# Patient Record
Sex: Female | Born: 1935 | Race: White | Hispanic: No | Marital: Married | Smoking: Never smoker
Health system: Southern US, Community
[De-identification: ages and names within clinical notes are randomized; demographics above are authoritative.]

## PROBLEM LIST (undated history)

## (undated) DIAGNOSIS — E538 Deficiency of other specified B group vitamins: Secondary | ICD-10-CM

## (undated) DIAGNOSIS — Z923 Personal history of irradiation: Secondary | ICD-10-CM

## (undated) DIAGNOSIS — I10 Essential (primary) hypertension: Secondary | ICD-10-CM

## (undated) DIAGNOSIS — R011 Cardiac murmur, unspecified: Secondary | ICD-10-CM

## (undated) DIAGNOSIS — K219 Gastro-esophageal reflux disease without esophagitis: Secondary | ICD-10-CM

## (undated) DIAGNOSIS — K635 Polyp of colon: Secondary | ICD-10-CM

## (undated) DIAGNOSIS — D649 Anemia, unspecified: Secondary | ICD-10-CM

## (undated) DIAGNOSIS — E042 Nontoxic multinodular goiter: Secondary | ICD-10-CM

## (undated) DIAGNOSIS — Z950 Presence of cardiac pacemaker: Secondary | ICD-10-CM

## (undated) DIAGNOSIS — M810 Age-related osteoporosis without current pathological fracture: Secondary | ICD-10-CM

## (undated) DIAGNOSIS — E785 Hyperlipidemia, unspecified: Secondary | ICD-10-CM

## (undated) DIAGNOSIS — T7840XA Allergy, unspecified, initial encounter: Secondary | ICD-10-CM

## (undated) DIAGNOSIS — K589 Irritable bowel syndrome without diarrhea: Secondary | ICD-10-CM

## (undated) DIAGNOSIS — M546 Pain in thoracic spine: Secondary | ICD-10-CM

## (undated) DIAGNOSIS — C50912 Malignant neoplasm of unspecified site of left female breast: Secondary | ICD-10-CM

## (undated) DIAGNOSIS — Z8719 Personal history of other diseases of the digestive system: Secondary | ICD-10-CM

## (undated) DIAGNOSIS — G8929 Other chronic pain: Secondary | ICD-10-CM

## (undated) DIAGNOSIS — I509 Heart failure, unspecified: Secondary | ICD-10-CM

## (undated) HISTORY — DX: Hyperlipidemia, unspecified: E78.5

## (undated) HISTORY — PX: BREAST SURGERY: SHX581

## (undated) HISTORY — DX: Allergy, unspecified, initial encounter: T78.40XA

## (undated) HISTORY — PX: AUGMENTATION MAMMAPLASTY: SUR837

## (undated) HISTORY — DX: Irritable bowel syndrome, unspecified: K58.9

## (undated) HISTORY — DX: Deficiency of other specified B group vitamins: E53.8

## (undated) HISTORY — DX: Gastro-esophageal reflux disease without esophagitis: K21.9

## (undated) HISTORY — DX: Anemia, unspecified: D64.9

## (undated) HISTORY — PX: BACK SURGERY: SHX140

## (undated) HISTORY — DX: Polyp of colon: K63.5

## (undated) HISTORY — PX: INSERT / REPLACE / REMOVE PACEMAKER: SUR710

## (undated) HISTORY — PX: TUBAL LIGATION: SHX77

## (undated) HISTORY — PX: REDUCTION MAMMAPLASTY: SUR839

## (undated) HISTORY — PX: KNEE ARTHROSCOPY: SHX127

## (undated) HISTORY — DX: Essential (primary) hypertension: I10

## (undated) HISTORY — DX: Heart failure, unspecified: I50.9

---

## 1976-07-28 DIAGNOSIS — C50912 Malignant neoplasm of unspecified site of left female breast: Secondary | ICD-10-CM

## 1976-07-28 HISTORY — PX: MASTECTOMY: SHX3

## 1976-07-28 HISTORY — DX: Malignant neoplasm of unspecified site of left female breast: C50.912

## 1979-07-29 HISTORY — PX: PLACEMENT OF BREAST IMPLANTS: SHX6334

## 1982-07-28 HISTORY — PX: EXCISIONAL HEMORRHOIDECTOMY: SHX1541

## 1998-04-25 ENCOUNTER — Other Ambulatory Visit: Admission: RE | Admit: 1998-04-25 | Discharge: 1998-04-25 | Payer: Self-pay | Admitting: *Deleted

## 1998-06-27 ENCOUNTER — Other Ambulatory Visit: Admission: RE | Admit: 1998-06-27 | Discharge: 1998-06-27 | Payer: Self-pay | Admitting: Gastroenterology

## 1998-06-27 ENCOUNTER — Encounter: Payer: Self-pay | Admitting: Gastroenterology

## 1998-06-29 ENCOUNTER — Encounter: Payer: Self-pay | Admitting: Gastroenterology

## 1998-06-29 ENCOUNTER — Ambulatory Visit (HOSPITAL_COMMUNITY): Admission: RE | Admit: 1998-06-29 | Discharge: 1998-06-29 | Payer: Self-pay | Admitting: Gastroenterology

## 1998-12-25 ENCOUNTER — Ambulatory Visit (HOSPITAL_COMMUNITY): Admission: RE | Admit: 1998-12-25 | Discharge: 1998-12-25 | Payer: Self-pay | Admitting: Obstetrics & Gynecology

## 1999-04-30 ENCOUNTER — Other Ambulatory Visit: Admission: RE | Admit: 1999-04-30 | Discharge: 1999-04-30 | Payer: Self-pay | Admitting: *Deleted

## 1999-07-25 ENCOUNTER — Encounter: Payer: Self-pay | Admitting: *Deleted

## 1999-07-25 ENCOUNTER — Encounter: Admission: RE | Admit: 1999-07-25 | Discharge: 1999-07-25 | Payer: Self-pay | Admitting: *Deleted

## 1999-07-29 HISTORY — PX: CARDIAC CATHETERIZATION: SHX172

## 2000-04-27 ENCOUNTER — Encounter: Admission: RE | Admit: 2000-04-27 | Discharge: 2000-04-27 | Payer: Self-pay | Admitting: *Deleted

## 2000-04-27 ENCOUNTER — Encounter: Payer: Self-pay | Admitting: *Deleted

## 2000-05-11 ENCOUNTER — Encounter: Admission: RE | Admit: 2000-05-11 | Discharge: 2000-05-11 | Payer: Self-pay | Admitting: *Deleted

## 2000-05-11 ENCOUNTER — Encounter: Payer: Self-pay | Admitting: *Deleted

## 2000-05-31 ENCOUNTER — Encounter: Payer: Self-pay | Admitting: *Deleted

## 2000-05-31 ENCOUNTER — Observation Stay (HOSPITAL_COMMUNITY): Admission: EM | Admit: 2000-05-31 | Discharge: 2000-06-01 | Payer: Self-pay | Admitting: *Deleted

## 2000-06-08 ENCOUNTER — Other Ambulatory Visit: Admission: RE | Admit: 2000-06-08 | Discharge: 2000-06-08 | Payer: Self-pay | Admitting: Obstetrics and Gynecology

## 2000-07-27 ENCOUNTER — Encounter: Payer: Self-pay | Admitting: Internal Medicine

## 2000-07-27 ENCOUNTER — Encounter: Admission: RE | Admit: 2000-07-27 | Discharge: 2000-07-27 | Payer: Self-pay | Admitting: Internal Medicine

## 2001-05-26 ENCOUNTER — Encounter: Payer: Self-pay | Admitting: Gastroenterology

## 2001-07-29 ENCOUNTER — Encounter: Admission: RE | Admit: 2001-07-29 | Discharge: 2001-07-29 | Payer: Self-pay | Admitting: Internal Medicine

## 2001-07-29 ENCOUNTER — Encounter: Payer: Self-pay | Admitting: Internal Medicine

## 2001-08-02 ENCOUNTER — Other Ambulatory Visit: Admission: RE | Admit: 2001-08-02 | Discharge: 2001-08-02 | Payer: Self-pay | Admitting: Obstetrics and Gynecology

## 2001-11-29 ENCOUNTER — Encounter: Payer: Self-pay | Admitting: Internal Medicine

## 2001-11-29 ENCOUNTER — Inpatient Hospital Stay (HOSPITAL_COMMUNITY): Admission: AD | Admit: 2001-11-29 | Discharge: 2001-12-01 | Payer: Self-pay | Admitting: Internal Medicine

## 2001-12-21 ENCOUNTER — Encounter: Payer: Self-pay | Admitting: Internal Medicine

## 2001-12-21 ENCOUNTER — Ambulatory Visit (HOSPITAL_COMMUNITY): Admission: RE | Admit: 2001-12-21 | Discharge: 2001-12-21 | Payer: Self-pay | Admitting: Internal Medicine

## 2002-03-30 ENCOUNTER — Encounter: Payer: Self-pay | Admitting: Internal Medicine

## 2002-03-30 ENCOUNTER — Encounter: Admission: RE | Admit: 2002-03-30 | Discharge: 2002-03-30 | Payer: Self-pay | Admitting: Internal Medicine

## 2002-08-05 ENCOUNTER — Encounter: Admission: RE | Admit: 2002-08-05 | Discharge: 2002-08-05 | Payer: Self-pay | Admitting: Internal Medicine

## 2002-08-05 ENCOUNTER — Encounter: Payer: Self-pay | Admitting: Internal Medicine

## 2002-09-28 ENCOUNTER — Other Ambulatory Visit: Admission: RE | Admit: 2002-09-28 | Discharge: 2002-09-28 | Payer: Self-pay | Admitting: Obstetrics and Gynecology

## 2003-09-11 ENCOUNTER — Encounter: Admission: RE | Admit: 2003-09-11 | Discharge: 2003-09-11 | Payer: Self-pay | Admitting: Internal Medicine

## 2004-08-04 ENCOUNTER — Encounter: Admission: RE | Admit: 2004-08-04 | Discharge: 2004-08-04 | Payer: Self-pay | Admitting: Orthopedic Surgery

## 2004-10-01 ENCOUNTER — Encounter: Admission: RE | Admit: 2004-10-01 | Discharge: 2004-10-01 | Payer: Self-pay | Admitting: Internal Medicine

## 2004-10-24 ENCOUNTER — Encounter: Admission: RE | Admit: 2004-10-24 | Discharge: 2004-10-24 | Payer: Self-pay | Admitting: Orthopedic Surgery

## 2004-12-11 ENCOUNTER — Ambulatory Visit: Payer: Self-pay | Admitting: Gastroenterology

## 2004-12-13 ENCOUNTER — Encounter (INDEPENDENT_AMBULATORY_CARE_PROVIDER_SITE_OTHER): Payer: Self-pay | Admitting: *Deleted

## 2004-12-13 ENCOUNTER — Ambulatory Visit: Payer: Self-pay | Admitting: Gastroenterology

## 2005-04-16 ENCOUNTER — Ambulatory Visit: Payer: Self-pay | Admitting: Gastroenterology

## 2005-09-02 ENCOUNTER — Encounter: Admission: RE | Admit: 2005-09-02 | Discharge: 2005-09-02 | Payer: Self-pay | Admitting: Neurology

## 2005-09-15 ENCOUNTER — Encounter: Admission: RE | Admit: 2005-09-15 | Discharge: 2005-10-06 | Payer: Self-pay | Admitting: Neurology

## 2005-10-15 ENCOUNTER — Encounter: Admission: RE | Admit: 2005-10-15 | Discharge: 2005-10-15 | Payer: Self-pay | Admitting: *Deleted

## 2005-10-29 ENCOUNTER — Encounter: Admission: RE | Admit: 2005-10-29 | Discharge: 2005-10-29 | Payer: Self-pay | Admitting: *Deleted

## 2005-11-20 ENCOUNTER — Encounter: Admission: RE | Admit: 2005-11-20 | Discharge: 2005-11-20 | Payer: Self-pay | Admitting: Orthopedic Surgery

## 2006-02-24 ENCOUNTER — Encounter: Admission: RE | Admit: 2006-02-24 | Discharge: 2006-02-24 | Payer: Self-pay | Admitting: Orthopedic Surgery

## 2006-02-25 HISTORY — PX: SHOULDER ARTHROSCOPY W/ ROTATOR CUFF REPAIR: SHX2400

## 2006-03-26 ENCOUNTER — Ambulatory Visit (HOSPITAL_COMMUNITY): Admission: RE | Admit: 2006-03-26 | Discharge: 2006-03-27 | Payer: Self-pay | Admitting: Orthopedic Surgery

## 2006-05-08 ENCOUNTER — Encounter: Admission: RE | Admit: 2006-05-08 | Discharge: 2006-05-08 | Payer: Self-pay | Admitting: Orthopedic Surgery

## 2006-06-17 ENCOUNTER — Encounter
Admission: RE | Admit: 2006-06-17 | Discharge: 2006-06-17 | Payer: Self-pay | Admitting: Physical Medicine and Rehabilitation

## 2006-07-16 ENCOUNTER — Ambulatory Visit: Payer: Self-pay | Admitting: Gastroenterology

## 2006-07-28 HISTORY — PX: ANTERIOR CERVICAL DECOMP/DISCECTOMY FUSION: SHX1161

## 2006-08-10 ENCOUNTER — Ambulatory Visit: Payer: Self-pay | Admitting: Gastroenterology

## 2006-08-18 ENCOUNTER — Ambulatory Visit: Payer: Self-pay | Admitting: Gastroenterology

## 2006-08-18 ENCOUNTER — Encounter (INDEPENDENT_AMBULATORY_CARE_PROVIDER_SITE_OTHER): Payer: Self-pay | Admitting: *Deleted

## 2006-08-18 LAB — HM COLONOSCOPY

## 2006-10-23 ENCOUNTER — Encounter: Admission: RE | Admit: 2006-10-23 | Discharge: 2006-10-23 | Payer: Self-pay | Admitting: *Deleted

## 2006-11-10 ENCOUNTER — Inpatient Hospital Stay (HOSPITAL_COMMUNITY): Admission: RE | Admit: 2006-11-10 | Discharge: 2006-11-11 | Payer: Self-pay | Admitting: Neurosurgery

## 2007-08-31 ENCOUNTER — Encounter: Admission: RE | Admit: 2007-08-31 | Discharge: 2007-08-31 | Payer: Self-pay | Admitting: *Deleted

## 2007-09-13 ENCOUNTER — Ambulatory Visit: Payer: Self-pay | Admitting: Vascular Surgery

## 2007-09-23 ENCOUNTER — Ambulatory Visit: Payer: Self-pay | Admitting: Gastroenterology

## 2007-11-09 ENCOUNTER — Encounter: Admission: RE | Admit: 2007-11-09 | Discharge: 2007-11-09 | Payer: Self-pay | Admitting: *Deleted

## 2007-11-19 ENCOUNTER — Encounter: Admission: RE | Admit: 2007-11-19 | Discharge: 2007-11-19 | Payer: Self-pay | Admitting: *Deleted

## 2007-12-08 ENCOUNTER — Encounter: Admission: RE | Admit: 2007-12-08 | Discharge: 2007-12-08 | Payer: Self-pay | Admitting: *Deleted

## 2007-12-08 ENCOUNTER — Encounter: Payer: Self-pay | Admitting: Internal Medicine

## 2008-02-07 ENCOUNTER — Telehealth: Payer: Self-pay | Admitting: Gastroenterology

## 2008-05-08 ENCOUNTER — Encounter: Admission: RE | Admit: 2008-05-08 | Discharge: 2008-05-08 | Payer: Self-pay | Admitting: *Deleted

## 2008-11-20 ENCOUNTER — Encounter: Admission: RE | Admit: 2008-11-20 | Discharge: 2008-11-20 | Payer: Self-pay | Admitting: *Deleted

## 2009-02-08 ENCOUNTER — Encounter: Payer: Self-pay | Admitting: Internal Medicine

## 2009-02-19 ENCOUNTER — Telehealth: Payer: Self-pay | Admitting: Gastroenterology

## 2009-02-28 ENCOUNTER — Encounter: Admission: RE | Admit: 2009-02-28 | Discharge: 2009-02-28 | Payer: Self-pay | Admitting: Neurosurgery

## 2009-03-06 DIAGNOSIS — Z860100 Personal history of colon polyps, unspecified: Secondary | ICD-10-CM | POA: Insufficient documentation

## 2009-03-08 ENCOUNTER — Ambulatory Visit: Payer: Self-pay | Admitting: Gastroenterology

## 2009-03-08 DIAGNOSIS — R141 Gas pain: Secondary | ICD-10-CM | POA: Insufficient documentation

## 2009-03-08 DIAGNOSIS — K5900 Constipation, unspecified: Secondary | ICD-10-CM | POA: Insufficient documentation

## 2009-03-08 DIAGNOSIS — R142 Eructation: Secondary | ICD-10-CM

## 2009-03-08 DIAGNOSIS — R110 Nausea: Secondary | ICD-10-CM | POA: Insufficient documentation

## 2009-03-08 DIAGNOSIS — R143 Flatulence: Secondary | ICD-10-CM

## 2009-03-09 ENCOUNTER — Ambulatory Visit (HOSPITAL_COMMUNITY): Admission: RE | Admit: 2009-03-09 | Discharge: 2009-03-09 | Payer: Self-pay | Admitting: Gastroenterology

## 2009-03-13 ENCOUNTER — Encounter: Payer: Self-pay | Admitting: Internal Medicine

## 2009-03-27 ENCOUNTER — Telehealth: Payer: Self-pay | Admitting: Internal Medicine

## 2009-04-17 ENCOUNTER — Ambulatory Visit: Payer: Self-pay | Admitting: Internal Medicine

## 2009-04-17 DIAGNOSIS — E785 Hyperlipidemia, unspecified: Secondary | ICD-10-CM | POA: Insufficient documentation

## 2009-04-17 DIAGNOSIS — M810 Age-related osteoporosis without current pathological fracture: Secondary | ICD-10-CM | POA: Insufficient documentation

## 2009-04-17 DIAGNOSIS — Z853 Personal history of malignant neoplasm of breast: Secondary | ICD-10-CM | POA: Insufficient documentation

## 2009-04-17 DIAGNOSIS — E538 Deficiency of other specified B group vitamins: Secondary | ICD-10-CM | POA: Insufficient documentation

## 2009-04-18 ENCOUNTER — Telehealth: Payer: Self-pay | Admitting: Internal Medicine

## 2009-04-18 LAB — CONVERTED CEMR LAB
Cholesterol: 251 mg/dL — ABNORMAL HIGH (ref 0–200)
Folate: 18.4 ng/mL
HDL: 57.5 mg/dL (ref >39.00)
Triglycerides: 136 mg/dL (ref 0.0–149.0)
VLDL: 27.2 mg/dL (ref 0.0–40.0)

## 2009-07-09 ENCOUNTER — Ambulatory Visit: Payer: Self-pay | Admitting: Internal Medicine

## 2009-08-28 ENCOUNTER — Ambulatory Visit: Payer: Self-pay | Admitting: Internal Medicine

## 2009-08-28 LAB — CONVERTED CEMR LAB: Vitamin B-12: >1500 pg/mL — ABNORMAL HIGH (ref 211–911)

## 2009-09-03 ENCOUNTER — Telehealth: Payer: Self-pay | Admitting: Internal Medicine

## 2009-09-28 ENCOUNTER — Ambulatory Visit: Payer: Self-pay | Admitting: Internal Medicine

## 2009-09-28 DIAGNOSIS — R5383 Other fatigue: Secondary | ICD-10-CM | POA: Insufficient documentation

## 2009-09-28 DIAGNOSIS — R5381 Other malaise: Secondary | ICD-10-CM | POA: Insufficient documentation

## 2009-09-28 LAB — CONVERTED CEMR LAB
ALT: 14 units/L (ref 0–35)
BUN: 14 mg/dL (ref 6–23)
Basophils Absolute: 0.1 10*3/uL (ref 0.0–0.1)
Basophils Relative: 0.9 % (ref 0.0–3.0)
Bilirubin, Direct: 0.1 mg/dL (ref 0.0–0.3)
CO2: 29 meq/L (ref 19–32)
Chloride: 104 meq/L (ref 96–112)
Creatinine, Ser: 0.8 mg/dL (ref 0.4–1.2)
Eosinophils Absolute: 0.1 10*3/uL (ref 0.0–0.7)
Glucose, Bld: 101 mg/dL — ABNORMAL HIGH (ref 70–99)
Lymphocytes Relative: 26.8 % (ref 12.0–46.0)
MCHC: 33.6 g/dL (ref 30.0–36.0)
Neutrophils Relative %: 59.5 % (ref 43.0–77.0)
RBC: 3.88 M/uL (ref 3.87–5.11)
Total Protein: 7.1 g/dL (ref 6.0–8.3)

## 2009-11-22 ENCOUNTER — Encounter: Admission: RE | Admit: 2009-11-22 | Discharge: 2009-11-22 | Payer: Self-pay | Admitting: Internal Medicine

## 2009-12-10 ENCOUNTER — Encounter: Payer: Self-pay | Admitting: Internal Medicine

## 2009-12-10 ENCOUNTER — Ambulatory Visit: Payer: Self-pay | Admitting: Internal Medicine

## 2010-01-08 ENCOUNTER — Ambulatory Visit: Payer: Self-pay | Admitting: Internal Medicine

## 2010-01-08 LAB — CONVERTED CEMR LAB
Cholesterol: 263 mg/dL — ABNORMAL HIGH (ref 0–200)
Total CHOL/HDL Ratio: 4
Triglycerides: 92 mg/dL (ref 0.0–149.0)

## 2010-03-04 ENCOUNTER — Ambulatory Visit: Payer: Self-pay | Admitting: Internal Medicine

## 2010-03-04 DIAGNOSIS — R079 Chest pain, unspecified: Secondary | ICD-10-CM | POA: Insufficient documentation

## 2010-07-02 ENCOUNTER — Ambulatory Visit: Payer: Self-pay | Admitting: Internal Medicine

## 2010-07-09 ENCOUNTER — Ambulatory Visit: Payer: Self-pay | Admitting: Internal Medicine

## 2010-07-23 ENCOUNTER — Ambulatory Visit: Payer: Self-pay | Admitting: Internal Medicine

## 2010-07-23 DIAGNOSIS — R040 Epistaxis: Secondary | ICD-10-CM | POA: Insufficient documentation

## 2010-08-18 ENCOUNTER — Encounter: Payer: Self-pay | Admitting: Physical Medicine and Rehabilitation

## 2010-08-18 ENCOUNTER — Encounter: Payer: Self-pay | Admitting: Unknown Physician Specialty

## 2010-08-19 ENCOUNTER — Encounter: Payer: Self-pay | Admitting: Internal Medicine

## 2010-08-28 ENCOUNTER — Encounter: Payer: Self-pay | Admitting: Internal Medicine

## 2010-08-28 ENCOUNTER — Ambulatory Visit (INDEPENDENT_AMBULATORY_CARE_PROVIDER_SITE_OTHER): Payer: Medicare Other | Admitting: Internal Medicine

## 2010-08-28 DIAGNOSIS — J209 Acute bronchitis, unspecified: Secondary | ICD-10-CM

## 2010-08-29 NOTE — Assessment & Plan Note (Signed)
Summary: fell and hit ribs-lb   Vital Signs:  Patient profile:   76 year old female Height:      62.5 inches (158.75 cm) Weight:      160 pounds (72.73 kg) O2 Sat:      97 % on Room air Temp:     97.6 degrees F (36.44 degrees C) oral Pulse rate:   67 / minute BP sitting:   142 / 72  (right arm) Cuff size:   regular  Vitals Entered By: Orlan Leavens RMA (March 04, 2010 11:28 AM)  O2 Flow:  Room air CC: (R) side & ribs hurt Is Patient Diabetic? No Pain Assessment Patient in pain? yes     Location: (R) side & ribs Type: sore/aching Comments Pt states she fell on  Thursday. went to sit on corner of bed and fell on her (R) side. since then ribs has been hurting   Primary Care Provider:  Newt Lukes MD  CC:  (R) side & ribs hurt.  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 Russella Dar for same better when taking nexium -  but trying not to take nexium b/c concerned this pill blocking b12 absorbtion or causing bone loss- occ use of "lower dose prilosec 20mg " if needed and nexium only if "real bad" using Tums too and ?if this  blocks absorbtion   b12 defic - dx 02/08/09 by lab = 178 at [facility] (labs reviewed) has not had shot - no pernicious anemia per GI (per pt)  hx dyslipidemia- adv reaction to lipitor in past requiring hospitalization (?rhabdo) prev taking fish oil and red Torsha Lemus yeast but has stopped all meds few months ago to try to improve b12 absorbtion - now takes intermittently  osteoporosis hx- no personal fx hx no bone or back or joint pain at this time bone scan reviewed from 2009 and 12/21/09 - rec prolia q 6 mo - pt declines due to risk  Current Medications (verified): 1)  Nexium 40 Mg  Cpdr (Esomeprazole Magnesium) .... One Tablet By Mouth Once Daily 2)  Aspir-Low 81 Mg Tbec (Aspirin) .... Take 1 Tablet By Mouth Once A Day 3)  Vitamin B-12 1000 Mcg Tabs (Cyanocobalamin) .... Take 2 Tablet By Mouth Once A Day 4)  Miralax  Powd (Polyethylene Glycol 3350) .... Take 17 Grams Daily in 8 Oz Water 5)  Calcium 500 Mg Tabs (Calcium Carbonate) .... Take 1 Three Times A Day 6)  Vitamin D 1000 Unit Caps (Cholecalciferol) .[name].. 1 By Mouth Once Daily 7)  Prolia 60 Mg/ml Soln (Denosumab) .... Inject Every 6 Months Hold 8)  Tums E-X 750 750 Mg Chew (Calcium Carbonate Antacid) .... 2 Once Daily 9)  Super Probiotic  Caps (Probiotic Product) .... Take 1 By Mouth Once Daily  Allergies (verified): 1)  ! * Flu Vaccination  Past History:  Past Medical History: GERD Hx of Breast Cancer - 1978 Hyperplastic polyps 06/1998 Internal Hemorrhoids Osteoporosis Hyperlipidemia B12 deficinency  MD roster: gyn-Cousins  GI-Stark neuro - [facility] neurosurg - [facility]sch  Review of Systems  The patient denies fever, syncope, peripheral edema, headaches, hemoptysis, and abdominal pain.    Physical Exam  General:  alert, well-developed, well-nourished, and cooperative to examination.    Chest Wall:  mild brusing linear pattern posterior right chest wall, tender to deep palp Lungs:  normal respiratory effort, no  intercostal retractions or use of accessory muscles; normal breath sounds bilaterally - no crackles and no wheezes.    Heart:  normal rate, regular rhythm, 2/6 murmur, and no rub. BLE without edema   Impression & Recommendations:  Problem # 1:  RIB PAIN, RIGHT SIDED (ICD-786.50) s/p accidental trauma 4 days ago -  given hx osteoporosis, r/o fx - xray now reassurance provided - cont NSAIDs and heat pad as needed  Orders: T-Ribs Unilateral 2 Views (71100TC)  Complete Medication List: 1)  Nexium 40 Mg Cpdr (Esomeprazole magnesium) .... One tablet by mouth once daily 2)  Aspir-low 81 Mg Tbec (Aspirin) .... Take 1 tablet by mouth once a day 3)  Vitamin B-12 1000 Mcg Tabs (Cyanocobalamin) .... Take 2 tablet by mouth once a day 4)  Miralax Powd (Polyethylene glycol 3350) .... Take 17 grams daily in 8 oz water 5)  Calcium 500 Mg Tabs (Calcium carbonate) .... Take 1 three times a day 6)  Vitamin D 1000 Unit Caps (Cholecalciferol) .[name].. 1 by mouth once daily 7)  Prolia 60 Mg/ml Soln (Denosumab) .... Inject every 6 months hold 8)  Tums E-x 750 750 Mg Chew (Calcium carbonate antacid) .... 2 once daily 9)  Super Probiotic Caps (Probiotic product) .... Take 1 by mouth once daily  Patient Instructions: 1)  it was good to see you today. 2)  xray ordered today - your results will be called to you after review 3)  Take NSAIDs like Aleve, ibuprofen, Advil, or Motrin with food as needed for relief of pain or comfort of fever. use warm heating pad three times a day as needed for relief of tightness and soreness

## 2010-08-29 NOTE — Assessment & Plan Note (Signed)
Summary: POST ER/ NOSE BLEEDS/NWS  #   Vital Signs:  Patient profile:   76 year old female Height:      62.5 inches (158.75 cm) Weight:      160 pounds (72.73 kg) O2 Sat:      97 % on Room air Temp:     98.6 degrees F (37.00 degrees C) oral Pulse rate:   72 / minute BP sitting:   152 / 72  (left arm) Cuff size:   regular  Vitals Entered By: Orlan Leavens RMA (July 23, 2010 11:03 AM)  O2 Flow:  Room air CC: ER Follow-up (Nose bleeds) Is Patient Diabetic? No Pain Assessment Patient in pain? no        Primary Care Provider:  Newt Lukes MD  CC:  ER Follow-up (Nose bleeds).  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 Russella Dar for same better when taking nexium -  but trying not to take nexium b/c concerned this pill blocking b12 absorbtion or causing bone loss- occ use of "lower dose prilosec 20mg " if needed and nexium only if "real bad" using Tums too and ?if this blocks absorbtion   b12 defic - dx 02/08/09 by lab = 178 at [facility] (labs reviewed) has not had shot - no pernicious anemia per GI (per pt)  hx dyslipidemia- adv reaction to lipitor in past requiring hospitalization (?rhabdo) prev taking fish oil and red rice  yeast but has stopped all meds few months ago to try to improve b12 absorbtion - now takes intermittently  osteoporosis hx- no personal fx hx no bone or back or joint pain at this time bone scan reviewed from 2009 and 12/21/09 - rec prolia q 6 mo - pt declines due to risk  Current Medications (verified): 1)  Nexium 40 Mg  Cpdr (Esomeprazole Magnesium) .... One Tablet By Mouth Once Daily As Needed 2)  Aspir-Low 81 Mg Tbec (Aspirin) .... Take 1 Tablet By Mouth Once A Day 3)  Vitamin B-12 1000 Mcg Tabs (Cyanocobalamin) .... Take 2 Tablet By Mouth Once A Day 4)  Miralax  Powd (Polyethylene Glycol 3350) .... Take 17 Grams Daily in 8 Oz Water 5)  Vitamin D 1000 Unit Caps (Cholecalciferol) .[name].. 1 By Mouth Once Daily 6)  Tums E-X 750 750 Mg Chew (Calcium Carbonate Antacid) .... 2 Once Daily 7)  Super Probiotic  Caps (Probiotic Product) .... Take 1 By Mouth Once Daily  Allergies (verified): 1)  ! * Flu Vaccination  Past History:  Past Medical History: GERD Hx of Breast Cancer - 1978  Hyperplastic polyps 06/1998 Internal Hemorrhoids Osteoporosis Hyperlipidemia B12 deficinency  MD roster: gyn-Cousins  GI-Stark neuro -  [facility] neurosurg - Phoebe Perch  Review of Systems  The patient denies hoarseness, chest pain, headaches, hemoptysis, and abdominal pain.    Physical Exam  General:  alert, well-developed, well-nourished, and cooperative to examination.   nontoxic Nose:  small ulceration, nonbleeding on septum of  left nostril - otherwise normal L and right side Mouth:  teeth and gums in good repair; mucous membranes moist, without lesions or ulcers. oropharynx clear without exudate, no erythema.  Lungs:  normal respiratory effort, no intercostal retractions or use of accessory muscles; normal breath sounds bilaterally - no crackles and no wheezes.    Heart:  normal rate, regular rhythm, no murmur, and no rub. BLE without edema.    Impression & Recommendations:  Problem # 1:  EPISTAXIS  (ICD-784.7) ER visit reviewed (in [place], [place]) no recurrence - educated to keep mosturized and ok to resume asa 81mg  - to consider ENT if recurrent symptoms for cauterization  Complete Medication List: 1)  Nexium 40 Mg Cpdr (Esomeprazole magnesium) .... One tablet by mouth once daily as needed 2)  Aspir-low 81 Mg Tbec (Aspirin) .... Take 1 tablet by mouth once a day 3)  Vitamin B-12 1000 Mcg Tabs (Cyanocobalamin) .... Take 2 tablet by mouth once a day 4)  Miralax Powd (Polyethylene glycol 3350) .... Take 17 grams daily in 8 oz water 5)  Vitamin D 1000 Unit Caps (Cholecalciferol) .[name].. 1 by mouth once daily 6)  Tums E-x 750 750 Mg Chew (Calcium carbonate antacid) .... 2 once daily 7)  Super Probiotic Caps (Probiotic product) .... Take 1 by mouth once daily  Patient Instructions: 1)  it was good to see you today. 2)  nose looks ok today - keep mosturized with vasoline and hydrate with nasal saline spray every day 3)  ok to resume low dose aspirin now 4)  Please schedule a follow-up appointment in March 2012 for physical and labs, call sooner if problems.    Orders Added: 1)  Est. Patient Level III [81191]

## 2010-08-29 NOTE — Assessment & Plan Note (Signed)
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 also pt concern about ? rash/spots  on stomach Is Patient Diabetic? No Pain Assessment Patient in pain? no        Primary Care Provider:  Newt Lukes MD  CC:  weak/ nasuated. also pt concern about ? rash/spots  on stomach.  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 Russella Dar for same better when taking nexium -  but trying not to take nexium b/c concerned this pill blocking b12 absorbtion or causing bone loss- occ use of "lower dose prilosec 20mg " if needed and nexium only if "real  bad" using Tums too and ?if this blocks absorbtion   b12 defic - dx 02/08/09 by lab = 178 at [facility] (labs reviewed) has not had shot - no pernicious anemia per GI (per pt)  hx dyslipidemia- adv reaction to lipitor in past requiring hospitalization (?rhabdo) prev taking fish oil and red rice yeast but has stopped all meds few months ago to try to improve b12 absorbtion - now takes intermittently  osteoporosis hx- no personal fx hx no bone or back or joint pain at this time bone scan reviewed from 2009  Current Medications (verified): 1)  Nexium 40 Mg  Cpdr (Esomeprazole Magnesium) .... One Tablet By Mouth Once Daily 2)  Aspir-Low 81 Mg Tbec (Aspirin) .... Take 1 Tablet By Mouth Once A Day 3)  Vitamin B-12 1000 Mcg Tabs (Cyanocobalamin) .... Take 2 Tablet By Mouth Once A Day 4)  Miralax  Powd (Polyethylene Glycol 3350) .... Take 17 Grams Daily in 8 Oz Water 5)  Calcium 500 Mg Tabs (Calcium Carbonate) .... Take 1 Three Times A Day 6)  One-A-Day Womens Formula  Tabs (Multiple Vitamins-Calcium) .... Take 1 By Mouth Qd 7)  Red Yeast Rice 600 Mg Caps (Red Yeast Rice Extract) .... Take 2 By Mouth Qd 8)  Fish Oil 1200 Mg Caps (Omega-3 Fatty Acids) .... Take 2 By Mouth Qd 9)  Vitamin D 1000 Unit Caps (Cholecalciferol) .[name].. 1 By Mouth Once Daily  Allergies (verified): 1)  ! * Flu Vaccination  Past History:  Past Medical History: Reviewed history from 07/09/2009 and no changes required. GERD Hx of Breast Cancer - 1978 Hyperplastic polyps 06/1998 Internal Hemorrhoids Osteoporosis Hyperlipidemia B12 deficinency  MD rooster - gyn-Cousins  GI-Stark neuro - [facility] neurosurg - [facility]sch  Review of Systems       The patient complains of anorexia.  The patient denies fever, weight loss, chest pain, dyspnea on exertion, and headaches.    Physical Exam  General:  alert, well-developed, well-nourished, and cooperative to examination.    Lungs:  normal respiratory effort, no intercostal  retractions or use of accessory muscles; normal breath sounds bilaterally - no crackles and no wheezes.    Heart:  normal rate, regular rhythm, no murmur, and no rub. BLE without edema Abdomen:  soft, non-tender, normal bowel sounds, no distention; no masses and no appreciable hepatomegaly or splenomegaly.   Skin:  numerous cherry hemangiomas (1-2 mm round each) along lower abd bilaterally -    Impression & Recommendations:  Problem # 1:  NAUSEA (ICD-787.02) exam benign -  check labs - as needed phenergan and add probiotic - f/u GI as needed  Orders: TLB-BMP (Basic Metabolic Panel-BMET) (80048-METABOL) TLB-Hepatic/Liver Function Pnl (80076-HEPATIC) TLB-Cortisol (82533-CORT) Prescription Created Electronically [phone])  Discussed symptom control.   Problem # 2:  FATIGUE (ICD-780.79) see above - Orders: TLB-CBC Platelet - w/Differential (85025-CBCD) TLB-Cortisol (82533-CORT)  Complete Medication List: 1)  Nexium 40 Mg Cpdr (Esomeprazole magnesium) .... One tablet by mouth once daily 2)  Aspir-low 81 Mg Tbec (Aspirin) .... Take 1 tablet by mouth once a day 3)  Vitamin B-12 1000 Mcg Tabs (Cyanocobalamin) .... Take 2 tablet by mouth once a day 4)  Miralax Powd (Polyethylene glycol 3350) .... Take 17 grams daily in 8 oz water 5)  Calcium 500 Mg Tabs (Calcium carbonate) .... Take 1 three times a day 6)  One-a-day Womens Formula Tabs (Multiple vitamins-calcium) .... Take 1 by mouth qd 7)  Red Yeast Rice 600 Mg Caps (Red yeast rice extract) .... Take 2 by mouth qd 8)  Fish Oil 1200 Mg Caps (Omega-3 fatty acids) .... Take 2 by mouth qd 9)  Vitamin D 1000 Unit Caps (Cholecalciferol) .[name].. 1 by mouth once daily 10)  Promethazine Hcl 12.5 Mg Tabs (Promethazine hcl) .... 1/2-1 tab by mouth every 4 hours as needed for nausea  Patient Instructions: 1)  it was good to see you today. 2)  test(s) ordered today - your results will be posted on the phone tree for review in 48-72 hours from the time  of test completion; call [phone] and enter your 9 digit MRN (listed above on this page, just below your name); if any changes need to be made or there are abnormal results, you will be contacted directly.  3)  may try low dose phenergan as needed for nuasea symptoms  -your prescription has been electronically submitted to your pharmacy. Please take as directed. Contact our office if you believe you're having problems with the medication(s).  4)  continue taking Activia - or may try other probiotic supplements like Align or Florastor for your stomach (available at the pharmacy without prescription) 5)  keep hydrated - drink 3- 4 glasses of water (8oz/glass) each day 6)  there is nothing bad about your stomach  spots - 7)  Please keep follow-up appointment as previously scheduled, sooner if problems.  Prescriptions: PROMETHAZINE HCL 12.5 MG TABS (PROMETHAZINE HCL) 1/2-1 tab by mouth every 4 hours as needed for nausea  #30 x 0   Entered and Authorized by:   Newt Lukes MD   Signed by:   Newt Lukes MD on 09/28/2009   Method used:   Electronically to        [facility]* (retail)       [address]       [place], [place]  [id number]       Ph: [phone]       Fax: [phone]   RxID:   [id number]

## 2010-08-29 NOTE — Letter (Signed)
Summary: Primary Care Appointment Letter  [facility]  [address] [place], [place] [address]   Phone: [phone]  Fax: [phone]    08/19/2010 MRN: [id number]  [name] [address] [place], [place]  [address]  Dear [name],   Your Primary Care Physician Newt Lukes MD has indicated that:    _______it is time to schedule an appointment.    _______you missed your appointment on______ and need to call and          reschedule.    _______you need to have lab work done.    _______you need to schedule an appointment discuss lab or test results.    ___X____you need to call to reschedule your appointment that is                       scheduled on October 08, 2010 with Dr. Felicity Coyer for a physical. Please call the office.    Please call our office as soon as possible. Our phone number is [phone]. Please press option 1. Our office is open 8a-12noon and 1p-5p, Monday through Friday.     Thank you,    [facility]

## 2010-08-29 NOTE — Assessment & Plan Note (Signed)
Summary: FU / NWS #   Vital Signs:  Patient profile:   76 year old female Height:      62.5 inches (158.75 cm) Weight:      160.4 pounds (72.91 kg) BMI:     28.97 O2 Sat:      98 % on Room air Temp:     97.0 degrees F (36.11 degrees C) oral Pulse rate:   70 / minute BP sitting:   148 / 62  (right arm) Cuff size:   regular  Vitals Entered By: Orlan Leavens (January 08, 2010 8:26 AM)  O2 Flow:  Room air CC: follow-up visit Is Patient Diabetic? No Pain Assessment Patient in pain? no        Primary Care Provider:  Newt Lukes MD  CC:  follow-up visit.  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 Russella Dar for same better when taking nexium -  but trying not to take nexium b/c concerned this pill blocking b12 absorbtion or causing bone loss- occ use of "lower dose prilosec 20mg " if needed and nexium only if "real bad" using Tums too and ?if this blocks absorbtion   b12 defic - dx 02/08/09 by lab = 178 at [facility] (labs  reviewed) has not had shot - no pernicious anemia per GI (per pt)  hx dyslipidemia- adv reaction to lipitor in past requiring hospitalization (?rhabdo) prev taking fish oil and red rice yeast but has stopped all meds few months ago to try to improve b12 absorbtion - now takes intermittently  osteoporosis hx- no personal fx hx no bone or back or joint pain at this time bone scan reviewed from 2009 and 12/21/09 - rec prolia q 6 mo - pt declines due to risk  Clinical Review Panels:  Prevention   Last Mammogram:  BI-RADS CATEGORY 2:  Benign finding(s).^MM DIGITAL DIAGNOSTIC UNILAT R (11/22/2009)   Last Pap Smear:   Normal (04/17/2009)   Last Colonoscopy:  Location:  [facility].  Results: Hemorrhoids.     Pathology:  Hyperplastic polyp.      (08/18/2006)  Immunizations   Last Tetanus Booster:  Historical (07/28/2000)   Last Flu Vaccine:  Fluvax 3+ (07/09/2009)   Last Pneumovax:  Historical (07/28/2000)  Lipid Management   Cholesterol:  251 (04/17/2009)   HDL (good cholesterol):  57.50 (04/17/2009)  CBC   WBC:  6.1 (09/28/2009)   RBC:  3.88 (09/28/2009)   Hgb:  12.9 (09/28/2009)  Hct:  38.5 (09/28/2009)   Platelets:  314.0 (09/28/2009)   MCV  99.2 (09/28/2009)   MCHC  33.6 (09/28/2009)   RDW  12.7 (09/28/2009)   PMN:  59.5 (09/28/2009)   Lymphs:  26.8 (09/28/2009)   Monos:  10.8 (09/28/2009)   Eosinophils:  2.0 (09/28/2009)   Basophil:  0.9 (09/28/2009)  Complete Metabolic Panel   Glucose:  101 (09/28/2009)   Sodium:  139 (09/28/2009)   Potassium:  4.7 (09/28/2009)   Chloride:  104 (09/28/2009)   CO2:  29 (09/28/2009)   BUN:  14 (09/28/2009)   Creatinine:  0.8 (09/28/2009)   Albumin:  4.2 (09/28/2009)   Total Protein:  7.1 (09/28/2009)   Calcium:  9.6 (09/28/2009)   Total Bili:  0.5 (09/28/2009)   Alk Phos:  51 (09/28/2009)   SGPT (ALT):  14 (09/28/2009)   SGOT (AST):  17 (09/28/2009)   Current Medications (verified): 1)  Nexium 40 Mg  Cpdr  (Esomeprazole Magnesium) .... One Tablet By Mouth Once Daily 2)  Aspir-Low 81 Mg Tbec (Aspirin) .... Take 1 Tablet By Mouth Once A Day 3)  Vitamin B-12 1000 Mcg Tabs (Cyanocobalamin) .... Take 2 Tablet By Mouth Once A Day 4)  Miralax  Powd (Polyethylene Glycol 3350) .... Take 17 Grams Daily in 8 Oz Water 5)  Calcium 500 Mg Tabs (Calcium Carbonate) .... Take 1 Three Times A Day 6)  Vitamin D 1000 Unit Caps (Cholecalciferol) .[name].. 1 By Mouth Once Daily 7)  Prolia 60 Mg/ml Soln (Denosumab) .... Inject Every 6 Months Hold 8)  Tums E-X 750 750 Mg Chew (Calcium Carbonate Antacid) .... 2 Once Daily 9)  Super Probiotic  Caps (Probiotic Product) .... Take 1 By Mouth Once Daily  Allergies (verified): 1)  ! * Flu Vaccination  Past History:  Past Medical History: GERD Hx of Breast Cancer - 1978 Hyperplastic polyps 06/1998 Internal Hemorrhoids Osteoporosis Hyperlipidemia B12 deficinency  MD roster - gyn-Cousins  GI-[facility]ark neuro - [facility] neurosurg - Hirsch  Review of Systems  The patient denies anorexia, weight loss, chest pain, syncope, and headaches.    Physical Exam  General:  alert, well-developed, well-nourished, and cooperative to examination.    Lungs:  normal respiratory effort, no intercostal retractions or use of accessory muscles; normal breath sounds bilaterally - no crackles and no wheezes.    Heart:  normal rate, regular rhythm, 2/6 murmur, and no rub. BLE without edema Psych:  Oriented X3 but pressured speech, memory intact for recent and remote, normally and pleasant interactive, good eye contact, mildly anxious appearing, not depressed appearing, and not agitated.      Impression & Recommendations:  Problem # 1:  OSTEOPOROSIS (ICD-733.00) 11/2007 and 11/2009 reveiwed - sig spine osteoporsis but not sig decline in 2 yrs - pt declines prolia at this time - cont vit d + ca Her updated medication list for this problem includes:    Prolia 60 Mg/ml Soln (Denosumab) ..... Inject  every 6 months hold  Problem # 2:  DYSLIPIDEMIA (ICD-272.4)  intol of statins - cont fish oil + red rice yeast  Labs Reviewed: SGOT: 17 (09/28/2009)   SGPT: 14 (09/28/2009)   HDL:57.50 (04/17/2009)  Chol:251 (04/17/2009)  Trig:136.0 (04/17/2009)  Orders: TLB-Lipid Panel (80061-LIPID)  Problem # 3:  GERD (ICD-530.81)  Her updated medication list for this problem includes:    Nexium 40 Mg Cpdr (Esomeprazole magnesium) ..... One tablet by mouth once daily    Tums E-x 750 750 Mg Chew (Calcium carbonate antacid) .Marland KitchenMarland KitchenMarland KitchenMarland Kitchen 2  once daily  EGD: Location: [facility]   (12/13/2004)  Labs Reviewed: Hgb: 12.9 (09/28/2009)   Hct: 38.5 (09/28/2009)  Problem # 4:  VITAMIN B12 DEFICIENCY (ICD-266.2)  pt concerned she's not absorbing the med -  levels reviewed from last labs  advised to change positions and avid long trips/sitting that could aggrevate compressive neuropathy symptoms  declines med tx such as neurontin at this time  Complete Medication List: 1)  Nexium 40 Mg Cpdr (Esomeprazole magnesium) .... One tablet by mouth once daily 2)  Aspir-low 81 Mg Tbec (Aspirin) .... Take 1 tablet by mouth once a day 3)  Vitamin B-12 1000 Mcg Tabs (Cyanocobalamin) .... Take 2 tablet by mouth once a day 4)  Miralax Powd (Polyethylene glycol 3350) .... Take 17 grams daily in 8 oz water 5)  Calcium 500 Mg Tabs (Calcium carbonate) .... Take 1 three times a day 6)  Vitamin D 1000 Unit Caps (Cholecalciferol) .[name].. 1 by mouth once daily 7)  Prolia 60 Mg/ml Soln (Denosumab) .... Inject every 6 months hold 8)  Tums E-x 750 750 Mg Chew (Calcium carbonate antacid) .... 2 once daily 9)  Super Probiotic Caps (Probiotic product) .... Take 1 by mouth once daily  Patient Instructions: 1)  it was good to see you today. 2)  test(s) ordered today - your results will be posted on the phone tree for review in 48-72 hours from the time of test completion; call [phone] and enter your 9 digit MRN (listed  above on this page, just below your name); if any changes need to be made or there are abnormal results, you will be contacted directly.  3)  continue taking Activia and other probiotic supplements like Comptroller for your stomach (available at the pharmacy without prescription) 4)  keep hydrated - drink 3- 4 glasses of water (8oz/glass) each day 5)  continue calcium + vit D as discussed and think about possible prolia injections for your bones 6)  Please keep follow-up appointment 6 months, sooner if problems.

## 2010-08-29 NOTE — Assessment & Plan Note (Signed)
Summary: FLU VAC--VL---STC   Nurse Visit   Allergies: 1)  ! * Flu Vaccination  Orders Added: 1)  Flu Vaccine 63yrs + MEDICARE PATIENTS [Q2039] 2)  Administration Flu vaccine - MCR [G0008] Flu Vaccine Consent Questions     Do you have a history of severe allergic reactions to this vaccine? no    Any prior history of allergic reactions to egg and/or gelatin? no    Do you have a sensitivity to the preservative Thimersol? no    Do you have a past history of Guillan-Barre Syndrome? no    Do you currently have an acute febrile illness? no    Have you ever had a severe reaction to latex? no    Vaccine information given and explained to patient? yes    Are you currently pregnant? no    Lot Number:[id number]   Exp Date:01/25/2011   Site Given  Right Deltoid IM

## 2010-08-29 NOTE — Miscellaneous (Signed)
Summary: BONE DENSITY  Clinical Lists Changes  Orders: Added new Test order of T-Lumbar Vertebral Assessment (77082) - Signed 

## 2010-08-29 NOTE — Assessment & Plan Note (Signed)
Summary: LEG PAIN-LB   Vital Signs:  Patient profile:   76 year old female Height:      62.5 inches (158.75 cm) Weight:      160 pounds (72.73 kg) O2 Sat:      98 % on Room air Temp:     98.1 degrees F (36.72 degrees C) oral Pulse rate:   75 / minute BP sitting:   148 / 62  (right arm) Cuff size:   regular  Vitals Entered By: Orlan Leavens (August 28, 2009 3:35 PM)  O2 Flow:  Room air CC: Pain in both legs Is Patient Diabetic? No Pain Assessment Patient in pain? yes     Location: both legs Type: aching/throbbing   Primary Care Provider:  Newt Lukes MD  CC:  Pain in both legs.  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 Russella Dar for same better when taking nexium -  but trying not to take nexium b/c concerned this pill blocking b12 absorbtion occ use of "lower does prilosec 20mg " if needed and nexium only if "real bad" using Tums too and ?if this blocks absorbtion  also ?s if needs addl vit D since not taking Ca pills when on tums  b12 defic - dx 02/08/09 by lab = 178 at [facility] (labs reviewed) has not had shot - no pernicious anemia per GI (per pt)  hx dyslipidemia- adv reaction to lipitor in past requiring hospitalization (?rhabdo) prev taking fish oil and red rice yeast but has stopped  all meds few months ago to try to improve b12 absorbtion - now takes intermittently  osteoporosis hx due for bone density screening due 2011 no personal fx hx no bone or back or joint pain at this time bone scan reviewed from 2009  Current Medications (verified): 1)  Nexium 40 Mg  Cpdr (Esomeprazole Magnesium) .... One Tablet By Mouth Once Daily 2)  Aspir-Low 81 Mg Tbec (Aspirin) .... Take 1 Tablet By Mouth Once A Day 3)  Vitamin B-12 1000 Mcg Tabs (Cyanocobalamin) .... Take 2 Tablet By Mouth Once A Day 4)  Miralax  Powd (Polyethylene Glycol 3350) .... Take 17 Grams Daily in 8 Oz Water 5)  Calcium 500 Mg Tabs (Calcium Carbonate) .... Take 1 Three Times A Day 6)  One-A-Day Womens Formula  Tabs (Multiple Vitamins-Calcium) .... Take 1 By Mouth Qd 7)  Red Yeast Rice 600 Mg Caps (Red Yeast Rice Extract) .... Take 2 By Mouth Qd 8)  Fish Oil 1200 Mg Caps (Omega-3 Fatty Acids) .... Take 2 By Mouth Qd  Allergies (verified): 1)  ! * Flu Vaccination  Past History:  Past Medical History: Last updated: 07/09/2009 GERD Hx of Breast Cancer - 1978 Hyperplastic polyps 06/1998  Internal Hemorrhoids Osteoporosis Hyperlipidemia B12 deficinency  MD rooster - gyn-Cousins  GI-[facility] neuro - [facility] neurosurg - [facility]sch  Review of Systems  The patient denies fever, chest pain, syncope, dyspnea on exertion, peripheral edema, headaches, and difficulty walking.    Physical Exam  General:  alert, well-developed, well-nourished, and cooperative to examination.    Lungs:  normal respiratory effort, no intercostal retractions or use of accessory muscles; normal breath sounds bilaterally - no crackles and no wheezes.    Heart:  normal rate, regular rhythm, no murmur, and no rub. BLE without edema. normal DP pulses and normal cap refill in all 4 extremities    Neurologic:  alert & oriented X3 and cranial nerves II-XII symetrically intact.  strength normal in all extremities, sensation intact to light touch,  and gait normal. speech fluent but pressured without dysarthria or aphasia; follows commands with good comprehension.  Psych:  Oriented X3 but pressured speech, memory intact for recent and remote, normally and pleasant interactive, good eye contact, mildly anxious appearing, not depressed appearing, and not agitated.      Impression & Recommendations:  Problem # 1:  VITAMIN B12 DEFICIENCY (ICD-266.2)  pt concerned she's not absorbing the med -  recheck level now -  advised to change positions and avid long trips/sitting that could aggrevate compressive neuropathy symptoms  declines med tx such as neurontin at this time  Orders: TLB-B12, Serum-Total ONLY (04540-J81)  Problem # 2:  DYSLIPIDEMIA (ICD-272.4)  intol of statins - cont fish oil + red rice yeast   HDL:57.50 (04/17/2009)  Chol:251 (04/17/2009)  Trig:136.0 (04/17/2009)  Problem # 3:  OSTEOPOROSIS (ICD-733.00) add vit d and advised to schedule bone density when time is due later this year Discussed medication use, applications of heat or ice, and exercises.   Time spent with patient 25 minutes, more than 50% of this time was spent counseling patient on B12 deficiency, reviewing her medication intolerances, and developing plans to avoid aggrevating positions for her neuropathy symptoms   Complete Medication List: 1)  Nexium 40 Mg Cpdr (Esomeprazole magnesium) .... One tablet by mouth once daily 2)  Aspir-low 81 Mg Tbec (Aspirin) .... Take 1 tablet by mouth once a day 3)  Vitamin B-12 1000 Mcg Tabs (Cyanocobalamin) .... Take 2 tablet by mouth once a day 4)  Miralax Powd (Polyethylene glycol 3350) .... Take 17 grams daily in 8 oz water 5)  Calcium 500 Mg Tabs (Calcium carbonate) .... Take 1 three times a day 6)  One-a-day Womens Formula Tabs (Multiple vitamins-calcium) .... Take 1 by mouth qd 7)  Red Yeast Rice 600 Mg Caps (Red yeast rice extract) .... Take 2 by mouth qd 8)  Fish Oil 1200 Mg Caps (Omega-3 fatty acids) ....  Take 2 by mouth qd 9)  Vitamin D 1000 Unit Caps (Cholecalciferol) .[name].. 1 by mouth once daily  Patient Instructions: 1)  it was good to see you today.  2)  will recheck your B12 level today -  3)  continue with your bone density scheduling as discussed prior to your followup appointment here 4)  take Vit D 1000 units once daily when you are taking Tums

## 2010-08-29 NOTE — Progress Notes (Signed)
Summary: lab results  Phone Note Call from Patient Call back at Home Phone [phone] Call back at or cell [phone]   Caller: Patient Call For: Newt Lukes MD Summary of Call: Pt requesting results of b12 labs. Initial call taken by: Verdell Face,  September 03, 2009 9:53 AM  Follow-up for Phone Call        please let pt know her B12 level was very high - this is likely a reflection of recent dosing  - i do not recommend any change in her dosing of the medication - continue as she is doing  - thanks Follow-up by: Newt Lukes MD,  September 03, 2009 2:27 PM  Additional Follow-up for Phone Call Additional follow up Details #1::        Notified pt with results and md recommendations. Additional Follow-up by: Orlan Leavens,  September 03, 2009 4:11 PM

## 2010-09-04 NOTE — Assessment & Plan Note (Signed)
Summary: cough,chest congestion/cd   Vital Signs:  Patient profile:   76 year old female Height:      62.5 inches (158.75 cm) O2 Sat:      97 % on Room air Temp:     99.0 degrees F (37.22 degrees C) oral Pulse rate:   86 / minute BP sitting:   144 / 70  (left arm) Cuff size:   large  Vitals Entered By: Orlan Leavens RMA (August 28, 2010 4:21 PM)  O2 Flow:  Room air CC: Chest congestion Is Patient Diabetic? No Pain Assessment Patient in pain? no      Comments Pt states not able to cough up any phlegm, been taking mucinex dm and using nasal spray   Primary Care Provider:  Newt Lukes MD  CC:  Chest congestion.  History of Present Illness: c/o chest congestion onset 1 week ago , progresive symptoms  assoc with LGF, ear fullness and ST  yellow thin sputum and nasal drainage not improved with otc meds -   also reviewed chronic med issues: reports improvement in leg pain with PT pain located in back of thighs bilaterally - pain worse after prolonged sitting onset May 2010 eval by neurosurg (hirsch) and neuro (love) for same-  MRI l spine 02/28/2009 - mild DDD l4-5, no spinal stenosis tried mobic - which helped but upset stomach (vomitting) then lyrica - didn't help at all then found low b12 level - now taking pill but would like to not swallow pills leg symptoms felt to be related to low b12 per neuro -  missed a week of her B12 pills and concerned low level may be contrib to flare of pain  GERD - follows with dr. Russella Dar for same better when taking nexium -  but trying not to take nexium b/c concerned this pill blocking b12 absorbtion or causing bone loss- occ use of "lower dose prilosec 20mg " if needed and nexium only if "real bad" using Tums too and ?if this blocks absorbtion   b12 defic - dx 02/08/09 by lab = 178 at [facility] (labs reviewed) has not had shot - no pernicious anemia per GI (per pt)  hx dyslipidemia- adv reaction to lipitor in past requiring  hospitalization (?rhabdo) prev taking fish oil and red rice yeast but has stopped all meds few months ago to try to improve b12 absorbtion - now takes intermittently  osteoporosis hx- no personal fx hx no bone or back or joint pain at this time bone scan reviewed from 2009 and 12/21/09 - rec prolia q 6 mo - pt declines due to risk  Clinical Review Panels:  Prevention   Last Mammogram:  BI-RADS CATEGORY 2:  Benign finding(s).^MM DIGITAL DIAGNOSTIC UNILAT R (11/22/2009)   Last Pap Smear:   Normal (04/17/2009)   Last Colonoscopy:  Location:  [facility].  Results: Hemorrhoids.     Pathology:  Hyperplastic polyp.      (08/18/2006)  Immunizations   Last Tetanus Booster:  Historical (07/28/2000)   Last Flu Vaccine:  Fluvax 3+ (07/02/2010)   Last Pneumovax:  Historical (07/28/2000)  CBC   WBC:  6.1 (09/28/2009)   RBC:  3.88 (09/28/2009)   Hgb:  12.9 (09/28/2009)   Hct:  38.5 (09/28/2009)   Platelets:  314.0 (09/28/2009)   MCV  99.2 (09/28/2009)   MCHC  33.6 (09/28/2009)   RDW  12.7 (09/28/2009)   PMN:  59.5 (09/28/2009)   Lymphs:  26.8 (09/28/2009)   Monos:  10.8 (  09/28/2009)   Eosinophils:  2.0 (09/28/2009)   Basophil:  0.9 (09/28/2009)  Complete Metabolic Panel   Glucose:  101 (09/28/2009)   Sodium:  139 (09/28/2009)   Potassium:  4.7 (09/28/2009)   Chloride:  104 (09/28/2009)   CO2:  29 (09/28/2009)   BUN:  14 (09/28/2009)   Creatinine:  0.8 (09/28/2009)   Albumin:  4.2 (09/28/2009)   Total Protein:  7.1 (09/28/2009)   Calcium:  9.6 (09/28/2009)   Total Bili:  0.5 (09/28/2009)   Alk Phos:  51 (09/28/2009)   SGPT (ALT):  14 (09/28/2009)   SGOT (AST):  17 (09/28/2009)   Current Medications (verified): 1)  Nexium 40 Mg  Cpdr (Esomeprazole Magnesium) .... One Tablet By Mouth Once Daily As Needed 2)  Aspir-Low 81 Mg Tbec (Aspirin) .... Take 1 Tablet By Mouth Once A Day 3)  Vitamin B-12 1000 Mcg Tabs (Cyanocobalamin) .... Take 2 Tablet By Mouth Once A  Day 4)  Miralax  Powd (Polyethylene Glycol 3350) .... Take 17 Grams Daily in 8 Oz Water 5)  Vitamin D 1000 Unit Caps (Cholecalciferol) .[name].. 1 By Mouth Once Daily 6)  Tums E-X 750 750 Mg Chew (Calcium Carbonate Antacid) .... 2 Once Daily 7)  Super Probiotic  Caps (Probiotic Product) .... Take 1 By Mouth Once Daily  Allergies (verified): 1)  ! * Flu Vaccination  Past History:  Past Medical History: GERD Hx of Breast Cancer - 1978  Hyperplastic polyps 06/1998 Internal Hemorrhoids Osteoporosis Hyperlipidemia B12 deficency  MD roster: gyn-Cousins  GI-[facility]ark neuro - [facility] neurosurg - Hirsch  Review of Systems  The patient denies syncope, dyspnea on exertion, headaches, and hemoptysis.    Physical Exam  General:  alert, well-developed, well-nourished, and cooperative to examination.   nontoxic but coughing Ears:  R ear normal and L ear normal.   Nose:  nasal discharge, mucosal pallor.   Mouth:  teeth and gums in good repair; mucous membranes moist, without lesions or ulcers. oropharynx clear without exudate, mod erythema. yellow pnd Lungs:  normal respiratory effort, no intercostal retractions or use of accessory muscles; few rhonchi breath sounds bilaterally - no crackles and no wheezes.    Heart:  normal rate, regular rhythm, no murmur, and no rub. BLE without edema.    Impression & Recommendations:  Problem # 1:  ACUTE BRONCHITIS (ICD-466.0)  Her updated medication list for this problem includes:    Azithromycin 250 Mg Tabs (Azithromycin) .[name]... 2 tabs by mouth today, then 1 by mouth daily starting tomorrow    Promethazine-codeine 6.25-10 Mg/18ml Syrp (Promethazine-codeine) .[name]... 5cc by mouth every 4 hours as needed for cough symptoms  Take antibiotics and other medications as directed. Encouraged to push clear liquids, get enough rest, and take acetaminophen as needed. To be seen in 5-7 days if no improvement, sooner if worse.  Orders: Prescription Created Electronically  [id number])  Complete Medication List: 1)  Nexium 40 Mg Cpdr (Esomeprazole magnesium) .... One tablet by mouth once daily as needed 2)  Aspir-low 81 Mg Tbec (Aspirin) .... Take 1 tablet by mouth once a day 3)  Vitamin B-12 1000 Mcg Tabs (Cyanocobalamin) .... Take 2 tablet by mouth once a day 4)  Miralax Powd (Polyethylene glycol 3350) .... Take 17 grams daily in 8 oz water 5)  Vitamin D 1000 Unit Caps (Cholecalciferol) .[name].. 1 by mouth once daily 6)  Tums E-x 750 750 Mg Chew (Calcium carbonate antacid) .... 2 once daily 7)  Super Probiotic Caps (Probiotic product) .... Take 1  by mouth once daily 8)  Azithromycin 250 Mg Tabs (Azithromycin) .... 2 tabs by mouth today, then 1 by mouth daily starting tomorrow 9)  Promethazine-codeine 6.25-10 Mg/31ml Syrp (Promethazine-codeine) .... 5cc by mouth every 4 hours as needed for cough symptoms  Patient Instructions: 1)  it was good to see you today. 2)  Zpack and codiene cough syrup - your prescriptions have been submitted to your pharmacy. Please take as directed. Contact our office if you believe you're having problems with the medication(s).  3)  Get plenty of rest, drink lots of clear liquids, and use Tylenol or Aleve for fever and comfort. Return in 7-10 days if you're not better:sooner if you're feeling worse. 4)  Please keep scheduled follow-up appointment in March 2012 for physical and labs, call sooner if problems.  Prescriptions: PROMETHAZINE-CODEINE 6.25-10 MG/5ML SYRP (PROMETHAZINE-CODEINE) 5cc by mouth every 4 hours as needed for cough symptoms  #100 x 0   Entered and Authorized by:   Newt Lukes MD   Signed by:   Newt Lukes MD on 08/28/2010   Method used:   Printed then faxed to ...       [facility]* (retail)       [address]       [place], [place]  [address]       Ph: [phone]       Fax: [phone]   RxID:   [id number] AZITHROMYCIN 250 MG TABS (AZITHROMYCIN) 2 tabs by mouth today, then 1 by  mouth daily starting tomorrow  #6 x 0   Entered and Authorized by:   Newt Lukes MD   Signed by:   Newt Lukes MD on 08/28/2010   Method used:   Electronically to        [facility]* (retail)       [address]       [place], [place]  [id number]       Ph: [phone]       Fax: [phone]   RxID:   [id number]    Orders Added: 1)  Est. Patient Level IV [74944] 2)  Prescription Created Electronically [phone]

## 2010-09-10 ENCOUNTER — Telehealth: Payer: Self-pay | Admitting: Internal Medicine

## 2010-09-13 ENCOUNTER — Encounter: Payer: Self-pay | Admitting: Internal Medicine

## 2010-09-18 NOTE — Progress Notes (Signed)
Summary: ENT?   Phone Note Call from Patient Call back at [facility] Phone [phone] Call back at [phone]   Summary of Call: Pt contines to have have problems with her ears. Cough is better. She has seen someone at the ear center in the past. Should she see ENT if ear problems persist? or reeval here?  Initial call taken by: Lamar Sprinkles, CMA,  September 10, 2010 1:30 PM  Follow-up for Phone Call        refer to ENT done - thanks Follow-up by: Newt Lukes MD,  September 10, 2010 1:53 PM  Additional Follow-up for Phone Call Additional follow up Details #1::        pt informed Additional Follow-up by: Brenton Grills CMA Duncan Dull),  September 10, 2010 2:17 PM

## 2010-09-24 NOTE — Consult Note (Signed)
Summary: [facility]  [facility]   Imported By: Lennie Odor 09/17/2010 11:16:35  _____________________________________________________________________  External Attachment:    Type:   Image     Comment:   External Document

## 2010-10-14 ENCOUNTER — Encounter: Payer: Self-pay | Admitting: Internal Medicine

## 2010-10-15 ENCOUNTER — Ambulatory Visit (INDEPENDENT_AMBULATORY_CARE_PROVIDER_SITE_OTHER): Payer: Medicare Other | Admitting: Internal Medicine

## 2010-10-15 ENCOUNTER — Encounter: Payer: Self-pay | Admitting: Internal Medicine

## 2010-10-15 VITALS — BP 142/60 | HR 68 | Temp 98.4°F | Wt 159.1 lb

## 2010-10-15 DIAGNOSIS — Z23 Encounter for immunization: Secondary | ICD-10-CM

## 2010-10-15 DIAGNOSIS — M542 Cervicalgia: Secondary | ICD-10-CM | POA: Insufficient documentation

## 2010-10-15 DIAGNOSIS — Z136 Encounter for screening for cardiovascular disorders: Secondary | ICD-10-CM

## 2010-10-15 DIAGNOSIS — Z0000 Encounter for general adult medical examination without abnormal findings: Secondary | ICD-10-CM

## 2010-10-15 MED ORDER — METHOCARBAMOL 500 MG PO TABS: 500.0000 mg | ORAL_TABLET | Freq: Three times a day (TID) | ORAL | Status: DC | PRN

## 2010-10-15 NOTE — Progress Notes (Signed)
Subjective:    Patient ID: [name], female    DOB: 1935-01-19, 76 y.o.   MRN: [id number]  HPI  Here for medicare wellness  Diet: heart healthy or DM if diabetic Physical activity: sedentary Depression/mood screen: negative Hearing: intact to whispered voice Visual acuity: grossly normal, follows for annual exam with optho ADLs: capable Fall risk: none Home safety: good Cognitive evaluation: intact to orientation, naming, recall and repetition EOL planning: adv directives, full code/ I agree    Also complains of neck pain and tightness - FROM but limited rotation due to pain - denies truama, hx prior neck surg. No numbness or weakness in BUE, no balance problems or falls. No fever   Past Medical History  Diagnosis Date  . Osteoporosis   . Hyperlipidemia   . breast ca 1978  . Vitamin B12 deficiency    Past Surgical History  Procedure Date  . Hemorrhoid surgery 1984    With sunsequent correction of surgery  . Tubal ligation   . Left mastectomy 1978  . Left breast implant 1981    reports that she has never smoked. She does not have any smokeless tobacco history on file. She reports that she does not drink alcohol or use illicit drugs. family history includes Cancer in her maternal aunt and Diabetes in her brother and sister. Not on File  Review of Systems   Patient denies chest pain, edema or dyspnea on exertion. No headache, rash or abdominal pain. No specific complaints in a complete review of systems (except as listed above). Objective:   Physical Exam  Nursing note and vitals reviewed. Constitutional: She is oriented to person, place, and time. She appears well-developed and well-nourished. No distress.  HENT:  Head: Normocephalic and atraumatic.  Right Ear: External ear normal.  Left Ear: External ear normal.  Nose: Nose normal.  Mouth/Throat: Oropharynx is clear and moist. No oropharyngeal exudate.  Eyes: EOM are normal. Pupils are equal, round, and  reactive to light. Right eye exhibits no discharge. Left eye exhibits no discharge. No scleral icterus.  Neck: Normal range of motion. Neck supple. No JVD present. No tracheal deviation present. No thyromegaly present.  Cardiovascular: Normal rate, regular rhythm, normal heart sounds and intact distal pulses.  Exam reveals no friction rub.   No murmur heard. Pulmonary/Chest: Effort normal and breath sounds normal. No respiratory distress. She has no wheezes. She has no rales. She exhibits no tenderness.  Abdominal: Soft. Bowel sounds are normal. She exhibits no distension and no mass. There is no tenderness. There is no rebound and no guarding.  Genitourinary:       Defer to gyn  Musculoskeletal: Normal range of motion.       No gross deformities  Lymphadenopathy:    She has no cervical adenopathy.  Neurological: She is alert and oriented to person, place, and time. She has normal reflexes. No cranial nerve deficit.  Skin: Skin is warm and dry. No rash noted. She is not diaphoretic. No erythema.  Psychiatric: She has a normal mood and affect. Her behavior is normal. Judgment and thought content normal.  Neck: full range of motion but tightness of paraspinal muscles appreciated on palp L>R. Good and equal hand grip. Strength grossly intact of BUE. Back: full range of motion of thoracic and lumbar spine. Non tender to palpation. Negative straight leg raise. DTR's are symmetrically intact. Sensation intact in all dermatomes of the lower extremities. Full strength to manual muscle testing including the EHL, anterior tibialis,  gastrocnemius, quadricepts, and iliopsoas. the patient is able to heel toe walk without difficulty and ambulates with a normal gait.       Assessment & Plan:  Annual medicare wellness -  Today patient counseled on age appropriate routine health concerns for screening and prevention, each reviewed and up to date or declined. Immunizations reviewed and up to date or declined.  Labs/ECG reviewed. Risk factors for depression reviewed and negative. Hearing function and visual acuity are intact. ADLs screened and addressed as needed. Functional ability and level of safety reviewed and appropriate. Education, counseling and referrals performed based on assessed risks today. Patient provided with a copy of personalized plan for preventive services. Tdap provided  Also: 1. Neck pain, musculoskeletal  methocarbamol (ROBAXIN) 500 MG tablet  2. Need for prophylactic vaccination with combined diphtheria-tetanus-pertussis (DTP) vaccine  Tdap vaccine greater than or equal to 7yo IM  3. Routine general medical examination at a health care facility  EKG 12-Lead

## 2010-10-15 NOTE — Patient Instructions (Signed)
Good to see you today. Preventive services reviewed and up to date - Tdap done today. Use robaxin for  Neck pain as muscle relaxant - your prescription has been faxed to [facility] - use as directed and call if you believe you are having problems with the medication(s). followup 6 months for review, call sooner if probelms

## 2010-10-16 ENCOUNTER — Encounter: Payer: Self-pay | Admitting: Internal Medicine

## 2010-10-17 ENCOUNTER — Other Ambulatory Visit: Payer: Self-pay | Admitting: Internal Medicine

## 2010-10-17 DIAGNOSIS — Z9012 Acquired absence of left breast and nipple: Secondary | ICD-10-CM

## 2010-10-17 DIAGNOSIS — Z853 Personal history of malignant neoplasm of breast: Secondary | ICD-10-CM

## 2010-10-25 ENCOUNTER — Other Ambulatory Visit: Payer: Self-pay | Admitting: *Deleted

## 2010-10-25 DIAGNOSIS — M542 Cervicalgia: Secondary | ICD-10-CM

## 2010-10-25 MED ORDER — METHOCARBAMOL 500 MG PO TABS: 500.0000 mg | ORAL_TABLET | Freq: Three times a day (TID) | ORAL | Status: DC | PRN

## 2010-10-25 NOTE — Telephone Encounter (Signed)
Sent renewal to [facility] on Methocarbamol.Marland KitchenMarland Kitchen3/30/12@9 ;12am/LMB

## 2010-10-31 ENCOUNTER — Other Ambulatory Visit: Payer: Self-pay | Admitting: *Deleted

## 2010-10-31 DIAGNOSIS — M542 Cervicalgia: Secondary | ICD-10-CM

## 2010-10-31 MED ORDER — METHOCARBAMOL 500 MG PO TABS: 500.0000 mg | ORAL_TABLET | Freq: Three times a day (TID) | ORAL | Status: DC | PRN

## 2010-10-31 NOTE — Telephone Encounter (Signed)
Med is on list (robaxin) - yes ok to fill 90d supply as requested

## 2010-11-26 ENCOUNTER — Ambulatory Visit
Admission: RE | Admit: 2010-11-26 | Discharge: 2010-11-26 | Disposition: A | Discharge status: Home / Self Care | Payer: Medicare Other | Source: Ambulatory Visit | Attending: Internal Medicine | Admitting: Internal Medicine

## 2010-11-26 DIAGNOSIS — Z9012 Acquired absence of left breast and nipple: Secondary | ICD-10-CM

## 2010-11-26 DIAGNOSIS — Z853 Personal history of malignant neoplasm of breast: Secondary | ICD-10-CM

## 2010-12-10 NOTE — Procedures (Signed)
CAROTID DUPLEX EXAM   INDICATION:  Followup of carotid artery disease per [employer] screening.  The patient is asymptomatic.   HISTORY:  Diabetes:  No  Cardiac:  No  Hypertension:  No  Smoking:  No  Previous Surgery:  Left mastectomy in 1978  CV History:  Amaurosis Fugax No, Paresthesias No, Hemiparesis No                                        RIGHT             LEFT  Brachial systolic pressure:         140               Not taken  Brachial Doppler waveforms:         WNL  Vertebral direction of flow:        Antegrade         Antegrade  DUPLEX VELOCITIES (cm/sec)  CCA peak systolic                   88                121  ECA peak systolic                   88                77  ICA peak systolic                   60                76  ICA end diastolic                   16                19  PLAQUE MORPHOLOGY:                  Calcific          Calcific  PLAQUE AMOUNT:                      Minimal           Mild  PLAQUE LOCATION:                    Bifurcation, PICA PICA   IMPRESSION:  Bilateral 1-39% ICA stenosis.  Patent ECAS.  Bilateral antegrade flow in vertebral arteries  Preliminary report faxed to Dr. Elvis Coil office on 09/13/2007 at 3 p.m.   ___________________________________________  Janetta Hora. Fields, MD   PB/MEDQ  D:  09/13/2007  T:  09/14/2007  Job:  [id number]

## 2010-12-10 NOTE — Assessment & Plan Note (Signed)
[facility]                         GASTROENTEROLOGY OFFICE NOTE   [name], [name]                    MRN:          [id number]  DATE:09/23/2007                            DOB:          [birth year]    [name] reports very good control of her reflux symptoms on Nexium.  She tried Prilosec 20 mg on a daily basis for a few weeks, and it was  not as effective.  She had frequent breakthrough symptoms.  She has gas,  belching, and varying constipation.  She has tried high fiber diet,  which worsens her reflux.  She has tried stool softeners and milk of  magnesia, which tend to lead to abdominal cramping.  She notes no  melena, hematochezia, change in stool caliber, or weight loss.   MEDICATIONS  listed on the chart, updated, and reviewed.   ALLERGIES  none known   EXAM:  In no acute distress.  Weight 167.6, blood pressure 122/72, pulse 72 and regular.  CHEST:  Clear to auscultation bilaterally.  CARDIAC:  Regular rate and rhythm without murmurs.  ABDOMEN:  Soft and nontender.  Normoactive bowel sounds.  No palpable  organomegaly, masses, or hernia.   ASSESSMENT AND PLAN:  1. Gastroesophageal reflux disease.  Continue Nexium 40 mg p.o. q.      a.m. along with all standard antireflux measures.  Return office      visit in 1 year.  2. Constipation and intestinal gas.  She was given information on a      low-gas diet, advised to use Gas-X q.i.d. p.r.n.  She will try to      increase various fiber foods in her diet, including prunes and      prune juice.  May use MiraLax every other day and up to t.i.d. as      necessary.     Venita Lick. Russella Dar, MD, [facility]  Electronically Signed    MTS/MedQ  DD: 09/23/2007  DT: 09/23/2007  Job #: [id number]

## 2010-12-13 NOTE — Op Note (Signed)
NAME:  [name], [name] NO.:  [id number]   MEDICAL RECORD NO.:  [id number]          PATIENT TYPE:  OIB   LOCATION:  1508                         FACILITY:  [facility]   PHYSICIAN:  Madlyn Frankel. Charlann Boxer, M.D.  DATE OF BIRTH:  [name] 28, 1935   DATE OF PROCEDURE:  03/26/2006  DATE OF DISCHARGE:  03/27/2006                                 OPERATIVE REPORT   PREOPERATIVE DIAGNOSES:  1. Left shoulder subacromial impingement with associated bursitis.  2. Acromioclavicular degenerative joint disease.   POSTOPERATIVE DIAGNOSES FINDINGS:  1. Degenerative tearing to the anterior, superior, posterior labrum.  2. Degenerative fraying to the undersurface of the rotator cuff with an      intact rotator cuff attachment on the humeral head.  3. Subacromial impingement with a hooked acromion and bursitis.  4. Acromioclavicular degenerative joint disease.   PROCEDURES:  1. Left shoulder diagnostic and operative arthroscopy with labral      debridement, arterial surface debridement of the rotator cuff.  2. Arthroscopic subacromial decompression.  3. Open distal clavicle excision.   SURGEON:  Madlyn Frankel. Charlann Boxer, M.D.   ASSISTANT:  Cherly Beach.   ANESTHESIA:  Regional plus general.   BLOOD LOSS:  Minimal.   COMPLICATIONS:  None.   INDICATIONS FOR PROCEDURE:  [name] is a 76 year old right hand dominant  female who I have been following for some time with left shoulder problems.  She has failed multiple conservative attempts in management with anti-  inflammatories, therapy, injections.  Given the multiple failures an MRI was  ordered which revealed and confirmed degenerative changes noted, but  confirmed that there was no rotator cuff tear.  Given her persistent  symptoms despite conservative measures, she wished to proceed surgical  intervention.  I discussed with her the treatment options available and  risks and benefits were discussed, consent obtained.   PROCEDURE IN DETAIL:   The patient was brought to the operative theater.  Once adequate anesthesia and prep of antibiotics were administered, 1 g of  Ancef, the patient was positioned in the beach chair position.  Care was  taken to make sure bony prominences were padded and safely secured.   Attention was first then directed to the shoulder arthroscopy.  Standard  posterior and anterior portals were utilized.  Diagnostic evaluation of the  shoulder revealed the above-noted findings, particularly with a labral tear  into the anterior, superior and posterior aspect of the labrum.  Through the  anterior portal, a 42 shaver was introduced and debrided this labrum back to  stable boundaries.   Following this, examination of the rotator cuff was carried out, noted some  undersurface articular tearing and this was shaved down as well.   I also noted at the time the rotator cuff was otherwise intact, no evidence  of detachment.   Following this debridement, I did an examination of the biceps tendon which  was noted to be completely intact without degenerative signal.  Attention  was directed to the subacromial space.  In the lateral portal over the  anterior third of the acromion, the subacromial space was identified and  examined arthroscopically.  Using an ArthroCare wand and shaver, I was able  to perform bursectomy.  This was felt to define the curve to the acromion.  Using a high speed bur I was able to perform a subacromial decompression,  carrying it back to an open level, successfully decompressing the  subacromial space.  The ArthroCare wand was used to carry out the remaining  bursectomy.   Following this and irrigation of this area, the instrumentation was removed  from this area and attention was carried out to the open distal clavicle.   An incision was made over the distal clavicle joint.  Sharp dissection was  carried down to the capsule, perpendicular joint in the capsule was opened.   A distal  clavicle excision was then carried out using an oscillating saw  approximately 8-10 mm over medial to the joint.  I used a small rasp in  order to smooth out and contour the cut edges.  I also used the rasp in the  subacromial space to further contour this area.  Hemostasis was carried out  throughout the time, none was significantly needed.   At this point, I then irrigated this anterior wound.  I then reapproximated  the periosteum and capsular tissue over itself with a #1 Vicryl.  The  remaining wound was closed with a #2-0 Vicryl and Steri-Strips on the skin,  and the portal sites were reapproximated using #4-0 nylon.  Following this,  the wounds were all dressed sterilely.  The patient was brought to the  recovery room in a stable condition.   The patient otherwise tolerated the procedure without complication.      Madlyn Frankel Charlann Boxer, M.D.  Electronically Signed     MDO/MEDQ  D:  03/27/2006  T:  03/29/2006  Job:  [id number]

## 2010-12-13 NOTE — Assessment & Plan Note (Signed)
[name]                         GASTROENTEROLOGY OFFICE NOTE   [name], [name]                    MRN:          [id number]  DATE:08/10/2006                            DOB:          08/04/1934    This is a return office visit for GERD.  She states her reflux symptoms  have generally been well controlled on Nexium q.a.m. until the past 2-3  months when she has noted post prandial substernal fullness and  occasional mild epigastric pain. She denies dysphagia, odynophagia,  weight loss, change in bowel habits, change in stool caliber, melena, or  hematochezia.  She has a colonoscopy scheduled for later this month due  to a personal history of breast cancer.  Her last colonoscopy was  complete to the ascending colon in October, 2002.  Her prior colonoscopy  in December, 1999 was complete to the cecum.  Hyperplastic polyps were  noted at her first colonoscopy.   Current medications listed on the chart, updated, and reviewed.   MEDICATION ALLERGIES:  None known.   PHYSICAL EXAMINATION:  GENERAL:  No acute distress.  VITAL SIGNS:  Weight 166 pounds, up 12 pounds since September, 2006.  Blood pressure 124/72, pulse 54 and regular.  CHEST:  Clear to auscultation bilaterally.  CARDIAC:  Regular rate and rhythm without murmurs.  ABDOMEN:  Soft, nontender.  Normoactive bowel sounds.  No palpable  organomegaly, masses, or hernias.   ASSESSMENT/PLAN:  1. Flare of GERD.  She is to reintensify all antireflux measures.  A      trial of Protonix 40 mg p.o. q.a.m. for 10 days and then a trial of      AcipHex 20 mg p.o. q.a.m. for 10 days.  If these are not adequate      in helping her obtain better symptomatic control, we can try      Prevacid and Zegerid.  She may need a proton pump inhibitor on a      b.i.d. basis for a short while.  2. Personal history of breast cancer - recall colonoscopy later this      month as scheduled     Malcolm T. Russella Dar,  MD, [facility]  Electronically Signed    MTS/MedQ  DD: 08/10/2006  DT: 08/10/2006  Job #: [id number]

## 2010-12-13 NOTE — H&P (Signed)
[place]  Patient:    [name] Visit Number: [id number] MRN: [id number]          Service Type: MED Location: [phone] Attending Physician:  Wilson Singer Dictated by:   Lilly Cove, M.D. Admit Date:  11/29/2001                           History and Physical  HISTORY:  This is a 76 year old lady who has now got a weeks history of documented fever above 101 degrees Fahrenheit.  She feels she has chills with this fever.  There is no other source or site of infection except that she has a dry cough.  She also feels somewhat dyspneic at rest.  There are no particular urinary symptoms, sore throat or ear problems.  She also has been nauseated and has not been having a good appetite and she is anorexic.  PAST MEDICAL HISTORY:  Significant for left mastectomy in 1978 for breast cancer; she has not had any apparent recurrence since then.  Hemorrhoidectomy.  Bilateral tubal ligation.  Hypercholesterolemia and osteoporosis.  MEDICATIONS: 1. Evista 60 mg q.d. 2. Paxil 20 mg q.d. for panic attacks.  ALLERGIES:  None.  HEALTH MAINTENANCE:  Normal Pap smear and mammogram, 2001.  SOCIAL HISTORY:  She has been married for 47 years.  She does not smoke and does not drink alcohol.  She has been a homemaker.  FAMILY HISTORY:  Father died at 67 with congestive heart failure; he also had rheumatoid arthritis and throat cancer.  Mother died at 25 with a stroke.  She has a paraplegic brother and also another brother in good health.  She has two sisters in good health.  She has one son and one daughter who are in good health.  REVIEW OF SYSTEMS:  Apart from the symptoms mentioned above, there are no other symptoms referable to the constitutional, HEENT, cardiovascular, respiratory, gastrointestinal, genitourinary, musculoskeletal, neurological, dermatological, endocrine, or psychiatric systems.  PHYSICAL EXAMINATION:  GENERAL:  She is  hemodynamically stable but is clearly febrile with a temperature of 101 in the office and 102 in the hospital.  HEENT:  Her throat is mildly inflamed.  There are no exudates.  NECK:  There is no neck lymphadenopathy.  HEART:  Heart sounds are present with no murmurs.  LUNGS:  Lung fields are clear.  ABDOMEN:  Soft and possibly tender in the lower right quadrant but not significantly so.  NEUROLOGICAL:  She is alert and oriented with no focal neurological signs.  SKIN:  There are no obvious skin lesions or sites of infection.  MUSCULOSKELETAL:  There are no inflamed joints.  IMPRESSION: 1. Fever. 2. Nausea with possible dehydration.  PLAN:  We will do a CBC, sedimentation rate, complete metabolic panel, chest x-ray, rapid strep test and urine for urinalysis and culture.  We will put her on intravenous fluids and further recommendations will depend on the patients progress. Dictated by:   Lilly Cove, M.D. Attending Physician:  Wilson Singer DD:  11/29/01 TD:  11/29/01 Job: [id number]

## 2010-12-13 NOTE — Op Note (Signed)
NAME:  [name], [name] NO.:  [id number]   MEDICAL RECORD NO.:  [id number]          PATIENT TYPE:  INP   LOCATION:  2899                         FACILITY:  [facility]   PHYSICIAN:  Clydene Fake, M.D.  DATE OF BIRTH:  30-Jul-1934   DATE OF PROCEDURE:  11/10/2006  DATE OF DISCHARGE:                               OPERATIVE REPORT   DIAGNOSES:  Herniated nucleus pulposus, spondylosis, C5-6, with left-  sided radiculopathy.   POSTOPERATIVE DIAGNOSES:  Herniated nucleus pulposus, spondylosis, C5-6,  with left-sided radiculopathy.   PROCEDURE:  Anterior cervical decompression and diskectomy and fusion at  C5-6 with LifeMed allograft bone, and Trestle anterior cervical plate.   SURGEON:  Clydene Fake, M.D.   ASSISTANT:  Hewitt Shorts, M.D.   ANESTHESIA:  General endotracheal tube anesthesia.   ESTIMATED BLOOD LOSS:  Minimal.   BLOOD GIVEN:  None.   DRAINS:  None.   COMPLICATIONS:  None.   REASON FOR PROCEDURE:  The patient is a 76 year old woman, who has had  neck, left arm pain and numbness, found to have slight weakness of the  left biceps 5-/5 in strength, decreased sensation in the left C6  distribution.  MRI showed spondylytic changes at 5-6 with disk  protrusion to the left causing C6 compression.  The patient was brought  in for a decompression and fusion.   PROCEDURE IN DETAIL:  The patient was brought in the operating room.  General anesthesia was induced.  The patient was placed in 5 pounds of  halter traction and prepped and draped in a sterile fashion.  The site  of the incision was injected with 10 mL of 1% lidocaine with  epinephrine.  An incision was then made from the midline to the anterior  border of the sternocleidomastoid on the left side of the neck.  The  incision was taken down to the platysma, and hemostasis obtained with  Bovie cauterization.  The platysma was incised with the Bovie and blunt  dissection taken through to the  anterior cervical fascia of the anterior  cervical spine.  A needle was placed in the interspace and x-ray was  obtained, showing this was the 5-6 interspace.  The disk space was  incised with a 15 blade.  As the needle was removed, partial diskectomy  was performed with pituitary rongeurs.  The longus colli muscles were  reflected laterally using the Bovie, and self-retaining retractors were  placed.  Anterior osteophytes were removed with Kerrison punches, and  the diskectomy continued with curets, pituitary rongeurs and Kerrison  punches.  The microscope was brought in for microdissection, and 1 and 2-  mm Kerrison punches were used to remove the posterior disk, the  posterior osteophytes and the posterior ligament with decompression of  the central canal, and performing bilateral foraminotomies.  There was  compression more to the left, and this was decompressed.  When we were  finished, we had good decompression of the central canal and bilateral  C6 nerve roots.  We irrigated with antibiotic solution, had good  hemostasis, and measured the height of the disk space to  be 5 mm.  With  a high-speed drill, we removed some further anterior osteophytes.  We  roughened up the endplates with curets, and then we tapped the 5-mm  LifeMed allograft bone into place, countersinking it a millimeter or so.  We checked posterior to the graft and there was plenty of room between  the posterior aspect of the bone graft and the dura.  The weight was  removed from the traction.  The bone was firmly in place, and a Trestle  anterior cervical plate was placed over the anterior cervical spine with  2 screws placed into C5 and 2 into C6.  These were tightened down.  Lateral x-ray was obtained and showed good position of the plate, screws  and bone plug and good alignment of the spine.  The retractors were  removed.  Hemostasis was obtained with bipolar cauterization and  thrombin and Gelfoam.  The Gelfoam  was irrigated out when we irrigated  with antibiotic solution.  We had good hemostasis, and the platysma was  closed with 3-0 Vicryl interrupted.  The subcutaneous tissue was closed  with the same.  The skin was closed with benzoin and Steri-Strips.  A  dressing was placed.  The patient was placed in a soft cervical collar,  awakened from anesthesia and transferred to the recovery room in stable  condition.           ______________________________  Clydene Fake, M.D.     JRH/MEDQ  D:  11/10/2006  T:  11/11/2006  Job:  [id number]

## 2011-02-05 ENCOUNTER — Encounter: Payer: Self-pay | Admitting: Internal Medicine

## 2011-02-05 ENCOUNTER — Ambulatory Visit (INDEPENDENT_AMBULATORY_CARE_PROVIDER_SITE_OTHER): Payer: Medicare Other | Admitting: Internal Medicine

## 2011-02-05 VITALS — BP 180/82 | HR 88 | Temp 98.5°F | Resp 16 | Wt 162.0 lb

## 2011-02-05 DIAGNOSIS — R142 Eructation: Secondary | ICD-10-CM

## 2011-02-05 DIAGNOSIS — M810 Age-related osteoporosis without current pathological fracture: Secondary | ICD-10-CM

## 2011-02-05 DIAGNOSIS — IMO0001 Reserved for inherently not codable concepts without codable children: Secondary | ICD-10-CM

## 2011-02-05 DIAGNOSIS — R141 Gas pain: Secondary | ICD-10-CM

## 2011-02-05 DIAGNOSIS — R030 Elevated blood-pressure reading, without diagnosis of hypertension: Secondary | ICD-10-CM

## 2011-02-05 DIAGNOSIS — K219 Gastro-esophageal reflux disease without esophagitis: Secondary | ICD-10-CM

## 2011-02-05 MED ORDER — RANITIDINE HCL 300 MG PO TABS: 300.0000 mg | ORAL_TABLET | Freq: Every day | ORAL | Status: DC

## 2011-02-05 MED ORDER — METRONIDAZOLE 500 MG PO TABS: 500.0000 mg | ORAL_TABLET | Freq: Three times a day (TID) | ORAL | Status: DC

## 2011-02-05 MED ORDER — ALIGN 4 MG PO CAPS: 1.0000 | ORAL_CAPSULE | Freq: Every day | ORAL | Status: DC

## 2011-02-05 NOTE — Assessment & Plan Note (Signed)
OK to hold Ca - side effects

## 2011-02-05 NOTE — Assessment & Plan Note (Signed)
Start Ranitidine

## 2011-02-05 NOTE — Assessment & Plan Note (Signed)
Empiric Flagyl followed by Karn Pickler for gas She declined stool test/labs/ x rays F/u w/Dr Trish Mage in 3 wks - tests if needed then

## 2011-02-05 NOTE — Progress Notes (Signed)
  Subjective:    Patient ID: [name], female    DOB: [birth year], 76 y.o.   MRN: [id number]  HPI  C/o abd bloating and indigestion, bloating, passing gas C/o GERD. No CP Poss exposure to C. diff  Review of Systems  Constitutional: Negative.  Negative for fever, chills, diaphoresis, activity change, appetite change, fatigue and unexpected weight change.  HENT: Negative for hearing loss, ear pain, nosebleeds, congestion, sore throat, facial swelling, rhinorrhea, sneezing, mouth sores, trouble swallowing, neck pain, neck stiffness, postnasal drip, sinus pressure and tinnitus.   Eyes: Negative for pain, discharge, redness, itching and visual disturbance.  Respiratory: Negative for cough, chest tightness, shortness of breath, wheezing and stridor.   Cardiovascular: Negative for chest pain, palpitations and leg swelling.  Gastrointestinal: Positive for abdominal distention (gas). Negative for nausea, abdominal pain, diarrhea, constipation, blood in stool, anal bleeding and rectal pain.  Genitourinary: Negative for dysuria, urgency, frequency, hematuria, flank pain, vaginal bleeding, vaginal discharge, difficulty urinating, genital sores, vaginal pain and pelvic pain.  Musculoskeletal: Negative for back pain, joint swelling, arthralgias and gait problem.  Skin: Negative.  Negative for pallor and rash.  Neurological: Negative for dizziness, tremors, seizures, syncope, speech difficulty, weakness, numbness and headaches.  Hematological: Negative for adenopathy. Does not bruise/bleed easily.  Psychiatric/Behavioral: Negative for suicidal ideas, behavioral problems, confusion, sleep disturbance, dysphoric mood and decreased concentration. The patient is not nervous/anxious.        Objective:   Physical Exam  Constitutional: She appears well-developed and well-nourished. No distress.       obese  HENT:  Head: Normocephalic.  Right Ear: External ear normal.  Left Ear: External ear normal.    Nose: Nose normal.  Mouth/Throat: Oropharynx is clear and moist.  Eyes: Conjunctivae are normal. Pupils are equal, round, and reactive to light. Right eye exhibits no discharge. Left eye exhibits no discharge.  Neck: Normal range of motion. Neck supple. No JVD present. No tracheal deviation present. No thyromegaly present.  Cardiovascular: Normal rate, regular rhythm and normal heart sounds.   Pulmonary/Chest: No stridor. No respiratory distress. She has no wheezes.  Abdominal: Soft. Bowel sounds are normal. She exhibits no distension and no mass. There is no tenderness. There is no rebound and no guarding.  Musculoskeletal: She exhibits no edema and no tenderness.  Lymphadenopathy:    She has no cervical adenopathy.  Neurological: She displays normal reflexes. No cranial nerve deficit. She exhibits normal muscle tone. Coordination normal.  Skin: No rash noted. No erythema.  Psychiatric: Her behavior is normal. Judgment and thought content normal.       anxious          Assessment & Plan:

## 2011-02-26 ENCOUNTER — Encounter: Payer: Self-pay | Admitting: Internal Medicine

## 2011-02-26 ENCOUNTER — Other Ambulatory Visit (INDEPENDENT_AMBULATORY_CARE_PROVIDER_SITE_OTHER): Payer: Medicare Other

## 2011-02-26 ENCOUNTER — Ambulatory Visit (INDEPENDENT_AMBULATORY_CARE_PROVIDER_SITE_OTHER): Payer: Medicare Other | Admitting: Internal Medicine

## 2011-02-26 DIAGNOSIS — R5381 Other malaise: Secondary | ICD-10-CM

## 2011-02-26 DIAGNOSIS — F419 Anxiety disorder, unspecified: Secondary | ICD-10-CM

## 2011-02-26 DIAGNOSIS — Z79899 Other long term (current) drug therapy: Secondary | ICD-10-CM

## 2011-02-26 DIAGNOSIS — K589 Irritable bowel syndrome without diarrhea: Secondary | ICD-10-CM

## 2011-02-26 DIAGNOSIS — F411 Generalized anxiety disorder: Secondary | ICD-10-CM

## 2011-02-26 DIAGNOSIS — R5383 Other fatigue: Secondary | ICD-10-CM

## 2011-02-26 DIAGNOSIS — M62838 Other muscle spasm: Secondary | ICD-10-CM

## 2011-02-26 LAB — CBC WITH DIFFERENTIAL/PLATELET
Basophils Absolute: 0 10*3/uL (ref 0.0–0.1)
Eosinophils Absolute: 0.1 10*3/uL (ref 0.0–0.7)
Lymphocytes Relative: 30.7 % (ref 12.0–46.0)
MCHC: 33.6 g/dL (ref 30.0–36.0)
Neutrophils Relative %: 56.5 % (ref 43.0–77.0)
RBC: 3.89 Mil/uL (ref 3.87–5.11)
RDW: 13.5 % (ref 11.5–14.6)

## 2011-02-26 LAB — BASIC METABOLIC PANEL
CO2: 26 mEq/L (ref 19–32)
Calcium: 8.8 mg/dL (ref 8.4–10.5)
Creatinine, Ser: 0.8 mg/dL (ref 0.4–1.2)
Glucose, Bld: 99 mg/dL (ref 70–99)

## 2011-02-26 LAB — TSH: TSH: 1.24 u[IU]/mL (ref 0.35–5.50)

## 2011-02-26 MED ORDER — TIZANIDINE HCL 4 MG PO TABS: 4.0000 mg | ORAL_TABLET | Freq: Three times a day (TID) | ORAL | Status: DC | PRN

## 2011-02-26 NOTE — Patient Instructions (Signed)
It was good to see you today. Test(s) ordered today. Your results will be called to you after review (48-72hours after test completion). If any changes need to be made, you will be notified at that time. Use zanaflex (generic) for humana approved muscle relaxer in place of robaxin - Your prescription(s) have been submitted to your pharmacy. Please take as directed and contact our office if you believe you are having problem(s) with the medication(s). Please schedule followup in 6 months, call sooner if problems. (ok to cancel appointment next month)

## 2011-02-26 NOTE — Progress Notes (Signed)
  Subjective:    Patient ID: [name], female    DOB: 07-06-1935, 76 y.o.   MRN: [id number]  HPI  Here for follow up - GI issues - abd bloat and gas Seen by my partner AVP 3 weeks ago for same - >rx flagyl but did not take Symptoms worse with stress (sick friend at [facility] with c diff) No nausea and vomiting or diarrhea - no bowel  Changes  Past Medical History  Diagnosis Date  . Osteoporosis   . Hyperlipidemia   . Vitamin B12 deficiency   . breast ca 1978  . GERD (gastroesophageal reflux disease)     Review of Systems  Constitutional: Positive for fatigue.  HENT: Negative for ear pain.   Respiratory: Negative for cough and shortness of breath.        Objective:   Physical Exam BP 118/60  Pulse 69  Temp(Src) 98.3 F (36.8 C) (Oral)  Ht 5' 2.5" (1.588 m)  Wt 159 lb 6.4 oz (72.303 kg)  BMI 28.69 kg/m2  SpO2 98%  Constitutional: She is oriented to person, place, and time. She appears well-developed and well-nourished. No distress.  Neck: Normal range of motion. Neck supple. No JVD present. No thyromegaly present.  Cardiovascular: Normal rate, regular rhythm and normal heart sounds.  No murmur heard. No BLE edema. Pulmonary/Chest: Effort normal and breath sounds normal. No respiratory distress. She has no wheezes.  Abdominal: Soft. Bowel sounds are normal. She exhibits no distension. There is no tenderness.  Psychiatric: She has a anxiety mood and affect. Her behavior is normal. Judgment and thought content normal.   Lab Results  Component Value Date   WBC 6.1 09/28/2009   HGB 12.9 09/28/2009   HCT 38.5 09/28/2009   PLT 314.0 09/28/2009   CHOL 263* 01/08/2010   TRIG 92.0 01/08/2010   HDL 66.80 01/08/2010   LDLDIRECT 182.3 01/08/2010   ALT 14 09/28/2009   AST 17 09/28/2009   NA 139 09/28/2009   K 4.7 09/28/2009   CL 104 09/28/2009   CREATININE 0.8 09/28/2009   BUN 14 09/28/2009   CO2 29 09/28/2009   TSH 1.22 04/17/2009      Assessment & Plan:  IBS symptoms, exac by stress  - GI symptoms improved by still considerable anxiety  Neck spasm - working with [employer] and PT for same - insurance not covering robaxin, change to zanaflex per [place] formulary - erx done  Anxiety - exac by terminal decline of close friend - reassurance provided, declines BZ at this time due to desire to "keep a clear mind" - support poffered - pt will call if worse  Fatigue - check screening labs - may be related to stress and anxiety

## 2011-02-27 LAB — HEPATIC FUNCTION PANEL
Alkaline Phosphatase: 48 U/L (ref 39–117)
Bilirubin, Direct: 0.1 mg/dL (ref 0.0–0.3)

## 2011-03-07 ENCOUNTER — Telehealth: Payer: Self-pay

## 2011-03-07 MED ORDER — ZOSTER VACCINE LIVE 19400 UNT/0.65ML ~~LOC~~ SOLR: 0.6500 mL | Freq: Once | SUBCUTANEOUS | Status: DC

## 2011-03-07 NOTE — Telephone Encounter (Signed)
Pt called requesting Rx for Zostavax to her local pharmacy, okay to sent Rx?

## 2011-03-07 NOTE — Telephone Encounter (Signed)
yes

## 2011-03-07 NOTE — Telephone Encounter (Signed)
Called pt no answer LMOM md approve zostavax sent rx to [place].Marland KitchenMarland Kitchen8/10/12@ 2:53pm/LMB

## 2011-03-14 ENCOUNTER — Encounter: Payer: Self-pay | Admitting: Internal Medicine

## 2011-04-08 ENCOUNTER — Other Ambulatory Visit: Payer: Self-pay | Admitting: *Deleted

## 2011-04-08 MED ORDER — RANITIDINE HCL 300 MG PO TABS: 300.0000 mg | ORAL_TABLET | Freq: Every day | ORAL | Status: DC

## 2011-04-08 MED ORDER — METHOCARBAMOL 500 MG PO TABS: 500.0000 mg | ORAL_TABLET | Freq: Three times a day (TID) | ORAL | Status: DC | PRN

## 2011-04-22 ENCOUNTER — Ambulatory Visit: Payer: Medicare Other | Admitting: Internal Medicine

## 2011-06-27 ENCOUNTER — Other Ambulatory Visit: Payer: Medicare Other

## 2011-06-27 ENCOUNTER — Other Ambulatory Visit (INDEPENDENT_AMBULATORY_CARE_PROVIDER_SITE_OTHER): Payer: Medicare Other

## 2011-06-27 ENCOUNTER — Ambulatory Visit (INDEPENDENT_AMBULATORY_CARE_PROVIDER_SITE_OTHER): Payer: Medicare Other | Admitting: Internal Medicine

## 2011-06-27 ENCOUNTER — Encounter: Payer: Self-pay | Admitting: Internal Medicine

## 2011-06-27 ENCOUNTER — Other Ambulatory Visit: Payer: Self-pay | Admitting: Internal Medicine

## 2011-06-27 VITALS — BP 136/54 | HR 73 | Temp 97.9°F | Resp 14 | Ht 63.0 in | Wt 163.5 lb

## 2011-06-27 DIAGNOSIS — Z23 Encounter for immunization: Secondary | ICD-10-CM

## 2011-06-27 DIAGNOSIS — R110 Nausea: Secondary | ICD-10-CM

## 2011-06-27 DIAGNOSIS — K219 Gastro-esophageal reflux disease without esophagitis: Secondary | ICD-10-CM

## 2011-06-27 LAB — CBC WITH DIFFERENTIAL/PLATELET
Basophils Absolute: 0 10*3/uL (ref 0.0–0.1)
Basophils Relative: 0.9 % (ref 0.0–3.0)
Eosinophils Relative: 1.4 % (ref 0.0–5.0)
HCT: 36 % (ref 36.0–46.0)
Hemoglobin: 12.4 g/dL (ref 12.0–15.0)
Lymphs Abs: 1.7 10*3/uL (ref 0.7–4.0)
MCHC: 34.4 g/dL (ref 30.0–36.0)
MCV: 98 fl (ref 78.0–100.0)
Neutro Abs: 2.5 10*3/uL (ref 1.4–7.7)
RBC: 3.68 Mil/uL — ABNORMAL LOW (ref 3.87–5.11)
WBC: 4.9 10*3/uL (ref 4.5–10.5)

## 2011-06-27 LAB — HEPATIC FUNCTION PANEL
ALT: 13 U/L (ref 0–35)
Bilirubin, Direct: 0 mg/dL (ref 0.0–0.3)
Total Bilirubin: 0.4 mg/dL (ref 0.3–1.2)

## 2011-06-27 MED ORDER — ESOMEPRAZOLE MAGNESIUM 40 MG PO CPDR: 40.0000 mg | DELAYED_RELEASE_CAPSULE | Freq: Every day | ORAL | Status: DC

## 2011-06-27 NOTE — Patient Instructions (Addendum)
It was good to see you today. Change Zantac to Nexium once daily for 30 days to help with reflux symptoms - Your prescription(s) have been submitted to your pharmacy. Please take as directed and contact our office if you believe you are having problem(s) with the medication(s). Test(s) ordered today. Your results will be called to you after review (48-72hours after test completion). If any changes need to be made, you will be notified at that time. Depending on your symptoms and the results of these tests, we'll consider referral to Dr. Russella Dar as needed Flu shot done today

## 2011-06-27 NOTE — Assessment & Plan Note (Addendum)
Change Ranitidine to rx PPI qd x 30d Previously on Nexium with good relief> stopped due to concerns of B12 absorption Check labs as discussed above - If symptoms still unimporved, consider repeat GI eval as needed EGD 11/2004 reviewed>>candida, no stricture

## 2011-06-27 NOTE — Progress Notes (Signed)
  Subjective:    Patient ID: [name], female    DOB: 1935-06-30, 76 y.o.   MRN: [id number]  Gastrophageal Reflux She complains of early satiety, heartburn, nausea and water brash. She reports no globus sensation or no hoarse voice. This is a recurrent problem. The current episode started 1 to 4 weeks ago. The problem occurs frequently. The problem has been waxing and waning. The heartburn does not wake her from sleep. The heartburn does not limit her activity. The heartburn changes with position. The symptoms are aggravated by stress. Pertinent negatives include no fatigue or weight loss. Risk factors include NSAIDs. She has tried a histamine-2 antagonist, a diet change, a PPI, head elevation and medication cessation for the symptoms. The treatment provided mild relief.    Past Medical History  Diagnosis Date  . Osteoporosis   . Hyperlipidemia   . Vitamin B12 deficiency   . breast ca 1978  . GERD (gastroesophageal reflux disease)      Review of Systems  Constitutional: Negative for weight loss and fatigue.  HENT: Negative for hoarse voice.   Gastrointestinal: Positive for heartburn and nausea.       Objective:   Physical Exam BP 136/54  Pulse 73  Temp(Src) 97.9 F (36.6 C) (Oral)  Resp 14  Ht 5\' 3"  (1.6 m)  Wt 163 lb 8 oz (74.163 kg)  BMI 28.96 kg/m2  SpO2 99% Wt Readings from Last 3 Encounters:  06/27/11 163 lb 8 oz (74.163 kg)  02/26/11 159 lb 6.4 oz (72.303 kg)  02/05/11 162 lb (73.483 kg)   Constitutional: She appears well-developed and well-nourished. No distress.  Cardiovascular: Normal rate, regular rhythm and normal heart sounds.  No murmur heard. No BLE edema. Pulmonary/Chest: Effort normal and breath sounds normal. No respiratory distress. She has no wheezes.  Abdominal: Soft. Bowel sounds are normal. She exhibits no distension. There is no tenderness. no masses Psychiatric: She has a normal mood and affect. Her behavior is normal. Judgment and thought  content normal.   Lab Results  Component Value Date   WBC 5.3 02/26/2011   HGB 12.7 02/26/2011   HCT 37.8 02/26/2011   PLT 304.0 02/26/2011   GLUCOSE 99 02/26/2011   CHOL 263* 01/08/2010   TRIG 92.0 01/08/2010   HDL 66.80 01/08/2010   LDLDIRECT 182.3 01/08/2010   ALT 12 02/26/2011   AST 14 02/26/2011   NA 139 02/26/2011   K 4.3 02/26/2011   CL 104 02/26/2011   CREATININE 0.8 02/26/2011   BUN 14 02/26/2011   CO2 26 02/26/2011   TSH 1.24 02/26/2011        Assessment & Plan:  GERD symptoms - intermittent H2B and OTC PPI use with varibale symptoms relief - check labs including H pylori Ag, CBC and LFTs

## 2011-06-28 LAB — HELICOBACTER PYLORI  SPECIAL ANTIGEN: H. PYLORI Antigen: NEGATIVE

## 2011-07-21 ENCOUNTER — Other Ambulatory Visit: Payer: Self-pay

## 2011-07-21 DIAGNOSIS — K219 Gastro-esophageal reflux disease without esophagitis: Secondary | ICD-10-CM

## 2011-07-21 MED ORDER — ESOMEPRAZOLE MAGNESIUM 40 MG PO CPDR: 40.0000 mg | DELAYED_RELEASE_CAPSULE | Freq: Every day | ORAL | Status: DC

## 2011-08-27 ENCOUNTER — Ambulatory Visit: Payer: Medicare Other | Admitting: Internal Medicine

## 2011-08-29 DIAGNOSIS — H0019 Chalazion unspecified eye, unspecified eyelid: Secondary | ICD-10-CM | POA: Diagnosis not present

## 2011-09-03 ENCOUNTER — Encounter: Payer: Self-pay | Admitting: Gastroenterology

## 2011-09-12 DIAGNOSIS — B009 Herpesviral infection, unspecified: Secondary | ICD-10-CM | POA: Diagnosis not present

## 2011-09-12 DIAGNOSIS — D235 Other benign neoplasm of skin of trunk: Secondary | ICD-10-CM | POA: Diagnosis not present

## 2011-10-09 ENCOUNTER — Other Ambulatory Visit (INDEPENDENT_AMBULATORY_CARE_PROVIDER_SITE_OTHER): Payer: Medicare Other

## 2011-10-09 ENCOUNTER — Ambulatory Visit (INDEPENDENT_AMBULATORY_CARE_PROVIDER_SITE_OTHER): Payer: Medicare Other | Admitting: Internal Medicine

## 2011-10-09 ENCOUNTER — Encounter: Payer: Self-pay | Admitting: Internal Medicine

## 2011-10-09 VITALS — BP 122/62 | HR 69 | Temp 98.3°F | Ht 62.0 in | Wt 162.0 lb

## 2011-10-09 DIAGNOSIS — E663 Overweight: Secondary | ICD-10-CM

## 2011-10-09 DIAGNOSIS — E785 Hyperlipidemia, unspecified: Secondary | ICD-10-CM | POA: Diagnosis not present

## 2011-10-09 DIAGNOSIS — M810 Age-related osteoporosis without current pathological fracture: Secondary | ICD-10-CM | POA: Diagnosis not present

## 2011-10-09 DIAGNOSIS — K219 Gastro-esophageal reflux disease without esophagitis: Secondary | ICD-10-CM

## 2011-10-09 DIAGNOSIS — J309 Allergic rhinitis, unspecified: Secondary | ICD-10-CM | POA: Diagnosis not present

## 2011-10-09 DIAGNOSIS — Z23 Encounter for immunization: Secondary | ICD-10-CM

## 2011-10-09 LAB — LDL CHOLESTEROL, DIRECT: Direct LDL: 183.1 mg/dL

## 2011-10-09 LAB — LIPID PANEL
HDL: 60.6 mg/dL (ref >39.00)
Total CHOL/HDL Ratio: 4
Triglycerides: 89 mg/dL (ref 0.0–149.0)

## 2011-10-09 NOTE — Assessment & Plan Note (Signed)
Intol of statin - declined tx for same in past  Working on diet/exercise and weight control - recheck now

## 2011-10-09 NOTE — Assessment & Plan Note (Signed)
Hx reviewed - nonspecific treatment due to contraindication or side effect Continued vitamin D with calcium as tolerated and weightbearing exercise Last DEXA May 2011 reviewed, no new bone symptoms/back pain

## 2011-10-09 NOTE — Progress Notes (Signed)
  Subjective:    Patient ID: [name], female    DOB: 1935/05/07, 77 y.o.   MRN: [id number]  HPI   Here for follow up -   Chronic GI issues - abd bloat and gas consistent with IBS Also GERD - on PPI intermittently for same No nausea and vomiting or diarrhea - no bowel changes or weight changes  complains of allergic rhinitis symptoms - imporved with OTC meds, no fever  Past Medical History  Diagnosis Date  . Osteoporosis     dexa 11/2009: -3.5 spine, -1.8 L fem, -1.9 R fem  . Hyperlipidemia   . Vitamin B12 deficiency   . breast ca 1978  . GERD (gastroesophageal reflux disease)   . IBS (irritable bowel syndrome)     Review of Systems  Constitutional: Positive for fatigue. Negative for fever and unexpected weight change.  HENT: Positive for congestion, sneezing and postnasal drip. Negative for ear pain, sinus pressure and ear discharge.   Respiratory: Negative for cough and shortness of breath.        Objective:   Physical Exam  BP 122/62  Pulse 69  Temp(Src) 98.3 F (36.8 C) (Oral)  Ht 5\' 2"  (1.575 m)  Wt 162 lb (73.483 kg)  BMI 29.63 kg/m2  SpO2 97% Wt Readings from Last 3 Encounters:  10/09/11 162 lb (73.483 kg)  06/27/11 163 lb 8 oz (74.163 kg)  02/26/11 159 lb 6.4 oz (72.303 kg)   Constitutional: She appears well-developed and well-nourished. No distress.  HENT: NCAT, sinus non tender - nares boggy/pale with clear thin discharge - TMs clear and OP clear Neck: Normal range of motion. Neck supple. No JVD present. No thyromegaly present.  Cardiovascular: Normal rate, regular rhythm and normal heart sounds.  No murmur heard. No BLE edema. Pulmonary/Chest: Effort normal and breath sounds normal. No respiratory distress. She has no wheezes.  Abdominal: Soft. Bowel sounds are normal. She exhibits no distension. There is no tenderness.  Psychiatric: She has mild anxiety - normal affect. Her behavior is normal. Judgment and thought content normal.   Lab  Results  Component Value Date   WBC 4.9 06/27/2011   HGB 12.4 06/27/2011   HCT 36.0 06/27/2011   PLT 298.0 06/27/2011   CHOL 263* 01/08/2010   TRIG 92.0 01/08/2010   HDL 66.80 01/08/2010   LDLDIRECT 182.3 01/08/2010   ALT 13 06/27/2011   AST 16 06/27/2011   NA 139 02/26/2011   K 4.3 02/26/2011   CL 104 02/26/2011   CREATININE 0.8 02/26/2011   BUN 14 02/26/2011   CO2 26 02/26/2011   TSH 1.24 02/26/2011      Assessment & Plan:  See problem list. Medications and labs reviewed today.

## 2011-10-09 NOTE — Assessment & Plan Note (Signed)
Wt Readings from Last 3 Encounters:  10/09/11 162 lb (73.483 kg)  06/27/11 163 lb 8 oz (74.163 kg)  02/26/11 159 lb 6.4 oz (72.303 kg)   Discussed diet/exercise and healthy life style habits Will refer to nutrition at pt request for same

## 2011-10-09 NOTE — Assessment & Plan Note (Signed)
Changed Ranitidine to rx PPI 05/2011 - uses prn now Previously on Nexium with good relief> prev stopped due to concerns of B12 absorption EGD 11/2004 reviewed>>candida, no stricture The current medical regimen is effective;  continue present plan and medications.

## 2011-10-09 NOTE — Patient Instructions (Signed)
It was good to see you today. Test(s) ordered today. Your results will be called to you after review (48-72hours after test completion). If any changes need to be made, you will be notified at that time. Health Maintenance reviewed - Pneumovax given.  Medications reviewed, no changes at this time. Please schedule followup in 6-12 months, call sooner if problems.

## 2011-10-10 ENCOUNTER — Telehealth: Payer: Self-pay | Admitting: *Deleted

## 2011-10-10 NOTE — Telephone Encounter (Signed)
Pt was notified.. 10/10/11@4 :50pm/LMB

## 2011-10-10 NOTE — Telephone Encounter (Signed)
Refer done 3/14 at [facility] will call

## 2011-10-10 NOTE — Telephone Encounter (Signed)
Called pt concerning labs results. Pt states md was going to refer her to dietian. Requesting referral... 10/10/11@3 :55pm/LMB

## 2011-10-24 ENCOUNTER — Other Ambulatory Visit: Payer: Self-pay | Admitting: Internal Medicine

## 2011-10-24 DIAGNOSIS — Z1231 Encounter for screening mammogram for malignant neoplasm of breast: Secondary | ICD-10-CM

## 2011-10-24 DIAGNOSIS — Z9012 Acquired absence of left breast and nipple: Secondary | ICD-10-CM

## 2011-11-25 ENCOUNTER — Ambulatory Visit: Payer: Medicare Other | Admitting: Dietician

## 2011-11-28 ENCOUNTER — Ambulatory Visit
Admission: RE | Admit: 2011-11-28 | Discharge: 2011-11-28 | Disposition: A | Discharge status: Home / Self Care | Payer: Medicare Other | Source: Ambulatory Visit | Attending: Internal Medicine | Admitting: Internal Medicine

## 2011-11-28 DIAGNOSIS — Z1231 Encounter for screening mammogram for malignant neoplasm of breast: Secondary | ICD-10-CM | POA: Diagnosis not present

## 2011-11-28 DIAGNOSIS — Z9012 Acquired absence of left breast and nipple: Secondary | ICD-10-CM

## 2012-01-23 ENCOUNTER — Ambulatory Visit (INDEPENDENT_AMBULATORY_CARE_PROVIDER_SITE_OTHER)
Admission: RE | Admit: 2012-01-23 | Discharge: 2012-01-23 | Disposition: A | Discharge status: Home / Self Care | Payer: Medicare Other | Source: Ambulatory Visit

## 2012-01-23 ENCOUNTER — Ambulatory Visit (INDEPENDENT_AMBULATORY_CARE_PROVIDER_SITE_OTHER): Payer: Medicare Other | Admitting: Internal Medicine

## 2012-01-23 ENCOUNTER — Encounter: Payer: Self-pay | Admitting: Internal Medicine

## 2012-01-23 VITALS — BP 132/68 | HR 80 | Temp 98.4°F | Resp 16 | Ht 62.0 in | Wt 166.0 lb

## 2012-01-23 DIAGNOSIS — K219 Gastro-esophageal reflux disease without esophagitis: Secondary | ICD-10-CM

## 2012-01-23 DIAGNOSIS — R490 Dysphonia: Secondary | ICD-10-CM | POA: Diagnosis not present

## 2012-01-23 DIAGNOSIS — M810 Age-related osteoporosis without current pathological fracture: Secondary | ICD-10-CM | POA: Diagnosis not present

## 2012-01-23 DIAGNOSIS — E785 Hyperlipidemia, unspecified: Secondary | ICD-10-CM | POA: Diagnosis not present

## 2012-01-23 NOTE — Assessment & Plan Note (Signed)
Changed Ranitidine to rx PPI 05/2011 - uses only prn Previously on Nexium with good relief> prev stopped due to concerns of B12 absorption EGD 11/2004 reviewed>>candida, no stricture The current medical regimen is effective;  continue present plan and medications.

## 2012-01-23 NOTE — Assessment & Plan Note (Signed)
Hx reviewed - no specific treatment due to contraindication or side effect Continued vitamin D with calcium as tolerated and weightbearing exercise Last DEXA May 2011 reviewed - recheck now

## 2012-01-23 NOTE — Progress Notes (Signed)
  Subjective:    Patient ID: [name], female    DOB: 12/16/1934, 77 y.o.   MRN: [id number]  HPI  Here for follow up -  See CC concerns  Chronic GI issues - abdominal bloat and gas consistent with IBS Also GERD - on PPI intermittently for same, ?resume H2B No nausea and vomiting or diarrhea - no bowel changes or weight changes  complains of allergic rhinitis symptoms - improved with OTC meds but only takes prn  Past Medical History  Diagnosis Date  . Osteoporosis     dexa 11/2009: -3.5 spine, -1.8 L fem, -1.9 R fem  . Hyperlipidemia   . Vitamin B12 deficiency   . breast ca 1978  . GERD (gastroesophageal reflux disease)   . IBS (irritable bowel syndrome)     Review of Systems  Constitutional: Positive for fatigue. Negative for fever and unexpected weight change.  HENT: Positive for congestion, sneezing and postnasal drip. Negative for ear pain, sinus pressure and ear discharge.        Hoarse voice  Respiratory: Negative for cough and shortness of breath.        Objective:   Physical Exam  BP 132/68  Pulse 80  Temp 98.4 F (36.9 C) (Oral)  Resp 16  Ht 5\' 2"  (1.575 m)  Wt 166 lb (75.297 kg)  BMI 30.36 kg/m2 Wt Readings from Last 3 Encounters:  01/23/12 166 lb (75.297 kg)  10/09/11 162 lb (73.483 kg)  06/27/11 163 lb 8 oz (74.163 kg)   Constitutional: She is overweight, but appears well-developed and well-nourished. No distress.  HENT: NCAT, sinus non tender - nares clear - TMs clear, OP with trace clear PND Neck: Normal range of motion. Neck supple. No JVD or LAD present. No thyromegaly present. no carotid bruits Cardiovascular: Normal rate, regular rhythm and normal heart sounds.  No murmur heard. No BLE edema. Pulmonary/Chest: Effort normal and breath sounds normal. No respiratory distress. She has no wheezes.  Psychiatric: She has moderate anxiety - normal affect. Her behavior is normal. Judgment and thought content normal.   Lab Results  Component  Value Date   WBC 4.9 06/27/2011   HGB 12.4 06/27/2011   HCT 36.0 06/27/2011   PLT 298.0 06/27/2011   CHOL 272* 10/09/2011   TRIG 89.0 10/09/2011   HDL 60.60 10/09/2011   LDLDIRECT 183.1 10/09/2011   ALT 13 06/27/2011   AST 16 06/27/2011   NA 139 02/26/2011   K 4.3 02/26/2011   CL 104 02/26/2011   CREATININE 0.8 02/26/2011   BUN 14 02/26/2011   CO2 26 02/26/2011   TSH 1.24 02/26/2011      Assessment & Plan:  See problem list. Medications and labs reviewed today.  Hoarse - chronic intermittent symptoms - prior ENT eval for same - suspect related to allergic rhinitis and GERD - advised on tx and reassurance provided

## 2012-01-23 NOTE — Patient Instructions (Signed)
It was good to see you today. we'll schedule you for DEXA (bone density). Our office will contact you regarding appointment(s) once made. Your results will be called to you after review of results. If any changes need to be made, you will be notified at that time. Medications reviewed and updated - ok to use Zantac every night in place of Nexium, no other changes at this time. Please schedule followup in 6-12 months, call sooner if problems.

## 2012-01-23 NOTE — Assessment & Plan Note (Signed)
Intol of statin - has repeatedly declined tx for same Advised to continue working on diet/exercise and weight control Last lipids reviewed

## 2012-01-26 DIAGNOSIS — H2510 Age-related nuclear cataract, unspecified eye: Secondary | ICD-10-CM | POA: Diagnosis not present

## 2012-02-24 DIAGNOSIS — Z01419 Encounter for gynecological examination (general) (routine) without abnormal findings: Secondary | ICD-10-CM | POA: Diagnosis not present

## 2012-02-24 DIAGNOSIS — Z124 Encounter for screening for malignant neoplasm of cervix: Secondary | ICD-10-CM | POA: Diagnosis not present

## 2012-06-09 ENCOUNTER — Encounter: Payer: Self-pay | Admitting: Internal Medicine

## 2012-06-09 ENCOUNTER — Ambulatory Visit (INDEPENDENT_AMBULATORY_CARE_PROVIDER_SITE_OTHER): Payer: Medicare Other | Admitting: Internal Medicine

## 2012-06-09 VITALS — BP 134/70 | HR 76 | Temp 97.9°F | Ht 62.0 in | Wt 164.0 lb

## 2012-06-09 DIAGNOSIS — K219 Gastro-esophageal reflux disease without esophagitis: Secondary | ICD-10-CM

## 2012-06-09 DIAGNOSIS — Z23 Encounter for immunization: Secondary | ICD-10-CM

## 2012-06-09 DIAGNOSIS — M810 Age-related osteoporosis without current pathological fracture: Secondary | ICD-10-CM | POA: Diagnosis not present

## 2012-06-09 DIAGNOSIS — R1310 Dysphagia, unspecified: Secondary | ICD-10-CM

## 2012-06-09 MED ORDER — PANTOPRAZOLE SODIUM 40 MG PO TBEC: 40.0000 mg | DELAYED_RELEASE_TABLET | Freq: Every day | ORAL | Status: DC

## 2012-06-09 NOTE — Progress Notes (Signed)
  Subjective:    Patient ID: [name], female    DOB: 10/29/1934, 77 y.o.   MRN: [id number]  HPI  Here for follow up -  See CC concerns  Chronic GI issues - abdominal bloat and gas consistent with IBS Also GERD - on PPI or H2B intermittently for same No nausea and vomiting or diarrhea - no bowel changes or weight changes  allergic rhinitis, seasonal symptoms - improved with OTC meds but only takes prn  Past Medical History  Diagnosis Date  . Osteoporosis     dexa 11/2009: -3.5; 01/2012: -3.5 (declines therapy)  . Hyperlipidemia   . Vitamin B12 deficiency   . breast ca 1978  . GERD (gastroesophageal reflux disease)   . IBS (irritable bowel syndrome)     Review of Systems  Constitutional: Positive for fatigue. Negative for fever and unexpected weight change.  HENT: Positive for postnasal drip. Negative for ear pain, sinus pressure and ear discharge.   Respiratory: Negative for cough and shortness of breath.        Objective:   Physical Exam  BP 134/70  Pulse 76  Temp 97.9 F (36.6 C) (Oral)  Ht 5\' 2"  (1.575 m)  Wt 164 lb (74.39 kg)  BMI 30.00 kg/m2  SpO2 94% Wt Readings from Last 3 Encounters:  06/09/12 164 lb (74.39 kg)  01/23/12 166 lb (75.297 kg)  10/09/11 162 lb (73.483 kg)   Constitutional: She is overweight, but appears well-developed and well-nourished. No distress.  Neck: Normal range of motion. Neck supple. No JVD or LAD present. No thyromegaly present. no carotid bruits Cardiovascular: Normal rate, regular rhythm and normal heart sounds.  No murmur heard. No BLE edema. Pulmonary/Chest: Effort normal and breath sounds normal. No respiratory distress. She has no wheezes.  Abdomen: protuberant, SNTND, BS present - no mass Psychiatric: She has moderate anxiety - normal affect. Her behavior is normal. Judgment and thought content normal.   Lab Results  Component Value Date   WBC 4.9 06/27/2011   HGB 12.4 06/27/2011   HCT 36.0 06/27/2011   PLT 298.0  06/27/2011   CHOL 272* 10/09/2011   TRIG 89.0 10/09/2011   HDL 60.60 10/09/2011   LDLDIRECT 183.1 10/09/2011   ALT 13 06/27/2011   AST 16 06/27/2011   NA 139 02/26/2011   K 4.3 02/26/2011   CL 104 02/26/2011   CREATININE 0.8 02/26/2011   BUN 14 02/26/2011   CO2 26 02/26/2011   TSH 1.24 02/26/2011      Assessment & Plan:  See problem list. Medications and labs reviewed today.

## 2012-06-09 NOTE — Assessment & Plan Note (Signed)
Chronic symptoms, complicated by IBS Changed Ranitidine to rx PPI 05/2011 - uses only prn Previously on Nexium with good relief> independently stopped due to concerns of B12 absorption Wishes to try alternate PPI - sister takes Protonix with good relief - will rx same EGD 11/2004 reviewed>>candida, no stricture but dilation performed due to symptomatic dysphagia Refer again now for ?repeat EGD if symptoms not improved with alternate PPI

## 2012-06-09 NOTE — Patient Instructions (Addendum)
It was good to see you today. We have reviewed your prior records including labs and tests today Stop Nexium and start Protonix - Your prescription(s) have been submitted to your pharmacy. Please take as directed and contact our office if you believe you are having problem(s) with the medication(s). Other Medications reviewed, no additional changes at this time. we'll make referral to GI Dr Russella Dar. Our office will contact you regarding appointment(s) once made. Please schedule followup in 6 months, call sooner if problems.

## 2012-07-06 ENCOUNTER — Ambulatory Visit: Payer: Medicare Other | Admitting: Gastroenterology

## 2012-07-08 ENCOUNTER — Encounter: Payer: Self-pay | Admitting: Gastroenterology

## 2012-07-08 ENCOUNTER — Ambulatory Visit (INDEPENDENT_AMBULATORY_CARE_PROVIDER_SITE_OTHER): Payer: Medicare Other | Admitting: Gastroenterology

## 2012-07-08 VITALS — BP 140/64 | HR 72 | Ht 62.5 in | Wt 162.4 lb

## 2012-07-08 DIAGNOSIS — K219 Gastro-esophageal reflux disease without esophagitis: Secondary | ICD-10-CM

## 2012-07-08 MED ORDER — ESOMEPRAZOLE MAGNESIUM 40 MG PO CPDR: 40.0000 mg | DELAYED_RELEASE_CAPSULE | Freq: Two times a day (BID) | ORAL | Status: DC

## 2012-07-08 NOTE — Patient Instructions (Addendum)
Nexium has been increased to twice daily. Use Maalox as needed for any break through symptoms. Follow up as needed with Dr Russella Dar. CC:  Rene Paci MD

## 2012-07-08 NOTE — Progress Notes (Signed)
07/08/2012 [name] [id number] [birth year]   HISTORY OF PRESENT ILLNESS:  Patient is here today complaining of a flare of her reflux.  She says that her symptoms come and go but then seem to improve with a change in her acid reflux therapy.  She was tried on protonix in November, but discontinued that after a few weeks because it caused diarrhea.  Now she is back on Nexium for the last week or so and her symptoms have resolved.  When she does have symptoms she describes feeling like there is something stuck in her esophagus and sometimes foods feel like they are slow to go down.  Also feels like she is very full even after eating small amounts.  Once again, these symptoms are not present currently when her reflux is controlled.  Denies dysphagia to liquids, weight loss, and vomiting.  She had an EGD in 2006, which was relatively unremarkable.  Her esophagus was empirically dilated.  Distal esophageal biopsies showed marked chronic inflammation  Past Medical History  Diagnosis Date  . Osteoporosis     dexa 11/2009: -3.5; 01/2012: -3.5 (declines therapy)  . Hyperlipidemia   . Vitamin B12 deficiency   . breast ca 1978  . GERD (gastroesophageal reflux disease)   . IBS (irritable bowel syndrome)    Past Surgical History  Procedure Date  . Hemorrhoid surgery 1984    With subsequent correction of surgery  . Tubal ligation   . Left mastectomy 1978  . Left breast implant 1981  . Arm surgery     left  . Knee surgery     left  . Neck surgery     c5-6    reports that she has never smoked. She does not have any smokeless tobacco history on file. She reports that she does not drink alcohol or use illicit drugs. family history includes Breast cancer in her maternal aunt and Diabetes in her brother and sister.  There is no history of Colon cancer. No Known Allergies    Outpatient Encounter Prescriptions as of 07/08/2012  Medication Sig Dispense Refill  . aspirin 81 MG tablet Take 81 mg  by mouth daily.        . cholecalciferol (VITAMIN D) 1000 UNITS tablet Take 1,000 Units by mouth daily.        [name] esomeprazole (NEXIUM) 40 MG capsule Take 40 mg by mouth daily.      . polyethylene glycol (MIRALAX / GLYCOLAX) packet Take 17 g by mouth daily as needed.       . Probiotic Product (ADVANCED PROBIOTIC 10 PO) Take 1 capsule by mouth daily.      . vitamin B-12 (CYANOCOBALAMIN) 1000 MCG tablet Take 1,000 mcg by mouth daily.        . calcium carbonate (TUMS EX) 750 MG chewable tablet Chew 2 tablets by mouth as needed.       . Multiple Vitamin (MULTIVITAMIN WITH MINERALS) TABS Take 1 tablet by mouth daily.      . Omega-3 Fatty Acids (FISH OIL) 1200 MG CAPS Take 1 capsule by mouth daily.      . Red Yeast Rice 600 MG TABS Take 2 tablets by mouth daily.      . [DISCONTINUED] Lactobacillus ([employer] PROBIOTIC ACIDOPHILUS) 10 MG CAPS Take 1 capsule by mouth daily.      . [DISCONTINUED] pantoprazole (PROTONIX) 40 MG tablet Take 1 tablet (40 mg total) by mouth daily.  30 tablet  3     REVIEW OF SYSTEMS  :  All other systems reviewed and negative except where noted in the History of Present Illness.   PHYSICAL EXAM: BP 140/64  Pulse 72  Ht 5' 2.5" (1.588 m)  Wt 162 lb 6.4 oz (73.664 kg)  BMI 29.23 kg/m2 General: Well developed white female in no acute distress Head: Normocephalic and atraumatic Eyes:  sclerae anicteric,conjunctive pink. Ears: Normal auditory acuity. Lungs: Clear throughout to auscultation Heart: Regular rate and rhythm Abdomen: Soft, nontender, non distended. No masses or hepatomegaly noted. Normal bowel sounds Musculoskeletal: Symmetrical with no gross deformities  Skin: No lesions on visible extremities Extremities: No edema  Neurological: Alert oriented x 4, grossly nonfocal Psychological:  Alert and cooperative. Normal mood and affect.  Seems very anxious.  ASSESSMENT AND PLAN: -GERD with intermittent symptoms, mostly relieved by PPI therapy.  No alarm symptoms.   Will continue the Nexium and increase it to BID.  Can take maalox as needed.  Follow up as needed.

## 2012-07-15 ENCOUNTER — Ambulatory Visit: Payer: Medicare Other | Admitting: Internal Medicine

## 2012-10-12 DIAGNOSIS — L570 Actinic keratosis: Secondary | ICD-10-CM | POA: Diagnosis not present

## 2012-10-12 DIAGNOSIS — L820 Inflamed seborrheic keratosis: Secondary | ICD-10-CM | POA: Diagnosis not present

## 2012-10-12 DIAGNOSIS — D235 Other benign neoplasm of skin of trunk: Secondary | ICD-10-CM | POA: Diagnosis not present

## 2012-10-20 ENCOUNTER — Other Ambulatory Visit: Payer: Self-pay

## 2012-10-20 DIAGNOSIS — Z1231 Encounter for screening mammogram for malignant neoplasm of breast: Secondary | ICD-10-CM

## 2012-10-20 DIAGNOSIS — Z9012 Acquired absence of left breast and nipple: Secondary | ICD-10-CM

## 2012-11-29 ENCOUNTER — Ambulatory Visit
Admission: RE | Admit: 2012-11-29 | Discharge: 2012-11-29 | Disposition: A | Discharge status: Home / Self Care | Payer: Medicare Other | Source: Ambulatory Visit

## 2012-11-29 DIAGNOSIS — Z1231 Encounter for screening mammogram for malignant neoplasm of breast: Secondary | ICD-10-CM

## 2012-11-29 DIAGNOSIS — Z9012 Acquired absence of left breast and nipple: Secondary | ICD-10-CM

## 2012-12-08 ENCOUNTER — Ambulatory Visit (INDEPENDENT_AMBULATORY_CARE_PROVIDER_SITE_OTHER): Payer: Medicare Other | Admitting: Internal Medicine

## 2012-12-08 ENCOUNTER — Encounter: Payer: Self-pay | Admitting: Internal Medicine

## 2012-12-08 VITALS — BP 132/70 | HR 72 | Temp 98.1°F | Wt 165.8 lb

## 2012-12-08 DIAGNOSIS — E785 Hyperlipidemia, unspecified: Secondary | ICD-10-CM

## 2012-12-08 DIAGNOSIS — Z0000 Encounter for general adult medical examination without abnormal findings: Secondary | ICD-10-CM | POA: Diagnosis not present

## 2012-12-08 DIAGNOSIS — R5381 Other malaise: Secondary | ICD-10-CM

## 2012-12-08 DIAGNOSIS — J309 Allergic rhinitis, unspecified: Secondary | ICD-10-CM

## 2012-12-08 DIAGNOSIS — R5383 Other fatigue: Secondary | ICD-10-CM

## 2012-12-08 MED ORDER — LORATADINE 10 MG PO TABS: 10.0000 mg | ORAL_TABLET | Freq: Every day | ORAL | Status: DC

## 2012-12-08 NOTE — Progress Notes (Signed)
Subjective:    Patient ID: [name], female    DOB: [birth year], 78 y.o.   MRN: [id number]  HPI   Here for medicare wellness  Diet: heart healthy  Physical activity: sedentary Depression/mood screen: negative Hearing: intact to whispered voice Visual acuity: grossly normal, performs annual eye exam  ADLs: capable Fall risk: none Home safety: good Cognitive evaluation: intact to orientation, naming, recall and repetition EOL planning: adv directives, full code/ I agree  I have personally reviewed and have noted 1. The patient's medical and social history 2. Their use of alcohol, tobacco or illicit drugs 3. Their current medications and supplements 4. The patient's functional ability including ADL's, fall risks, home safety risks and hearing or visual impairment. 5. Diet and physical activities 6. Evidence for depression or mood disorders  Also see PA student note  Past Medical History  Diagnosis Date  . Osteoporosis     dexa 11/2009: -3.5; 01/2012: -3.5 (declines therapy)  . Hyperlipidemia   . Vitamin B12 deficiency   . breast ca 1978  . GERD (gastroesophageal reflux disease)   . IBS (irritable bowel syndrome)    Family History  Problem Relation Age of Onset  . Diabetes Sister   . Diabetes Brother   . Breast cancer Maternal Aunt   . Colon cancer Neg Hx    History  Substance Use Topics  . Smoking status: Never Smoker   . Smokeless tobacco: Not on file     Comment: Housewife, Lives with spouse, 2 children  . Alcohol Use: No    Review of Systems Constitutional: Negative for fever or weight change. complains of fatigue Respiratory: Negative for cough and shortness of breath.   Cardiovascular: Negative for chest pain or palpitations.  Gastrointestinal: Negative for abdominal pain, no bowel changes.  Musculoskeletal: Negative for gait problem or joint swelling.  Skin: Negative for rash.  Neurological: Negative for dizziness or headache.  No other specific  complaints in a complete review of systems (except as listed in HPI above).     Objective:   Physical Exam BP 132/70  Pulse 72  Temp(Src) 98.1 F (36.7 C) (Oral)  Wt 165 lb 12.8 oz (75.206 kg)  BMI 29.82 kg/m2  SpO2 98% Wt Readings from Last 3 Encounters:  12/08/12 165 lb 12.8 oz (75.206 kg)  07/08/12 162 lb 6.4 oz (73.664 kg)  06/09/12 164 lb (74.39 kg)   Constitutional: She appears well-developed and well-nourished. No distress.  HENT: Head: Normocephalic and atraumatic. Ears: B TMs ok, no erythema or effusion; Nose: Nose normal. Mouth/Throat: Oropharynx is clear and moist. No oropharyngeal exudate.  Eyes: Conjunctivae and EOM are normal. Pupils are equal, round, and reactive to light. No scleral icterus.  Neck: Normal range of motion. Neck supple. No JVD present. No thyromegaly present.  Cardiovascular: Normal rate, regular rhythm and normal heart sounds.  No murmur heard. No BLE edema. Pulmonary/Chest: Effort normal and breath sounds normal. No respiratory distress. She has no wheezes.  Abdominal: Soft. Bowel sounds are normal. She exhibits no distension. There is no tenderness. no masses Musculoskeletal: Normal range of motion, no joint effusions. No gross deformities Neurological: She is alert and oriented to person, place, and time. No cranial nerve deficit. Coordination, balance, strength, speech and gait are normal.  Skin: Skin is warm and dry. No rash noted. No erythema.  Psychiatric: She has a normal mood and affect. Her behavior is normal. Judgment and thought content normal.   Lab Results  Component Value Date  WBC 4.9 06/27/2011   HGB 12.4 06/27/2011   HCT 36.0 06/27/2011   PLT 298.0 06/27/2011   GLUCOSE 99 02/26/2011   CHOL 272* 10/09/2011   TRIG 89.0 10/09/2011   HDL 60.60 10/09/2011   LDLDIRECT 183.1 10/09/2011   ALT 13 06/27/2011   AST 16 06/27/2011   NA 139 02/26/2011   K 4.3 02/26/2011   CL 104 02/26/2011   CREATININE 0.8 02/26/2011   BUN 14 02/26/2011   CO2 26  02/26/2011   TSH 1.24 02/26/2011       Assessment & Plan:   AWV/v70.0 - Today patient counseled on age appropriate routine health concerns for screening and prevention, each reviewed and up to date or declined. Immunizations reviewed and up to date or declined. Labs reviewed. Risk factors for depression reviewed and negative. Hearing function and visual acuity are intact. ADLs screened and addressed as needed. Functional ability and level of safety reviewed and appropriate. Education, counseling and referrals performed based on assessed risks today. Patient provided with a copy of personalized plan for preventive services.  Fatigue - nonspecific symptoms/exam - check screening labs  Also See problem list. Medications and labs reviewed today.

## 2012-12-08 NOTE — Assessment & Plan Note (Signed)
Intolerant of nasal steroids due to nose bleed Change to alternate antihistamine supportive and symptomatic care

## 2012-12-08 NOTE — Patient Instructions (Addendum)
It was good to see you today. Health Maintenance reviewed - all recommended immunizations and age-appropriate screenings are up-to-date. We have reviewed your prior records including labs and tests today Medications reviewed and updated, try Claritin in place of Zyrtec for allergy symptoms -no other changes recommended at this time. Test(s) ordered today. Return when you are fasting for 8-12h before labs. Your results will be released to MyChart (or called to you) after review, usually within 72hours after test completion. If any changes need to be made, you will be notified at that same time. Please schedule followup in 12 months for annual wellness visit and review, call sooner if problems.   Health Maintenance, Females A healthy lifestyle and preventative care can promote health and wellness.  Maintain regular health, dental, and eye exams.  Eat a healthy diet. Foods like vegetables, fruits, whole grains, low-fat dairy products, and lean protein foods contain the nutrients you need without too many calories. Decrease your intake of foods high in solid fats, added sugars, and salt. Get information about a proper diet from your caregiver, if necessary.  Regular physical exercise is one of the most important things you can do for your health. Most adults should get at least 150 minutes of moderate-intensity exercise (any activity that increases your heart rate and causes you to sweat) each week. In addition, most adults need muscle-strengthening exercises on 2 or more days a week.   Maintain a healthy weight. The body mass index (BMI) is a screening tool to identify possible weight problems. It provides an estimate of body fat based on height and weight. Your caregiver can help determine your BMI, and can help you achieve or maintain a healthy weight. For adults 20 years and older:  A BMI below 18.5 is considered underweight.  A BMI of 18.5 to 24.9 is normal.  A BMI of 25 to 29.9 is  considered overweight.  A BMI of 30 and above is considered obese.  Maintain normal blood lipids and cholesterol by exercising and minimizing your intake of saturated fat. Eat a balanced diet with plenty of fruits and vegetables. Blood tests for lipids and cholesterol should begin at age 90 and be repeated every 5 years. If your lipid or cholesterol levels are high, you are over 50, or you are a high risk for heart disease, you may need your cholesterol levels checked more frequently.Ongoing high lipid and cholesterol levels should be treated with medicines if diet and exercise are not effective.  If you smoke, find out from your caregiver how to quit. If you do not use tobacco, do not start.  If you are pregnant, do not drink alcohol. If you are breastfeeding, be very cautious about drinking alcohol. If you are not pregnant and choose to drink alcohol, do not exceed 1 drink per day. One drink is considered to be 12 ounces (355 mL) of beer, 5 ounces (148 mL) of wine, or 1.5 ounces (44 mL) of liquor.  Avoid use of street drugs. Do not share needles with anyone. Ask for help if you need support or instructions about stopping the use of drugs.  High blood pressure causes heart disease and increases the risk of stroke. Blood pressure should be checked at least every 1 to 2 years. Ongoing high blood pressure should be treated with medicines, if weight loss and exercise are not effective.  If you are 31 to 76 years old, ask your caregiver if you should take aspirin to prevent strokes.  Diabetes screening  involves taking a blood sample to check your fasting blood sugar level. This should be done once every 3 years, after age 34, if you are within normal weight and without risk factors for diabetes. Testing should be considered at a younger age or be carried out more frequently if you are overweight and have at least 1 risk factor for diabetes.  Breast cancer screening is essential preventative care for  women. You should practice "breast self-awareness." This means understanding the normal appearance and feel of your breasts and may include breast self-examination. Any changes detected, no matter how small, should be reported to a caregiver. Women in their 21s and 30s should have a clinical breast exam (CBE) by a caregiver as part of a regular health exam every 1 to 3 years. After age 20, women should have a CBE every year. Starting at age 71, women should consider having a mammogram (breast X-ray) every year. Women who have a family history of breast cancer should talk to their caregiver about genetic screening. Women at a high risk of breast cancer should talk to their caregiver about having an MRI and a mammogram every year.  The Pap test is a screening test for cervical cancer. Women should have a Pap test starting at age 37. Between ages 7 and 27, Pap tests should be repeated every 2 years. Beginning at age 74, you should have a Pap test every 3 years as long as the past 3 Pap tests have been normal. If you had a hysterectomy for a problem that was not cancer or a condition that could lead to cancer, then you no longer need Pap tests. If you are between ages 72 and 46, and you have had normal Pap tests going back 10 years, you no longer need Pap tests. If you have had past treatment for cervical cancer or a condition that could lead to cancer, you need Pap tests and screening for cancer for at least 20 years after your treatment. If Pap tests have been discontinued, risk factors (such as a new sexual partner) need to be reassessed to determine if screening should be resumed. Some women have medical problems that increase the chance of getting cervical cancer. In these cases, your caregiver may recommend more frequent screening and Pap tests.  The human papillomavirus (HPV) test is an additional test that may be used for cervical cancer screening. The HPV test looks for the virus that can cause the cell  changes on the cervix. The cells collected during the Pap test can be tested for HPV. The HPV test could be used to screen women aged 33 years and older, and should be used in women of any age who have unclear Pap test results. After the age of 38, women should have HPV testing at the same frequency as a Pap test.  Colorectal cancer can be detected and often prevented. Most routine colorectal cancer screening begins at the age of 51 and continues through age 18. However, your caregiver may recommend screening at an earlier age if you have risk factors for colon cancer. On a yearly basis, your caregiver may provide home test kits to check for hidden blood in the stool. Use of a small camera at the end of a tube, to directly examine the colon (sigmoidoscopy or colonoscopy), can detect the earliest forms of colorectal cancer. Talk to your caregiver about this at age 1, when routine screening begins. Direct examination of the colon should be repeated every 5 to 10  years through age 13, unless early forms of pre-cancerous polyps or small growths are found.  Hepatitis C blood testing is recommended for all people born from [birth year] through 1963 and any individual with known risks for hepatitis C.  Practice safe sex. Use condoms and avoid high-risk sexual practices to reduce the spread of sexually transmitted infections (STIs). Sexually active women aged 36 and younger should be checked for Chlamydia, which is a common sexually transmitted infection. Older women with new or multiple partners should also be tested for Chlamydia. Testing for other STIs is recommended if you are sexually active and at increased risk.  Osteoporosis is a disease in which the bones lose minerals and strength with aging. This can result in serious bone fractures. The risk of osteoporosis can be identified using a bone density scan. Women ages 64 and over and women at risk for fractures or osteoporosis should discuss screening with their  caregivers. Ask your caregiver whether you should be taking a calcium supplement or vitamin D to reduce the rate of osteoporosis.  Menopause can be associated with physical symptoms and risks. Hormone replacement therapy is available to decrease symptoms and risks. You should talk to your caregiver about whether hormone replacement therapy is right for you.  Use sunscreen with a sun protection factor (SPF) of 30 or greater. Apply sunscreen liberally and repeatedly throughout the day. You should seek shade when your shadow is shorter than you. Protect yourself by wearing long sleeves, pants, a wide-brimmed hat, and sunglasses year round, whenever you are outdoors.  Notify your caregiver of new moles or changes in moles, especially if there is a change in shape or color. Also notify your caregiver if a mole is larger than the size of a pencil eraser.  Stay current with your immunizations. Document Released: 01/27/2011 Document Revised: 10/06/2011 Document Reviewed: 01/27/2011 [facility] Patient Information 2013 [place], [place]. Hay Fever  Hay fever is a type of allergy that people have to things like grass, animals, or pollen from plants and flowers. It cannot be passed from one person to another. You cannot cure hay fever, but there are things that may help relieve your problems (symptoms). HOME CARE  Avoid the things that may be causing your problems.  Take all medicine as told by your doctor. GET HELP RIGHT AWAY IF:  You have asthma, a cough, and you start making whistling sounds when breathing (wheezing).  Your tongue or lips are puffy (swollen).  You have trouble breathing.  You feel lightheaded or like you will pass out (faint).  You have a fever.  Your problems are getting worse and your medicine is not helping.  Your treatment was working, but your problems have come back.  You are stuffed up (congested) and have pressure in your face.  You have a headache.  You have cold  sweats. MAKE SURE YOU:  Understand these instructions.  Will watch your condition.  Will get help right away if you are not doing well or get worse. Document Released: 11/13/2010 Document Revised: 10/06/2011 Document Reviewed: 11/13/2010 [facility] Patient Information 2013 [place], [place].

## 2012-12-08 NOTE — Progress Notes (Signed)
Subjective:    Patient ID: [name], female    DOB: 19-Oct-1934, 78 y.o.   MRN: [id number]  HPI  Patient presents to the office for 6 month follow up of chronic conditions: Also wellness visit - see MD note  GERD:  Patient has a history of GERD.  Was prescribed protonix to use prn 11/ 13, but went to see Dr. Russella Dar who switched this back to esomeprazole.  Patient reports good relief of symptoms.        Osteoporosis:  Patient states that she is doing well.  She does not want to take prolia injections as her dentist has advised against that.  Last DXA scan done in 7/13.  Patient had a T score of -3.5.  This has remained stable for several years.        Allergies:  Patient states that she has had some congestion, sneezing, itchy watery eyes, and aural fullness.  She has tried taking zyrtec at home with little relief.  Hyperlipidemia:  Patient states that she was doing well with exercise, but has not doing much since march.  States that diet has been going well.  She has been using moderate portion.  Past Medical History  Diagnosis Date  . Osteoporosis     dexa 11/2009: -3.5; 01/2012: -3.5 (declines therapy)  . Hyperlipidemia   . Vitamin B12 deficiency   . breast ca 1978  . GERD (gastroesophageal reflux disease)   . IBS (irritable bowel syndrome)    Family History  Problem Relation Age of Onset  . Diabetes Sister   . Diabetes Brother   . Breast cancer Maternal Aunt   . Colon cancer Neg Hx      Review of Systems  Constitutional: Positive for fatigue. Negative for fever and chills.  HENT: Positive for congestion, sore throat, sneezing and postnasal drip.   Respiratory: Positive for cough. Negative for chest tightness, shortness of breath and wheezing.   Cardiovascular: Negative for chest pain and palpitations.  Gastrointestinal: Negative for nausea, vomiting, abdominal pain, diarrhea, constipation and blood in stool.  Allergic/Immunologic: Positive for environmental  allergies. Negative for immunocompromised state.  Neurological: Negative for dizziness, weakness, light-headedness, numbness and headaches.  Psychiatric/Behavioral: Negative for dysphoric mood. The patient is not nervous/anxious.        Objective:   Physical Exam  Nursing note and vitals reviewed. Constitutional: She is oriented to person, place, and time. She appears well-developed and well-nourished. No distress.  HENT:  Head: Normocephalic and atraumatic.  Right Ear: External ear normal. Tympanic membrane is not erythematous. A middle ear effusion is present.  Left Ear: External ear normal. Tympanic membrane is not erythematous. A middle ear effusion is present.  Nose: Mucosal edema present.  Mouth/Throat: Posterior oropharyngeal erythema present.  Cobblestone erythema of the oropharynx  Eyes: Conjunctivae are normal. No scleral icterus.  Neck: Normal range of motion. Neck supple. No thyromegaly present.  Cardiovascular: Normal rate, regular rhythm and normal heart sounds.  Exam reveals no gallop and no friction rub.   No murmur heard. Pulmonary/Chest: Effort normal and breath sounds normal. No respiratory distress. She has no wheezes. She has no rales. She exhibits no tenderness.  Musculoskeletal: Normal range of motion.  Lymphadenopathy:    She has no cervical adenopathy.  Neurological: She is alert and oriented to person, place, and time.  Skin: Skin is warm and dry. She is not diaphoretic.  Psychiatric: She has a normal mood and affect. Her behavior is normal.   Filed Vitals:  12/08/12 0856  BP: 132/70  Pulse: 72  Temp: 98.1 F (36.7 C)  TempSrc: Oral  Weight: 165 lb 12.8 oz (75.206 kg)  SpO2: 98%   Wt Readings from Last 3 Encounters:  12/08/12 165 lb 12.8 oz (75.206 kg)  07/08/12 162 lb 6.4 oz (73.664 kg)  06/09/12 164 lb (74.39 kg)   Lab Results  Component Value Date   WBC 4.9 06/27/2011   HGB 12.4 06/27/2011   HCT 36.0 06/27/2011   PLT 298.0 06/27/2011    GLUCOSE 99 02/26/2011   CHOL 272* 10/09/2011   TRIG 89.0 10/09/2011   HDL 60.60 10/09/2011   LDLDIRECT 183.1 10/09/2011   ALT 13 06/27/2011   AST 16 06/27/2011   NA 139 02/26/2011   K 4.3 02/26/2011   CL 104 02/26/2011   CREATININE 0.8 02/26/2011   BUN 14 02/26/2011   CO2 26 02/26/2011   TSH 1.24 02/26/2011      Assessment & Plan:  Patient presents to the office today for 6 mo. Follow up of chronic medical conditions:  1.  GERD:  Patient reports clinical resolution of symptoms.  No treatment change recommended at this time.  2.  Osteoporosis:  Patient reports no problems at this time.  Inability to take medications due to dental problems.  Will repeat DXA scan in 2015.    3.  Allergies:  HPI and physical exam consistent with allergic rhinitis.  Advised patient to try taking 10 mg loratidine for 1 week.  If that doesn't resolve symptoms she should try Allegra.  Patient to call if symptoms worse, or if they do not resolve with antihistamine use.  4.  Hyperlipidemia:  Patient advised to continue to take red yeast rice, and to continue with low fat diet and to continue aerobic exercise.  Will check lipid panel.  Patient to see [place] back in 6 months for follow-up.  Sooner if problems.      I have personally reviewed this case with PA student. I also personally examined this patient. I agree with history and findings as documented above. I reviewed, discussed and approve of the assessment and plan as listed above. Rene Paci, MD

## 2012-12-08 NOTE — Assessment & Plan Note (Signed)
Intol of statin - has repeatedly declined tx for same - allergy list updated On RYR supplement Also advised to continue working on diet/exercise and weight control Last lipids reviewed - check now as >39mo

## 2012-12-13 ENCOUNTER — Other Ambulatory Visit (INDEPENDENT_AMBULATORY_CARE_PROVIDER_SITE_OTHER): Payer: Medicare Other

## 2012-12-13 DIAGNOSIS — R5383 Other fatigue: Secondary | ICD-10-CM

## 2012-12-13 DIAGNOSIS — E785 Hyperlipidemia, unspecified: Secondary | ICD-10-CM

## 2012-12-13 DIAGNOSIS — R5381 Other malaise: Secondary | ICD-10-CM

## 2012-12-13 LAB — CBC WITH DIFFERENTIAL/PLATELET
Basophils Relative: 1.1 % (ref 0.0–3.0)
Eosinophils Relative: 3.5 % (ref 0.0–5.0)
Lymphocytes Relative: 30.6 % (ref 12.0–46.0)
MCV: 96.2 fl (ref 78.0–100.0)
Monocytes Absolute: 0.6 10*3/uL (ref 0.1–1.0)
Monocytes Relative: 12.4 % — ABNORMAL HIGH (ref 3.0–12.0)
Neutrophils Relative %: 52.4 % (ref 43.0–77.0)
RBC: 3.79 Mil/uL — ABNORMAL LOW (ref 3.87–5.11)
WBC: 4.7 10*3/uL (ref 4.5–10.5)

## 2012-12-13 LAB — LIPID PANEL
Cholesterol: 237 mg/dL — ABNORMAL HIGH (ref 0–200)
Total CHOL/HDL Ratio: 4
Triglycerides: 83 mg/dL (ref 0.0–149.0)
VLDL: 16.6 mg/dL (ref 0.0–40.0)

## 2012-12-13 LAB — BASIC METABOLIC PANEL
Chloride: 107 mEq/L (ref 96–112)
Creatinine, Ser: 0.8 mg/dL (ref 0.4–1.2)
GFR: 76.39 mL/min (ref >60.00)
Potassium: 4.5 mEq/L (ref 3.5–5.1)

## 2012-12-13 LAB — LDL CHOLESTEROL, DIRECT: Direct LDL: 164.6 mg/dL

## 2012-12-13 LAB — HEPATIC FUNCTION PANEL
ALT: 15 U/L (ref 0–35)
Albumin: 3.8 g/dL (ref 3.5–5.2)
Alkaline Phosphatase: 54 U/L (ref 39–117)
Total Protein: 7.1 g/dL (ref 6.0–8.3)

## 2013-02-16 DIAGNOSIS — H2510 Age-related nuclear cataract, unspecified eye: Secondary | ICD-10-CM | POA: Diagnosis not present

## 2013-03-21 ENCOUNTER — Telehealth: Payer: Self-pay | Admitting: *Deleted

## 2013-03-21 NOTE — Telephone Encounter (Signed)
Pt called requesting whether Magnesium 400mg  is safe for her to take.  Please advise

## 2013-03-21 NOTE — Telephone Encounter (Signed)
Yes ok to take

## 2013-03-22 NOTE — Telephone Encounter (Signed)
Left msg advising of MDs message

## 2013-03-29 DIAGNOSIS — L089 Local infection of the skin and subcutaneous tissue, unspecified: Secondary | ICD-10-CM | POA: Diagnosis not present

## 2013-03-29 DIAGNOSIS — L821 Other seborrheic keratosis: Secondary | ICD-10-CM | POA: Diagnosis not present

## 2013-03-29 DIAGNOSIS — L270 Generalized skin eruption due to drugs and medicaments taken internally: Secondary | ICD-10-CM | POA: Diagnosis not present

## 2013-05-02 ENCOUNTER — Encounter: Payer: Self-pay | Admitting: Internal Medicine

## 2013-05-02 ENCOUNTER — Ambulatory Visit (INDEPENDENT_AMBULATORY_CARE_PROVIDER_SITE_OTHER): Payer: Medicare Other | Admitting: Internal Medicine

## 2013-05-02 VITALS — BP 120/62 | HR 75 | Temp 97.4°F | Wt 166.1 lb

## 2013-05-02 DIAGNOSIS — Z23 Encounter for immunization: Secondary | ICD-10-CM | POA: Diagnosis not present

## 2013-05-02 DIAGNOSIS — F411 Generalized anxiety disorder: Secondary | ICD-10-CM

## 2013-05-02 DIAGNOSIS — J309 Allergic rhinitis, unspecified: Secondary | ICD-10-CM | POA: Diagnosis not present

## 2013-05-02 DIAGNOSIS — F418 Other specified anxiety disorders: Secondary | ICD-10-CM

## 2013-05-02 DIAGNOSIS — K5900 Constipation, unspecified: Secondary | ICD-10-CM

## 2013-05-02 DIAGNOSIS — E785 Hyperlipidemia, unspecified: Secondary | ICD-10-CM | POA: Diagnosis not present

## 2013-05-02 NOTE — Assessment & Plan Note (Signed)
Chronic symptoms - advised to resume Miralax OTC prn

## 2013-05-02 NOTE — Patient Instructions (Addendum)
It was good to see you today. We have reviewed your prior records including labs and tests today Your annual flu shot was given and/or updated today. Medications reviewed and updated, no changes recommended at this time. Continue relaxation techniques to control stress and anxiety as discussed, call if symptoms worse consider medications or referral for counseling as needed Please schedule followup in 6 months for your annual wellness evaluation and labs, call sooner if problems.  Anxiety and Panic Attacks Anxiety is your body's way of reacting to real danger or something you think is a danger. It may be fear or worry over a situation like losing your job. Sometimes the cause is not known. A panic attack is made up of physical signs like sweating, shaking, or chest pain. Anxiety and panic attacks may start suddenly. They may be strong. They may come at any time of day, even while sleeping. They may come at any time of life. Panic attacks are scary, but they do not harm you physically.  HOME CARE  Avoid any known causes of your anxiety.  Try to relax. Yoga may help. Tell yourself everything will be okay.  Exercise often.  Get expert advice and help (therapy) to stop anxiety or attacks from happening.  Avoid caffeine, alcohol, and drugs.  Only take medicine as told by your doctor. GET HELP RIGHT AWAY IF:  Your attacks seem different than normal attacks.  Your problems are getting worse or concern you. MAKE SURE YOU:  Understand these instructions.  Will watch your condition.  Will get help right away if you are not doing well or get worse. Document Released: 08/16/2010 Document Revised: 10/06/2011 Document Reviewed: 08/16/2010 [facility] Patient Information 2014 [place], [place].

## 2013-05-02 NOTE — Assessment & Plan Note (Signed)
Intolerant of nasal steroids due to nose bleed Continue OTC oral antihistamine supportive and symptomatic care

## 2013-05-02 NOTE — Assessment & Plan Note (Signed)
Intol of statin - has repeatedly declined tx for same - allergy list updated 11/2012 On RYR supplement Also advised to continue working on diet/exercise and weight control Last lipids reviewed - check annually 

## 2013-05-02 NOTE — Progress Notes (Signed)
Subjective:    Patient ID: [name], female    DOB: [birth year], 78 y.o.   MRN: [id number]  HPI Patient presents to the office for 6 month follow up of chronic conditions:  GERD:  Patient has a history of GERD.  Was prescribed protonix to use prn 11/ 13, but went to see Dr. Russella Dar who switched this back to esomeprazole.  Patient reports good relief of symptoms.        Osteoporosis:  Patient states that she is doing well.  She does not want to take prolia injections as her dentist has advised against that.  Last DEXA done in 7/13.  Patient had a T score of -3.5.  This has remained stable for several years.        Allergies:  Patient states that she has seasonal congestion, sneezing, itchy watery eyes, and aural fullness.  She has tried taking zyrtec at home with little relief.  Hyperlipidemia:  Patient states that she was doing well with exercise, but has not doing much since march.  States that diet has been going well.  She has been using moderate portion.  Past Medical History  Diagnosis Date  . Osteoporosis     dexa 11/2009: -3.5; 01/2012: -3.5 (declines therapy)  . Hyperlipidemia   . Vitamin B12 deficiency   . breast ca 1978  . GERD (gastroesophageal reflux disease)   . IBS (irritable bowel syndrome)     Review of Systems  Constitutional: Negative for fatigue and unexpected weight change.  Respiratory: Negative for cough and shortness of breath.   Cardiovascular: Negative for chest pain and leg swelling.  Psychiatric/Behavioral: Negative for suicidal ideas, dysphoric mood and decreased concentration. The patient is nervous/anxious (situational related to spouse declining health (Pk dz, colitis)).        Objective:   Physical Exam BP 120/62  Pulse 75  Temp(Src) 97.4 F (36.3 C) (Oral)  Wt 166 lb 1.9 oz (75.352 kg)  BMI 29.88 kg/m2  SpO2 97% Wt Readings from Last 3 Encounters:  05/02/13 166 lb 1.9 oz (75.352 kg)  12/08/12 165 lb 12.8 oz (75.206 kg)  07/08/12 162  lb 6.4 oz (73.664 kg)   Constitutional: She is overweight, but appears well-developed and well-nourished. No distress.  HENT: Head: Normocephalic and atraumatic. Ears: B TMs ok, no erythema or effusion; Nose: Nose normal. Mouth/Throat: Oropharynx is clear and moist. No oropharyngeal exudate.  Eyes: Conjunctivae and EOM are normal. Pupils are equal, round, and reactive to light. No scleral icterus.  Neck: Normal range of motion. Neck supple. No JVD present. No thyromegaly present.  Cardiovascular: Normal rate, regular rhythm and normal heart sounds.  No murmur heard. No BLE edema. Pulmonary/Chest: Effort normal and breath sounds normal. No respiratory distress. She has no wheezes.  Abdominal: Soft. Bowel sounds are normal. She exhibits no distension. There is no tenderness. no masses Skin: Skin is warm and dry. No rash noted. No erythema.  Psychiatric: She has a normal mood and affect. Her behavior is normal. Judgment and thought content normal.   Lab Results  Component Value Date   WBC 4.7 12/13/2012   HGB 12.6 12/13/2012   HCT 36.5 12/13/2012   PLT 308.0 12/13/2012   GLUCOSE 92 12/13/2012   CHOL 237* 12/13/2012   TRIG 83.0 12/13/2012   HDL 52.90 12/13/2012   LDLDIRECT 164.6 12/13/2012   ALT 15 12/13/2012   AST 18 12/13/2012   NA 140 12/13/2012   K 4.5 12/13/2012   CL 107 12/13/2012  CREATININE 0.8 12/13/2012   BUN 16 12/13/2012   CO2 26 12/13/2012   TSH 1.99 12/13/2012        Assessment & Plan:   See problem list. Medications and labs reviewed today.  Time spent with pt today 25 minutes, greater than 50% time spent counseling patient on situational anxiety, lipids/diet and medication review. Also review of prior records

## 2013-05-12 ENCOUNTER — Encounter: Payer: Self-pay | Admitting: Internal Medicine

## 2013-05-24 DIAGNOSIS — L439 Lichen planus, unspecified: Secondary | ICD-10-CM | POA: Diagnosis not present

## 2013-06-03 DIAGNOSIS — M542 Cervicalgia: Secondary | ICD-10-CM | POA: Diagnosis not present

## 2013-06-07 DIAGNOSIS — L301 Dyshidrosis [pompholyx]: Secondary | ICD-10-CM | POA: Diagnosis not present

## 2013-06-07 DIAGNOSIS — L439 Lichen planus, unspecified: Secondary | ICD-10-CM | POA: Diagnosis not present

## 2013-06-08 DIAGNOSIS — R293 Abnormal posture: Secondary | ICD-10-CM | POA: Diagnosis not present

## 2013-06-08 DIAGNOSIS — M6281 Muscle weakness (generalized): Secondary | ICD-10-CM | POA: Diagnosis not present

## 2013-06-08 DIAGNOSIS — M542 Cervicalgia: Secondary | ICD-10-CM | POA: Diagnosis not present

## 2013-06-09 ENCOUNTER — Ambulatory Visit (INDEPENDENT_AMBULATORY_CARE_PROVIDER_SITE_OTHER): Payer: Medicare Other | Admitting: Podiatry

## 2013-06-09 ENCOUNTER — Encounter: Payer: Self-pay | Admitting: Podiatry

## 2013-06-09 ENCOUNTER — Ambulatory Visit (INDEPENDENT_AMBULATORY_CARE_PROVIDER_SITE_OTHER): Payer: Medicare Other

## 2013-06-09 VITALS — BP 123/79 | HR 71 | Resp 16 | Ht 62.0 in | Wt 160.0 lb

## 2013-06-09 DIAGNOSIS — R293 Abnormal posture: Secondary | ICD-10-CM | POA: Diagnosis not present

## 2013-06-09 DIAGNOSIS — M79672 Pain in left foot: Secondary | ICD-10-CM

## 2013-06-09 DIAGNOSIS — M79609 Pain in unspecified limb: Secondary | ICD-10-CM

## 2013-06-09 DIAGNOSIS — M722 Plantar fascial fibromatosis: Secondary | ICD-10-CM

## 2013-06-09 DIAGNOSIS — M542 Cervicalgia: Secondary | ICD-10-CM | POA: Diagnosis not present

## 2013-06-09 DIAGNOSIS — M6281 Muscle weakness (generalized): Secondary | ICD-10-CM | POA: Diagnosis not present

## 2013-06-09 MED ORDER — TRIAMCINOLONE ACETONIDE 10 MG/ML IJ SUSP
5.0000 mg | Freq: Once | INTRAMUSCULAR | Status: AC
  Administered 2013-06-09: 5 mg via INTRA_ARTICULAR

## 2013-06-09 NOTE — Patient Instructions (Signed)
Plantar Fasciitis (Heel Spur Syndrome) with Rehab The plantar fascia is a fibrous, ligament-like, soft-tissue structure that spans the bottom of the foot. Plantar fasciitis is a condition that causes pain in the foot due to inflammation of the tissue. SYMPTOMS   Pain and tenderness on the underneath side of the foot.  Pain that worsens with standing or walking. CAUSES  Plantar fasciitis is caused by irritation and injury to the plantar fascia on the underneath side of the foot. Common mechanisms of injury include:  Direct trauma to bottom of the foot.  Damage to a small nerve that runs under the foot where the main fascia attaches to the heel bone.  Stress placed on the plantar fascia due to bone spurs. RISK INCREASES WITH:   Activities that place stress on the plantar fascia (running, jumping, pivoting, or cutting).  Poor strength and flexibility.  Improperly fitted shoes.  Tight calf muscles.  Flat feet.  Failure to warm-up properly before activity.  Obesity. PREVENTION  Warm up and stretch properly before activity.  Allow for adequate recovery between workouts.  Maintain physical fitness:  Strength, flexibility, and endurance.  Cardiovascular fitness.  Maintain a health body weight.  Avoid stress on the plantar fascia.  Wear properly fitted shoes, including arch supports for individuals who have flat feet. PROGNOSIS  If treated properly, then the symptoms of plantar fasciitis usually resolve without surgery. However, occasionally surgery is necessary. RELATED COMPLICATIONS   Recurrent symptoms that may result in a chronic condition.  Problems of the lower back that are caused by compensating for the injury, such as limping.  Pain or weakness of the foot during push-off following surgery.  Chronic inflammation, scarring, and partial or complete fascia tear, occurring more often from repeated injections. TREATMENT  Treatment initially involves the use of  ice and medication to help reduce pain and inflammation. The use of strengthening and stretching exercises may help reduce pain with activity, especially stretches of the Achilles tendon. These exercises may be performed at home or with a therapist. Your caregiver may recommend that you use heel cups of arch supports to help reduce stress on the plantar fascia. Occasionally, corticosteroid injections are given to reduce inflammation. If symptoms persist for greater than 6 months despite non-surgical (conservative), then surgery may be recommended.  MEDICATION   If pain medication is necessary, then nonsteroidal anti-inflammatory medications, such as aspirin and ibuprofen, or other minor pain relievers, such as acetaminophen, are often recommended.  Do not take pain medication within 7 days before surgery.  Prescription pain relievers may be given if deemed necessary by your caregiver. Use only as directed and only as much as you need.  Corticosteroid injections may be given by your caregiver. These injections should be reserved for the most serious cases, because they may only be given a certain number of times. HEAT AND COLD  Cold treatment (icing) relieves pain and reduces inflammation. Cold treatment should be applied for 10 to 15 minutes every 2 to 3 hours for inflammation and pain and immediately after any activity that aggravates your symptoms. Use ice packs or massage the area with a piece of ice (ice massage).  Heat treatment may be used prior to performing the stretching and strengthening activities prescribed by your caregiver, physical therapist, or athletic trainer. Use a heat pack or soak the injury in warm water. SEEK IMMEDIATE MEDICAL CARE IF:  Treatment seems to offer no benefit, or the condition worsens.  Any medications produce adverse side effects. EXERCISES RANGE   OF MOTION (ROM) AND STRETCHING EXERCISES - Plantar Fasciitis (Heel Spur Syndrome) These exercises may help you  when beginning to rehabilitate your injury. Your symptoms may resolve with or without further involvement from your physician, physical therapist or athletic trainer. While completing these exercises, remember:   Restoring tissue flexibility helps normal motion to return to the joints. This allows healthier, less painful movement and activity.  An effective stretch should be held for at least 30 seconds.  A stretch should never be painful. You should only feel a gentle lengthening or release in the stretched tissue. RANGE OF MOTION - Toe Extension, Flexion  Sit with your right / left leg crossed over your opposite knee.  Grasp your toes and gently pull them back toward the top of your foot. You should feel a stretch on the bottom of your toes and/or foot.  Hold this stretch for __________ seconds.  Now, gently pull your toes toward the bottom of your foot. You should feel a stretch on the top of your toes and or foot.  Hold this stretch for __________ seconds. Repeat __________ times. Complete this stretch __________ times per day.  RANGE OF MOTION - Ankle Dorsiflexion, Active Assisted  Remove shoes and sit on a chair that is preferably not on a carpeted surface.  Place right / left foot under knee. Extend your opposite leg for support.  Keeping your heel down, slide your right / left foot back toward the chair until you feel a stretch at your ankle or calf. If you do not feel a stretch, slide your bottom forward to the edge of the chair, while still keeping your heel down.  Hold this stretch for __________ seconds. Repeat __________ times. Complete this stretch __________ times per day.  STRETCH  Gastroc, Standing  Place hands on wall.  Extend right / left leg, keeping the front knee somewhat bent.  Slightly point your toes inward on your back foot.  Keeping your right / left heel on the floor and your knee straight, shift your weight toward the wall, not allowing your back to  arch.  You should feel a gentle stretch in the right / left calf. Hold this position for __________ seconds. Repeat __________ times. Complete this stretch __________ times per day. STRETCH  Soleus, Standing  Place hands on wall.  Extend right / left leg, keeping the other knee somewhat bent.  Slightly point your toes inward on your back foot.  Keep your right / left heel on the floor, bend your back knee, and slightly shift your weight over the back leg so that you feel a gentle stretch deep in your back calf.  Hold this position for __________ seconds. Repeat __________ times. Complete this stretch __________ times per day. STRETCH  Gastrocsoleus, Standing  Note: This exercise can place a lot of stress on your foot and ankle. Please complete this exercise only if specifically instructed by your caregiver.   Place the ball of your right / left foot on a step, keeping your other foot firmly on the same step.  Hold on to the wall or a rail for balance.  Slowly lift your other foot, allowing your body weight to press your heel down over the edge of the step.  You should feel a stretch in your right / left calf.  Hold this position for __________ seconds.  Repeat this exercise with a slight bend in your right / left knee. Repeat __________ times. Complete this stretch __________ times per day.    STRENGTHENING EXERCISES - Plantar Fasciitis (Heel Spur Syndrome)  These exercises may help you when beginning to rehabilitate your injury. They may resolve your symptoms with or without further involvement from your physician, physical therapist or athletic trainer. While completing these exercises, remember:   Muscles can gain both the endurance and the strength needed for everyday activities through controlled exercises.  Complete these exercises as instructed by your physician, physical therapist or athletic trainer. Progress the resistance and repetitions only as guided. STRENGTH - Towel  Curls  Sit in a chair positioned on a non-carpeted surface.  Place your foot on a towel, keeping your heel on the floor.  Pull the towel toward your heel by only curling your toes. Keep your heel on the floor.  If instructed by your physician, physical therapist or athletic trainer, add ____________________ at the end of the towel. Repeat __________ times. Complete this exercise __________ times per day. STRENGTH - Ankle Inversion  Secure one end of a rubber exercise band/tubing to a fixed object (table, pole). Loop the other end around your foot just before your toes.  Place your fists between your knees. This will focus your strengthening at your ankle.  Slowly, pull your big toe up and in, making sure the band/tubing is positioned to resist the entire motion.  Hold this position for __________ seconds.  Have your muscles resist the band/tubing as it slowly pulls your foot back to the starting position. Repeat __________ times. Complete this exercises __________ times per day.  Document Released: 07/14/2005 Document Revised: 10/06/2011 Document Reviewed: 10/26/2008 [employer] Patient Information 2014 [employer]. Plantar Fasciitis Plantar fasciitis is a common condition that causes foot pain. It is soreness (inflammation) of the band of tough fibrous tissue on the bottom of the foot that runs from the heel bone (calcaneus) to the ball of the foot. The cause of this soreness may be from excessive standing, poor fitting shoes, running on hard surfaces, being overweight, having an abnormal walk, or overuse (this is common in runners) of the painful foot or feet. It is also common in aerobic exercise dancers and ballet dancers. SYMPTOMS  Most people with plantar fasciitis complain of:  Severe pain in the morning on the bottom of their foot especially when taking the first steps out of bed. This pain recedes after a few minutes of walking.  Severe pain is experienced also during walking  following a long period of inactivity.  Pain is worse when walking barefoot or up stairs DIAGNOSIS   Your caregiver will diagnose this condition by examining and feeling your foot.  Special tests such as X-rays of your foot, are usually not needed. PREVENTION   Consult a sports medicine professional before beginning a new exercise program.  Walking programs offer a good workout. With walking there is a lower chance of overuse injuries common to runners. There is less impact and less jarring of the joints.  Begin all new exercise programs slowly. If problems or pain develop, decrease the amount of time or distance until you are at a comfortable level.  Wear good shoes and replace them regularly.  Stretch your foot and the heel cords at the back of the ankle (Achilles tendon) both before and after exercise.  Run or exercise on even surfaces that are not hard. For example, asphalt is better than pavement.  Do not run barefoot on hard surfaces.  If using a treadmill, vary the incline.  Do not continue to workout if you have foot or joint   problems. Seek professional help if they do not improve. HOME CARE INSTRUCTIONS   Avoid activities that cause you pain until you recover.  Use ice or cold packs on the problem or painful areas after working out.  Only take over-the-counter or prescription medicines for pain, discomfort, or fever as directed by your caregiver.  Soft shoe inserts or athletic shoes with air or gel sole cushions may be helpful.  If problems continue or become more severe, consult a sports medicine caregiver or your own health care provider. Cortisone is a potent anti-inflammatory medication that may be injected into the painful area. You can discuss this treatment with your caregiver. MAKE SURE YOU:   Understand these instructions.  Will watch your condition.  Will get help right away if you are not doing well or get worse. Document Released: 04/08/2001 Document  Revised: 10/06/2011 Document Reviewed: 06/07/2008 [employer] Patient Information 2014 [employer].  

## 2013-06-09 NOTE — Progress Notes (Signed)
N THROBS, GETS A LITTLE BURNING  L LEFT HEEL AROUND THE SIDE AND SOME ON THE BOTTOM  D 1 MONTH  O ALL OF SUDDEN  C COMES AND GOES  A AFTER BEING ON IT  T NO TREATMENT

## 2013-06-09 NOTE — Progress Notes (Signed)
Subjective:     Patient ID: [name], female   DOB: 1935-04-28, 78 y.o.   MRN: [id number]  Foot Pain   patient states she is having a lot of pain in her left heel that has been present for several months. Getting ready to go to [place] for the winter and is concerned she will not be able to walk   Review of Systems  All other systems reviewed and are negative.       Objective:   Physical Exam  Constitutional: She is oriented to person, place, and time.  Cardiovascular: Intact distal pulses.   Musculoskeletal: Normal range of motion.  Neurological: She is oriented to person, place, and time.  Skin: Skin is warm.   Neurovascular status intact in muscle strength adequate. Tenderness noted at the medial calcaneal tubercle fascially area in the left plantar heel with pain when pressed    Assessment:     Plantar fasciitis of an acute nature left plantar heel    Plan:     H&P reviewed and x-ray. Injected the left plantar fascia 3 mg Kenalog 5 mg Xylocaine Marcaine mixture and applied fascially brace. Reappoint as needed

## 2013-06-13 DIAGNOSIS — M542 Cervicalgia: Secondary | ICD-10-CM | POA: Diagnosis not present

## 2013-06-13 DIAGNOSIS — M6281 Muscle weakness (generalized): Secondary | ICD-10-CM | POA: Diagnosis not present

## 2013-06-13 DIAGNOSIS — R293 Abnormal posture: Secondary | ICD-10-CM | POA: Diagnosis not present

## 2013-06-15 DIAGNOSIS — M6281 Muscle weakness (generalized): Secondary | ICD-10-CM | POA: Diagnosis not present

## 2013-06-15 DIAGNOSIS — R293 Abnormal posture: Secondary | ICD-10-CM | POA: Diagnosis not present

## 2013-06-15 DIAGNOSIS — M542 Cervicalgia: Secondary | ICD-10-CM | POA: Diagnosis not present

## 2013-06-16 DIAGNOSIS — M6281 Muscle weakness (generalized): Secondary | ICD-10-CM | POA: Diagnosis not present

## 2013-06-16 DIAGNOSIS — M542 Cervicalgia: Secondary | ICD-10-CM | POA: Diagnosis not present

## 2013-06-16 DIAGNOSIS — R293 Abnormal posture: Secondary | ICD-10-CM | POA: Diagnosis not present

## 2013-06-20 DIAGNOSIS — M6281 Muscle weakness (generalized): Secondary | ICD-10-CM | POA: Diagnosis not present

## 2013-06-20 DIAGNOSIS — M542 Cervicalgia: Secondary | ICD-10-CM | POA: Diagnosis not present

## 2013-06-20 DIAGNOSIS — R293 Abnormal posture: Secondary | ICD-10-CM | POA: Diagnosis not present

## 2013-06-22 DIAGNOSIS — M6281 Muscle weakness (generalized): Secondary | ICD-10-CM | POA: Diagnosis not present

## 2013-06-22 DIAGNOSIS — R293 Abnormal posture: Secondary | ICD-10-CM | POA: Diagnosis not present

## 2013-06-22 DIAGNOSIS — M542 Cervicalgia: Secondary | ICD-10-CM | POA: Diagnosis not present

## 2013-06-27 DIAGNOSIS — R293 Abnormal posture: Secondary | ICD-10-CM | POA: Diagnosis not present

## 2013-06-27 DIAGNOSIS — M542 Cervicalgia: Secondary | ICD-10-CM | POA: Diagnosis not present

## 2013-06-27 DIAGNOSIS — M6281 Muscle weakness (generalized): Secondary | ICD-10-CM | POA: Diagnosis not present

## 2013-06-29 ENCOUNTER — Telehealth: Payer: Self-pay | Admitting: Internal Medicine

## 2013-06-29 DIAGNOSIS — R293 Abnormal posture: Secondary | ICD-10-CM | POA: Diagnosis not present

## 2013-06-29 DIAGNOSIS — M6281 Muscle weakness (generalized): Secondary | ICD-10-CM | POA: Diagnosis not present

## 2013-06-29 DIAGNOSIS — M542 Cervicalgia: Secondary | ICD-10-CM | POA: Diagnosis not present

## 2013-06-29 NOTE — Telephone Encounter (Signed)
Recd records from [facility], Forwarding 4pgs to [employer]

## 2013-07-01 DIAGNOSIS — M542 Cervicalgia: Secondary | ICD-10-CM | POA: Diagnosis not present

## 2013-08-23 DIAGNOSIS — M542 Cervicalgia: Secondary | ICD-10-CM | POA: Diagnosis not present

## 2013-08-23 DIAGNOSIS — K649 Unspecified hemorrhoids: Secondary | ICD-10-CM | POA: Diagnosis not present

## 2013-08-23 DIAGNOSIS — K219 Gastro-esophageal reflux disease without esophagitis: Secondary | ICD-10-CM | POA: Diagnosis not present

## 2013-08-23 DIAGNOSIS — M129 Arthropathy, unspecified: Secondary | ICD-10-CM | POA: Diagnosis not present

## 2013-09-21 DIAGNOSIS — M25559 Pain in unspecified hip: Secondary | ICD-10-CM | POA: Diagnosis not present

## 2013-09-21 DIAGNOSIS — M129 Arthropathy, unspecified: Secondary | ICD-10-CM | POA: Diagnosis not present

## 2013-09-21 DIAGNOSIS — M79609 Pain in unspecified limb: Secondary | ICD-10-CM | POA: Diagnosis not present

## 2013-09-21 DIAGNOSIS — M549 Dorsalgia, unspecified: Secondary | ICD-10-CM | POA: Diagnosis not present

## 2013-11-07 ENCOUNTER — Other Ambulatory Visit: Payer: Self-pay

## 2013-11-07 DIAGNOSIS — Z853 Personal history of malignant neoplasm of breast: Secondary | ICD-10-CM

## 2013-11-07 DIAGNOSIS — Z9012 Acquired absence of left breast and nipple: Secondary | ICD-10-CM

## 2013-11-07 DIAGNOSIS — Z1231 Encounter for screening mammogram for malignant neoplasm of breast: Secondary | ICD-10-CM

## 2013-11-30 ENCOUNTER — Encounter (INDEPENDENT_AMBULATORY_CARE_PROVIDER_SITE_OTHER): Payer: Self-pay

## 2013-11-30 ENCOUNTER — Ambulatory Visit
Admission: RE | Admit: 2013-11-30 | Discharge: 2013-11-30 | Disposition: A | Discharge status: Home / Self Care | Payer: Medicare Other | Source: Ambulatory Visit

## 2013-11-30 DIAGNOSIS — Z1231 Encounter for screening mammogram for malignant neoplasm of breast: Secondary | ICD-10-CM

## 2013-11-30 DIAGNOSIS — Z853 Personal history of malignant neoplasm of breast: Secondary | ICD-10-CM

## 2013-11-30 DIAGNOSIS — Z9012 Acquired absence of left breast and nipple: Secondary | ICD-10-CM

## 2013-12-09 ENCOUNTER — Encounter: Payer: Medicare Other | Admitting: Internal Medicine

## 2013-12-13 ENCOUNTER — Encounter: Payer: Medicare Other | Admitting: Internal Medicine

## 2013-12-15 DIAGNOSIS — H2510 Age-related nuclear cataract, unspecified eye: Secondary | ICD-10-CM | POA: Diagnosis not present

## 2013-12-16 ENCOUNTER — Encounter: Payer: Self-pay | Admitting: Internal Medicine

## 2013-12-16 ENCOUNTER — Ambulatory Visit (INDEPENDENT_AMBULATORY_CARE_PROVIDER_SITE_OTHER): Payer: Medicare Other | Admitting: Internal Medicine

## 2013-12-16 ENCOUNTER — Ambulatory Visit (INDEPENDENT_AMBULATORY_CARE_PROVIDER_SITE_OTHER)
Admission: RE | Admit: 2013-12-16 | Discharge: 2013-12-16 | Disposition: A | Discharge status: Home / Self Care | Payer: Medicare Other | Source: Ambulatory Visit | Attending: Internal Medicine | Admitting: Internal Medicine

## 2013-12-16 VITALS — BP 130/72 | HR 70 | Temp 98.2°F | Ht 62.0 in | Wt 169.8 lb

## 2013-12-16 DIAGNOSIS — M25551 Pain in right hip: Secondary | ICD-10-CM

## 2013-12-16 DIAGNOSIS — R5383 Other fatigue: Secondary | ICD-10-CM

## 2013-12-16 DIAGNOSIS — IMO0001 Reserved for inherently not codable concepts without codable children: Secondary | ICD-10-CM

## 2013-12-16 DIAGNOSIS — E663 Overweight: Secondary | ICD-10-CM

## 2013-12-16 DIAGNOSIS — E785 Hyperlipidemia, unspecified: Secondary | ICD-10-CM | POA: Diagnosis not present

## 2013-12-16 DIAGNOSIS — Z136 Encounter for screening for cardiovascular disorders: Secondary | ICD-10-CM

## 2013-12-16 DIAGNOSIS — M25559 Pain in unspecified hip: Secondary | ICD-10-CM

## 2013-12-16 DIAGNOSIS — R030 Elevated blood-pressure reading, without diagnosis of hypertension: Secondary | ICD-10-CM

## 2013-12-16 DIAGNOSIS — Z0000 Encounter for general adult medical examination without abnormal findings: Secondary | ICD-10-CM | POA: Diagnosis not present

## 2013-12-16 DIAGNOSIS — R5381 Other malaise: Secondary | ICD-10-CM

## 2013-12-16 DIAGNOSIS — M810 Age-related osteoporosis without current pathological fracture: Secondary | ICD-10-CM

## 2013-12-16 NOTE — Assessment & Plan Note (Signed)
Intol of statin - has repeatedly declined tx for same - allergy list updated 11/2012 On RYR supplement Also advised to continue working on diet/exercise and weight control Last lipids reviewed - check annually 

## 2013-12-16 NOTE — Assessment & Plan Note (Signed)
BP Readings from Last 3 Encounters:  12/16/13 130/72  06/09/13 123/79  05/02/13 120/62   The current medical regimen is effective;  continue present plan and medications.

## 2013-12-16 NOTE — Progress Notes (Signed)
Pre visit review using our clinic review tool, if applicable. No additional management support is needed unless otherwise documented below in the visit note. 

## 2013-12-16 NOTE — Patient Instructions (Addendum)
It was good to see you today.  We have reviewed your prior records including labs and tests today  Health Maintenance reviewed - all recommended immunizations and age-appropriate screenings are up-to-date.  Test(s) ordered today. Return next week when you are fasting for labs and Xray of hip today. Your results will be released to [place] (or called to you) after review, usually within 72hours after test completion. If any changes need to be made, you will be notified at that same time.  Medications reviewed and updated, no changes recommended at this time. Ok to use Aleve or Tylenol for hip pain.  we'll make referral to physical therapy. Our office will contact you regarding appointment(s) once made.  Please schedule followup in 12 months for annual exam and labs, call sooner if problems.  Health Maintenance, Female A healthy lifestyle and preventative care can promote health and wellness.  Maintain regular health, dental, and eye exams.  Eat a healthy diet. Foods like vegetables, fruits, whole grains, low-fat dairy products, and lean protein foods contain the nutrients you need without too many calories. Decrease your intake of foods high in solid fats, added sugars, and salt. Get information about a proper diet from your caregiver, if necessary.  Regular physical exercise is one of the most important things you can do for your health. Most adults should get at least 150 minutes of moderate-intensity exercise (any activity that increases your heart rate and causes you to sweat) each week. In addition, most adults need muscle-strengthening exercises on 2 or more days a week.   Maintain a healthy weight. The body mass index (BMI) is a screening tool to identify possible weight problems. It provides an estimate of body fat based on height and weight. Your caregiver can help determine your BMI, and can help you achieve or maintain a healthy weight. For adults 20 years and older:  A BMI below  18.5 is considered underweight.  A BMI of 18.5 to 24.9 is normal.  A BMI of 25 to 29.9 is considered overweight.  A BMI of 30 and above is considered obese.  Maintain normal blood lipids and cholesterol by exercising and minimizing your intake of saturated fat. Eat a balanced diet with plenty of fruits and vegetables. Blood tests for lipids and cholesterol should begin at age 61 and be repeated every 5 years. If your lipid or cholesterol levels are high, you are over 50, or you are a high risk for heart disease, you may need your cholesterol levels checked more frequently.Ongoing high lipid and cholesterol levels should be treated with medicines if diet and exercise are not effective.  If you smoke, find out from your caregiver how to quit. If you do not use tobacco, do not start.  Lung cancer screening is recommended for adults aged 73 80 years who are at high risk for developing lung cancer because of a history of smoking. Yearly low-dose computed tomography (CT) is recommended for people who have at least a 30-pack-year history of smoking and are a current smoker or have quit within the past 15 years. A pack year of smoking is smoking an average of 1 pack of cigarettes a day for 1 year (for example: 1 pack a day for 30 years or 2 packs a day for 15 years). Yearly screening should continue until the smoker has stopped smoking for at least 15 years. Yearly screening should also be stopped for people who develop a health problem that would prevent them from having lung cancer  treatment.  If you are pregnant, do not drink alcohol. If you are breastfeeding, be very cautious about drinking alcohol. If you are not pregnant and choose to drink alcohol, do not exceed 1 drink per day. One drink is considered to be 12 ounces (355 mL) of beer, 5 ounces (148 mL) of wine, or 1.5 ounces (44 mL) of liquor.  Avoid use of street drugs. Do not share needles with anyone. Ask for help if you need support or  instructions about stopping the use of drugs.  High blood pressure causes heart disease and increases the risk of stroke. Blood pressure should be checked at least every 1 to 2 years. Ongoing high blood pressure should be treated with medicines, if weight loss and exercise are not effective.  If you are 37 to 77 years old, ask your caregiver if you should take aspirin to prevent strokes.  Diabetes screening involves taking a blood sample to check your fasting blood sugar level. This should be done once every 3 years, after age 51, if you are within normal weight and without risk factors for diabetes. Testing should be considered at a younger age or be carried out more frequently if you are overweight and have at least 1 risk factor for diabetes.  Breast cancer screening is essential preventative care for women. You should practice "breast self-awareness." This means understanding the normal appearance and feel of your breasts and may include breast self-examination. Any changes detected, no matter how small, should be reported to a caregiver. Women in their 64s and 30s should have a clinical breast exam (CBE) by a caregiver as part of a regular health exam every 1 to 3 years. After age 71, women should have a CBE every year. Starting at age 67, women should consider having a mammogram (breast X-ray) every year. Women who have a family history of breast cancer should talk to their caregiver about genetic screening. Women at a high risk of breast cancer should talk to their caregiver about having an MRI and a mammogram every year.  Breast cancer gene (BRCA)-related cancer risk assessment is recommended for women who have family members with BRCA-related cancers. BRCA-related cancers include breast, ovarian, tubal, and peritoneal cancers. Having family members with these cancers may be associated with an increased risk for harmful changes (mutations) in the breast cancer genes BRCA1 and BRCA2. Results of the  assessment will determine the need for genetic counseling and BRCA1 and BRCA2 testing.  The Pap test is a screening test for cervical cancer. Women should have a Pap test starting at age 40. Between ages 59 and 63, Pap tests should be repeated every 2 years. Beginning at age 74, you should have a Pap test every 3 years as long as the past 3 Pap tests have been normal. If you had a hysterectomy for a problem that was not cancer or a condition that could lead to cancer, then you no longer need Pap tests. If you are between ages 21 and 29, and you have had normal Pap tests going back 10 years, you no longer need Pap tests. If you have had past treatment for cervical cancer or a condition that could lead to cancer, you need Pap tests and screening for cancer for at least 20 years after your treatment. If Pap tests have been discontinued, risk factors (such as a new sexual partner) need to be reassessed to determine if screening should be resumed. Some women have medical problems that increase the chance of  getting cervical cancer. In these cases, your caregiver may recommend more frequent screening and Pap tests.  The human papillomavirus (HPV) test is an additional test that may be used for cervical cancer screening. The HPV test looks for the virus that can cause the cell changes on the cervix. The cells collected during the Pap test can be tested for HPV. The HPV test could be used to screen women aged 70 years and older, and should be used in women of any age who have unclear Pap test results. After the age of 81, women should have HPV testing at the same frequency as a Pap test.  Colorectal cancer can be detected and often prevented. Most routine colorectal cancer screening begins at the age of 27 and continues through age 33. However, your caregiver may recommend screening at an earlier age if you have risk factors for colon cancer. On a yearly basis, your caregiver may provide home test kits to check for  hidden blood in the stool. Use of a small camera at the end of a tube, to directly examine the colon (sigmoidoscopy or colonoscopy), can detect the earliest forms of colorectal cancer. Talk to your caregiver about this at age 77, when routine screening begins. Direct examination of the colon should be repeated every 5 to 10 years through age 40, unless early forms of pre-cancerous polyps or small growths are found.  Hepatitis C blood testing is recommended for all people born from [birth year] through 1963 and any individual with known risks for hepatitis C.  Practice safe sex. Use condoms and avoid high-risk sexual practices to reduce the spread of sexually transmitted infections (STIs). Sexually active women aged 53 and younger should be checked for Chlamydia, which is a common sexually transmitted infection. Older women with new or multiple partners should also be tested for Chlamydia. Testing for other STIs is recommended if you are sexually active and at increased risk.  Osteoporosis is a disease in which the bones lose minerals and strength with aging. This can result in serious bone fractures. The risk of osteoporosis can be identified using a bone density scan. Women ages 20 and over and women at risk for fractures or osteoporosis should discuss screening with their caregivers. Ask your caregiver whether you should be taking a calcium supplement or vitamin D to reduce the rate of osteoporosis.  Menopause can be associated with physical symptoms and risks. Hormone replacement therapy is available to decrease symptoms and risks. You should talk to your caregiver about whether hormone replacement therapy is right for you.  Use sunscreen. Apply sunscreen liberally and repeatedly throughout the day. You should seek shade when your shadow is shorter than you. Protect yourself by wearing long sleeves, pants, a wide-brimmed hat, and sunglasses year round, whenever you are outdoors.  Notify your caregiver of new  moles or changes in moles, especially if there is a change in shape or color. Also notify your caregiver if a mole is larger than the size of a pencil eraser.  Stay current with your immunizations. Document Released: 01/27/2011 Document Revised: 11/08/2012 Document Reviewed: 01/27/2011 [facility] Patient Information 2014 [place]. Hip Pain The hips join the upper legs to the lower pelvis. The bones, cartilage, tendons, and muscles of the hip joint perform a lot of work each day holding your body weight and allowing you to move around. Hip pain is a common symptom. It can range from a minor ache to severe pain on 1 or both hips. Pain may be  felt on the inside of the hip joint near the groin, or the outside near the buttocks and upper thigh. There may be swelling or stiffness as well. It occurs more often when a person walks or performs activity. There are many reasons hip pain can develop. CAUSES  It is important to work with your caregiver to identify the cause since many conditions can impact the bones, cartilage, muscles, and tendons of the hips. Causes for hip pain include:  Broken (fractured) bones.  Separation of the thighbone from the hip socket (dislocation).  Torn cartilage of the hip joint.  Swelling (inflammation) of a tendon (tendonitis), the sac within the hip joint (bursitis), or a joint.  A weakening in the abdominal wall (hernia), affecting the nerves to the hip.  Arthritis in the hip joint or lining of the hip joint.  Pinched nerves in the back, hip, or upper thigh.  A bulging disc in the spine (herniated disc).  Rarely, bone infection or cancer. DIAGNOSIS  The location of your hip pain will help your caregiver understand what may be causing the pain. A diagnosis is based on your medical history, your symptoms, results from your physical exam, and results from diagnostic tests. Diagnostic tests may include X-ray exams, a computerized magnetic scan (magnetic resonance  imaging, MRI), or bone scan. TREATMENT  Treatment will depend on the cause of your hip pain. Treatment may include:  Limiting activities and resting until symptoms improve.  Crutches or other walking supports (a cane or brace).  Ice, elevation, and compression.  Physical therapy or home exercises.  Shoe inserts or special shoes.  Losing weight.  Medications to reduce pain.  Undergoing surgery. HOME CARE INSTRUCTIONS   Only take over-the-counter or prescription medicines for pain, discomfort, or fever as directed by your caregiver.  Put ice on the injured area:  Put ice in a plastic bag.  Place a towel between your skin and the bag.  Leave the ice on for 15-20 minutes at a time, 03-04 times a day.  Keep your leg raised (elevated) when possible to lessen swelling.  Avoid activities that cause pain.  Follow specific exercises as directed by your caregiver.  Sleep with a pillow between your legs on your most comfortable side.  Record how often you have hip pain, the location of the pain, and what it feels like. This information may be helpful to you and your caregiver.  Ask your caregiver about returning to work or sports and whether you should drive.  Follow up with your caregiver for further exams, therapy, or testing as directed. SEEK MEDICAL CARE IF:   Your pain or swelling continues or worsens after 1 week.  You are feeling unwell or have chills.  You have increasing difficulty with walking.  You have a loss of sensation or other new symptoms.  You have questions or concerns. SEEK IMMEDIATE MEDICAL CARE IF:   You cannot put weight on the affected hip.  You have fallen.  You have a sudden increase in pain and swelling in your hip.  You have a fever. MAKE SURE YOU:   Understand these instructions.  Will watch your condition.  Will get help right away if you are not doing well or get worse. Document Released: 01/01/2010 Document Revised: 10/06/2011  Document Reviewed: 01/01/2010 [facility] Patient Information 2014 [place].

## 2013-12-16 NOTE — Assessment & Plan Note (Signed)
Hx reviewed - no specific treatment due to contraindication or side effect Continued vitamin D with calcium as tolerated and weightbearing exercise Last DEXA summer 2013 reviewed -

## 2013-12-16 NOTE — Assessment & Plan Note (Signed)
Wt Readings from Last 3 Encounters:  12/16/13 169 lb 12.8 oz (77.021 kg)  06/09/13 160 lb (72.576 kg)  05/02/13 166 lb 1.9 oz (75.352 kg)   Discussed diet/exercise and healthy life style habits Encouraged pt to follow up nutritionist (private pay)

## 2013-12-16 NOTE — Progress Notes (Signed)
Subjective:    Patient ID: [name], female    DOB: [birth year], 79 y.o.   MRN: [id number]  HPI   Here for medicare wellness  Diet: heart healthy or DM if diabetic Physical activity: sedentary Depression/mood screen: negative Hearing: intact to whispered voice Visual acuity: grossly normal, performs annual eye exam  ADLs: capable Fall risk: none Home safety: good Cognitive evaluation: intact to orientation, naming, recall and repetition EOL planning: adv directives, full code/ I agree  I have personally reviewed and have noted 1. The patient's medical and social history 2. Their use of alcohol, tobacco or illicit drugs 3. Their current medications and supplements 4. The patient's functional ability including ADL's, fall risks, home safety risks and hearing or visual impairment. 5. Diet and physical activities 6. Evidence for depression or mood disorders  Also reviewed chronic medical issues and interval medical events  Past Medical History  Diagnosis Date  . Osteoporosis     dexa 11/2009: -3.5; 01/2012: -3.5 (declines therapy)  . Hyperlipidemia   . Vitamin B12 deficiency   . breast ca 1978  . GERD (gastroesophageal reflux disease)   . IBS (irritable bowel syndrome)    Family History  Problem Relation Age of Onset  . Diabetes Sister   . Diabetes Brother   . Breast cancer Maternal Aunt   . Colon cancer Neg Hx    History  Substance Use Topics  . Smoking status: Never Smoker   . Smokeless tobacco: Not on file     Comment: Housewife, Lives with spouse, 2 children  . Alcohol Use: No    Review of Systems  Constitutional: Positive for fatigue. Negative for unexpected weight change.  Respiratory: Negative for cough, shortness of breath and wheezing.   Cardiovascular: Negative for chest pain, palpitations and leg swelling.  Gastrointestinal: Negative for nausea, abdominal pain and diarrhea.  Musculoskeletal: Positive for arthralgias (R>L hip x 3 months  following increase walking program in [place], no radiation) and gait problem (because of "hip" pain x 3 mo). Negative for joint swelling.  Skin: Negative for rash and wound.  Neurological: Negative for dizziness, weakness, light-headedness and headaches.  Psychiatric/Behavioral: Negative for dysphoric mood. The patient is not nervous/anxious.   All other systems reviewed and are negative.      Objective:   Physical Exam  BP 130/72  Pulse 70  Temp(Src) 98.2 F (36.8 C) (Oral)  Ht 5\' 2"  (1.575 m)  Wt 169 lb 12.8 oz (77.021 kg)  BMI 31.05 kg/m2  SpO2 96% Wt Readings from Last 3 Encounters:  12/16/13 169 lb 12.8 oz (77.021 kg)  06/09/13 160 lb (72.576 kg)  05/02/13 166 lb 1.9 oz (75.352 kg)   Constitutional: She is overweight, but appears well-developed and well-nourished. No distress.  Neck: Normal range of motion. Neck supple. No JVD present. No thyromegaly present.  Cardiovascular: Normal rate, regular rhythm and normal heart sounds.  No murmur heard. No BLE edema. Pulmonary/Chest: Effort normal and breath sounds normal. No respiratory distress. She has no wheezes.  MSkel: R hip: tender over groin with decreased internal and external rotation - also tender to palp over troch bursa on R Psychiatric: She has a normal mood and affect. Her behavior is normal. Judgment and thought content normal.   Lab Results  Component Value Date   WBC 4.7 12/13/2012   HGB 12.6 12/13/2012   HCT 36.5 12/13/2012   PLT 308.0 12/13/2012   GLUCOSE 92 12/13/2012   CHOL 237* 12/13/2012   TRIG 83.0  12/13/2012   HDL 52.90 12/13/2012   LDLDIRECT 164.6 12/13/2012   ALT 15 12/13/2012   AST 18 12/13/2012   NA 140 12/13/2012   K 4.5 12/13/2012   CL 107 12/13/2012   CREATININE 0.8 12/13/2012   BUN 16 12/13/2012   CO2 26 12/13/2012   TSH 1.99 12/13/2012    Mm Digital Screening Unilat R  11/30/2013   CLINICAL DATA:  Screening.  EXAM: DIGITAL SCREENING UNILATERAL RIGHT MAMMOGRAM WITH CAD  COMPARISON:  Previous exam(s).   ACR Breast Density Category b: There are scattered areas of fibroglandular density.  FINDINGS: There are no findings suspicious for malignancy. Images were processed with CAD.  IMPRESSION: No mammographic evidence of malignancy. A result letter of this screening mammogram will be mailed directly to the patient.  RECOMMENDATION: Screening mammogram in one year. (Code:[id number]-B-01Y)  BI-RADS CATEGORY  1: Negative.   Electronically Signed   By: Lillia Mountain M.D.   On: 11/30/2013 12:01   ECG normal sinus at 67 beats per minute. no ischemic change or arrhythmia    Assessment & Plan:   AWV/v70.0 - Today patient counseled on age appropriate routine health concerns for screening and prevention, each reviewed and up to date or declined. Immunizations reviewed and up to date or declined. Labs/ECG reviewed. Risk factors for depression reviewed and negative. Hearing function and visual acuity are intact. ADLs screened and addressed as needed. Functional ability and level of safety reviewed and appropriate. Education, counseling and referrals performed based on assessed risks today. Patient provided with a copy of personalized plan for preventive services.  R hip pain -  also greater trochanteric bursitis exacerbated by overuse -  excess walking - check DG and refer to PT recommended OTC NSAIDs and call if worse with PT  Fatigue - nonspecific symptoms/exam - check screening labs  Problem List Items Addressed This Visit   DYSLIPIDEMIA     Intol of statin - has repeatedly declined tx for same - allergy list updated 11/2012 On RYR supplement Also advised to continue working on diet/exercise and weight control Last lipids reviewed - check annually    Relevant Orders      Lipid panel   Elevated blood pressure      BP Readings from Last 3 Encounters:  12/16/13 130/72  06/09/13 123/79  05/02/13 120/62   The current medical regimen is effective;  continue present plan and medications.     Relevant Orders       Basic metabolic panel   FATIGUE   Relevant Orders      Basic metabolic panel      CBC with Differential      Hepatic function panel      TSH   OSTEOPOROSIS     Hx reviewed - no specific treatment due to contraindication or side effect Continued vitamin D with calcium as tolerated and weightbearing exercise Last DEXA summer 2013 reviewed -     Overweight      Wt Readings from Last 3 Encounters:  12/16/13 169 lb 12.8 oz (77.021 kg)  06/09/13 160 lb (72.576 kg)  05/02/13 166 lb 1.9 oz (75.352 kg)   Discussed diet/exercise and healthy life style habits Encouraged pt to follow up nutritionist (private pay)     Other Visit Diagnoses   Routine general medical examination at a health care facility    -  Primary    Relevant Orders       Lipid panel    Screening for other and unspecified cardiovascular  conditions        Relevant Orders       EKG 12-Lead (Completed)    Right hip pain        Relevant Orders       Ambulatory referral to Physical Therapy       DG Hip Complete Right

## 2013-12-20 ENCOUNTER — Other Ambulatory Visit (INDEPENDENT_AMBULATORY_CARE_PROVIDER_SITE_OTHER): Payer: Medicare Other

## 2013-12-20 DIAGNOSIS — R030 Elevated blood-pressure reading, without diagnosis of hypertension: Secondary | ICD-10-CM

## 2013-12-20 DIAGNOSIS — E785 Hyperlipidemia, unspecified: Secondary | ICD-10-CM | POA: Diagnosis not present

## 2013-12-20 DIAGNOSIS — IMO0001 Reserved for inherently not codable concepts without codable children: Secondary | ICD-10-CM

## 2013-12-20 DIAGNOSIS — R5383 Other fatigue: Secondary | ICD-10-CM

## 2013-12-20 DIAGNOSIS — R5381 Other malaise: Secondary | ICD-10-CM | POA: Diagnosis not present

## 2013-12-20 DIAGNOSIS — Z0000 Encounter for general adult medical examination without abnormal findings: Secondary | ICD-10-CM

## 2013-12-20 LAB — BASIC METABOLIC PANEL
BUN: 15 mg/dL (ref 6–23)
CO2: 29 meq/L (ref 19–32)
Calcium: 9.1 mg/dL (ref 8.4–10.5)
Chloride: 107 mEq/L (ref 96–112)
Creatinine, Ser: 0.8 mg/dL (ref 0.4–1.2)
GFR: 77.33 mL/min (ref >60.00)
Glucose, Bld: 103 mg/dL — ABNORMAL HIGH (ref 70–99)
Potassium: 4.7 mEq/L (ref 3.5–5.1)
SODIUM: 142 meq/L (ref 135–145)

## 2013-12-20 LAB — LIPID PANEL
CHOLESTEROL: 247 mg/dL — AB (ref 0–200)
HDL: 55.4 mg/dL (ref >39.00)
LDL CALC: 171 mg/dL — AB (ref 0–99)
Total CHOL/HDL Ratio: 4
Triglycerides: 104 mg/dL (ref 0.0–149.0)
VLDL: 20.8 mg/dL (ref 0.0–40.0)

## 2013-12-20 LAB — CBC WITH DIFFERENTIAL/PLATELET
BASOS PCT: 0.6 % (ref 0.0–3.0)
Basophils Absolute: 0 10*3/uL (ref 0.0–0.1)
EOS ABS: 0.2 10*3/uL (ref 0.0–0.7)
Eosinophils Relative: 3.3 % (ref 0.0–5.0)
HCT: 37.8 % (ref 36.0–46.0)
HEMOGLOBIN: 12.7 g/dL (ref 12.0–15.0)
Lymphocytes Relative: 29.5 % (ref 12.0–46.0)
Lymphs Abs: 1.7 10*3/uL (ref 0.7–4.0)
MCHC: 33.7 g/dL (ref 30.0–36.0)
MCV: 96.9 fl (ref 78.0–100.0)
MONO ABS: 0.6 10*3/uL (ref 0.1–1.0)
Monocytes Relative: 10.6 % (ref 3.0–12.0)
NEUTROS ABS: 3.3 10*3/uL (ref 1.4–7.7)
Neutrophils Relative %: 56 % (ref 43.0–77.0)
Platelets: 318 10*3/uL (ref 150.0–400.0)
RBC: 3.89 Mil/uL (ref 3.87–5.11)
RDW: 13.6 % (ref 11.5–15.5)
WBC: 5.9 10*3/uL (ref 4.0–10.5)

## 2013-12-20 LAB — HEPATIC FUNCTION PANEL
ALBUMIN: 3.8 g/dL (ref 3.5–5.2)
ALT: 12 U/L (ref 0–35)
AST: 15 U/L (ref 0–37)
Alkaline Phosphatase: 51 U/L (ref 39–117)
Bilirubin, Direct: 0 mg/dL (ref 0.0–0.3)
Total Bilirubin: 0.7 mg/dL (ref 0.2–1.2)
Total Protein: 6.8 g/dL (ref 6.0–8.3)

## 2013-12-20 LAB — TSH: TSH: 2.52 u[IU]/mL (ref 0.35–4.50)

## 2014-03-31 DIAGNOSIS — L570 Actinic keratosis: Secondary | ICD-10-CM | POA: Diagnosis not present

## 2014-03-31 DIAGNOSIS — D235 Other benign neoplasm of skin of trunk: Secondary | ICD-10-CM | POA: Diagnosis not present

## 2014-05-18 DIAGNOSIS — H0014 Chalazion left upper eyelid: Secondary | ICD-10-CM | POA: Diagnosis not present

## 2014-05-30 DIAGNOSIS — H0014 Chalazion left upper eyelid: Secondary | ICD-10-CM | POA: Diagnosis not present

## 2014-05-30 DIAGNOSIS — H0015 Chalazion left lower eyelid: Secondary | ICD-10-CM | POA: Diagnosis not present

## 2014-05-31 ENCOUNTER — Ambulatory Visit (INDEPENDENT_AMBULATORY_CARE_PROVIDER_SITE_OTHER): Payer: Medicare Other

## 2014-05-31 ENCOUNTER — Ambulatory Visit: Payer: Medicare Other

## 2014-05-31 DIAGNOSIS — Z23 Encounter for immunization: Secondary | ICD-10-CM

## 2014-06-19 ENCOUNTER — Other Ambulatory Visit (INDEPENDENT_AMBULATORY_CARE_PROVIDER_SITE_OTHER): Payer: Medicare Other

## 2014-06-19 ENCOUNTER — Encounter: Payer: Self-pay | Admitting: Internal Medicine

## 2014-06-19 ENCOUNTER — Ambulatory Visit (INDEPENDENT_AMBULATORY_CARE_PROVIDER_SITE_OTHER): Payer: Medicare Other | Admitting: Internal Medicine

## 2014-06-19 VITALS — BP 122/60 | HR 75 | Temp 97.8°F | Ht 62.0 in | Wt 159.8 lb

## 2014-06-19 DIAGNOSIS — E663 Overweight: Secondary | ICD-10-CM

## 2014-06-19 DIAGNOSIS — Z23 Encounter for immunization: Secondary | ICD-10-CM | POA: Diagnosis not present

## 2014-06-19 DIAGNOSIS — M810 Age-related osteoporosis without current pathological fracture: Secondary | ICD-10-CM

## 2014-06-19 DIAGNOSIS — R030 Elevated blood-pressure reading, without diagnosis of hypertension: Secondary | ICD-10-CM | POA: Diagnosis not present

## 2014-06-19 DIAGNOSIS — H0015 Chalazion left lower eyelid: Secondary | ICD-10-CM | POA: Diagnosis not present

## 2014-06-19 DIAGNOSIS — R739 Hyperglycemia, unspecified: Secondary | ICD-10-CM

## 2014-06-19 DIAGNOSIS — IMO0001 Reserved for inherently not codable concepts without codable children: Secondary | ICD-10-CM

## 2014-06-19 DIAGNOSIS — E785 Hyperlipidemia, unspecified: Secondary | ICD-10-CM

## 2014-06-19 LAB — HEMOGLOBIN A1C: HEMOGLOBIN A1C: 6.1 % (ref 4.6–6.5)

## 2014-06-19 MED ORDER — ESOMEPRAZOLE MAGNESIUM 40 MG PO CPDR: 40.0000 mg | DELAYED_RELEASE_CAPSULE | Freq: Two times a day (BID) | ORAL | Status: DC

## 2014-06-19 NOTE — Assessment & Plan Note (Signed)
Intol of statin - has repeatedly declined tx for same - allergy list updated 11/2012 On RYR supplement Also advised to continue working on diet/exercise and weight control Last lipids reviewed - check annually

## 2014-06-19 NOTE — Progress Notes (Signed)
Subjective:    Patient ID: [name], female    DOB: 24-[name]-1935, 79 y.o.   MRN: [id number]  HPI  Patient is here for follow up  Reviewed chronic medical issues and interval medical events  Past Medical History  Diagnosis Date  . Osteoporosis     dexa 11/2009: -3.5; 01/2012: -3.5 (declines therapy)  . Hyperlipidemia   . Vitamin B12 deficiency   . breast ca 1978  . GERD (gastroesophageal reflux disease)   . IBS (irritable bowel syndrome)     Review of Systems  Constitutional: Negative for fatigue and unexpected weight change (intentional wt reduction).  Respiratory: Negative for cough and shortness of breath.   Cardiovascular: Negative for chest pain, palpitations and leg swelling.       Objective:   Physical Exam  BP 122/60 mmHg  Pulse 75  Temp(Src) 97.8 F (36.6 C) (Oral)  Ht 5\' 2"  (1.575 m)  Wt 159 lb 12 oz (72.462 kg)  BMI 29.21 kg/m2  SpO2 99% Wt Readings from Last 3 Encounters:  06/19/14 159 lb 12 oz (72.462 kg)  12/16/13 169 lb 12.8 oz (77.021 kg)  06/09/13 160 lb (72.576 kg)   Constitutional: She is overweight, appears well-developed and well-nourished. No distress.  Neck: Normal range of motion. Neck supple. No JVD present. No thyromegaly present.  Cardiovascular: Normal rate, regular rhythm and normal heart sounds.  No murmur heard. No BLE edema. Pulmonary/Chest: Effort normal and breath sounds normal. No respiratory distress. She has no wheezes.  Psychiatric: She has a normal mood and affect. Her behavior is normal. Judgment and thought content normal.   Lab Results  Component Value Date   WBC 5.9 12/20/2013   HGB 12.7 12/20/2013   HCT 37.8 12/20/2013   PLT 318.0 12/20/2013   GLUCOSE 103* 12/20/2013   CHOL 247* 12/20/2013   TRIG 104.0 12/20/2013   HDL 55.40 12/20/2013   LDLDIRECT 164.6 12/13/2012   LDLCALC 171* 12/20/2013   ALT 12 12/20/2013   AST 15 12/20/2013   NA 142 12/20/2013   K 4.7 12/20/2013   CL 107 12/20/2013   CREATININE  0.8 12/20/2013   BUN 15 12/20/2013   CO2 29 12/20/2013   TSH 2.52 12/20/2013    Dg Hip Complete Right  12/16/2013   CLINICAL DATA:  Right hip pain without injury.  EXAM: RIGHT HIP - COMPLETE 2+ VIEW  COMPARISON:  None.  FINDINGS: There is no evidence of hip fracture or dislocation. There is no evidence of arthropathy or other focal bone abnormality.  IMPRESSION: Normal right hip.   Electronically Signed   By: Sabino Dick M.D.   On: 12/16/2013 14:55       Assessment & Plan:   Problem List Items Addressed This Visit    Elevated blood pressure    BP Readings from Last 3 Encounters:  06/19/14 122/60  12/16/13 130/72  06/09/13 123/79   No med rx at present - control with wt mgmt  The current medical regimen is effective;  continue present plan and medications.     Hyperglycemia    No prior dx DM - but +FH of same controls same with wt reduction and low cab diet with portion control Check a1c The patient is asked to make an attempt to improve diet and exercise patterns to aid in medical management of this problem.     Relevant Orders      Hemoglobin A1c   Hyperlipidemia    Intol of statin - has repeatedly declined  tx for same - allergy list updated 11/2012 On RYR supplement Also advised to continue working on diet/exercise and weight control Last lipids reviewed - check annually    Osteoporosis - Primary    Hx reviewed - no specific treatment due to contraindication or side effect Continued vitamin D with calcium as tolerated and weightbearing exercise Last DEXA summer 2013 reviewed - order new test now No falls or new bone pain Prior Fosamax not tolerated because of GI upset and dysphagia Has been advised not to take Prolia by dentist due to risk of peridental dz      Relevant Orders      DG Bone Density   Overweight    Wt Readings from Last 3 Encounters:  06/19/14 159 lb 12 oz (72.462 kg)  12/16/13 169 lb 12.8 oz (77.021 kg)  06/09/13 160 lb (72.576 kg)   Discussed  diet/exercise and healthy life style habits - encouragement provide to continue same trend (10# down in past 6 mo, intentional) Encouraged pt to follow up nutritionist (private pay)

## 2014-06-19 NOTE — Assessment & Plan Note (Signed)
BP Readings from Last 3 Encounters:  06/19/14 122/60  12/16/13 130/72  06/09/13 123/79   No med rx at present - control with wt mgmt  The current medical regimen is effective;  continue present plan and medications.

## 2014-06-19 NOTE — Assessment & Plan Note (Signed)
Wt Readings from Last 3 Encounters:  06/19/14 159 lb 12 oz (72.462 kg)  12/16/13 169 lb 12.8 oz (77.021 kg)  06/09/13 160 lb (72.576 kg)   Discussed diet/exercise and healthy life style habits - encouragement provide to continue same trend (10# down in past 6 mo, intentional) Encouraged pt to follow up nutritionist (private pay)

## 2014-06-19 NOTE — Progress Notes (Signed)
Pre visit review using our clinic review tool, if applicable. No additional management support is needed unless otherwise documented below in the visit note. 

## 2014-06-19 NOTE — Patient Instructions (Addendum)
It was good to see you today.  We have reviewed your prior records including labs and tests today  We will order follow-up bone density scan (DEXA) -if continued progressive bone loss, will consider Prolia injections or other therapy (Forteo weekly, non-pill forms to avoid GI upset)  Prevnar (pneumonia 13) immunization administered today  Test(s) ordered today. Your results will be released to [place] (or called to you) after review, usually within 72hours after test completion. If any changes need to be made, you will be notified at that same time.  Medications reviewed and updated, no changes recommended at this time. Refill on medication(s) as discussed today.  Please schedule followup in 6 months for annual exam, call sooner if problems.  Osteoporosis Throughout your life, your body breaks down old bone and replaces it with new bone. As you get older, your body does not replace bone as quickly as it breaks it down. By the age of 83 years, most people begin to gradually lose bone because of the imbalance between bone loss and replacement. Some people lose more bone than others. Bone loss beyond a specified normal degree is considered osteoporosis.  Osteoporosis affects the strength and durability of your bones. The inside of the ends of your bones and your flat bones, like the bones of your pelvis, look like honeycomb, filled with tiny open spaces. As bone loss occurs, your bones become less dense. This means that the open spaces inside your bones become bigger and the walls between these spaces become thinner. This makes your bones weaker. Bones of a person with osteoporosis can become so weak that they can break (fracture) during minor accidents, such as a simple fall. CAUSES  The following factors have been associated with the development of osteoporosis:  Smoking.  Drinking more than 2 alcoholic drinks several days per week.  Long-term use of certain  medicines:  Corticosteroids.  Chemotherapy medicines.  Thyroid medicines.  Antiepileptic medicines.  Gonadal hormone suppression medicine.  Immunosuppression medicine.  Being underweight.  Lack of physical activity.  Lack of exposure to the sun. This can lead to vitamin D deficiency.  Certain medical conditions:  Certain inflammatory bowel diseases, such as Crohn disease and ulcerative colitis.  Diabetes.  Hyperthyroidism.  Hyperparathyroidism. RISK FACTORS Anyone can develop osteoporosis. However, the following factors can increase your risk of developing osteoporosis:  Gender--Women are at higher risk than men.  Age--Being older than 50 years increases your risk.  Ethnicity--White and Asian people have an increased risk.  Weight --Being extremely underweight can increase your risk of osteoporosis.  Family history of osteoporosis--Having a family member who has developed osteoporosis can increase your risk. SYMPTOMS  Usually, people with osteoporosis have no symptoms.  DIAGNOSIS  Signs during a physical exam that may prompt your caregiver to suspect osteoporosis include:  Decreased height. This is usually caused by the compression of the bones that form your spine (vertebrae) because they have weakened and become fractured.  A curving or rounding of the upper back (kyphosis). To confirm signs of osteoporosis, your caregiver may request a procedure that uses 2 low-dose X-ray beams with different levels of energy to measure your bone mineral density (dual-energy X-ray absorptiometry [DXA]). Also, your caregiver may check your level of vitamin D. TREATMENT  The goal of osteoporosis treatment is to strengthen bones in order to decrease the risk of bone fractures. There are different types of medicines available to help achieve this goal. Some of these medicines work by slowing the processes of  bone loss. Some medicines work by increasing bone density. Treatment also  involves making sure that your levels of calcium and vitamin D are adequate. PREVENTION  There are things you can do to help prevent osteoporosis. Adequate intake of calcium and vitamin D can help you achieve optimal bone mineral density. Regular exercise can also help, especially resistance and weight-bearing activities. If you smoke, quitting smoking is an important part of osteoporosis prevention. MAKE SURE YOU:  Understand these instructions.  Will watch your condition.  Will get help right away if you are not doing well or get worse. FOR MORE INFORMATION www.osteo.org and [employer].ee Document Released: 04/23/2005 Document Revised: 11/08/2012 Document Reviewed: 06/28/2011 [facility] Patient Information 2015 [place], [place]. This information is not intended to replace advice given to you by your health care provider. Make sure you discuss any questions you have with your health care provider.

## 2014-06-19 NOTE — Assessment & Plan Note (Signed)
No prior dx DM - but +FH of same controls same with wt reduction and low cab diet with portion control Check a1c The patient is asked to make an attempt to improve diet and exercise patterns to aid in medical management of this problem.

## 2014-06-19 NOTE — Assessment & Plan Note (Signed)
Hx reviewed - no specific treatment due to contraindication or side effect Continued vitamin D with calcium as tolerated and weightbearing exercise Last DEXA summer 2013 reviewed - order new test now No falls or new bone pain Prior Fosamax not tolerated because of GI upset and dysphagia Has been advised not to take Prolia by dentist due to risk of peridental dz

## 2014-06-20 ENCOUNTER — Ambulatory Visit (INDEPENDENT_AMBULATORY_CARE_PROVIDER_SITE_OTHER)
Admission: RE | Admit: 2014-06-20 | Discharge: 2014-06-20 | Disposition: A | Discharge status: Home / Self Care | Payer: Medicare Other | Source: Ambulatory Visit | Attending: Internal Medicine | Admitting: Internal Medicine

## 2014-06-20 DIAGNOSIS — M810 Age-related osteoporosis without current pathological fracture: Secondary | ICD-10-CM

## 2014-07-14 ENCOUNTER — Telehealth: Payer: Self-pay | Admitting: Internal Medicine

## 2014-07-14 NOTE — Telephone Encounter (Signed)
Pt returning call from 12/3 and Dexa scan results. Pt just got message as she is in [place] until April, please call her [phone] and leave her a message.

## 2014-07-17 NOTE — Telephone Encounter (Signed)
Called pt back and she has an appt in May and wants to speak with PCP in more detail about the Prolia then.

## 2014-08-25 DIAGNOSIS — M129 Arthropathy, unspecified: Secondary | ICD-10-CM | POA: Diagnosis not present

## 2014-08-25 DIAGNOSIS — M5030 Other cervical disc degeneration, unspecified cervical region: Secondary | ICD-10-CM | POA: Diagnosis not present

## 2014-08-25 DIAGNOSIS — M542 Cervicalgia: Secondary | ICD-10-CM | POA: Diagnosis not present

## 2014-08-25 DIAGNOSIS — Q782 Osteopetrosis: Secondary | ICD-10-CM | POA: Diagnosis not present

## 2014-11-09 ENCOUNTER — Other Ambulatory Visit: Payer: Self-pay

## 2014-11-09 DIAGNOSIS — Z1231 Encounter for screening mammogram for malignant neoplasm of breast: Secondary | ICD-10-CM

## 2014-11-09 DIAGNOSIS — Z9012 Acquired absence of left breast and nipple: Secondary | ICD-10-CM

## 2014-11-24 DIAGNOSIS — H01001 Unspecified blepharitis right upper eyelid: Secondary | ICD-10-CM | POA: Diagnosis not present

## 2014-11-24 DIAGNOSIS — H01004 Unspecified blepharitis left upper eyelid: Secondary | ICD-10-CM | POA: Diagnosis not present

## 2014-12-06 ENCOUNTER — Ambulatory Visit
Admission: RE | Admit: 2014-12-06 | Discharge: 2014-12-06 | Disposition: A | Discharge status: Home / Self Care | Payer: Medicare Other | Source: Ambulatory Visit

## 2014-12-06 DIAGNOSIS — Z1231 Encounter for screening mammogram for malignant neoplasm of breast: Secondary | ICD-10-CM | POA: Diagnosis not present

## 2014-12-06 DIAGNOSIS — Z9012 Acquired absence of left breast and nipple: Secondary | ICD-10-CM

## 2014-12-08 DIAGNOSIS — H0015 Chalazion left lower eyelid: Secondary | ICD-10-CM | POA: Diagnosis not present

## 2014-12-20 ENCOUNTER — Encounter: Payer: Self-pay | Admitting: Internal Medicine

## 2014-12-20 ENCOUNTER — Other Ambulatory Visit (INDEPENDENT_AMBULATORY_CARE_PROVIDER_SITE_OTHER): Payer: Medicare Other

## 2014-12-20 ENCOUNTER — Ambulatory Visit (INDEPENDENT_AMBULATORY_CARE_PROVIDER_SITE_OTHER): Payer: Medicare Other | Admitting: Internal Medicine

## 2014-12-20 VITALS — BP 128/62 | HR 68 | Temp 97.8°F | Ht 62.0 in | Wt 161.0 lb

## 2014-12-20 DIAGNOSIS — E785 Hyperlipidemia, unspecified: Secondary | ICD-10-CM

## 2014-12-20 DIAGNOSIS — M810 Age-related osteoporosis without current pathological fracture: Secondary | ICD-10-CM | POA: Diagnosis not present

## 2014-12-20 DIAGNOSIS — R739 Hyperglycemia, unspecified: Secondary | ICD-10-CM

## 2014-12-20 DIAGNOSIS — Z0000 Encounter for general adult medical examination without abnormal findings: Secondary | ICD-10-CM

## 2014-12-20 LAB — HEMOGLOBIN A1C: Hgb A1c MFr Bld: 5.9 % (ref 4.6–6.5)

## 2014-12-20 LAB — BASIC METABOLIC PANEL
BUN: 20 mg/dL (ref 6–23)
CALCIUM: 8.9 mg/dL (ref 8.4–10.5)
CO2: 27 meq/L (ref 19–32)
Chloride: 106 mEq/L (ref 96–112)
Creatinine, Ser: 0.92 mg/dL (ref 0.40–1.20)
GFR: 62.8 mL/min (ref >60.00)
GLUCOSE: 108 mg/dL — AB (ref 70–99)
Potassium: 4.6 mEq/L (ref 3.5–5.1)
Sodium: 140 mEq/L (ref 135–145)

## 2014-12-20 LAB — LIPID PANEL
CHOL/HDL RATIO: 4
Cholesterol: 235 mg/dL — ABNORMAL HIGH (ref 0–200)
HDL: 56.2 mg/dL (ref >39.00)
LDL CALC: 159 mg/dL — AB (ref 0–99)
NonHDL: 178.8
Triglycerides: 101 mg/dL (ref 0.0–149.0)
VLDL: 20.2 mg/dL (ref 0.0–40.0)

## 2014-12-20 NOTE — Patient Instructions (Addendum)
It was good to see you today.  We have reviewed your prior records including labs and tests today  Health Maintenance reviewed - all recommended immunizations and age-appropriate screenings are up-to-date.  Test(s) ordered today. Your results will be released to [place] (or called to you) after review, usually within 72hours after test completion. If any changes need to be made, you will be notified at that same time.  Medications reviewed and updated, no changes recommended at this time.  Ok to take [employer] program for your bone strengthening and let me know when you are ready to resume physical theapry  Please schedule followup in 6-12 months for semi-annual exam and labs, call sooner if problems.

## 2014-12-20 NOTE — Assessment & Plan Note (Signed)
Intol of statin - has repeatedly declined tx for same - allergy list updated 11/2012 Previously on RYR supplement, none since 2015 Also advised to continue working on diet/exercise and weight control Last lipids reviewed - check annually

## 2014-12-20 NOTE — Assessment & Plan Note (Signed)
No prior dx DM, but +FH of same controls same with wt reduction and low cab diet with portion control Check a1c q6-97mo The patient is asked to make an attempt to improve diet and exercise patterns to aid in medical management of this problem Lab Results  Component Value Date   HGBA1C 6.1 06/19/2014

## 2014-12-20 NOTE — Progress Notes (Signed)
Pre visit review using our clinic review tool, if applicable. No additional management support is needed unless otherwise documented below in the visit note. 

## 2014-12-20 NOTE — Progress Notes (Signed)
Subjective:    Patient ID: [name], female    DOB: 11/15/1934, 80 y.o.   MRN: [id number]  HPI  patient is here today for annual physical.  Reviewed chronic conditions, interval events and current concerns  Diet: heart healthy, low carbc Physical activity: active 30-40" 3-4x/wk Depression/mood screen: negative Hearing: intact to whispered voice Visual acuity: grossly normal, performs annual eye exam  ADLs: capable Fall risk: none Home safety: good Cognitive evaluation: intact to orientation, naming, recall and repetition EOL planning: adv directives, full code/ I agree  I have personally reviewed and have noted 1. The patient's medical and social history 2. Their use of alcohol, tobacco or illicit drugs 3. Their current medications and supplements 4. The patient's functional ability including ADL's, fall risks, home safety risks and hearing or visual impairment. 5. Diet and physical activities 6. Evidence for depression or mood disorders   Past Medical History  Diagnosis Date  . Osteoporosis     dexa 11/2009: -3.5; 01/2012: -3.5 (declines therapy)  . Hyperlipidemia   . Vitamin B12 deficiency   . breast ca 1978  . GERD (gastroesophageal reflux disease)   . IBS (irritable bowel syndrome)    Family History  Problem Relation Age of Onset  . Diabetes Sister   . Diabetes Brother   . Breast cancer Maternal Aunt   . Colon cancer Neg Hx    History  Substance Use Topics  . Smoking status: Never Smoker   . Smokeless tobacco: Not on file  . Alcohol Use: No    Review of Systems  Constitutional: Positive for fatigue. Negative for unexpected weight change.  Respiratory: Negative for cough, shortness of breath and wheezing.   Cardiovascular: Negative for chest pain, palpitations and leg swelling.  Gastrointestinal: Positive for constipation (chronic, uses Miralax prn - no change in sx). Negative for nausea, abdominal pain and diarrhea.  Neurological: Negative for  dizziness, weakness, light-headedness and headaches.  Psychiatric/Behavioral: Negative for dysphoric mood. The patient is not nervous/anxious.   All other systems reviewed and are negative.      Objective:    Physical Exam  Constitutional: She is oriented to person, place, and time. She appears well-developed and well-nourished. No distress.  HENT:  Head: Normocephalic and atraumatic.  Right Ear: External ear normal.  Left Ear: External ear normal.  Nose: Nose normal.  Mouth/Throat: Oropharynx is clear and moist. No oropharyngeal exudate.  Eyes: EOM are normal. Pupils are equal, round, and reactive to light. Right eye exhibits no discharge. Left eye exhibits no discharge. No scleral icterus.  Neck: Normal range of motion. Neck supple. No JVD present. No tracheal deviation present. No thyromegaly present.  Cardiovascular: Normal rate, regular rhythm, normal heart sounds and intact distal pulses.  Exam reveals no friction rub.   No murmur heard. Pulmonary/Chest: Effort normal and breath sounds normal. No respiratory distress. She has no wheezes. She has no rales. She exhibits no tenderness.  Abdominal: Soft. Bowel sounds are normal. She exhibits no distension and no mass. There is no tenderness. There is no rebound and no guarding.  Genitourinary:  Defer to gyn  Musculoskeletal: Normal range of motion.  No gross deformities  Lymphadenopathy:    She has no cervical adenopathy.  Neurological: She is alert and oriented to person, place, and time. She has normal reflexes. No cranial nerve deficit.  Skin: Skin is warm and dry. No rash noted. She is not diaphoretic. No erythema.  Psychiatric: She has a normal mood and affect. Her  behavior is normal. Judgment and thought content normal.  Nursing note and vitals reviewed.   BP 128/62 mmHg  Pulse 68  Temp(Src) 97.8 F (36.6 C) (Oral)  Ht 5\' 2"  (1.575 m)  Wt 161 lb (73.029 kg)  BMI 29.44 kg/m2  SpO2 97% Wt Readings from Last 3  Encounters:  12/20/14 161 lb (73.029 kg)  06/19/14 159 lb 12 oz (72.462 kg)  12/16/13 169 lb 12.8 oz (77.021 kg)    Lab Results  Component Value Date   WBC 5.9 12/20/2013   HGB 12.7 12/20/2013   HCT 37.8 12/20/2013   PLT 318.0 12/20/2013   GLUCOSE 103* 12/20/2013   CHOL 247* 12/20/2013   TRIG 104.0 12/20/2013   HDL 55.40 12/20/2013   LDLDIRECT 164.6 12/13/2012   LDLCALC 171* 12/20/2013   ALT 12 12/20/2013   AST 15 12/20/2013   NA 142 12/20/2013   K 4.7 12/20/2013   CL 107 12/20/2013   CREATININE 0.8 12/20/2013   BUN 15 12/20/2013   CO2 29 12/20/2013   TSH 2.52 12/20/2013   HGBA1C 6.1 06/19/2014    Mm Digital Screening Unilat R  12/06/2014   CLINICAL DATA:  Screening.  EXAM: DIGITAL SCREENING UNILATERAL RIGHT MAMMOGRAM WITH CAD  COMPARISON:  Previous exam(s).  ACR Breast Density Category b: There are scattered areas of fibroglandular density.  FINDINGS: The patient has had a left mastectomy. There are no findings suspicious for malignancy. Images were processed with CAD.  IMPRESSION: No mammographic evidence of malignancy. A result letter of this screening mammogram will be mailed directly to the patient.  RECOMMENDATION: Screening mammogram in one year. (Code:SM-B-01Y)  BI-RADS CATEGORY  1: Negative.   Electronically Signed   By: Altamese Cabal M.D.   On: 12/06/2014 11:01       Assessment & Plan:   CPX/z00.00 - Today patient counseled on age appropriate routine health concerns for screening and prevention, each reviewed and up to date or declined. Immunizations reviewed and up to date or declined. Labs ordered and reviewed. Risk factors for depression reviewed and negative. Hearing function and visual acuity are intact. ADLs screened and addressed as needed. Functional ability and level of safety reviewed and appropriate. Education, counseling and referrals performed based on assessed risks today. Patient provided with a copy of personalized plan for preventive  services.  Problem List Items Addressed This Visit    Hyperglycemia    No prior dx DM, but +FH of same controls same with wt reduction and low cab diet with portion control Check a1c q6-45mo The patient is asked to make an attempt to improve diet and exercise patterns to aid in medical management of this problem Lab Results  Component Value Date   HGBA1C 6.1 06/19/2014        Relevant Orders   Hemoglobin Z3G   Basic metabolic panel   Lipid panel   Hyperlipidemia    Intol of statin - has repeatedly declined tx for same - allergy list updated 11/2012 Previously on RYR supplement, none since 2015 Also advised to continue working on diet/exercise and weight control Last lipids reviewed - check annually      Relevant Orders   Lipid panel   Osteoporosis    Hx reviewed - no specific treatment due to contraindication or side effect Continued vitamin D with calcium as tolerated and weightbearing exercise - planning to join "[employer]" program (supervised weight training) Last DEXA 05/2014 reviewed - order new test now No falls or new bone pain Prior Fosamax not  tolerated because of GI upset and dysphagia Has been advised not to take Prolia by dentist due to risk of peridental dz       Other Visit Diagnoses    Routine general medical examination at a health care facility    -  Primary        Gwendolyn Grant, MD

## 2014-12-20 NOTE — Assessment & Plan Note (Signed)
Hx reviewed - no specific treatment due to contraindication or side effect Continued vitamin D with calcium as tolerated and weightbearing exercise - planning to join "[employer]" program (supervised weight training) Last DEXA 05/2014 reviewed - order new test now No falls or new bone pain Prior Fosamax not tolerated because of GI upset and dysphagia Has been advised not to take Prolia by dentist due to risk of peridental dz

## 2015-01-04 DIAGNOSIS — H524 Presbyopia: Secondary | ICD-10-CM | POA: Diagnosis not present

## 2015-01-04 DIAGNOSIS — H0015 Chalazion left lower eyelid: Secondary | ICD-10-CM | POA: Diagnosis not present

## 2015-01-04 DIAGNOSIS — H0014 Chalazion left upper eyelid: Secondary | ICD-10-CM | POA: Diagnosis not present

## 2015-01-10 DIAGNOSIS — Z6829 Body mass index (BMI) 29.0-29.9, adult: Secondary | ICD-10-CM | POA: Diagnosis not present

## 2015-01-10 DIAGNOSIS — M542 Cervicalgia: Secondary | ICD-10-CM | POA: Diagnosis not present

## 2015-01-10 DIAGNOSIS — Z981 Arthrodesis status: Secondary | ICD-10-CM | POA: Diagnosis not present

## 2015-01-25 DIAGNOSIS — H2513 Age-related nuclear cataract, bilateral: Secondary | ICD-10-CM | POA: Diagnosis not present

## 2015-01-25 DIAGNOSIS — H0015 Chalazion left lower eyelid: Secondary | ICD-10-CM | POA: Diagnosis not present

## 2015-02-28 DIAGNOSIS — Z4789 Encounter for other orthopedic aftercare: Secondary | ICD-10-CM | POA: Diagnosis not present

## 2015-02-28 DIAGNOSIS — M25562 Pain in left knee: Secondary | ICD-10-CM | POA: Diagnosis not present

## 2015-03-08 DIAGNOSIS — H0015 Chalazion left lower eyelid: Secondary | ICD-10-CM | POA: Diagnosis not present

## 2015-03-13 DIAGNOSIS — M25562 Pain in left knee: Secondary | ICD-10-CM | POA: Diagnosis not present

## 2015-03-22 DIAGNOSIS — M25562 Pain in left knee: Secondary | ICD-10-CM | POA: Diagnosis not present

## 2015-04-09 ENCOUNTER — Ambulatory Visit (INDEPENDENT_AMBULATORY_CARE_PROVIDER_SITE_OTHER)
Admission: RE | Admit: 2015-04-09 | Discharge: 2015-04-09 | Disposition: A | Discharge status: Home / Self Care | Payer: Medicare Other | Source: Ambulatory Visit | Attending: Internal Medicine | Admitting: Internal Medicine

## 2015-04-09 ENCOUNTER — Telehealth: Payer: Self-pay | Admitting: Emergency Medicine

## 2015-04-09 ENCOUNTER — Ambulatory Visit (INDEPENDENT_AMBULATORY_CARE_PROVIDER_SITE_OTHER): Payer: Medicare Other | Admitting: Internal Medicine

## 2015-04-09 ENCOUNTER — Encounter: Payer: Self-pay | Admitting: Internal Medicine

## 2015-04-09 VITALS — BP 110/58 | HR 83 | Temp 98.5°F | Resp 16 | Ht 62.0 in | Wt 162.0 lb

## 2015-04-09 DIAGNOSIS — M546 Pain in thoracic spine: Secondary | ICD-10-CM | POA: Insufficient documentation

## 2015-04-09 DIAGNOSIS — R0789 Other chest pain: Secondary | ICD-10-CM

## 2015-04-09 DIAGNOSIS — S22069A Unspecified fracture of T7-T8 vertebra, initial encounter for closed fracture: Secondary | ICD-10-CM | POA: Diagnosis not present

## 2015-04-09 DIAGNOSIS — S22060A Wedge compression fracture of T7-T8 vertebra, initial encounter for closed fracture: Secondary | ICD-10-CM | POA: Diagnosis not present

## 2015-04-09 DIAGNOSIS — M549 Dorsalgia, unspecified: Secondary | ICD-10-CM | POA: Diagnosis not present

## 2015-04-09 MED ORDER — HYDROCODONE-ACETAMINOPHEN 5-325 MG PO TABS: 1.0000 | ORAL_TABLET | Freq: Four times a day (QID) | ORAL | Status: DC | PRN

## 2015-04-09 NOTE — Progress Notes (Signed)
Subjective:  Patient ID: [name], female    DOB: [name] 30, 1935  Age: 80 y.o. MRN: [id number]  CC: Back Pain and Chest Pain   HPI [name] presents for one-week history of worsening pain in the middle part of her back. The pain radiates around into bilateral anterior rib cage. She was exercising when the pain occurred but denies any specific trauma or injury.  Outpatient Prescriptions Prior to Visit  Medication Sig Dispense Refill  . calcium carbonate (TUMS EX) 750 MG chewable tablet Chew 2 tablets by mouth as needed.     . cholecalciferol (VITAMIN D) 1000 UNITS tablet Take 1,000 Units by mouth daily.      [name] esomeprazole (NEXIUM) 40 MG capsule Take 1 capsule (40 mg total) by mouth 2 (two) times daily before a meal. 60 capsule 5  . loratadine (CLARITIN) 10 MG tablet Take 1 tablet (10 mg total) by mouth daily. 30 tablet 11  . Multiple Vitamin (MULTIVITAMIN WITH MINERALS) TABS Take 1 tablet by mouth daily.    . polyethylene glycol (MIRALAX / GLYCOLAX) packet Take 17 g by mouth daily as needed.     . vitamin B-12 (CYANOCOBALAMIN) 1000 MCG tablet Take 1,000 mcg by mouth daily.       No facility-administered medications prior to visit.    ROS Review of Systems  Constitutional: Negative.  Negative for fever, chills, diaphoresis, appetite change and fatigue.  HENT: Negative.  Negative for trouble swallowing and voice change.   Eyes: Negative.   Respiratory: Negative.  Negative for cough, choking, chest tightness, shortness of breath and stridor.   Cardiovascular: Positive for chest pain (intermittent sharp pain over bilateral lower rib cage). Negative for palpitations and leg swelling.  Gastrointestinal: Negative.  Negative for nausea, abdominal pain, diarrhea, constipation, blood in stool and rectal pain.  Endocrine: Negative.   Genitourinary: Negative.   Musculoskeletal: Positive for back pain. Negative for myalgias, joint swelling and neck stiffness.  Skin: Negative.     Allergic/Immunologic: Negative.   Neurological: Negative.  Negative for dizziness, tremors, speech difficulty, weakness, light-headedness, numbness and headaches.  Hematological: Negative for adenopathy. Does not bruise/bleed easily.  Psychiatric/Behavioral: Negative.     Objective:  BP 96/58 mmHg  Pulse 83  Temp(Src) 98.5 F (36.9 C) (Oral)  Ht 5\' 2"  (1.575 m)  Wt 162 lb (73.483 kg)  BMI 29.62 kg/m2  SpO2 97%  BP Readings from Last 3 Encounters:  04/09/15 96/58  12/20/14 128/62  06/19/14 122/60    Wt Readings from Last 3 Encounters:  04/09/15 162 lb (73.483 kg)  12/20/14 161 lb (73.029 kg)  06/19/14 159 lb 12 oz (72.462 kg)    Physical Exam  Constitutional: She is oriented to person, place, and time. No distress.  HENT:  Head: Normocephalic and atraumatic.  Mouth/Throat: Oropharynx is clear and moist. No oropharyngeal exudate.  Eyes: Conjunctivae are normal. Right eye exhibits no discharge. Left eye exhibits no discharge. No scleral icterus.  Neck: Normal range of motion. Neck supple. No JVD present. No tracheal deviation present. No thyromegaly present.  Cardiovascular: Normal rate, regular rhythm, normal heart sounds and intact distal pulses.  Exam reveals no gallop and no friction rub.   No murmur heard. EKG shows normal sinus rhythm with no evidence of ST T wave changes, Q waves or LVH.  Pulmonary/Chest: Effort normal and breath sounds normal. No stridor. No respiratory distress. She has no wheezes. She has no rales. She exhibits no tenderness.  Abdominal: Soft. Bowel sounds are  normal. She exhibits no distension and no mass. There is no tenderness. There is no rebound and no guarding.  Musculoskeletal: Normal range of motion. She exhibits no edema or tenderness.       Cervical back: Normal.       Thoracic back: Normal. She exhibits normal range of motion, no tenderness, no bony tenderness, no swelling, no edema, no deformity, no laceration, no pain, no spasm and  normal pulse.       Lumbar back: Normal.  Lymphadenopathy:    She has no cervical adenopathy.  Neurological: She is oriented to person, place, and time.  Skin: Skin is warm and dry. No rash noted. She is not diaphoretic. No erythema. No pallor.  Psychiatric: She has a normal mood and affect. Her behavior is normal. Judgment and thought content normal.  Vitals reviewed.   Lab Results  Component Value Date   WBC 5.9 12/20/2013   HGB 12.7 12/20/2013   HCT 37.8 12/20/2013   PLT 318.0 12/20/2013   GLUCOSE 108* 12/20/2014   CHOL 235* 12/20/2014   TRIG 101.0 12/20/2014   HDL 56.20 12/20/2014   LDLDIRECT 164.6 12/13/2012   LDLCALC 159* 12/20/2014   ALT 12 12/20/2013   AST 15 12/20/2013   NA 140 12/20/2014   K 4.6 12/20/2014   CL 106 12/20/2014   CREATININE 0.92 12/20/2014   BUN 20 12/20/2014   CO2 27 12/20/2014   TSH 2.52 12/20/2013   HGBA1C 5.9 12/20/2014    Dg Chest 2 View  04/09/2015   CLINICAL DATA:  Mid back pain for 4 days.  EXAM: CHEST  2 VIEW  COMPARISON:  03/04/2010  FINDINGS: Heart is normal size.  Lungs are clear.  No effusions.  Mild compression fracture in a mid thoracic vertebral body, likely T8. This is age indeterminate.  IMPRESSION: No active cardiopulmonary disease.  Mild T8 compression fracture, age indeterminate.   Electronically Signed   By: Rolm Baptise M.D.   On: 04/09/2015 15:50   Dg Thoracic Spine W/swimmers  04/09/2015   CLINICAL DATA:  Four day history of dorsalgia  EXAM: THORACIC SPINE - 3 VIEWS  COMPARISON:  Chest radiograph March 24, 2006  FINDINGS: Frontal, lateral, and swimmer's views obtained. There is anterior wedging of the T8 vertebral body, moderate, age uncertain but not present on prior study from 2007. No other fracture. No spondylolisthesis. There is slight disc space narrowing at several levels in the mid thoracic region. There is postoperative change in the lower cervical spine.  IMPRESSION: Age uncertain anterior wedging of the T8 vertebral  body. No other fracture. No spondylolisthesis. Mild osteoarthritic changes several levels.   Electronically Signed   By: Lowella Grip III M.D.   On: 04/09/2015 15:53    Assessment & Plan:   Areil was seen today for back pain and chest pain.  Diagnoses and all orders for this visit:  Acute chest wall pain- exam and EKG are normal. This appears to be musculoskeletal chest wall pain. -     EKG 12-Lead -     DG Chest 2 View; Future -     HYDROcodone-acetaminophen (NORCO/VICODIN) 5-325 MG per tablet; Take 1 tablet by mouth every 6 (six) hours as needed for moderate pain.  Midline thoracic back pain- she will continue taking Aleve for the pain but I have also ordered asked her to add Norco as well for pain relief. -     DG Thoracic Spine W/Swimmers; Future -     HYDROcodone-acetaminophen (NORCO/VICODIN) 5-325  MG per tablet; Take 1 tablet by mouth every 6 (six) hours as needed for moderate pain.  T8 vertebral fracture, closed, initial encounter- x-ray shows anterior wedging in T8. I will refer her to see if she is a candidate for kyphoplasty. I'm encouraged her to consider having her osteoporosis treated. She will continue Aleve for pain and will also add on Norco for additional pain relief.   I am having Ms. Babler start on HYDROcodone-acetaminophen. I am also having her maintain her polyethylene glycol, calcium carbonate, vitamin B-12, cholecalciferol, multivitamin with minerals, loratadine, and esomeprazole.  Meds ordered this encounter  Medications  . HYDROcodone-acetaminophen (NORCO/VICODIN) 5-325 MG per tablet    Sig: Take 1 tablet by mouth every 6 (six) hours as needed for moderate pain.    Dispense:  35 tablet    Refill:  0     Follow-up: Return in about 3 weeks (around 04/30/2015).  Scarlette Calico, MD

## 2015-04-09 NOTE — Progress Notes (Signed)
Pre visit review using our clinic review tool, if applicable. No additional management support is needed unless otherwise documented below in the visit note. 

## 2015-04-09 NOTE — Telephone Encounter (Signed)
Spoke with pt to inform that she had a fx in her thoracic spine. Dr Ronnald Ramp requested a 3 week follow-up. Dr Asa Lente did not have nay opening, so pt was placed on Dr Ronnald Ramp schedule for follow-up. Pt was instructed to take pain meds as directed and to minimize exercise until follow-up appt. Pt also has appt with Dr Asa Lente in Nov.

## 2015-04-09 NOTE — Patient Instructions (Signed)
Back Pain, Adult Low back pain is very common. About 1 in 5 people have back pain.The cause of low back pain is rarely dangerous. The pain often gets better over time.About half of people with a sudden onset of back pain feel better in just 2 weeks. About 8 in 10 people feel better by 6 weeks.  CAUSES Some common causes of back pain include:  Strain of the muscles or ligaments supporting the spine.  Wear and tear (degeneration) of the spinal discs.  Arthritis.  Direct injury to the back. DIAGNOSIS Most of the time, the direct cause of low back pain is not known.However, back pain can be treated effectively even when the exact cause of the pain is unknown.Answering your caregiver's questions about your overall health and symptoms is one of the most accurate ways to make sure the cause of your pain is not dangerous. If your caregiver needs more information, he or she may order lab work or imaging tests (X-rays or MRIs).However, even if imaging tests show changes in your back, this usually does not require surgery. HOME CARE INSTRUCTIONS For many people, back pain returns.Since low back pain is rarely dangerous, it is often a condition that people can learn to manageon their own.   Remain active. It is stressful on the back to sit or stand in one place. Do not sit, drive, or stand in one place for more than 30 minutes at a time. Take short walks on level surfaces as soon as pain allows.Try to increase the length of time you walk each day.  Do not stay in bed.Resting more than 1 or 2 days can delay your recovery.  Do not avoid exercise or work.Your body is made to move.It is not dangerous to be active, even though your back may hurt.Your back will likely heal faster if you return to being active before your pain is gone.  Pay attention to your body when you bend and lift. Many people have less discomfortwhen lifting if they bend their knees, keep the load close to their bodies,and  avoid twisting. Often, the most comfortable positions are those that put less stress on your recovering back.  Find a comfortable position to sleep. Use a firm mattress and lie on your side with your knees slightly bent. If you lie on your back, put a pillow under your knees.  Only take over-the-counter or prescription medicines as directed by your caregiver. Over-the-counter medicines to reduce pain and inflammation are often the most helpful.Your caregiver may prescribe muscle relaxant drugs.These medicines help dull your pain so you can more quickly return to your normal activities and healthy exercise.  Put ice on the injured area.  Put ice in a plastic bag.  Place a towel between your skin and the bag.  Leave the ice on for 15-20 minutes, 03-04 times a day for the first 2 to 3 days. After that, ice and heat may be alternated to reduce pain and spasms.  Ask your caregiver about trying back exercises and gentle massage. This may be of some benefit.  Avoid feeling anxious or stressed.Stress increases muscle tension and can worsen back pain.It is important to recognize when you are anxious or stressed and learn ways to manage it.Exercise is a great option. SEEK MEDICAL CARE IF:  You have pain that is not relieved with rest or medicine.  You have pain that does not improve in 1 week.  You have new symptoms.  You are generally not feeling well. SEEK   IMMEDIATE MEDICAL CARE IF:   You have pain that radiates from your back into your legs.  You develop new bowel or bladder control problems.  You have unusual weakness or numbness in your arms or legs.  You develop nausea or vomiting.  You develop abdominal pain.  You feel faint. Document Released: 07/14/2005 Document Revised: 01/13/2012 Document Reviewed: 11/15/2013 ExitCare Patient Information 2015 [employer]. This information is not intended to replace advice given to you by your health care provider. Make sure you  discuss any questions you have with your health care provider.  

## 2015-04-12 ENCOUNTER — Telehealth: Payer: Self-pay | Admitting: Internal Medicine

## 2015-04-12 NOTE — Telephone Encounter (Signed)
Notified pt of letter sent in mail. Rescheduled f/u for 04/16/2015

## 2015-04-12 NOTE — Telephone Encounter (Signed)
Pt request clearifcation on the x-ray result that was done. Please give her a call back

## 2015-04-16 ENCOUNTER — Ambulatory Visit (INDEPENDENT_AMBULATORY_CARE_PROVIDER_SITE_OTHER): Payer: Medicare Other | Admitting: Internal Medicine

## 2015-04-16 ENCOUNTER — Encounter: Payer: Self-pay | Admitting: Internal Medicine

## 2015-04-16 VITALS — BP 120/64 | HR 71 | Temp 98.3°F | Resp 16 | Ht 62.0 in | Wt 162.0 lb

## 2015-04-16 DIAGNOSIS — M545 Low back pain: Secondary | ICD-10-CM | POA: Diagnosis not present

## 2015-04-16 DIAGNOSIS — M810 Age-related osteoporosis without current pathological fracture: Secondary | ICD-10-CM

## 2015-04-16 DIAGNOSIS — R0789 Other chest pain: Secondary | ICD-10-CM

## 2015-04-16 DIAGNOSIS — M546 Pain in thoracic spine: Secondary | ICD-10-CM

## 2015-04-16 DIAGNOSIS — S22069A Unspecified fracture of T7-T8 vertebra, initial encounter for closed fracture: Secondary | ICD-10-CM

## 2015-04-16 MED ORDER — HYDROCODONE-ACETAMINOPHEN 5-325 MG PO TABS: 1.0000 | ORAL_TABLET | Freq: Four times a day (QID) | ORAL | Status: DC | PRN

## 2015-04-16 NOTE — Progress Notes (Signed)
Pre visit review using our clinic review tool, if applicable. No additional management support is needed unless otherwise documented below in the visit note. 

## 2015-04-16 NOTE — Patient Instructions (Signed)
Osteoporosis Throughout your life, your body breaks down old bone and replaces it with new bone. As you get older, your body does not replace bone as quickly as it breaks it down. By the age of 30 years, most people begin to gradually lose bone because of the imbalance between bone loss and replacement. Some people lose more bone than others. Bone loss beyond a specified normal degree is considered osteoporosis.  Osteoporosis affects the strength and durability of your bones. The inside of the ends of your bones and your flat bones, like the bones of your pelvis, look like honeycomb, filled with tiny open spaces. As bone loss occurs, your bones become less dense. This means that the open spaces inside your bones become bigger and the walls between these spaces become thinner. This makes your bones weaker. Bones of a person with osteoporosis can become so weak that they can break (fracture) during minor accidents, such as a simple fall. CAUSES  The following factors have been associated with the development of osteoporosis:  Smoking.  Drinking more than 2 alcoholic drinks several days per week.  Long-term use of certain medicines:  Corticosteroids.  Chemotherapy medicines.  Thyroid medicines.  Antiepileptic medicines.  Gonadal hormone suppression medicine.  Immunosuppression medicine.  Being underweight.  Lack of physical activity.  Lack of exposure to the sun. This can lead to vitamin D deficiency.  Certain medical conditions:  Certain inflammatory bowel diseases, such as Crohn disease and ulcerative colitis.  Diabetes.  Hyperthyroidism.  Hyperparathyroidism. RISK FACTORS Anyone can develop osteoporosis. However, the following factors can increase your risk of developing osteoporosis:  Gender--Women are at higher risk than men.  Age--Being older than 50 years increases your risk.  Ethnicity--White and Asian people have an increased risk.  Weight --Being extremely  underweight can increase your risk of osteoporosis.  Family history of osteoporosis--Having a family member who has developed osteoporosis can increase your risk. SYMPTOMS  Usually, people with osteoporosis have no symptoms.  DIAGNOSIS  Signs during a physical exam that may prompt your caregiver to suspect osteoporosis include:  Decreased height. This is usually caused by the compression of the bones that form your spine (vertebrae) because they have weakened and become fractured.  A curving or rounding of the upper back (kyphosis). To confirm signs of osteoporosis, your caregiver may request a procedure that uses 2 low-dose X-ray beams with different levels of energy to measure your bone mineral density (dual-energy X-ray absorptiometry [DXA]). Also, your caregiver may check your level of vitamin D. TREATMENT  The goal of osteoporosis treatment is to strengthen bones in order to decrease the risk of bone fractures. There are different types of medicines available to help achieve this goal. Some of these medicines work by slowing the processes of bone loss. Some medicines work by increasing bone density. Treatment also involves making sure that your levels of calcium and vitamin D are adequate. PREVENTION  There are things you can do to help prevent osteoporosis. Adequate intake of calcium and vitamin D can help you achieve optimal bone mineral density. Regular exercise can also help, especially resistance and weight-bearing activities. If you smoke, quitting smoking is an important part of osteoporosis prevention. MAKE SURE YOU:  Understand these instructions.  Will watch your condition.  Will get help right away if you are not doing well or get worse. FOR MORE INFORMATION www.osteo.org and www.nof.org Document Released: 04/23/2005 Document Revised: 11/08/2012 Document Reviewed: 06/28/2011 Exit[employer] Patient Information 2015 [employer]. This information is not   intended to replace advice  given to you by your health care provider. Make sure you discuss any questions you have with your health care provider.  

## 2015-04-16 NOTE — Progress Notes (Signed)
Subjective:  Patient ID: [name], female    DOB: 1935-03-24  Age: 80 y.o. MRN: [id number]  CC: Back Pain   HPI Gregory S Rushlow presents for follow-up on thoracic back pain and a recent diagnosis of T8 vertebral fracture. She is still experiencing some pain. She is seeing an orthopedist surgeon this afternoon about considering kyphoplasty. She is also willing to treat her osteoporosis. The pain has not changed in its location or description. She is taking Norco and it provides her about 6 or 8 hours of pain relief.   Outpatient Prescriptions Prior to Visit  Medication Sig Dispense Refill  . calcium carbonate (TUMS EX) 750 MG chewable tablet Chew 2 tablets by mouth as needed.     . cholecalciferol (VITAMIN D) 1000 UNITS tablet Take 1,000 Units by mouth daily.      [name] esomeprazole (NEXIUM) 40 MG capsule Take 1 capsule (40 mg total) by mouth 2 (two) times daily before a meal. 60 capsule 5  . loratadine (CLARITIN) 10 MG tablet Take 1 tablet (10 mg total) by mouth daily. 30 tablet 11  . Multiple Vitamin (MULTIVITAMIN WITH MINERALS) TABS Take 1 tablet by mouth daily.    . polyethylene glycol (MIRALAX / GLYCOLAX) packet Take 17 g by mouth daily as needed.     . vitamin B-12 (CYANOCOBALAMIN) 1000 MCG tablet Take 1,000 mcg by mouth daily.      [name] HYDROcodone-acetaminophen (NORCO/VICODIN) 5-325 MG per tablet Take 1 tablet by mouth every 6 (six) hours as needed for moderate pain. 35 tablet 0   No facility-administered medications prior to visit.    ROS Review of Systems  Constitutional: Negative.  Negative for fever, chills, diaphoresis, appetite change and fatigue.  HENT: Negative.   Eyes: Negative.   Respiratory: Negative.  Negative for cough, choking, chest tightness, shortness of breath and stridor.   Cardiovascular: Negative.  Negative for chest pain, palpitations and leg swelling.  Gastrointestinal: Negative.  Negative for nausea, vomiting, abdominal pain, diarrhea, constipation  and blood in stool.  Endocrine: Negative.   Genitourinary: Negative.   Musculoskeletal: Positive for back pain. Negative for myalgias, joint swelling, arthralgias, gait problem and neck pain.  Skin: Negative.  Negative for rash.  Allergic/Immunologic: Negative.   Neurological: Negative.  Negative for dizziness, syncope, speech difficulty, light-headedness, numbness and headaches.  Hematological: Negative.  Negative for adenopathy. Does not bruise/bleed easily.  Psychiatric/Behavioral: Negative.     Objective:  BP 120/64 mmHg  Pulse 71  Temp(Src) 98.3 F (36.8 C) (Oral)  Resp 16  Ht 5\' 2"  (1.575 m)  Wt 162 lb (73.483 kg)  BMI 29.62 kg/m2  SpO2 94%  BP Readings from Last 3 Encounters:  04/16/15 120/64  04/09/15 110/58  12/20/14 128/62    Wt Readings from Last 3 Encounters:  04/16/15 162 lb (73.483 kg)  04/09/15 162 lb (73.483 kg)  12/20/14 161 lb (73.029 kg)    Physical Exam  Constitutional: She is oriented to person, place, and time. No distress.  HENT:  Mouth/Throat: Oropharynx is clear and moist. No oropharyngeal exudate.  Eyes: Conjunctivae are normal. Right eye exhibits no discharge. Left eye exhibits no discharge. No scleral icterus.  Neck: Normal range of motion. Neck supple. No JVD present. No tracheal deviation present. No thyromegaly present.  Cardiovascular: Normal rate, regular rhythm, normal heart sounds and intact distal pulses.  Exam reveals no gallop and no friction rub.   No murmur heard. Pulmonary/Chest: Effort normal and breath sounds normal. No stridor. No respiratory  distress. She has no wheezes. She has no rales. She exhibits no tenderness.  Abdominal: Soft. Bowel sounds are normal. She exhibits no distension and no mass. There is no tenderness. There is no rebound and no guarding.  Musculoskeletal: Normal range of motion. She exhibits no edema or tenderness.       Thoracic back: Normal. She exhibits normal range of motion, no tenderness, no bony  tenderness, no swelling, no edema, no deformity, no laceration, no pain, no spasm and normal pulse.  Lymphadenopathy:    She has no cervical adenopathy.  Neurological: She is oriented to person, place, and time.  Skin: Skin is warm and dry. No rash noted. She is not diaphoretic. No erythema. No pallor.  Psychiatric: She has a normal mood and affect. Her behavior is normal. Judgment and thought content normal.  Vitals reviewed.   Lab Results  Component Value Date   WBC 5.9 12/20/2013   HGB 12.7 12/20/2013   HCT 37.8 12/20/2013   PLT 318.0 12/20/2013   GLUCOSE 108* 12/20/2014   CHOL 235* 12/20/2014   TRIG 101.0 12/20/2014   HDL 56.20 12/20/2014   LDLDIRECT 164.6 12/13/2012   LDLCALC 159* 12/20/2014   ALT 12 12/20/2013   AST 15 12/20/2013   NA 140 12/20/2014   K 4.6 12/20/2014   CL 106 12/20/2014   CREATININE 0.92 12/20/2014   BUN 20 12/20/2014   CO2 27 12/20/2014   TSH 2.52 12/20/2013   HGBA1C 5.9 12/20/2014    Dg Chest 2 View  04/09/2015   CLINICAL DATA:  Mid back pain for 4 days.  EXAM: CHEST  2 VIEW  COMPARISON:  03/04/2010  FINDINGS: Heart is normal size.  Lungs are clear.  No effusions.  Mild compression fracture in a mid thoracic vertebral body, likely T8. This is age indeterminate.  IMPRESSION: No active cardiopulmonary disease.  Mild T8 compression fracture, age indeterminate.   Electronically Signed   By: Rolm Baptise M.D.   On: 04/09/2015 15:50   Dg Thoracic Spine W/swimmers  04/09/2015   CLINICAL DATA:  Four day history of dorsalgia  EXAM: THORACIC SPINE - 3 VIEWS  COMPARISON:  Chest radiograph March 24, 2006  FINDINGS: Frontal, lateral, and swimmer's views obtained. There is anterior wedging of the T8 vertebral body, moderate, age uncertain but not present on prior study from 2007. No other fracture. No spondylolisthesis. There is slight disc space narrowing at several levels in the mid thoracic region. There is postoperative change in the lower cervical spine.   IMPRESSION: Age uncertain anterior wedging of the T8 vertebral body. No other fracture. No spondylolisthesis. Mild osteoarthritic changes several levels.   Electronically Signed   By: Lowella Grip III M.D.   On: 04/09/2015 15:53    Assessment & Plan:   Jaydee was seen today for back pain.  Diagnoses and all orders for this visit:  Osteoporosis -     Ambulatory referral to Endocrinology  T8 vertebral fracture, closed, initial encounter -     HYDROcodone-acetaminophen (NORCO/VICODIN) 5-325 MG per tablet; Take 1 tablet by mouth every 6 (six) hours as needed for moderate pain.  Acute chest wall pain  Midline thoracic back pain   I am having Ms. Vangorder maintain her polyethylene glycol, calcium carbonate, vitamin B-12, cholecalciferol, multivitamin with minerals, loratadine, esomeprazole, and HYDROcodone-acetaminophen.  Meds ordered this encounter  Medications  . HYDROcodone-acetaminophen (NORCO/VICODIN) 5-325 MG per tablet    Sig: Take 1 tablet by mouth every 6 (six) hours as needed for moderate  pain.    Dispense:  75 tablet    Refill:  0     Follow-up: Return in about 3 months (around 07/16/2015).  Scarlette Calico, MD

## 2015-04-19 ENCOUNTER — Other Ambulatory Visit: Payer: Self-pay | Admitting: Sports Medicine

## 2015-04-19 DIAGNOSIS — M545 Low back pain: Secondary | ICD-10-CM | POA: Diagnosis not present

## 2015-04-23 ENCOUNTER — Ambulatory Visit
Admission: RE | Admit: 2015-04-23 | Discharge: 2015-04-23 | Disposition: A | Discharge status: Home / Self Care | Payer: Medicare Other | Source: Ambulatory Visit | Attending: Sports Medicine | Admitting: Sports Medicine

## 2015-04-23 DIAGNOSIS — M545 Low back pain: Secondary | ICD-10-CM

## 2015-04-23 DIAGNOSIS — M8448XA Pathological fracture, other site, initial encounter for fracture: Secondary | ICD-10-CM | POA: Diagnosis not present

## 2015-04-24 ENCOUNTER — Ambulatory Visit: Payer: Medicare Other | Admitting: Internal Medicine

## 2015-04-25 DIAGNOSIS — M545 Low back pain: Secondary | ICD-10-CM | POA: Diagnosis not present

## 2015-05-07 ENCOUNTER — Other Ambulatory Visit: Payer: Medicare Other

## 2015-05-22 DIAGNOSIS — D225 Melanocytic nevi of trunk: Secondary | ICD-10-CM | POA: Diagnosis not present

## 2015-05-22 DIAGNOSIS — L820 Inflamed seborrheic keratosis: Secondary | ICD-10-CM | POA: Diagnosis not present

## 2015-05-22 DIAGNOSIS — D3617 Benign neoplasm of peripheral nerves and autonomic nervous system of trunk, unspecified: Secondary | ICD-10-CM | POA: Diagnosis not present

## 2015-05-22 DIAGNOSIS — D235 Other benign neoplasm of skin of trunk: Secondary | ICD-10-CM | POA: Diagnosis not present

## 2015-05-22 DIAGNOSIS — C4441 Basal cell carcinoma of skin of scalp and neck: Secondary | ICD-10-CM | POA: Diagnosis not present

## 2015-06-11 DIAGNOSIS — M545 Low back pain: Secondary | ICD-10-CM | POA: Diagnosis not present

## 2015-06-11 DIAGNOSIS — M546 Pain in thoracic spine: Secondary | ICD-10-CM | POA: Diagnosis not present

## 2015-06-26 ENCOUNTER — Ambulatory Visit (INDEPENDENT_AMBULATORY_CARE_PROVIDER_SITE_OTHER): Payer: Medicare Other | Admitting: Internal Medicine

## 2015-06-26 ENCOUNTER — Encounter: Payer: Self-pay | Admitting: Internal Medicine

## 2015-06-26 VITALS — BP 106/60 | HR 67 | Temp 98.1°F | Ht 62.0 in | Wt 164.5 lb

## 2015-06-26 DIAGNOSIS — Z08 Encounter for follow-up examination after completed treatment for malignant neoplasm: Secondary | ICD-10-CM | POA: Diagnosis not present

## 2015-06-26 DIAGNOSIS — M810 Age-related osteoporosis without current pathological fracture: Secondary | ICD-10-CM

## 2015-06-26 DIAGNOSIS — S22060S Wedge compression fracture of T7-T8 vertebra, sequela: Secondary | ICD-10-CM | POA: Diagnosis not present

## 2015-06-26 DIAGNOSIS — Z23 Encounter for immunization: Secondary | ICD-10-CM | POA: Diagnosis not present

## 2015-06-26 DIAGNOSIS — Z85828 Personal history of other malignant neoplasm of skin: Secondary | ICD-10-CM | POA: Diagnosis not present

## 2015-06-26 MED ORDER — CALCITONIN (SALMON) 200 UNIT/ACT NA SOLN: 1.0000 | Freq: Every day | NASAL | Status: DC

## 2015-06-26 NOTE — Patient Instructions (Addendum)
It was good to see you today.  We have reviewed your prior records including labs and tests today  Annual flu shot updated today  Medications reviewed and updated, no changes recommended at this time. Continue daily calcitonin nose spray for your bones as discussed. Refill on this medication(s) as discussed today.  At this time there is no need for follow-up with endocrinology, neurosurgery or any other specialist. Continue working with Dr. Alfonso Ramus as needed as reviewed  Please schedule followup in 6 months with new primary care doctor Dr. Quay Burow, please call sooner if problems.  Osteoporosis Osteoporosis is the thinning and loss of density in the bones. Osteoporosis makes the bones more brittle, fragile, and likely to break (fracture). Over time, osteoporosis can cause the bones to become so weak that they fracture after a simple fall. The bones most likely to fracture are the bones in the hip, wrist, and spine. CAUSES  The exact cause is not known. RISK FACTORS Anyone can develop osteoporosis. You may be at greater risk if you have a family history of the condition or have poor nutrition. You may also have a higher risk if you are:   Female.   36 years old or older.  A smoker.  Not physically active.   White or Asian.  Slender. SIGNS AND SYMPTOMS  A fracture might be the first sign of the disease, especially if it results from a fall or injury that would not usually cause a bone to break. Other signs and symptoms include:   Low back and neck pain.  Stooped posture.  Height loss. DIAGNOSIS  To make a diagnosis, your health care provider may:  Take a medical history.  Perform a physical exam.  Order tests, such as:  A bone mineral density test.  A dual-energy X-ray absorptiometry test. TREATMENT  The goal of osteoporosis treatment is to strengthen your bones to reduce your risk of a fracture. Treatment may involve:  Making lifestyle changes, such as:  Eating a  diet rich in calcium.  Doing weight-bearing and muscle-strengthening exercises.  Stopping tobacco use.  Limiting alcohol intake.  Taking medicine to slow the process of bone loss or to increase bone density.  Monitoring your levels of calcium and vitamin D. HOME CARE INSTRUCTIONS  Include calcium and vitamin D in your diet. Calcium is important for bone health, and vitamin D helps the body absorb calcium.  Perform weight-bearing and muscle-strengthening exercises as directed by your health care provider.  Do not use any tobacco products, including cigarettes, chewing tobacco, and electronic cigarettes. If you need help quitting, ask your health care provider.  Limit your alcohol intake.  Take medicines only as directed by your health care provider.  Keep all follow-up visits as directed by your health care provider. This is important.  Take precautions at home to lower your risk of falling, such as:  Keeping rooms well lit and clutter free.  Installing safety rails on stairs.  Using rubber mats in the bathroom and other areas that are often wet or slippery. SEEK IMMEDIATE MEDICAL CARE IF:  You fall or injure yourself.    This information is not intended to replace advice given to you by your health care provider. Make sure you discuss any questions you have with your health care provider.   Document Released: 04/23/2005 Document Revised: 08/04/2014 Document Reviewed: 12/22/2013 Elsevier Interactive Patient Education [employer].

## 2015-06-26 NOTE — Assessment & Plan Note (Signed)
Acute compression of T8 vertebral, confirmed on MRI 04/23/15 - following with sports med ortho Alfonso Ramus) for same Has had known severe disease since at least and repeatedly declined any therapy due to feared side effects (swallow issues with bisphos and declines Prolia at recommendation of dentist due to periodontal dz) Now taking calcitonin nasal spray and OTC Ca/Vit D as recommended by Alfonso Ramus, declined KP or endo eval for same Continue WB exercise

## 2015-06-26 NOTE — Progress Notes (Signed)
Subjective:    Patient ID: [name], female    DOB: Jun 04, 1935, 80 y.o.   MRN: [id number]  HPI  Patient here for follow up Reviewed chronic medical conditions, interval events and current concerns  Past Medical History  Diagnosis Date  . Osteoporosis     T8 compression fx 03/2015   . Hyperlipidemia   . Vitamin B12 deficiency   . breast ca 1978  . GERD (gastroesophageal reflux disease)   . IBS (irritable bowel syndrome)     Review of Systems  Constitutional: Positive for unexpected weight change (gain). Negative for fever and fatigue.  Musculoskeletal: Positive for back pain (R scapula since T8 fx 03/2015 - currently much improved) and arthralgias (knee, no swelling - chronic pain with ambulation efforts). Negative for joint swelling.  Neurological: Negative for dizziness, weakness and headaches.       Objective:    Physical Exam  Constitutional: She appears well-developed and well-nourished. No distress.  obese  Cardiovascular: Normal rate, regular rhythm and normal heart sounds.   No murmur heard. Pulmonary/Chest: Effort normal and breath sounds normal. No respiratory distress.  Musculoskeletal: She exhibits no edema.  Nontender to palpation of right scapula and thoracic spine. Full range of motion of right shoulder and neck  Vitals reviewed.   BP 106/60 mmHg  Pulse 67  Temp(Src) 98.1 F (36.7 C) (Oral)  Ht 5\' 2"  (1.575 m)  Wt 164 lb 8 oz (74.617 kg)  BMI 30.08 kg/m2  SpO2 97% Wt Readings from Last 3 Encounters:  06/26/15 164 lb 8 oz (74.617 kg)  04/16/15 162 lb (73.483 kg)  04/09/15 162 lb (73.483 kg)    Lab Results  Component Value Date   WBC 5.9 12/20/2013   HGB 12.7 12/20/2013   HCT 37.8 12/20/2013   PLT 318.0 12/20/2013   GLUCOSE 108* 12/20/2014   CHOL 235* 12/20/2014   TRIG 101.0 12/20/2014   HDL 56.20 12/20/2014   LDLDIRECT 164.6 12/13/2012   LDLCALC 159* 12/20/2014   ALT 12 12/20/2013   AST 15 12/20/2013   NA 140 12/20/2014   K  4.6 12/20/2014   CL 106 12/20/2014   CREATININE 0.92 12/20/2014   BUN 20 12/20/2014   CO2 27 12/20/2014   TSH 2.52 12/20/2013   HGBA1C 5.9 12/20/2014    Mr Thoracic Spine Wo Contrast  04/23/2015  CLINICAL DATA:  Pain after exercising.  Worse after standing. EXAM: MRI THORACIC SPINE WITHOUT CONTRAST TECHNIQUE: Multiplanar, multisequence MR imaging of the thoracic spine was performed. No intravenous contrast was administered. COMPARISON:  None. FINDINGS: There is a T8 vertebral body compression fracture with approximately 60% height loss and marrow edema throughout the vertebral body. There is 3 mm of retropulsion of the inferior posterior margin of the T8 vertebral body without spinal stenosis. Relative kyphosis of the thoracic spine centered at T8. The alignment is anatomic. There is no static listhesis. The marrow signal is otherwise normal. The thoracic spinal cord is normal in size and signal. The disc spaces are maintained. T1-T2: No significant disc protrusion, foraminal stenosis or central canal stenosis. T2-T3: No significant disc protrusion, foraminal stenosis or central canal stenosis. T3-T4: No significant disc protrusion, foraminal stenosis or central canal stenosis. T4-T5: No significant disc protrusion, foraminal stenosis or central canal stenosis. T5-T6: No significant disc protrusion, foraminal stenosis or central canal stenosis. T6-T7: No significant disc protrusion, foraminal stenosis or central canal stenosis. T7-T8: No significant disc protrusion, foraminal stenosis or central canal stenosis. T8-T9: No significant disc protrusion,  foraminal stenosis or central canal stenosis. T9-T10: No significant disc protrusion, foraminal stenosis or central canal stenosis. T10-T11: No significant disc protrusion, foraminal stenosis or central canal stenosis. T11-T12: No significant disc protrusion, foraminal stenosis or central canal stenosis. IMPRESSION: 1. Acute T8 vertebral body compression  fracture with approximately 60% height loss. Electronically Signed   By: Kathreen Devoid   On: 04/23/2015 10:08       Assessment & Plan:   Problem List Items Addressed This Visit    Osteoporosis - Primary    Hx reviewed - on calcitonin nasal spray since 04/2015 following T8 compression fx Previously declined specific treatment due to contraindication or side effect (Prior Fosamax not tolerated because of GI upset and dysphagia;  advised not to take Prolia by dentist due to risk of periodontal dz)  Continued vitamin D with calcium as tolerated and weightbearing exercise -  Last DEXA 05/2014 reviewed -  No falls or new bone pain since 03/2015 T8 compression fx event (which has improved)         Relevant Medications   calcitonin, salmon, (MIACALCIN/FORTICAL) 200 UNIT/ACT nasal spray   T8 vertebral fracture (H[facility])    Acute compression of T8 vertebral, confirmed on MRI 04/23/15 - following with sports med ortho Alfonso Ramus) for same Has had known severe disease since at least and repeatedly declined any therapy due to feared side effects (swallow issues with bisphos and declines Prolia at recommendation of dentist due to periodontal dz) Now taking calcitonin nasal spray and OTC Ca/Vit D as recommended by Alfonso Ramus, declined KP or endo eval for same Continue WB exercise         Other Visit Diagnoses    Need for prophylactic vaccination and inoculation against influenza        Relevant Orders    Flu vaccine HIGH DOSE PF (Fluzone High dose) (Completed)      Time spent with pt/family today 25 minutes, greater than 50% time spent counseling patient on osteoporosis and compression fracture management and medication review. Also review of prior records   Gwendolyn Grant, MD

## 2015-06-26 NOTE — Assessment & Plan Note (Signed)
Hx reviewed - on calcitonin nasal spray since 04/2015 following T8 compression fx Previously declined specific treatment due to contraindication or side effect (Prior Fosamax not tolerated because of GI upset and dysphagia;  advised not to take Prolia by dentist due to risk of periodontal dz)  Continued vitamin D with calcium as tolerated and weightbearing exercise -  Last DEXA 05/2014 reviewed -  No falls or new bone pain since 03/2015 T8 compression fx event (which has improved)

## 2015-06-26 NOTE — Progress Notes (Signed)
Pre visit review using our clinic review tool, if applicable. No additional management support is needed unless otherwise documented below in the visit note. 

## 2015-07-05 DIAGNOSIS — H0015 Chalazion left lower eyelid: Secondary | ICD-10-CM | POA: Diagnosis not present

## 2015-07-09 ENCOUNTER — Telehealth: Payer: Self-pay | Admitting: *Deleted

## 2015-07-09 MED ORDER — ESOMEPRAZOLE MAGNESIUM 40 MG PO CPDR: 40.0000 mg | DELAYED_RELEASE_CAPSULE | Freq: Two times a day (BID) | ORAL | Status: DC

## 2015-07-09 NOTE — Telephone Encounter (Signed)
Received call pt is needing refill on her Nexium. Verified pharmacy inform will send to [facility]...Johny Chess

## 2015-09-03 DIAGNOSIS — R5383 Other fatigue: Secondary | ICD-10-CM | POA: Diagnosis not present

## 2015-09-03 DIAGNOSIS — M810 Age-related osteoporosis without current pathological fracture: Secondary | ICD-10-CM | POA: Diagnosis not present

## 2015-09-03 DIAGNOSIS — T148 Other injury of unspecified body region: Secondary | ICD-10-CM | POA: Diagnosis not present

## 2015-09-03 DIAGNOSIS — R7301 Impaired fasting glucose: Secondary | ICD-10-CM | POA: Diagnosis not present

## 2015-09-14 DIAGNOSIS — R7301 Impaired fasting glucose: Secondary | ICD-10-CM | POA: Diagnosis not present

## 2015-09-14 DIAGNOSIS — E538 Deficiency of other specified B group vitamins: Secondary | ICD-10-CM | POA: Diagnosis not present

## 2015-09-14 DIAGNOSIS — E559 Vitamin D deficiency, unspecified: Secondary | ICD-10-CM | POA: Diagnosis not present

## 2015-09-14 DIAGNOSIS — R5383 Other fatigue: Secondary | ICD-10-CM | POA: Diagnosis not present

## 2015-10-26 DIAGNOSIS — M549 Dorsalgia, unspecified: Secondary | ICD-10-CM | POA: Diagnosis not present

## 2015-10-26 DIAGNOSIS — M5136 Other intervertebral disc degeneration, lumbar region: Secondary | ICD-10-CM | POA: Diagnosis not present

## 2015-10-26 DIAGNOSIS — G629 Polyneuropathy, unspecified: Secondary | ICD-10-CM | POA: Diagnosis not present

## 2015-10-26 DIAGNOSIS — F329 Major depressive disorder, single episode, unspecified: Secondary | ICD-10-CM | POA: Diagnosis not present

## 2015-11-13 ENCOUNTER — Encounter: Payer: Self-pay | Admitting: Internal Medicine

## 2015-11-13 ENCOUNTER — Ambulatory Visit (INDEPENDENT_AMBULATORY_CARE_PROVIDER_SITE_OTHER): Payer: Medicare Other | Admitting: Internal Medicine

## 2015-11-13 VITALS — BP 154/70 | HR 67 | Temp 98.3°F | Resp 16 | Wt 163.0 lb

## 2015-11-13 DIAGNOSIS — F419 Anxiety disorder, unspecified: Secondary | ICD-10-CM | POA: Diagnosis not present

## 2015-11-13 DIAGNOSIS — M810 Age-related osteoporosis without current pathological fracture: Secondary | ICD-10-CM

## 2015-11-13 DIAGNOSIS — K219 Gastro-esophageal reflux disease without esophagitis: Secondary | ICD-10-CM | POA: Diagnosis not present

## 2015-11-13 MED ORDER — DULOXETINE HCL 20 MG PO CPEP: 20.0000 mg | ORAL_CAPSULE | Freq: Every day | ORAL | Status: DC

## 2015-11-13 NOTE — Patient Instructions (Addendum)
Start taking the nexium daily.  Start taking calcium citrate, not calcium carbonate.    Ask the dentist about taking prolia.   Continue the cymbalta - if you have any side effects and want to stop it, you can take it every other day for one week and then stop it.    Gastroesophageal Reflux Disease, Adult Normally, food travels down the esophagus and stays in the stomach to be digested. However, when a person has gastroesophageal reflux disease (GERD), food and stomach acid move back up into the esophagus. When this happens, the esophagus becomes sore and inflamed. Over time, GERD can create small holes (ulcers) in the lining of the esophagus.  CAUSES This condition is caused by a problem with the muscle between the esophagus and the stomach (lower esophageal sphincter, or LES). Normally, the LES muscle closes after food passes through the esophagus to the stomach. When the LES is weakened or abnormal, it does not close properly, and that allows food and stomach acid to go back up into the esophagus. The LES can be weakened by certain dietary substances, medicines, and medical conditions, including:  Tobacco use.  Pregnancy.  Having a hiatal hernia.  Heavy alcohol use.  Certain foods and beverages, such as coffee, chocolate, onions, and peppermint. RISK FACTORS This condition is more likely to develop in:  People who have an increased body weight.  People who have connective tissue disorders.  People who use NSAID medicines. SYMPTOMS Symptoms of this condition include:  Heartburn.  Difficult or painful swallowing.  The feeling of having a lump in the throat.  Abitter taste in the mouth.  Bad breath.  Having a large amount of saliva.  Having an upset or bloated stomach.  Belching.  Chest pain.  Shortness of breath or wheezing.  Ongoing (chronic) cough or a night-time cough.  Wearing away of tooth enamel.  Weight loss. Different conditions can cause chest pain.  Make sure to see your health care provider if you experience chest pain. DIAGNOSIS Your health care provider will take a medical history and perform a physical exam. To determine if you have mild or severe GERD, your health care provider may also monitor how you respond to treatment. You may also have other tests, including:  An endoscopy toexamine your stomach and esophagus with a small camera.  A test thatmeasures the acidity level in your esophagus.  A test thatmeasures how much pressure is on your esophagus.  A barium swallow or modified barium swallow to show the shape, size, and functioning of your esophagus. TREATMENT The goal of treatment is to help relieve your symptoms and to prevent complications. Treatment for this condition may vary depending on how severe your symptoms are. Your health care provider may recommend:  Changes to your diet.  Medicine.  Surgery. HOME CARE INSTRUCTIONS Diet  Follow a diet as recommended by your health care provider. This may involve avoiding foods and drinks such as:  Coffee and tea (with or without caffeine).  Drinks that containalcohol.  Energy drinks and sports drinks.  Carbonated drinks or sodas.  Chocolate and cocoa.  Peppermint and mint flavorings.  Garlic and onions.  Horseradish.  Spicy and acidic foods, including peppers, chili powder, curry powder, vinegar, hot sauces, and barbecue sauce.  Citrus fruit juices and citrus fruits, such as oranges, lemons, and limes.  Tomato-based foods, such as red sauce, chili, salsa, and pizza with red sauce.  Fried and fatty foods, such as donuts, french fries, potato chips, and  high-fat dressings.  High-fat meats, such as hot dogs and fatty cuts of red and white meats, such as rib eye steak, sausage, ham, and bacon.  High-fat dairy items, such as whole milk, butter, and cream cheese.  Eat small, frequent meals instead of large meals.  Avoid drinking large amounts of liquid  with your meals.  Avoid eating meals during the 2-3 hours before bedtime.  Avoid lying down right after you eat.  Do not exercise right after you eat. General Instructions  Pay attention to any changes in your symptoms.  Take over-the-counter and prescription medicines only as told by your health care provider. Do not take aspirin, ibuprofen, or other NSAIDs unless your health care provider told you to do so.  Do not use any tobacco products, including cigarettes, chewing tobacco, and e-cigarettes. If you need help quitting, ask your health care provider.  Wear loose-fitting clothing. Do not wear anything tight around your waist that causes pressure on your abdomen.  Raise (elevate) the head of your bed 6 inches (15cm).  Try to reduce your stress, such as with yoga or meditation. If you need help reducing stress, ask your health care provider.  If you are overweight, reduce your weight to an amount that is healthy for you. Ask your health care provider for guidance about a safe weight loss goal.  Keep all follow-up visits as told by your health care provider. This is important. SEEK MEDICAL CARE IF:  You have new symptoms.  You have unexplained weight loss.  You have difficulty swallowing, or it hurts to swallow.  You have wheezing or a persistent cough.  Your symptoms do not improve with treatment.  You have a hoarse voice. SEEK IMMEDIATE MEDICAL CARE IF:  You have pain in your arms, neck, jaw, teeth, or back.  You feel sweaty, dizzy, or light-headed.  You have chest pain or shortness of breath.  You vomit and your vomit looks like blood or coffee grounds.  You faint.  Your stool is bloody or black.  You cannot swallow, drink, or eat.   This information is not intended to replace advice given to you by your health care provider. Make sure you discuss any questions you have with your health care provider.   Document Released: 04/23/2005 Document Revised:  04/04/2015 Document Reviewed: 11/08/2014 Elsevier Interactive Patient Education [employer].

## 2015-11-13 NOTE — Progress Notes (Signed)
Pre visit review using our clinic review tool, if applicable. No additional management support is needed unless otherwise documented below in the visit note. 

## 2015-11-13 NOTE — Assessment & Plan Note (Signed)
Not controlled - needs daily PPI Benefits outweigh risks at this time Strict GERD diet - info given Start nexium daily F/u next month

## 2015-11-13 NOTE — Assessment & Plan Note (Signed)
Will discuss at her next visit T8 compression fracture 03/2015 On calcitonin nasal spray since 04/2015  Has been on fosamax - had GI SE Would benefit from prolia - she will discuss with her dentist Vitamin d level low normal in [place] - increase to 2000 units daily Switch calcium to calcium citrate Exercise regularly dexa to be ordered at next visit

## 2015-11-13 NOTE — Assessment & Plan Note (Signed)
cymbalta started in [place] due to anxiety - medication has been helpful, but has had some "electric shocks" - ? Side effect Will continue medication at 20 mg daily for now - if she has the electric shocks she will discontinue - take every other day for one week then stop We will re-evaluate next month She will with any questions or concerns.

## 2015-11-13 NOTE — Progress Notes (Signed)
Subjective:    Patient ID: [name], female    DOB: 01-14-1935, 81 y.o.   MRN: [id number]  HPI She is here for an acute visit for follow up of her anxiety.  Anxiety:  Before going to [facility] she started having increased anxiety.  She saw her doctor in [place] because her anxiety continued to get worse.  She was having waves of panic.  It would come and go.  Her legs would get tired and she would get nervous and have a wave of painic and she would not be able to move.  It does not last long and passes after several seconds.  She would have to just stop during that time until the "wave" passed.  She wonders if the legs symptoms are from her neck or back.  She would also have palpitations or a lump in her throat.  Her doctor in [place] started her cymbalta and that has helped.  She has had a couple of instances of feeling like an electric shock go through her body and she wondered if that was a side effect from the medication-  It started after starting the medication, she thinks.   She was not sure if she should continue it or not.   She has a compression fracture in her back and has had pain and neck pain.  She has chronic neck pain and has had surgery in the past.   She was wondering if her leg pain and tiredness was from her neck or back.  She will be seeing her neurologist sometime soon.   OP:  She is taking the nasal spray prescribed by orthopedics.   She was on fosamax in the past.  She was told by her dentist not to go on prolia, but she does not recall.  She does see him next month and can ask him.   Gerd:  She is having daily symptoms.  She is taking the medication daily  As needed only due to possible side effects.  She is taking tums frequently - 2000 mg daily  Medications and allergies reviewed with patient and updated if appropriate.  Patient Active Problem List   Diagnosis Date Noted  . T8 vertebral fracture ([place]) 04/09/2015  . Hyperglycemia 06/19/2014  . Overweight  10/09/2011  . Allergic rhinitis, cause unspecified   . GERD (gastroesophageal reflux disease) 02/05/2011  . EPISTAXIS 07/23/2010  . FATIGUE 09/28/2009  . VITAMIN B12 DEFICIENCY 04/17/2009  . Hyperlipidemia 04/17/2009  . Osteoporosis 04/17/2009  . ADENOCARCINOMA, BREAST, HX OF 04/17/2009  . CONSTIPATION 03/08/2009  . COLONIC POLYPS, HYPERPLASTIC, HX OF 03/06/2009    Current Outpatient Prescriptions on File Prior to Visit  Medication Sig Dispense Refill  . aspirin 81 MG tablet Take 81 mg by mouth daily.    . calcitonin, salmon, (MIACALCIN/FORTICAL) 200 UNIT/ACT nasal spray Place 1 spray into alternate nostrils daily. 3.7 mL 5  . calcium carbonate (TUMS EX) 750 MG chewable tablet Chew 2 tablets by mouth as needed.     . cholecalciferol (VITAMIN D) 1000 UNITS tablet Take 1,000 Units by mouth daily.      [name] esomeprazole (NEXIUM) 40 MG capsule Take 1 capsule (40 mg total) by mouth 2 (two) times daily before a meal. 60 capsule 5  . loratadine (CLARITIN) 10 MG tablet Take 1 tablet (10 mg total) by mouth daily. 30 tablet 11  . Omega-3 Fatty Acids (FISH OIL PO) Take by mouth.    . polyethylene glycol (MIRALAX / GLYCOLAX)  packet Take 17 g by mouth daily as needed.     . vitamin B-12 (CYANOCOBALAMIN) 1000 MCG tablet Take 1,000 mcg by mouth daily.       No current facility-administered medications on file prior to visit.    Past Medical History  Diagnosis Date  . Osteoporosis     T8 compression fx 03/2015   . Hyperlipidemia   . Vitamin B12 deficiency   . breast ca 1978  . GERD (gastroesophageal reflux disease)   . IBS (irritable bowel syndrome)     Past Surgical History  Procedure Laterality Date  . Hemorrhoid surgery  1984    With subsequent correction of surgery  . Tubal ligation    . Left mastectomy  1978  . Left breast implant  1981  . Arm surgery      left  . Knee surgery      left  . Neck surgery  2008    c5-6    Social History   Social History  . Marital Status:  Married    Spouse Name: N/A  . Number of Children: N/A  . Years of Education: N/A   Social History Main Topics  . Smoking status: Never Smoker   . Smokeless tobacco: Not on file  . Alcohol Use: No  . Drug Use: No  . Sexual Activity: Not on file   Other Topics Concern  . Not on file   Social History Narrative   Housewife, Lives with spouse, 2 children    Family History  Problem Relation Age of Onset  . Diabetes Sister   . Diabetes Brother   . Breast cancer Maternal Aunt   . Colon cancer Neg Hx     Review of Systems  Gastrointestinal:       GERD frequently  Musculoskeletal: Positive for myalgias, back pain and neck pain.  Neurological: Positive for headaches.  Psychiatric/Behavioral: Negative for dysphoric mood. The patient is nervous/anxious.        Objective:   Filed Vitals:   11/13/15 1120  BP: 154/70  Pulse: 67  Temp: 98.3 F (36.8 C)  Resp: 16   Filed Weights   11/13/15 1120  Weight: 163 lb (73.936 kg)   Body mass index is 29.81 kg/(m^2).   Physical Exam  Constitutional: She appears well-developed and well-nourished. No distress.  Skin: She is not diaphoretic.  Psychiatric: She has a normal mood and affect. Her behavior is normal. Judgment and thought content normal.       Assessment & Plan:   See Problem List for Assessment and Plan of chronic medical problems.   Has an appt next month to establish   30 minutes were spent face-to-face with the patient, over 50% of which was spent counseling regarding her osteoporosis, GERD and anxiety

## 2015-11-28 ENCOUNTER — Other Ambulatory Visit: Payer: Self-pay

## 2015-11-28 DIAGNOSIS — Z1231 Encounter for screening mammogram for malignant neoplasm of breast: Secondary | ICD-10-CM

## 2015-12-14 ENCOUNTER — Ambulatory Visit
Admission: RE | Admit: 2015-12-14 | Discharge: 2015-12-14 | Disposition: A | Discharge status: Home / Self Care | Payer: Medicare Other | Source: Ambulatory Visit

## 2015-12-14 DIAGNOSIS — Z981 Arthrodesis status: Secondary | ICD-10-CM | POA: Diagnosis not present

## 2015-12-14 DIAGNOSIS — M5030 Other cervical disc degeneration, unspecified cervical region: Secondary | ICD-10-CM | POA: Diagnosis not present

## 2015-12-14 DIAGNOSIS — Z1231 Encounter for screening mammogram for malignant neoplasm of breast: Secondary | ICD-10-CM

## 2015-12-14 DIAGNOSIS — S22060A Wedge compression fracture of T7-T8 vertebra, initial encounter for closed fracture: Secondary | ICD-10-CM | POA: Diagnosis not present

## 2015-12-14 DIAGNOSIS — M47812 Spondylosis without myelopathy or radiculopathy, cervical region: Secondary | ICD-10-CM | POA: Diagnosis not present

## 2015-12-14 DIAGNOSIS — M542 Cervicalgia: Secondary | ICD-10-CM | POA: Diagnosis not present

## 2015-12-17 ENCOUNTER — Telehealth: Payer: Self-pay

## 2015-12-17 ENCOUNTER — Encounter: Payer: Self-pay | Admitting: Internal Medicine

## 2015-12-17 ENCOUNTER — Ambulatory Visit: Payer: Medicare Other | Admitting: Internal Medicine

## 2015-12-17 ENCOUNTER — Inpatient Hospital Stay: Admission: RE | Admit: 2015-12-17 | Payer: Medicare Other | Source: Ambulatory Visit

## 2015-12-17 ENCOUNTER — Ambulatory Visit (INDEPENDENT_AMBULATORY_CARE_PROVIDER_SITE_OTHER): Payer: Medicare Other | Admitting: Internal Medicine

## 2015-12-17 VITALS — BP 114/60 | HR 69 | Temp 98.1°F | Resp 18 | Wt 165.0 lb

## 2015-12-17 DIAGNOSIS — F419 Anxiety disorder, unspecified: Secondary | ICD-10-CM

## 2015-12-17 DIAGNOSIS — R1310 Dysphagia, unspecified: Secondary | ICD-10-CM

## 2015-12-17 DIAGNOSIS — K219 Gastro-esophageal reflux disease without esophagitis: Secondary | ICD-10-CM

## 2015-12-17 DIAGNOSIS — R7303 Prediabetes: Secondary | ICD-10-CM | POA: Diagnosis not present

## 2015-12-17 DIAGNOSIS — M810 Age-related osteoporosis without current pathological fracture: Secondary | ICD-10-CM | POA: Diagnosis not present

## 2015-12-17 MED ORDER — ESOMEPRAZOLE MAGNESIUM 40 MG PO CPDR: 40.0000 mg | DELAYED_RELEASE_CAPSULE | Freq: Every day | ORAL | Status: DC

## 2015-12-17 MED ORDER — RANITIDINE HCL 150 MG PO TABS: 150.0000 mg | ORAL_TABLET | Freq: Every day | ORAL | Status: DC

## 2015-12-17 NOTE — Addendum Note (Signed)
Addended by: [name] on: 12/17/2015 08:55 AM   Modules accepted: Orders

## 2015-12-17 NOTE — Assessment & Plan Note (Signed)
Controlled  Continue cymbalta at current dose

## 2015-12-17 NOTE — Telephone Encounter (Signed)
-----   Message from [name], MD sent at 12/17/2015  8:48 AM EDT ----- I would like her to start prolia

## 2015-12-17 NOTE — Telephone Encounter (Signed)
Patient has been given prolia advisement by dr burns, [name] is putting patient in prolia portal to have insurance verified and get estimated copay if any---Zaki Gertsch will call patient back after i have insurance verification to schedule prolia injection nurse visit

## 2015-12-17 NOTE — Progress Notes (Signed)
Pre visit review using our clinic review tool, if applicable. No additional management support is needed unless otherwise documented below in the visit note. 

## 2015-12-17 NOTE — Assessment & Plan Note (Signed)
Not controlled and having dysphagia and hoarseness Has had esophageal dilation in the past Continue nexium daily Add zantac at night If not controlled go back to nexium twice daily Referral to dr stark for possible egd/dilation

## 2015-12-17 NOTE — Progress Notes (Signed)
Subjective:    Patient ID: [name], female    DOB: 07-Feb-1935, 81 y.o.   MRN: [id number]  HPI She is here for follow up.  GERD:  She is taking her medication once daily.  The nexium was prescribed for twice a day, but she did not want to take it twice due to her osteoporosis.  She has GERD symptoms almost daily.  She does have difficulty swallowing pills.  She does have difficulty swallowing food at times.  She feels like water sometimes has difficulty going down - it "puddles" in her throat.  She did have an EGD in the past and had a dilation.  She feels her dysphagia is getting worse.    Anxiety:  She is taking the cymbalta daily.  She denies any of the electric shocks she was having previously and does not feel she is having any side effects from the medication.    Osteoporosis:  She has had a T8 compression fracture.  She has been on calcitonin since then to help with the pain from the fracture.  She has GERD and is not a candidate for oral bisphosphonate.  Initially her dentist did not think the prolia would be a good idea,but she has seen him recently and her bones were good in her jaw.  She is doing some walking.    Prediabetes:  She is doing some walking.  She is compliant with a low sugar diet, but has difficulty controlling the carbohydrates.  She has gained weight and has difficulty losing weight.    Her legs are heavy when she starts to walk.  After a while her legs bother her and she has to rest.  She is following with Dr Alfonso Ramus.   Medications and allergies reviewed with patient and updated if appropriate.  Patient Active Problem List   Diagnosis Date Noted  . Prediabetes 12/17/2015  . Anxiety 11/13/2015  . T8 vertebral fracture ([place]) 04/09/2015  . Hyperglycemia 06/19/2014  . Overweight 10/09/2011  . Allergic rhinitis, cause unspecified   . GERD (gastroesophageal reflux disease) 02/05/2011  . EPISTAXIS 07/23/2010  . FATIGUE 09/28/2009  . VITAMIN B12 DEFICIENCY  04/17/2009  . Hyperlipidemia 04/17/2009  . Osteoporosis 04/17/2009  . ADENOCARCINOMA, BREAST, HX OF 04/17/2009  . CONSTIPATION 03/08/2009  . COLONIC POLYPS, HYPERPLASTIC, HX OF 03/06/2009    Current Outpatient Prescriptions on File Prior to Visit  Medication Sig Dispense Refill  . aspirin 81 MG tablet Take 81 mg by mouth daily.    . calcitonin, salmon, (MIACALCIN/FORTICAL) 200 UNIT/ACT nasal spray Place 1 spray into alternate nostrils daily. 3.7 mL 5  . calcium carbonate (TUMS EX) 750 MG chewable tablet Chew 2 tablets by mouth as needed.     . cholecalciferol (VITAMIN D) 1000 UNITS tablet Take 2,000 Units by mouth daily.    . DULoxetine (CYMBALTA) 20 MG capsule Take 1 capsule (20 mg total) by mouth daily. 30 capsule 3  . esomeprazole (NEXIUM) 40 MG capsule Take 1 capsule (40 mg total) by mouth 2 (two) times daily before a meal. 60 capsule 5  . loratadine (CLARITIN) 10 MG tablet Take 1 tablet (10 mg total) by mouth daily. 30 tablet 11  . Omega-3 Fatty Acids (FISH OIL PO) Take by mouth.    . polyethylene glycol (MIRALAX / GLYCOLAX) packet Take 17 g by mouth daily as needed.     . vitamin B-12 (CYANOCOBALAMIN) 1000 MCG tablet Take 1,000 mcg by mouth daily.  No current facility-administered medications on file prior to visit.    Past Medical History  Diagnosis Date  . Osteoporosis     T8 compression fx 03/2015   . Hyperlipidemia   . Vitamin B12 deficiency   . breast ca 1978  . GERD (gastroesophageal reflux disease)   . IBS (irritable bowel syndrome)     Past Surgical History  Procedure Laterality Date  . Hemorrhoid surgery  1984    With subsequent correction of surgery  . Tubal ligation    . Left mastectomy  1978  . Left breast implant  1981  . Arm surgery      left  . Knee surgery      left  . Neck surgery  2008    c5-6    Social History   Social History  . Marital Status: Married    Spouse Name: N/A  . Number of Children: N/A  . Years of Education: N/A    Social History Main Topics  . Smoking status: Never Smoker   . Smokeless tobacco: Not on file  . Alcohol Use: No  . Drug Use: No  . Sexual Activity: Not on file   Other Topics Concern  . Not on file   Social History Narrative   Housewife, Lives with spouse, 2 children    Family History  Problem Relation Age of Onset  . Diabetes Sister   . Diabetes Brother   . Breast cancer Maternal Aunt   . Colon cancer Neg Hx     Review of Systems  Constitutional: Negative for fever.  HENT: Positive for trouble swallowing and voice change (hoarseness).   Respiratory: Negative for cough, shortness of breath and wheezing.   Cardiovascular: Negative for chest pain, palpitations and leg swelling.  Neurological: Negative for dizziness, light-headedness and headaches.       Objective:   Filed Vitals:   12/17/15 0801  BP: 114/60  Pulse: 69  Temp: 98.1 F (36.7 C)  Resp: 18   Filed Weights   12/17/15 0801  Weight: 165 lb (74.844 kg)   Body mass index is 30.17 kg/(m^2).   Physical Exam Constitutional: Appears well-developed and well-nourished. No distress.  Neck: Neck supple. No tracheal deviation present. No thyromegaly present.  No carotid bruit. No cervical adenopathy.   Cardiovascular: Normal rate, regular rhythm and normal heart sounds.   No murmur heard.  No edema Pulmonary/Chest: Effort normal and breath sounds normal. No respiratory distress. No wheezes.  Psych: normal mood and affect       Assessment & Plan:   See Problem List for Assessment and Plan of chronic medical problems.

## 2015-12-17 NOTE — Patient Instructions (Addendum)
   All other Health Maintenance issues reviewed.   All recommended immunizations and age-appropriate screenings are up-to-date or discussed.  No immunizations administered today.   Medications reviewed and updated.  Changes include adding zantac at night for her heartburn.   Stop the calcitonin.    A referral for GI was ordered for Dr Fuller Plan.   Please followup in 6 months

## 2015-12-17 NOTE — Assessment & Plan Note (Signed)
dexa today Stop calcitonin Look into prolia Calcium and vitamin d daily (1200mg  , 2000 units) Regular walking

## 2015-12-17 NOTE — Assessment & Plan Note (Signed)
EGD in past with dilation per patient  Will refer to GI

## 2016-01-01 ENCOUNTER — Telehealth: Payer: Self-pay

## 2016-01-01 NOTE — Telephone Encounter (Signed)
Called patient to advise that insurance has been verified for prolia injections with $0 copay needed---patient is not sure of her schedule right now and will call back to schedule nurse visit for prolia injection---can talk with Alyannah Sanks if any questions

## 2016-01-07 ENCOUNTER — Ambulatory Visit (INDEPENDENT_AMBULATORY_CARE_PROVIDER_SITE_OTHER): Payer: Medicare Other

## 2016-01-07 DIAGNOSIS — M810 Age-related osteoporosis without current pathological fracture: Secondary | ICD-10-CM | POA: Diagnosis not present

## 2016-01-07 MED ORDER — DENOSUMAB 60 MG/ML ~~LOC~~ SOLN
60.0000 mg | Freq: Once | SUBCUTANEOUS | Status: AC
  Administered 2016-01-07: 60 mg via SUBCUTANEOUS

## 2016-01-10 DIAGNOSIS — H04123 Dry eye syndrome of bilateral lacrimal glands: Secondary | ICD-10-CM | POA: Diagnosis not present

## 2016-01-21 DIAGNOSIS — H6983 Other specified disorders of Eustachian tube, bilateral: Secondary | ICD-10-CM | POA: Diagnosis not present

## 2016-01-21 DIAGNOSIS — H9193 Unspecified hearing loss, bilateral: Secondary | ICD-10-CM | POA: Diagnosis not present

## 2016-01-21 DIAGNOSIS — J310 Chronic rhinitis: Secondary | ICD-10-CM | POA: Diagnosis not present

## 2016-01-21 DIAGNOSIS — H9313 Tinnitus, bilateral: Secondary | ICD-10-CM | POA: Diagnosis not present

## 2016-01-21 DIAGNOSIS — K219 Gastro-esophageal reflux disease without esophagitis: Secondary | ICD-10-CM | POA: Diagnosis not present

## 2016-02-15 ENCOUNTER — Ambulatory Visit (INDEPENDENT_AMBULATORY_CARE_PROVIDER_SITE_OTHER): Payer: Medicare Other | Admitting: Internal Medicine

## 2016-02-15 ENCOUNTER — Encounter: Payer: Self-pay | Admitting: Internal Medicine

## 2016-02-15 VITALS — BP 140/68 | HR 69 | Temp 98.3°F | Resp 16 | Wt 165.0 lb

## 2016-02-15 DIAGNOSIS — K219 Gastro-esophageal reflux disease without esophagitis: Secondary | ICD-10-CM

## 2016-02-15 DIAGNOSIS — F419 Anxiety disorder, unspecified: Secondary | ICD-10-CM

## 2016-02-15 DIAGNOSIS — M542 Cervicalgia: Secondary | ICD-10-CM | POA: Diagnosis not present

## 2016-02-15 DIAGNOSIS — M79609 Pain in unspecified limb: Secondary | ICD-10-CM | POA: Insufficient documentation

## 2016-02-15 DIAGNOSIS — R221 Localized swelling, mass and lump, neck: Secondary | ICD-10-CM

## 2016-02-15 DIAGNOSIS — M810 Age-related osteoporosis without current pathological fracture: Secondary | ICD-10-CM

## 2016-02-15 DIAGNOSIS — M79606 Pain in leg, unspecified: Secondary | ICD-10-CM

## 2016-02-15 MED ORDER — GABAPENTIN 100 MG PO CAPS: ORAL_CAPSULE | ORAL | Status: DC

## 2016-02-15 NOTE — Assessment & Plan Note (Signed)
With possible cervical radiculopathy Has seen dr Durene Cal - he did not feel there was anything wrong with her surgery and an MRI was not necessary - ? Thoughts regarding pain Trial of gabapentin at night - has not tried in past Gabapentin 100 mg HS - increase to 200 mg if tolerated - discussed side effect of drowsiness Refer to neuro for help with diagnosis Follow up in one month

## 2016-02-15 NOTE — Patient Instructions (Addendum)
   Medications reviewed and updated.  Changes include starting gabapentin at night.   Your prescription(s) have been submitted to your pharmacy. Please take as directed and contact our office if you believe you are having problem(s) with the medication(s).  A referral ws ordered for neurology.   Please followup in 1 month

## 2016-02-15 NOTE — Assessment & Plan Note (Signed)
Palpable nodule left neck Will check [place]

## 2016-02-15 NOTE — Assessment & Plan Note (Signed)
Dyphagia, cough and GERD Sees Dr Fuller Plan next week Continue current medicaitons

## 2016-02-15 NOTE — Assessment & Plan Note (Signed)
Pain in legs - ? Lumbar radiculopathy Will refer to neuro for further evaluation Trial of gabapentin Follow up in one month

## 2016-02-15 NOTE — Progress Notes (Signed)
Pre visit review using our clinic review tool, if applicable. No additional management support is needed unless otherwise documented below in the visit note. 

## 2016-02-15 NOTE — Assessment & Plan Note (Signed)
Had first prolia injection - plan for second in 6 months

## 2016-02-15 NOTE — Progress Notes (Signed)
Subjective:    Patient ID: [name], female    DOB: September 24, 1934, 81 y.o.   MRN: [id number]  HPI She is here for follow up.  Anxiety:  She came off the cymbalta.  She tapered herself off slowly.  She does think she was having side effects (difficulty urinating, change in vision, dulled her too much).  She thinks it did help with her neck pain and with anxiety.  She has waves of a sensation and is unsure if it is anxiety or what it is.    Neck pain:  She did see Dr Durene Cal.  Everything with there prior surgery looked good.  He did not feel an a MRI was necessary.    The neck pain radiates to her head.  It can cause spasms.  She sometimes gets pain in her head or eye.  She has numbness/tinlging in her left hand, which is not new.  The neck pain bothers her and should would like to know what is causing the pain and something to help treat it. The cymbalta did help a little.    Cough:  She has been coughing for months.   She did see ENT - Dr Wilburn Cornelia for a noise in her head.  He put her on a nasal spray. He told her she had inflammation in her throat.   GERD:  She sees Dr Fuller Plan next week.  She has difficulty swallowing.  She has GERD and is taking nexium and zantac daily. She has difficulty swallowing pills.   Pain in her legs:  She has pain in the posterior legs.  She has had a couple of episodes of significant pain and had difficulty walking.   It feels like a tightness and she has difficulty walking and then they feel like jelly.  There is always some tightness in her posterior legs.  She has a little tingling in her right lower leg.    OP:  She did have prolia 01/07/16.  She had some tightness in her jaw about 10 days after the injection and was not sure if that was a side effect.  It did resolve.   Neck nodule:  She felt a nodule of lump in the left side of her neck.  It is nontender, but has not gone away.  She wondered if it was related to her thyroid.    Medications and  allergies reviewed with patient and updated if appropriate.  Patient Active Problem List   Diagnosis Date Noted  . Prediabetes 12/17/2015  . Dysphagia 12/17/2015  . Anxiety 11/13/2015  . T8 vertebral fracture ([place]) 04/09/2015  . Overweight 10/09/2011  . Allergic rhinitis, cause unspecified   . GERD (gastroesophageal reflux disease) 02/05/2011  . EPISTAXIS 07/23/2010  . FATIGUE 09/28/2009  . VITAMIN B12 DEFICIENCY 04/17/2009  . Hyperlipidemia 04/17/2009  . Osteoporosis 04/17/2009  . ADENOCARCINOMA, BREAST, HX OF 04/17/2009  . CONSTIPATION 03/08/2009  . COLONIC POLYPS, HYPERPLASTIC, HX OF 03/06/2009    Current Outpatient Prescriptions on File Prior to Visit  Medication Sig Dispense Refill  . aspirin 81 MG tablet Take 81 mg by mouth daily.    . cholecalciferol (VITAMIN D) 1000 UNITS tablet Take 2,000 Units by mouth daily.    [name] esomeprazole (NEXIUM) 40 MG capsule Take 1 capsule (40 mg total) by mouth daily. 60 capsule 5  . loratadine (CLARITIN) 10 MG tablet Take 1 tablet (10 mg total) by mouth daily. 30 tablet 11  . Omega-3 Fatty Acids (FISH OIL PO)  Take by mouth.    . polyethylene glycol (MIRALAX / GLYCOLAX) packet Take 17 g by mouth daily as needed.     . ranitidine (ZANTAC) 150 MG tablet Take 1 tablet (150 mg total) by mouth at bedtime. 90 tablet 1  . vitamin B-12 (CYANOCOBALAMIN) 1000 MCG tablet Take 1,000 mcg by mouth daily.       No current facility-administered medications on file prior to visit.    Past Medical History  Diagnosis Date  . Osteoporosis     T8 compression fx 03/2015   . Hyperlipidemia   . Vitamin B12 deficiency   . breast ca 1978  . GERD (gastroesophageal reflux disease)   . IBS (irritable bowel syndrome)     Past Surgical History  Procedure Laterality Date  . Hemorrhoid surgery  1984    With subsequent correction of surgery  . Tubal ligation    . Left mastectomy  1978  . Left breast implant  1981  . Arm surgery      left  . Knee surgery       left  . Neck surgery  2008    c5-6    Social History   Social History  . Marital Status: Married    Spouse Name: N/A  . Number of Children: N/A  . Years of Education: N/A   Social History Main Topics  . Smoking status: Never Smoker   . Smokeless tobacco: Not on file  . Alcohol Use: No  . Drug Use: No  . Sexual Activity: Not on file   Other Topics Concern  . Not on file   Social History Narrative   Housewife, Lives with spouse, 2 children    Family History  Problem Relation Age of Onset  . Diabetes Sister   . Diabetes Brother   . Breast cancer Maternal Aunt   . Colon cancer Neg Hx     Review of Systems  Eyes: Positive for pain (related to neck pain).  Musculoskeletal: Positive for back pain, neck pain and neck stiffness.  Neurological: Positive for numbness (chornic N/T left 4-5th fingers, right lower leg) and headaches. Negative for weakness.  Psychiatric/Behavioral: The patient is nervous/anxious.        Objective:   Filed Vitals:   02/15/16 1602  BP: 140/68  Pulse: 69  Temp: 98.3 F (36.8 C)  Resp: 16   Filed Weights   02/15/16 1602  Weight: 165 lb (74.844 kg)   Body mass index is 30.17 kg/(m^2).   Physical Exam  Constitutional: She is oriented to person, place, and time.  Neck: Neck supple. No tracheal deviation present.  Small nodule left side of neck - ? Thyroid nodule vs lymph node  Musculoskeletal: She exhibits no edema.  Posterior neck tenderness with palpation, muscle tightness upper back  Lymphadenopathy:    She has no cervical adenopathy.  Neurological: She is alert and oriented to person, place, and time. No cranial nerve deficit. She exhibits normal muscle tone. Coordination normal.  Normal sensation and strength all extremities  Skin: Skin is warm and dry.          Assessment & Plan:   See Problem List for Assessment and Plan of chronic medical problems.   Thyroid US - nodule left neck

## 2016-02-15 NOTE — Assessment & Plan Note (Signed)
Stopped cymbalta due to side effects ? Still having some anxiety Will see if gabapentin helps her pain - may also help her anxiety Will consider an additional medication for anxiety if needed

## 2016-02-19 ENCOUNTER — Encounter: Payer: Self-pay | Admitting: Gastroenterology

## 2016-02-19 ENCOUNTER — Ambulatory Visit (INDEPENDENT_AMBULATORY_CARE_PROVIDER_SITE_OTHER): Payer: Medicare Other | Admitting: Gastroenterology

## 2016-02-19 ENCOUNTER — Telehealth: Payer: Self-pay | Admitting: Emergency Medicine

## 2016-02-19 VITALS — BP 118/60 | HR 60 | Ht 62.0 in | Wt 166.0 lb

## 2016-02-19 DIAGNOSIS — K219 Gastro-esophageal reflux disease without esophagitis: Secondary | ICD-10-CM | POA: Diagnosis not present

## 2016-02-19 DIAGNOSIS — R1319 Other dysphagia: Secondary | ICD-10-CM

## 2016-02-19 MED ORDER — ESOMEPRAZOLE MAGNESIUM 40 MG PO CPDR: 40.0000 mg | DELAYED_RELEASE_CAPSULE | Freq: Two times a day (BID) | ORAL | 2 refills | Status: DC

## 2016-02-19 NOTE — Telephone Encounter (Signed)
Spoke to pt to clarify

## 2016-02-19 NOTE — Patient Instructions (Signed)
Increase your Nexium to 40 mg twice daily. A new prescription has been sent to your pharmacy.   Patient advised to avoid spicy, acidic, citrus, chocolate, mints, fruit and fruit juices.  Limit the intake of caffeine, alcohol and Soda.  Don't exercise too soon after eating.  Don't lie down within 3-4 hours of eating.  Elevate the head of your bed.  You have been scheduled for a Barium Esophogram at [facility] (1st floor of the hospital) on 03/03/16 at 11:00am. Please arrive 15 minutes prior to your appointment for registration. Make certain not to have anything to eat or drink 6 hours prior to your test. If you need to reschedule for any reason, please contact radiology at [phone] to do so. __________________________________________________________________ A barium swallow is an examination that concentrates on views of the esophagus. This tends to be a double contrast exam (barium and two liquids which, when combined, create a gas to distend the wall of the oesophagus) or single contrast (non-ionic iodine based). The study is usually tailored to your symptoms so a good history is essential. Attention is paid during the study to the form, structure and configuration of the esophagus, looking for functional disorders (such as aspiration, dysphagia, achalasia, motility and reflux) EXAMINATION You may be asked to change into a gown, depending on the type of swallow being performed. A radiologist and radiographer will perform the procedure. The radiologist will advise you of the type of contrast selected for your procedure and direct you during the exam. You will be asked to stand, sit or lie in several different positions and to hold a small amount of fluid in your mouth before being asked to swallow while the imaging is performed .In some instances you may be asked to swallow barium coated marshmallows to assess the motility of a solid food bolus. The exam can be recorded as a digital or video  fluoroscopy procedure. POST PROCEDURE It will take 1-2 days for the barium to pass through your system. To facilitate this, it is important, unless otherwise directed, to increase your fluids for the next 24-48hrs and to resume your normal diet.  This test typically takes about 30 minutes to perform. _______________________________________________________________________________  Thank you for choosing me and [facility].  Pricilla Riffle. Dagoberto Ligas., MD., [facility]

## 2016-02-19 NOTE — Telephone Encounter (Signed)
Pt called and has questions about her thyroid ultrasound at [facility] imaging. Please give her a call back. Thanks.

## 2016-02-19 NOTE — Progress Notes (Signed)
History of Present Illness: This is a 81 year old female referred by [name], MD for the evaluation of dysphagia and GERD. Prior endoscopies in 1999 and 2006 did not reveal a stricture and symptoms responded to treatment for GERD. For the past several months she has noted worsening reflux symptoms with intermittent solid and liquid dysphagia. She started taking Nexium on a daily basis and ranitidine was added. Her symptoms have improved but not resolved. She was evaluated by ENT recently for ear symptoms of hoarseness and she was prescribed nasal spray. Denies weight loss, abdominal pain, constipation, diarrhea, change in stool caliber, melena, hematochezia, nausea, vomiting, chest pain.   Review of Systems: Pertinent positive and negative review of systems were noted in the above HPI section. All other review of systems were otherwise negative.  Allergies  Allergen Reactions  . Cymbalta [Duloxetine Hcl]     Dulled her too much, difficulty urinating, change in vision  . Statins    Outpatient Medications Prior to Visit  Medication Sig Dispense Refill  . aspirin 81 MG tablet Take 81 mg by mouth daily.    . Calcium Citrate-Vitamin D (CALCIUM CITRATE + PO) Take by mouth 2 (two) times daily.    . cholecalciferol (VITAMIN D) 1000 UNITS tablet Take 2,000 Units by mouth daily.    [name] esomeprazole (NEXIUM) 40 MG capsule Take 1 capsule (40 mg total) by mouth daily. 60 capsule 5  . gabapentin (NEURONTIN) 100 MG capsule Take one pill at bedtime, increase to two pills at bedtime after two days if tolerated 90 capsule 3  . loratadine (CLARITIN) 10 MG tablet Take 1 tablet (10 mg total) by mouth daily. 30 tablet 11  . Omega-3 Fatty Acids (FISH OIL PO) Take by mouth.    . polyethylene glycol (MIRALAX / GLYCOLAX) packet Take 17 g by mouth daily as needed.     . ranitidine (ZANTAC) 150 MG tablet Take 1 tablet (150 mg total) by mouth at bedtime. 90 tablet 1  . vitamin B-12 (CYANOCOBALAMIN) 1000 MCG  tablet Take 1,000 mcg by mouth daily.       No facility-administered medications prior to visit.    Past Medical History:  Diagnosis Date  . breast ca 1978  . GERD (gastroesophageal reflux disease)   . Hyperlipidemia   . IBS (irritable bowel syndrome)   . Osteoporosis    T8 compression fx 03/2015   . Vitamin B12 deficiency    Past Surgical History:  Procedure Laterality Date  . arm surgery     left  . [place]   With subsequent correction of surgery  . KNEE SURGERY     left  . Left Breast Implant  1981  . Left Mastectomy  1978  . NECK SURGERY  2008   c5-6  . TUBAL LIGATION     Social History   Social History  . Marital status: Married    Spouse name: N/A  . Number of children: N/A  . Years of education: N/A   Social History Main Topics  . Smoking status: Never Smoker  . Smokeless tobacco: Never Used  . Alcohol use No  . Drug use: No  . Sexual activity: Not Asked   Other Topics Concern  . None   Social History Narrative   Housewife, Lives with spouse, 2 children   Family History  Problem Relation Age of Onset  . Diabetes Sister   . Diabetes Brother   . Breast cancer Maternal Aunt   .  Colon cancer Neg Hx       Physical Exam: General: Well developed, well nourished, no acute distress Head: Normocephalic and atraumatic Eyes:  sclerae anicteric, EOMI Ears: Normal auditory acuity Mouth: No deformity or lesions Neck: Supple, no masses or thyromegaly Lungs: Clear throughout to auscultation Heart: Regular rate and rhythm; no murmurs, rubs or bruits Abdomen: Soft, non tender and non distended. No masses, hepatosplenomegaly or hernias noted. Normal Bowel sounds Rectal: Musculoskeletal: Symmetrical with no gross deformities  Skin: No lesions on visible extremities Pulses:  Normal pulses noted Extremities: No clubbing, cyanosis, edema or deformities noted Neurological: Alert oriented x 4, grossly nonfocal Cervical Nodes:  No significant  cervical adenopathy Inguinal Nodes: No significant inguinal adenopathy Psychological:  Alert and cooperative. Normal mood and affect  Assessment and Recommendations:  1. GERD and dysphagia. Hoarseness may be related to an ENT disorder or LPR. Increase Nexium to 40 mg twice daily taken before breakfast and evening meal. Hold Zantac for now. Closely follow all antireflux measures. Schedule barium esophagram. Consider EGD pending findings on barium esophagram and symptom response. REV in 3 months.   cc: [name], MD [place], [place] [id number]

## 2016-02-21 ENCOUNTER — Ambulatory Visit
Admission: RE | Admit: 2016-02-21 | Discharge: 2016-02-21 | Disposition: A | Discharge status: Home / Self Care | Payer: Medicare Other | Source: Ambulatory Visit | Attending: Internal Medicine | Admitting: Internal Medicine

## 2016-02-21 ENCOUNTER — Encounter: Payer: Self-pay | Admitting: Internal Medicine

## 2016-02-21 DIAGNOSIS — H01002 Unspecified blepharitis right lower eyelid: Secondary | ICD-10-CM | POA: Diagnosis not present

## 2016-02-21 DIAGNOSIS — H524 Presbyopia: Secondary | ICD-10-CM | POA: Diagnosis not present

## 2016-02-21 DIAGNOSIS — H01005 Unspecified blepharitis left lower eyelid: Secondary | ICD-10-CM | POA: Diagnosis not present

## 2016-02-21 DIAGNOSIS — G43809 Other migraine, not intractable, without status migrainosus: Secondary | ICD-10-CM | POA: Diagnosis not present

## 2016-02-21 DIAGNOSIS — H2513 Age-related nuclear cataract, bilateral: Secondary | ICD-10-CM | POA: Diagnosis not present

## 2016-02-21 DIAGNOSIS — H01001 Unspecified blepharitis right upper eyelid: Secondary | ICD-10-CM | POA: Diagnosis not present

## 2016-02-21 DIAGNOSIS — E042 Nontoxic multinodular goiter: Secondary | ICD-10-CM | POA: Insufficient documentation

## 2016-02-21 DIAGNOSIS — R221 Localized swelling, mass and lump, neck: Secondary | ICD-10-CM

## 2016-02-21 DIAGNOSIS — H04123 Dry eye syndrome of bilateral lacrimal glands: Secondary | ICD-10-CM | POA: Diagnosis not present

## 2016-02-21 DIAGNOSIS — H01004 Unspecified blepharitis left upper eyelid: Secondary | ICD-10-CM | POA: Diagnosis not present

## 2016-03-03 ENCOUNTER — Ambulatory Visit (HOSPITAL_COMMUNITY)
Admission: RE | Admit: 2016-03-03 | Discharge: 2016-03-03 | Disposition: A | Discharge status: Home / Self Care | Payer: Medicare Other | Source: Ambulatory Visit | Attending: Gastroenterology | Admitting: Gastroenterology

## 2016-03-03 ENCOUNTER — Telehealth: Payer: Self-pay

## 2016-03-03 DIAGNOSIS — R1319 Other dysphagia: Secondary | ICD-10-CM | POA: Diagnosis not present

## 2016-03-03 DIAGNOSIS — K219 Gastro-esophageal reflux disease without esophagitis: Secondary | ICD-10-CM | POA: Diagnosis not present

## 2016-03-03 DIAGNOSIS — R1310 Dysphagia, unspecified: Secondary | ICD-10-CM | POA: Diagnosis not present

## 2016-03-03 NOTE — Telephone Encounter (Signed)
Pt called stating she increase her Gabapentin as discussed at her last OV but had to D/C after 3 days due to side effects - dizziness and increased sedation and loss of balance.  Pt discussed side effects with pharmacist prior to discontinuation and plans to discuss further with PCP at upcoming [place] 08/18.  Medication added tot pt's allergy list.  FYI

## 2016-03-04 NOTE — Telephone Encounter (Signed)
Noted. med list updated.

## 2016-03-11 ENCOUNTER — Ambulatory Visit (INDEPENDENT_AMBULATORY_CARE_PROVIDER_SITE_OTHER): Payer: Medicare Other | Admitting: Neurology

## 2016-03-11 ENCOUNTER — Encounter: Payer: Self-pay | Admitting: Neurology

## 2016-03-11 VITALS — BP 120/80 | HR 64 | Ht 62.0 in | Wt 165.1 lb

## 2016-03-11 DIAGNOSIS — M6289 Other specified disorders of muscle: Secondary | ICD-10-CM | POA: Diagnosis not present

## 2016-03-11 DIAGNOSIS — M542 Cervicalgia: Secondary | ICD-10-CM | POA: Diagnosis not present

## 2016-03-11 DIAGNOSIS — IMO0001 Reserved for inherently not codable concepts without codable children: Secondary | ICD-10-CM

## 2016-03-11 DIAGNOSIS — R531 Weakness: Secondary | ICD-10-CM

## 2016-03-11 DIAGNOSIS — R6889 Other general symptoms and signs: Secondary | ICD-10-CM | POA: Diagnosis not present

## 2016-03-11 MED ORDER — TIZANIDINE HCL 2 MG PO TABS: 2.0000 mg | ORAL_TABLET | Freq: Every evening | ORAL | 3 refills | Status: DC | PRN

## 2016-03-11 NOTE — Patient Instructions (Addendum)
1.  MRI brain wwo contrast  2.  Routine EEG 3.  Start physical therapy for left arm and leg strengthening 4.  Start tizanidine 2mg  at bedtime  Return to clinic in 4 months

## 2016-03-11 NOTE — Progress Notes (Signed)
[facility] Clinic Note - Initial Visit   Date: 03/11/16  [name] [name] DOB: May 29, 1935   Dear Dr. Quay Burow:  Thank you for your kind referral of [name] [name] for consultation of leg [name] neck pain. Although her history is well known to you, please allow [place] to reiterate it for the purpose of our medical record. The patient was accompanied to the clinic by husband who also provides collateral information.     History of Present Illness: [name] [name] is a 81 y.o. right-handed Caucasian female with [name], [name] [name], [name] [name], [name] [name] presenting for evaluation of spells of bilateral leg [name] neck pain.    Since September 2016, she began having spells of wave of abnormal sensation in her head, feels dazed.  These spells are brief, lasting about 7-10 seconds [name] occurring sporadically up to several times per hour, but recently are occurring less frequently.  This is associated with bilateral leg tightness [name] weakness. Her husband says that she is unresponsive with open eyes [name] has a flat expression.  She denies loss of consciousness [name] is able to hear him.  They have noticed any triggers or patterns.  When she feels this coming on, she feels as if there is a knot in her throat, neck gets heavy, [name] legs get weak.  She usually tries to find somewhere to sit or hold onto something.  She does not have leg pain, back pain, or numbness/tingling.  She occasionally has a quiver over the legs.  She walks independently [name] has not had any falls.  In 2016, she was found to have acute T8 compression fracture [name] attributes it to doing core exercises at the [facility], because she has not sustained any falls.    She does not have leg pain or tightness independent of these spells.    She has a long history of neck pain radiating to her head, described as spasm [name] pulling sensation.  She has previously underwent C5-6 ACDF by Dr. Sherwood Gambler.  She was taking  Cymbalta for [name] [name] noticed that it also helped with her neck discomfort, but has discontinued this due to side effects.  She occasionally has numbness in her left hand.    Over the past few months, she has noticed greater weakness on the left side of her body. It did not start suddenly.  Out-side paper records, electronic medical record, [name] images have been reviewed where available [name] summarized as:  MRI thoracic spine wo contrast 04/23/2015: 1. Acute T8 vertebral body compression fracture with approximately 60% height loss. MRI lumbar spine wo contrast 02/28/2009:  Slight worsening of degenerative disease at L5-S1.  Left posterolateral disc herniation.  Mild facet [name] ligamentous hypertrophy.  Mild narrowing of the left lateral recess could cause neural irritation. MRi cervical spine 05/10/2006: Most significant finding is the disc disease at the C5-6 level with associated mass effect as described above.   Lab Results  Component Value Date   TSH 2.52 12/20/2013   Lab Results  Component Value Date   HGBA1C 5.9 12/20/2014   Lab Results  Component Value Date   VITAMINB12 >1500 (H) 02/26/2011    Past Medical History:  Diagnosis Date  . breast ca 1978  . [name] [name] (gastroesophageal reflux disease)   . Hyperlipidemia   . IBS (irritable bowel syndrome)   . Osteoporosis    T8 compression fx 03/2015   . Vitamin B12 deficiency     Past Surgical History:  Procedure  Laterality Date  . arm surgery     left  . [place]   With subsequent correction of surgery  . KNEE SURGERY     left  . Left Breast Implant  1981  . Left Mastectomy  1978  . NECK SURGERY  2008   c5-6  . TUBAL LIGATION       Medications:  Outpatient Encounter Prescriptions as of 03/11/2016  Medication Sig Note  . aspirin 81 MG tablet Take 81 mg by mouth daily.   . cholecalciferol (VITAMIN D) 1000 UNITS tablet Take 2,000 Units by mouth daily.   . Cyanocobalamin (RA VITAMIN B-12 TR) 1000 MCG TBCR Take  by mouth every other day.  03/11/2016: [facility] Received Sig: Take 1,000 mcg by mouth daily.  . DULoxetine (CYMBALTA) 20 MG capsule  03/11/2016: Received from: External Pharmacy  . esomeprazole (NEXIUM) 40 MG capsule Take 1 capsule (40 mg total) by mouth 2 (two) times daily before a meal.   . fluticasone (FLONASE) 50 MCG/ACT nasal spray  03/11/2016: Received from: External Pharmacy  . loratadine (CLARITIN) 10 MG tablet Take 1 tablet (10 mg total) by mouth daily.   . Omega-3 Fatty Acids (FISH OIL PO) Take by mouth.   . polyethylene glycol (MIRALAX / GLYCOLAX) packet Take 17 g by mouth daily as needed.    . ranitidine (ZANTAC) 150 MG tablet Take 1 tablet (150 mg total) by mouth at bedtime.   . [DISCONTINUED] Calcium Citrate-Vitamin D (CALCIUM CITRATE + PO) Take by mouth 2 (two) times daily.   [name] gabapentin (NEURONTIN) 100 MG capsule  03/11/2016: Received from: External Pharmacy  . tiZANidine (ZANAFLEX) 2 MG tablet Take 1 tablet (2 mg total) by mouth at bedtime as needed for muscle spasms.   . [DISCONTINUED] vitamin B-12 (CYANOCOBALAMIN) 1000 MCG tablet Take 1,000 mcg by mouth daily.      No facility-administered encounter medications on file as of 03/11/2016.      Allergies:  Allergies  Allergen Reactions  . Cymbalta [Duloxetine Hcl]     Dulled her too much, difficulty urinating, change in vision  . Gabapentin Other (See Comments)    Dizziness [name] sedation  . Statins     Family History: Family History  Problem Relation Age of Onset  . Diabetes Sister   . Diabetes Brother   . Breast cancer Maternal Aunt   . Colon cancer Neg Hx     Social History: Social History  Substance Use Topics  . Smoking status: Never Smoker  . Smokeless tobacco: Never Used  . Alcohol use No   Social History   Social History Narrative   Housewife, Lives with spouse, 2 children    Review of Systems:  CONSTITUTIONAL: No fevers, chills, night sweats, or weight loss.    EYES: No visual changes or eye pain ENT: No hearing changes.  No history of nose bleeds.   RESPIRATORY: No cough, wheezing [name] shortness of breath.   CARDIOVASCULAR: Negative for chest pain, [name] palpitations.   GI: Negative for abdominal discomfort, blood in stools or black stools.  No recent change in bowel habits.   GU:  No history of incontinence.   MUSCLOSKELETAL: +history of joint pain or swelling.  No myalgias.   SKIN: Negative for lesions, rash, [name] itching.   HEMATOLOGY/ONCOLOGY: Negative for prolonged bleeding, bruising easily, [name] swollen nodes.  No history of cancer.   ENDOCRINE: Negative for cold or heat intolerance, polydipsia or goiter.   PSYCH:  +  depression or [name] symptoms.   NEURO: As Above.   Vital Signs:  BP 120/80   Pulse 64   Ht 5\' 2"  (1.575 m)   Wt 165 lb 2 oz (74.9 kg)   SpO2 99%   BMI 30.20 kg/m    General Medical Exam:   General:  Well appearing, comfortable.   Eyes/ENT: see cranial nerve examination.   Neck: No masses appreciated.   Full range of motion without tenderness. She endorses muscle tenderness over the cervical muscles.  No carotid bruits. Respiratory:  Clear to auscultation, good air entry bilaterally.   Cardiac:  Regular rate [name] rhythm, no murmur.   Extremities:  No deformities, edema, or skin discoloration.  Skin:  No rashes or lesions.  Neurological Exam: MENTAL STATUS including orientation to time, place, person, recent [name] remote memory, attention span [name] concentration, language, [name] fund of knowledge is normal.  Speech is not dysarthric.  CRANIAL NERVES: II:  No visual field defects.  Unremarkable fundi.   III-IV-VI: Pupils equal round [name] reactive to light.  Normal conjugate, extra-ocular eye movements in all directions of gaze.  No nystagmus.  No ptosis.   V:  Normal facial sensation.  Jaw jerk is absent.   VII:  Normal facial symmetry [name] movements.  No pathologic facial reflexes.  VIII:  Normal hearing [name] vestibular  function.   IX-X:  Normal palatal movement.   XI:  Normal shoulder shrug [name] head rotation.   XII:  Normal tongue strength [name] range of motion, no deviation or fasciculation.  MOTOR:  Motor strength is intact on the right side [name] 5-/5 on the left side.  No atrophy, fasciculations or abnormal movements.  No pronator drift.  Tone is normal.    MSRs:  Right                                                                 Left brachioradialis 2+  brachioradialis 2+  biceps 2+  biceps 2+  triceps 2+  triceps 2+  patellar 2+  patellar 2+  ankle jerk 1+  ankle jerk 1+  Hoffman no  Hoffman no  plantar response down  plantar response down   SENSORY:  Reduced perception to pin prick, vibration, [name] temperature over the left arm [name] leg.  Mild sway with Rhomberg testing.  COORDINATION/GAIT: Normal finger-to- nose-finger on the right, mild dysmetria on the left.  Intact rapid alternating movements bilaterally.  Able to rise from a chair without using arms.  Gait narrow based [name] stable. Tandem [name] stressed gait intact.    IMPRESSION: [name] [name] is a 81 year-old female referred for evaluation of spells.  These spells are always stereotyped with sensation of "dazed feeling", unresponsiveness, neck pain/heaviness, [name] leg discomfort/generalized weakness.  Her exam shows some weakness on the left arm [name] leg, but this may be due to inconsistent effort.  Sensation is also reduced globally on the left side.  Her reflexes are symmetric [name] intact.  With her lateralizing findings, I will order MRI brain to look for a structural lesion.  She will also have routine EEG performed as these symptoms are always stereotyped, although it would an unusual seizure presentation.     PLAN/RECOMMENDATIONS:  1.  MRI brain wwo contrast  2.  Routine  EEG; if this is normal, she will need an ambulatory EEG 3.  Start physical therapy for neck stretching [name] left arm/leg strengthening 4.  Start tizanidine 2mg  at  bedtime  Return to clinic in 3-4 months.   The duration of this appointment visit was 60 minutes of face-to-face time with the patient.  Greater than 50% of this time was spent in counseling, explanation of diagnosis, planning of further management, [name] coordination of care.   Thank you for allowing me to participate in patient's care.  If I can answer any additional questions, I would be pleased to do so.    Sincerely,    Donika K. Posey Pronto, DO

## 2016-03-13 ENCOUNTER — Ambulatory Visit (INDEPENDENT_AMBULATORY_CARE_PROVIDER_SITE_OTHER): Payer: Medicare Other | Admitting: Neurology

## 2016-03-13 DIAGNOSIS — M542 Cervicalgia: Secondary | ICD-10-CM

## 2016-03-13 DIAGNOSIS — R531 Weakness: Secondary | ICD-10-CM

## 2016-03-13 DIAGNOSIS — R6889 Other general symptoms and signs: Secondary | ICD-10-CM | POA: Diagnosis not present

## 2016-03-13 DIAGNOSIS — IMO0001 Reserved for inherently not codable concepts without codable children: Secondary | ICD-10-CM

## 2016-03-13 NOTE — Procedures (Signed)
ELECTROENCEPHALOGRAM REPORT  Date of Study: 03/13/2016  Patient's Name: [name] MRN: [id number] Date of Birth: 02-28-1935  Referring Provider: Dr. Narda Amber  Clinical History: This is a 81 year old woman with recurrent episodes of a "dazed feeling," unresponsiveness, neck pain/heaviness, and leg discomfort/generalized weakness.   Medications: ASA, VITAMIN D, CYMBALTA, NEXIUM, FLONASE, CLARITIN, FISH OIL PO, (MIRALAX / GLYCOLAX, ZANTAC, NEURONTIN, [place]  Technical Summary: A multichannel digital EEG recording measured by the international 10-20 system with electrodes applied with paste and impedances below 5000 ohms performed in our laboratory with EKG monitoring in an awake and drowsy patient.  Hyperventilation and photic stimulation were performed.  The digital EEG was referentially recorded, reformatted, and digitally filtered in a variety of bipolar and referential montages for optimal display.    Description: The patient is awake and drowsy during the recording.  During maximal wakefulness, there is a symmetric, medium voltage 9.5-10 Hz posterior dominant rhythm that attenuates with eye opening.  The record is symmetric.  During drowsiness, there is an increase in theta slowing of the background, with shifting asymmetry over the bilateral temporal regions. Deeper stages of sleep were not seen..  Hyperventilation and photic stimulation did not elicit any abnormalities.  There were no epileptiform discharges or electrographic seizures seen.    EKG lead showed sinus bradycardia at 42 bpm.  Impression: This awake and drowsy EEG is normal.    Clinical Correlation: A normal EEG does not exclude a clinical diagnosis of epilepsy.  If further clinical questions remain, prolonged EEG may be helpful.  Clinical correlation is advised.   Ellouise Newer, M.D.

## 2016-03-14 ENCOUNTER — Encounter: Payer: Self-pay | Admitting: Internal Medicine

## 2016-03-14 ENCOUNTER — Ambulatory Visit (INDEPENDENT_AMBULATORY_CARE_PROVIDER_SITE_OTHER): Payer: Medicare Other | Admitting: Internal Medicine

## 2016-03-14 VITALS — BP 124/82 | HR 70 | Wt 165.0 lb

## 2016-03-14 DIAGNOSIS — M542 Cervicalgia: Secondary | ICD-10-CM | POA: Diagnosis not present

## 2016-03-14 DIAGNOSIS — E042 Nontoxic multinodular goiter: Secondary | ICD-10-CM | POA: Diagnosis not present

## 2016-03-14 DIAGNOSIS — M25511 Pain in right shoulder: Secondary | ICD-10-CM

## 2016-03-14 DIAGNOSIS — E538 Deficiency of other specified B group vitamins: Secondary | ICD-10-CM | POA: Diagnosis not present

## 2016-03-14 DIAGNOSIS — R7303 Prediabetes: Secondary | ICD-10-CM | POA: Diagnosis not present

## 2016-03-14 DIAGNOSIS — E785 Hyperlipidemia, unspecified: Secondary | ICD-10-CM

## 2016-03-14 NOTE — Assessment & Plan Note (Signed)
Associated with dec ROM Will ask PT for exercises Will let me know if she needs an official referral  Can see ortho if no improvement

## 2016-03-14 NOTE — Progress Notes (Signed)
Pre visit review using our clinic review tool, if applicable. No additional management support is needed unless otherwise documented below in the visit note. 

## 2016-03-14 NOTE — Assessment & Plan Note (Signed)
Check lipid panel, cmp 

## 2016-03-14 NOTE — Patient Instructions (Addendum)
Try the gabapentin 100mg  at night.  Test(s) ordered today. Your results will be released to [place] (or called to you) after review, usually within 72hours after test completion. If any changes need to be made, you will be notified at that same time.   Medications reviewed and updated.  No changes recommended at this time.

## 2016-03-14 NOTE — Assessment & Plan Note (Signed)
Check a1c 

## 2016-03-14 NOTE — Progress Notes (Signed)
Subjective:    Patient ID: [name], female    DOB: 1934/12/31, 81 y.o.   MRN: [id number]  HPI Chronic neck pain:  She did see her neurosurgeon, Dr Durene Cal, and he felt everything with her prior surgery was good.  No repeat imaging was thought to be necessary.  She has chronic neck pain.  The pain radiates to her head.  She has gets numbness/tingling in her left hand and arm ,especailly if she lays on the left side, which is related to her shoulder not her neck.  She has not tolerated cymbalta or gabapentin 200 mg.  She thinks she tolerated the 100 mg of gabapentin, but only took it for a couple of days.  She would be willing to try it again.     Dr Posey Pronto prescribed a muscle relaxer at night if she has a bad day - she has not tried it yet.  Dr Posey Pronto thought she was weak on the right side and she will be starting PT.     Right shoulder pain and decresaed ROM:  Recently she has had decreased ROM of her right shoulder and pain in the shoulder and upper arm.  She denies injury or accident. 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    Medications and allergies reviewed with patient and updated if appropriate.  Patient Active Problem List   Diagnosis Date Noted  . Multinodular goiter (nontoxic) 02/21/2016  . Neck pain 02/15/2016  . Pain in limb 02/15/2016  . Neck nodule 02/15/2016  . Prediabetes 12/17/2015  . Dysphagia 12/17/2015  . Anxiety 11/13/2015  . T8 vertebral fracture ([place]) 04/09/2015  . Overweight 10/09/2011  . Allergic rhinitis, cause unspecified   . GERD (gastroesophageal reflux disease) 02/05/2011  . EPISTAXIS 07/23/2010  . FATIGUE 09/28/2009  . Vitamin B 12 deficiency 04/17/2009  . Hyperlipidemia 04/17/2009  . Osteoporosis 04/17/2009  . ADENOCARCINOMA, BREAST, HX OF 04/17/2009  . CONSTIPATION 03/08/2009  . COLONIC POLYPS, HYPERPLASTIC, HX OF 03/06/2009    Current Outpatient Prescriptions on File Prior to Visit  Medication Sig Dispense Refill  . aspirin  81 MG tablet Take 81 mg by mouth daily.    . cholecalciferol (VITAMIN D) 1000 UNITS tablet Take 2,000 Units by mouth daily.    . Cyanocobalamin (RA VITAMIN B-12 TR) 1000 MCG TBCR Take by mouth every other day.     . esomeprazole (NEXIUM) 40 MG capsule Take 1 capsule (40 mg total) by mouth 2 (two) times daily before a meal. 60 capsule 2  . fluticasone (FLONASE) 50 MCG/ACT nasal spray     . loratadine (CLARITIN) 10 MG tablet Take 1 tablet (10 mg total) by mouth daily. 30 tablet 11  . Omega-3 Fatty Acids (FISH OIL PO) Take by mouth.    . polyethylene glycol (MIRALAX / GLYCOLAX) packet Take 17 g by mouth daily as needed.     . ranitidine (ZANTAC) 150 MG tablet Take 1 tablet (150 mg total) by mouth at bedtime. 90 tablet 1  . tiZANidine (ZANAFLEX) 2 MG tablet Take 1 tablet (2 mg total) by mouth at bedtime as needed for muscle spasms. 20 tablet 3   No current facility-administered medications on file prior to visit.     Past Medical History:  Diagnosis Date  . breast ca 1978  . GERD (gastroesophageal reflux disease)   . Hyperlipidemia   . IBS (irritable bowel syndrome)   . Osteoporosis    T8 compression fx 03/2015   .  Vitamin B12 deficiency     Past Surgical History:  Procedure Laterality Date  . arm surgery     left  . [place]   With subsequent correction of surgery  . KNEE SURGERY     left  . Left Breast Implant  1981  . Left Mastectomy  1978  . NECK SURGERY  2008   c5-6  . TUBAL LIGATION      Social History   Social History  . Marital status: Married    Spouse name: N/A  . Number of children: N/A  . Years of education: N/A   Social History Main Topics  . Smoking status: Never Smoker  . Smokeless tobacco: Never Used  . Alcohol use No  . Drug use: No  . Sexual activity: Not Asked   Other Topics Concern  . None   Social History Narrative   Housewife, Lives with spouse, 2 children    Family History  Problem Relation Age of Onset  . Diabetes  Sister   . Diabetes Brother   . Breast cancer Maternal Aunt   . Colon cancer Neg Hx     Review of Systems  Constitutional: Negative for fever.  Musculoskeletal: Positive for arthralgias, neck pain and neck stiffness.  Neurological: Positive for weakness, numbness and headaches.       Objective:   Vitals:   03/14/16 2136  BP: 124/82  Pulse: 70   Filed Weights   03/14/16 2136  Weight: 165 lb (74.8 kg)   Body mass index is 30.18 kg/m.   Physical Exam     Constitutional: Appears well-developed and well-nourished. No distress.  HENT:  Head: Normocephalic and atraumatic.  Musculoskeletal: No edema. Dec ROM of right shoulder especially with internal and external rotation, tenderness right shoulder and upper arm; posterior neck tenderness with palpation at base of skull Lymphadenopathy: No cervical adenopathy.  Skin: Skin is warm and dry. Not diaphoretic.  Psychiatric: Normal mood and affect. Behavior is normal.     Assessment & Plan:    See Problem List for Assessment and Plan of chronic medical problems.

## 2016-03-14 NOTE — Assessment & Plan Note (Signed)
Check tsh - it was normal earlier this year Also difficulty losing weight - likely not related to thyroid

## 2016-03-14 NOTE — Assessment & Plan Note (Signed)
Chronic Has seen dr Durene Cal - prior surgery ok, no imaging needed Dr Posey Pronto prescribed tizanidine - she will try it Tolerated gabapentin 100 mg but did not tolerate 200 mg at night -- will retry 100 mg at night to see if that helps

## 2016-03-17 ENCOUNTER — Other Ambulatory Visit (INDEPENDENT_AMBULATORY_CARE_PROVIDER_SITE_OTHER): Payer: Medicare Other

## 2016-03-17 DIAGNOSIS — E538 Deficiency of other specified B group vitamins: Secondary | ICD-10-CM | POA: Diagnosis not present

## 2016-03-17 DIAGNOSIS — E785 Hyperlipidemia, unspecified: Secondary | ICD-10-CM | POA: Diagnosis not present

## 2016-03-17 DIAGNOSIS — R7303 Prediabetes: Secondary | ICD-10-CM | POA: Diagnosis not present

## 2016-03-17 DIAGNOSIS — E042 Nontoxic multinodular goiter: Secondary | ICD-10-CM

## 2016-03-17 LAB — VITAMIN B12: VITAMIN B 12: 662 pg/mL (ref 211–911)

## 2016-03-17 LAB — CBC WITH DIFFERENTIAL/PLATELET
BASOS ABS: 0 10*3/uL (ref 0.0–0.1)
Basophils Relative: 0.8 % (ref 0.0–3.0)
Eosinophils Absolute: 0.2 10*3/uL (ref 0.0–0.7)
Eosinophils Relative: 4.6 % (ref 0.0–5.0)
HEMATOCRIT: 36 % (ref 36.0–46.0)
Hemoglobin: 12.3 g/dL (ref 12.0–15.0)
LYMPHS PCT: 32.2 % (ref 12.0–46.0)
Lymphs Abs: 1.7 10*3/uL (ref 0.7–4.0)
MCHC: 34 g/dL (ref 30.0–36.0)
MCV: 94.5 fl (ref 78.0–100.0)
MONOS PCT: 9.2 % (ref 3.0–12.0)
Monocytes Absolute: 0.5 10*3/uL (ref 0.1–1.0)
NEUTROS ABS: 2.8 10*3/uL (ref 1.4–7.7)
Neutrophils Relative %: 53.2 % (ref 43.0–77.0)
PLATELETS: 308 10*3/uL (ref 150.0–400.0)
RBC: 3.81 Mil/uL — AB (ref 3.87–5.11)
RDW: 13.5 % (ref 11.5–15.5)
WBC: 5.3 10*3/uL (ref 4.0–10.5)

## 2016-03-17 LAB — COMPREHENSIVE METABOLIC PANEL
ALT: 8 U/L (ref 0–35)
AST: 11 U/L (ref 0–37)
Albumin: 4.1 g/dL (ref 3.5–5.2)
Alkaline Phosphatase: 40 U/L (ref 39–117)
BUN: 18 mg/dL (ref 6–23)
CO2: 27 meq/L (ref 19–32)
Calcium: 9 mg/dL (ref 8.4–10.5)
Chloride: 104 mEq/L (ref 96–112)
Creatinine, Ser: 0.95 mg/dL (ref 0.40–1.20)
GFR: 60.33 mL/min (ref >60.00)
GLUCOSE: 104 mg/dL — AB (ref 70–99)
POTASSIUM: 4.5 meq/L (ref 3.5–5.1)
Sodium: 139 mEq/L (ref 135–145)
Total Bilirubin: 0.4 mg/dL (ref 0.2–1.2)
Total Protein: 6.8 g/dL (ref 6.0–8.3)

## 2016-03-17 LAB — LIPID PANEL
CHOLESTEROL: 257 mg/dL — AB (ref 0–200)
HDL: 50.7 mg/dL (ref >39.00)
LDL Cholesterol: 181 mg/dL — ABNORMAL HIGH (ref 0–99)
NonHDL: 206.36
TRIGLYCERIDES: 126 mg/dL (ref 0.0–149.0)
Total CHOL/HDL Ratio: 5
VLDL: 25.2 mg/dL (ref 0.0–40.0)

## 2016-03-17 LAB — TSH: TSH: 2.54 u[IU]/mL (ref 0.35–4.50)

## 2016-03-17 LAB — HEMOGLOBIN A1C: Hgb A1c MFr Bld: 6.1 % (ref 4.6–6.5)

## 2016-03-19 ENCOUNTER — Encounter: Payer: Self-pay | Admitting: Emergency Medicine

## 2016-03-19 DIAGNOSIS — M542 Cervicalgia: Secondary | ICD-10-CM | POA: Diagnosis not present

## 2016-03-19 DIAGNOSIS — M79602 Pain in left arm: Secondary | ICD-10-CM | POA: Diagnosis not present

## 2016-03-19 DIAGNOSIS — M546 Pain in thoracic spine: Secondary | ICD-10-CM | POA: Diagnosis not present

## 2016-03-19 DIAGNOSIS — M79601 Pain in right arm: Secondary | ICD-10-CM | POA: Diagnosis not present

## 2016-03-19 DIAGNOSIS — R531 Weakness: Secondary | ICD-10-CM | POA: Diagnosis not present

## 2016-03-20 ENCOUNTER — Ambulatory Visit
Admission: RE | Admit: 2016-03-20 | Discharge: 2016-03-20 | Disposition: A | Discharge status: Home / Self Care | Payer: Medicare Other | Source: Ambulatory Visit | Attending: Neurology | Admitting: Neurology

## 2016-03-20 DIAGNOSIS — R6889 Other general symptoms and signs: Principal | ICD-10-CM

## 2016-03-20 DIAGNOSIS — M542 Cervicalgia: Secondary | ICD-10-CM

## 2016-03-20 DIAGNOSIS — IMO0001 Reserved for inherently not codable concepts without codable children: Secondary | ICD-10-CM

## 2016-03-20 DIAGNOSIS — R531 Weakness: Secondary | ICD-10-CM

## 2016-03-20 DIAGNOSIS — R270 Ataxia, unspecified: Secondary | ICD-10-CM | POA: Diagnosis not present

## 2016-03-20 MED ORDER — GADOBENATE DIMEGLUMINE 529 MG/ML IV SOLN: 15.0000 mL | Freq: Once | INTRAVENOUS | Status: DC | PRN

## 2016-03-24 ENCOUNTER — Ambulatory Visit (INDEPENDENT_AMBULATORY_CARE_PROVIDER_SITE_OTHER): Payer: Medicare Other | Admitting: Neurology

## 2016-03-24 DIAGNOSIS — IMO0001 Reserved for inherently not codable concepts without codable children: Secondary | ICD-10-CM

## 2016-03-24 DIAGNOSIS — R6889 Other general symptoms and signs: Principal | ICD-10-CM

## 2016-03-25 DIAGNOSIS — R6889 Other general symptoms and signs: Secondary | ICD-10-CM

## 2016-03-27 ENCOUNTER — Other Ambulatory Visit: Payer: Self-pay

## 2016-04-02 DIAGNOSIS — H9313 Tinnitus, bilateral: Secondary | ICD-10-CM | POA: Diagnosis not present

## 2016-04-02 DIAGNOSIS — K219 Gastro-esophageal reflux disease without esophagitis: Secondary | ICD-10-CM | POA: Diagnosis not present

## 2016-04-02 DIAGNOSIS — H9193 Unspecified hearing loss, bilateral: Secondary | ICD-10-CM | POA: Diagnosis not present

## 2016-04-02 DIAGNOSIS — H903 Sensorineural hearing loss, bilateral: Secondary | ICD-10-CM | POA: Diagnosis not present

## 2016-04-02 DIAGNOSIS — J310 Chronic rhinitis: Secondary | ICD-10-CM | POA: Diagnosis not present

## 2016-04-07 ENCOUNTER — Telehealth: Payer: Self-pay | Admitting: Neurology

## 2016-04-07 ENCOUNTER — Other Ambulatory Visit: Payer: Medicare Other

## 2016-04-07 NOTE — Telephone Encounter (Signed)
Patient said that she had 48 hour EEG and is still having spells.  Please advise.

## 2016-04-07 NOTE — Telephone Encounter (Signed)
Looks like you had left a message with her husband that her MRI brain was normal and did not show anything worrisome.  EEG was also normal.  If she is having spells, proceed with ambulatory 24 hour EEG.  Donika K. Posey Pronto, DO

## 2016-04-07 NOTE — Telephone Encounter (Signed)
PT called and wanted to know if her test results were ready/Dawn CB# [phone]

## 2016-04-07 NOTE — Telephone Encounter (Signed)
Please advise 

## 2016-04-08 ENCOUNTER — Other Ambulatory Visit: Payer: Self-pay | Admitting: *Deleted

## 2016-04-08 DIAGNOSIS — H1132 Conjunctival hemorrhage, left eye: Secondary | ICD-10-CM | POA: Diagnosis not present

## 2016-04-08 DIAGNOSIS — R42 Dizziness and giddiness: Secondary | ICD-10-CM

## 2016-04-08 DIAGNOSIS — IMO0001 Reserved for inherently not codable concepts without codable children: Secondary | ICD-10-CM

## 2016-04-08 DIAGNOSIS — R6889 Other general symptoms and signs: Principal | ICD-10-CM

## 2016-04-08 NOTE — Telephone Encounter (Signed)
Patient notified and referral sent.

## 2016-04-08 NOTE — Procedures (Signed)
ELECTROENCEPHALOGRAM REPORT  Dates of Recording: 03/24/2016 to 03/26/2016  Patient's Name: [name] MRN: [id number] Date of Birth: [birth year]  Referring Provider: Dr. Narda Amber  Procedure: 48-hour ambulatory EEG  History: This is a 81 year old woman with recurrent episodes of a "dazed feeling," unresponsiveness, neck pain/heaviness, and leg discomfort/generalized weakness.   Medications: ASA, VITAMIN D, CYMBALTA, NEXIUM, FLONASE, CLARITIN, FISH OIL PO, (MIRALAX / GLYCOLAX, ZANTAC, [place], [place]  Technical Summary: This is a 48-hour multichannel digital EEG recording measured by the international 10-20 system with electrodes applied with paste and impedances below 5000 ohms performed as portable with EKG monitoring.  The digital EEG was referentially recorded, reformatted, and digitally filtered in a variety of bipolar and referential montages for optimal display.    DESCRIPTION OF RECORDING: During maximal wakefulness, the background activity consisted of a symmetric 10 Hz posterior dominant rhythm which was reactive to eye opening. There is occasional independent focal slowing seen over the bilateral temporal regions, at times sharply contoured without clear epileptogenic potential. There were no clear epileptiform discharges seen in wakefulness.  During the recording, the patient progresses through wakefulness, drowsiness, and Stage 2 sleep.  Similar occasional independent focal slowing is seen over the bilateral temporal regions, at times sharply contoured without clear epileptogenic potential. Again, there were no epileptiform discharges seen.  Events: On 08/28 at 1540 hours, she reports a small wave while talking. Electrographically, there were no EEG or EKG changes seen.  On 08/28 at 1610 hours, she reports feeling short of breath, legs heavy, gave out walking, had to rest 4 times. Electrographically, there were no EEG or EKG changes seen.  There were no  electrographic seizures seen.  EKG lead was unremarkable.  IMPRESSION: This 48-hour ambulatory EEG study is abnormal due to occasional independent focal slowing seen over the bilateral temporal regions. Episode of "small wave" and shortness of breath, leg heaviness did not show any electrographic correlate.  CLINICAL CORRELATION of the above findings indicates focal cerebral dysfunction over the bilateral temporal regions suggestive of underlying structural or physiologic abnormality. The absence of epileptiform discharges does not exclude a clinical diagnosis of epilepsy. Episodes of "small wave" and shortness of breath/leg heaviness did not show electrographic correlate, indicating these are non-epileptic. If further clinical questions remain, inpatient video EEG monitoring may be helpful.   Ellouise Newer, M.D.

## 2016-04-08 NOTE — Telephone Encounter (Signed)
The results of her extended EEG did not show these spells correlated with any abnormal brain activity and they are not seizures. Because her neurologic testing is returning normal, we can order 48 hour Holter testing to see if they could be due to any cardiac arrhythmias.

## 2016-04-15 ENCOUNTER — Telehealth: Payer: Self-pay | Admitting: *Deleted

## 2016-04-15 NOTE — Telephone Encounter (Signed)
Yes, agree with holding gabapentin and seeing what neuro thinks.

## 2016-04-15 NOTE — Telephone Encounter (Signed)
Rec'd call pt states at her last visit MD stated she can go back on the Gabapentin, she took one sun & mon but last night she had episode of shallow breathing, nad she couldn't hardly talk. She read that shallow breathing was one of the side efffect and to let her doctor know. She states she will not take anymore, and should she see what the neurologist say first before taking anything else...Johny Chess

## 2016-04-16 NOTE — Telephone Encounter (Signed)
Notified pt w/MD response.../lmb 

## 2016-04-19 MED ORDER — GADOBENATE DIMEGLUMINE 529 MG/ML IV SOLN
15.0000 mL | Freq: Once | INTRAVENOUS | Status: AC | PRN
  Administered 2016-03-20: 15 mL via INTRAVENOUS

## 2016-04-19 NOTE — Addendum Note (Signed)
Encounter addended by: Romilda Garret, RT on: 04/19/2016  8:38 PM<BR>    Actions taken: Imaging Exam ended, Order Entry activity accessed, [name] administration accepted

## 2016-04-22 DIAGNOSIS — M7581 Other shoulder lesions, right shoulder: Secondary | ICD-10-CM | POA: Diagnosis not present

## 2016-04-22 DIAGNOSIS — M542 Cervicalgia: Secondary | ICD-10-CM | POA: Diagnosis not present

## 2016-04-24 ENCOUNTER — Encounter: Payer: Self-pay | Admitting: *Deleted

## 2016-04-24 ENCOUNTER — Ambulatory Visit (INDEPENDENT_AMBULATORY_CARE_PROVIDER_SITE_OTHER): Payer: Medicare Other

## 2016-04-24 DIAGNOSIS — R6889 Other general symptoms and signs: Secondary | ICD-10-CM | POA: Diagnosis not present

## 2016-04-24 DIAGNOSIS — IMO0001 Reserved for inherently not codable concepts without codable children: Secondary | ICD-10-CM

## 2016-04-24 DIAGNOSIS — R42 Dizziness and giddiness: Secondary | ICD-10-CM | POA: Diagnosis not present

## 2016-04-24 NOTE — Progress Notes (Signed)
Patient ID: [name], female   DOB: 1935/04/25, 81 y.o.   MRN: [id number] Patient arrived for appointment to have a 48 hour holter monitor applied.  She complaint of feeling off balance with a heavy weak feeling in her legs and short winded.  HR 63 bpm, SpO2 97, B/P  204/94.  Patient states blood pressure usually runs 120-130/68.  Blood pressure rechecked prior to departure, 187/77.  Patient instructed to follow up with PCP.

## 2016-05-02 ENCOUNTER — Encounter: Payer: Self-pay | Admitting: Internal Medicine

## 2016-05-02 ENCOUNTER — Ambulatory Visit (INDEPENDENT_AMBULATORY_CARE_PROVIDER_SITE_OTHER): Payer: Medicare Other | Admitting: Internal Medicine

## 2016-05-02 ENCOUNTER — Telehealth: Payer: Self-pay | Admitting: Neurology

## 2016-05-02 VITALS — BP 128/60 | HR 71 | Ht 62.0 in | Wt 163.2 lb

## 2016-05-02 DIAGNOSIS — I495 Sick sinus syndrome: Secondary | ICD-10-CM | POA: Diagnosis not present

## 2016-05-02 LAB — CBC WITH DIFFERENTIAL/PLATELET
BASOS ABS: 78 {cells}/uL (ref 0–200)
Basophils Relative: 1 %
EOS ABS: 234 {cells}/uL (ref 15–500)
Eosinophils Relative: 3 %
HEMATOCRIT: 38 % (ref 35.0–45.0)
Hemoglobin: 12.9 g/dL (ref 11.7–15.5)
LYMPHS PCT: 29 %
Lymphs Abs: 2262 cells/uL (ref 850–3900)
MCH: 31.9 pg (ref 27.0–33.0)
MCHC: 33.9 g/dL (ref 32.0–36.0)
MCV: 94.1 fL (ref 80.0–100.0)
MONO ABS: 858 {cells}/uL (ref 200–950)
MONOS PCT: 11 %
MPV: 11.3 fL (ref 7.5–12.5)
Neutro Abs: 4368 cells/uL (ref 1500–7800)
Neutrophils Relative %: 56 %
PLATELETS: 363 10*3/uL (ref 140–400)
RBC: 4.04 MIL/uL (ref 3.80–5.10)
RDW: 13.6 % (ref 11.0–15.0)
WBC: 7.8 10*3/uL (ref 3.8–10.8)

## 2016-05-02 LAB — BASIC METABOLIC PANEL
BUN: 22 mg/dL (ref 7–25)
CALCIUM: 9.4 mg/dL (ref 8.6–10.4)
CO2: 25 mmol/L (ref 20–31)
CREATININE: 0.93 mg/dL (ref 0.60–0.93)
Chloride: 103 mmol/L (ref 98–110)
GLUCOSE: 102 mg/dL — AB (ref 65–99)
Potassium: 4.7 mmol/L (ref 3.5–5.3)
Sodium: 137 mmol/L (ref 135–146)

## 2016-05-02 NOTE — Patient Instructions (Addendum)
Medication Instructions:  Your physician recommends that you continue on your current medications as directed. Please refer to the Current Medication list given to you today.   Labwork: TODAY: BMET/CBC    Testing/Procedures: Your physician has recommended that you have a pacemaker inserted. A pacemaker is a small device that is placed under the skin of your chest or abdomen to help control abnormal heart rhythms. This device uses electrical pulses to prompt the heart to beat at a normal rate. Pacemakers are used to treat heart rhythms that are too slow. Wire (leads) are attached to the pacemaker that goes into the chambers of you heart. This is done in the hospital and usually requires and overnight stay. Please see the instruction sheet given to you today for more information.     Follow-Up: Follow-up for a wound check in the Device Clinic 10-14 days 05/05/16 and with Dr.  Lovena Le 91 days from 05/05/16.   Any Other Special Instructions Will Be Listed Below ---  Please wash with the CHG Soap the night before and morning of procedure (follow instruction page "Preparing For Surgery").   Report to the [place] [facility] at 05/05/16 at 5:30 AM  Nothing to eat or drink after midnight the night before procedure  Day of procedure you may take your medications with a sip of water  Plan 1 night stay    If you need a refill on your cardiac medications before your next appointment, please call your pharmacy.

## 2016-05-02 NOTE — Progress Notes (Signed)
HPI [name] is referred today for evaluation of symptomatic sinus pauses. She is a pleasant elderly woman with a h/o Breast CA, s/p mastectomy, who has undergone reconstruction. The patient has had a year of spells. No frank syncope but she will get dizzy and she states that it feels like everything comes to a halt. Her vision and hearing start to go away and she feels dizzy and has to hold onto something to keep from passing out. She has worn a cardiac monitor which demonstrated both daytime and nightime pauses of up to 7 seconds. She has not passed out or injured herself.   Allergies  Allergen Reactions  . Lipitor [Atorvastatin]     Lipitor caused hospitalization - depletion of electrolytes, fever, nausea, loss of appetite, couldn't get out of bed  . Statins     Lipitor caused hospitalization - - depletion of electrolytes, fever, nausea, loss of appetite, couldn't get out of bed  . Cymbalta [Duloxetine Hcl]     Dulled her too much, difficulty urinating, change in vision  . Fosamax [Alendronate Sodium]     Caused chronic issues swallowing  . Gabapentin Other (See Comments)    Dizziness and sedation  . Lyrica [Pregabalin]     Unknown     Current Outpatient Prescriptions  Medication Sig Dispense Refill  . aspirin 81 MG tablet Take 81 mg by mouth daily.    . Calcium Citrate-Vitamin D (CITRACAL/VITAMIN D PO) Take 1 tablet by mouth daily.    . cholecalciferol (VITAMIN D) 1000 UNITS tablet Take 2,000 Units by mouth daily.    . Cyanocobalamin (RA VITAMIN B-12 TR) 1000 MCG TBCR Take 1 tablet by mouth every other day.     . esomeprazole (NEXIUM) 40 MG capsule Take 1 capsule (40 mg total) by mouth 2 (two) times daily before a meal. 60 capsule 2  . fluticasone (FLONASE) 50 MCG/ACT nasal spray as directed.     . loratadine (CLARITIN) 10 MG tablet Take 10 mg by mouth daily as needed for allergies.    . polyethylene glycol (MIRALAX / GLYCOLAX) packet Take 17 g by mouth daily as needed  (constipation).     . ranitidine (ZANTAC) 150 MG tablet Take 150 mg by mouth at bedtime as needed for heartburn.    [name] tiZANidine (ZANAFLEX) 2 MG tablet Take 1 tablet (2 mg total) by mouth at bedtime as needed for muscle spasms. (Patient not taking: Reported on 05/02/2016) 20 tablet 3   No current facility-administered medications for this visit.      Past Medical History:  Diagnosis Date  . breast ca 1978  . GERD (gastroesophageal reflux disease)   . Hyperlipidemia   . IBS (irritable bowel syndrome)   . Osteoporosis    T8 compression fx 03/2015   . Vitamin B12 deficiency     ROS:   All systems reviewed and negative except as noted in the HPI.   Past Surgical History:  Procedure Laterality Date  . arm surgery     left  . [place]   With subsequent correction of surgery  . KNEE SURGERY     left  . Left Breast Implant  1981  . Left Mastectomy  1978  . NECK SURGERY  2008   c5-6  . TUBAL LIGATION       Family History  Problem Relation Age of Onset  . Diabetes Sister   . Diabetes Brother   . Breast cancer Maternal Aunt   .  Colon cancer Neg Hx      Social History   Social History  . Marital status: Married    Spouse name: N/A  . Number of children: N/A  . Years of education: N/A   Occupational History  . Not on file.   Social History Main Topics  . Smoking status: Never Smoker  . Smokeless tobacco: Never Used  . Alcohol use No  . Drug use: No  . Sexual activity: Not on file   Other Topics Concern  . Not on file   Social History Narrative   Housewife, Lives with spouse, 2 children     BP 128/60   Pulse 71   Ht 5\' 2"  (1.575 m)   Wt 163 lb 3.2 oz (74 kg)   BMI 29.85 kg/m   Physical Exam:  Well appearing NAD HEENT: Unremarkable Neck:  No JVD, no thyromegally Lymphatics:  No adenopathy Back:  No CVA tenderness Lungs:  Clear HEART:  Regular rate rhythm, no murmurs, no rubs, no clicks Abd:  soft, positive bowel sounds, no  organomegally, no rebound, no guarding Ext:  2 plus pulses, no edema, no cyanosis, no clubbing Skin:  No rashes no nodules Neuro:  CN II through XII intact, motor grossly intact  EKG - NSR with poor r wave progression  DEVICE  Normal device function.  See PaceArt for details.   Assess/Plan: 1. Sinus node dysfunction - I have discussed the problem and solution with the patient and her family and they wish to proceed with PPM insertion.  2. Dyslipidemia - she will need treatment for this in the future. 3. Dizzy spells - these are related most likely to her long pauses. Hopefully they will improve after PPM is inserted.   Mikle Bosworth.D.

## 2016-05-02 NOTE — Telephone Encounter (Signed)
Called and discussed results of Holter with patient which shows long sinus pauses and sinus brady, which most likely explains her clinical symptoms.  She reports that she is gradually feeling worse and having more frequent spells of fatigue, dazed spells, and leg weakness.   Cardiology has kindly arranged for an urgent out-patient visit with Dr. Lovena Le at 3:30pm today.  I informed the patient that because she is symptomatic and feels worse, she should go directly to the ER for evaluation and can be seen by cardiology at the hospital.    I very much appreciate the prompt coordination of care by cardiology.  [name] K. Posey Pronto, DO

## 2016-05-05 ENCOUNTER — Ambulatory Visit (HOSPITAL_COMMUNITY)
Admission: RE | Admit: 2016-05-05 | Discharge: 2016-05-06 | Disposition: A | Discharge status: Home / Self Care | Payer: Medicare Other | Source: Ambulatory Visit | Attending: Internal Medicine | Admitting: Internal Medicine

## 2016-05-05 ENCOUNTER — Encounter (HOSPITAL_COMMUNITY): Payer: Self-pay | Admitting: Internal Medicine

## 2016-05-05 ENCOUNTER — Encounter (HOSPITAL_COMMUNITY)
Admission: RE | Disposition: A | Discharge status: Home / Self Care | Payer: Self-pay | Source: Ambulatory Visit | Attending: Internal Medicine

## 2016-05-05 DIAGNOSIS — K219 Gastro-esophageal reflux disease without esophagitis: Secondary | ICD-10-CM | POA: Diagnosis not present

## 2016-05-05 DIAGNOSIS — Z833 Family history of diabetes mellitus: Secondary | ICD-10-CM | POA: Diagnosis not present

## 2016-05-05 DIAGNOSIS — I495 Sick sinus syndrome: Secondary | ICD-10-CM | POA: Diagnosis not present

## 2016-05-05 DIAGNOSIS — Z853 Personal history of malignant neoplasm of breast: Secondary | ICD-10-CM | POA: Insufficient documentation

## 2016-05-05 DIAGNOSIS — Z7982 Long term (current) use of aspirin: Secondary | ICD-10-CM | POA: Insufficient documentation

## 2016-05-05 DIAGNOSIS — E785 Hyperlipidemia, unspecified: Secondary | ICD-10-CM | POA: Insufficient documentation

## 2016-05-05 DIAGNOSIS — Z803 Family history of malignant neoplasm of breast: Secondary | ICD-10-CM | POA: Diagnosis not present

## 2016-05-05 DIAGNOSIS — M810 Age-related osteoporosis without current pathological fracture: Secondary | ICD-10-CM | POA: Diagnosis not present

## 2016-05-05 DIAGNOSIS — E538 Deficiency of other specified B group vitamins: Secondary | ICD-10-CM | POA: Diagnosis not present

## 2016-05-05 DIAGNOSIS — Z950 Presence of cardiac pacemaker: Secondary | ICD-10-CM

## 2016-05-05 DIAGNOSIS — Z800 Family history of malignant neoplasm of digestive organs: Secondary | ICD-10-CM | POA: Insufficient documentation

## 2016-05-05 DIAGNOSIS — K589 Irritable bowel syndrome without diarrhea: Secondary | ICD-10-CM | POA: Diagnosis not present

## 2016-05-05 HISTORY — DX: Personal history of other diseases of the digestive system: Z87.19

## 2016-05-05 HISTORY — DX: Cardiac murmur, unspecified: R01.1

## 2016-05-05 HISTORY — DX: Pain in thoracic spine: M54.6

## 2016-05-05 HISTORY — DX: Other chronic pain: G89.29

## 2016-05-05 HISTORY — DX: Presence of cardiac pacemaker: Z95.0

## 2016-05-05 HISTORY — DX: Age-related osteoporosis without current pathological fracture: M81.0

## 2016-05-05 HISTORY — PX: EP IMPLANTABLE DEVICE: SHX172B

## 2016-05-05 HISTORY — DX: Malignant neoplasm of unspecified site of left female breast: C50.912

## 2016-05-05 HISTORY — DX: Nontoxic multinodular goiter: E04.2

## 2016-05-05 LAB — SURGICAL PCR SCREEN
MRSA, PCR: NEGATIVE
STAPHYLOCOCCUS AUREUS: NEGATIVE

## 2016-05-05 SURGERY — PACEMAKER IMPLANT
Anesthesia: LOCAL

## 2016-05-05 MED ORDER — POLYETHYLENE GLYCOL 3350 17 G PO PACK: 17.0000 g | PACK | Freq: Every day | ORAL | Status: DC | PRN

## 2016-05-05 MED ORDER — LIDOCAINE HCL (PF) 1 % IJ SOLN
INTRAMUSCULAR | Status: AC
  Filled 2016-05-05: qty 60

## 2016-05-05 MED ORDER — ACETAMINOPHEN 325 MG PO TABS: 325.0000 mg | ORAL_TABLET | ORAL | Status: DC | PRN

## 2016-05-05 MED ORDER — FENTANYL CITRATE (PF) 100 MCG/2ML IJ SOLN
INTRAMUSCULAR | Status: AC
  Filled 2016-05-05: qty 2

## 2016-05-05 MED ORDER — ASPIRIN 81 MG PO CHEW
81.0000 mg | CHEWABLE_TABLET | Freq: Every day | ORAL | Status: DC
Start: 2016-05-05 — End: 2016-05-06
  Administered 2016-05-05: 81 mg via ORAL
  Filled 2016-05-05 (×2): qty 1

## 2016-05-05 MED ORDER — LIDOCAINE HCL (PF) 1 % IJ SOLN
INTRAMUSCULAR | Status: DC | PRN
  Administered 2016-05-05: 60 mL via INTRADERMAL

## 2016-05-05 MED ORDER — MUPIROCIN 2 % EX OINT
1.0000 "application " | TOPICAL_OINTMENT | Freq: Once | CUTANEOUS | Status: DC
  Filled 2016-05-05: qty 22

## 2016-05-05 MED ORDER — MIDAZOLAM HCL 5 MG/5ML IJ SOLN
INTRAMUSCULAR | Status: AC
  Filled 2016-05-05: qty 5

## 2016-05-05 MED ORDER — CEFAZOLIN SODIUM-DEXTROSE 2-4 GM/100ML-% IV SOLN
INTRAVENOUS | Status: AC
  Filled 2016-05-05: qty 100

## 2016-05-05 MED ORDER — SODIUM CHLORIDE 0.9 % IV SOLN: INTRAVENOUS | Status: DC

## 2016-05-05 MED ORDER — SODIUM CHLORIDE 0.9 % IR SOLN
80.0000 mg | Status: AC
  Administered 2016-05-05: 80 mg
  Filled 2016-05-05: qty 2

## 2016-05-05 MED ORDER — ONDANSETRON HCL 4 MG/2ML IJ SOLN: 4.0000 mg | Freq: Four times a day (QID) | INTRAMUSCULAR | Status: DC | PRN

## 2016-05-05 MED ORDER — HEPARIN (PORCINE) IN NACL 2-0.9 UNIT/ML-% IJ SOLN
INTRAMUSCULAR | Status: AC
  Filled 2016-05-05: qty 500

## 2016-05-05 MED ORDER — FENTANYL CITRATE (PF) 100 MCG/2ML IJ SOLN
INTRAMUSCULAR | Status: DC | PRN
  Administered 2016-05-05 (×7): 12.5 ug via INTRAVENOUS

## 2016-05-05 MED ORDER — HEPARIN (PORCINE) IN NACL 2-0.9 UNIT/ML-% IJ SOLN
INTRAMUSCULAR | Status: DC | PRN
  Administered 2016-05-05: 08:00:00

## 2016-05-05 MED ORDER — CEFAZOLIN IN D5W 1 GM/50ML IV SOLN
1.0000 g | Freq: Four times a day (QID) | INTRAVENOUS | Status: AC
  Administered 2016-05-05 – 2016-05-06 (×3): 1 g via INTRAVENOUS
  Filled 2016-05-05 (×3): qty 50

## 2016-05-05 MED ORDER — VITAMIN D3 25 MCG (1000 UNIT) PO TABS
1000.0000 [IU] | ORAL_TABLET | Freq: Every day | ORAL | Status: DC
  Administered 2016-05-05: 1000 [IU] via ORAL
  Filled 2016-05-05 (×4): qty 1

## 2016-05-05 MED ORDER — CEFAZOLIN SODIUM-DEXTROSE 2-4 GM/100ML-% IV SOLN
2.0000 g | INTRAVENOUS | Status: AC
  Administered 2016-05-05: 2 g via INTRAVENOUS
  Filled 2016-05-05: qty 100

## 2016-05-05 MED ORDER — SODIUM CHLORIDE 0.9 % IR SOLN
Status: AC
  Filled 2016-05-05: qty 2

## 2016-05-05 MED ORDER — LORATADINE 10 MG PO TABS: 10.0000 mg | ORAL_TABLET | Freq: Every day | ORAL | Status: DC | PRN

## 2016-05-05 MED ORDER — MIDAZOLAM HCL 5 MG/5ML IJ SOLN
INTRAMUSCULAR | Status: DC | PRN
  Administered 2016-05-05 (×7): 1 mg via INTRAVENOUS

## 2016-05-05 MED ORDER — VITAMIN B-12 1000 MCG PO TABS
1000.0000 ug | ORAL_TABLET | ORAL | Status: DC
  Filled 2016-05-05: qty 1

## 2016-05-05 MED ORDER — CHLORHEXIDINE GLUCONATE 4 % EX LIQD
60.0000 mL | Freq: Once | CUTANEOUS | Status: DC
  Filled 2016-05-05: qty 60

## 2016-05-05 MED ORDER — MUPIROCIN 2 % EX OINT
TOPICAL_OINTMENT | CUTANEOUS | Status: AC
  Administered 2016-05-05: 1
  Filled 2016-05-05: qty 22

## 2016-05-05 MED ORDER — SODIUM CHLORIDE 0.9 % IV SOLN
INTRAVENOUS | Status: DC
  Administered 2016-05-05: 07:00:00 via INTRAVENOUS

## 2016-05-05 SURGICAL SUPPLY — 9 items
CABLE SURGICAL S-101-97-12 (CABLE) ×2 IMPLANT
INGEVITY MRI 7741-52CM (Lead) ×2 IMPLANT
LEAD CAPSURE NOVUS 45CM (Lead) ×2 IMPLANT
LEAD PACING INGEVITY MRI 52CM (Lead) ×1 IMPLANT
PACEMAKER ACCOLADE DR-EL (Pacemaker) ×2 IMPLANT
PAD DEFIB LIFELINK (PAD) ×2 IMPLANT
SHEATH CLASSIC 7F (SHEATH) ×6 IMPLANT
TRAY PACEMAKER INSERTION (PACKS) ×2 IMPLANT
WIRE HI TORQ VERSACORE-J 145CM (WIRE) ×2 IMPLANT

## 2016-05-05 NOTE — Interval H&P Note (Signed)
History and Physical Interval Note:  05/05/2016 7:11 AM  [name]  has presented today for surgery, with the diagnosis of sinus node dysfunction  The various methods of treatment have been discussed with the patient and family. After consideration of risks, benefits and other options for treatment, the patient has consented to  Procedure(s): Pacemaker Implant (N/A) as a surgical intervention .  The patient's history has been reviewed, patient examined, no change in status, stable for surgery.  I have reviewed the patient's chart and labs.  Questions were answered to the patient's satisfaction.     Cristopher Peru

## 2016-05-05 NOTE — Discharge Summary (Signed)
ELECTROPHYSIOLOGY PROCEDURE DISCHARGE SUMMARY    Patient ID: [name],  MRN: [id number], DOB/AGE: [birth year] 81 y.o.  Admit date: 05/05/2016 Discharge date: 05/06/16  Primary Care Physician: [name], MD  Primary Cardiologist/Electrophysiologist: Dr. Lovena Le  Primary Discharge Diagnosis:  1. Sinus node dysfunction     S/p PPM implantation this admission    Allergies  Allergen Reactions  . Lipitor [Atorvastatin]     Lipitor caused hospitalization - depletion of electrolytes, fever, nausea, loss of appetite, couldn't get out of bed  . Statins     Lipitor caused hospitalization - - depletion of electrolytes, fever, nausea, loss of appetite, couldn't get out of bed  . Cymbalta [Duloxetine Hcl]     Dulled her too much, difficulty urinating, change in vision  . Fosamax [Alendronate Sodium]     Caused chronic issues swallowing  . Gabapentin Other (See Comments)    Dizziness and sedation  . Lyrica [Pregabalin]     Unknown  . Tizanidine Other (See Comments)    Could not sleep     Procedures This Admission:  1.  Implantation of a BSCi dual chamber PPM on 05/05/16 by Dr Lovena Le.  The patient received a [employer] (serial number [id number]) pacemaker,medtronic (serial number [id number]) right atrial lead and a [employer] (serial number [id number]) right ventricular lead. There were no immediate post procedure complications. 2.  CXR on 05/06/16 demonstrated no pneumothorax status post device implantation.   Brief HPI: [name] is a 81 y.o. female was referred to electrophysiology in the outpatient setting for consideration of PPM implantation.  Past medical history includes symptomatic sinus node dysfunction, HLD, and hx of breast cancer treated.  The patient has had symptomatic bradycardia without reversible causes identified.  Risks, benefits, and alternatives to PPM implantation were reviewed with the patient who wished to proceed.   Hospital Course:  The patient  was admitted and underwent implantation of a PPM with details as outlined above.  She  was monitored on telemetry overnight which demonstrated SR/A pacing.  Left chest was without hematoma or ecchymosis.  The device was interrogated and found to be functioning normally.  CXR was obtained and demonstrated no pneumothorax status post device implantation, known T spine compression fracture, patient observed to ambulate without difficulty and has no acute c/o back pain.  Wound care, arm mobility, and restrictions were reviewed with the patient.  The patient was examined by Dr. [name] Heman and considered stable for discharge to home.    Physical Exam: Vitals:   05/05/16 2029 05/05/16 2038 05/06/16 0444 05/06/16 0628  BP: (!) 167/46 (!) 133/53 (!) 148/52   Pulse: 65  65   Resp:   16   Temp: 98.2 F (36.8 C)  98 F (36.7 C)   TempSrc: Oral  Oral   SpO2: 98% 97% 98%   Weight:    160 lb 14.4 oz (73 kg)  Height:         GEN- The patient is well appearing, alert and oriented x 3 today.   HEENT: normocephalic, atraumatic; sclera clear, conjunctiva pink; hearing intact; oropharynx clear; neck supple, no JVP Lungs- Clear to ausculation bilaterally, normal work of breathing.  No wheezes, rales, rhonchi Heart- RRR, no murmurs, rubs or gallops, PMI not laterally displaced GI- soft, non-tender, non-distended Extremities- no clubbing, cyanosis, or edema MS- no significant deformity or atrophy Skin- warm and dry, no rash or lesion, left chest without hematoma/ecchymosis Psych- euthymic mood, full affect Neuro- no gross deficits  Labs:   Lab Results  Component Value Date   WBC 7.8 05/02/2016   HGB 12.9 05/02/2016   HCT 38.0 05/02/2016   MCV 94.1 05/02/2016   PLT 363 05/02/2016     Recent Labs Lab 05/02/16 1651  NA 137  K 4.7  CL 103  CO2 25  BUN 22  CREATININE 0.93  CALCIUM 9.4  GLUCOSE 102*    Discharge Medications:    Medication List    TAKE these medications   ALEVE 220 MG  tablet Generic drug:  naproxen sodium Take 220-440 mg by mouth 2 (two) times daily as needed.   aspirin 81 MG tablet Take 81 mg by mouth daily.   CALTRATE 600+D PLUS MINERALS PO Take 2 tablets by mouth daily.   cholecalciferol 1000 units tablet Commonly known as:  VITAMIN D Take 1,000 Units by mouth daily.   esomeprazole 40 MG capsule Commonly known as:  NEXIUM Take 1 capsule (40 mg total) by mouth 2 (two) times daily before a meal.   fluticasone 50 MCG/ACT nasal spray Commonly known as:  FLONASE Place 1 spray into both nostrils daily as needed.   loratadine 10 MG tablet Commonly known as:  CLARITIN Take 10 mg by mouth daily as needed for allergies.   polyethylene glycol packet Commonly known as:  MIRALAX / GLYCOLAX Take 17 g by mouth daily as needed (constipation).   RA VITAMIN B-12 TR 1000 MCG Tbcr Generic drug:  Cyanocobalamin Take 1 tablet by mouth every other day.   ranitidine 150 MG tablet Commonly known as:  ZANTAC Take 150 mg by mouth at bedtime as needed for heartburn.       Disposition:  Home  Discharge Instructions    Diet - low sodium heart healthy    Complete by:  As directed    Increase activity slowly    Complete by:  As directed      Follow-up Information    [facility] Follow up on 05/19/2016.   Specialty:  Cardiology Why:  3:00PM, wound check Contact information: [address] [place] [phone]          Duration of Discharge Encounter: Greater than 30 minutes including physician time.  Venetia Night, PA-C 05/06/2016 8:27 AM   Trude Mcburney

## 2016-05-05 NOTE — H&P (View-Only) (Signed)
HPI [name] is referred today for evaluation of symptomatic sinus pauses. She is a pleasant elderly woman with a h/o Breast CA, s/p mastectomy, who has undergone reconstruction. The patient has had a year of spells. No frank syncope but she will get dizzy and she states that it feels like everything comes to a halt. Her vision and hearing start to go away and she feels dizzy and has to hold onto something to keep from passing out. She has worn a cardiac monitor which demonstrated both daytime and nightime pauses of up to 7 seconds. She has not passed out or injured herself.   Allergies  Allergen Reactions  . Lipitor [Atorvastatin]     Lipitor caused hospitalization - depletion of electrolytes, fever, nausea, loss of appetite, couldn't get out of bed  . Statins     Lipitor caused hospitalization - - depletion of electrolytes, fever, nausea, loss of appetite, couldn't get out of bed  . Cymbalta [Duloxetine Hcl]     Dulled her too much, difficulty urinating, change in vision  . Fosamax [Alendronate Sodium]     Caused chronic issues swallowing  . Gabapentin Other (See Comments)    Dizziness and sedation  . Lyrica [Pregabalin]     Unknown     Current Outpatient Prescriptions  Medication Sig Dispense Refill  . aspirin 81 MG tablet Take 81 mg by mouth daily.    . Calcium Citrate-Vitamin D (CITRACAL/VITAMIN D PO) Take 1 tablet by mouth daily.    . cholecalciferol (VITAMIN D) 1000 UNITS tablet Take 2,000 Units by mouth daily.    . Cyanocobalamin (RA VITAMIN B-12 TR) 1000 MCG TBCR Take 1 tablet by mouth every other day.     . esomeprazole (NEXIUM) 40 MG capsule Take 1 capsule (40 mg total) by mouth 2 (two) times daily before a meal. 60 capsule 2  . fluticasone (FLONASE) 50 MCG/ACT nasal spray as directed.     . loratadine (CLARITIN) 10 MG tablet Take 10 mg by mouth daily as needed for allergies.    . polyethylene glycol (MIRALAX / GLYCOLAX) packet Take 17 g by mouth daily as needed  (constipation).     . ranitidine (ZANTAC) 150 MG tablet Take 150 mg by mouth at bedtime as needed for heartburn.    [name] tiZANidine (ZANAFLEX) 2 MG tablet Take 1 tablet (2 mg total) by mouth at bedtime as needed for muscle spasms. (Patient not taking: Reported on 05/02/2016) 20 tablet 3   No current facility-administered medications for this visit.      Past Medical History:  Diagnosis Date  . breast ca 1978  . GERD (gastroesophageal reflux disease)   . Hyperlipidemia   . IBS (irritable bowel syndrome)   . Osteoporosis    T8 compression fx 03/2015   . Vitamin B12 deficiency     ROS:   All systems reviewed and negative except as noted in the HPI.   Past Surgical History:  Procedure Laterality Date  . arm surgery     left  . [place]   With subsequent correction of surgery  . KNEE SURGERY     left  . Left Breast Implant  1981  . Left Mastectomy  1978  . NECK SURGERY  2008   c5-6  . TUBAL LIGATION       Family History  Problem Relation Age of Onset  . Diabetes Sister   . Diabetes Brother   . Breast cancer Maternal Aunt   .  Colon cancer Neg Hx      Social History   Social History  . Marital status: Married    Spouse name: N/A  . Number of children: N/A  . Years of education: N/A   Occupational History  . Not on file.   Social History Main Topics  . Smoking status: Never Smoker  . Smokeless tobacco: Never Used  . Alcohol use No  . Drug use: No  . Sexual activity: Not on file   Other Topics Concern  . Not on file   Social History Narrative   Housewife, Lives with spouse, 2 children     BP 128/60   Pulse 71   Ht 5\' 2"  (1.575 m)   Wt 163 lb 3.2 oz (74 kg)   BMI 29.85 kg/m   Physical Exam:  Well appearing NAD HEENT: Unremarkable Neck:  No JVD, no thyromegally Lymphatics:  No adenopathy Back:  No CVA tenderness Lungs:  Clear HEART:  Regular rate rhythm, no murmurs, no rubs, no clicks Abd:  soft, positive bowel sounds, no  organomegally, no rebound, no guarding Ext:  2 plus pulses, no edema, no cyanosis, no clubbing Skin:  No rashes no nodules Neuro:  CN II through XII intact, motor grossly intact  EKG - NSR with poor r wave progression  DEVICE  Normal device function.  See PaceArt for details.   Assess/Plan: 1. Sinus node dysfunction - I have discussed the problem and solution with the patient and her family and they wish to proceed with PPM insertion.  2. Dyslipidemia - she will need treatment for this in the future. 3. Dizzy spells - these are related most likely to her long pauses. Hopefully they will improve after PPM is inserted.   Mikle Bosworth.D.

## 2016-05-06 ENCOUNTER — Ambulatory Visit (HOSPITAL_COMMUNITY): Payer: Medicare Other

## 2016-05-06 DIAGNOSIS — Z803 Family history of malignant neoplasm of breast: Secondary | ICD-10-CM | POA: Diagnosis not present

## 2016-05-06 DIAGNOSIS — M810 Age-related osteoporosis without current pathological fracture: Secondary | ICD-10-CM | POA: Diagnosis not present

## 2016-05-06 DIAGNOSIS — Z800 Family history of malignant neoplasm of digestive organs: Secondary | ICD-10-CM | POA: Diagnosis not present

## 2016-05-06 DIAGNOSIS — Z7982 Long term (current) use of aspirin: Secondary | ICD-10-CM | POA: Diagnosis not present

## 2016-05-06 DIAGNOSIS — E538 Deficiency of other specified B group vitamins: Secondary | ICD-10-CM | POA: Diagnosis not present

## 2016-05-06 DIAGNOSIS — K219 Gastro-esophageal reflux disease without esophagitis: Secondary | ICD-10-CM | POA: Diagnosis not present

## 2016-05-06 DIAGNOSIS — K589 Irritable bowel syndrome without diarrhea: Secondary | ICD-10-CM | POA: Diagnosis not present

## 2016-05-06 DIAGNOSIS — Z950 Presence of cardiac pacemaker: Secondary | ICD-10-CM | POA: Diagnosis not present

## 2016-05-06 DIAGNOSIS — Z833 Family history of diabetes mellitus: Secondary | ICD-10-CM | POA: Diagnosis not present

## 2016-05-06 DIAGNOSIS — I495 Sick sinus syndrome: Secondary | ICD-10-CM | POA: Diagnosis not present

## 2016-05-06 DIAGNOSIS — E785 Hyperlipidemia, unspecified: Secondary | ICD-10-CM | POA: Diagnosis not present

## 2016-05-06 DIAGNOSIS — Z853 Personal history of malignant neoplasm of breast: Secondary | ICD-10-CM | POA: Diagnosis not present

## 2016-05-06 NOTE — Progress Notes (Signed)
Doing well s/p PPM Device interrogation reviewed and normal CXR reveals not very much atrial lead slack however lead function is normal.  DC to home today Routine wound care and follow-up  Thompson Grayer MD, [facility] 05/06/2016 8:07 AM

## 2016-05-06 NOTE — Discharge Instructions (Signed)
° ° °  Supplemental Discharge Instructions for  Pacemaker/Defibrillator Patients  Activity No heavy lifting or vigorous activity with your left/right arm for 6 to 8 weeks.  Do not raise your left/right arm above your head for one week.  Gradually raise your affected arm as drawn below.             05/09/16                  05/10/16                    05/11/16                05/12/16 __  NO DRIVING the patient doesn't drive  WOUND CARE - Keep the wound area clean and dry.  Do not get this area wet for one week. No showers for one week; you may shower on 05/12/16    . - The tape/steri-strips on your wound will fall off; do not pull them off.  No bandage is needed on the site.  DO  NOT apply any creams, oils, or ointments to the wound area. - If you notice any drainage or discharge from the wound, any swelling or bruising at the site, or you develop a fever > 101? F after you are discharged home, call the office at once.  Special Instructions - You are still able to use cellular telephones; use the ear opposite the side where you have your pacemaker/defibrillator.  Avoid carrying your cellular phone near your device. - When traveling through airports, show security personnel your identification card to avoid being screened in the metal detectors.  Ask the security personnel to use the hand wand. - Avoid arc welding equipment, MRI testing (magnetic resonance imaging), TENS units (transcutaneous nerve stimulators).  Call the office for questions about other devices. - Avoid electrical appliances that are in poor condition or are not properly grounded. - Microwave ovens are safe to be near or to operate.  Additional information for defibrillator patients should your device go off: - If your device goes off ONCE and you feel fine afterward, notify the device clinic nurses. - If your device goes off ONCE and you do not feel well afterward, call 911. - If your device goes off TWICE, call 911. - If  your device goes off THREE times in one day, call 911.  DO NOT DRIVE YOURSELF OR A FAMILY MEMBER WITH A DEFIBRILLATOR TO THE HOSPITAL--CALL 911.

## 2016-05-15 ENCOUNTER — Telehealth: Payer: Self-pay | Admitting: Internal Medicine

## 2016-05-15 NOTE — Telephone Encounter (Signed)
NEw message  Pt call requesting to speak with RN about scheduling for a December f/u appt. Pt states she will be out of town and wanted to know if it will be an issue with scheduling sooner than expected time. Please call back to discuss

## 2016-05-16 NOTE — Telephone Encounter (Signed)
Returned pt call. Pt stated she is going out-of-town for 3 months after 07/28/16. Pt requested f/u appt for packer check in December. I rescheduled her appt from 07/31/16 to 07/15/16 at 9:15, arriving at 9:00 AM. Pt verbalized understanding and thanked me for calling.

## 2016-05-19 ENCOUNTER — Encounter: Payer: Self-pay | Admitting: Gastroenterology

## 2016-05-19 ENCOUNTER — Ambulatory Visit (INDEPENDENT_AMBULATORY_CARE_PROVIDER_SITE_OTHER): Payer: Medicare Other | Admitting: Gastroenterology

## 2016-05-19 ENCOUNTER — Ambulatory Visit (INDEPENDENT_AMBULATORY_CARE_PROVIDER_SITE_OTHER): Payer: Medicare Other | Admitting: *Deleted

## 2016-05-19 VITALS — BP 130/60 | HR 64 | Ht 61.5 in | Wt 164.0 lb

## 2016-05-19 DIAGNOSIS — I495 Sick sinus syndrome: Secondary | ICD-10-CM | POA: Diagnosis not present

## 2016-05-19 DIAGNOSIS — K5900 Constipation, unspecified: Secondary | ICD-10-CM | POA: Diagnosis not present

## 2016-05-19 DIAGNOSIS — Z950 Presence of cardiac pacemaker: Secondary | ICD-10-CM

## 2016-05-19 DIAGNOSIS — R1314 Dysphagia, pharyngoesophageal phase: Secondary | ICD-10-CM | POA: Diagnosis not present

## 2016-05-19 DIAGNOSIS — K219 Gastro-esophageal reflux disease without esophagitis: Secondary | ICD-10-CM

## 2016-05-19 LAB — CUP PACEART INCLINIC DEVICE CHECK
Brady Statistic RV Percent Paced: 1 % — CL
Date Time Interrogation Session: 20171023040000
Implantable Lead Implant Date: 20171009
Implantable Lead Location: 753859
Implantable Lead Model: 7741
Lead Channel Impedance Value: 517 Ohm
Lead Channel Impedance Value: 923 Ohm
Lead Channel Pacing Threshold Pulse Width: 0.4 ms
Lead Channel Setting Pacing Amplitude: 3.5 V
Lead Channel Setting Pacing Amplitude: 3.5 V
Lead Channel Setting Pacing Pulse Width: 0.4 ms
MDC IDC LEAD IMPLANT DT: 20171009
MDC IDC LEAD LOCATION: 753860
MDC IDC LEAD SERIAL: 785430
MDC IDC MSMT LEADCHNL RA PACING THRESHOLD AMPLITUDE: 0.7 V
MDC IDC MSMT LEADCHNL RA PACING THRESHOLD PULSEWIDTH: 1 ms
MDC IDC MSMT LEADCHNL RA SENSING INTR AMPL: 3.5 mV
MDC IDC MSMT LEADCHNL RV PACING THRESHOLD AMPLITUDE: 0.6 V
MDC IDC MSMT LEADCHNL RV SENSING INTR AMPL: 13 mV
MDC IDC PG SERIAL: 766467
MDC IDC SET LEADCHNL RV SENSING SENSITIVITY: 2.5 mV
MDC IDC STAT BRADY RA PERCENT PACED: 29 %

## 2016-05-19 MED ORDER — ESOMEPRAZOLE MAGNESIUM 40 MG PO CPDR: 40.0000 mg | DELAYED_RELEASE_CAPSULE | Freq: Two times a day (BID) | ORAL | 11 refills | Status: DC

## 2016-05-19 NOTE — Progress Notes (Signed)
    History of Present Illness: This is a 81 year old female returning for follow-up of GERD and dysphagia. Both have been chronic problems. Prior EGDs in 1999 and 2006 did not reveal a stricture and her symptoms responded to treatment for GERD. She underwent a barium esophagram on 03/03/2016 which was normal. Nexium was increased to 40 mg twice a day in July and her dysphagia has resolved. She notes persistent problems with hoarseness and vocal fatigue that have not improved at all on Nexium 40 mg twice a day. She is followed at [facility] but she does not recall the physician. She is having difficulty managing her constipation and her stools tend to be loose when she uses MiraLAX but when she discontinues MiraLAX she has constipation which is worse for her. She had a pacemaker placed earlier this month for sinus node dysfunction.  Current Medications, Allergies, Past Medical History, Past Surgical History, Family History and Social History were reviewed in [employer] record.  Physical Exam: General: Well developed, well nourished, elderly, no acute distress Head: Normocephalic and atraumatic Eyes:  sclerae anicteric, EOMI Ears: Normal auditory acuity Mouth: No deformity or lesions Lungs: Clear throughout to auscultation Heart: Regular rate and rhythm; no murmurs, rubs or bruits Abdomen: Soft, non tender and non distended. No masses, hepatosplenomegaly or hernias noted. Normal Bowel sounds Musculoskeletal: Symmetrical with no gross deformities  Pulses:  Normal pulses noted Extremities: No clubbing, cyanosis, edema or deformities noted Neurological: Alert oriented x 4, grossly nonfocal Psychological:  Alert and cooperative. Anxious.   Assessment and Recommendations:  1. GERD and dysphagia. Dysphasia has resolved on higher dose Nexium. Hoarseness and voice fatigue have not improved at all. I advised her to follow-up with her ENT physician as her ENT symptoms did not  appear to be related to LPR. Esomeprazole 40 mg twice daily and resume ranitidine 150 mg at bedtime. REV in 1 year.   2. Constipation. Continue MiraLAX daily. May reduce dose from a full scoop to three quarters of the skin or half a scoop. Titrate as appropriate. Temporarily discontinue stool softener and assess impact. Further follow-up per PCP.  I spent 15 minutes of face-to-face time with the patient. Greater than 50% of the time was spent counseling and coordinating care.

## 2016-05-19 NOTE — Patient Instructions (Signed)
We have sent the following medications to your pharmacy for you to pick up at your convenience: esomeprazole.  Start taking ranitidine 150 mg again at bedtime.  Continue Miralax daily.   Thank you for choosing me and [facility].  Pricilla Riffle. Dagoberto Ligas., MD., Marval Regal

## 2016-05-19 NOTE — Progress Notes (Signed)
Wound check appointment. Steri-strips removed. Wound without redness or edema. Stitch removed from center incision, antibiotic ointment and bandaid applied. Patient aware to remove tomorrow and call if any signs/symptoms of infection develop. Incision edges otherwise approximated, wound well healed. Normal device function. Thresholds, sensing, and impedances consistent with implant measurements. Device programmed at 3.5V for extra safety margin until 3 month visit. Histogram distribution appropriate for patient and level of activity. 1 mode switch--appears FFRW from day of implant, PVAB already programmed to cover. No high ventricular rates noted. Patient educated about wound care, arm mobility, lifting restrictions. ROV with GT on 08/05/16.

## 2016-05-26 ENCOUNTER — Telehealth: Payer: Self-pay

## 2016-05-26 NOTE — Telephone Encounter (Signed)
Pt called and stated that she had an episode at church yesterday, asked pt what her symptoms were, pt stated "my legs felt weak and I just felt like I couldn't go anymore" Pt stated that she had ate breakfast that morning and took her medications as prescribed, also that she had been drinking water. Pt sent in a transmission, informed pt that there were no episodes and her transmission was normal. Encouraged pt to keep a journal of her symptoms and when they happened and to follow-up with her primary care doctor. Pt voiced understanding.

## 2016-06-02 ENCOUNTER — Ambulatory Visit (INDEPENDENT_AMBULATORY_CARE_PROVIDER_SITE_OTHER): Payer: Medicare Other

## 2016-06-02 DIAGNOSIS — Z23 Encounter for immunization: Secondary | ICD-10-CM | POA: Diagnosis not present

## 2016-06-17 ENCOUNTER — Encounter: Payer: Self-pay | Admitting: Gastroenterology

## 2016-06-22 NOTE — Patient Instructions (Addendum)
  Test(s) ordered today. Your results will be released to [facility] (or called to you) after review, usually within 72hours after test completion. If any changes need to be made, you will be notified at that same time.   Medications reviewed and updated.  No changes recommended at this time.   A referral for physical therapy was ordered  Please followup in 1 month

## 2016-06-22 NOTE — Progress Notes (Signed)
Subjective:    Patient ID: [name], female    DOB: Jan 22, 1935, 81 y.o.   MRN: [id number]  HPI She is here for follow up.    Since she was here last she had a PPM placed for sinus node dysfunction.  She is still having multiple symptoms that concern her.  She gets lightheaded and feel unbalanced at times.  She has leg pain and weakness.    GERD:  She is taking her medication daily as prescribed.  She denies any GERD symptoms and feels her GERD is well controlled.   Prediabetes:  She is compliant with a low sugar/carbohydrate diet.  She is not exercising regularly.  B/l leg pain:  She has b/l pressure or needle sensation in her posterior - it is intermittent.  Her legs feel weak.  It is worse with walking or sitting.  She feels it when she is laying down.  Sometimes when she is walking her legs feel weak and she needs to sit down.  Nothing makes it better. She denies numbness/tingling in her legs.  She gets leg cramps.    Neck pain;  She has chronic neck pain.  She has intermittent tingling in her left arm and it is intermittent.   She has seen Dr Sherwood Gambler and he did xrays.  He did not feel an MRI was necessary.   Medications and allergies reviewed with patient and updated if appropriate.  Patient Active Problem List   Diagnosis Date Noted  . Sinus node dysfunction ([place]) 05/05/2016  . Right shoulder pain 03/14/2016  . Multinodular goiter (nontoxic) 02/21/2016  . Neck pain 02/15/2016  . Pain in limb 02/15/2016  . Neck nodule 02/15/2016  . Prediabetes 12/17/2015  . Dysphagia 12/17/2015  . Anxiety 11/13/2015  . T8 vertebral fracture ([place]) 04/09/2015  . Overweight 10/09/2011  . Allergic rhinitis, cause unspecified   . GERD (gastroesophageal reflux disease) 02/05/2011  . EPISTAXIS 07/23/2010  . FATIGUE 09/28/2009  . Vitamin B 12 deficiency 04/17/2009  . Hyperlipidemia 04/17/2009  . Osteoporosis 04/17/2009  . ADENOCARCINOMA, BREAST, HX OF 04/17/2009  . CONSTIPATION  03/08/2009  . COLONIC POLYPS, HYPERPLASTIC, HX OF 03/06/2009    Current Outpatient Prescriptions on File Prior to Visit  Medication Sig Dispense Refill  . aspirin 81 MG tablet Take 81 mg by mouth daily.    . Calcium Carbonate-Vit D-Min (CALTRATE 600+D PLUS MINERALS PO) Take 2 tablets by mouth 2 (two) times daily.     . cholecalciferol (VITAMIN D) 1000 UNITS tablet Take 1,000 Units by mouth daily.     . Cyanocobalamin (RA VITAMIN B-12 TR) 1000 MCG TBCR Take 1 tablet by mouth every other day.     . esomeprazole (NEXIUM) 40 MG capsule Take 1 capsule (40 mg total) by mouth 2 (two) times daily before a meal. 60 capsule 11  . fluticasone (FLONASE) 50 MCG/ACT nasal spray Place 1 spray into both nostrils daily as needed.     . loratadine (CLARITIN) 10 MG tablet Take 10 mg by mouth daily as needed for allergies.    . naproxen sodium (ALEVE) 220 MG tablet Take 220-440 mg by mouth 2 (two) times daily as needed.    . polyethylene glycol (MIRALAX / GLYCOLAX) packet Take 17 g by mouth daily as needed (constipation).     . ranitidine (ZANTAC) 150 MG tablet Take 150 mg by mouth at bedtime as needed for heartburn.     No current facility-administered medications on file prior to visit.  Past Medical History:  Diagnosis Date  . Cancer of left breast ([place]) 1978  . Chronic thoracic back pain    "T8; fracture; 03/2015; no OR" (05/05/2016)  . GERD (gastroesophageal reflux disease)   . Heart murmur   . History of hiatal hernia   . Hyperlipidemia   . IBS (irritable bowel syndrome)   . Multiple thyroid nodules   . Osteoporosis    T8 compression fx 03/2015   . Osteoporosis   . Presence of permanent cardiac pacemaker   . Vitamin B12 deficiency     Past Surgical History:  Procedure Laterality Date  . ANTERIOR CERVICAL DECOMP/DISCECTOMY FUSION  2008   C5-6  . BACK SURGERY    . BREAST SURGERY    . CARDIAC CATHETERIZATION  2001  . EP IMPLANTABLE DEVICE N/A 05/05/2016   Procedure: Pacemaker Implant;   Surgeon: Evans Lance, MD;  Location: [facility];  Service: Cardiovascular;  Laterality: N/A;  . EXCISIONAL HEMORRHOIDECTOMY  1984   With subsequent correction of surgery  . INSERT / REPLACE / REMOVE PACEMAKER    . KNEE ARTHROSCOPY Left    "meniscus tear"  . MASTECTOMY Left 1978  . PLACEMENT OF BREAST IMPLANTS Left 1981  . SHOULDER ARTHROSCOPY W/ ROTATOR CUFF REPAIR Left 02/2006   "tear"  . TUBAL LIGATION      Social History   Social History  . Marital status: Married    Spouse name: N/A  . Number of children: N/A  . Years of education: N/A   Social History Main Topics  . Smoking status: Never Smoker  . Smokeless tobacco: Never Used  . Alcohol use No  . Drug use: No  . Sexual activity: Not on file   Other Topics Concern  . Not on file   Social History Narrative   Housewife, Lives with spouse, 2 children    Family History  Problem Relation Age of Onset  . Diabetes Sister   . Diabetes Brother   . Breast cancer Maternal Aunt   . Colon cancer Neg Hx     Review of Systems  Constitutional: Negative for fever.  HENT: Positive for ear pain (right ear intermittent) and sneezing.   Respiratory: Positive for shortness of breath (when walking). Negative for cough and wheezing.   Cardiovascular: Negative for chest pain, palpitations and leg swelling.  Neurological: Positive for weakness (legs), light-headedness and numbness (tingling in left arm intermittent). Negative for dizziness and headaches.       Objective:   Vitals:   06/23/16 0800  BP: 136/60  Pulse: 79  Resp: 16  Temp: 98.5 F (36.9 C)   Filed Weights   06/23/16 0800  Weight: 165 lb (74.8 kg)   Body mass index is 30.18 kg/m.   Physical Exam Constitutional: Appears well-developed and well-nourished. No distress.  HENT:  Head: Normocephalic and atraumatic.  Neck: Neck supple. No tracheal deviation present. No thyromegaly present.  No cervical lymphadenopathy Cardiovascular: Normal rate,  regular rhythm and normal heart sounds.   2/6 systolic murmur heard. No carotid bruit .  No edema Pulmonary/Chest: Effort normal and breath sounds normal. No respiratory distress. No has no wheezes. No rales.  Neuro: normal sensation and strength in UE and LE b/l, difficulty getting up from chair without assistance Skin: Skin is warm and dry. Not diaphoretic.  Psychiatric: Normal mood and affect. Behavior is normal.        Assessment & Plan:   See Problem List for Assessment and Plan of chronic  medical problems.

## 2016-06-23 ENCOUNTER — Ambulatory Visit (INDEPENDENT_AMBULATORY_CARE_PROVIDER_SITE_OTHER)
Admission: RE | Admit: 2016-06-23 | Discharge: 2016-06-23 | Disposition: A | Discharge status: Home / Self Care | Payer: Medicare Other | Source: Ambulatory Visit | Attending: Internal Medicine | Admitting: Internal Medicine

## 2016-06-23 ENCOUNTER — Encounter: Payer: Self-pay | Admitting: Internal Medicine

## 2016-06-23 ENCOUNTER — Ambulatory Visit (INDEPENDENT_AMBULATORY_CARE_PROVIDER_SITE_OTHER): Payer: Medicare Other | Admitting: Internal Medicine

## 2016-06-23 VITALS — BP 136/60 | HR 79 | Temp 98.5°F | Resp 16 | Ht 62.0 in | Wt 165.0 lb

## 2016-06-23 DIAGNOSIS — R42 Dizziness and giddiness: Secondary | ICD-10-CM

## 2016-06-23 DIAGNOSIS — K219 Gastro-esophageal reflux disease without esophagitis: Secondary | ICD-10-CM | POA: Diagnosis not present

## 2016-06-23 DIAGNOSIS — R29898 Other symptoms and signs involving the musculoskeletal system: Secondary | ICD-10-CM

## 2016-06-23 DIAGNOSIS — R7303 Prediabetes: Secondary | ICD-10-CM | POA: Diagnosis not present

## 2016-06-23 DIAGNOSIS — M810 Age-related osteoporosis without current pathological fracture: Secondary | ICD-10-CM

## 2016-06-23 DIAGNOSIS — R531 Weakness: Secondary | ICD-10-CM | POA: Diagnosis not present

## 2016-06-23 DIAGNOSIS — R011 Cardiac murmur, unspecified: Secondary | ICD-10-CM

## 2016-06-23 DIAGNOSIS — R2689 Other abnormalities of gait and mobility: Secondary | ICD-10-CM

## 2016-06-23 NOTE — Assessment & Plan Note (Signed)
Will have prolia next month Taking calcium, vitamin d Stressed regular exercise

## 2016-06-23 NOTE — Assessment & Plan Note (Signed)
Intermittent Not improved with PPM Check Echo, carotid US Will follow up with cardio

## 2016-06-23 NOTE — Assessment & Plan Note (Signed)
GERD controlled Continue daily medication  

## 2016-06-23 NOTE — Assessment & Plan Note (Signed)
Stressed PT - referred Will follow up with neurology next month for further evaluation

## 2016-06-23 NOTE — Progress Notes (Signed)
Pre visit review using our clinic review tool, if applicable. No additional management support is needed unless otherwise documented below in the visit note. 

## 2016-06-23 NOTE — Assessment & Plan Note (Signed)
Referred to PT Lumbar spine xray Will follow up with neuro next month

## 2016-06-23 NOTE — Assessment & Plan Note (Signed)
Stressed regular exercise Complaint with a low sugar/carb diet

## 2016-07-01 ENCOUNTER — Telehealth: Payer: Self-pay | Admitting: Internal Medicine

## 2016-07-01 ENCOUNTER — Emergency Department (HOSPITAL_COMMUNITY): Payer: Medicare Other

## 2016-07-01 ENCOUNTER — Telehealth: Payer: Self-pay | Admitting: Cardiology

## 2016-07-01 ENCOUNTER — Emergency Department (HOSPITAL_COMMUNITY)
Admission: EM | Admit: 2016-07-01 | Discharge: 2016-07-01 | Disposition: A | Discharge status: Home / Self Care | Payer: Medicare Other | Attending: Emergency Medicine | Admitting: Emergency Medicine

## 2016-07-01 ENCOUNTER — Encounter (HOSPITAL_COMMUNITY): Payer: Self-pay | Admitting: Emergency Medicine

## 2016-07-01 DIAGNOSIS — Z7982 Long term (current) use of aspirin: Secondary | ICD-10-CM | POA: Diagnosis not present

## 2016-07-01 DIAGNOSIS — R42 Dizziness and giddiness: Secondary | ICD-10-CM | POA: Insufficient documentation

## 2016-07-01 DIAGNOSIS — Z79899 Other long term (current) drug therapy: Secondary | ICD-10-CM | POA: Diagnosis not present

## 2016-07-01 DIAGNOSIS — Z853 Personal history of malignant neoplasm of breast: Secondary | ICD-10-CM | POA: Diagnosis not present

## 2016-07-01 LAB — BASIC METABOLIC PANEL
ANION GAP: 8 (ref 5–15)
BUN: 19 mg/dL (ref 6–20)
CALCIUM: 8.6 mg/dL — AB (ref 8.9–10.3)
CO2: 25 mmol/L (ref 22–32)
Chloride: 105 mmol/L (ref 101–111)
Creatinine, Ser: 1.05 mg/dL — ABNORMAL HIGH (ref 0.44–1.00)
GFR, EST AFRICAN AMERICAN: 57 mL/min — AB (ref >60)
GFR, EST NON AFRICAN AMERICAN: 49 mL/min — AB (ref >60)
Glucose, Bld: 147 mg/dL — ABNORMAL HIGH (ref 65–99)
Potassium: 4.1 mmol/L (ref 3.5–5.1)
Sodium: 138 mmol/L (ref 135–145)

## 2016-07-01 LAB — CBC
HCT: 37 % (ref 36.0–46.0)
HEMOGLOBIN: 12.3 g/dL (ref 12.0–15.0)
MCH: 32.1 pg (ref 26.0–34.0)
MCHC: 33.2 g/dL (ref 30.0–36.0)
MCV: 96.6 fL (ref 78.0–100.0)
Platelets: 357 10*3/uL (ref 150–400)
RBC: 3.83 MIL/uL — AB (ref 3.87–5.11)
RDW: 13.6 % (ref 11.5–15.5)
WBC: 6.4 10*3/uL (ref 4.0–10.5)

## 2016-07-01 LAB — URINALYSIS, ROUTINE W REFLEX MICROSCOPIC
BILIRUBIN URINE: NEGATIVE
Glucose, UA: NEGATIVE mg/dL
KETONES UR: NEGATIVE mg/dL
NITRITE: NEGATIVE
PROTEIN: NEGATIVE mg/dL
Specific Gravity, Urine: 1.006 (ref 1.005–1.030)
pH: 6 (ref 5.0–8.0)

## 2016-07-01 LAB — CBG MONITORING, ED: GLUCOSE-CAPILLARY: 112 mg/dL — AB (ref 65–99)

## 2016-07-01 MED ORDER — SODIUM CHLORIDE 0.9 % IV BOLUS (SEPSIS)
500.0000 mL | Freq: Once | INTRAVENOUS | Status: AC
  Administered 2016-07-01: 500 mL via INTRAVENOUS

## 2016-07-01 MED ORDER — IOPAMIDOL (ISOVUE-370) INJECTION 76%
INTRAVENOUS | Status: AC
  Filled 2016-07-01: qty 100

## 2016-07-01 MED ORDER — MECLIZINE HCL 25 MG PO TABS: 12.5000 mg | ORAL_TABLET | Freq: Three times a day (TID) | ORAL | 0 refills | Status: DC | PRN

## 2016-07-01 MED ORDER — SODIUM CHLORIDE 0.9 % IJ SOLN
INTRAMUSCULAR | Status: AC
  Filled 2016-07-01: qty 50

## 2016-07-01 MED ORDER — IOPAMIDOL (ISOVUE-370) INJECTION 76%
100.0000 mL | Freq: Once | INTRAVENOUS | Status: AC | PRN
  Administered 2016-07-01: 100 mL via INTRAVENOUS

## 2016-07-01 NOTE — Telephone Encounter (Signed)
Noted in [facility] ED

## 2016-07-01 NOTE — Telephone Encounter (Signed)
[place] [facility] - Client [facility]  Patient Name: [name]  DOB: Sep 13, 1934    Initial Comment Caller States- I had an episode this morning, I was spinning all around in my head, and something snapped in the back in my head. I just keep spinning around in my head, checked my pace maker and it is fine. Md has me schedule for caratoid aretery check later this month   Nurse Assessment  Nurse: Wayne Sever, RN, Tillie Rung Date/Time (Eastern Time): 07/01/2016 11:46:43 AM  Confirm and document reason for call. If symptomatic, describe symptoms. ---Caller states she got up at 5am and heard a crack on the back of her neck and her head was spinning. She had another episode this morning. She is feeling weak after this. She states her pacemaker was check and was fine.  Does the patient have any new or worsening symptoms? ---Yes  Will a triage be completed? ---Yes  Related visit to physician within the last 2 weeks? ---No  Does the PT have any chronic conditions? (i.e. diabetes, asthma, etc.) ---Yes  List chronic conditions. ---Pacemaker,  Is this a behavioral health or substance abuse call? ---No     Guidelines    Guideline Title Affirmed Question Affirmed Notes  Weakness (Generalized) and Fatigue [1] MODERATE weakness (i.e., interferes with work, school, normal activities) AND [2] cause unknown (Exceptions: weakness with acute minor illness, or weakness from poor fluid intake)    Final Disposition User   See Physician within 4 Hours (or PCP triage) Wayne Sever, RN, Kendra    Referrals  [facility] - ED   Disagree/Comply: Comply

## 2016-07-01 NOTE — Telephone Encounter (Signed)
Pt called and stated that she got up and went to the bathroom and when she layed back down and turned her neck on the pillow and she heard a pop or cracking sound and she became light headed. Pt stated when she got up again around 6:30 she became dizzy again but this time she did not hear the pop or cracking sound again. Instructed pt to send a manual transmission w/ her home monitor. Once transmission is received a device tech will review and call pt back w/ results. Pt verbalized understanding.

## 2016-07-01 NOTE — Telephone Encounter (Signed)
Spoke with patient who expressed that at implant Dr. Lovena Le verbalized difficulty in placing a lead. She was concerned that the noise she heard was her lead coming out. I explained that her remote showed no lead issues and that impedance trends were stable. She described frequent "spells" and that her PCP had scheduled a carotid US. I encouraged her that her device was functioning appropriately but that she could reach out to her PCP to update them on how she was feeling. She verbalized understanding.

## 2016-07-01 NOTE — ED Triage Notes (Signed)
Pt reports was in bed when she felt a "crack" in her neck, also sts felt dizzy and feeling of spinning around while still in bed laying flat. No vision changes, nor headache. No chest pain . sts feels weak and off balance. Denies hX vertigo. Alert and oriented x 4. Ambulated to triage room with steady gait . Hx neck surgery. No neuro deficit noted.

## 2016-07-01 NOTE — ED Provider Notes (Signed)
[facility] DEPT Provider Note   CSN: [id number] Arrival date & time: 07/01/16  1307     History   Chief Complaint Chief Complaint  Patient presents with  . Dizziness  . Neck Pain    HPI [name] is a 81 y.o. female.  HPI Patient presents with episodic dizziness. States that she woke this morning and felt the room spinning around. She states that there was a little nauseous with it. States she feels somewhat better now. States she had gotten up and then it came back. She does not have the dizziness now. She's had previous unsteadiness that has been worked up but this was more spitting out the unsteadiness. States her legs feel weak but they have felt weak for over a year. She states when it started she had a pain in her right upper neck that started around the same time. She does have chronic neck pain also. She has chronic ringing in her ears too. she has seen Dr. Wilburn Cornelia for her tinnitus in the past.    Past Medical History:  Diagnosis Date  . Cancer of left breast ([place]) 1978  . Chronic thoracic back pain    "T8; fracture; 03/2015; no OR" (05/05/2016)  . GERD (gastroesophageal reflux disease)   . Heart murmur   . History of hiatal hernia   . Hyperlipidemia   . IBS (irritable bowel syndrome)   . Multiple thyroid nodules   . Osteoporosis    T8 compression fx 03/2015   . Osteoporosis   . Presence of permanent cardiac pacemaker   . Vitamin B12 deficiency     Patient Active Problem List   Diagnosis Date Noted  . Undiagnosed cardiac murmurs 06/23/2016  . Poor balance 06/23/2016  . Lightheadedness 06/23/2016  . Weakness of both lower extremities 06/23/2016  . Sinus node dysfunction ([place]) 05/05/2016  . Right shoulder pain 03/14/2016  . Multinodular goiter (nontoxic) 02/21/2016  . Neck pain 02/15/2016  . Pain in limb 02/15/2016  . Neck nodule 02/15/2016  . Prediabetes 12/17/2015  . Dysphagia 12/17/2015  . Anxiety 11/13/2015  . T8 vertebral fracture ([place])  04/09/2015  . Overweight 10/09/2011  . Allergic rhinitis, cause unspecified   . GERD (gastroesophageal reflux disease) 02/05/2011  . EPISTAXIS 07/23/2010  . FATIGUE 09/28/2009  . Vitamin B 12 deficiency 04/17/2009  . Hyperlipidemia 04/17/2009  . Osteoporosis 04/17/2009  . ADENOCARCINOMA, BREAST, HX OF 04/17/2009  . CONSTIPATION 03/08/2009  . COLONIC POLYPS, HYPERPLASTIC, HX OF 03/06/2009    Past Surgical History:  Procedure Laterality Date  . ANTERIOR CERVICAL DECOMP/DISCECTOMY FUSION  2008   C5-6  . BACK SURGERY    . BREAST SURGERY    . CARDIAC CATHETERIZATION  2001  . EP IMPLANTABLE DEVICE N/A 05/05/2016   Procedure: Pacemaker Implant;  Surgeon: Evans Lance, MD;  Location: [facility];  Service: Cardiovascular;  Laterality: N/A;  . EXCISIONAL HEMORRHOIDECTOMY  1984   With subsequent correction of surgery  . INSERT / REPLACE / REMOVE PACEMAKER    . KNEE ARTHROSCOPY Left    "meniscus tear"  . MASTECTOMY Left 1978  . PLACEMENT OF BREAST IMPLANTS Left 1981  . SHOULDER ARTHROSCOPY W/ ROTATOR CUFF REPAIR Left 02/2006   "tear"  . TUBAL LIGATION      OB History    No data available       Home Medications    Prior to Admission medications   Medication Sig Start Date End Date Taking? Authorizing Provider  aspirin 81 MG  tablet Take 81 mg by mouth daily.   Yes Historical Provider, MD  Calcium Carbonate-Vit D-Min (CALTRATE 600+D PLUS MINERALS PO) Take 2 tablets by mouth 2 (two) times daily.    Yes Historical Provider, MD  cholecalciferol (VITAMIN D) 1000 UNITS tablet Take 2,000 Units by mouth daily.    Yes Historical Provider, MD  Cyanocobalamin (RA VITAMIN B-12 TR) 1000 MCG TBCR Take 1 tablet by mouth every other day.    Yes Historical Provider, MD  esomeprazole (NEXIUM) 40 MG capsule Take 1 capsule (40 mg total) by mouth 2 (two) times daily before a meal. 05/19/16  Yes Ladene Artist, MD  fluticasone (FLONASE) 50 MCG/ACT nasal spray Place 1 spray into both nostrils  daily as needed for allergies.  01/21/16  Yes Historical Provider, MD  loratadine (CLARITIN) 10 MG tablet Take 10 mg by mouth daily as needed for allergies.   Yes Historical Provider, MD  naproxen sodium (ALEVE) 220 MG tablet Take 220 mg by mouth 2 (two) times daily with a meal.    Yes Historical Provider, MD  polyethylene glycol (MIRALAX / GLYCOLAX) packet Take 17 g by mouth daily as needed (constipation).    Yes Historical Provider, MD  ranitidine (ZANTAC) 150 MG tablet Take 150 mg by mouth at bedtime as needed for heartburn.   Yes Historical Provider, MD  meclizine (ANTIVERT) 25 MG tablet Take 0.5 tablets (12.5 mg total) by mouth 3 (three) times daily as needed for dizziness. 07/01/16   Davonna Belling, MD    Family History Family History  Problem Relation Age of Onset  . Diabetes Sister   . Diabetes Brother   . Breast cancer Maternal Aunt   . Colon cancer Neg Hx     Social History Social History  Substance Use Topics  . Smoking status: Never Smoker  . Smokeless tobacco: Never Used  . Alcohol use No     Allergies   Lipitor [atorvastatin]; Statins; Cymbalta [duloxetine hcl]; Fosamax [alendronate sodium]; Gabapentin; Lyrica [pregabalin]; and Tizanidine   Review of Systems Review of Systems  Constitutional: Negative for appetite change.  HENT: Negative for congestion.   Respiratory: Negative for chest tightness.   Cardiovascular: Negative for chest pain.  Gastrointestinal: Negative for abdominal pain.  Genitourinary: Negative for difficulty urinating.  Musculoskeletal: Negative for back pain.  Neurological: Positive for dizziness and weakness.  Hematological: Negative for adenopathy.  Psychiatric/Behavioral: Negative for agitation.     Physical Exam Updated Vital Signs BP 94/76 (BP Location: Right Arm)   Pulse 77   Temp 98 F (36.7 C) (Oral)   Resp 14   SpO2 97%   Physical Exam  Constitutional: She is oriented to person, place, and time. She appears  well-developed.  HENT:  Head: Atraumatic.  Eyes: EOM are normal. Pupils are equal, round, and reactive to light.  Right TM normal.  Neck: Neck supple.  Cardiovascular: Normal rate.   Pulmonary/Chest: Effort normal.  Abdominal: Soft.  Musculoskeletal: She exhibits edema.  Mild tenderness to right upper posterior cervical area.  Neurological: She is alert and oriented to person, place, and time.  Extraocular movements intact. Finger-nose intact bilaterally. Mildly unsteady with standing. Does normalize somewhat. Good grip strength bilaterally.  Skin: Skin is warm.  Psychiatric: She has a normal mood and affect.     ED Treatments / Results  Labs (all labs ordered are listed, but only abnormal results are displayed) Labs Reviewed  BASIC METABOLIC PANEL - Abnormal; Notable for the following:  Result Value   Glucose, Bld 147 (*)    Creatinine, Ser 1.05 (*)    Calcium 8.6 (*)    GFR calc non Af Amer 49 (*)    GFR calc Af Amer 57 (*)    All other components within normal limits  CBC - Abnormal; Notable for the following:    RBC 3.83 (*)    All other components within normal limits  URINALYSIS, ROUTINE W REFLEX MICROSCOPIC - Abnormal; Notable for the following:    Color, Urine STRAW (*)    Hgb urine dipstick MODERATE (*)    Leukocytes, UA LARGE (*)    Bacteria, UA RARE (*)    Squamous Epithelial / LPF 0-5 (*)    All other components within normal limits  CBG MONITORING, ED - Abnormal; Notable for the following:    Glucose-Capillary 112 (*)    All other components within normal limits  URINE CULTURE    EKG  EKG Interpretation None       Radiology Ct Angio Head W Or Wo Contrast  Result Date: 07/01/2016 CLINICAL DATA:  Vertigo.  Felt a pop behind the right ear EXAM: CT ANGIOGRAPHY HEAD AND NECK TECHNIQUE: Multidetector CT imaging of the head and neck was performed using the standard protocol during bolus administration of intravenous contrast. Multiplanar CT image  reconstructions and MIPs were obtained to evaluate the vascular anatomy. Carotid stenosis measurements (when applicable) are obtained utilizing NASCET criteria, using the distal internal carotid diameter as the denominator. CONTRAST:  100 cc Isovue 370 intravenous COMPARISON:  None. FINDINGS: CT HEAD FINDINGS Brain: No evidence of acute infarction, hemorrhage, hydrocephalus, extra-axial collection or mass lesion/mass effect. Vascular: Described below Skull: Negative for fracture Sinuses: Clear.  No mastoid or middle ear fluid. Orbits: Negative Review of the MIP images confirms the above findings CTA NECK FINDINGS Aortic arch: Diffuse atherosclerotic calcification. Mild narrowing of the great vessel origins. Three vessel branching. No acute finding. Right carotid system: Limited by motion and streak artifact proximally. Mild atherosclerotic plaque at the bifurcation. No stenosis, dissection, or ulceration. Left carotid system: Prominent calcified plaque at the ostium with mild narrowing. Mild plaque at the bifurcation. No spur flow limiting stenosis, dissection, or ulceration. Vertebral arteries: Advanced atherosclerotic disease in the proximal left subclavian artery with moderate stenosis before and after the vertebral artery origin, up to 50%. No flow limiting stenosis in the right subclavian artery. Mild to moderate right vertebral artery origin stenosis. No evidence of dissection. Both vessels are smooth and patent to the dura. Skeleton: No acute or aggressive finding Other neck: Small bilateral thyroid nodules measuring less than 1 cm. Upper chest: Partly visualized pacer and left breast surgery. Nonspecific patchy atelectasis or other ground-glass density in the motion degraded upper lungs. Review of the MIP images confirms the above findings CTA HEAD FINDINGS Limited by venous contamination. Anterior circulation: Symmetric carotid arteries. No major branch occlusion or flow limiting stenosis. Negative for  aneurysm. Posterior circulation: Fetal type PCA on the left. Early branching right PICA, extradural. No major branch occlusion or flow limiting stenosis. Negative for aneurysm. Venous sinuses: Patent and unremarkable. Right suboccipital veins have asymmetric delayed enhancement compared to the left, without detected stenosis or clot. Question if this is related to early imaging and dominance of the left transverse venous system. Anatomic variants: Fetal type PCA on the left Delayed phase: No parenchymal enhancement or mass. Review of the MIP images confirms the above findings IMPRESSION: 1. No acute finding.  No evidence of dissection.  2. Advanced atherosclerosis in the proximal left subclavian artery with up to 50% narrowing. 3. Mild to moderate right vertebral artery ostial stenosis. 4. No carotid or intracranial flow limiting stenosis. Electronically Signed   By: Monte Fantasia M.D.   On: 07/01/2016 16:45   Ct Angio Neck W And/or Wo Contrast  Result Date: 07/01/2016 CLINICAL DATA:  Vertigo.  Felt a pop behind the right ear EXAM: CT ANGIOGRAPHY HEAD AND NECK TECHNIQUE: Multidetector CT imaging of the head and neck was performed using the standard protocol during bolus administration of intravenous contrast. Multiplanar CT image reconstructions and MIPs were obtained to evaluate the vascular anatomy. Carotid stenosis measurements (when applicable) are obtained utilizing NASCET criteria, using the distal internal carotid diameter as the denominator. CONTRAST:  100 cc Isovue 370 intravenous COMPARISON:  None. FINDINGS: CT HEAD FINDINGS Brain: No evidence of acute infarction, hemorrhage, hydrocephalus, extra-axial collection or mass lesion/mass effect. Vascular: Described below Skull: Negative for fracture Sinuses: Clear.  No mastoid or middle ear fluid. Orbits: Negative Review of the MIP images confirms the above findings CTA NECK FINDINGS Aortic arch: Diffuse atherosclerotic calcification. Mild narrowing of the  great vessel origins. Three vessel branching. No acute finding. Right carotid system: Limited by motion and streak artifact proximally. Mild atherosclerotic plaque at the bifurcation. No stenosis, dissection, or ulceration. Left carotid system: Prominent calcified plaque at the ostium with mild narrowing. Mild plaque at the bifurcation. No spur flow limiting stenosis, dissection, or ulceration. Vertebral arteries: Advanced atherosclerotic disease in the proximal left subclavian artery with moderate stenosis before and after the vertebral artery origin, up to 50%. No flow limiting stenosis in the right subclavian artery. Mild to moderate right vertebral artery origin stenosis. No evidence of dissection. Both vessels are smooth and patent to the dura. Skeleton: No acute or aggressive finding Other neck: Small bilateral thyroid nodules measuring less than 1 cm. Upper chest: Partly visualized pacer and left breast surgery. Nonspecific patchy atelectasis or other ground-glass density in the motion degraded upper lungs. Review of the MIP images confirms the above findings CTA HEAD FINDINGS Limited by venous contamination. Anterior circulation: Symmetric carotid arteries. No major branch occlusion or flow limiting stenosis. Negative for aneurysm. Posterior circulation: Fetal type PCA on the left. Early branching right PICA, extradural. No major branch occlusion or flow limiting stenosis. Negative for aneurysm. Venous sinuses: Patent and unremarkable. Right suboccipital veins have asymmetric delayed enhancement compared to the left, without detected stenosis or clot. Question if this is related to early imaging and dominance of the left transverse venous system. Anatomic variants: Fetal type PCA on the left Delayed phase: No parenchymal enhancement or mass. Review of the MIP images confirms the above findings IMPRESSION: 1. No acute finding.  No evidence of dissection. 2. Advanced atherosclerosis in the proximal left  subclavian artery with up to 50% narrowing. 3. Mild to moderate right vertebral artery ostial stenosis. 4. No carotid or intracranial flow limiting stenosis. Electronically Signed   By: Monte Fantasia M.D.   On: 07/01/2016 16:45    Procedures Procedures (including critical care time)  Medications Ordered in ED Medications  iopamidol (ISOVUE-370) 76 % injection 100 mL (100 mLs Intravenous Contrast Given 07/01/16 1515)  sodium chloride 0.9 % bolus 500 mL (0 mLs Intravenous Stopped 07/01/16 1841)     Initial Impression / Assessment and Plan / ED Course  I have reviewed the triage vital signs and the nursing notes.  Pertinent labs & imaging results that were available during my care of  the patient were reviewed by me and considered in my medical decision making (see chart for details).  Clinical Course      Patient with dizziness and neck pain. Has pacer cannot get MRI. Likely peripheral. MRI MRA reassuring.Has a chronic weakness in her legs is unchanged. Has had previous ear problems. Will discharge home to follow-up with ENT.  Final Clinical Impressions(s) / ED Diagnoses   Final diagnoses:  Vertigo    New Prescriptions Discharge Medication List as of 07/01/2016  6:22 PM    START taking these medications   Details  meclizine (ANTIVERT) 25 MG tablet Take 0.5 tablets (12.5 mg total) by mouth 3 (three) times daily as needed for dizziness., Starting Tue 07/01/2016, Print         Davonna Belling, MD 07/01/16 [phone]

## 2016-07-01 NOTE — ED Notes (Signed)
MD at bedside. 

## 2016-07-04 LAB — URINE CULTURE

## 2016-07-05 ENCOUNTER — Telehealth: Payer: Self-pay

## 2016-07-05 NOTE — Telephone Encounter (Signed)
Post ED Visit - Positive Culture Follow-up  Culture report reviewed by antimicrobial stewardship pharmacist:  []  Elenor Quinones, Pharm.D. []  Heide Guile, Pharm.D., BCPS []  Parks Neptune, Pharm.D. []  Alycia Rossetti, Pharm.D., BCPS []  [place], Pharm.D., BCPS, AAHIVP []  Legrand Como, Pharm.D., BCPS, AAHIVP []  Milus Glazier, Pharm.D. []  Stephens November, [place].D. Benjamine Mola Pharm D Positive urine culture and no further patient follow-up is required at this time.  Genia Del 07/05/2016, 9:40 AM

## 2016-07-10 ENCOUNTER — Ambulatory Visit (HOSPITAL_COMMUNITY)
Admission: RE | Admit: 2016-07-10 | Discharge: 2016-07-10 | Disposition: A | Discharge status: Home / Self Care | Payer: Medicare Other | Source: Ambulatory Visit | Attending: Cardiology | Admitting: Cardiology

## 2016-07-10 DIAGNOSIS — R42 Dizziness and giddiness: Secondary | ICD-10-CM | POA: Insufficient documentation

## 2016-07-13 ENCOUNTER — Encounter: Payer: Self-pay | Admitting: Internal Medicine

## 2016-07-13 DIAGNOSIS — I739 Peripheral vascular disease, unspecified: Secondary | ICD-10-CM

## 2016-07-13 DIAGNOSIS — I779 Disorder of arteries and arterioles, unspecified: Secondary | ICD-10-CM | POA: Insufficient documentation

## 2016-07-15 ENCOUNTER — Encounter: Payer: Medicare Other | Admitting: Internal Medicine

## 2016-07-16 ENCOUNTER — Encounter: Payer: Self-pay | Admitting: Neurology

## 2016-07-16 ENCOUNTER — Ambulatory Visit (INDEPENDENT_AMBULATORY_CARE_PROVIDER_SITE_OTHER): Payer: Medicare Other | Admitting: Neurology

## 2016-07-16 VITALS — BP 100/68 | HR 73 | Wt 168.5 lb

## 2016-07-16 DIAGNOSIS — R292 Abnormal reflex: Secondary | ICD-10-CM | POA: Insufficient documentation

## 2016-07-16 DIAGNOSIS — M48062 Spinal stenosis, lumbar region with neurogenic claudication: Secondary | ICD-10-CM | POA: Diagnosis not present

## 2016-07-16 DIAGNOSIS — R202 Paresthesia of skin: Secondary | ICD-10-CM | POA: Diagnosis not present

## 2016-07-16 MED ORDER — ROLLATOR MISC: 1.0000 [IU] | Freq: Every day | 0 refills | Status: DC

## 2016-07-16 MED ORDER — AMBULATORY NON FORMULARY MEDICATION: 1.0000 [IU] | Freq: Every day | 0 refills | Status: DC

## 2016-07-16 NOTE — Progress Notes (Signed)
Follow-up Visit   Date: 07/16/16    [name] MRN: [id number] DOB: 1935-06-25   Interim History: [name] is a 81 y.o. right-handed Caucasian female with anxiety, GERD, s/p C5-6 ACF, and osteoporesis returning to the clinic for follow-up of husband.  The patient was accompanied to the clinic by husband who also provides collateral information.    History of present illness: Initial visit 03/11/2016 for spells:  Since September 2016, she began having spells of wave of abnormal sensation in her head, feels dazed.  These spells are brief, lasting about 7-10 seconds and occurring sporadically up to several times per hour, but recently are occurring less frequently.  This is associated with bilateral leg tightness and weakness. Her husband says that she is unresponsive with open eyes and has a flat expression.  She denies loss of consciousness and is able to hear him.  They have noticed any triggers or patterns.  When she feels this coming on, she feels as if there is a knot in her throat, neck gets heavy, and legs get weak.  She usually tries to find somewhere to sit or hold onto something.  She does not have leg pain, back pain, or numbness/tingling.  She occasionally has a quiver over the legs.  She walks independently and has not had any falls.  In 2016, she was found to have acute T8 compression fracture and attributes it to doing core exercises at the [facility], because she has not sustained any falls.    She does not have leg pain or tightness independent of these spells.    She has a long history of neck pain radiating to her head, described as spasm and pulling sensation.  She has previously underwent C5-6 ACDF by Dr. Sherwood Gambler.  She was taking Cymbalta for anxiety and noticed that it also helped with her neck discomfort, but has discontinued this due to side effects.  She occasionally has numbness in her left hand.    UPDATE 07/16/2016:  At her last visit, EEG and holter  monitoring was ordered and her Holter showed long sinus pauses and sinus brady for which she underwent pacemaker implantation in October.   She no longer has spells of feeling dazed.  She returns for follow-up of bilateral leg hypersensitivity over the thighs and legs and gait unsteadiness.  Sometimes, she feels unsteady when she is trying to get up too quickly and her legs buckles.  She has not suffered any falls.  She walks independently, but often holds onto her husband for support.  She denies any numbness/tingling of the legs or feet.  She does not have radiating pain down her legs.  The hypersensitivity is usually in associated with low back and neck pain and worse with prolonged walking, standing, and sitting.  She often uses a cart at the grocery store and feels that she is able to walk longer without taking as many breaks.  At church, she is unable to walk far without having to take frequent rest breaks.  After she rests, she feels much better and is able to walk again for a bit.     Medications:  Current Outpatient Prescriptions on File Prior to Visit  Medication Sig Dispense Refill  . aspirin 81 MG tablet Take 81 mg by mouth daily.    . Calcium Carbonate-Vit D-Min (CALTRATE 600+D PLUS MINERALS PO) Take 2 tablets by mouth 2 (two) times daily.     . cholecalciferol (VITAMIN D) 1000 UNITS tablet Take  2,000 Units by mouth daily.     . Cyanocobalamin (RA VITAMIN B-12 TR) 1000 MCG TBCR Take 1 tablet by mouth every other day.     . esomeprazole (NEXIUM) 40 MG capsule Take 1 capsule (40 mg total) by mouth 2 (two) times daily before a meal. 60 capsule 11  . fluticasone (FLONASE) 50 MCG/ACT nasal spray Place 1 spray into both nostrils daily as needed for allergies.     [name] loratadine (CLARITIN) 10 MG tablet Take 10 mg by mouth daily as needed for allergies.    . naproxen sodium (ALEVE) 220 MG tablet Take 220 mg by mouth 2 (two) times daily with a meal.     . polyethylene glycol (MIRALAX / GLYCOLAX)  packet Take 17 g by mouth daily as needed (constipation).     . ranitidine (ZANTAC) 150 MG tablet Take 150 mg by mouth at bedtime as needed for heartburn.    . meclizine (ANTIVERT) 25 MG tablet Take 0.5 tablets (12.5 mg total) by mouth 3 (three) times daily as needed for dizziness. (Patient not taking: Reported on 07/16/2016) 10 tablet 0   No current facility-administered medications on file prior to visit.     Allergies:  Allergies  Allergen Reactions  . Lipitor [Atorvastatin]     Lipitor caused hospitalization - depletion of electrolytes, fever, nausea, loss of appetite, couldn't get out of bed  . Statins     Lipitor caused hospitalization - - depletion of electrolytes, fever, nausea, loss of appetite, couldn't get out of bed  . Cymbalta [Duloxetine Hcl]     Dulled her too much, difficulty urinating, change in vision  . Fosamax [Alendronate Sodium]     Caused chronic issues swallowing  . Gabapentin Other (See Comments)    Dizziness and sedation  . Lyrica [Pregabalin]     Unknown  . Tizanidine Other (See Comments)    Could not sleep    Review of Systems:  CONSTITUTIONAL: No fevers, chills, night sweats, or weight loss.  EYES: No visual changes or eye pain ENT: No hearing changes.  No history of nose bleeds.   RESPIRATORY: No cough, wheezing and shortness of breath.   CARDIOVASCULAR: Negative for chest pain, and palpitations.   GI: Negative for abdominal discomfort, blood in stools or black stools.  No recent change in bowel habits.   GU:  No history of incontinence.   MUSCLOSKELETAL: No history of joint pain or swelling.  No myalgias.   SKIN: Negative for lesions, rash, and itching.   ENDOCRINE: Negative for cold or heat intolerance, polydipsia or goiter.   PSYCH:  No depression or anxiety symptoms.   NEURO: As Above.   Vital Signs:  BP 100/68   Pulse 73   Wt 168 lb 8 oz (76.4 kg)   SpO2 98%   BMI 30.82 kg/m   Neurological Exam: MENTAL STATUS including orientation  to time, place, person, recent and remote memory, attention span and concentration, language, and fund of knowledge is normal.  Speech is not dysarthric.  CRANIAL NERVES: No visual field defects. Pupils equal round and reactive to light.  Normal conjugate, extra-ocular eye movements in all directions of gaze.  No ptosis.  Face is symmetric. Palate elevates symmetrically.  Tongue is midline.  MOTOR:  Motor strength is 5/5 in all extremities.  No atrophy, fasciculations or abnormal movements.  No pronator drift.  Tone is normal.    MSRs:  Right  Left brachioradialis 2+  brachioradialis 2+  biceps 2+  biceps 2+  triceps 2+  triceps 2+  patellar 3+  patellar 3+  ankle jerk 2+  ankle jerk 2+  Hoffman no  Hoffman no  plantar response down  plantar response down   SENSORY:  Intact to temperature, pin prick, and vibration throughout.    COORDINATION/GAIT:  Normal finger-to- nose-finger. She is unable to stand from chair without using arms.  Gait is very unsteady and ataxic, unassisted.  Data: MRI brain wwo contrast 03/20/2016: normal Routine EEG 03/24/2016:  Normal  Holter 04/24/2016:   SR  20 to 95 bpm  Average HR 63 bpm   Rare PAC, PVC Longest pause 7.2 sec at 5:11 AM A few other long pauses, one over 5 seconds in late afternoon   Lab Results  Component Value Date   TSH 2.54 03/17/2016   Lab Results  Component Value Date   VITAMINB12 662 03/17/2016    IMPRESSION/PLAN: 1.  Neurogenic claudication manifesting with bilateral leg hyperesthesias (subjective) and gait ataxia. Her exam shows brisk lower extremity reflexes which suggests more of lumbar canal stenosis, and less likely peripheral neuropathy.  She is unable to get MRI due to PPM, so will obtain CT lumbar spine wo contrast to look for structural changes.  If this does not show canal stenosis, the next step will be NCS/EMG of the legs.  She is high fall risk and I have  counseled her on fall precautions and prescription was given for 4-wheeled rollator.  She will be starting physical therapy for gait training.   2.  BPPV - resolved  3.  Sinus node dysfunction s/p PPM, followed by cardiology.    Return to clinic in 4 months  The duration of this appointment visit was 30 minutes of face-to-face time with the patient.  Greater than 50% of this time was spent in counseling, explanation of diagnosis, planning of further management, and coordination of care.   Thank you for allowing me to participate in patient's care.  If I can answer any additional questions, I would be pleased to do so.    Sincerely,    Jermiyah Ricotta K. Posey Pronto, DO

## 2016-07-16 NOTE — Patient Instructions (Addendum)
CT lumbar spine  Recommend 4 wheeled rollator Physical therapy for gait training  Return to clinic in 4 months  [facility]  Preventing Falls in the [place] are common, often dreaded events in the lives of older people. Aside from the obvious injuries and even death that may result, falls can cause wide-ranging consequences including loss of independence, mental decline, decreased activity, and mobility. Younger people are also at risk of falling, especially those with chronic illnesses and fatigue.  Ways to reduce the risk for falling:  * Examine diet and medications. Warm foods and alcohol dilate blood vessels, which can lead to dizziness when standing. Sleep aids, antidepressants, and pain medications can also increase the likelihood of a fall.  * Get a vison exam. Poor vision, cataracts, and glaucoma increase the chances of falling.  * Check foot gear. Shoes should fit snugly and have a sturdy, nonskid sole and broad, low heel.  * Participate in a physician-approved exercise program to build and maintain muscle strength and improve balance and coordination.  * Increase vitamin D intake. Vitamin D improves muscle strength and increases the amount of calcium the body is able to absorb and deposit in bones.  How to prevent falls from common hazards:  * Floors - Remove all loose wires, cords, and throw rugs. Minimize clutter. Make sure rugs are anchored and smooth. Keep furniture in its usual place.  * Chairs - Use chairs with straight backs, armrests, and firm seats. Add firm cushions to existing pieces to add height.  * Bathroom - Install grab bars and non-skid tape in the tub or shower. Use a bathtub transfer bench or a shower chair with a back support. Use an elevated toilet seat and/or safety rails to assist standing from a low surface. Do not use towel racks or bathroom tissue holders to help you stand.  * Lighting - Make sure halls, stairways, and entrances are well-lit. Install  a night light in your bathroom or hallway. Make sure there is a light switch at the top and bottom of the staircase. Turn lights on if you get up in the middle of the night. Make sure lamps or light switches are within reach of the bed if you have to get up during the night.  * Kitchen - Install non-skid rubber mats near the sink and stove. Clean spills immediately. Store frequently used utensils, pots, and pans between waist and eye level. This helps prevent reaching and bending. Sit when getting things out of the lower cupboards.  * Living room / [place] furniture with wide spaces in between, giving enough room to move around. Establish a route through the living room that gives you something to hold onto as you walk.  * Stairs - Make sure treads, rails, and rugs are secure. Install a rail on both sides of the stairs. If stairs are a threat, it might be helpful to arrange most of your activities on the lower level to reduce the number of times you must climb the stairs.  * Entrances and doorways - Install metal handles on the walls adjacent to the doorknobs of all doors to make it more secure as you travel through the doorway.  Tips for maintaining balance:  * Keep at least one hand free at all times Try using a backpack or fanny pack to hold things rather than carrying them in your hands. Never carry objects in both hands when walking as this interferes with keeping your balance.  *  Attempt to swing both arms from front to back while walking. This might require a conscious effort if Parkinson's disease has diminished your movement. It will, however, help you to maintain balance and posture, and reduce fatigue.  * Consciously lift your feet off the ground when walking. Shuffling and dragging of the feet is a common culprit in losing your balance.  * When trying to navigate turns, use a "U" technique of facing forward and making a wide turn, rather than pivoting sharply.  * Try to stand with your  feet shoulder-length apart. When your feet are close together for any length of time, you increase your risk of losing your balance and falling.  * Do one thing at a time. Do not try to walk and accomplish another task, such as reading or looking around. The decrease in your automatic reflexes complicates motor function, so the less distraction, the better.  * Do not wear rubber or gripping soled shoes, they might "catch" on the floor and cause tripping.  * Move slowly when changing positions. Use deliberate, concentrated movements and, if needed, use a grab bar or walking aid. Count fifteen (15) seconds after standing to begin walking.  * If balance is a continuous problem, you might want to consider a walking aid such as a cane, walking stick, or walker. Once you have mastered walking with help, you may be ready to try it again on your own.  This information is provided by [facility] and is not intended to replace the medical advice of your physician or other health care providers. Please consult your physician or other health care providers for advice regarding your specific medical condition.

## 2016-07-16 NOTE — Addendum Note (Signed)
Addended by: Chester Holstein on: 07/16/2016 02:06 PM   Modules accepted: Orders

## 2016-07-17 ENCOUNTER — Ambulatory Visit
Admission: RE | Admit: 2016-07-17 | Discharge: 2016-07-17 | Disposition: A | Discharge status: Home / Self Care | Payer: Medicare Other | Source: Ambulatory Visit | Attending: Neurology | Admitting: Neurology

## 2016-07-17 ENCOUNTER — Other Ambulatory Visit: Payer: Self-pay | Admitting: *Deleted

## 2016-07-17 DIAGNOSIS — M48062 Spinal stenosis, lumbar region with neurogenic claudication: Secondary | ICD-10-CM

## 2016-07-17 DIAGNOSIS — R292 Abnormal reflex: Secondary | ICD-10-CM

## 2016-07-17 DIAGNOSIS — M5126 Other intervertebral disc displacement, lumbar region: Secondary | ICD-10-CM | POA: Diagnosis not present

## 2016-07-17 DIAGNOSIS — R202 Paresthesia of skin: Secondary | ICD-10-CM

## 2016-07-17 MED ORDER — ROLLATOR MISC: 1.0000 [IU] | Freq: Every day | 0 refills | Status: DC

## 2016-07-23 ENCOUNTER — Ambulatory Visit (INDEPENDENT_AMBULATORY_CARE_PROVIDER_SITE_OTHER): Payer: Medicare Other | Admitting: Internal Medicine

## 2016-07-23 ENCOUNTER — Encounter: Payer: Self-pay | Admitting: Internal Medicine

## 2016-07-23 ENCOUNTER — Telehealth: Payer: Self-pay | Admitting: Emergency Medicine

## 2016-07-23 VITALS — BP 136/60 | HR 77 | Temp 98.0°F | Resp 16 | Wt 169.0 lb

## 2016-07-23 DIAGNOSIS — R29898 Other symptoms and signs involving the musculoskeletal system: Secondary | ICD-10-CM | POA: Diagnosis not present

## 2016-07-23 MED ORDER — RANITIDINE HCL 150 MG PO TABS: 150.0000 mg | ORAL_TABLET | Freq: Every evening | ORAL | 3 refills | Status: DC | PRN

## 2016-07-23 NOTE — Patient Instructions (Addendum)
Follow up with your orthopedic.     Medications reviewed and updated.  No changes recommended at this time.   Please followup in 4 months

## 2016-07-23 NOTE — Progress Notes (Signed)
Subjective:    Patient ID: [name], female    DOB: 10-Sep-1934, 81 y.o.   MRN: [id number]  HPI The patient is here for follow up.  Bilateral leg pain and weakness: She saw Dr Posey Pronto last week and her exam was suggestive of neurogenic claudication from possible lumbar canal stenosis, less likely peripheral neuropathy.  She had a CT of her lumbar spine and is schedule for an EMG next month.  She was prescribed a 4-wheeled rollator to help prevent falls and will be starting PT next week.    Her knees give way at times.  She has not had her orthopedic evaluate her knees.  She is unsure if she has arthritis.   Dizziness:  She has occasional dizziness that is related to head movements.  She has not had any dizziness recently.   Chronic neck pain:  She has intermittent tingling in her left arm.  Has seen Dr Sherwood Gambler.      Medications and allergies reviewed with patient and updated if appropriate.  Patient Active Problem List   Diagnosis Date Noted  . Hyperreflexia of lower extremity 07/16/2016  . Bilateral leg paresthesia 07/16/2016  . Carotid arterial disease ([place]) 07/13/2016  . Undiagnosed cardiac murmurs 06/23/2016  . Poor balance 06/23/2016  . Lightheadedness 06/23/2016  . Weakness of both lower extremities 06/23/2016  . Sinus node dysfunction ([place]) 05/05/2016  . Right shoulder pain 03/14/2016  . Multinodular goiter (nontoxic) 02/21/2016  . Neck pain 02/15/2016  . Pain in limb 02/15/2016  . Neck nodule 02/15/2016  . Prediabetes 12/17/2015  . Dysphagia 12/17/2015  . Anxiety 11/13/2015  . T8 vertebral fracture ([place]) 04/09/2015  . Overweight 10/09/2011  . Allergic rhinitis, cause unspecified   . GERD (gastroesophageal reflux disease) 02/05/2011  . EPISTAXIS 07/23/2010  . FATIGUE 09/28/2009  . Vitamin B 12 deficiency 04/17/2009  . Hyperlipidemia 04/17/2009  . Osteoporosis 04/17/2009  . ADENOCARCINOMA, BREAST, HX OF 04/17/2009  . CONSTIPATION 03/08/2009  . COLONIC  POLYPS, HYPERPLASTIC, HX OF 03/06/2009    Current Outpatient Prescriptions on File Prior to Visit  Medication Sig Dispense Refill  . AMBULATORY NON FORMULARY MEDICATION 1 Units by Other route daily. Rollator 1 Units 0  . aspirin 81 MG tablet Take 81 mg by mouth daily.    . Calcium Carbonate-Vit D-Min (CALTRATE 600+D PLUS MINERALS PO) Take 2 tablets by mouth 2 (two) times daily.     . cholecalciferol (VITAMIN D) 1000 UNITS tablet Take 2,000 Units by mouth daily.     . Cyanocobalamin (RA VITAMIN B-12 TR) 1000 MCG TBCR Take 1 tablet by mouth every other day.     . esomeprazole (NEXIUM) 40 MG capsule Take 1 capsule (40 mg total) by mouth 2 (two) times daily before a meal. 60 capsule 11  . fluticasone (FLONASE) 50 MCG/ACT nasal spray Place 1 spray into both nostrils daily as needed for allergies.     [name] loratadine (CLARITIN) 10 MG tablet Take 10 mg by mouth daily as needed for allergies.    . Misc. Devices (ROLLATOR) MISC 1 Units by Does not apply route daily. 1 each 0  . naproxen sodium (ALEVE) 220 MG tablet Take 220 mg by mouth 2 (two) times daily with a meal.     . polyethylene glycol (MIRALAX / GLYCOLAX) packet Take 17 g by mouth daily as needed (constipation).      No current facility-administered medications on file prior to visit.     Past Medical History:  Diagnosis Date  .  Cancer of left breast ([place]) 1978  . Chronic thoracic back pain    "T8; fracture; 03/2015; no OR" (05/05/2016)  . GERD (gastroesophageal reflux disease)   . Heart murmur   . History of hiatal hernia   . Hyperlipidemia   . IBS (irritable bowel syndrome)   . Multiple thyroid nodules   . Osteoporosis    T8 compression fx 03/2015   . Osteoporosis   . Presence of permanent cardiac pacemaker   . Vitamin B12 deficiency     Past Surgical History:  Procedure Laterality Date  . ANTERIOR CERVICAL DECOMP/DISCECTOMY FUSION  2008   C5-6  . BACK SURGERY    . BREAST SURGERY    . CARDIAC CATHETERIZATION  2001  . EP  IMPLANTABLE DEVICE N/A 05/05/2016   Procedure: Pacemaker Implant;  Surgeon: Evans Lance, MD;  Location: [facility];  Service: Cardiovascular;  Laterality: N/A;  . EXCISIONAL HEMORRHOIDECTOMY  1984   With subsequent correction of surgery  . INSERT / REPLACE / REMOVE PACEMAKER    . KNEE ARTHROSCOPY Left    "meniscus tear"  . MASTECTOMY Left 1978  . PLACEMENT OF BREAST IMPLANTS Left 1981  . SHOULDER ARTHROSCOPY W/ ROTATOR CUFF REPAIR Left 02/2006   "tear"  . TUBAL LIGATION      Social History   Social History  . Marital status: Married    Spouse name: N/A  . Number of children: N/A  . Years of education: N/A   Social History Main Topics  . Smoking status: Never Smoker  . Smokeless tobacco: Never Used  . Alcohol use No  . Drug use: No  . Sexual activity: Not Asked   Other Topics Concern  . None   Social History Narrative   Housewife, Lives with spouse, 2 children    Family History  Problem Relation Age of Onset  . Diabetes Sister   . Diabetes Brother   . Breast cancer Maternal Aunt   . Colon cancer Neg Hx     Review of Systems  Constitutional: Negative for fever.  Respiratory: Positive for shortness of breath (occasional). Negative for cough and wheezing.        Occasionally needs to take an extra breath  Cardiovascular: Negative for chest pain, palpitations and leg swelling.  Musculoskeletal: Positive for arthralgias (occasional pin in her knee).       Hypersensitive back of knees to feet  Neurological: Positive for weakness (leg, knees). Negative for dizziness and numbness.       Objective:   Vitals:   07/23/16 0851  BP: 136/60  Pulse: 77  Resp: 16  Temp: 98 F (36.7 C)   Filed Weights   07/23/16 0851  Weight: 169 lb (76.7 kg)   Body mass index is 30.91 kg/m.   Physical Exam    Constitutional: Appears well-developed and well-nourished. No distress.  HENT:  Head: Normocephalic and atraumatic.  Neck: Neck supple. No tracheal deviation  present. No thyromegaly present.  No cervical lymphadenopathy Cardiovascular: Normal rate, regular rhythm and normal heart sounds.   No murmur heard. No carotid bruit .  No edema Pulmonary/Chest: Effort normal and breath sounds normal. No respiratory distress. No has no wheezes. No rales.  Skin: Skin is warm and dry. Not diaphoretic.  Psychiatric: Normal mood and affect. Behavior is normal.     Assessment & Plan:    See Problem List for Assessment and Plan of chronic medical problems.

## 2016-07-23 NOTE — Assessment & Plan Note (Signed)
Following with Dr Posey Pronto CT of lumbar spine showed multilevel DDD, no spinal stenosis, some foraminal narrowing Will have EMG next month Will start PT next month ? Knee OA or pathology contributing - will see her orthopedic for evaluation of her knees Stressed doing PT exercises at home and increase activity

## 2016-07-23 NOTE — Telephone Encounter (Signed)
Left message advising patient that prolia injection is due---insurance has been verified and estimated $0 copay is due for injection---patient can schedule nurse visit on/after dec 13th to get injection---any questions, can talk with tamara

## 2016-07-23 NOTE — Progress Notes (Signed)
Pre visit review using our clinic review tool, if applicable. No additional management support is needed unless otherwise documented below in the visit note. 

## 2016-07-23 NOTE — Telephone Encounter (Signed)
Pt was seen in office today, asking for a follow-up from you on Prolia, last injection was June of 2017. Please advise.

## 2016-07-24 ENCOUNTER — Ambulatory Visit (INDEPENDENT_AMBULATORY_CARE_PROVIDER_SITE_OTHER): Payer: Medicare Other | Admitting: General Practice

## 2016-07-24 DIAGNOSIS — M8000XD Age-related osteoporosis with current pathological fracture, unspecified site, subsequent encounter for fracture with routine healing: Secondary | ICD-10-CM

## 2016-07-24 MED ORDER — DENOSUMAB 60 MG/ML ~~LOC~~ SOLN
60.0000 mg | Freq: Once | SUBCUTANEOUS | Status: AC
  Administered 2016-07-24: 60 mg via SUBCUTANEOUS

## 2016-07-24 NOTE — Progress Notes (Signed)
prolia given.   [name], MD

## 2016-07-30 DIAGNOSIS — M25562 Pain in left knee: Secondary | ICD-10-CM | POA: Diagnosis not present

## 2016-07-30 DIAGNOSIS — R29898 Other symptoms and signs involving the musculoskeletal system: Secondary | ICD-10-CM | POA: Diagnosis not present

## 2016-07-30 DIAGNOSIS — M25561 Pain in right knee: Secondary | ICD-10-CM | POA: Diagnosis not present

## 2016-08-04 ENCOUNTER — Telehealth: Payer: Self-pay | Admitting: Internal Medicine

## 2016-08-04 NOTE — Telephone Encounter (Signed)
Called patient to schedule awv appt. Left msg for patient to call office to schedule appt.

## 2016-08-04 NOTE — Telephone Encounter (Signed)
Spoke with patient in regards to scheduling her annual wellness visit. Patient stated that she would like to schedule her awv on the same day as her f/u visit with Dr. Quay Burow. Informed patient that she will receive a call one the health coach's schedule has been opened up for 4/27.

## 2016-08-05 ENCOUNTER — Ambulatory Visit (INDEPENDENT_AMBULATORY_CARE_PROVIDER_SITE_OTHER): Payer: Medicare Other | Admitting: Internal Medicine

## 2016-08-05 ENCOUNTER — Encounter: Payer: Self-pay | Admitting: Internal Medicine

## 2016-08-05 ENCOUNTER — Encounter (INDEPENDENT_AMBULATORY_CARE_PROVIDER_SITE_OTHER): Payer: Self-pay

## 2016-08-05 ENCOUNTER — Encounter: Payer: Medicare Other | Admitting: Internal Medicine

## 2016-08-05 VITALS — BP 158/70 | HR 72 | Ht 62.0 in | Wt 171.2 lb

## 2016-08-05 DIAGNOSIS — I495 Sick sinus syndrome: Secondary | ICD-10-CM | POA: Diagnosis not present

## 2016-08-05 NOTE — Progress Notes (Signed)
HPI [name] returns today for ongoing PPM followup. She is a pleasant elderly woman with a h/o Breast CA, s/p mastectomy, who has undergone reconstruction. She developed symptomatic sinus node dysfunction with pauses of up to 7 seconds. She has done well since her PPM was placed but was anxious about restarting to exercise. She has not had syncope. She does admit to being able to walk a mile in 14 minutes!  Allergies  Allergen Reactions  . Lipitor [Atorvastatin]     Lipitor caused hospitalization - depletion of electrolytes, fever, nausea, loss of appetite, couldn't get out of bed  . Statins     Lipitor caused hospitalization - - depletion of electrolytes, fever, nausea, loss of appetite, couldn't get out of bed  . Cymbalta [Duloxetine Hcl]     Dulled her too much, difficulty urinating, change in vision  . Fosamax [Alendronate Sodium]     Caused chronic issues swallowing  . Gabapentin Other (See Comments)    Dizziness and sedation  . Lyrica [Pregabalin]     Unknown  . Tizanidine Other (See Comments)    Could not sleep     Current Outpatient Prescriptions  Medication Sig Dispense Refill  . AMBULATORY NON FORMULARY MEDICATION 1 Units by Other route daily. Rollator 1 Units 0  . aspirin 81 MG tablet Take 81 mg by mouth daily.    . Calcium Carbonate-Vit D-Min (CALTRATE 600+D PLUS MINERALS PO) Take 2 tablets by mouth 2 (two) times daily.     . cholecalciferol (VITAMIN D) 1000 UNITS tablet Take 2,000 Units by mouth daily.     . Cyanocobalamin (RA VITAMIN B-12 TR) 1000 MCG TBCR Take 1 tablet by mouth every other day.     . esomeprazole (NEXIUM) 40 MG capsule Take 1 capsule (40 mg total) by mouth 2 (two) times daily before a meal. 60 capsule 11  . fluticasone (FLONASE) 50 MCG/ACT nasal spray Place 1 spray into both nostrils daily as needed for allergies.     [name] loratadine (CLARITIN) 10 MG tablet Take 10 mg by mouth daily as needed for allergies.    . Misc. Devices (ROLLATOR) MISC  1 Units by Does not apply route daily. 1 each 0  . naproxen sodium (ALEVE) 220 MG tablet Take 220 mg by mouth 2 (two) times daily with a meal.     . polyethylene glycol (MIRALAX / GLYCOLAX) packet Take 17 g by mouth daily as needed (constipation).     . ranitidine (ZANTAC) 150 MG tablet Take 1 tablet (150 mg total) by mouth at bedtime as needed for heartburn. 90 tablet 3   No current facility-administered medications for this visit.      Past Medical History:  Diagnosis Date  . Cancer of left breast ([place]) 1978  . Chronic thoracic back pain    "T8; fracture; 03/2015; no OR" (05/05/2016)  . GERD (gastroesophageal reflux disease)   . Heart murmur   . History of hiatal hernia   . Hyperlipidemia   . IBS (irritable bowel syndrome)   . Multiple thyroid nodules   . Osteoporosis    T8 compression fx 03/2015   . Osteoporosis   . Presence of permanent cardiac pacemaker   . Vitamin B12 deficiency     ROS:   All systems reviewed and negative except as noted in the HPI.   Past Surgical History:  Procedure Laterality Date  . ANTERIOR CERVICAL DECOMP/DISCECTOMY FUSION  2008   C5-6  . BACK SURGERY    .  BREAST SURGERY    . CARDIAC CATHETERIZATION  2001  . EP IMPLANTABLE DEVICE N/A 05/05/2016   Procedure: Pacemaker Implant;  Surgeon: Evans Lance, MD;  Location: [facility];  Service: Cardiovascular;  Laterality: N/A;  . EXCISIONAL HEMORRHOIDECTOMY  1984   With subsequent correction of surgery  . INSERT / REPLACE / REMOVE PACEMAKER    . KNEE ARTHROSCOPY Left    "meniscus tear"  . MASTECTOMY Left 1978  . PLACEMENT OF BREAST IMPLANTS Left 1981  . SHOULDER ARTHROSCOPY W/ ROTATOR CUFF REPAIR Left 02/2006   "tear"  . TUBAL LIGATION       Family History  Problem Relation Age of Onset  . Diabetes Sister   . Diabetes Brother   . Breast cancer Maternal Aunt   . Colon cancer Neg Hx      Social History   Social History  . Marital status: Married    Spouse name: N/A  .  Number of children: N/A  . Years of education: N/A   Occupational History  . Not on file.   Social History Main Topics  . Smoking status: Never Smoker  . Smokeless tobacco: Never Used  . Alcohol use No  . Drug use: No  . Sexual activity: Not on file   Other Topics Concern  . Not on file   Social History Narrative   Housewife, Lives with spouse, 2 children     BP (!) 158/70 (BP Location: Right Arm)   Pulse 72   Ht 5\' 2"  (1.575 m)   Wt 171 lb 3.2 oz (77.7 kg)   BMI 31.31 kg/m   Physical Exam:  Well appearing 82 yo woman, NAD HEENT: Unremarkable Neck:  No JVD, no thyromegally Lymphatics:  No adenopathy Back:  No CVA tenderness Lungs:  Clear with no wheezes. HEART:  Regular rate rhythm, no murmurs, no rubs, no clicks Abd:  soft, positive bowel sounds, no organomegally, no rebound, no guarding Ext:  2 plus pulses, no edema, no cyanosis, no clubbing Skin:  No rashes no nodules Neuro:  CN II through XII intact, motor grossly intact  EKG - NSR with poor r wave progression  DEVICE  Normal device function.  See PaceArt for details.   Assess/Plan: 1. Sinus node dysfunction - she is now asymptomatic, s/p PPM insertion.  2. Dyslipidemia - she will maintain a low fat diet. 3. PPM - her [employer] DDD PM is working normally. She will recheck in several months. 4. Fatigue - this has improved. I have strongly encouraged the patient to increase her physical activity.  Mikle Bosworth.D.

## 2016-08-05 NOTE — Patient Instructions (Addendum)
Medication Instructions:  Your physician recommends that you continue on your current medications as directed. Please refer to the Current Medication list given to you today.   Labwork: None Ordered   Testing/Procedures: None Ordered   Follow-Up: Your physician wants you to follow-up in: 1 year with Dr. Lovena Le. You will receive a reminder letter in the mail two months in advance. If you don't receive a letter, please call our office to schedule the follow-up appointment.  Remote monitoring is used to monitor your Pacemaker from home. This monitoring reduces the number of office visits required to check your device to one time per year. It allows [place] to keep an eye on the functioning of your device to ensure it is working properly. You are scheduled for a device check from home on 11/04/16. You may send your transmission at any time that day. If you have a wireless device, the transmission will be sent automatically. After your physician reviews your transmission, you will receive a postcard with your next transmission date.    Any Other Special Instructions Will Be Listed Below (If Applicable).     If you need a refill on your cardiac medications before your next appointment, please call your pharmacy.

## 2016-08-06 LAB — CUP PACEART INCLINIC DEVICE CHECK
Implantable Lead Implant Date: 20171009
Implantable Lead Location: 753860
Lead Channel Pacing Threshold Amplitude: 0.5 V
Lead Channel Pacing Threshold Pulse Width: 0.4 ms
Lead Channel Sensing Intrinsic Amplitude: 12.5 mV
Lead Channel Sensing Intrinsic Amplitude: 2.7 mV
Lead Channel Setting Pacing Amplitude: 2.5 V
Lead Channel Setting Pacing Pulse Width: 0.4 ms
MDC IDC LEAD IMPLANT DT: 20171009
MDC IDC LEAD LOCATION: 753859
MDC IDC LEAD SERIAL: 785430
MDC IDC MSMT LEADCHNL RA IMPEDANCE VALUE: 439 Ohm
MDC IDC MSMT LEADCHNL RA PACING THRESHOLD PULSEWIDTH: 1 ms
MDC IDC MSMT LEADCHNL RV IMPEDANCE VALUE: 1192 Ohm
MDC IDC MSMT LEADCHNL RV PACING THRESHOLD AMPLITUDE: 0.4 V
MDC IDC PG IMPLANT DT: 20171009
MDC IDC PG SERIAL: 766467
MDC IDC SESS DTM: 20180109050000
MDC IDC SET LEADCHNL RA PACING AMPLITUDE: 2 V
MDC IDC SET LEADCHNL RV SENSING SENSITIVITY: 2.5 mV

## 2016-08-11 DIAGNOSIS — R29898 Other symptoms and signs involving the musculoskeletal system: Secondary | ICD-10-CM | POA: Diagnosis not present

## 2016-08-18 DIAGNOSIS — R29898 Other symptoms and signs involving the musculoskeletal system: Secondary | ICD-10-CM | POA: Diagnosis not present

## 2016-08-20 ENCOUNTER — Telehealth: Payer: Self-pay | Admitting: Gastroenterology

## 2016-08-20 NOTE — Telephone Encounter (Signed)
Left message for patient to call back  

## 2016-08-20 NOTE — Telephone Encounter (Signed)
Returned [facility]'s call. [name] states the Nexium bid dosing is under clinical review and I need to answer a few clinical questions before it can be approved. Answered clinical questions and they approved Nexium 40 mg twice daily for a year. They will fax notification of approval.

## 2016-08-21 ENCOUNTER — Encounter (INDEPENDENT_AMBULATORY_CARE_PROVIDER_SITE_OTHER): Payer: Self-pay

## 2016-08-21 ENCOUNTER — Encounter: Payer: Self-pay | Admitting: Gastroenterology

## 2016-08-21 ENCOUNTER — Ambulatory Visit (INDEPENDENT_AMBULATORY_CARE_PROVIDER_SITE_OTHER): Payer: Medicare Other | Admitting: Gastroenterology

## 2016-08-21 VITALS — BP 130/60 | HR 68 | Ht 61.5 in | Wt 168.4 lb

## 2016-08-21 DIAGNOSIS — R1314 Dysphagia, pharyngoesophageal phase: Secondary | ICD-10-CM | POA: Diagnosis not present

## 2016-08-21 DIAGNOSIS — K219 Gastro-esophageal reflux disease without esophagitis: Secondary | ICD-10-CM | POA: Diagnosis not present

## 2016-08-21 DIAGNOSIS — Z1211 Encounter for screening for malignant neoplasm of colon: Secondary | ICD-10-CM | POA: Diagnosis not present

## 2016-08-21 DIAGNOSIS — K5900 Constipation, unspecified: Secondary | ICD-10-CM

## 2016-08-21 DIAGNOSIS — Z1212 Encounter for screening for malignant neoplasm of rectum: Secondary | ICD-10-CM

## 2016-08-21 DIAGNOSIS — R29898 Other symptoms and signs involving the musculoskeletal system: Secondary | ICD-10-CM | POA: Diagnosis not present

## 2016-08-21 NOTE — Progress Notes (Signed)
History of Present Illness: This is a 82 year old female returning for follow-up of GERD and constipation. She reports her reflux symptoms are under much better control on her current regimen. She occasionally notes some regurgitation and throat soreness. She states she has intermittent difficulty swallowing liquids and solids occasionally associated with coughing. Barium esophagram in August 2017 was normal. She has ongoing difficulties with constipation which has improved with the regular use of MiraLAX. She is 10 years out from her last screening colonoscopy is considering a repeat colonoscopy.    Allergies  Allergen Reactions  . Lipitor [Atorvastatin]     Lipitor caused hospitalization - depletion of electrolytes, fever, nausea, loss of appetite, couldn't get out of bed  . Statins     Lipitor caused hospitalization - - depletion of electrolytes, fever, nausea, loss of appetite, couldn't get out of bed  . Cymbalta [Duloxetine Hcl]     Dulled her too much, difficulty urinating, change in vision  . Fosamax [Alendronate Sodium]     Caused chronic issues swallowing  . Gabapentin Other (See Comments)    Dizziness and sedation  . Lyrica [Pregabalin]     Unknown  . Tizanidine Other (See Comments)    Could not sleep   Outpatient Medications Prior to Visit  Medication Sig Dispense Refill  . AMBULATORY NON FORMULARY MEDICATION 1 Units by Other route daily. Rollator 1 Units 0  . aspirin 81 MG tablet Take 81 mg by mouth daily.    . Calcium Carbonate-Vit D-Min (CALTRATE 600+D PLUS MINERALS PO) Take 2 tablets by mouth 2 (two) times daily.     . cholecalciferol (VITAMIN D) 1000 UNITS tablet Take 2,000 Units by mouth daily.     . Cyanocobalamin (RA VITAMIN B-12 TR) 1000 MCG TBCR Take 1 tablet by mouth every other day.     . esomeprazole (NEXIUM) 40 MG capsule Take 1 capsule (40 mg total) by mouth 2 (two) times daily before a meal. 60 capsule 11  . fluticasone (FLONASE) 50 MCG/ACT nasal spray  Place 1 spray into both nostrils daily as needed for allergies.     [name] loratadine (CLARITIN) 10 MG tablet Take 10 mg by mouth daily as needed for allergies.    . naproxen sodium (ALEVE) 220 MG tablet Take 220 mg by mouth 2 (two) times daily with a meal.     . polyethylene glycol (MIRALAX / GLYCOLAX) packet Take 17 g by mouth daily as needed (constipation).     . ranitidine (ZANTAC) 150 MG tablet Take 1 tablet (150 mg total) by mouth at bedtime as needed for heartburn. 90 tablet 3  . Misc. Devices (ROLLATOR) MISC 1 Units by Does not apply route daily. 1 each 0   No facility-administered medications prior to visit.    Past Medical History:  Diagnosis Date  . Cancer of left breast ([place]) 1978  . Chronic thoracic back pain    "T8; fracture; 03/2015; no OR" (05/05/2016)  . GERD (gastroesophageal reflux disease)   . Heart murmur   . History of hiatal hernia   . Hyperlipidemia   . Hyperplastic colon polyp   . IBS (irritable bowel syndrome)   . Multiple thyroid nodules   . Osteoporosis    T8 compression fx 03/2015   . Presence of permanent cardiac pacemaker   . Vitamin B12 deficiency    Past Surgical History:  Procedure Laterality Date  . ANTERIOR CERVICAL DECOMP/DISCECTOMY FUSION  2008   C5-6  . BACK SURGERY    .  BREAST SURGERY    . CARDIAC CATHETERIZATION  2001  . EP IMPLANTABLE DEVICE N/A 05/05/2016   Procedure: Pacemaker Implant;  Surgeon: Evans Lance, MD;  Location: [facility];  Service: Cardiovascular;  Laterality: N/A;  . EXCISIONAL HEMORRHOIDECTOMY  1984   With subsequent correction of surgery  . INSERT / REPLACE / REMOVE PACEMAKER    . KNEE ARTHROSCOPY Left    "meniscus tear"  . MASTECTOMY Left 1978  . PLACEMENT OF BREAST IMPLANTS Left 1981  . SHOULDER ARTHROSCOPY W/ ROTATOR CUFF REPAIR Left 02/2006   "tear"  . TUBAL LIGATION     Social History   Social History  . Marital status: Married    Spouse name: N/A  . Number of children: N/A  . Years of education:  N/A   Social History Main Topics  . Smoking status: Never Smoker  . Smokeless tobacco: Never Used  . Alcohol use No  . Drug use: No  . Sexual activity: Not Asked   Other Topics Concern  . None   Social History Narrative   Housewife, Lives with spouse, 2 children   Family History  Problem Relation Age of Onset  . Diabetes Sister   . Diabetes Brother   . Breast cancer Maternal Aunt   . Colon cancer Neg Hx       Physical Exam: General: Well developed, well nourished, no acute distress Head: Normocephalic and atraumatic Eyes:  sclerae anicteric, EOMI Ears: Normal auditory acuity Mouth: No deformity or lesions Lungs: Clear throughout to auscultation Heart: Regular rate and rhythm; no murmurs, rubs or bruits Abdomen: Soft, non tender and non distended. No masses, hepatosplenomegaly or hernias noted. Normal Bowel sounds Musculoskeletal: Symmetrical with no gross deformities  Pulses:  Normal pulses noted Extremities: No clubbing, cyanosis, edema or deformities noted Neurological: Alert oriented x 4, grossly nonfocal Psychological:  Alert and cooperative. Normal mood and affect  Assessment and Recommendations:  1. GERD with LPR. Intermittent solid and liquid dysphagia with choking and coughing may be oropharyngeal in etiology. Schedule a modified barium swallow study. Continue Nexium 40 mg twice daily and ranitidine 150 mg at bedtime.  2. Chronic constipation. Increase MiraLAX to once or twice daily titrated for adequate bowel movements.  3. CRC screening, average risk. At age 62 she is considering the option of colonoscopy. It seems equally reasonable to proceed or to discontinue screening given her age. She will further consider this make her decision in the near future.

## 2016-08-21 NOTE — Patient Instructions (Signed)
Increase your Miralax to 1-2 x daily to titrate depending on your bowel movements.    You have been scheduled for a modified barium swallow on 09/01/16 at 1:00pm. Please arrive 15 minutes prior to your test for registration. You will go to [facility] (1st Floor) for your appointment. Please refrain from eating or drinking anything 4 hours prior to your test. Should you need to cancel or reschedule your appointment, please contact [phone] [facility]) or [phone] [facility]). _____________________________________________________________________ A Modified Barium Swallow Study, or MBS, is a special x-ray that is taken to check swallowing skills. It is carried out by a Stage manager and a Psychologist, clinical (SLP). During this test, yourmouth, throat, and esophagus, a muscular tube which connects your mouth to your stomach, is checked. The test will help you, your doctor, and the SLP plan what types of foods and liquids are easier for you to swallow. The SLP will also identify positions and ways to help you swallow more easily and safely. What will happen during an MBS? You will be taken to an x-ray room and seated comfortably. You will be asked to swallow small amounts of food and liquid mixed with barium. Barium is a liquid or paste that allows images of your mouth, throat and esophagus to be seen on x-ray. The x-ray captures moving images of the food you are swallowing as it travels from your mouth through your throat and into your esophagus. This test helps identify whether food or liquid is entering your lungs (aspiration). The test also shows which part of your mouth or throat lacks strength or coordination to move the food or liquid in the right direction. This test typically takes 30 minutes to 1 hour to complete. _______________________________________________________________________  If you decide you want to schedule your Colonoscopy, please call our office  back.  Thank you for choosing me and [facility].  Pricilla Riffle. Dagoberto Ligas., MD., [facility] Regal

## 2016-08-21 NOTE — Telephone Encounter (Signed)
Late entry  Patient was seen in the office this am for dysphagia and to discuss colonoscopy

## 2016-08-22 ENCOUNTER — Other Ambulatory Visit (HOSPITAL_COMMUNITY): Payer: Self-pay | Admitting: Gastroenterology

## 2016-08-22 DIAGNOSIS — R1319 Other dysphagia: Secondary | ICD-10-CM

## 2016-08-25 DIAGNOSIS — R29898 Other symptoms and signs involving the musculoskeletal system: Secondary | ICD-10-CM | POA: Diagnosis not present

## 2016-08-28 DIAGNOSIS — R29898 Other symptoms and signs involving the musculoskeletal system: Secondary | ICD-10-CM | POA: Diagnosis not present

## 2016-09-01 ENCOUNTER — Ambulatory Visit (HOSPITAL_COMMUNITY)
Admission: RE | Admit: 2016-09-01 | Discharge: 2016-09-01 | Disposition: A | Discharge status: Home / Self Care | Payer: Medicare Other | Source: Ambulatory Visit | Attending: Gastroenterology | Admitting: Gastroenterology

## 2016-09-01 DIAGNOSIS — Z950 Presence of cardiac pacemaker: Secondary | ICD-10-CM | POA: Insufficient documentation

## 2016-09-01 DIAGNOSIS — Z9012 Acquired absence of left breast and nipple: Secondary | ICD-10-CM | POA: Diagnosis not present

## 2016-09-01 DIAGNOSIS — R011 Cardiac murmur, unspecified: Secondary | ICD-10-CM | POA: Diagnosis not present

## 2016-09-01 DIAGNOSIS — G8929 Other chronic pain: Secondary | ICD-10-CM | POA: Diagnosis not present

## 2016-09-01 DIAGNOSIS — Z86010 Personal history of colonic polyps: Secondary | ICD-10-CM | POA: Insufficient documentation

## 2016-09-01 DIAGNOSIS — E785 Hyperlipidemia, unspecified: Secondary | ICD-10-CM | POA: Insufficient documentation

## 2016-09-01 DIAGNOSIS — E538 Deficiency of other specified B group vitamins: Secondary | ICD-10-CM | POA: Diagnosis not present

## 2016-09-01 DIAGNOSIS — M546 Pain in thoracic spine: Secondary | ICD-10-CM | POA: Diagnosis not present

## 2016-09-01 DIAGNOSIS — Z853 Personal history of malignant neoplasm of breast: Secondary | ICD-10-CM | POA: Insufficient documentation

## 2016-09-01 DIAGNOSIS — R1319 Other dysphagia: Secondary | ICD-10-CM | POA: Insufficient documentation

## 2016-09-01 DIAGNOSIS — R1310 Dysphagia, unspecified: Secondary | ICD-10-CM | POA: Diagnosis not present

## 2016-09-01 DIAGNOSIS — K219 Gastro-esophageal reflux disease without esophagitis: Secondary | ICD-10-CM | POA: Diagnosis not present

## 2016-09-01 DIAGNOSIS — M810 Age-related osteoporosis without current pathological fracture: Secondary | ICD-10-CM | POA: Diagnosis not present

## 2016-09-01 DIAGNOSIS — R1314 Dysphagia, pharyngoesophageal phase: Secondary | ICD-10-CM

## 2016-09-01 DIAGNOSIS — K589 Irritable bowel syndrome without diarrhea: Secondary | ICD-10-CM | POA: Diagnosis not present

## 2016-10-03 ENCOUNTER — Telehealth: Payer: Self-pay | Admitting: Cardiology

## 2016-10-03 NOTE — Telephone Encounter (Signed)
Reviewed manual transmission, which shows no alerts or abnormalities.  Presenting rhythm As/Vs ~90bpm.  Histograms appropriate.  Advised patient that there were no abnormalities found on her PPM.  She denies ShOB, weakness, palpitations, chest discomfort, or dizziness at this time.  Advised patient to follow-up with her PCP as leg pain and ShOB are often related to things other than her heart rhythm.  Patient verbalizes understanding of instructions.  She states she will be back in town in April and will see her PCP then.  Patient agrees to call with worsening symptoms, questions, or concerns.  She denies additional questions at this time.

## 2016-10-03 NOTE — Telephone Encounter (Signed)
Patient called and stated that when she was walking home from the park her legs felt like they were going to give out on her, she had some shortness of breath, and lots of leg pain. Informed pt that her remote transmission was received and I would have a device tech RN review it and call her back either today or Monday. Pt verbalized understanding.

## 2016-10-23 ENCOUNTER — Telehealth: Payer: Self-pay | Admitting: Internal Medicine

## 2016-10-23 NOTE — Telephone Encounter (Signed)
Called patient to schedule awv. Lvm for patient to call office to schedule appt.  °

## 2016-11-04 ENCOUNTER — Ambulatory Visit (INDEPENDENT_AMBULATORY_CARE_PROVIDER_SITE_OTHER): Payer: Medicare Other | Admitting: *Deleted

## 2016-11-04 DIAGNOSIS — I495 Sick sinus syndrome: Secondary | ICD-10-CM

## 2016-11-04 NOTE — Progress Notes (Signed)
Remote pacemaker transmission.   

## 2016-11-05 ENCOUNTER — Encounter: Payer: Self-pay | Admitting: Cardiology

## 2016-11-05 NOTE — Progress Notes (Signed)
Letter  

## 2016-11-07 LAB — CUP PACEART REMOTE DEVICE CHECK
Implantable Lead Implant Date: 20171009
Implantable Lead Implant Date: 20171009
Implantable Lead Location: 753860
Implantable Lead Model: 5076
Implantable Lead Model: 7741
Implantable Lead Serial Number: 785430
Implantable Pulse Generator Implant Date: 20171009
Lead Channel Impedance Value: 558 Ohm
Lead Channel Impedance Value: 719 Ohm
Lead Channel Pacing Threshold Amplitude: 0.5 V
Lead Channel Pacing Threshold Pulse Width: 0.4 ms
Lead Channel Pacing Threshold Pulse Width: 1 ms
Lead Channel Sensing Intrinsic Amplitude: 2.5 mV
Lead Channel Sensing Intrinsic Amplitude: 6.8 mV
MDC IDC LEAD LOCATION: 753859
MDC IDC MSMT LEADCHNL RV PACING THRESHOLD AMPLITUDE: 0.8 V
MDC IDC SESS DTM: 20180413102049
MDC IDC STAT BRADY RA PERCENT PACED: 22 %
Pulse Gen Serial Number: 766467

## 2016-11-17 ENCOUNTER — Encounter: Payer: Self-pay | Admitting: Neurology

## 2016-11-17 ENCOUNTER — Ambulatory Visit (INDEPENDENT_AMBULATORY_CARE_PROVIDER_SITE_OTHER): Payer: Medicare Other | Admitting: Neurology

## 2016-11-17 VITALS — BP 110/70 | HR 67 | Ht 61.5 in | Wt 162.1 lb

## 2016-11-17 DIAGNOSIS — I739 Peripheral vascular disease, unspecified: Secondary | ICD-10-CM

## 2016-11-17 NOTE — Progress Notes (Signed)
Follow-up Visit   Date: 11/17/16    ABRIEL HATTERY MRN: [id number] DOB: 06-04-1935   Interim History: [name] is a 82 y.o. right-handed Caucasian female with anxiety, GERD, s/p C5-6 ACF, and osteoporesis returning to the clinic for follow-up of husband.  The patient was accompanied to the clinic by husband who also provides collateral information.    History of present illness: Initial visit 03/11/2016 for spells:  Since September 2016, she began having spells of wave of abnormal sensation in her head, feels dazed.  These spells are brief, lasting about 7-10 seconds and occurring sporadically up to several times per hour, but recently are occurring less frequently.  This is associated with bilateral leg tightness and weakness. Her husband says that she is unresponsive with open eyes and has a flat expression.  She denies loss of consciousness and is able to hear him.  They have noticed any triggers or patterns.  When she feels this coming on, she feels as if there is a knot in her throat, neck gets heavy, and legs get weak.  She usually tries to find somewhere to sit or hold onto something.  She does not have leg pain, back pain, or numbness/tingling.  She occasionally has a quiver over the legs.  She walks independently and has not had any falls.  In 2016, she was found to have acute T8 compression fracture and attributes it to doing core exercises at the [facility], because she has not sustained any falls.    She does not have leg pain or tightness independent of these spells.    She has a long history of neck pain radiating to her head, described as spasm and pulling sensation.  She has previously underwent C5-6 ACDF by Dr. Sherwood Gambler.  She was taking Cymbalta for anxiety and noticed that it also helped with her neck discomfort, but has discontinued this due to side effects.  She occasionally has numbness in her left hand.    UPDATE 07/16/2016:  At her last visit, EEG and holter  monitoring was ordered and her Holter showed long sinus pauses and sinus brady for which she underwent pacemaker implantation in October.   She no longer has spells of feeling dazed.  She returns for follow-up of bilateral leg hypersensitivity over the thighs and legs and gait unsteadiness.  Sometimes, she feels unsteady when she is trying to get up too quickly and her legs buckles.  She has not suffered any falls.  She walks independently, but often holds onto her husband for support.  She denies any numbness/tingling of the legs or feet.  She does not have radiating pain down her legs.  The hypersensitivity is usually in associated with low back and neck pain and worse with prolonged walking, standing, and sitting.  She often uses a cart at the grocery store and feels that she is able to walk longer without taking as many breaks.  At church, she is unable to walk far without having to take frequent rest breaks.  After she rests, she feels much better and is able to walk again for a bit.     UPDATE 11/17/2016:   She is here for follow-up appointment.  She was in [place], [place] and developed shortness of breath with exertion in addition to bilateral leg pain.  When she saw me back in December, CT lumbar spine showed mild bilateral foraminal stenosis at L5-S1, worse on the right, so she was referred to PT for possible neurogenic  claudication.  PT helped with back pain and her legs stopped buckling; however, in February, she again started having bilateral lower leg pain triggered by walking 10-20 minutes, described as achy and burning sensation.  Symptoms were alleviated with rest within a few minutes and she could resume walking for another 10-20 minutes.   She denies any associated back pain, numbness/tingling of the legs.  She is also concerned about her exertional shortness of breath and has an appointment this week with her PCP for this.   Medications:  Current Outpatient Prescriptions on File Prior to Visit    Medication Sig Dispense Refill  . AMBULATORY NON FORMULARY MEDICATION 1 Units by Other route daily. Rollator 1 Units 0  . aspirin 81 MG tablet Take 81 mg by mouth daily.    . Calcium Carbonate-Vit D-Min (CALTRATE 600+D PLUS MINERALS PO) Take 2 tablets by mouth 2 (two) times daily.     . cholecalciferol (VITAMIN D) 1000 UNITS tablet Take 2,000 Units by mouth daily.     . Cyanocobalamin (RA VITAMIN B-12 TR) 1000 MCG TBCR Take 1 tablet by mouth every other day.     . esomeprazole (NEXIUM) 40 MG capsule Take 1 capsule (40 mg total) by mouth 2 (two) times daily before a meal. 60 capsule 11  . fluticasone (FLONASE) 50 MCG/ACT nasal spray Place 1 spray into both nostrils daily as needed for allergies.     [name] loratadine (CLARITIN) 10 MG tablet Take 10 mg by mouth daily as needed for allergies.    . naproxen sodium (ALEVE) 220 MG tablet Take 220 mg by mouth 2 (two) times daily with a meal.     . polyethylene glycol (MIRALAX / GLYCOLAX) packet Take 17 g by mouth daily as needed (constipation).     . ranitidine (ZANTAC) 150 MG tablet Take 1 tablet (150 mg total) by mouth at bedtime as needed for heartburn. 90 tablet 3   No current facility-administered medications on file prior to visit.     Allergies:  Allergies  Allergen Reactions  . Lipitor [Atorvastatin]     Lipitor caused hospitalization - depletion of electrolytes, fever, nausea, loss of appetite, couldn't get out of bed  . Statins     Lipitor caused hospitalization - - depletion of electrolytes, fever, nausea, loss of appetite, couldn't get out of bed  . Cymbalta [Duloxetine Hcl]     Dulled her too much, difficulty urinating, change in vision  . Fosamax [Alendronate Sodium]     Caused chronic issues swallowing  . Gabapentin Other (See Comments)    Dizziness and sedation  . Lyrica [Pregabalin]     Unknown  . Tizanidine Other (See Comments)    Could not sleep    Review of Systems:  CONSTITUTIONAL: No fevers, chills, night sweats, or  weight loss.  EYES: No visual changes or eye pain ENT: No hearing changes.  No history of nose bleeds.   RESPIRATORY: No cough, wheezing and shortness of breath.   CARDIOVASCULAR: Negative for chest pain, and palpitations.   GI: Negative for abdominal discomfort, blood in stools or black stools.  No recent change in bowel habits.   GU:  No history of incontinence.   MUSCLOSKELETAL: No history of joint pain or swelling.  No myalgias.   SKIN: Negative for lesions, rash, and itching.   ENDOCRINE: Negative for cold or heat intolerance, polydipsia or goiter.   PSYCH:  No depression or anxiety symptoms.   NEURO: As Above.   Vital Signs:  BP  110/70   Pulse 67   Ht 5' 1.5" (1.562 m)   Wt 162 lb 1 oz (73.5 kg)   SpO2 98%   BMI 30.13 kg/m   Neurological Exam: MENTAL STATUS including orientation to time, place, person, recent and remote memory, attention span and concentration, language, and fund of knowledge is normal.  Speech is not dysarthric.  CRANIAL NERVES: No visual field defects. Pupils equal round and reactive to light.  Normal conjugate, extra-ocular eye movements in all directions of gaze.  No ptosis.  Face is symmetric. Palate elevates symmetrically.  Tongue is midline.  MOTOR:  Motor strength is 5/5 in all extremities.  No atrophy, fasciculations or abnormal movements.   Tone is normal.    MSRs: Reflexes are 2+/4 throughout, and 3+/4 bilateral patella.  SENSORY:  Intact to vibration throughout.    COORDINATION/GAIT:  Normal finger-to- nose-finger. She is able to stand from chair without using arms (improved).  Gait is slightly unsteady, unassisted.  Data: MRI brain wwo contrast 03/20/2016: normal Routine EEG 03/24/2016:  Normal  Holter 04/24/2016:   SR  20 to 95 bpm  Average HR 63 bpm   Rare PAC, PVC Longest pause 7.2 sec at 5:11 AM A few other long pauses, one over 5 seconds in late afternoon   Lab Results  Component Value Date   TSH 2.54 03/17/2016   Lab Results    Component Value Date   VITAMINB12 662 03/17/2016   CT lumbar spine wo contrast 07/17/2016:  1. Slightly progressive disc degeneration at L5-S1 with mild-to-moderate right and mild left neural foraminal stenosis. 2. Minimal lumbar spondylosis elsewhere without significant stenosis.  IMPRESSION/PLAN: 1.  Bilateral leg claudication manifesting with bilateral leg pain and burning.  Initially treated for possible neurogenic claudication with PT, but now denies back pain but continues to have bilateral leg burning with exertion and shortness of breath.  She specifically denies low back pain with walking which makes me think this is less likely stemming from her back. Further, her imaging findings of foraminal stenosis at L5-S1 is mild.  She is unable to get MRI due to PPM.  Vascular claudication is possible and recommend arterial studies of the legs. She will be seeing Dr. Quay Burow this week and would like to discuss this with her.  If her arterial studies are unrevealing, the next step will be NCS/EMG of the legs.  2.  Shortness of breath with exertion, follow-up with PCP  3.  Sinus node dysfunction s/p PPM, followed by cardiology.     The duration of this appointment visit was 25 minutes of face-to-face time with the patient.  Greater than 50% of this time was spent in counseling, explanation of diagnosis, planning of further management, and coordination of care.   Thank you for allowing me to participate in patient's care.  If I can answer any additional questions, I would be pleased to do so.    Sincerely,    Donika K. Posey Pronto, DO

## 2016-11-17 NOTE — Patient Instructions (Signed)
1.  Please follow-up with Dr. Quay Burow about shortness of breath.  I would recommend that you also discuss having arterial studies of the legs. 2.  If you would like to proceed with nerve testing, please call my office

## 2016-11-19 NOTE — Progress Notes (Signed)
Subjective:    Patient ID: [name], female    DOB: 1934-10-06, 82 y.o.   MRN: [id number]  HPI The patient is here for follow up.  Prediabetes:  She is compliant with a low sugar/carbohydrate diet.  She is not  exercising regularly.  GERD:  She is taking her medication daily as prescribed.  She denies any GERD symptoms and feels her GERD is well controlled.   Osteoporosis:  She has had two prolia injections.  She is taking calcium and vitamin d daily, but not as much.  She is not exercising.    SOB, Leg weakness; She has still had episodes even after the PPM was placed.  When she walks she will get SOB and can not talk and walk.  Her legs would get weak, burning sensation and would give out on her.  After her legs start with symtpoms she will feel SOB.  She sometimes gets cramping in her right lower leg.  Sometimes she gets weak or SOB without her leg symptoms, but she thinks the SOB typically starts after her legs feel weak.  She has seen neurology.  She has not had a vascular evaluation.       Medications and allergies reviewed with patient and updated if appropriate.  Patient Active Problem List   Diagnosis Date Noted  . Hyperreflexia of lower extremity 07/16/2016  . Bilateral leg paresthesia 07/16/2016  . Carotid arterial disease ([place]) 07/13/2016  . Undiagnosed cardiac murmurs 06/23/2016  . Poor balance 06/23/2016  . Lightheadedness 06/23/2016  . Weakness of both lower extremities 06/23/2016  . Sinus node dysfunction ([place]) 05/05/2016  . Right shoulder pain 03/14/2016  . Multinodular goiter (nontoxic) 02/21/2016  . Neck pain 02/15/2016  . Pain in limb 02/15/2016  . Neck nodule 02/15/2016  . Prediabetes 12/17/2015  . Dysphagia 12/17/2015  . Anxiety 11/13/2015  . T8 vertebral fracture ([place]) 04/09/2015  . Overweight 10/09/2011  . Allergic rhinitis, cause unspecified   . GERD (gastroesophageal reflux disease) 02/05/2011  . EPISTAXIS 07/23/2010  . FATIGUE  09/28/2009  . Vitamin B 12 deficiency 04/17/2009  . Hyperlipidemia 04/17/2009  . Osteoporosis 04/17/2009  . ADENOCARCINOMA, BREAST, HX OF 04/17/2009  . CONSTIPATION 03/08/2009  . COLONIC POLYPS, HYPERPLASTIC, HX OF 03/06/2009    Current Outpatient Prescriptions on File Prior to Visit  Medication Sig Dispense Refill  . AMBULATORY NON FORMULARY MEDICATION 1 Units by Other route daily. Rollator 1 Units 0  . aspirin 81 MG tablet Take 81 mg by mouth daily.    . Calcium Carbonate-Vit D-Min (CALTRATE 600+D PLUS MINERALS PO) Take 2 tablets by mouth 2 (two) times daily.     . cholecalciferol (VITAMIN D) 1000 UNITS tablet Take 2,000 Units by mouth daily.     . Cyanocobalamin (RA VITAMIN B-12 TR) 1000 MCG TBCR Take 1 tablet by mouth every other day.     . esomeprazole (NEXIUM) 40 MG capsule Take 1 capsule (40 mg total) by mouth 2 (two) times daily before a meal. 60 capsule 11  . fluticasone (FLONASE) 50 MCG/ACT nasal spray Place 1 spray into both nostrils daily as needed for allergies.     [name] loratadine (CLARITIN) 10 MG tablet Take 10 mg by mouth daily as needed for allergies.    . naproxen sodium (ALEVE) 220 MG tablet Take 220 mg by mouth 2 (two) times daily with a meal.     . polyethylene glycol (MIRALAX / GLYCOLAX) packet Take 17 g by mouth daily as needed (constipation).  No current facility-administered medications on file prior to visit.     Past Medical History:  Diagnosis Date  . Cancer of left breast ([place]) 1978  . Chronic thoracic back pain    "T8; fracture; 03/2015; no OR" (05/05/2016)  . GERD (gastroesophageal reflux disease)   . Heart murmur   . History of hiatal hernia   . Hyperlipidemia   . Hyperplastic colon polyp   . IBS (irritable bowel syndrome)   . Multiple thyroid nodules   . Osteoporosis    T8 compression fx 03/2015   . Presence of permanent cardiac pacemaker   . Vitamin B12 deficiency     Past Surgical History:  Procedure Laterality Date  . ANTERIOR CERVICAL  DECOMP/DISCECTOMY FUSION  2008   C5-6  . BACK SURGERY    . BREAST SURGERY    . CARDIAC CATHETERIZATION  2001  . EP IMPLANTABLE DEVICE N/A 05/05/2016   Procedure: Pacemaker Implant;  Surgeon: Evans Lance, MD;  Location: [facility];  Service: Cardiovascular;  Laterality: N/A;  . EXCISIONAL HEMORRHOIDECTOMY  1984   With subsequent correction of surgery  . INSERT / REPLACE / REMOVE PACEMAKER    . KNEE ARTHROSCOPY Left    "meniscus tear"  . MASTECTOMY Left 1978  . PLACEMENT OF BREAST IMPLANTS Left 1981  . SHOULDER ARTHROSCOPY W/ ROTATOR CUFF REPAIR Left 02/2006   "tear"  . TUBAL LIGATION      Social History   Social History  . Marital status: Married    Spouse name: N/A  . Number of children: N/A  . Years of education: N/A   Social History Main Topics  . Smoking status: Never Smoker  . Smokeless tobacco: Never Used  . Alcohol use No  . Drug use: No  . Sexual activity: Not on file   Other Topics Concern  . Not on file   Social History Narrative   Housewife, Lives with spouse, 2 children    Family History  Problem Relation Age of Onset  . Diabetes Sister   . Diabetes Brother   . Breast cancer Maternal Aunt   . Colon cancer Neg Hx     Review of Systems  Constitutional: Negative for chills and fever.  Respiratory: Positive for shortness of breath. Negative for cough and wheezing.   Cardiovascular: Negative for chest pain, palpitations and leg swelling.  Neurological: Negative for light-headedness and headaches.       Objective:   Vitals:   11/20/16 1055  BP: 140/70  Pulse: 73  Temp: 97.7 F (36.5 C)   Wt Readings from Last 3 Encounters:  11/20/16 162 lb (73.5 kg)  11/17/16 162 lb 1 oz (73.5 kg)  08/21/16 168 lb 6 oz (76.4 kg)   Body mass index is 30.11 kg/m.   Physical Exam    Constitutional: Appears well-developed and well-nourished. No distress.  HENT:  Head: Normocephalic and atraumatic.  Neck: Neck supple. No tracheal deviation  present. No thyromegaly present.  No cervical lymphadenopathy Cardiovascular: Normal rate, regular rhythm and normal heart sounds.   No murmur heard. No carotid bruit .  No edema. + 2 pedal pulse of left, palpable pulse right foot Pulmonary/Chest: Effort normal and breath sounds normal. No respiratory distress. No has no wheezes. No rales.  Skin: Skin is warm and dry. Not diaphoretic.  Psychiatric: Normal mood and affect. Behavior is normal.      Assessment & Plan:    See Problem List for Assessment and Plan of chronic medical problems.

## 2016-11-20 ENCOUNTER — Other Ambulatory Visit (INDEPENDENT_AMBULATORY_CARE_PROVIDER_SITE_OTHER): Payer: Medicare Other

## 2016-11-20 ENCOUNTER — Ambulatory Visit (INDEPENDENT_AMBULATORY_CARE_PROVIDER_SITE_OTHER): Payer: Medicare Other | Admitting: Internal Medicine

## 2016-11-20 VITALS — BP 140/70 | HR 73 | Temp 97.7°F | Ht 61.5 in | Wt 162.0 lb

## 2016-11-20 DIAGNOSIS — R0600 Dyspnea, unspecified: Secondary | ICD-10-CM | POA: Diagnosis not present

## 2016-11-20 DIAGNOSIS — I739 Peripheral vascular disease, unspecified: Secondary | ICD-10-CM | POA: Diagnosis not present

## 2016-11-20 DIAGNOSIS — K219 Gastro-esophageal reflux disease without esophagitis: Secondary | ICD-10-CM | POA: Diagnosis not present

## 2016-11-20 DIAGNOSIS — H01005 Unspecified blepharitis left lower eyelid: Secondary | ICD-10-CM | POA: Diagnosis not present

## 2016-11-20 DIAGNOSIS — M810 Age-related osteoporosis without current pathological fracture: Secondary | ICD-10-CM | POA: Diagnosis not present

## 2016-11-20 DIAGNOSIS — H524 Presbyopia: Secondary | ICD-10-CM | POA: Diagnosis not present

## 2016-11-20 DIAGNOSIS — H01001 Unspecified blepharitis right upper eyelid: Secondary | ICD-10-CM | POA: Diagnosis not present

## 2016-11-20 DIAGNOSIS — R7303 Prediabetes: Secondary | ICD-10-CM

## 2016-11-20 DIAGNOSIS — H01002 Unspecified blepharitis right lower eyelid: Secondary | ICD-10-CM | POA: Diagnosis not present

## 2016-11-20 DIAGNOSIS — H01004 Unspecified blepharitis left upper eyelid: Secondary | ICD-10-CM | POA: Diagnosis not present

## 2016-11-20 LAB — HEMOGLOBIN A1C: Hgb A1c MFr Bld: 6.2 % (ref 4.6–6.5)

## 2016-11-20 LAB — COMPREHENSIVE METABOLIC PANEL
ALBUMIN: 4.3 g/dL (ref 3.5–5.2)
ALT: 8 U/L (ref 0–35)
AST: 11 U/L (ref 0–37)
Alkaline Phosphatase: 34 U/L — ABNORMAL LOW (ref 39–117)
BILIRUBIN TOTAL: 0.3 mg/dL (ref 0.2–1.2)
BUN: 18 mg/dL (ref 6–23)
CHLORIDE: 106 meq/L (ref 96–112)
CO2: 26 meq/L (ref 19–32)
CREATININE: 1.03 mg/dL (ref 0.40–1.20)
Calcium: 9.2 mg/dL (ref 8.4–10.5)
GFR: 54.86 mL/min — ABNORMAL LOW (ref >60.00)
Glucose, Bld: 105 mg/dL — ABNORMAL HIGH (ref 70–99)
Potassium: 4.5 mEq/L (ref 3.5–5.1)
SODIUM: 138 meq/L (ref 135–145)
Total Protein: 7.2 g/dL (ref 6.0–8.3)

## 2016-11-20 LAB — VITAMIN D 25 HYDROXY (VIT D DEFICIENCY, FRACTURES): VITD: 64.65 ng/mL (ref 30.00–100.00)

## 2016-11-20 MED ORDER — RANITIDINE HCL 150 MG PO TABS: 150.0000 mg | ORAL_TABLET | Freq: Every evening | ORAL | 3 refills | Status: DC | PRN

## 2016-11-20 NOTE — Progress Notes (Signed)
Pre visit review using our clinic review tool, if applicable. No additional management support is needed unless otherwise documented below in the visit note. 

## 2016-11-20 NOTE — Assessment & Plan Note (Signed)
GERD controlled Continue daily medication  

## 2016-11-20 NOTE — Assessment & Plan Note (Signed)
Check a1c Low sugar / carb diet Stressed regular exercise, weight loss  

## 2016-11-20 NOTE — Patient Instructions (Addendum)
  Test(s) ordered today. Your results will be released to [place] (or called to you) after review, usually within 72hours after test completion. If any changes need to be made, you will be notified at that same time.   Medications reviewed and updated.   No changes recommended at this time.    A referral was ordered for vascular surgery to evaluate your leg pain.   Please followup in 6 months

## 2016-11-21 ENCOUNTER — Other Ambulatory Visit: Payer: Self-pay

## 2016-11-21 ENCOUNTER — Ambulatory Visit (HOSPITAL_COMMUNITY): Payer: Medicare Other | Attending: Cardiology

## 2016-11-21 ENCOUNTER — Ambulatory Visit: Payer: Medicare Other | Admitting: Internal Medicine

## 2016-11-21 DIAGNOSIS — R42 Dizziness and giddiness: Secondary | ICD-10-CM

## 2016-11-21 DIAGNOSIS — I340 Nonrheumatic mitral (valve) insufficiency: Secondary | ICD-10-CM | POA: Insufficient documentation

## 2016-11-21 DIAGNOSIS — R011 Cardiac murmur, unspecified: Secondary | ICD-10-CM

## 2016-11-21 DIAGNOSIS — I313 Pericardial effusion (noninflammatory): Secondary | ICD-10-CM | POA: Insufficient documentation

## 2016-11-21 DIAGNOSIS — I5030 Unspecified diastolic (congestive) heart failure: Secondary | ICD-10-CM | POA: Diagnosis not present

## 2016-11-21 DIAGNOSIS — I352 Nonrheumatic aortic (valve) stenosis with insufficiency: Secondary | ICD-10-CM | POA: Diagnosis not present

## 2016-11-21 DIAGNOSIS — I517 Cardiomegaly: Secondary | ICD-10-CM | POA: Insufficient documentation

## 2016-11-21 DIAGNOSIS — I420 Dilated cardiomyopathy: Secondary | ICD-10-CM | POA: Diagnosis not present

## 2016-11-22 ENCOUNTER — Encounter: Payer: Self-pay | Admitting: Internal Medicine

## 2016-11-22 DIAGNOSIS — I739 Peripheral vascular disease, unspecified: Secondary | ICD-10-CM | POA: Insufficient documentation

## 2016-11-22 DIAGNOSIS — R0600 Dyspnea, unspecified: Secondary | ICD-10-CM | POA: Insufficient documentation

## 2016-11-22 DIAGNOSIS — I5189 Other ill-defined heart diseases: Secondary | ICD-10-CM | POA: Insufficient documentation

## 2016-11-22 DIAGNOSIS — R0609 Other forms of dyspnea: Secondary | ICD-10-CM | POA: Insufficient documentation

## 2016-11-22 NOTE — Assessment & Plan Note (Signed)
Symptoms consistent with claudication - will refer to vascular for evaluation

## 2016-11-22 NOTE — Assessment & Plan Note (Signed)
Has had prolia x 2 continue cal/vita d Increase exercise continue with prolia

## 2016-11-22 NOTE — Assessment & Plan Note (Signed)
?   SOB due to deconditioning, leg weakness and obesity She had a normal CXR in October 2017, she is a never smoker - less likely pulmonary in origin Following with cardiology Will evaluation leg claudication Advised increasing activity - SOB may improve with increased activity

## 2016-11-26 ENCOUNTER — Telehealth: Payer: Self-pay | Admitting: Internal Medicine

## 2016-11-26 ENCOUNTER — Other Ambulatory Visit: Payer: Self-pay | Admitting: Internal Medicine

## 2016-11-26 DIAGNOSIS — Z1231 Encounter for screening mammogram for malignant neoplasm of breast: Secondary | ICD-10-CM

## 2016-11-26 NOTE — Telephone Encounter (Signed)
Tried contacting pt back, unable to LVM

## 2016-11-26 NOTE — Telephone Encounter (Signed)
Pt called checking on the results from her echocardiogram that was done on 10/27/2016. Please advise.

## 2016-11-27 NOTE — Telephone Encounter (Signed)
Spoke with pt. See lab notes.

## 2016-11-27 NOTE — Telephone Encounter (Signed)
Pt called back and is having trouble with cell phone please call cell [phone]

## 2016-12-09 NOTE — Progress Notes (Signed)
Pre visit review using our clinic review tool, if applicable. No additional management support is needed unless otherwise documented below in the visit note. 

## 2016-12-09 NOTE — Progress Notes (Addendum)
Subjective:   [name] is a 82 y.o. female who presents for Medicare Annual (Subsequent) preventive examination.  Review of Systems:  No ROS.  Medicare Wellness Visit.  Cardiac Risk Factors include: advanced age (>6men, >73 women);dyslipidemia;obesity (BMI >30kg/m2) Sleep patterns: no sleep issues, feels rested on waking, gets up 1-2 times nightly to void and sleeps 7-8 hours nightly.   Home Safety/Smoke Alarms: Feels safe in home. Smoke alarms in place.    Living environment; residence and Firearm Safety: 2-story house, no firearms.lives with husband, no needs for DME Seat Belt Safety/Bike Helmet: Wears seat belt.   Counseling:   Eye Exam- appointment yearly Dental- appointment every 6 months   Female:   Pap- N/A       Mammo- Last 12/13/15,BI-RADS CATEGORY  1: Negative/  12/19/16 has mammo scheduled, history breast cancer left      Dexa scan- Last 06/23/16        CCS- Last 08/18/06, hemorrhoids, hyperplastic polyp, recall 10 years    Objective:     Vitals: BP (!) 126/58   Pulse 60   Resp 20   Ht 5\' 2"  (1.575 m)   Wt 161 lb (73 kg)   SpO2 99%   BMI 29.45 kg/m   Body mass index is 29.45 kg/m.   Tobacco History  Smoking Status  . Never Smoker  Smokeless Tobacco  . Never Used     Counseling given: Not Answered   Past Medical History:  Diagnosis Date  . Cancer of left breast ([place]) 1978  . Chronic thoracic back pain    "T8; fracture; 03/2015; no OR" (05/05/2016)  . GERD (gastroesophageal reflux disease)   . Heart murmur   . History of hiatal hernia   . Hyperlipidemia   . Hyperplastic colon polyp   . IBS (irritable bowel syndrome)   . Multiple thyroid nodules   . Osteoporosis    T8 compression fx 03/2015   . Presence of permanent cardiac pacemaker   . Vitamin B12 deficiency    Past Surgical History:  Procedure Laterality Date  . ANTERIOR CERVICAL DECOMP/DISCECTOMY FUSION  2008   C5-6  . BACK SURGERY    . BREAST SURGERY    . CARDIAC  CATHETERIZATION  2001  . EP IMPLANTABLE DEVICE N/A 05/05/2016   Procedure: Pacemaker Implant;  Surgeon: Evans Lance, MD;  Location: [facility];  Service: Cardiovascular;  Laterality: N/A;  . EXCISIONAL HEMORRHOIDECTOMY  1984   With subsequent correction of surgery  . INSERT / REPLACE / REMOVE PACEMAKER    . KNEE ARTHROSCOPY Left    "meniscus tear"  . MASTECTOMY Left 1978  . PLACEMENT OF BREAST IMPLANTS Left 1981  . SHOULDER ARTHROSCOPY W/ ROTATOR CUFF REPAIR Left 02/2006   "tear"  . TUBAL LIGATION     Family History  Problem Relation Age of Onset  . Diabetes Sister   . Diabetes Brother   . Breast cancer Maternal Aunt   . Colon cancer Neg Hx    History  Sexual Activity  . Sexual activity: Not Currently    Outpatient Encounter Prescriptions as of 12/10/2016  Medication Sig  . AMBULATORY NON FORMULARY MEDICATION 1 Units by Other route daily. Rollator  . aspirin 81 MG tablet Take 81 mg by mouth daily.  . Calcium Carbonate-Vit D-Min (CALTRATE 600+D PLUS MINERALS PO) Take 2 tablets by mouth 2 (two) times daily.   . cholecalciferol (VITAMIN D) 1000 UNITS tablet Take 2,000 Units by mouth daily.   [name]  Cyanocobalamin (RA VITAMIN B-12 TR) 1000 MCG TBCR Take 1 tablet by mouth every other day.   . esomeprazole (NEXIUM) 40 MG capsule Take 1 capsule (40 mg total) by mouth 2 (two) times daily before a meal.  . fluticasone (FLONASE) 50 MCG/ACT nasal spray Place 1 spray into both nostrils daily as needed for allergies.   [name] loratadine (CLARITIN) 10 MG tablet Take 10 mg by mouth daily as needed for allergies.  . naproxen sodium (ALEVE) 220 MG tablet Take 220 mg by mouth 2 (two) times daily with a meal.   . polyethylene glycol (MIRALAX / GLYCOLAX) packet Take 17 g by mouth daily as needed (constipation).   . ranitidine (ZANTAC) 150 MG tablet Take 1 tablet (150 mg total) by mouth at bedtime as needed for heartburn.   No facility-administered encounter medications on file as of 12/10/2016.      Activities of Daily Living In your present state of health, do you have any difficulty performing the following activities: 12/10/2016 05/05/2016  Hearing? N -  Vision? N -  Difficulty concentrating or making decisions? N -  Walking or climbing stairs? N -  Dressing or bathing? N -  Doing errands, shopping? N Y  Conservation officer, nature and eating ? N -  Using the Toilet? N -  In the past six months, have you accidently leaked urine? N -  Do you have problems with loss of bowel control? N -  Managing your Medications? N -  Managing your Finances? N -  Housekeeping or managing your Housekeeping? N -  Some recent data might be hidden    Patient Care Team: [name], MD as PCP - General (Internal Medicine) Ladene Artist, MD (Gastroenterology) Servando Salina, MD (Obstetrics and Gynecology) Hazle Coca, MD (Inactive) as Consulting Physician (Neurosurgery) Melida Quitter, MD as Consulting Physician (Otolaryngology) Allyn Kenner, MD (Dermatology) Calvert Cantor, MD (Ophthalmology) Berle Mull, MD (Sports Medicine)    Assessment:    Physical assessment deferred to PCP.  Exercise Activities and Dietary recommendations Current Exercise Habits: Structured exercise class, Type of exercise: Other - see comments;walking;strength training/weights (stationary bike), Time (Minutes): 30, Frequency (Times/Week): 2, Weekly Exercise (Minutes/Week): 60, Intensity: Mild, Exercise limited by: cardiac condition(s)  Discussed increasing the duration of exercise to build up endurance and stamina.   Diet (meal preparation, eat out, water intake, caffeinated beverages, dairy products, fruits and vegetables): in general, a "healthy" diet  , well balanced, low fat/ cholesterol, low salt, eats a variety of fruits and vegetables daily, limits salt, fat/cholesterol, sugar, caffeine, drinks 6-8 glasses of water daily.  Reviewed portion control, heart healthy diet, and reading labels.  Diet education  provided to patient via handout.  Goals    . lose weight          Increase the amount of exercise weekly, continue to eat healthy.      Fall Risk Fall Risk  12/10/2016 11/17/2016 07/16/2016 03/27/2016 03/14/2016  Falls in the past year? No No No No No  Number falls in past yr: - - - - -  Injury with Fall? - - - - -  Risk for fall due to : - - - - -  Follow up - - - - -   Depression Screen PHQ 2/9 Scores 12/10/2016 03/14/2016 12/20/2014 12/16/2013  PHQ - 2 Score 0 0 0 1     Cognitive Function       Ad8 score reviewed for issues:  Issues making decisions: no  Less interest in  hobbies / activities: no  Repeats questions, stories (family complaining): no  Trouble using ordinary gadgets (microwave, computer, phone): no  Forgets the month or year: no  Mismanaging finances: no  Remembering appts: no  Daily problems with thinking and/or memory: no Ad8 score is= 0     Immunization History  Administered Date(s) Administered  . Influenza Split 06/27/2011, 06/09/2012  . Influenza Whole 07/09/2009, 07/02/2010  . Influenza, High Dose Seasonal PF 05/02/2013, 05/31/2014, 06/26/2015, 06/02/2016  . Pneumococcal Conjugate-13 06/19/2014  . Pneumococcal Polysaccharide-23 07/28/2000, 10/09/2011  . Td 07/28/2000  . Tdap 10/15/2010  . Zoster 03/12/2011   Screening Tests Health Maintenance  Topic Date Due  . INFLUENZA VACCINE  02/25/2017  . DEXA SCAN  06/23/2018  . TETANUS/TDAP  10/14/2020  . PNA vac Low Risk Adult  Completed      Plan:    Continue to eat heart healthy diet (full of fruits, vegetables, whole grains, lean protein, water--limit salt, fat, and sugar intake) and increase physical activity as tolerated.  Continue doing brain stimulating activities (puzzles, reading, adult coloring books, staying active) to keep memory sharp.    I have personally reviewed and noted the following in the patient's chart:   . Medical and social history . Use of alcohol, tobacco or  illicit drugs  . Current medications and supplements . Functional ability and status . Nutritional status . Physical activity . Advanced directives . List of other physicians . Vitals . Screenings to include cognitive, depression, and falls . Referrals and appointments  In addition, I have reviewed and discussed with patient certain preventive protocols, quality metrics, and best practice recommendations. A written personalized care plan for preventive services as well as general preventive health recommendations were provided to patient.     Michiel Cowboy, RN  12/10/2016   Medical screening examination/treatment/procedure(s) were performed by non-physician practitioner and as supervising physician I was immediately available for consultation/collaboration. I agree with above. [name], MD

## 2016-12-10 ENCOUNTER — Ambulatory Visit (INDEPENDENT_AMBULATORY_CARE_PROVIDER_SITE_OTHER): Payer: Medicare Other | Admitting: *Deleted

## 2016-12-10 VITALS — BP 126/58 | HR 60 | Resp 20 | Ht 62.0 in | Wt 161.0 lb

## 2016-12-10 DIAGNOSIS — Z0000 Encounter for general adult medical examination without abnormal findings: Secondary | ICD-10-CM

## 2016-12-10 NOTE — Patient Instructions (Signed)
Continue to eat heart healthy diet (full of fruits, vegetables, whole grains, lean protein, water--limit salt, fat, and sugar intake) and increase physical activity as tolerated.  Continue doing brain stimulating activities (puzzles, reading, adult coloring books, staying active) to keep memory sharp.    [name] , Thank you for taking time to come for your Medicare Wellness Visit. I appreciate your ongoing commitment to your health goals. Please review the following plan we discussed and let me know if I can assist you in the future.   These are the goals we discussed: Goals    . lose weight          Increase the amount of exercise weekly, continue to eat healthy.       This is a list of the screening recommended for you and due dates:  Health Maintenance  Topic Date Due  . Flu Shot  02/25/2017  . DEXA scan (bone density measurement)  06/23/2018  . Tetanus Vaccine  10/14/2020  . Pneumonia vaccines  Completed

## 2016-12-19 ENCOUNTER — Ambulatory Visit
Admission: RE | Admit: 2016-12-19 | Discharge: 2016-12-19 | Disposition: A | Discharge status: Home / Self Care | Payer: Medicare Other | Source: Ambulatory Visit | Attending: Internal Medicine | Admitting: Internal Medicine

## 2016-12-19 DIAGNOSIS — Z1231 Encounter for screening mammogram for malignant neoplasm of breast: Secondary | ICD-10-CM

## 2016-12-19 HISTORY — DX: Personal history of irradiation: Z92.3

## 2016-12-23 DIAGNOSIS — X32XXXD Exposure to sunlight, subsequent encounter: Secondary | ICD-10-CM | POA: Diagnosis not present

## 2016-12-23 DIAGNOSIS — L820 Inflamed seborrheic keratosis: Secondary | ICD-10-CM | POA: Diagnosis not present

## 2016-12-23 DIAGNOSIS — C44629 Squamous cell carcinoma of skin of left upper limb, including shoulder: Secondary | ICD-10-CM | POA: Diagnosis not present

## 2016-12-23 DIAGNOSIS — L821 Other seborrheic keratosis: Secondary | ICD-10-CM | POA: Diagnosis not present

## 2016-12-23 DIAGNOSIS — L570 Actinic keratosis: Secondary | ICD-10-CM | POA: Diagnosis not present

## 2017-01-15 ENCOUNTER — Encounter: Payer: Self-pay | Admitting: Surgery

## 2017-01-22 ENCOUNTER — Telehealth: Payer: Self-pay | Admitting: General Practice

## 2017-01-22 NOTE — Telephone Encounter (Signed)
Prolia has been approved with 0 co-pay per Su Hilt, RN.  Patient scheduled for injection on Tuesday, 7/3.

## 2017-01-23 DIAGNOSIS — L258 Unspecified contact dermatitis due to other agents: Secondary | ICD-10-CM | POA: Diagnosis not present

## 2017-01-23 DIAGNOSIS — Z08 Encounter for follow-up examination after completed treatment for malignant neoplasm: Secondary | ICD-10-CM | POA: Diagnosis not present

## 2017-01-23 DIAGNOSIS — Z85828 Personal history of other malignant neoplasm of skin: Secondary | ICD-10-CM | POA: Diagnosis not present

## 2017-01-26 ENCOUNTER — Encounter: Payer: Self-pay | Admitting: Surgery

## 2017-01-26 ENCOUNTER — Other Ambulatory Visit: Payer: Self-pay | Admitting: *Deleted

## 2017-01-26 ENCOUNTER — Ambulatory Visit (INDEPENDENT_AMBULATORY_CARE_PROVIDER_SITE_OTHER): Payer: Medicare Other | Admitting: Surgery

## 2017-01-26 ENCOUNTER — Ambulatory Visit (HOSPITAL_COMMUNITY)
Admission: RE | Admit: 2017-01-26 | Discharge: 2017-01-26 | Disposition: A | Discharge status: Home / Self Care | Payer: Medicare Other | Source: Ambulatory Visit | Attending: Surgery | Admitting: Surgery

## 2017-01-26 VITALS — BP 123/72 | HR 85 | Temp 97.9°F | Resp 18 | Wt 164.0 lb

## 2017-01-26 DIAGNOSIS — I739 Peripheral vascular disease, unspecified: Secondary | ICD-10-CM

## 2017-01-26 DIAGNOSIS — R0989 Other specified symptoms and signs involving the circulatory and respiratory systems: Secondary | ICD-10-CM | POA: Insufficient documentation

## 2017-01-26 DIAGNOSIS — I70213 Atherosclerosis of native arteries of extremities with intermittent claudication, bilateral legs: Secondary | ICD-10-CM | POA: Diagnosis not present

## 2017-01-26 DIAGNOSIS — E785 Hyperlipidemia, unspecified: Secondary | ICD-10-CM | POA: Insufficient documentation

## 2017-01-26 NOTE — Progress Notes (Signed)
[facility]  Patient name: [name] MRN: [id number] DOB: 08/28/1934 Sex: female   REQUESTING PROVIDER:    Dr. Quay Burow   REASON FOR CONSULT:    claudication  HISTORY OF PRESENT ILLNESS:   [name] is a 82 y.o. female, who is referred for evaluation of claudication.  The patient states that she has noticed a decline in her health over the course of the past 2 years.  She states that she gets a tightness behind her legs and her legs get tired with activity.  The distance or activity that she can perform [place] from a day-to-day basis.  She has undergone pacemaker insertion, which she feels has made an improvement.  She does not have rest pain or nonhealing wounds.  She is seeing neurology.  She has a long history of neck pain.  She is status post C5-6 ACDF.  She had a CT scan in April 2018 which showed bilateral foraminal stenosis at L5-S1.  She underwent physical therapy.  PAST MEDICAL HISTORY    Past Medical History:  Diagnosis Date  . Cancer of left breast ([place]) 1978  . Chronic thoracic back pain    "T8; fracture; 03/2015; no OR" (05/05/2016)  . GERD (gastroesophageal reflux disease)   . Heart murmur   . History of hiatal hernia   . Hyperlipidemia   . Hyperplastic colon polyp   . IBS (irritable bowel syndrome)   . Multiple thyroid nodules   . Osteoporosis    T8 compression fx 03/2015   . Personal history of radiation therapy   . Presence of permanent cardiac pacemaker   . Vitamin B12 deficiency      FAMILY HISTORY   Family History  Problem Relation Age of Onset  . Diabetes Sister   . Diabetes Brother   . Breast cancer Maternal Aunt   . Colon cancer Neg Hx     SOCIAL HISTORY:   Social History   Social History  . Marital status: Married    Spouse name: N/A  . Number of children: N/A  . Years of education: N/A   Occupational History  . Not on file.   Social History Main Topics   . Smoking status: Never Smoker  . Smokeless tobacco: Never Used  . Alcohol use No  . Drug use: No  . Sexual activity: Not Currently   Other Topics Concern  . Not on file   Social History Narrative   Housewife, Lives with spouse, 2 children    ALLERGIES:    Allergies  Allergen Reactions  . Lipitor [Atorvastatin]     Lipitor caused hospitalization - depletion of electrolytes, fever, nausea, loss of appetite, couldn't get out of bed  . Statins     Lipitor caused hospitalization - - depletion of electrolytes, fever, nausea, loss of appetite, couldn't get out of bed  . Cymbalta [Duloxetine Hcl]     Dulled her too much, difficulty urinating, change in vision  . Fosamax [Alendronate Sodium]     Caused chronic issues swallowing  . Gabapentin Other (See Comments)    Dizziness and sedation  . Lyrica [Pregabalin]     Unknown  . Tizanidine Other (See Comments)    Could not sleep    CURRENT MEDICATIONS:    Current Outpatient Prescriptions  Medication Sig Dispense Refill  . AMBULATORY NON FORMULARY MEDICATION 1 Units by Other route daily. Rollator 1 Units 0  . aspirin 81 MG tablet Take 81 mg by mouth  daily.    . Calcium Carbonate-Vit D-Min (CALTRATE 600+D PLUS MINERALS PO) Take 2 tablets by mouth 2 (two) times daily.     . cholecalciferol (VITAMIN D) 1000 UNITS tablet Take 2,000 Units by mouth daily.     . Cyanocobalamin (RA VITAMIN B-12 TR) 1000 MCG TBCR Take 1 tablet by mouth every other day.     . esomeprazole (NEXIUM) 40 MG capsule Take 1 capsule (40 mg total) by mouth 2 (two) times daily before a meal. 60 capsule 11  . fluticasone (FLONASE) 50 MCG/ACT nasal spray Place 1 spray into both nostrils daily as needed for allergies.     [name] loratadine (CLARITIN) 10 MG tablet Take 10 mg by mouth daily as needed for allergies.    . naproxen sodium (ALEVE) 220 MG tablet Take 220 mg by mouth 2 (two) times daily with a meal.     . polyethylene glycol (MIRALAX / GLYCOLAX) packet Take 17 g  by mouth daily as needed (constipation).     . ranitidine (ZANTAC) 150 MG tablet Take 1 tablet (150 mg total) by mouth at bedtime as needed for heartburn. 90 tablet 3   No current facility-administered medications for this visit.     REVIEW OF SYSTEMS:   [X]  denotes positive finding, [ ]  denotes negative finding Cardiac  Comments:  Chest pain or chest pressure:    Shortness of breath upon exertion: x   Short of breath when lying flat:    Irregular heart rhythm:        Vascular    Pain in calf, thigh, or hip brought on by ambulation:    Pain in feet at night that wakes you up from your sleep:     Blood clot in your veins:    Leg swelling:         Pulmonary    Oxygen at home:    Productive cough:     Wheezing:         Neurologic    Sudden weakness in arms or legs:     Sudden numbness in arms or legs:     Sudden onset of difficulty speaking or slurred speech:    Temporary loss of vision in one eye:     Problems with dizziness:         Gastrointestinal    Blood in stool:      Vomited blood:         Genitourinary    Burning when urinating:     Blood in urine:        Psychiatric    Major depression:         Hematologic    Bleeding problems:    Problems with blood clotting too easily:        Skin    Rashes or ulcers:        Constitutional    Fever or chills:     PHYSICAL EXAM:   There were no vitals filed for this visit.  GENERAL: The patient is a well-nourished female, in no acute distress. The vital signs are documented above. CARDIAC: There is a regular rate and rhythm.  VASCULAR: Palpable dorsalis pedis pulse bilaterally PULMONARY: Nonlabored respirations MUSCULOSKELETAL: There are no major deformities or cyanosis. NEUROLOGIC: No focal weakness or paresthesias are detected. SKIN: There are no ulcers or rashes noted. PSYCHIATRIC: The patient has a normal affect.  STUDIES:   I have reviewed her ABI's R=1.35, triphaisc L=1.25, triphasic  Toe  pressures are 162 on the  right and 167 on the left  ASSESSMENT and PLAN   Bilateral leg pain: The patient has palpable pedal pulses, and a normal vascular lab exam.  I do not think that her symptoms of leg pain are related to arterial insufficiency.  I discussed this with the patient and her husband.  I think she should focus and on a another potential etiology for her leg pain.  All other questions were answered today.  They were relieved to find out that she does not have significant vascular disease in her legs.   Annamarie Major, MD [facility] [phone] Pager [phone]

## 2017-01-27 ENCOUNTER — Ambulatory Visit (INDEPENDENT_AMBULATORY_CARE_PROVIDER_SITE_OTHER): Payer: Medicare Other

## 2017-01-27 DIAGNOSIS — M810 Age-related osteoporosis without current pathological fracture: Secondary | ICD-10-CM | POA: Diagnosis not present

## 2017-01-27 MED ORDER — DENOSUMAB 60 MG/ML ~~LOC~~ SOLN
60.0000 mg | Freq: Once | SUBCUTANEOUS | Status: AC
  Administered 2017-01-27: 60 mg via SUBCUTANEOUS

## 2017-01-27 NOTE — Progress Notes (Signed)
prolia Injection given.   [name] Fjeld [name], MD  

## 2017-02-03 ENCOUNTER — Ambulatory Visit (INDEPENDENT_AMBULATORY_CARE_PROVIDER_SITE_OTHER): Payer: Medicare Other | Admitting: *Deleted

## 2017-02-03 DIAGNOSIS — I495 Sick sinus syndrome: Secondary | ICD-10-CM

## 2017-02-03 NOTE — Progress Notes (Signed)
Remote pacemaker transmission.   

## 2017-02-04 ENCOUNTER — Telehealth: Payer: Self-pay

## 2017-02-04 NOTE — Telephone Encounter (Signed)
Patient came in today, she received prolia injection on 7/3 and left band aid on at injection site for the next 2 days, when she took band aid off, she has an area size of quarter slightly red, a little yellowish color with 3 distinctive marks, somewhat like a bruise or possibly a sensitivity reaction to bandaid kind of look---I have sent picture of the area to [facility] and also have advised dr burns

## 2017-02-04 NOTE — Telephone Encounter (Signed)
Per dr burns, she would like patient to watch this area for the next several days, she doesn't think there's anything too concerning---it could possibly be some type of localized sensitivity reaction---patient has been advised and also advised to call us back if this area changes in any way or if she has pain/itching develop

## 2017-02-10 ENCOUNTER — Encounter: Payer: Self-pay | Admitting: Cardiology

## 2017-02-16 LAB — CUP PACEART REMOTE DEVICE CHECK
Date Time Interrogation Session: 20180723153623
Implantable Lead Location: 753860
Implantable Pulse Generator Implant Date: 20171009
Lead Channel Impedance Value: 559 Ohm
Lead Channel Impedance Value: 700 Ohm
Lead Channel Sensing Intrinsic Amplitude: 8.4 mV
MDC IDC LEAD IMPLANT DT: 20171009
MDC IDC LEAD IMPLANT DT: 20171009
MDC IDC LEAD LOCATION: 753859
MDC IDC LEAD SERIAL: 785430
MDC IDC MSMT LEADCHNL RA SENSING INTR AMPL: 2.5 mV
MDC IDC MSMT LEADCHNL RV PACING THRESHOLD AMPLITUDE: 0.8 V
MDC IDC MSMT LEADCHNL RV PACING THRESHOLD PULSEWIDTH: 0.4 ms
MDC IDC STAT BRADY RA PERCENT PACED: 22 %
MDC IDC STAT BRADY RV PERCENT PACED: 0 %
Pulse Gen Serial Number: 766467

## 2017-03-20 ENCOUNTER — Other Ambulatory Visit: Payer: Self-pay | Admitting: Internal Medicine

## 2017-03-20 DIAGNOSIS — R0602 Shortness of breath: Secondary | ICD-10-CM

## 2017-04-01 NOTE — Progress Notes (Signed)
Pt arrived to office today thinking she had appt or to request appt.  Per Pt she had a recent ECHO and was distraught over the results.  Pt states she has had SOB, swelling in lower extremities.  Pt states she was also told her heart skips every 3rd beat.  She was unsure if Dr. Lovena Le could see her or if she needed another cardiologist.  Advised Pt could be seen by Dr. Lovena Le and he would determine if she needed to be referred to someone else.  Pt very concerned about ECHO results and sob.   Discussed ECHO results with Pt and husband.  Will have Pt f/u with Dr. Lovena Le to assess.  Pt indicates understanding.

## 2017-04-20 ENCOUNTER — Encounter: Payer: Self-pay | Admitting: Internal Medicine

## 2017-04-20 ENCOUNTER — Ambulatory Visit (INDEPENDENT_AMBULATORY_CARE_PROVIDER_SITE_OTHER): Payer: Medicare Other | Admitting: Internal Medicine

## 2017-04-20 ENCOUNTER — Encounter (INDEPENDENT_AMBULATORY_CARE_PROVIDER_SITE_OTHER): Payer: Self-pay

## 2017-04-20 VITALS — BP 142/62 | HR 73 | Resp 16 | Ht 62.0 in | Wt 167.0 lb

## 2017-04-20 DIAGNOSIS — Z79899 Other long term (current) drug therapy: Secondary | ICD-10-CM | POA: Diagnosis not present

## 2017-04-20 DIAGNOSIS — I70213 Atherosclerosis of native arteries of extremities with intermittent claudication, bilateral legs: Secondary | ICD-10-CM

## 2017-04-20 DIAGNOSIS — I495 Sick sinus syndrome: Secondary | ICD-10-CM

## 2017-04-20 LAB — CUP PACEART INCLINIC DEVICE CHECK
Brady Statistic RA Percent Paced: 21 %
Brady Statistic RV Percent Paced: 1 % — CL
Implantable Lead Implant Date: 20171009
Implantable Lead Location: 753860
Implantable Lead Model: 5076
Implantable Lead Model: 7741
Implantable Lead Serial Number: 785430
Implantable Pulse Generator Implant Date: 20171009
Lead Channel Impedance Value: 508 Ohm
Lead Channel Pacing Threshold Amplitude: 1 V
Lead Channel Pacing Threshold Pulse Width: 0.4 ms
Lead Channel Sensing Intrinsic Amplitude: 8.5 mV
Lead Channel Setting Pacing Amplitude: 2 V
Lead Channel Setting Pacing Amplitude: 2.5 V
Lead Channel Setting Pacing Pulse Width: 0.4 ms
Lead Channel Setting Sensing Sensitivity: 2.5 mV
MDC IDC LEAD IMPLANT DT: 20171009
MDC IDC LEAD LOCATION: 753859
MDC IDC MSMT LEADCHNL RA PACING THRESHOLD AMPLITUDE: 0.7 V
MDC IDC MSMT LEADCHNL RA PACING THRESHOLD PULSEWIDTH: 1 ms
MDC IDC MSMT LEADCHNL RA SENSING INTR AMPL: 2 mV
MDC IDC MSMT LEADCHNL RV IMPEDANCE VALUE: 658 Ohm
MDC IDC PG SERIAL: 766467
MDC IDC SESS DTM: 20180924040000

## 2017-04-20 MED ORDER — FUROSEMIDE 40 MG PO TABS: 40.0000 mg | ORAL_TABLET | Freq: Every day | ORAL | 3 refills | Status: DC

## 2017-04-20 MED ORDER — POTASSIUM CHLORIDE CRYS ER 20 MEQ PO TBCR: 20.0000 meq | EXTENDED_RELEASE_TABLET | Freq: Every day | ORAL | 3 refills | Status: DC

## 2017-04-20 NOTE — Progress Notes (Signed)
HPI [name] returns today for ongoing evaluation and management of sinus node dysfunction, status post and diastolic heart failure. He patient was ultimately found to have sinus pauses and underwent permanent pacemaker insertion over a year ago. She has had has dyspnea with exertion Which is her main complaint today. A 2-D echo demonstrates preserved left ventricular systolic function.  Allergies  Allergen Reactions  . Lipitor [Atorvastatin]     Lipitor caused hospitalization - depletion of electrolytes, fever, nausea, loss of appetite, couldn't get out of bed  . Statins     Lipitor caused hospitalization - - depletion of electrolytes, fever, nausea, loss of appetite, couldn't get out of bed  . Cymbalta [Duloxetine Hcl]     Dulled her too much, difficulty urinating, change in vision  . Fosamax [Alendronate Sodium]     Caused chronic issues swallowing  . Gabapentin Other (See Comments)    Dizziness and sedation  . Lyrica [Pregabalin]     Unknown  . Tizanidine Other (See Comments)    Could not sleep     Current Outpatient Prescriptions  Medication Sig Dispense Refill  . AMBULATORY NON FORMULARY MEDICATION 1 Units by Other route daily. Rollator 1 Units 0  . aspirin 81 MG tablet Take 81 mg by mouth daily.    . Calcium Carbonate-Vit D-Min (CALTRATE 600+D PLUS MINERALS PO) Take 2 tablets by mouth 2 (two) times daily.     . cholecalciferol (VITAMIN D) 1000 UNITS tablet Take 2,000 Units by mouth daily.     . Cyanocobalamin (RA VITAMIN B-12 TR) 1000 MCG TBCR Take 1 tablet by mouth every other day.     . esomeprazole (NEXIUM) 40 MG capsule Take 1 capsule (40 mg total) by mouth 2 (two) times daily before a meal. 60 capsule 11  . fluticasone (FLONASE) 50 MCG/ACT nasal spray Place 1 spray into both nostrils daily as needed for allergies.     [name] loratadine (CLARITIN) 10 MG tablet Take 10 mg by mouth daily as needed for allergies.    . naproxen sodium (ALEVE) 220 MG tablet Take 220 mg by  mouth 2 (two) times daily with a meal.     . polyethylene glycol (MIRALAX / GLYCOLAX) packet Take 17 g by mouth daily as needed (constipation).     . ranitidine (ZANTAC) 150 MG tablet Take 1 tablet (150 mg total) by mouth at bedtime as needed for heartburn. 90 tablet 3   No current facility-administered medications for this visit.      Past Medical History:  Diagnosis Date  . Cancer of left breast ([place]) 1978  . Chronic thoracic back pain    "T8; fracture; 03/2015; no OR" (05/05/2016)  . GERD (gastroesophageal reflux disease)   . Heart murmur   . History of hiatal hernia   . Hyperlipidemia   . Hyperplastic colon polyp   . IBS (irritable bowel syndrome)   . Multiple thyroid nodules   . Osteoporosis    T8 compression fx 03/2015   . Personal history of radiation therapy   . Presence of permanent cardiac pacemaker   . Vitamin B12 deficiency     ROS:   All systems reviewed and negative except as noted in the HPI.   Past Surgical History:  Procedure Laterality Date  . ANTERIOR CERVICAL DECOMP/DISCECTOMY FUSION  2008   C5-6  . AUGMENTATION MAMMAPLASTY    . BACK SURGERY    . BREAST SURGERY    . CARDIAC CATHETERIZATION  2001  .  EP IMPLANTABLE DEVICE N/A 05/05/2016   Procedure: Pacemaker Implant;  Surgeon: Evans Lance, MD;  Location: [facility];  Service: Cardiovascular;  Laterality: N/A;  . EXCISIONAL HEMORRHOIDECTOMY  1984   With subsequent correction of surgery  . INSERT / REPLACE / REMOVE PACEMAKER    . KNEE ARTHROSCOPY Left    "meniscus tear"  . MASTECTOMY Left 1978  . PLACEMENT OF BREAST IMPLANTS Left 1981  . SHOULDER ARTHROSCOPY W/ ROTATOR CUFF REPAIR Left 02/2006   "tear"  . TUBAL LIGATION       Family History  Problem Relation Age of Onset  . Diabetes Sister   . Diabetes Brother   . Breast cancer Maternal Aunt   . Colon cancer Neg Hx      Social History   Social History  . Marital status: Married    Spouse name: N/A  . Number of children: N/A    . Years of education: N/A   Occupational History  . Not on file.   Social History Main Topics  . Smoking status: Never Smoker  . Smokeless tobacco: Never Used  . Alcohol use No  . Drug use: No  . Sexual activity: Not Currently   Other Topics Concern  . Not on file   Social History Narrative   Housewife, Lives with spouse, 2 children     BP (!) 142/62   Pulse 73   Resp 16   Ht 5\' 2"  (1.575 m)   Wt 167 lb (75.8 kg)   SpO2 98%   BMI 30.54 kg/m   Physical Exam:  Well appearing 82 year old woman,NAD HEENT: Unremarkable Neck:  7 cm JVD, no thyromegally Lymphatics:  No adenopathy Back:  No CVA tenderness Lungs:  Clear, except for rales in the bases.HEART:  Regular rate rhythm, no murmurs, no rubs, no clicks Abd:  soft, positive bowel sounds, no organomegally, no rebound, no guarding Ext:  2 plus pulses, no edema, no cyanosis, no clubbing Skin:  No rashes no nodules Neuro:  CN II through XII intact, motor grossly intact   DEVICE  Normal device function.  See PaceArt for details.   Assess/Plan: 1. Sinus node dysfunction -  She has had no additional symptoms since her pacemaker was inserted. 2. Pacemaker - her [place] pacemaker is working normally.  We'll recheck in several months. 3. Dyspnea - her symptoms are likely multifactorial. She has evidence of diastolic dyfunction by echo. I've recommended initiation of supplemental lasix and potassium. She will return for labs in a couple of weeks.   Mikle Bosworth.D.

## 2017-04-20 NOTE — Patient Instructions (Addendum)
Medication Instructions:  Your physician has recommended you make the following change in your medication:  1.  Start taking furosemide 40 mg (one tablet) by mouth daily in the morning. 2.  Start taking potassium 20 meq (one tablet) by mouth daily in the morning.  Labwork: Come to the [facility] on May 04, 2017 for a BMP.  Testing/Procedures: None ordered.  Follow-Up: Your physician recommends that you schedule a follow-up appointment in: 3 months with Dr. Lovena Le.    Remote monitoring is used to monitor your Pacemaker from home. This monitoring reduces the number of office visits required to check your device to one time per year. It allows [place] to keep an eye on the functioning of your device to ensure it is working properly. You are scheduled for a device check from home on 05/05/2017. You may send your transmission at any time that day. If you have a wireless device, the transmission will be sent automatically. After your physician reviews your transmission, you will receive a postcard with your next transmission date.     Any Other Special Instructions Will Be Listed Below (If Applicable).     If you need a refill on your cardiac medications before your next appointment, please call your pharmacy.

## 2017-04-24 ENCOUNTER — Telehealth: Payer: Self-pay | Admitting: Internal Medicine

## 2017-04-24 NOTE — Telephone Encounter (Signed)
Call returned to Pt.  Per Pt she had eye blurriness this morning, but it has decreased as the day has worn on.  Notified Pt that per pharmacist this might be a transient symptom and suggests she continue to take furosemide and see if this effect goes away.  Pt agrees to continue to take furosemide over the weekend and this nurse will check in on Monday.  Pt agrees with plan.  Will cont to monitor.

## 2017-04-24 NOTE — Telephone Encounter (Signed)
New Message  Pt c/o medication issue:  1. Name of Medication: furosemide and potassium   2. How are you currently taking this medication (dosage and times per day)? 40mg  and 20mg    3. Are you having a reaction (difficulty breathing--STAT)? Blurred vision   4. What is your medication issue? Pt would like to discuss her reaction with RN. Please call back to discuss

## 2017-04-27 NOTE — Telephone Encounter (Signed)
Call made to Pt.  Pt states she has not had any more blurry vision but she did feel nauseous over the weekend.  She states her breathing seems to be a bit better.  Pt with lab appt 05/04/2017 to check electrolytes, notified Pt to ask for me upon arrival to see how she is doing.  Pt indicates understanding.  Thanked this nurse.

## 2017-05-04 ENCOUNTER — Other Ambulatory Visit: Payer: Medicare Other | Admitting: *Deleted

## 2017-05-04 DIAGNOSIS — Z79899 Other long term (current) drug therapy: Secondary | ICD-10-CM

## 2017-05-04 LAB — BASIC METABOLIC PANEL
BUN/Creatinine Ratio: 18 (ref 12–28)
BUN: 19 mg/dL (ref 8–27)
CALCIUM: 9 mg/dL (ref 8.7–10.3)
CHLORIDE: 101 mmol/L (ref 96–106)
CO2: 22 mmol/L (ref 20–29)
Creatinine, Ser: 1.05 mg/dL — ABNORMAL HIGH (ref 0.57–1.00)
GFR calc Af Amer: 58 mL/min/{1.73_m2} — ABNORMAL LOW (ref >59)
GFR calc non Af Amer: 50 mL/min/{1.73_m2} — ABNORMAL LOW (ref >59)
Glucose: 97 mg/dL (ref 65–99)
POTASSIUM: 4.9 mmol/L (ref 3.5–5.2)
Sodium: 139 mmol/L (ref 134–144)

## 2017-05-05 ENCOUNTER — Ambulatory Visit (INDEPENDENT_AMBULATORY_CARE_PROVIDER_SITE_OTHER): Payer: Medicare Other | Admitting: *Deleted

## 2017-05-05 DIAGNOSIS — I495 Sick sinus syndrome: Secondary | ICD-10-CM

## 2017-05-06 NOTE — Progress Notes (Signed)
Remote pacemaker transmission.   

## 2017-05-08 ENCOUNTER — Encounter: Payer: Self-pay | Admitting: Cardiology

## 2017-05-12 LAB — CUP PACEART REMOTE DEVICE CHECK
Implantable Lead Implant Date: 20171009
Implantable Lead Location: 753859
Implantable Lead Model: 5076
Implantable Lead Model: 7741
Implantable Lead Serial Number: 785430
Implantable Pulse Generator Implant Date: 20171009
Lead Channel Impedance Value: 460 Ohm
Lead Channel Pacing Threshold Amplitude: 1 V
Lead Channel Pacing Threshold Pulse Width: 0.4 ms
Lead Channel Setting Pacing Amplitude: 2.5 V
MDC IDC LEAD IMPLANT DT: 20171009
MDC IDC LEAD LOCATION: 753860
MDC IDC MSMT BATTERY REMAINING LONGEVITY: 168 mo
MDC IDC MSMT BATTERY REMAINING PERCENTAGE: 100 %
MDC IDC MSMT LEADCHNL RV IMPEDANCE VALUE: 680 Ohm
MDC IDC SESS DTM: 20181009070100
MDC IDC SET LEADCHNL RA PACING AMPLITUDE: 2 V
MDC IDC SET LEADCHNL RV PACING PULSEWIDTH: 0.4 ms
MDC IDC SET LEADCHNL RV SENSING SENSITIVITY: 2.5 mV
MDC IDC STAT BRADY RA PERCENT PACED: 11 %
MDC IDC STAT BRADY RV PERCENT PACED: 0 %
Pulse Gen Serial Number: 766467

## 2017-05-21 NOTE — Assessment & Plan Note (Addendum)
Last prolia 7/18 Continue cal/vitamin d Stressed regular exercise

## 2017-05-21 NOTE — Progress Notes (Signed)
Subjective:    Patient ID: [name], female    DOB: 1934/09/04, 82 y.o.   MRN: [id number]  HPI The patient is here for follow up.     GERD:  She is taking her medication daily as prescribed.  She denies any GERD symptoms and feels her GERD is well controlled.  She does follow with Dr Fuller Plan.    Diastolic dysfunction:  She has diastolic dysfunction on her Echo and cardiology started her on lasix and potassium last month for her dyspnea, which Dr Lovena Le feels the cause is multifactorial.   Osteoporosis:  She had her last prolia injection 01/2017.  She is taking calcium and vitamin d daily.  She is exercising - goes to gym.    Prediabetes:  She is compliant with a low sugar/carbohydrate diet.  She is exercising regularly.  Chronic neck and back pain:  She has an appointment with Dr Durene Cal.  He has some pain inher left arm.    Medications and allergies reviewed with patient and updated if appropriate.  Patient Active Problem List   Diagnosis Date Noted  . Claudication of calf muscles ([place]) 11/22/2016  . Dyspnea 11/22/2016  . Diastolic dysfunction 03/47/4259  . Hyperreflexia of lower extremity 07/16/2016  . Bilateral leg paresthesia 07/16/2016  . Carotid arterial disease ([place]) 07/13/2016  . Undiagnosed cardiac murmurs 06/23/2016  . Poor balance 06/23/2016  . Lightheadedness 06/23/2016  . Weakness of both lower extremities 06/23/2016  . Sinus node dysfunction ([place]) 05/05/2016  . Right shoulder pain 03/14/2016  . Multinodular goiter (nontoxic) 02/21/2016  . Neck pain 02/15/2016  . Pain in limb 02/15/2016  . Neck nodule 02/15/2016  . Prediabetes 12/17/2015  . Dysphagia 12/17/2015  . Anxiety 11/13/2015  . T8 vertebral fracture ([place]) 04/09/2015  . Overweight 10/09/2011  . Allergic rhinitis, cause unspecified   . GERD (gastroesophageal reflux disease) 02/05/2011  . EPISTAXIS 07/23/2010  . FATIGUE 09/28/2009  . Vitamin B 12 deficiency 04/17/2009  . Hyperlipidemia  04/17/2009  . Osteoporosis 04/17/2009  . ADENOCARCINOMA, BREAST, HX OF 04/17/2009  . CONSTIPATION 03/08/2009  . COLONIC POLYPS, HYPERPLASTIC, HX OF 03/06/2009    Current Outpatient Prescriptions on File Prior to Visit  Medication Sig Dispense Refill  . AMBULATORY NON FORMULARY MEDICATION 1 Units by Other route daily. Rollator 1 Units 0  . aspirin 81 MG tablet Take 81 mg by mouth daily.    . Calcium Carbonate-Vit D-Min (CALTRATE 600+D PLUS MINERALS PO) Take 2 tablets by mouth 2 (two) times daily.     . cholecalciferol (VITAMIN D) 1000 UNITS tablet Take 2,000 Units by mouth daily.     . Cyanocobalamin (RA VITAMIN B-12 TR) 1000 MCG TBCR Take 1 tablet by mouth every other day.     . esomeprazole (NEXIUM) 40 MG capsule Take 1 capsule (40 mg total) by mouth 2 (two) times daily before a meal. 60 capsule 11  . fluticasone (FLONASE) 50 MCG/ACT nasal spray Place 1 spray into both nostrils daily as needed for allergies.     . furosemide (LASIX) 40 MG tablet Take 1 tablet (40 mg total) by mouth daily. 90 tablet 3  . loratadine (CLARITIN) 10 MG tablet Take 10 mg by mouth daily as needed for allergies.    . naproxen sodium (ALEVE) 220 MG tablet Take 220 mg by mouth 2 (two) times daily with a meal.     . polyethylene glycol (MIRALAX / GLYCOLAX) packet Take 17 g by mouth daily as needed (constipation).     [name]  potassium chloride SA (K-DUR,KLOR-CON) 20 MEQ tablet Take 1 tablet (20 mEq total) by mouth daily. 90 tablet 3  . ranitidine (ZANTAC) 150 MG tablet Take 1 tablet (150 mg total) by mouth at bedtime as needed for heartburn. 90 tablet 3   No current facility-administered medications on file prior to visit.     Past Medical History:  Diagnosis Date  . Cancer of left breast ([place]) 1978  . Chronic thoracic back pain    "T8; fracture; 03/2015; no OR" (05/05/2016)  . GERD (gastroesophageal reflux disease)   . Heart murmur   . History of hiatal hernia   . Hyperlipidemia   . Hyperplastic colon polyp   .  IBS (irritable bowel syndrome)   . Multiple thyroid nodules   . Osteoporosis    T8 compression fx 03/2015   . Personal history of radiation therapy   . Presence of permanent cardiac pacemaker   . Vitamin B12 deficiency     Past Surgical History:  Procedure Laterality Date  . ANTERIOR CERVICAL DECOMP/DISCECTOMY FUSION  2008   C5-6  . AUGMENTATION MAMMAPLASTY    . BACK SURGERY    . BREAST SURGERY    . CARDIAC CATHETERIZATION  2001  . EP IMPLANTABLE DEVICE N/A 05/05/2016   Procedure: Pacemaker Implant;  Surgeon: Evans Lance, MD;  Location: [facility];  Service: Cardiovascular;  Laterality: N/A;  . EXCISIONAL HEMORRHOIDECTOMY  1984   With subsequent correction of surgery  . INSERT / REPLACE / REMOVE PACEMAKER    . KNEE ARTHROSCOPY Left    "meniscus tear"  . MASTECTOMY Left 1978  . PLACEMENT OF BREAST IMPLANTS Left 1981  . SHOULDER ARTHROSCOPY W/ ROTATOR CUFF REPAIR Left 02/2006   "tear"  . TUBAL LIGATION      Social History   Social History  . Marital status: Married    Spouse name: N/A  . Number of children: N/A  . Years of education: N/A   Social History Main Topics  . Smoking status: Never Smoker  . Smokeless tobacco: Never Used  . Alcohol use No  . Drug use: No  . Sexual activity: Not Currently   Other Topics Concern  . None   Social History Narrative   Housewife, Lives with spouse, 2 children    Family History  Problem Relation Age of Onset  . Diabetes Sister   . Diabetes Brother   . Breast cancer Maternal Aunt   . Colon cancer Neg Hx     Review of Systems  Constitutional: Negative for chills and fever.  HENT: Positive for trouble swallowing.   Respiratory: Positive for cough (occasional) and shortness of breath (if she walks). Negative for wheezing.   Cardiovascular: Negative for chest pain, palpitations and leg swelling.  Gastrointestinal: Negative for abdominal pain.       GERD controlled  Musculoskeletal: Positive for back pain and  neck pain.  Neurological: Negative for light-headedness and headaches.       Objective:   Vitals:   05/22/17 0834  BP: 128/62  Pulse: 71  Resp: 16  Temp: 98 F (36.7 C)  SpO2: 98%   Wt Readings from Last 3 Encounters:  05/22/17 164 lb (74.4 kg)  04/20/17 167 lb (75.8 kg)  01/26/17 164 lb (74.4 kg)   Body mass index is 30 kg/m.   Physical Exam    Constitutional: Appears well-developed and well-nourished. No distress.  HENT:  Head: Normocephalic and atraumatic.  Neck: Neck supple. No tracheal deviation present. No thyromegaly  present.  No cervical lymphadenopathy Cardiovascular: Normal rate, regular rhythm and normal heart sounds.   No murmur heard. No carotid bruit .  No edema Pulmonary/Chest: Effort normal and breath sounds normal. No respiratory distress. No has no wheezes. No rales.  Skin: Skin is warm and dry. Not diaphoretic.  Psychiatric: Normal mood and affect. Behavior is normal.      Assessment & Plan:    See Problem List for Assessment and Plan of chronic medical problems.

## 2017-05-21 NOTE — Patient Instructions (Addendum)
Your a1c was checked today.    All other Health Maintenance issues reviewed.   All recommended immunizations and age-appropriate screenings are up-to-date or discussed.  Flu immunization administered today.   Medications reviewed and updated.  No changes recommended at this time.    Please followup in 6 months

## 2017-05-22 ENCOUNTER — Ambulatory Visit (INDEPENDENT_AMBULATORY_CARE_PROVIDER_SITE_OTHER): Payer: Medicare Other | Admitting: Internal Medicine

## 2017-05-22 ENCOUNTER — Encounter: Payer: Self-pay | Admitting: Internal Medicine

## 2017-05-22 VITALS — BP 128/62 | HR 71 | Temp 98.0°F | Resp 16 | Wt 164.0 lb

## 2017-05-22 DIAGNOSIS — M810 Age-related osteoporosis without current pathological fracture: Secondary | ICD-10-CM | POA: Diagnosis not present

## 2017-05-22 DIAGNOSIS — Z23 Encounter for immunization: Secondary | ICD-10-CM | POA: Diagnosis not present

## 2017-05-22 DIAGNOSIS — I70213 Atherosclerosis of native arteries of extremities with intermittent claudication, bilateral legs: Secondary | ICD-10-CM | POA: Diagnosis not present

## 2017-05-22 DIAGNOSIS — R7303 Prediabetes: Secondary | ICD-10-CM

## 2017-05-22 DIAGNOSIS — K219 Gastro-esophageal reflux disease without esophagitis: Secondary | ICD-10-CM | POA: Diagnosis not present

## 2017-05-22 DIAGNOSIS — I519 Heart disease, unspecified: Secondary | ICD-10-CM

## 2017-05-22 DIAGNOSIS — I5189 Other ill-defined heart diseases: Secondary | ICD-10-CM

## 2017-05-22 LAB — POCT GLYCOSYLATED HEMOGLOBIN (HGB A1C): Hemoglobin A1C: 5.9

## 2017-05-22 NOTE — Assessment & Plan Note (Signed)
Following with Dr Lovena Le Taking lasix and potassium She denies any changes in SOB since starting medication

## 2017-05-22 NOTE — Assessment & Plan Note (Signed)
Check a1c Low sugar / carb diet Stressed regular exercise   

## 2017-05-22 NOTE — Assessment & Plan Note (Addendum)
GERD controlled Following with Dr Fuller Plan Continue daily medication

## 2017-05-26 DIAGNOSIS — Z6830 Body mass index (BMI) 30.0-30.9, adult: Secondary | ICD-10-CM | POA: Diagnosis not present

## 2017-05-26 DIAGNOSIS — M47812 Spondylosis without myelopathy or radiculopathy, cervical region: Secondary | ICD-10-CM | POA: Diagnosis not present

## 2017-05-26 DIAGNOSIS — Z981 Arthrodesis status: Secondary | ICD-10-CM | POA: Diagnosis not present

## 2017-05-26 DIAGNOSIS — S22060A Wedge compression fracture of T7-T8 vertebra, initial encounter for closed fracture: Secondary | ICD-10-CM | POA: Diagnosis not present

## 2017-05-26 DIAGNOSIS — M546 Pain in thoracic spine: Secondary | ICD-10-CM | POA: Diagnosis not present

## 2017-05-26 DIAGNOSIS — M542 Cervicalgia: Secondary | ICD-10-CM | POA: Diagnosis not present

## 2017-05-26 DIAGNOSIS — R2689 Other abnormalities of gait and mobility: Secondary | ICD-10-CM | POA: Diagnosis not present

## 2017-05-28 ENCOUNTER — Other Ambulatory Visit: Payer: Self-pay | Admitting: Neurosurgery

## 2017-05-28 DIAGNOSIS — M542 Cervicalgia: Secondary | ICD-10-CM

## 2017-05-28 DIAGNOSIS — H25813 Combined forms of age-related cataract, bilateral: Secondary | ICD-10-CM | POA: Diagnosis not present

## 2017-05-28 DIAGNOSIS — H35373 Puckering of macula, bilateral: Secondary | ICD-10-CM | POA: Diagnosis not present

## 2017-05-28 DIAGNOSIS — H01001 Unspecified blepharitis right upper eyelid: Secondary | ICD-10-CM | POA: Diagnosis not present

## 2017-05-28 DIAGNOSIS — S22060A Wedge compression fracture of T7-T8 vertebra, initial encounter for closed fracture: Secondary | ICD-10-CM

## 2017-05-28 DIAGNOSIS — H524 Presbyopia: Secondary | ICD-10-CM | POA: Diagnosis not present

## 2017-05-28 DIAGNOSIS — H01002 Unspecified blepharitis right lower eyelid: Secondary | ICD-10-CM | POA: Diagnosis not present

## 2017-05-28 DIAGNOSIS — H01004 Unspecified blepharitis left upper eyelid: Secondary | ICD-10-CM | POA: Diagnosis not present

## 2017-05-29 ENCOUNTER — Other Ambulatory Visit: Payer: Self-pay | Admitting: Gastroenterology

## 2017-06-08 ENCOUNTER — Ambulatory Visit
Admission: RE | Admit: 2017-06-08 | Discharge: 2017-06-08 | Disposition: A | Discharge status: Home / Self Care | Payer: Medicare Other | Source: Ambulatory Visit | Attending: Neurosurgery | Admitting: Neurosurgery

## 2017-06-08 DIAGNOSIS — M542 Cervicalgia: Secondary | ICD-10-CM

## 2017-06-08 DIAGNOSIS — M5124 Other intervertebral disc displacement, thoracic region: Secondary | ICD-10-CM | POA: Diagnosis not present

## 2017-06-08 DIAGNOSIS — S22060A Wedge compression fracture of T7-T8 vertebra, initial encounter for closed fracture: Secondary | ICD-10-CM

## 2017-06-08 DIAGNOSIS — M50223 Other cervical disc displacement at C6-C7 level: Secondary | ICD-10-CM | POA: Diagnosis not present

## 2017-06-08 MED ORDER — DIAZEPAM 5 MG PO TABS
5.0000 mg | ORAL_TABLET | Freq: Once | ORAL | Status: AC
  Administered 2017-06-08: 5 mg via ORAL

## 2017-06-08 MED ORDER — ONDANSETRON HCL 4 MG/2ML IJ SOLN: 4.0000 mg | Freq: Four times a day (QID) | INTRAMUSCULAR | Status: DC | PRN

## 2017-06-08 MED ORDER — IOPAMIDOL (ISOVUE-M 300) INJECTION 61%
10.0000 mL | Freq: Once | INTRAMUSCULAR | Status: AC | PRN
  Administered 2017-06-08: 10 mL via INTRATHECAL

## 2017-06-08 NOTE — Discharge Instructions (Signed)

## 2017-06-12 ENCOUNTER — Telehealth: Payer: Self-pay | Admitting: Internal Medicine

## 2017-06-12 DIAGNOSIS — Z981 Arthrodesis status: Secondary | ICD-10-CM | POA: Diagnosis not present

## 2017-06-12 DIAGNOSIS — M542 Cervicalgia: Secondary | ICD-10-CM | POA: Diagnosis not present

## 2017-06-12 DIAGNOSIS — M546 Pain in thoracic spine: Secondary | ICD-10-CM | POA: Diagnosis not present

## 2017-06-12 DIAGNOSIS — S22060A Wedge compression fracture of T7-T8 vertebra, initial encounter for closed fracture: Secondary | ICD-10-CM | POA: Diagnosis not present

## 2017-06-12 DIAGNOSIS — R2689 Other abnormalities of gait and mobility: Secondary | ICD-10-CM | POA: Diagnosis not present

## 2017-06-12 NOTE — Telephone Encounter (Signed)
Pt called in and said that she seen neuo and are telling her that she need to see Dr Posey Pronto?  Can we put  A referral in for her to see Dr Posey Pronto with seeing her?

## 2017-06-15 NOTE — Telephone Encounter (Signed)
Is she referring to Dr Rita Ohara  - he recommended she see Dr Posey Pronto for her neck pain?  Please confirm -- I will order a referral.

## 2017-06-16 NOTE — Telephone Encounter (Signed)
Spoke with pt, referral needs to be placed for Dr Posey Pronto.

## 2017-06-17 NOTE — Telephone Encounter (Signed)
Referral ordered

## 2017-06-26 ENCOUNTER — Encounter: Payer: Self-pay | Admitting: Neurology

## 2017-06-26 ENCOUNTER — Ambulatory Visit (INDEPENDENT_AMBULATORY_CARE_PROVIDER_SITE_OTHER): Payer: Medicare Other | Admitting: Neurology

## 2017-06-26 VITALS — BP 120/74 | HR 78 | Ht 62.0 in | Wt 164.5 lb

## 2017-06-26 DIAGNOSIS — I70213 Atherosclerosis of native arteries of extremities with intermittent claudication, bilateral legs: Secondary | ICD-10-CM | POA: Diagnosis not present

## 2017-06-26 DIAGNOSIS — G8929 Other chronic pain: Secondary | ICD-10-CM | POA: Diagnosis not present

## 2017-06-26 DIAGNOSIS — M542 Cervicalgia: Secondary | ICD-10-CM

## 2017-06-26 NOTE — Progress Notes (Signed)
Follow-up Visit   Date: 06/26/17    [name] MRN: [id number] DOB: 08-19-1934   Interim History: [name] is a 82 y.o. right-handed Caucasian female with anxiety, GERD, s/p C5-6 ACDF, sinus node dysfunction s/p PPM, and osteoporesis returning to the clinic with complaints of neck pain.  The patient was accompanied to the clinic by husband who also provides collateral information.    History of present illness: Initial visit 03/11/2016 for spells:  Since September 2016, she began having spells of wave of abnormal sensation in her head, feels dazed.  These spells are brief, lasting about 7-10 seconds and occurring sporadically up to several times per hour, but recently are occurring less frequently.  Husband says that she is unresponsive with open eyes and has a flat expression.  She denies loss of consciousness and is able to hear him.  Holter monitor showed sinus node dysfunction and sinus pause and she underwent PPM.  In 2016, she was found to have acute T8 compression fracture and attributes it to doing core exercises at the [facility], because she has not sustained any falls.    She has a long history of neck pain radiating to her head, described as spasm and pulling sensation. She has previously underwent C5-6 ACDF by Dr. Sherwood Gambler.  She was taking Cymbalta for anxiety and noticed that it also helped with her neck discomfort, but has discontinued this due to side effects.  She occasionally has numbness in her left arm.    UPDATE 07/16/2016:  She returns for follow-up of bilateral leg hypersensitivity over the thighs and legs and gait unsteadiness.  Sometimes, she feels unsteady when she is trying to get up too quickly and her legs buckles.  She has not suffered any falls.  She walks independently, but often holds onto her husband for support.  She denies any numbness/tingling of the legs or feet.  She does not have radiating pain down her legs.  The hypersensitivity is usually  in associated with low back and neck pain and worse with prolonged walking, standing, and sitting.  She often uses a cart at the grocery store and feels that she is able to walk longer without taking as many breaks.  At church, she is unable to walk far without having to take frequent rest breaks.  After she rests, she feels much better and is able to walk again for a bit.   CT lumbar spine showed mild bilateral foraminal stenosis at L5-S1, worse on the right.  PT helped with back pain and her legs stopped buckling; however, in February, she again started having bilateral lower leg pain triggered by walking 10-20 minutes, described as achy and burning sensation.  Symptoms were alleviated with rest within a few minutes and she could resume walking for another 10-20 minutes.    UPDATE 06/26/2017:   She is here for urgent visit of neck pain.  Her neck pain has been chronic and she recently had extensive evaluation with CT myelogram by Dr. Sherwood Gambler which did not show any nerve impingement.  She has known thoracic T8 compression fracture.  She continues to complains of sharp and achy pain of the anterior and posterior portion of the neck, as well as left shoulder. She has brief episode of sharp pain which radiate from her neck into her eye sometimes.  Other times, it can radiate from her left shoulder into her neck.  She did not tolerate gabapentin, Lyrica or Cymbalta. She tried tizanidine 2mg  but  it made her stay awake all night.    She has many complaints including shortness of breath, exertional fatigue, and generalized feeling of malaise.  Medications:  Current Outpatient Medications on File Prior to Visit  Medication Sig Dispense Refill  . acetaminophen (TYLENOL) 500 MG tablet Take 500 mg by mouth 2 (two) times daily.    . AMBULATORY NON FORMULARY MEDICATION 1 Units by Other route daily. Rollator 1 Units 0  . aspirin 81 MG tablet Take 81 mg by mouth daily.    . Calcium Carbonate-Vit D-Min (CALTRATE 600+D  PLUS MINERALS PO) Take 2 tablets by mouth 2 (two) times daily.     . cholecalciferol (VITAMIN D) 1000 UNITS tablet Take 2,000 Units by mouth daily.     . Cyanocobalamin (RA VITAMIN B-12 TR) 1000 MCG TBCR Take 1 tablet by mouth every other day.     . esomeprazole (NEXIUM) 40 MG capsule TAKE 1 CAPSULE TWICE DAILY BEFORE MEALS 60 capsule 0  . fluticasone (FLONASE) 50 MCG/ACT nasal spray Place 1 spray into both nostrils daily as needed for allergies.     . furosemide (LASIX) 40 MG tablet Take 1 tablet (40 mg total) by mouth daily. 90 tablet 3  . loratadine (CLARITIN) 10 MG tablet Take 10 mg by mouth daily as needed for allergies.    . polyethylene glycol (MIRALAX / GLYCOLAX) packet Take 17 g by mouth daily as needed (constipation).     . potassium chloride SA (K-DUR,KLOR-CON) 20 MEQ tablet Take 1 tablet (20 mEq total) by mouth daily. 90 tablet 3  . ranitidine (ZANTAC) 150 MG tablet Take 1 tablet (150 mg total) by mouth at bedtime as needed for heartburn. 90 tablet 3   No current facility-administered medications on file prior to visit.     Allergies:  Allergies  Allergen Reactions  . Lipitor [Atorvastatin]     Lipitor caused hospitalization - depletion of electrolytes, fever, nausea, loss of appetite, couldn't get out of bed  . Statins     Lipitor caused hospitalization - - depletion of electrolytes, fever, nausea, loss of appetite, couldn't get out of bed  . Cymbalta [Duloxetine Hcl]     Dulled her too much, difficulty urinating, change in vision  . Fosamax [Alendronate Sodium]     Caused chronic issues swallowing  . Lyrica [Pregabalin]     Unknown  . Neurontin [Gabapentin] Other (See Comments)    Dizziness and sedation  . Zanaflex [Tizanidine] Other (See Comments)    Could not sleep    Review of Systems:  CONSTITUTIONAL: No fevers, chills, night sweats, or weight loss.  EYES: No visual changes or eye pain ENT: No hearing changes.  No history of nose bleeds.   RESPIRATORY: No cough,  wheezing +shortness of breath.   CARDIOVASCULAR: Negative for chest pain, and palpitations.   GI: Negative for abdominal discomfort, blood in stools or black stools.  No recent change in bowel habits.   GU:  No history of incontinence.   MUSCLOSKELETAL: +history of joint pain or swelling.  No myalgias.   SKIN: Negative for lesions, rash, and itching.   ENDOCRINE: Negative for cold or heat intolerance, polydipsia or goiter.   PSYCH:  No depression or anxiety symptoms.   NEURO: As Above.   Vital Signs:  BP 120/74   Pulse 78   Ht 5\' 2"  (1.575 m)   Wt 164 lb 8 oz (74.6 kg)   SpO2 95%   BMI 30.09 kg/m   General Medical Exam:  General:  Well appearing, comfortable  Eyes/ENT: see cranial nerve examination.   Neck: No masses appreciated.  Full range of motion without tenderness.  No carotid bruits. Respiratory:  Clear to auscultation, good air entry bilaterally.   Cardiac:  Regular rate and rhythm, no murmur.   Ext:  No edema  Neurological Exam: MENTAL STATUS including orientation to time, place, person, recent and remote memory, attention span and concentration, language, and fund of knowledge is normal.  Speech is slow, not dysarthric.  CRANIAL NERVES:   Pupils equal round and reactive to light.  Normal conjugate, extra-ocular eye movements in all directions of gaze.  No ptosis.  Face is symmetric. Palate elevates symmetrically.  Tongue is midline.  MOTOR:  Motor strength is 5/5 in all extremities.  No atrophy, fasciculations or abnormal movements.  No pronator drift.  Tone is normal.    MSRs:  Reflexes are 2+/4 throughout, except 3+/4 bilateral achilles reflex.  SENSORY:  Intact to vibration and temperature throughout.  COORDINATION/GAIT: Gait is slightly wide-based, stable and unassisted.    Data: MRI brain wwo contrast 03/20/2016: normal Routine EEG 03/24/2016:  Normal   CT lumbar spine wo contrast 07/17/2016:  1. Slightly progressive disc degeneration at L5-S1 with  mild-to-moderate right and mild left neural foraminal stenosis. 2. Minimal lumbar spondylosis elsewhere without significant stenosis.  CT myelogram cervical spine 06/08/2017:   1. Solid C5-6 ACDF without residual stenosis. 2. Mildly progressive disc degeneration at C6-7 resulting in mild spinal stenosis. 3. Chronic T8 compression fracture with progressive, severe vertebral body height loss. Minimal retropulsion and slight cord flattening without significant stenosis. 4. Unchanged tiny T4-5 disc protrusion without stenosis. 5.  Aortic Atherosclerosis (ICD10-I70.0).  IMPRESSION/PLAN: 1.  Chronic neck pain.  She underwent CT myelogram on 11/12 which showed intact C5-6 fusion without any evidence of foraminal or canal stenosis.  The diffuse nature of her pain is very atypical to be stemming from a primary neurological etiology.  She may be developing more of a chronic pain syndrome.  I will order NCS/EMG of the arms to be sure there is no evidence of peripheral nerve pathology, but my overall suspicion is low because symptoms do not conform to a nerve distribution.   Recommend restarting neck physiotherapy.  Take tizanidine 2mg  in the morning, as she developed paradoxical insomnia with this.  If no improvement with PT and muscle relaxants, she will be referred to pain management   2.  Known T8 compression fracture, which may explain her midback pain.    3.  Sinus node dysfunction s/p PPM, followed by cardiology.     Greater than 50% of this 30 minute visit was spent in counseling, explanation of diagnosis, planning of further management, and coordination of care.    Thank you for allowing me to participate in patient's care.  If I can answer any additional questions, I would be pleased to do so.    Sincerely,    Keondra Haydu K. Posey Pronto, DO

## 2017-06-26 NOTE — Patient Instructions (Signed)
1.  Start neck physiotherapy 2.  NCS/EMG of the arms 3.  Start tizanidine 2mg  in the morning

## 2017-07-08 DIAGNOSIS — M542 Cervicalgia: Secondary | ICD-10-CM | POA: Diagnosis not present

## 2017-07-08 DIAGNOSIS — M546 Pain in thoracic spine: Secondary | ICD-10-CM | POA: Diagnosis not present

## 2017-07-09 ENCOUNTER — Ambulatory Visit (INDEPENDENT_AMBULATORY_CARE_PROVIDER_SITE_OTHER): Payer: Medicare Other | Admitting: Neurology

## 2017-07-09 DIAGNOSIS — M79601 Pain in right arm: Secondary | ICD-10-CM

## 2017-07-09 DIAGNOSIS — M79602 Pain in left arm: Secondary | ICD-10-CM

## 2017-07-09 DIAGNOSIS — M542 Cervicalgia: Secondary | ICD-10-CM | POA: Diagnosis not present

## 2017-07-09 NOTE — Procedures (Signed)
[facility]  [place]  [place], [place] [address] Tel: [phone] Fax:  [phone] Test Date:  07/09/2017  Patient: [name] DOB: 03-07-1935 Physician: Narda Amber, DO  Sex: Female Height: 5\' 2"  Ref Phys: Narda Amber, DO  ID#: [id number] Temp: 32.6C Technician:    Patient Complaints: This is a 82 year-old female referred for evaluation of chronic neck and arm pain.  NCV & EMG Findings: Extensive electrodiagnostic testing of the right upper extremity and additional studies of the left shows:  1. Bilateral median and ulnar sensory responses are within normal limits. 2. Bilateral median and ulnar motor responses are within normal limits. 3. There is no evidence of active or chronic motor axon loss changes affecting any of the tested muscles. Motor unit configuration and recruitment pattern is within normal limits.  Impression: This is a normal study of the upper extremities. In particular, there is no evidence of a sensorimotor polyneuropathy, entrapment neuropathy, or cervical radiculopathy.   ___________________________ Narda Amber, DO    Nerve Conduction Studies Anti Sensory Summary Table   Stim Site NR Peak (ms) Norm Peak (ms) P-T Amp (V) Norm P-T Amp  Left Median Anti Sensory (2nd Digit)  Wrist    3.2 <3.8 31.1 >10  Right Median Anti Sensory (2nd Digit)  Wrist    2.9 <3.8 34.3 >10  Left Ulnar Anti Sensory (5th Digit)  Wrist    3.0 <3.2 32.6 >5  Right Ulnar Anti Sensory (5th Digit)  Wrist    2.5 <3.2 34.2 >5   Motor Summary Table   Stim Site NR Onset (ms) Norm Onset (ms) O-P Amp (mV) Norm O-P Amp Site1 Site2 Delta-0 (ms) Dist (cm) Vel (m/s) Norm Vel (m/s)  Left Median Motor (Abd Poll Brev)  Wrist    2.7 <4.0 9.2 >5 Elbow Wrist 4.8 27.0 56 >50  Elbow    7.5  8.7         Right Median Motor (Abd Poll Brev)  Wrist    2.6 <4.0 7.0 >5 Elbow Wrist 4.9 27.0 55 >50  Elbow    7.5  6.8         Left Ulnar Motor (Abd Dig Minimi)    Wrist    2.7 <3.1 9.5 >7 B Elbow Wrist 3.0 22.0 73 >50  B Elbow    5.7  9.1  A Elbow B Elbow 1.4 10.0 71 >50  A Elbow    7.1  8.5         Right Ulnar Motor (Abd Dig Minimi)  Wrist    2.3 <3.1 9.4 >7 B Elbow Wrist 3.4 23.0 68 >50  B Elbow    5.7  9.2  A Elbow B Elbow 1.4 10.0 71 >50  A Elbow    7.1  8.8          EMG   Side Muscle Ins Act Fibs Psw Fasc Number Recrt Dur Dur. Amp Amp. Poly Poly. Comment  Left 1stDorInt Nml Nml Nml Nml Nml Nml Nml Nml Nml Nml Nml Nml N/A  Left PronatorTeres Nml Nml Nml Nml Nml Nml Nml Nml Nml Nml Nml Nml N/A  Left Biceps Nml Nml Nml Nml Nml Nml Nml Nml Nml Nml Nml Nml N/A  Left Triceps Nml Nml Nml Nml Nml Nml Nml Nml Nml Nml Nml Nml N/A  Left Deltoid Nml Nml Nml Nml Nml Nml Nml Nml Nml Nml Nml Nml N/A  Right 1stDorInt Nml Nml Nml Nml Nml Nml Nml Nml Nml  Nml Nml Nml N/A  Right PronatorTeres Nml Nml Nml Nml Nml Nml Nml Nml Nml Nml Nml Nml N/A  Right Biceps Nml Nml Nml Nml Nml Nml Nml Nml Nml Nml Nml Nml N/A  Right Triceps Nml Nml Nml Nml Nml Nml Nml Nml Nml Nml Nml Nml N/A  Right Deltoid Nml Nml Nml Nml Nml Nml Nml Nml Nml Nml Nml Nml N/A      Waveforms:

## 2017-07-13 DIAGNOSIS — M546 Pain in thoracic spine: Secondary | ICD-10-CM | POA: Diagnosis not present

## 2017-07-13 DIAGNOSIS — M542 Cervicalgia: Secondary | ICD-10-CM | POA: Diagnosis not present

## 2017-07-14 ENCOUNTER — Telehealth: Payer: Self-pay | Admitting: Gastroenterology

## 2017-07-14 MED ORDER — ESOMEPRAZOLE MAGNESIUM 40 MG PO CPDR: DELAYED_RELEASE_CAPSULE | ORAL | 0 refills | Status: DC

## 2017-07-14 NOTE — Telephone Encounter (Signed)
Nexium rx sent to patient's pharmacy and when we receive rejection we will start PA. Patient informed and she scheduled yearly f/u for 09/01/17 at 2:45pm.

## 2017-07-15 DIAGNOSIS — M546 Pain in thoracic spine: Secondary | ICD-10-CM | POA: Diagnosis not present

## 2017-07-15 DIAGNOSIS — M542 Cervicalgia: Secondary | ICD-10-CM | POA: Diagnosis not present

## 2017-07-16 ENCOUNTER — Telehealth: Payer: Self-pay | Admitting: Neurology

## 2017-07-16 NOTE — Telephone Encounter (Signed)
[place] called needing to check on some paperwork faxed over on this patient. Please call. Thanks

## 2017-07-20 NOTE — Telephone Encounter (Signed)
Called Brandy back and she needs POC signed.  She will refax copy.

## 2017-07-23 ENCOUNTER — Ambulatory Visit (INDEPENDENT_AMBULATORY_CARE_PROVIDER_SITE_OTHER): Payer: Medicare Other | Admitting: Internal Medicine

## 2017-07-23 ENCOUNTER — Encounter: Payer: Self-pay | Admitting: Internal Medicine

## 2017-07-23 VITALS — BP 150/70 | HR 62 | Ht 62.5 in | Wt 165.4 lb

## 2017-07-23 DIAGNOSIS — Z950 Presence of cardiac pacemaker: Secondary | ICD-10-CM

## 2017-07-23 DIAGNOSIS — I495 Sick sinus syndrome: Secondary | ICD-10-CM | POA: Diagnosis not present

## 2017-07-23 DIAGNOSIS — I200 Unstable angina: Secondary | ICD-10-CM

## 2017-07-23 DIAGNOSIS — R6884 Jaw pain: Secondary | ICD-10-CM

## 2017-07-23 NOTE — Progress Notes (Signed)
HPI [name] returns today for followup of her PPM. She is a pleasant 82 yo woman with HTN, sinus node dysfunction, peripheral vascular disease and is s/p PPM insertion over a year ago. Her main complaint today involves neck and jaw pain and sob that comes on with exertion and goes away when she sits down to rest. She has had a remote heart cath that she says was unremarkable. Her symptoms have been present for several weeks.  Allergies  Allergen Reactions  . Lipitor [Atorvastatin]     Lipitor caused hospitalization - depletion of electrolytes, fever, nausea, loss of appetite, couldn't get out of bed  . Statins     Lipitor caused hospitalization - - depletion of electrolytes, fever, nausea, loss of appetite, couldn't get out of bed  . Cymbalta [Duloxetine Hcl]     Dulled her too much, difficulty urinating, change in vision  . Fosamax [Alendronate Sodium]     Caused chronic issues swallowing  . Lyrica [Pregabalin]     Unknown  . Neurontin [Gabapentin] Other (See Comments)    Dizziness and sedation  . Zanaflex [Tizanidine] Other (See Comments)    Could not sleep     Current Outpatient Medications  Medication Sig Dispense Refill  . acetaminophen (TYLENOL) 500 MG tablet Take 500 mg by mouth 2 (two) times daily.    . AMBULATORY NON FORMULARY MEDICATION 1 Units by Other route daily. Rollator 1 Units 0  . aspirin 81 MG tablet Take 81 mg by mouth daily.    . Calcium Carbonate-Vit D-Min (CALTRATE 600+D PLUS MINERALS PO) Take 2 tablets by mouth 2 (two) times daily.     . cholecalciferol (VITAMIN D) 1000 UNITS tablet Take 2,000 Units by mouth daily.     . Cyanocobalamin (RA VITAMIN B-12 TR) 1000 MCG TBCR Take 1 tablet by mouth every other day.     . esomeprazole (NEXIUM) 40 MG capsule TAKE 1 CAPSULE TWICE DAILY BEFORE MEALS 60 capsule 0  . fluticasone (FLONASE) 50 MCG/ACT nasal spray Place 1 spray into both nostrils daily as needed for allergies.     [name] loratadine (CLARITIN) 10 MG  tablet Take 10 mg by mouth daily as needed for allergies.    . polyethylene glycol (MIRALAX / GLYCOLAX) packet Take 17 g by mouth daily as needed (constipation).     . potassium chloride SA (K-DUR,KLOR-CON) 20 MEQ tablet Take 1 tablet (20 mEq total) by mouth daily. 90 tablet 3  . ranitidine (ZANTAC) 150 MG tablet Take 1 tablet (150 mg total) by mouth at bedtime as needed for heartburn. 90 tablet 3  . furosemide (LASIX) 40 MG tablet Take 1 tablet (40 mg total) by mouth daily. 90 tablet 3   No current facility-administered medications for this visit.      Past Medical History:  Diagnosis Date  . Cancer of left breast ([place]) 1978  . Chronic thoracic back pain    "T8; fracture; 03/2015; no OR" (05/05/2016)  . GERD (gastroesophageal reflux disease)   . Heart murmur   . History of hiatal hernia   . Hyperlipidemia   . Hyperplastic colon polyp   . IBS (irritable bowel syndrome)   . Multiple thyroid nodules   . Osteoporosis    T8 compression fx 03/2015   . Personal history of radiation therapy   . Presence of permanent cardiac pacemaker   . Vitamin B12 deficiency     ROS:   All systems reviewed and negative except as noted in  the HPI.   Past Surgical History:  Procedure Laterality Date  . ANTERIOR CERVICAL DECOMP/DISCECTOMY FUSION  2008   C5-6  . AUGMENTATION MAMMAPLASTY    . BACK SURGERY    . BREAST SURGERY    . CARDIAC CATHETERIZATION  2001  . EP IMPLANTABLE DEVICE N/A 05/05/2016   Procedure: Pacemaker Implant;  Surgeon: Evans Lance, MD;  Location: [facility];  Service: Cardiovascular;  Laterality: N/A;  . EXCISIONAL HEMORRHOIDECTOMY  1984   With subsequent correction of surgery  . INSERT / REPLACE / REMOVE PACEMAKER    . KNEE ARTHROSCOPY Left    "meniscus tear"  . MASTECTOMY Left 1978  . PLACEMENT OF BREAST IMPLANTS Left 1981  . SHOULDER ARTHROSCOPY W/ ROTATOR CUFF REPAIR Left 02/2006   "tear"  . TUBAL LIGATION       Family History  Problem Relation Age of  Onset  . Diabetes Sister   . Diabetes Brother   . Breast cancer Maternal Aunt   . Colon cancer Neg Hx      Social History   Socioeconomic History  . Marital status: Married    Spouse name: Not on file  . Number of children: Not on file  . Years of education: Not on file  . Highest education level: Not on file  Social Needs  . Financial resource strain: Not on file  . Food insecurity - worry: Not on file  . Food insecurity - inability: Not on file  . Transportation needs - medical: Not on file  . Transportation needs - non-medical: Not on file  Occupational History  . Not on file  Tobacco Use  . Smoking status: Never Smoker  . Smokeless tobacco: Never Used  Substance and Sexual Activity  . Alcohol use: No    Alcohol/week: 0.0 oz  . Drug use: No  . Sexual activity: Not Currently  Other Topics Concern  . Not on file  Social History Narrative   Housewife, Lives with spouse, 2 children     BP (!) 150/70   Pulse 62   Ht 5' 2.5" (1.588 m)   Wt 165 lb 6.4 oz (75 kg)   BMI 29.77 kg/m   Physical Exam:  Well appearing 82 yo woman, NAD HEENT: Unremarkable Neck:  6 cm JVD, no thyromegally Lymphatics:  No adenopathy Back:  No CVA tenderness Lungs:  Clear with no wheezes HEART:  Regular rate rhythm, no murmurs, no rubs, no clicks Abd:  soft, positive bowel sounds, no organomegally, no rebound, no guarding Ext:  2 plus pulses, no edema, no cyanosis, no clubbing Skin:  No rashes no nodules Neuro:  CN II through XII intact, motor grossly intact  EKG - none  DEVICE  Normal device function.  See PaceArt for details.   Assess/Plan: 1. Sinus node dysfunction - she is s/p PPM insertion and is asymptomatic. 2. PPM - her [employer] device is working normally.  3. Jaw pain - I think that this could be angina. She cannot walk sufficiently to undergo a meaningful stress test. I have recommended she undergo a lexiscan myoview. 4. HTN - her blood pressure is elevated today. I  have asked her to take her meds when she gets home and avoid salty foods.   Mikle Bosworth.D.

## 2017-07-23 NOTE — Patient Instructions (Addendum)
Medication Instructions:  Your physician recommends that you continue on your current medications as directed. Please refer to the Current Medication list given to you today.  Labwork: None ordered.  Testing/Procedures: Your physician has requested that you have a lexiscan myoview. For further information please visit [email or web address]. Please follow instruction sheet, as given.  Please schedule for a lexiscan myoview.  Follow-Up: Your physician wants you to follow-up in: depending on results of your stress test.  If stress test is ok you will follow up with Dr. Lovena Le in one year.  Remote monitoring is used to monitor your Pacemaker from home. This monitoring reduces the number of office visits required to check your device to one time per year. It allows [place] to keep an eye on the functioning of your device to ensure it is working properly. You are scheduled for a device check from home on 08/04/2017. You may send your transmission at any time that day. If you have a wireless device, the transmission will be sent automatically. After your physician reviews your transmission, you will receive a postcard with your next transmission date.  Any Other Special Instructions Will Be Listed Below (If Applicable).  If you need a refill on your cardiac medications before your next appointment, please call your pharmacy.  Cardiac Nuclear Scan A cardiac nuclear scan is a test that measures blood flow to the heart when a person is resting and when he or she is exercising. The test looks for problems such as:  Not enough blood reaching a portion of the heart.  The heart muscle not working normally.  You may need this test if:  You have heart disease.  You have had abnormal lab results.  You have had heart surgery or angioplasty.  You have chest pain.  You have shortness of breath.  In this test, a radioactive dye (tracer) is injected into your bloodstream. After the tracer has traveled to  your heart, an imaging device is used to measure how much of the tracer is absorbed by or distributed to various areas of your heart. This procedure is usually done at a hospital and takes 2-4 hours. Tell a health care provider about:  Any allergies you have.  All medicines you are taking, including vitamins, herbs, eye drops, creams, and over-the-counter medicines.  Any problems you or family members have had with the use of anesthetic medicines.  Any blood disorders you have.  Any surgeries you have had.  Any medical conditions you have.  Whether you are pregnant or may be pregnant. What are the risks? Generally, this is a safe procedure. However, problems may occur, including:  Serious chest pain and heart attack. This is only a risk if the stress portion of the test is done.  Rapid heartbeat.  Sensation of warmth in your chest. This usually passes quickly.  What happens before the procedure?  Ask your health care provider about changing or stopping your regular medicines. This is especially important if you are taking diabetes medicines or blood thinners.  Remove your jewelry on the day of the procedure. What happens during the procedure?  An IV tube will be inserted into one of your veins.  Your health care provider will inject a small amount of radioactive tracer through the tube.  You will wait for 20-40 minutes while the tracer travels through your bloodstream.  Your heart activity will be monitored with an electrocardiogram (ECG).  You will lie down on an exam table.  Images of your heart  will be taken for about 15-20 minutes.  You may be asked to exercise on a treadmill or stationary bike. While you exercise, your heart's activity will be monitored with an ECG, and your blood pressure will be checked. If you are unable to exercise, you may be given a medicine to increase blood flow to parts of your heart.  When blood flow to your heart has peaked, a tracer will  again be injected through the IV tube.  After 20-40 minutes, you will get back on the exam table and have more images taken of your heart.  When the procedure is over, your IV tube will be removed. The procedure may vary among health care providers and hospitals. Depending on the type of tracer used, scans may need to be repeated 3-4 hours later. What happens after the procedure?  Unless your health care provider tells you otherwise, you may return to your normal schedule, including diet, activities, and medicines.  Unless your health care provider tells you otherwise, you may increase your fluid intake. This will help flush the contrast dye from your body. Drink enough fluid to keep your urine clear or pale yellow.  It is up to you to get your test results. Ask your health care provider, or the department that is doing the test, when your results will be ready. Summary  A cardiac nuclear scan measures the blood flow to the heart when a person is resting and when he or she is exercising.  You may need this test if you are at risk for heart disease.  Tell your health care provider if you are pregnant.  Unless your health care provider tells you otherwise, increase your fluid intake. This will help flush the contrast dye from your body. Drink enough fluid to keep your urine clear or pale yellow. This information is not intended to replace advice given to you by your health care provider. Make sure you discuss any questions you have with your health care provider. Document Released: 08/08/2004 Document Revised: 07/16/2016 Document Reviewed: 06/22/2013 Elsevier Interactive Patient Education  2017 [employer].

## 2017-07-24 DIAGNOSIS — M542 Cervicalgia: Secondary | ICD-10-CM | POA: Diagnosis not present

## 2017-07-24 DIAGNOSIS — M546 Pain in thoracic spine: Secondary | ICD-10-CM | POA: Diagnosis not present

## 2017-07-27 ENCOUNTER — Telehealth (HOSPITAL_COMMUNITY): Payer: Self-pay | Admitting: *Deleted

## 2017-07-27 NOTE — Telephone Encounter (Signed)
Patient given detailed instructions per Myocardial Perfusion Study Information Sheet for the test on 07/30/17 at 0945. Patient notified to arrive 15 minutes early and that it is imperative to arrive on time for appointment to keep from having the test rescheduled.  If you need to cancel or reschedule your appointment, please call the office within 24 hours of your appointment. . Patient verbalized understanding.[name], Ranae Palms

## 2017-07-29 DIAGNOSIS — M546 Pain in thoracic spine: Secondary | ICD-10-CM | POA: Diagnosis not present

## 2017-07-29 DIAGNOSIS — M542 Cervicalgia: Secondary | ICD-10-CM | POA: Diagnosis not present

## 2017-07-30 ENCOUNTER — Ambulatory Visit (HOSPITAL_COMMUNITY): Payer: Medicare Other | Attending: Cardiology

## 2017-07-30 DIAGNOSIS — I200 Unstable angina: Secondary | ICD-10-CM | POA: Diagnosis not present

## 2017-07-30 DIAGNOSIS — R6884 Jaw pain: Secondary | ICD-10-CM | POA: Diagnosis not present

## 2017-07-30 LAB — MYOCARDIAL PERFUSION IMAGING
CHL CUP NUCLEAR SDS: 6
CHL CUP RESTING HR STRESS: 65 {beats}/min
LHR: 0.36
LV dias vol: 66 mL (ref 46–106)
LVSYSVOL: 18 mL
Peak HR: 88 {beats}/min
SRS: 8
SSS: 14
TID: 1.07

## 2017-07-30 MED ORDER — TECHNETIUM TC 99M TETROFOSMIN IV KIT
31.2000 | PACK | Freq: Once | INTRAVENOUS | Status: AC | PRN
  Administered 2017-07-30: 31.2 via INTRAVENOUS
  Filled 2017-07-30: qty 32

## 2017-07-30 MED ORDER — REGADENOSON 0.4 MG/5ML IV SOLN
0.4000 mg | Freq: Once | INTRAVENOUS | Status: AC
  Administered 2017-07-30: 0.4 mg via INTRAVENOUS

## 2017-07-30 MED ORDER — TECHNETIUM TC 99M TETROFOSMIN IV KIT
10.6000 | PACK | Freq: Once | INTRAVENOUS | Status: AC | PRN
  Administered 2017-07-30: 10.6 via INTRAVENOUS
  Filled 2017-07-30: qty 11

## 2017-07-31 DIAGNOSIS — M546 Pain in thoracic spine: Secondary | ICD-10-CM | POA: Diagnosis not present

## 2017-07-31 DIAGNOSIS — M542 Cervicalgia: Secondary | ICD-10-CM | POA: Diagnosis not present

## 2017-08-03 ENCOUNTER — Telehealth: Payer: Self-pay | Admitting: Internal Medicine

## 2017-08-03 NOTE — Telephone Encounter (Signed)
New Message     When will you get the heart cath results back?

## 2017-08-04 ENCOUNTER — Ambulatory Visit (INDEPENDENT_AMBULATORY_CARE_PROVIDER_SITE_OTHER): Payer: Medicare Other | Admitting: *Deleted

## 2017-08-04 DIAGNOSIS — M546 Pain in thoracic spine: Secondary | ICD-10-CM | POA: Diagnosis not present

## 2017-08-04 DIAGNOSIS — I495 Sick sinus syndrome: Secondary | ICD-10-CM

## 2017-08-04 DIAGNOSIS — M542 Cervicalgia: Secondary | ICD-10-CM | POA: Diagnosis not present

## 2017-08-04 NOTE — Progress Notes (Signed)
Remote pacemaker transmission.   

## 2017-08-05 ENCOUNTER — Encounter: Payer: Self-pay | Admitting: Cardiology

## 2017-08-05 NOTE — Telephone Encounter (Signed)
Follow up    Patient is calling to get test results from last week. Still has not heard from the office

## 2017-08-05 NOTE — Telephone Encounter (Signed)
Call returned to Pt.  Pt notified of test results.  All questions answered.  Pt will follow up in one year.  Notified Pt to call if any further issues.

## 2017-08-06 LAB — CUP PACEART REMOTE DEVICE CHECK
Date Time Interrogation Session: 20190110131021
Implantable Lead Implant Date: 20171009
Implantable Lead Implant Date: 20171009
Implantable Lead Location: 753860
Implantable Lead Model: 7741
Implantable Lead Serial Number: 785430
Implantable Pulse Generator Implant Date: 20171009
Lead Channel Setting Pacing Amplitude: 2 V
Lead Channel Setting Pacing Pulse Width: 0.4 ms
Lead Channel Setting Sensing Sensitivity: 2.5 mV
MDC IDC LEAD LOCATION: 753859
MDC IDC SET LEADCHNL RV PACING AMPLITUDE: 2.5 V
Pulse Gen Serial Number: 766467

## 2017-08-07 DIAGNOSIS — M542 Cervicalgia: Secondary | ICD-10-CM | POA: Diagnosis not present

## 2017-08-07 DIAGNOSIS — M546 Pain in thoracic spine: Secondary | ICD-10-CM | POA: Diagnosis not present

## 2017-08-14 DIAGNOSIS — M542 Cervicalgia: Secondary | ICD-10-CM | POA: Diagnosis not present

## 2017-08-14 DIAGNOSIS — M546 Pain in thoracic spine: Secondary | ICD-10-CM | POA: Diagnosis not present

## 2017-08-18 ENCOUNTER — Encounter: Payer: Self-pay | Admitting: *Deleted

## 2017-08-18 ENCOUNTER — Ambulatory Visit (INDEPENDENT_AMBULATORY_CARE_PROVIDER_SITE_OTHER): Payer: Medicare Other | Admitting: *Deleted

## 2017-08-18 DIAGNOSIS — M810 Age-related osteoporosis without current pathological fracture: Secondary | ICD-10-CM | POA: Diagnosis not present

## 2017-08-18 MED ORDER — DENOSUMAB 60 MG/ML ~~LOC~~ SOLN
60.0000 mg | Freq: Once | SUBCUTANEOUS | Status: AC
  Administered 2017-08-18: 60 mg via SUBCUTANEOUS

## 2017-08-21 DIAGNOSIS — M546 Pain in thoracic spine: Secondary | ICD-10-CM | POA: Diagnosis not present

## 2017-08-21 DIAGNOSIS — M542 Cervicalgia: Secondary | ICD-10-CM | POA: Diagnosis not present

## 2017-08-28 LAB — CUP PACEART INCLINIC DEVICE CHECK
Implantable Lead Implant Date: 20171009
Implantable Lead Location: 753860
Implantable Lead Model: 7741
Implantable Lead Serial Number: 785430
Lead Channel Pacing Threshold Amplitude: 0.4 V
Lead Channel Pacing Threshold Amplitude: 0.8 V
Lead Channel Pacing Threshold Pulse Width: 0.4 ms
Lead Channel Sensing Intrinsic Amplitude: 8.3 mV
Lead Channel Setting Pacing Amplitude: 2 V
Lead Channel Setting Pacing Amplitude: 2.5 V
Lead Channel Setting Pacing Pulse Width: 0.4 ms
Lead Channel Setting Sensing Sensitivity: 2.5 mV
MDC IDC LEAD IMPLANT DT: 20171009
MDC IDC LEAD LOCATION: 753859
MDC IDC MSMT LEADCHNL RA IMPEDANCE VALUE: 386 Ohm
MDC IDC MSMT LEADCHNL RA PACING THRESHOLD PULSEWIDTH: 1 ms
MDC IDC MSMT LEADCHNL RA SENSING INTR AMPL: 2.4 mV
MDC IDC MSMT LEADCHNL RV IMPEDANCE VALUE: 647 Ohm
MDC IDC PG IMPLANT DT: 20171009
MDC IDC PG SERIAL: 766467
MDC IDC SESS DTM: 20181227050000

## 2017-09-01 ENCOUNTER — Ambulatory Visit (INDEPENDENT_AMBULATORY_CARE_PROVIDER_SITE_OTHER): Payer: Medicare Other | Admitting: Gastroenterology

## 2017-09-01 ENCOUNTER — Encounter (INDEPENDENT_AMBULATORY_CARE_PROVIDER_SITE_OTHER): Payer: Self-pay

## 2017-09-01 ENCOUNTER — Encounter: Payer: Self-pay | Admitting: Gastroenterology

## 2017-09-01 VITALS — BP 134/56 | HR 81 | Ht 61.25 in | Wt 166.6 lb

## 2017-09-01 DIAGNOSIS — I200 Unstable angina: Secondary | ICD-10-CM

## 2017-09-01 DIAGNOSIS — K5904 Chronic idiopathic constipation: Secondary | ICD-10-CM | POA: Diagnosis not present

## 2017-09-01 DIAGNOSIS — K219 Gastro-esophageal reflux disease without esophagitis: Secondary | ICD-10-CM

## 2017-09-01 DIAGNOSIS — R079 Chest pain, unspecified: Secondary | ICD-10-CM

## 2017-09-01 MED ORDER — ESOMEPRAZOLE MAGNESIUM 40 MG PO CPDR: DELAYED_RELEASE_CAPSULE | ORAL | 11 refills | Status: DC

## 2017-09-01 NOTE — Patient Instructions (Signed)
We have sent the following medications to your pharmacy for you to pick up at your convenience: Nexium.   Follow up with Dr. Quay Burow for your chest wall pain.  Thank you for choosing me and [facility].  Pricilla Riffle. Dagoberto Ligas., MD., Marval Regal

## 2017-09-01 NOTE — Progress Notes (Signed)
    History of Present Illness: This is an 83 year old female with a history of GERD which is well controlled.  She complains of difficulty swallowing pills which is unchanged chronic. She has constipation which is unchanged and chronic. She notes lower chest and upper abdominal pain that worsens with movement or bending. Does not appear to be related to meals or bowel movements.   Current Medications, Allergies, Past Medical History, Past Surgical History, Family History and Social History were reviewed in [employer] record.  Physical Exam: General: Well developed, well nourished, no acute distress Head: Normocephalic and atraumatic Eyes:  sclerae anicteric, EOMI Ears: Normal auditory acuity Mouth: No deformity or lesions Lungs: Clear throughout to auscultation Chest: significant tenderness over lower chest wall bilaterally Heart: Regular rate and rhythm; no murmurs, rubs or bruits Abdomen: Soft, mild tenderness along abdominal just below the costochondral margin and non distended. No masses, hepatosplenomegaly or hernias noted. Normal Bowel sounds Rectal: not done Musculoskeletal: Symmetrical with no gross deformities  Pulses:  Normal pulses noted Extremities: No clubbing, cyanosis, edema or deformities noted Neurological: Alert oriented x 4, grossly nonfocal, cooperative. Normal mood and affect  Assessment and Recommendations:  1. Significant chest wall pain and mild upper abdominal tenderness which seems to be abdominal wall pain. Begin ibuprofen 400-600 mg tid for about 2 weeks if ok's by her PCP. Further evaluation and follow up for this problem with her PCP. If abdominal pain does not improve consider further GI evaluation.   2. Dysphagia, chronic, unchanged primarily with large pills. Barium esophagram with tablet in 02/2016 was unremarkable.  Prescribing providers to adjust medications to smaller pills, liquids as needed.   3. GERD. Continue esomeprazole 40  mg po bid and antireflux measures.   4. Constipation. Continue Miralax 1-3 times daily as needed.

## 2017-09-02 ENCOUNTER — Encounter: Payer: Self-pay | Admitting: Gastroenterology

## 2017-09-07 ENCOUNTER — Telehealth: Payer: Self-pay | Admitting: Gastroenterology

## 2017-09-07 ENCOUNTER — Other Ambulatory Visit: Payer: Self-pay | Admitting: Internal Medicine

## 2017-09-07 MED ORDER — OSELTAMIVIR PHOSPHATE 75 MG PO CAPS: 75.0000 mg | ORAL_CAPSULE | Freq: Every day | ORAL | 0 refills | Status: DC

## 2017-09-07 NOTE — Progress Notes (Signed)
amiflu  

## 2017-09-08 NOTE — Telephone Encounter (Signed)
Initiated PA on cover my meds. Waiting on a response. 

## 2017-09-08 NOTE — Progress Notes (Signed)
Subjective:    Patient ID: [name], female    DOB: [birth year], 83 y.o.   MRN: [id number]  HPI The patient is here for follow up.  Upper abdominal discomfort:  She did see GI recently.  She was complaining of difficulty swallowing pills which is unchanged and chronic.  She has GERD which is well controlled.  She has chronic constipation that is unchanged.  She has had lower chest and upper abdominal pain that worsens with movement or bending.  The pain is not related to meals.  Dr Fuller Plan thought her pain was abdominal wall tenderness.  He advised ibuprofen 400-600 mg TID for 2 weeks.  He advised further evaluation with me and consider GI evaluation if no better.   She has an intermittent pulling sensation in her upper abdomen.  She can feel it when she walks, bends or is riding in the car. It can sometimes hurt in her right mid back.  Sometimes the discomfort radiates around central abdomen to the back.  It makes her not feel good.  It is not pain.  Sitting can give her relief.  She has a fat roll in her upper abdomen that she feels just came up.    She feels the discomfort most days, but not every day.    It is not a burning pain.  It is an uncomfortable pain/discomfort.  It often lasts 15 minutes or so.  It is related to movements.    She is currently not exercising for the past two weeks.  Her husband has had multiple medical problems and is has been consuming her time.  There is no relation to her symptoms with eating or swallowing.  Medications and allergies reviewed with patient and updated if appropriate.  Patient Active Problem List   Diagnosis Date Noted  . Epigastric pain 09/09/2017  . Neuralgia 09/09/2017  . Claudication of calf muscles ([place]) 11/22/2016  . Dyspnea 11/22/2016  . Diastolic dysfunction 50/93/2671  . Hyperreflexia of lower extremity 07/16/2016  . Bilateral leg paresthesia 07/16/2016  . Carotid arterial disease ([place]) 07/13/2016  . Undiagnosed cardiac  murmurs 06/23/2016  . Poor balance 06/23/2016  . Weakness of both lower extremities 06/23/2016  . Sinus node dysfunction ([place]) 05/05/2016  . Right shoulder pain 03/14/2016  . Multinodular goiter (nontoxic) 02/21/2016  . Neck pain 02/15/2016  . Pain in limb 02/15/2016  . Neck nodule 02/15/2016  . Prediabetes 12/17/2015  . Dysphagia 12/17/2015  . Anxiety 11/13/2015  . T8 vertebral fracture ([place]) 04/09/2015  . Overweight 10/09/2011  . Allergic rhinitis, cause unspecified   . GERD (gastroesophageal reflux disease) 02/05/2011  . EPISTAXIS 07/23/2010  . FATIGUE 09/28/2009  . Vitamin B 12 deficiency 04/17/2009  . Hyperlipidemia 04/17/2009  . Osteoporosis 04/17/2009  . ADENOCARCINOMA, BREAST, HX OF 04/17/2009  . CONSTIPATION 03/08/2009  . COLONIC POLYPS, HYPERPLASTIC, HX OF 03/06/2009    Current Outpatient Medications on File Prior to Visit  Medication Sig Dispense Refill  . acetaminophen (TYLENOL) 500 MG tablet Take 500 mg by mouth 2 (two) times daily.    [name] aspirin 81 MG tablet Take 81 mg by mouth daily.    . Calcium Carbonate-Vit D-Min (CALTRATE 600+D PLUS MINERALS) 600-800 MG-UNIT CHEW Chew 3 tablets by mouth 1 day or 1 dose.     . cholecalciferol (VITAMIN D) 1000 UNITS tablet Take 2,000 Units by mouth daily.     . Cyanocobalamin (RA VITAMIN B-12 TR) 1000 MCG TBCR Take 1 tablet by mouth every other  day.     . esomeprazole (NEXIUM) 40 MG capsule TAKE 1 CAPSULE TWICE DAILY BEFORE MEALS 60 capsule 11  . fluticasone (FLONASE) 50 MCG/ACT nasal spray Place 1 spray into both nostrils daily as needed for allergies.     [name] loratadine (CLARITIN) 10 MG tablet Take 10 mg by mouth daily as needed for allergies.    [name] oseltamivir (TAMIFLU) 75 MG capsule Take 1 capsule (75 mg total) by mouth daily. 10 capsule 0  . polyethylene glycol (MIRALAX / GLYCOLAX) packet Take 17 g by mouth daily as needed (constipation).     . potassium chloride SA (K-DUR,KLOR-CON) 20 MEQ tablet Take 1 tablet (20 mEq total) by  mouth daily. 90 tablet 3  . ranitidine (ZANTAC) 150 MG tablet Take 1 tablet (150 mg total) by mouth at bedtime as needed for heartburn. 90 tablet 3  . furosemide (LASIX) 40 MG tablet Take 1 tablet (40 mg total) by mouth daily. 90 tablet 3   No current facility-administered medications on file prior to visit.     Past Medical History:  Diagnosis Date  . Cancer of left breast ([place]) 1978  . Chronic thoracic back pain    "T8; fracture; 03/2015; no OR" (05/05/2016)  . GERD (gastroesophageal reflux disease)   . Heart murmur   . History of hiatal hernia   . Hyperlipidemia   . Hyperplastic colon polyp   . IBS (irritable bowel syndrome)   . Multiple thyroid nodules   . Osteoporosis    T8 compression fx 03/2015   . Personal history of radiation therapy   . Presence of permanent cardiac pacemaker   . Vitamin B12 deficiency     Past Surgical History:  Procedure Laterality Date  . ANTERIOR CERVICAL DECOMP/DISCECTOMY FUSION  2008   C5-6  . AUGMENTATION MAMMAPLASTY    . BACK SURGERY    . BREAST SURGERY    . CARDIAC CATHETERIZATION  2001  . EP IMPLANTABLE DEVICE N/A 05/05/2016   Procedure: Pacemaker Implant;  Surgeon: Evans Lance, MD;  Location: [facility];  Service: Cardiovascular;  Laterality: N/A;  . EXCISIONAL HEMORRHOIDECTOMY  1984   With subsequent correction of surgery  . INSERT / REPLACE / REMOVE PACEMAKER    . KNEE ARTHROSCOPY Left    "meniscus tear"  . MASTECTOMY Left 1978  . PLACEMENT OF BREAST IMPLANTS Left 1981  . SHOULDER ARTHROSCOPY W/ ROTATOR CUFF REPAIR Left 02/2006   "tear"  . TUBAL LIGATION      Social History   Socioeconomic History  . Marital status: Married    Spouse name: None  . Number of children: None  . Years of education: None  . Highest education level: None  Social Needs  . Financial resource strain: None  . Food insecurity - worry: None  . Food insecurity - inability: None  . Transportation needs - medical: None  . Transportation  needs - non-medical: None  Occupational History  . None  Tobacco Use  . Smoking status: Never Smoker  . Smokeless tobacco: Never Used  Substance and Sexual Activity  . Alcohol use: No    Alcohol/week: 0.0 oz  . Drug use: No  . Sexual activity: Not Currently  Other Topics Concern  . None  Social History Narrative   Housewife, Lives with spouse, 2 children    Family History  Problem Relation Age of Onset  . Diabetes Sister   . Diabetes Brother   . Breast cancer Maternal Aunt   . Colon cancer  Neg Hx     Review of Systems  Constitutional: Negative for chills and fever.  HENT: Positive for trouble swallowing.   Respiratory: Negative for shortness of breath.   Cardiovascular: Negative for chest pain and palpitations.  Gastrointestinal: Positive for abdominal pain, constipation (Chronic) and nausea. Negative for diarrhea.  Neurological: Positive for numbness (numbness/tingling left arm - chronic, unchanged).       Objective:   Vitals:   09/09/17 1346  BP: (!) 120/58  Pulse: 83  Resp: 16  Temp: 97.9 F (36.6 C)  SpO2: 96%   Wt Readings from Last 3 Encounters:  09/09/17 167 lb (75.8 kg)  09/01/17 166 lb 9.6 oz (75.6 kg)  07/30/17 164 lb (74.4 kg)   Body mass index is 31.3 kg/m.   Physical Exam    Constitutional: Appears well-developed and well-nourished. No distress.  HENT:  Head: Normocephalic and atraumatic.  Neck: Neck supple. No tracheal deviation present. No thyromegaly present.  No cervical lymphadenopathy Cardiovascular: Normal rate, regular rhythm and normal heart sounds.   No murmur heard. No carotid bruit .  No edema Pulmonary/Chest: Effort normal and breath sounds normal. No respiratory distress. No has no wheezes. No rales.  Abdomen: Mild discomfort epigastrium and right upper quadrant without rebound or guarding, no mass Musculoskeletal: No right middle back tenderness Skin: Skin is warm and dry. Not diaphoretic. No rash Psychiatric: Normal mood  and affect. Behavior is normal.      Assessment & Plan:    See Problem List for Assessment and Plan of chronic medical problems.

## 2017-09-09 ENCOUNTER — Encounter: Payer: Self-pay | Admitting: Internal Medicine

## 2017-09-09 ENCOUNTER — Ambulatory Visit (INDEPENDENT_AMBULATORY_CARE_PROVIDER_SITE_OTHER): Payer: Medicare Other | Admitting: Internal Medicine

## 2017-09-09 VITALS — BP 120/58 | HR 83 | Temp 97.9°F | Resp 16 | Wt 167.0 lb

## 2017-09-09 DIAGNOSIS — R1013 Epigastric pain: Secondary | ICD-10-CM | POA: Diagnosis not present

## 2017-09-09 DIAGNOSIS — M792 Neuralgia and neuritis, unspecified: Secondary | ICD-10-CM | POA: Diagnosis not present

## 2017-09-09 DIAGNOSIS — I200 Unstable angina: Secondary | ICD-10-CM

## 2017-09-09 MED ORDER — AMITRIPTYLINE HCL 10 MG PO TABS: 10.0000 mg | ORAL_TABLET | Freq: Every day | ORAL | 3 refills | Status: DC

## 2017-09-09 NOTE — Assessment & Plan Note (Addendum)
Intermittent right upper quadrant and epigastric discomfort more than pain No relation to eating and related more to bending Gallbladder disease less likely a hiatal hernia possible Likely muscular skeletal in general, but has not had an abdominal ultrasound and I will order that

## 2017-09-09 NOTE — Telephone Encounter (Signed)
Received fax from [place] stating the PA for esomeprazole was approved starting 06/10/17-09/08/2018. Attempted to contact patient and patient did not answer and her voicemail has not been set up.

## 2017-09-09 NOTE — Patient Instructions (Addendum)
Try taking the amitriptyline at bedtime.  If you tolerate this we can increase the dose further.  If you have side effects please let me know.    An abdominal ultrasound was ordered and someone will all you to schedule this.      Follow up in about 3 weeks.

## 2017-09-09 NOTE — Assessment & Plan Note (Addendum)
Upper abdominal pain often radiates to back and she does have chronic back disease and pain Possible neuralgia We will try amitriptyline 10 mg at bedtime.  She has not tolerated Lyrica, gabapentin or Cymbalta in the past She did do some physical therapy for her back-can consider additional physical therapy Can consider sports medicine/orthopedic evaluation if there is no improvement Follow-up in 3 weeks or so

## 2017-09-16 ENCOUNTER — Telehealth: Payer: Self-pay | Admitting: Gastroenterology

## 2017-09-16 MED ORDER — OMEPRAZOLE 40 MG PO CPDR: 40.0000 mg | DELAYED_RELEASE_CAPSULE | Freq: Two times a day (BID) | ORAL | 11 refills | Status: DC

## 2017-09-16 NOTE — Telephone Encounter (Signed)
Patient states her pharmacy told her the esomeprazole is too expensive and her [facility] told her that the omeprazole is cheaper. Patient would like new rx for omeprazole sent to the pharmacy. Informed patient that I will send in new rx.

## 2017-09-22 ENCOUNTER — Ambulatory Visit
Admission: RE | Admit: 2017-09-22 | Discharge: 2017-09-22 | Disposition: A | Discharge status: Home / Self Care | Payer: Medicare Other | Source: Ambulatory Visit | Attending: Internal Medicine | Admitting: Internal Medicine

## 2017-09-22 DIAGNOSIS — R1084 Generalized abdominal pain: Secondary | ICD-10-CM | POA: Diagnosis not present

## 2017-09-22 DIAGNOSIS — R1013 Epigastric pain: Secondary | ICD-10-CM

## 2017-09-30 ENCOUNTER — Ambulatory Visit (INDEPENDENT_AMBULATORY_CARE_PROVIDER_SITE_OTHER): Payer: Medicare Other | Admitting: Internal Medicine

## 2017-09-30 ENCOUNTER — Encounter: Payer: Self-pay | Admitting: Internal Medicine

## 2017-09-30 VITALS — BP 114/64 | HR 63 | Temp 97.8°F | Resp 16 | Wt 167.0 lb

## 2017-09-30 DIAGNOSIS — I200 Unstable angina: Secondary | ICD-10-CM | POA: Diagnosis not present

## 2017-09-30 DIAGNOSIS — R0609 Other forms of dyspnea: Secondary | ICD-10-CM | POA: Diagnosis not present

## 2017-09-30 DIAGNOSIS — R0600 Dyspnea, unspecified: Secondary | ICD-10-CM

## 2017-09-30 DIAGNOSIS — R1013 Epigastric pain: Secondary | ICD-10-CM

## 2017-09-30 MED ORDER — AMITRIPTYLINE HCL 10 MG PO TABS: 20.0000 mg | ORAL_TABLET | Freq: Every day | ORAL | 3 refills | Status: DC

## 2017-09-30 NOTE — Assessment & Plan Note (Signed)
Sob with exertion Following with cardiology - recent stress test done Never smoker, no other pulmonary symptoms Likely related to deconditioning Increase activity - continue regular exercise If SOB persists can refer to pulmonary

## 2017-09-30 NOTE — Progress Notes (Signed)
moderate   Subjective:    Patient ID: [name], female    DOB: 10-21-1934, 83 y.o.   MRN: [id number]  HPI The patient is here for follow up.  Lower chest, upper abdominal pain worse with movement or bending: she has chronic back pain and this pain is likely neuralgia.  We started amitriptyline 10 mg at bedtime 3 weeks ago.  She denies side effects.  She denies morning grogginess.  She think she symptoms are better.  She still has some discomfort in her epigastric region.    SOB:  She feels SOB with moderate exertion or after exertion.  It is better with rest.  She did not exercise for while and just restarted exercising.  She never smoked. She denies cough and wheeze.  She follows with cardiology.  She just had a stress test done.   Medications and allergies reviewed with patient and updated if appropriate.  Patient Active Problem List   Diagnosis Date Noted  . Epigastric pain 09/09/2017  . Neuralgia 09/09/2017  . Claudication of calf muscles ([place]) 11/22/2016  . Dyspnea 11/22/2016  . Diastolic dysfunction 58/03/9832  . Hyperreflexia of lower extremity 07/16/2016  . Bilateral leg paresthesia 07/16/2016  . Carotid arterial disease ([place]) 07/13/2016  . Undiagnosed cardiac murmurs 06/23/2016  . Poor balance 06/23/2016  . Weakness of both lower extremities 06/23/2016  . Sinus node dysfunction ([place]) 05/05/2016  . Right shoulder pain 03/14/2016  . Multinodular goiter (nontoxic) 02/21/2016  . Neck pain 02/15/2016  . Pain in limb 02/15/2016  . Neck nodule 02/15/2016  . Prediabetes 12/17/2015  . Dysphagia 12/17/2015  . Anxiety 11/13/2015  . T8 vertebral fracture ([place]) 04/09/2015  . Overweight 10/09/2011  . Allergic rhinitis, cause unspecified   . GERD (gastroesophageal reflux disease) 02/05/2011  . EPISTAXIS 07/23/2010  . FATIGUE 09/28/2009  . Vitamin B 12 deficiency 04/17/2009  . Hyperlipidemia 04/17/2009  . Osteoporosis 04/17/2009  . ADENOCARCINOMA, BREAST, HX OF  04/17/2009  . CONSTIPATION 03/08/2009  . COLONIC POLYPS, HYPERPLASTIC, HX OF 03/06/2009    Current Outpatient Medications on File Prior to Visit  Medication Sig Dispense Refill  . acetaminophen (TYLENOL) 500 MG tablet Take 500 mg by mouth 2 (two) times daily.    [name] aspirin 81 MG tablet Take 81 mg by mouth daily.    . Calcium Carbonate-Vit D-Min (CALTRATE 600+D PLUS MINERALS) 600-800 MG-UNIT CHEW Chew 3 tablets by mouth 1 day or 1 dose.     . cholecalciferol (VITAMIN D) 1000 UNITS tablet Take 2,000 Units by mouth daily.     . Cyanocobalamin (RA VITAMIN B-12 TR) 1000 MCG TBCR Take 1 tablet by mouth every other day.     . fluticasone (FLONASE) 50 MCG/ACT nasal spray Place 1 spray into both nostrils daily as needed for allergies.     [name] loratadine (CLARITIN) 10 MG tablet Take 10 mg by mouth daily as needed for allergies.    [name] omeprazole (PRILOSEC) 40 MG capsule Take 1 capsule (40 mg total) by mouth 2 (two) times daily. 60 capsule 11  . polyethylene glycol (MIRALAX / GLYCOLAX) packet Take 17 g by mouth daily as needed (constipation).     . potassium chloride SA (K-DUR,KLOR-CON) 20 MEQ tablet Take 1 tablet (20 mEq total) by mouth daily. 90 tablet 3  . ranitidine (ZANTAC) 150 MG tablet Take 1 tablet (150 mg total) by mouth at bedtime as needed for heartburn. 90 tablet 3  . furosemide (LASIX) 40 MG tablet Take 1 tablet (40 mg total)  by mouth daily. 90 tablet 3   No current facility-administered medications on file prior to visit.     Past Medical History:  Diagnosis Date  . Cancer of left breast ([place]) 1978  . Chronic thoracic back pain    "T8; fracture; 03/2015; no OR" (05/05/2016)  . GERD (gastroesophageal reflux disease)   . Heart murmur   . History of hiatal hernia   . Hyperlipidemia   . Hyperplastic colon polyp   . IBS (irritable bowel syndrome)   . Multiple thyroid nodules   . Osteoporosis    T8 compression fx 03/2015   . Personal history of radiation therapy   . Presence of permanent  cardiac pacemaker   . Vitamin B12 deficiency     Past Surgical History:  Procedure Laterality Date  . ANTERIOR CERVICAL DECOMP/DISCECTOMY FUSION  2008   C5-6  . AUGMENTATION MAMMAPLASTY    . BACK SURGERY    . BREAST SURGERY    . CARDIAC CATHETERIZATION  2001  . EP IMPLANTABLE DEVICE N/A 05/05/2016   Procedure: Pacemaker Implant;  Surgeon: Evans Lance, MD;  Location: [facility];  Service: Cardiovascular;  Laterality: N/A;  . EXCISIONAL HEMORRHOIDECTOMY  1984   With subsequent correction of surgery  . INSERT / REPLACE / REMOVE PACEMAKER    . KNEE ARTHROSCOPY Left    "meniscus tear"  . MASTECTOMY Left 1978  . PLACEMENT OF BREAST IMPLANTS Left 1981  . SHOULDER ARTHROSCOPY W/ ROTATOR CUFF REPAIR Left 02/2006   "tear"  . TUBAL LIGATION      Social History   Socioeconomic History  . Marital status: Married    Spouse name: None  . Number of children: None  . Years of education: None  . Highest education level: None  Social Needs  . Financial resource strain: None  . Food insecurity - worry: None  . Food insecurity - inability: None  . Transportation needs - medical: None  . Transportation needs - non-medical: None  Occupational History  . None  Tobacco Use  . Smoking status: Never Smoker  . Smokeless tobacco: Never Used  Substance and Sexual Activity  . Alcohol use: No    Alcohol/week: 0.0 oz  . Drug use: No  . Sexual activity: Not Currently  Other Topics Concern  . None  Social History Narrative   Housewife, Lives with spouse, 2 children    Family History  Problem Relation Age of Onset  . Diabetes Sister   . Diabetes Brother   . Breast cancer Maternal Aunt   . Colon cancer Neg Hx     Review of Systems  Constitutional: Negative for chills and fever.  HENT: Positive for voice change.   Respiratory: Positive for shortness of breath (with exertion). Negative for cough and wheezing.   Cardiovascular: Negative for chest pain, palpitations and leg  swelling.  Endocrine: Positive for cold intolerance.  Neurological: Negative for light-headedness and headaches.       Objective:   Vitals:   09/30/17 1345  BP: 114/64  Pulse: 63  Resp: 16  Temp: 97.8 F (36.6 C)  SpO2: 93%   Wt Readings from Last 3 Encounters:  09/30/17 167 lb (75.8 kg)  09/09/17 167 lb (75.8 kg)  09/01/17 166 lb 9.6 oz (75.6 kg)   Body mass index is 31.3 kg/m.   Physical Exam    Constitutional: Appears well-developed and well-nourished. No distress.  HENT:  Head: Normocephalic and atraumatic.  Neck: Neck supple. No tracheal deviation present. No  thyromegaly present.  No cervical lymphadenopathy Cardiovascular: Normal rate, regular rhythm and normal heart sounds.   No murmur heard. No carotid bruit .  No edema Pulmonary/Chest: Effort normal and breath sounds normal. No respiratory distress. No has no wheezes. No rales.  Abdomen: soft, ND, minimal discomfort in epigastric region with palpation w/o rebound or guarding Skin: Skin is warm and dry. Not diaphoretic.  Psychiatric: Normal mood and affect. Behavior is normal.   US Abdomen Complete CLINICAL DATA:  Intermittent epigastric pain  EXAM: ABDOMEN ULTRASOUND COMPLETE  COMPARISON:  None.  FINDINGS: Gallbladder: Distended gallbladder. Top normal gallbladder wall thickness. No pericholecystic fluid. Negative sonographic Murphy sign. Small amount of gallbladder sludge.  Common bile duct: Diameter: 7.7 mm  Liver: No focal lesion identified. Increased hepatic parenchymal echogenicity. Portal vein is patent on color Doppler imaging with normal direction of blood flow towards the liver.  IVC: No abnormality visualized.  Pancreas: Visualized portion unremarkable.  Spleen: Size and appearance within normal limits.  Right Kidney: Length: 11.2 cm. Echogenicity within normal limits. No mass or hydronephrosis visualized.  Left Kidney: Length: 9.8 cm. Echogenicity within normal limits. No mass or  hydronephrosis visualized.  Abdominal aorta: No aneurysm visualized.  Other findings: None.  IMPRESSION: 1. No cholelithiasis or sonographic evidence of acute cholecystitis. Non-specific distension of the gallbladder which may be secondary to prolonged fast. 2. Increased hepatic echogenicity as can be seen with hepatic steatosis.  Electronically Signed   By: Kathreen Devoid   On: 09/22/2017 16:38    Assessment & Plan:    See Problem List for Assessment and Plan of chronic medical problems.

## 2017-09-30 NOTE — Patient Instructions (Addendum)
    Medications reviewed and updated.  Changes include increasing amitryptyline to 20 mg daily.  Your prescription(s) have been submitted to your pharmacy. Please take as directed and contact our office if you believe you are having problem(s) with the medication(s).   Please followup in 3 months

## 2017-09-30 NOTE — Assessment & Plan Note (Signed)
Epigastric discomfort, no GI related [place] normal, except liver steatosis Some improvement with amitriptyline 10 mg at bedtime -- no side effects -- will increase to 20 mg nightly FU in 3 months

## 2017-11-02 ENCOUNTER — Telehealth: Payer: Self-pay | Admitting: Emergency Medicine

## 2017-11-02 DIAGNOSIS — R0602 Shortness of breath: Secondary | ICD-10-CM

## 2017-11-02 NOTE — Telephone Encounter (Signed)
Copied from [place] [phone]. Topic: Referral - Request >> Nov 02, 2017 11:30 AM [name] wrote: Reason for CRM: Patient is ready to have referral to Pulmonary Dr for SOB. Dr Quay Burow had recommended before. Please advise

## 2017-11-02 NOTE — Telephone Encounter (Signed)
Spoke with pt to inform.  

## 2017-11-02 NOTE — Telephone Encounter (Signed)
Referral ordered

## 2017-11-03 ENCOUNTER — Ambulatory Visit (INDEPENDENT_AMBULATORY_CARE_PROVIDER_SITE_OTHER): Payer: Medicare Other | Admitting: *Deleted

## 2017-11-03 DIAGNOSIS — I495 Sick sinus syndrome: Secondary | ICD-10-CM | POA: Diagnosis not present

## 2017-11-03 NOTE — Progress Notes (Signed)
Remote pacemaker transmission.   

## 2017-11-05 ENCOUNTER — Encounter: Payer: Self-pay | Admitting: Cardiology

## 2017-11-14 IMAGING — CT CT ANGIO HEAD
1 of 15 series · 2 of 33 positions shown · IV contrast (ISOVUE 370)
Comparison: None.

CLINICAL DATA: Vertigo.  Felt a pop behind the right ear

EXAM:
CT ANGIOGRAPHY HEAD AND NECK
TECHNIQUE: Multidetector CT imaging of the head and neck was performed using
the standard protocol during bolus administration of intravenous
contrast. Multiplanar CT image reconstructions and MIPs were
obtained to evaluate the vascular anatomy. Carotid stenosis
measurements (when applicable) are obtained utilizing NASCET
criteria, using the distal internal carotid diameter as the
denominator.
CONTRAST:  100 cc Isovue 370 intravenous

[Series 602: axial thins · axial · 0.66mm/px · z∈[+1414,+1531]mm · 2 of 254 slices shown]
[im 85/254  soft-tissue]
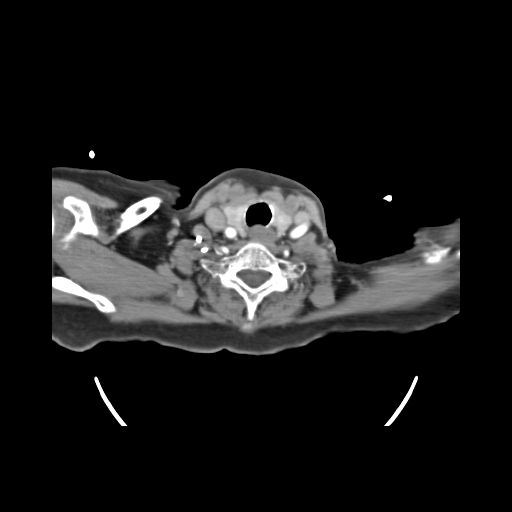
[im 169/254  bone]
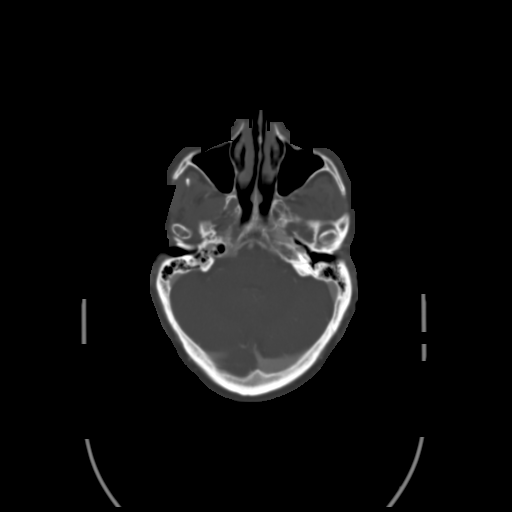

[2 of 33 positions shown; findings below may reference images not displayed]

FINDINGS: CT HEAD FINDINGS

Brain: No evidence of acute infarction, hemorrhage, hydrocephalus,
extra-axial collection or mass lesion/mass effect.

Vascular: Described below

Skull: Negative for fracture

Sinuses: Clear.  No mastoid or middle ear fluid.

Orbits: Negative

Review of the MIP images confirms the above findings

CTA NECK FINDINGS

Aortic arch: Diffuse atherosclerotic calcification. Mild narrowing
of the great vessel origins. Three vessel branching. No acute
finding.

Right carotid system: Limited by motion and streak artifact
proximally. Mild atherosclerotic plaque at the bifurcation. No
stenosis, dissection, or ulceration.

Left carotid system: Prominent calcified plaque at the ostium with
mild narrowing. Mild plaque at the bifurcation. No spur flow
limiting stenosis, dissection, or ulceration.

Vertebral arteries: Advanced atherosclerotic disease in the proximal
left subclavian artery with moderate stenosis before and after the
vertebral artery origin, up to 50%. No flow limiting stenosis in the
right subclavian artery. Mild to moderate right vertebral artery
origin stenosis. No evidence of dissection. Both vessels are smooth
and patent to the dura.

Skeleton: No acute or aggressive finding

Other neck: Small bilateral thyroid nodules measuring less than 1
cm.

Upper chest: Partly visualized pacer and left breast surgery.
Nonspecific patchy atelectasis or other ground-glass density in the
motion degraded upper lungs.

Review of the MIP images confirms the above findings

CTA HEAD FINDINGS

Limited by venous contamination.

Anterior circulation: Symmetric carotid arteries. No major branch
occlusion or flow limiting stenosis. Negative for aneurysm.

Posterior circulation: Fetal type PCA on the left. Early branching
right PICA, extradural. No major branch occlusion or flow limiting
stenosis. Negative for aneurysm.

Venous sinuses: Patent and unremarkable. Right suboccipital veins
have asymmetric delayed enhancement compared to the left, without
detected stenosis or clot. Question if this is related to early
imaging and dominance of the left transverse venous system.

Anatomic variants: Fetal type PCA on the left

Delayed phase: No parenchymal enhancement or mass.

Review of the MIP images confirms the above findings
IMPRESSION: 1. No acute finding.  No evidence of dissection.
2. Advanced atherosclerosis in the proximal left subclavian artery
with up to 50% narrowing.
3. Mild to moderate right vertebral artery ostial stenosis.
4. No carotid or intracranial flow limiting stenosis.

## 2017-11-18 ENCOUNTER — Other Ambulatory Visit: Payer: Self-pay | Admitting: Internal Medicine

## 2017-11-18 DIAGNOSIS — Z1231 Encounter for screening mammogram for malignant neoplasm of breast: Secondary | ICD-10-CM

## 2017-11-19 ENCOUNTER — Other Ambulatory Visit (INDEPENDENT_AMBULATORY_CARE_PROVIDER_SITE_OTHER): Payer: Medicare Other

## 2017-11-19 ENCOUNTER — Encounter: Payer: Self-pay | Admitting: Internal Medicine

## 2017-11-19 ENCOUNTER — Ambulatory Visit (INDEPENDENT_AMBULATORY_CARE_PROVIDER_SITE_OTHER): Payer: Medicare Other | Admitting: Internal Medicine

## 2017-11-19 ENCOUNTER — Ambulatory Visit (INDEPENDENT_AMBULATORY_CARE_PROVIDER_SITE_OTHER)
Admission: RE | Admit: 2017-11-19 | Discharge: 2017-11-19 | Disposition: A | Discharge status: Home / Self Care | Payer: Medicare Other | Source: Ambulatory Visit | Attending: Internal Medicine | Admitting: Internal Medicine

## 2017-11-19 VITALS — BP 120/62 | HR 89 | Ht 61.0 in | Wt 163.0 lb

## 2017-11-19 DIAGNOSIS — R0600 Dyspnea, unspecified: Secondary | ICD-10-CM

## 2017-11-19 DIAGNOSIS — I200 Unstable angina: Secondary | ICD-10-CM

## 2017-11-19 DIAGNOSIS — R0609 Other forms of dyspnea: Secondary | ICD-10-CM | POA: Diagnosis not present

## 2017-11-19 DIAGNOSIS — R05 Cough: Secondary | ICD-10-CM | POA: Diagnosis not present

## 2017-11-19 LAB — CBC WITH DIFFERENTIAL/PLATELET
BASOS PCT: 1 % (ref 0.0–3.0)
Basophils Absolute: 0.1 10*3/uL (ref 0.0–0.1)
EOS ABS: 0.3 10*3/uL (ref 0.0–0.7)
EOS PCT: 4.6 % (ref 0.0–5.0)
HEMATOCRIT: 36.3 % (ref 36.0–46.0)
HEMOGLOBIN: 12.1 g/dL (ref 12.0–15.0)
Lymphocytes Relative: 29.7 % (ref 12.0–46.0)
Lymphs Abs: 2.2 10*3/uL (ref 0.7–4.0)
MCHC: 33.4 g/dL (ref 30.0–36.0)
MCV: 96.3 fl (ref 78.0–100.0)
Monocytes Absolute: 0.8 10*3/uL (ref 0.1–1.0)
Monocytes Relative: 10.4 % (ref 3.0–12.0)
Neutro Abs: 4 10*3/uL (ref 1.4–7.7)
Neutrophils Relative %: 54.3 % (ref 43.0–77.0)
Platelets: 326 10*3/uL (ref 150.0–400.0)
RBC: 3.77 Mil/uL — AB (ref 3.87–5.11)
RDW: 14 % (ref 11.5–15.5)
WBC: 7.4 10*3/uL (ref 4.0–10.5)

## 2017-11-19 LAB — SEDIMENTATION RATE: Sed Rate: 11 mm/hr (ref 0–30)

## 2017-11-19 NOTE — Patient Instructions (Addendum)
Continue omeprazole Take 30- 60 min before your first and last meals of the day and zantac 150 mg at bedtime automatically and stop the calcium carbonate for now  GERD (REFLUX)  is an extremely common cause of respiratory symptoms just like yours , many times with no obvious heartburn at all.    It can be treated with medication, but also with lifestyle changes including elevation of the head of your bed (ideally with 6 inch  bed blocks),  Smoking cessation, avoidance of late meals, excessive alcohol, and avoid fatty foods, chocolate, peppermint, colas, red wine, and acidic juices such as orange juice.  NO MINT OR MENTHOL PRODUCTS SO NO COUGH DROPS  USE SUGARLESS CANDY INSTEAD (Jolley ranchers or Stover's or Life Savers) or even ice chips will also do - the key is to swallow to prevent all throat clearing. NO OIL BASED VITAMINS - use powdered substitutes.    Please remember to go to the lab and x-ray department downstairs in the basement  for your tests - we will call you with the results when they are available with additional recommendations

## 2017-11-19 NOTE — Progress Notes (Signed)
Subjective:     Patient ID: [name], female   DOB: Jul 10, 1935,    MRN: [id number]  HPI  30 yowf never smoker with good ex tolerance but noted in her 40's not as well with walking up hills but leveled off but much worse around fall 2016 > oct 2017 pacemaker > some better but still doe assoc with legs get heavy and give out assoc with sob and then Jan 2019 started cp epigastric sometimes rad to neck w/u by Dr Lovena Le with neg Brantley Fling 07/30/17 and referred to pulmonary clinic 11/19/2017 by Dr   Quay Burow p neg resp to rx per Dr Fuller Plan which included max gerd rx and trial of nsaids for possible mscp.    11/19/2017 1st [facility] office visit/ [name]   Chief Complaint  Patient presents with  . Consult    SOB - 2 years 2017 before pace maker was placed but still having SOB,   sob:  when bend over breathing is the worst, otherwise just with activity p legs give out first. Pattern is daily/ persistent and reproducible x 100 ft  Dysphagia > sense stuff  gets stuck in throat > gets sensation of  Strangling  occ just  at bedtime but most nights sleeps propped up on 1-2 pillows s noct awakening   Admits not consistent with gerd rx/ taking calcium cabonate pills   No obvious day to day or daytime variability or assoc excess/ purulent sputum or mucus plugs or hemoptysis  or chest tightness, subjective wheeze or overt sinus or hb symptoms. No unusual exposure hx or h/o childhood pna/ asthma or knowledge of premature birth.  Sleeping  Most noct ok on 2 pillows without nocturnal  or early am exacerbation  of respiratory  c/o's or need for noct saba. Also denies any obvious fluctuation of symptoms with weather or environmental changes or other aggravating or alleviating factors except as outlined above   Current Allergies, Complete Past Medical History, Past Surgical History, Family History, and Social History were reviewed in [employer] record.  ROS  The following are not active  complaints unless bolded Hoarseness, sore throat, dysphagia, dental problems, itching, sneezing,  nasal congestion or discharge of excess mucus or purulent secretions, ear ache,   fever, chills, sweats, unintended wt loss or wt gain, classically pleuritic or exertional cp,  orthopnea pnd or arm/hand swelling  or leg swelling, presyncope, palpitations, abdominal pain, anorexia, nausea, vomiting, diarrhea  or change in bowel habits or change in bladder habits, change in stools or change in urine, dysuria, hematuria,  rash, arthralgias, visual complaints, headache, numbness, weakness or ataxia or problems with walking or coordination,  change in mood or  memory.        Current Meds  Medication Sig  . acetaminophen (TYLENOL) 500 MG tablet Take 500 mg by mouth 2 (two) times daily.  [name] amitriptyline (ELAVIL) 10 MG tablet Take 2 tablets (20 mg total) by mouth at bedtime.  [name] aspirin 81 MG tablet Take 81 mg by mouth daily.  . Calcium Carbonate-Vit D-Min (CALTRATE 600+D PLUS MINERALS) 600-800 MG-UNIT CHEW Chew 3 tablets by mouth 1 day or 1 dose.   . cholecalciferol (VITAMIN D) 1000 UNITS tablet Take 2,000 Units by mouth daily.   . Cyanocobalamin (RA VITAMIN B-12 TR) 1000 MCG TBCR Take 1 tablet by mouth every other day.   . fluticasone (FLONASE) 50 MCG/ACT nasal spray Place 1 spray into both nostrils daily as needed for allergies.   [name]  loratadine (CLARITIN) 10 MG tablet Take 10 mg by mouth daily as needed for allergies.  [name] omeprazole (PRILOSEC) 40 MG capsule Take 1 capsule (40 mg total) by mouth 2 (two) times daily.  . polyethylene glycol (MIRALAX / GLYCOLAX) packet Take 17 g by mouth daily as needed (constipation).   . potassium chloride SA (K-DUR,KLOR-CON) 20 MEQ tablet Take 1 tablet (20 mEq total) by mouth daily.  . ranitidine (ZANTAC) 150 MG tablet Take 1 tablet (150 mg total) by mouth at bedtime as needed for heartburn.         Review of Systems     Objective:   Physical Exam    amb wf with  classic voice fatigue  Wt Readings from Last 3 Encounters:  11/19/17 163 lb (73.9 kg)  09/30/17 167 lb (75.8 kg)  09/09/17 167 lb (75.8 kg)     Vital signs reviewed - Note on arrival 02 sats  100% on RA    HEENT: nl dentition, turbinates bilaterally, and oropharynx. Nl external ear canals without cough reflex   NECK :  without JVD/Nodes/TM/ nl carotid upstrokes bilaterally   LUNGS: no acc muscle use,  Nl contour chest which is clear to A and P bilaterally without cough on insp or exp maneuvers   CV:  RRR  no s3 or murmur or increase in P2, and no edema   ABD:  soft and nontender with nl inspiratory excursion in the supine position. No bruits or organomegaly appreciated, bowel sounds nl  MS:  Nl gait  /ext warm without deformities, calf tenderness, cyanosis or clubbing No obvious joint restrictions   SKIN: warm and dry without lesions    NEURO:  alert, approp, nl sensorium with  no motor or cerebellar deficits apparent.        CXR PA and Lateral:   11/19/2017 :    I personally reviewed images and agree with radiology impression as follows:   Stable postop changes.  No acute disease   Labs ordered/ reviewed:      Chemistry      Component Value Date/Time   NA 140 11/19/2017 1708   NA 139 05/04/2017 1011   K 4.6 11/19/2017 1708   CL 101 11/19/2017 1708   CO2 26 11/19/2017 1708   BUN 24 (H) 11/19/2017 1708   BUN 19 05/04/2017 1011   CREATININE 1.44 (H) 11/19/2017 1708   CREATININE 0.93 05/02/2016 1651      Component Value Date/Time   CALCIUM 9.6 11/19/2017 1708   ALKPHOS 34 (L) 11/20/2016 1202   AST 11 11/20/2016 1202   ALT 8 11/20/2016 1202   BILITOT 0.3 11/20/2016 1202        Lab Results  Component Value Date   WBC 7.4 11/19/2017   HGB 12.1 11/19/2017   HCT 36.3 11/19/2017   MCV 96.3 11/19/2017   PLT 326.0 11/19/2017       EOS                                                               0.3                                    11/19/2017  Lab  Results  Component Value Date   TSH 2.56 11/19/2017     Lab Results  Component Value Date   PROBNP 164.0 (H) 11/19/2017       Lab Results  Component Value Date   ESRSEDRATE 11 11/19/2017        Labs ordered 11/19/2017  Allergy profile neg      Assessment:

## 2017-11-20 ENCOUNTER — Ambulatory Visit: Payer: Medicare Other | Admitting: Internal Medicine

## 2017-11-20 LAB — BASIC METABOLIC PANEL
BUN: 24 mg/dL — ABNORMAL HIGH (ref 6–23)
CALCIUM: 9.6 mg/dL (ref 8.4–10.5)
CHLORIDE: 101 meq/L (ref 96–112)
CO2: 26 meq/L (ref 19–32)
Creatinine, Ser: 1.44 mg/dL — ABNORMAL HIGH (ref 0.40–1.20)
GFR: 37.17 mL/min — ABNORMAL LOW (ref >60.00)
GLUCOSE: 100 mg/dL — AB (ref 70–99)
Potassium: 4.6 mEq/L (ref 3.5–5.1)
SODIUM: 140 meq/L (ref 135–145)

## 2017-11-20 LAB — RESPIRATORY ALLERGY PROFILE REGION II ~~LOC~~
Allergen, A. alternata, m6: <0.1 kU/L
Allergen, Cedar tree, t12: <0.1 kU/L
Allergen, Comm Silver Birch, t9: <0.1 kU/L
Allergen, Cottonwood, t14: <0.1 kU/L
Allergen, D pternoyssinus,d7: <0.1 kU/L
Allergen, Mulberry, t76: <0.1 kU/L
Aspergillus fumigatus, m3: <0.1 kU/L
Bermuda Grass: <0.1 kU/L
CLADOSPORIUM HERBARUM (M2) IGE: <0.1 kU/L
CLASS: 0
CLASS: 0
CLASS: 0
CLASS: 0
CLASS: 0
CLASS: 0
CLASS: 0
CLASS: 0
COMMON RAGWEED (SHORT) (W1) IGE: <0.1 kU/L
Cat Dander: <0.1 kU/L
Class: 0
Class: 0
Class: 0
Class: 0
Class: 0
Class: 0
Class: 0
Class: 0
Class: 0
Class: 0
Class: 0
Class: 0
Class: 0
Class: 0
Class: 0
Class: 0
Cockroach: <0.1 kU/L
Dog Dander: <0.1 kU/L
Pecan/Hickory Tree IgE: <0.1 kU/L
Rough Pigweed  IgE: <0.1 kU/L
Sheep Sorrel IgE: <0.1 kU/L

## 2017-11-20 LAB — BRAIN NATRIURETIC PEPTIDE: Pro B Natriuretic peptide (BNP): 164 pg/mL — ABNORMAL HIGH (ref 0.0–100.0)

## 2017-11-20 LAB — INTERPRETATION:

## 2017-11-20 LAB — TSH: TSH: 2.56 u[IU]/mL (ref 0.35–4.50)

## 2017-11-20 NOTE — Assessment & Plan Note (Addendum)
11/19/2017   Walked RA x one lap @ 185 stopped due to  Legs both buckled at same time s sense of presyncope and felt sob about the same time but sats fine, pulse 60 at end of walk at nl pace  Symptoms are markedly disproportionate to objective findings and not clear to what extent this is actually a pulmonary  problem but pt does appear to have difficult to sort out respiratory symptoms of unknown origin for which  DDX  = almost all start with A and  include Adherence, Ace Inhibitors, Acid Reflux, Active Sinus Disease, Alpha 1 Antitripsin deficiency, Anxiety masquerading as Airways dz,  ABPA,  Allergy(esp in young), Aspiration (esp in elderly), Adverse effects of meds,  Active smokers, A bunch of PE's/clot burden (a few small clots can't cause this syndrome unless there is already severe underlying pulm or vascular dz with poor reserve),  Anemia or thyroid disorder, plus two Bs  = Bronchiectasis and Beta blocker use..and one C= CHF     Adherence is always the initial "prime suspect" and is a multilayered concern that requires a "trust but verify" approach in every patient - starting with knowing how to use medications, especially inhalers, correctly, keeping up with refills and understanding the fundamental difference between maintenance and prns vs those medications only taken for a very short course and then stopped and not refilled.  - note not aherent with gerd rx or diet   ? Acid (or non-acid) GERD > always difficult to exclude as up to 75% of pts in some series report no assoc GI/ Heartburn symptoms> rec continue max (24h)  acid suppression and diet restrictions/ reviewed    - probably needs re-eval with MBS next but defer this to Dr Myra Rude to work out logistics  ? Anxiety/depression/ deconditioning  > usually at the bottom of this list of usual suspects but should be much higher on this pt's based on H and P and note already on psychotropics and may interfere with adherence and also  interpretation of response or lack thereof to symptom management which can be quite subjective > Follow up per Primary Care planned    ? Allergy/ asthma > very unlikely without assoc rhinitis/ noct symptoms and allergy profile neg   ? Adverse effects of meds >  ? Elavil contributing to sense of weakness in legs when walks or dry throat ?   ? Anemia/ thyroid dz>  Ruled out today   ? Cardiac > note pulse only 60 at end of walk ? Needs faster rate > defer to Dr Lovena Le   No evidence of a pulmonary cause for her problem- we can see her prn  Total time devoted to counseling  > 50 % of initial 60 min office visit:  review case with pt/ discussion of options/alternatives/ personally creating written customized instructions  in presence of pt  then going over those specific  Instructions directly with the pt including how to use all of the meds but in particular covering each new medication in detail and the difference between the maintenance= "automatic" meds and the prns using an action plan format for the latter (If this problem/symptom => do that organization reading Left to right).  Please see AVS from this visit for a full list of these instructions which I personally wrote for this pt and  are unique to this visit.

## 2017-11-20 NOTE — Progress Notes (Signed)
Spoke with pt and notified of results per Dr. Wert. Pt verbalized understanding and denied any questions. 

## 2017-11-21 ENCOUNTER — Encounter: Payer: Self-pay | Admitting: Internal Medicine

## 2017-11-23 NOTE — Progress Notes (Signed)
Spoke with pt and notified of results per Dr. Wert. Pt verbalized understanding and denied any questions. 

## 2017-11-26 DIAGNOSIS — H524 Presbyopia: Secondary | ICD-10-CM | POA: Diagnosis not present

## 2017-11-26 DIAGNOSIS — H01002 Unspecified blepharitis right lower eyelid: Secondary | ICD-10-CM | POA: Diagnosis not present

## 2017-11-26 DIAGNOSIS — H01004 Unspecified blepharitis left upper eyelid: Secondary | ICD-10-CM | POA: Diagnosis not present

## 2017-11-26 DIAGNOSIS — H01001 Unspecified blepharitis right upper eyelid: Secondary | ICD-10-CM | POA: Diagnosis not present

## 2017-11-26 DIAGNOSIS — H35373 Puckering of macula, bilateral: Secondary | ICD-10-CM | POA: Diagnosis not present

## 2017-11-26 DIAGNOSIS — H25813 Combined forms of age-related cataract, bilateral: Secondary | ICD-10-CM | POA: Diagnosis not present

## 2017-12-04 LAB — CUP PACEART REMOTE DEVICE CHECK
Implantable Lead Implant Date: 20171009
Implantable Lead Location: 753860
Implantable Lead Model: 5076
Implantable Lead Serial Number: 785430
Lead Channel Setting Pacing Amplitude: 2.5 V
Lead Channel Setting Pacing Pulse Width: 0.4 ms
Lead Channel Setting Sensing Sensitivity: 2.5 mV
MDC IDC LEAD IMPLANT DT: 20171009
MDC IDC LEAD LOCATION: 753859
MDC IDC PG IMPLANT DT: 20171009
MDC IDC PG SERIAL: 766467
MDC IDC SESS DTM: 20190510103515
MDC IDC SET LEADCHNL RA PACING AMPLITUDE: 2 V

## 2017-12-14 NOTE — Progress Notes (Signed)
Subjective:    Patient ID: [name], female    DOB: 03/03/1935, 83 y.o.   MRN: [id number]  HPI The patient is here for follow up.  She goes to the gym three days a week - circuit weights, walks on treadmill , bike.  Her legs get tired and feel very heavy with walking.  Sometimes she can push through it and sometimes she can't.  Sometimes she just feels like she needs to sit down.  Her legs will just give out on her at times.  Lower chest/upper epigastric tenderness: I thought this pain was likely neuralgia and she has been taking amitriptyline 20 mg at bedtime.  This did improve her pain.  Pulmonary thought it may be related to caltrate and she has stopped it for now.  It still bothers her and she feels like it limits her.  She is concerned it could be her hiatal hernia.  She tends to notice it most when she bends over.  Shortness of breath with exertion: She has seen cardiology and had a negative stress test.  I advised her to increase her activity at her last visit.  She did see pulmonary for this problem and no pulmonary cause was found.  Prediabetes:  She is compliant with a low sugar/carbohydrate diet.  She is exercising regularly.  GERD, dysphagia:  She still has difficulty swallowing.  She denies GERD symptoms.  She has had an EGD in the past and had dilation of her esophagus, but this was years ago.  She does have more difficulty swallowing pills and feels this is getting worse.  Osteoporosis: She is getting Prolia shots every 6 months.  She is not currently taking calcium and vitamin D.  She is exercising regularly.  Bone density is up-to-date.  Medications and allergies reviewed with patient and updated if appropriate.  Patient Active Problem List   Diagnosis Date Noted  . Epigastric pain 09/09/2017  . Neuralgia 09/09/2017  . Claudication of calf muscles ([place]) 11/22/2016  . DOE (dyspnea on exertion) 11/22/2016  . Diastolic dysfunction 09/32/3557  . Hyperreflexia of  lower extremity 07/16/2016  . Bilateral leg paresthesia 07/16/2016  . Carotid arterial disease ([place]) 07/13/2016  . Undiagnosed cardiac murmurs 06/23/2016  . Poor balance 06/23/2016  . Weakness of both lower extremities 06/23/2016  . Sinus node dysfunction ([place]) 05/05/2016  . Right shoulder pain 03/14/2016  . Multinodular goiter (nontoxic) 02/21/2016  . Neck pain 02/15/2016  . Pain in limb 02/15/2016  . Neck nodule 02/15/2016  . Prediabetes 12/17/2015  . Dysphagia 12/17/2015  . Anxiety 11/13/2015  . T8 vertebral fracture ([place]) 04/09/2015  . Overweight 10/09/2011  . Allergic rhinitis, cause unspecified   . GERD (gastroesophageal reflux disease) 02/05/2011  . FATIGUE 09/28/2009  . Vitamin B 12 deficiency 04/17/2009  . Hyperlipidemia 04/17/2009  . Osteoporosis 04/17/2009  . ADENOCARCINOMA, BREAST, HX OF 04/17/2009  . CONSTIPATION 03/08/2009  . COLONIC POLYPS, HYPERPLASTIC, HX OF 03/06/2009    Current Outpatient Medications on File Prior to Visit  Medication Sig Dispense Refill  . acetaminophen (TYLENOL) 500 MG tablet Take 500 mg by mouth 2 (two) times daily.    [name] amitriptyline (ELAVIL) 10 MG tablet Take 2 tablets (20 mg total) by mouth at bedtime. 60 tablet 3  . aspirin 81 MG tablet Take 81 mg by mouth daily.    . Calcium Carbonate-Vit D-Min (CALTRATE 600+D PLUS MINERALS) 600-800 MG-UNIT CHEW Chew 3 tablets by mouth 1 day or 1 dose.     [name]  cholecalciferol (VITAMIN D) 1000 UNITS tablet Take 2,000 Units by mouth daily.     . Cyanocobalamin (RA VITAMIN B-12 TR) 1000 MCG TBCR Take 1 tablet by mouth every other day.     . fluticasone (FLONASE) 50 MCG/ACT nasal spray Place 1 spray into both nostrils daily as needed for allergies.     [name] loratadine (CLARITIN) 10 MG tablet Take 10 mg by mouth daily as needed for allergies.    [name] omeprazole (PRILOSEC) 40 MG capsule Take 1 capsule (40 mg total) by mouth 2 (two) times daily. 60 capsule 11  . polyethylene glycol (MIRALAX / GLYCOLAX) packet Take  17 g by mouth daily as needed (constipation).     . potassium chloride SA (K-DUR,KLOR-CON) 20 MEQ tablet Take 1 tablet (20 mEq total) by mouth daily. 90 tablet 3  . ranitidine (ZANTAC) 150 MG tablet Take 1 tablet (150 mg total) by mouth at bedtime as needed for heartburn. 90 tablet 3  . furosemide (LASIX) 40 MG tablet Take 1 tablet (40 mg total) by mouth daily. 90 tablet 3   No current facility-administered medications on file prior to visit.     Past Medical History:  Diagnosis Date  . Cancer of left breast ([place]) 1978  . Chronic thoracic back pain    "T8; fracture; 03/2015; no OR" (05/05/2016)  . GERD (gastroesophageal reflux disease)   . Heart murmur   . History of hiatal hernia   . Hyperlipidemia   . Hyperplastic colon polyp   . IBS (irritable bowel syndrome)   . Multiple thyroid nodules   . Osteoporosis    T8 compression fx 03/2015   . Personal history of radiation therapy   . Presence of permanent cardiac pacemaker   . Vitamin B12 deficiency     Past Surgical History:  Procedure Laterality Date  . ANTERIOR CERVICAL DECOMP/DISCECTOMY FUSION  2008   C5-6  . AUGMENTATION MAMMAPLASTY    . BACK SURGERY    . BREAST SURGERY    . CARDIAC CATHETERIZATION  2001  . EP IMPLANTABLE DEVICE N/A 05/05/2016   Procedure: Pacemaker Implant;  Surgeon: Evans Lance, MD;  Location: [facility];  Service: Cardiovascular;  Laterality: N/A;  . EXCISIONAL HEMORRHOIDECTOMY  1984   With subsequent correction of surgery  . INSERT / REPLACE / REMOVE PACEMAKER    . KNEE ARTHROSCOPY Left    "meniscus tear"  . MASTECTOMY Left 1978  . PLACEMENT OF BREAST IMPLANTS Left 1981  . SHOULDER ARTHROSCOPY W/ ROTATOR CUFF REPAIR Left 02/2006   "tear"  . TUBAL LIGATION      Social History   Socioeconomic History  . Marital status: Married    Spouse name: Not on file  . Number of children: Not on file  . Years of education: Not on file  . Highest education level: Not on file  Occupational  History  . Not on file  Social Needs  . Financial resource strain: Not on file  . Food insecurity:    Worry: Not on file    Inability: Not on file  . Transportation needs:    Medical: Not on file    Non-medical: Not on file  Tobacco Use  . Smoking status: Never Smoker  . Smokeless tobacco: Never Used  Substance and Sexual Activity  . Alcohol use: No    Alcohol/week: 0.0 oz  . Drug use: No  . Sexual activity: Not Currently  Lifestyle  . Physical activity:    Days per week: Not  on file    Minutes per session: Not on file  . Stress: Not on file  Relationships  . Social connections:    Talks on phone: Not on file    Gets together: Not on file    Attends religious service: Not on file    Active member of club or organization: Not on file    Attends meetings of clubs or organizations: Not on file    Relationship status: Not on file  Other Topics Concern  . Not on file  Social History Narrative   Housewife, Lives with spouse, 2 children    Family History  Problem Relation Age of Onset  . Diabetes Sister   . Diabetes Brother   . Breast cancer Maternal Aunt   . Colon cancer Neg Hx     Review of Systems  Constitutional: Negative for chills and fever.  Respiratory: Negative for cough, shortness of breath and wheezing.   Cardiovascular: Negative for chest pain, palpitations and leg swelling.  Neurological: Positive for weakness (intermittent legs - occasionally give out on her) and numbness (left arm from shoulder). Negative for light-headedness and headaches.       Objective:   Vitals:   12/15/17 0904  BP: 130/64  Pulse: 60  Resp: 16  Temp: 98.1 F (36.7 C)  SpO2: 98%   BP Readings from Last 3 Encounters:  12/15/17 130/64  11/19/17 120/62  09/30/17 114/64   Wt Readings from Last 3 Encounters:  12/15/17 163 lb (73.9 kg)  11/19/17 163 lb (73.9 kg)  09/30/17 167 lb (75.8 kg)   Body mass index is 30.8 kg/m.   Physical Exam    Constitutional: Appears  well-developed and well-nourished. No distress.  When she got up to walk to the table her legs did block when she was unable to walk unassisted.  He continued to buckle repeatedly and this seemed to be coming from her knees. HENT:  Head: Normocephalic and atraumatic.  Neck: Neck supple. No tracheal deviation present. No thyromegaly present.  No cervical lymphadenopathy Cardiovascular: Normal rate, regular rhythm and normal heart sounds.    No carotid bruit .  No edema Pulmonary/Chest: Effort normal and breath sounds normal. No respiratory distress. No has no wheezes. No rales.  Skin: Skin is warm and dry. Not diaphoretic.  Psychiatric: Normal mood and affect. Behavior is normal.      Assessment & Plan:    See Problem List for Assessment and Plan of chronic medical problems.

## 2017-12-15 ENCOUNTER — Other Ambulatory Visit (INDEPENDENT_AMBULATORY_CARE_PROVIDER_SITE_OTHER): Payer: Medicare Other

## 2017-12-15 ENCOUNTER — Encounter: Payer: Self-pay | Admitting: Internal Medicine

## 2017-12-15 ENCOUNTER — Ambulatory Visit (INDEPENDENT_AMBULATORY_CARE_PROVIDER_SITE_OTHER): Payer: Medicare Other | Admitting: Internal Medicine

## 2017-12-15 VITALS — BP 130/64 | HR 60 | Temp 98.1°F | Resp 16 | Wt 163.0 lb

## 2017-12-15 DIAGNOSIS — M170 Bilateral primary osteoarthritis of knee: Secondary | ICD-10-CM

## 2017-12-15 DIAGNOSIS — R0600 Dyspnea, unspecified: Secondary | ICD-10-CM

## 2017-12-15 DIAGNOSIS — I200 Unstable angina: Secondary | ICD-10-CM | POA: Diagnosis not present

## 2017-12-15 DIAGNOSIS — R0609 Other forms of dyspnea: Secondary | ICD-10-CM | POA: Diagnosis not present

## 2017-12-15 DIAGNOSIS — R7303 Prediabetes: Secondary | ICD-10-CM

## 2017-12-15 DIAGNOSIS — R1310 Dysphagia, unspecified: Secondary | ICD-10-CM

## 2017-12-15 DIAGNOSIS — M810 Age-related osteoporosis without current pathological fracture: Secondary | ICD-10-CM

## 2017-12-15 DIAGNOSIS — K219 Gastro-esophageal reflux disease without esophagitis: Secondary | ICD-10-CM | POA: Diagnosis not present

## 2017-12-15 LAB — COMPREHENSIVE METABOLIC PANEL
ALT: 7 U/L (ref 0–35)
AST: 9 U/L (ref 0–37)
Albumin: 4.1 g/dL (ref 3.5–5.2)
Alkaline Phosphatase: 37 U/L — ABNORMAL LOW (ref 39–117)
BILIRUBIN TOTAL: 0.3 mg/dL (ref 0.2–1.2)
BUN: 19 mg/dL (ref 6–23)
CO2: 26 meq/L (ref 19–32)
CREATININE: 1.1 mg/dL (ref 0.40–1.20)
Calcium: 9.1 mg/dL (ref 8.4–10.5)
Chloride: 102 mEq/L (ref 96–112)
GFR: 50.71 mL/min — ABNORMAL LOW (ref >60.00)
GLUCOSE: 101 mg/dL — AB (ref 70–99)
Potassium: 4.4 mEq/L (ref 3.5–5.1)
SODIUM: 137 meq/L (ref 135–145)
Total Protein: 7.1 g/dL (ref 6.0–8.3)

## 2017-12-15 LAB — HEMOGLOBIN A1C: Hgb A1c MFr Bld: 6.2 % (ref 4.6–6.5)

## 2017-12-15 NOTE — Assessment & Plan Note (Signed)
Continue Prolia injections every 6 months Calcium currently on hold due to concern for it was causing her epigastric discomfort-I do not think this is the case and advised that if she is not seeing any improvement to restart her calcium Continue regular exercise

## 2017-12-15 NOTE — Assessment & Plan Note (Signed)
Overall heartburn sounds controlled, but is experiencing increasing dysphagia Has had her esophagus dilated in the past Advise follow-up with Dr. Fuller Plan

## 2017-12-15 NOTE — Assessment & Plan Note (Addendum)
Having intermittent dysphagia History of esophageal stricture with dilation years ago Advised f/u with GI

## 2017-12-15 NOTE — Assessment & Plan Note (Signed)
Bilateral knees buckling-she has been experiencing her legs giving out on her and is not been able to identify if it was the legs or the knees Watching her today look like it could have been her knees giving out on her No significant knee pain with activity Will refer to orthopedics for further evaluation of possible knee osteoarthritis and buckling of the knees

## 2017-12-15 NOTE — Patient Instructions (Addendum)
Follow up with Dr Fuller Plan.   Test(s) ordered today. Your results will be released to [facility] (or called to you) after review, usually within 72hours after test completion. If any changes need to be made, you will be notified at that same time.   Medications reviewed and updated.   No changes recommended at this time.   A referral was ordered for Dr Alfonso Ramus.   Please followup in 6 months

## 2017-12-15 NOTE — Assessment & Plan Note (Signed)
No cardiac or pulmonary cause for shortness of breath with exertion She is exercising regularly and with regular exercise this fatigue and shortness of breath has not improved She continues to have buckling of her legs-possibly knees We will have this evaluated further

## 2017-12-15 NOTE — Assessment & Plan Note (Signed)
Check A1c She is compliant with a low sugar/carb hydrate diet Continue regular exercise

## 2017-12-17 ENCOUNTER — Ambulatory Visit (INDEPENDENT_AMBULATORY_CARE_PROVIDER_SITE_OTHER): Payer: Medicare Other | Admitting: *Deleted

## 2017-12-17 VITALS — BP 118/62 | HR 86 | Resp 18 | Ht 61.0 in | Wt 162.0 lb

## 2017-12-17 DIAGNOSIS — Z0000 Encounter for general adult medical examination without abnormal findings: Secondary | ICD-10-CM

## 2017-12-17 NOTE — Progress Notes (Addendum)
Subjective:   [name] is a 83 y.o. female who presents for Medicare Annual (Subsequent) preventive examination.  Review of Systems:  No ROS.  Medicare Wellness Visit. Additional risk factors are reflected in the social history.  Cardiac Risk Factors include: advanced age (>32men, >35 women);dyslipidemia Sleep patterns: feels rested on waking, gets up 1-2 times nightly to void and sleeps 7 hours nightly.    Home Safety/Smoke Alarms: Feels safe in home. Smoke alarms in place.  Living environment; residence and Firearm Safety: 1-story house/ trailer, no firearms, Lives with husband, no needs for DME, good support system. Seat Belt Safety/Bike Helmet: Wears seat belt.      Objective:     Vitals: BP 118/62   Pulse 86   Resp 18   Ht 5\' 1"  (1.549 m)   Wt 162 lb (73.5 kg)   SpO2 100%   BMI 30.61 kg/m   Body mass index is 30.61 kg/m.  Advanced Directives 12/17/2017 01/26/2017 12/10/2016 07/01/2016 05/05/2016  Does Patient Have a Medical Advance Directive? Yes Yes Yes No Yes  Type of Paramedic of [place];Living will [place];Living will [place];Living will - Living will  Does patient want to make changes to medical advance directive? - - - - No - Patient declined  Copy of [place] in Chart? No - copy requested - No - copy requested - No - copy requested  Would patient like information on creating a medical advance directive? - - - No - Patient declined -    Tobacco Social History   Tobacco Use  Smoking Status Never Smoker  Smokeless Tobacco Never Used     Counseling given: Not Answered  Past Medical History:  Diagnosis Date  . Cancer of left breast ([place]) 1978  . Chronic thoracic back pain    "T8; fracture; 03/2015; no OR" (05/05/2016)  . GERD (gastroesophageal reflux disease)   . Heart murmur   . History of hiatal hernia   . Hyperlipidemia   . Hyperplastic colon polyp   . IBS  (irritable bowel syndrome)   . Multiple thyroid nodules   . Osteoporosis    T8 compression fx 03/2015   . Personal history of radiation therapy   . Presence of permanent cardiac pacemaker   . Vitamin B12 deficiency    Past Surgical History:  Procedure Laterality Date  . ANTERIOR CERVICAL DECOMP/DISCECTOMY FUSION  2008   C5-6  . AUGMENTATION MAMMAPLASTY    . BACK SURGERY    . BREAST SURGERY    . CARDIAC CATHETERIZATION  2001  . EP IMPLANTABLE DEVICE N/A 05/05/2016   Procedure: Pacemaker Implant;  Surgeon: Evans Lance, MD;  Location: [facility];  Service: Cardiovascular;  Laterality: N/A;  . EXCISIONAL HEMORRHOIDECTOMY  1984   With subsequent correction of surgery  . INSERT / REPLACE / REMOVE PACEMAKER    . KNEE ARTHROSCOPY Left    "meniscus tear"  . MASTECTOMY Left 1978  . PLACEMENT OF BREAST IMPLANTS Left 1981  . SHOULDER ARTHROSCOPY W/ ROTATOR CUFF REPAIR Left 02/2006   "tear"  . TUBAL LIGATION     Family History  Problem Relation Age of Onset  . Diabetes Sister   . Diabetes Brother   . Breast cancer Maternal Aunt   . Colon cancer Neg Hx    Social History   Socioeconomic History  . Marital status: Married    Spouse name: Not on file  . Number of  children: 2  . Years of education: Not on file  . Highest education level: Not on file  Occupational History  . Not on file  Social Needs  . Financial resource strain: Not hard at all  . Food insecurity:    Worry: Never true    Inability: Never true  . Transportation needs:    Medical: No    Non-medical: No  Tobacco Use  . Smoking status: Never Smoker  . Smokeless tobacco: Never Used  Substance and Sexual Activity  . Alcohol use: No    Alcohol/week: 0.0 oz  . Drug use: No  . Sexual activity: Not Currently  Lifestyle  . Physical activity:    Days per week: 3 days    Minutes per session: 50 min  . Stress: Only a little  Relationships  . Social connections:    Talks on phone: More than three times a  week    Gets together: More than three times a week    Attends religious service: More than 4 times per year    Active member of club or organization: Yes    Attends meetings of clubs or organizations: More than 4 times per year    Relationship status: Married  Other Topics Concern  . Not on file  Social History Narrative   Housewife, Lives with spouse, 2 children    Outpatient Encounter Medications as of 12/17/2017  Medication Sig  . acetaminophen (TYLENOL) 500 MG tablet Take 500 mg by mouth 2 (two) times daily.  [name] amitriptyline (ELAVIL) 10 MG tablet Take 2 tablets (20 mg total) by mouth at bedtime.  [name] aspirin 81 MG tablet Take 81 mg by mouth daily.  . Calcium Carbonate-Vit D-Min (CALTRATE 600+D PLUS MINERALS) 600-800 MG-UNIT CHEW Chew 3 tablets by mouth 1 day or 1 dose.   . cholecalciferol (VITAMIN D) 1000 UNITS tablet Take 2,000 Units by mouth daily.   . Cyanocobalamin (RA VITAMIN B-12 TR) 1000 MCG TBCR Take 1 tablet by mouth every other day.   . fluticasone (FLONASE) 50 MCG/ACT nasal spray Place 1 spray into both nostrils daily as needed for allergies.   [name] loratadine (CLARITIN) 10 MG tablet Take 10 mg by mouth daily as needed for allergies.  [name] omeprazole (PRILOSEC) 40 MG capsule Take 1 capsule (40 mg total) by mouth 2 (two) times daily.  . polyethylene glycol (MIRALAX / GLYCOLAX) packet Take 17 g by mouth daily as needed (constipation).   . potassium chloride SA (K-DUR,KLOR-CON) 20 MEQ tablet Take 1 tablet (20 mEq total) by mouth daily.  . ranitidine (ZANTAC) 150 MG tablet Take 1 tablet (150 mg total) by mouth at bedtime as needed for heartburn.  . furosemide (LASIX) 40 MG tablet Take 1 tablet (40 mg total) by mouth daily.   No facility-administered encounter medications on file as of 12/17/2017.     Activities of Daily Living In your present state of health, do you have any difficulty performing the following activities: 12/17/2017  Hearing? N  Vision? N  Difficulty concentrating  or making decisions? N  Walking or climbing stairs? N  Dressing or bathing? N  Doing errands, shopping? N  Preparing Food and eating ? N  Using the Toilet? N  In the past six months, have you accidently leaked urine? N  Do you have problems with loss of bowel control? N  Managing your Medications? N  Managing your Finances? N  Housekeeping or managing your Housekeeping? N  Some recent data might be hidden  Patient Care Team: [name], MD as PCP - General (Internal Medicine) Ladene Artist, MD (Gastroenterology) Servando Salina, MD (Obstetrics and Gynecology) Melida Quitter, MD as Consulting Physician (Otolaryngology) Allyn Kenner, MD (Dermatology) Calvert Cantor, MD (Ophthalmology) Berle Mull, MD (Sports Medicine) Jovita Gamma, MD as Consulting Physician (Neurosurgery) Evans Lance, MD as Consulting Physician (Cardiology) [place], Peter M, MD as Consulting Physician (Cardiology)    Assessment:   This is a routine wellness examination for Sarita. Physical assessment deferred to PCP.   Exercise Activities and Dietary recommendations Current Exercise Habits: Structured exercise class, Type of exercise: treadmill;strength training/weights, Time (Minutes): 45, Frequency (Times/Week): 3, Weekly Exercise (Minutes/Week): 135, Exercise limited by: orthopedic condition(s)  Diet (meal preparation, eat out, water intake, caffeinated beverages, dairy products, fruits and vegetables): in general, a "healthy" diet  , well balanced, eats a variety of fruits and vegetables daily, limits salt, fat/cholesterol, sugar,carbohydrates,caffeine, drinks 6-8 glasses of water daily.  Reviewed heart healthy and diabetic diet and discussed weight loss strategies. Diet education was provided via handout.  Goals    . Patient Stated     Continue to exercise, eat healthy, worship God, enjoy family and life.       Fall Risk Fall Risk  12/17/2017 06/26/2017 12/10/2016 11/17/2016 07/16/2016   Falls in the past year? No No No No No  Comment - - - - -  Number falls in past yr: - - - - -  Injury with Fall? - - - - -  Risk for fall due to : - - - - -  Follow up - - - - -    Depression Screen PHQ 2/9 Scores 12/17/2017 12/10/2016 03/14/2016 12/20/2014  PHQ - 2 Score 0 0 0 0     Cognitive Function MMSE - Mini Mental State Exam 12/17/2017  Not completed: Refused       Ad8 score reviewed for issues:  Issues making decisions: no  Less interest in hobbies / activities: no  Repeats questions, stories (family complaining): no  Trouble using ordinary gadgets (microwave, computer, phone):no  Forgets the month or year: no  Mismanaging finances: no  Remembering appts: no  Daily problems with thinking and/or memory: no Ad8 score is= 0    Immunization History  Administered Date(s) Administered  . Influenza Split 06/27/2011, 06/09/2012  . Influenza Whole 07/09/2009, 07/02/2010  . Influenza, High Dose Seasonal PF 05/02/2013, 05/31/2014, 06/26/2015, 06/02/2016, 05/22/2017  . Pneumococcal Conjugate-13 06/19/2014  . Pneumococcal Polysaccharide-23 07/28/2000, 10/09/2011  . Td 07/28/2000  . Tdap 10/15/2010  . Zoster 03/12/2011   Screening Tests Health Maintenance  Topic Date Due  . INFLUENZA VACCINE  02/25/2018  . DEXA SCAN  06/23/2018  . TETANUS/TDAP  10/14/2020  . PNA vac Low Risk Adult  Completed      Plan:    Continue doing brain stimulating activities (puzzles, reading, adult coloring books, staying active) to keep memory sharp.   Continue to eat heart healthy diet (full of fruits, vegetables, whole grains, lean protein, water--limit salt, fat, and sugar intake) and increase physical activity as tolerated.  I have personally reviewed and noted the following in the patient's chart:   . Medical and social history . Use of alcohol, tobacco or illicit drugs  . Current medications and supplements . Functional ability and status . Nutritional status . Physical  activity . Advanced directives . List of other physicians . Vitals . Screenings to include cognitive, depression, and falls . Referrals and appointments  In addition, I have reviewed  and discussed with patient certain preventive protocols, quality metrics, and best practice recommendations. A written personalized care plan for preventive services as well as general preventive health recommendations were provided to patient.     Michiel Cowboy, RN  12/17/2017   Medical screening examination/treatment/procedure(s) were performed by non-physician practitioner and as supervising physician I was immediately available for consultation/collaboration. I agree with above. [name], MD

## 2017-12-17 NOTE — Patient Instructions (Signed)
Continue doing brain stimulating activities (puzzles, reading, adult coloring books, staying active) to keep memory sharp.   Continue to eat heart healthy diet (full of fruits, vegetables, whole grains, lean protein, water--limit salt, fat, and sugar intake) and increase physical activity as tolerated.   [name] , Thank you for taking time to come for your Medicare Wellness Visit. I appreciate your ongoing commitment to your health goals. Please review the following plan we discussed and let me know if I can assist you in the future.   These are the goals we discussed: Goals    . Patient Stated     Continue to exercise, eat healthy, worship God, enjoy family and life.       This is a list of the screening recommended for you and due dates:  Health Maintenance  Topic Date Due  . Flu Shot  02/25/2018  . DEXA scan (bone density measurement)  06/23/2018  . Tetanus Vaccine  10/14/2020  . Pneumonia vaccines  Completed   It is important to avoid accidents which may result in broken bones.  Here are a few ideas on how to make your home safer so you will be less likely to trip or fall.  1. Use nonskid mats or non slip strips in your shower or tub, on your bathroom floor and around sinks.  If you know that you have spilled water, wipe it up! 2. In the bathroom, it is important to have properly installed grab bars on the walls or on the edge of the tub.  Towel racks are NOT strong enough for you to hold onto or to pull on for support. 3. Stairs and hallways should have enough light.  Add lamps or night lights if you need ore light. 4. It is good to have handrails on both sides of the stairs if possible.  Always fix broken handrails right away. 5. It is important to see the edges of steps.  Paint the edges of outdoor steps white so you can see them better.  Put colored tape on the edge of inside steps. 6. Throw-rugs are dangerous because they can slide.  Removing the rugs is the best idea, but  if they must stay, add adhesive carpet tape to prevent slipping. 7. Do not keep things on stairs or in the halls.  Remove small furniture that blocks the halls as it may cause you to trip.  Keep telephone and electrical cords out of the way where you walk. 8. Always were sturdy, rubber-soled shoes for good support.  Never wear just socks, especially on the stairs.  Socks may cause you to slip or fall.  Do not wear full-length housecoats as you can easily trip on the bottom.  9. Place the things you use the most on the shelves that are the easiest to reach.  If you use a stepstool, make sure it is in good condition.  If you feel unsteady, DO NOT climb, ask for help. If a health professional advises you to use a cane or walker, do not be ashamed.  These items can keep you from falling and breaking your bones.Health Maintenance, Female Adopting a healthy lifestyle and getting preventive care can go a long way to promote health and wellness. Talk with your health care provider about what schedule of regular examinations is right for you. This is a good chance for you to check in with your provider about disease prevention and staying healthy. In between checkups, there are plenty of things  you can do on your own. Experts have done a lot of research about which lifestyle changes and preventive measures are most likely to keep you healthy. Ask your health care provider for more information. Weight and diet Eat a healthy diet  Be sure to include plenty of vegetables, fruits, low-fat dairy products, and lean protein.  Do not eat a lot of foods high in solid fats, added sugars, or salt.  Get regular exercise. This is one of the most important things you can do for your health. ? Most adults should exercise for at least 150 minutes each week. The exercise should increase your heart rate and make you sweat (moderate-intensity exercise). ? Most adults should also do strengthening exercises at least twice a week.  This is in addition to the moderate-intensity exercise.  Maintain a healthy weight  Body mass index (BMI) is a measurement that can be used to identify possible weight problems. It estimates body fat based on height and weight. Your health care provider can help determine your BMI and help you achieve or maintain a healthy weight.  For females 95 years of age and older: ? A BMI below 18.5 is considered underweight. ? A BMI of 18.5 to 24.9 is normal. ? A BMI of 25 to 29.9 is considered overweight. ? A BMI of 30 and above is considered obese.  Watch levels of cholesterol and blood lipids  You should start having your blood tested for lipids and cholesterol at 81 years of age, then have this test every 5 years.  You may need to have your cholesterol levels checked more often if: ? Your lipid or cholesterol levels are high. ? You are older than 81 years of age. ? You are at high risk for heart disease.  Cancer screening Lung Cancer  Lung cancer screening is recommended for adults 3-67 years old who are at high risk for lung cancer because of a history of smoking.  A yearly low-dose CT scan of the lungs is recommended for people who: ? Currently smoke. ? Have quit within the past 15 years. ? Have at least a 30-pack-year history of smoking. A pack year is smoking an average of one pack of cigarettes a day for 1 year.  Yearly screening should continue until it has been 15 years since you quit.  Yearly screening should stop if you develop a health problem that would prevent you from having lung cancer treatment.  Breast Cancer  Practice breast self-awareness. This means understanding how your breasts normally appear and feel.  It also means doing regular breast self-exams. Let your health care provider know about any changes, no matter how small.  If you are in your 20s or 30s, you should have a clinical breast exam (CBE) by a health care provider every 1-3 years as part of a  regular health exam.  If you are 42 or older, have a CBE every year. Also consider having a breast X-ray (mammogram) every year.  If you have a family history of breast cancer, talk to your health care provider about genetic screening.  If you are at high risk for breast cancer, talk to your health care provider about having an MRI and a mammogram every year.  Breast cancer gene (BRCA) assessment is recommended for women who have family members with BRCA-related cancers. BRCA-related cancers include: ? Breast. ? Ovarian. ? Tubal. ? Peritoneal cancers.  Results of the assessment will determine the need for genetic counseling and BRCA1 and BRCA2  testing.  Cervical Cancer Your health care provider may recommend that you be screened regularly for cancer of the pelvic organs (ovaries, uterus, and vagina). This screening involves a pelvic examination, including checking for microscopic changes to the surface of your cervix (Pap test). You may be encouraged to have this screening done every 3 years, beginning at age 23.  For women ages 18-65, health care providers may recommend pelvic exams and Pap testing every 3 years, or they may recommend the Pap and pelvic exam, combined with testing for human papilloma virus (HPV), every 5 years. Some types of HPV increase your risk of cervical cancer. Testing for HPV may also be done on women of any age with unclear Pap test results.  Other health care providers may not recommend any screening for nonpregnant women who are considered low risk for pelvic cancer and who do not have symptoms. Ask your health care provider if a screening pelvic exam is right for you.  If you have had past treatment for cervical cancer or a condition that could lead to cancer, you need Pap tests and screening for cancer for at least 20 years after your treatment. If Pap tests have been discontinued, your risk factors (such as having a new sexual partner) need to be reassessed to  determine if screening should resume. Some women have medical problems that increase the chance of getting cervical cancer. In these cases, your health care provider may recommend more frequent screening and Pap tests.  Colorectal Cancer  This type of cancer can be detected and often prevented.  Routine colorectal cancer screening usually begins at 81 years of age and continues through 81 years of age.  Your health care provider may recommend screening at an earlier age if you have risk factors for colon cancer.  Your health care provider may also recommend using home test kits to check for hidden blood in the stool.  A small camera at the end of a tube can be used to examine your colon directly (sigmoidoscopy or colonoscopy). This is done to check for the earliest forms of colorectal cancer.  Routine screening usually begins at age 10.  Direct examination of the colon should be repeated every 5-10 years through 81 years of age. However, you may need to be screened more often if early forms of precancerous polyps or small growths are found.  Skin Cancer  Check your skin from head to toe regularly.  Tell your health care provider about any new moles or changes in moles, especially if there is a change in a mole's shape or color.  Also tell your health care provider if you have a mole that is larger than the size of a pencil eraser.  Always use sunscreen. Apply sunscreen liberally and repeatedly throughout the day.  Protect yourself by wearing long sleeves, pants, a wide-brimmed hat, and sunglasses whenever you are outside.  Heart disease, diabetes, and high blood pressure  High blood pressure causes heart disease and increases the risk of stroke. High blood pressure is more likely to develop in: ? People who have blood pressure in the high end of the normal range (130-139/85-89 mm Hg). ? People who are overweight or obese. ? People who are African American.  If you are 18-39 years  of age, have your blood pressure checked every 3-5 years. If you are 102 years of age or older, have your blood pressure checked every year. You should have your blood pressure measured twice-once when you are at  a hospital or clinic, and once when you are not at a hospital or clinic. Record the average of the two measurements. To check your blood pressure when you are not at a hospital or clinic, you can use: ? An automated blood pressure machine at a pharmacy. ? A home blood pressure monitor.  If you are between 55 years and 86 years old, ask your health care provider if you should take aspirin to prevent strokes.  Have regular diabetes screenings. This involves taking a blood sample to check your fasting blood sugar level. ? If you are at a normal weight and have a low risk for diabetes, have this test once every three years after 81 years of age. ? If you are overweight and have a high risk for diabetes, consider being tested at a younger age or more often. Preventing infection Hepatitis B  If you have a higher risk for hepatitis B, you should be screened for this virus. You are considered at high risk for hepatitis B if: ? You were born in a country where hepatitis B is common. Ask your health care provider which countries are considered high risk. ? Your parents were born in a high-risk country, and you have not been immunized against hepatitis B (hepatitis B vaccine). ? You have HIV or AIDS. ? You use needles to inject street drugs. ? You live with someone who has hepatitis B. ? You have had sex with someone who has hepatitis B. ? You get hemodialysis treatment. ? You take certain medicines for conditions, including cancer, organ transplantation, and autoimmune conditions.  Hepatitis C  Blood testing is recommended for: ? Everyone born from [birth year] through 1963. ? Anyone with known risk factors for hepatitis C.  Sexually transmitted infections (STIs)  You should be screened for  sexually transmitted infections (STIs) including gonorrhea and chlamydia if: ? You are sexually active and are younger than 81 years of age. ? You are older than 81 years of age and your health care provider tells you that you are at risk for this type of infection. ? Your sexual activity has changed since you were last screened and you are at an increased risk for chlamydia or gonorrhea. Ask your health care provider if you are at risk.  If you do not have HIV, but are at risk, it may be recommended that you take a prescription medicine daily to prevent HIV infection. This is called pre-exposure prophylaxis (PrEP). You are considered at risk if: ? You are sexually active and do not regularly use condoms or know the HIV status of your partner(s). ? You take drugs by injection. ? You are sexually active with a partner who has HIV.  Talk with your health care provider about whether you are at high risk of being infected with HIV. If you choose to begin PrEP, you should first be tested for HIV. You should then be tested every 3 months for as long as you are taking PrEP. Pregnancy  If you are premenopausal and you may become pregnant, ask your health care provider about preconception counseling.  If you may become pregnant, take 400 to 800 micrograms (mcg) of folic acid every day.  If you want to prevent pregnancy, talk to your health care provider about birth control (contraception). Osteoporosis and menopause  Osteoporosis is a disease in which the bones lose minerals and strength with aging. This can result in serious bone fractures. Your risk for osteoporosis can be identified using a bone  density scan.  If you are 72 years of age or older, or if you are at risk for osteoporosis and fractures, ask your health care provider if you should be screened.  Ask your health care provider whether you should take a calcium or vitamin D supplement to lower your risk for osteoporosis.  Menopause may  have certain physical symptoms and risks.  Hormone replacement therapy may reduce some of these symptoms and risks. Talk to your health care provider about whether hormone replacement therapy is right for you. Follow these instructions at home:  Schedule regular health, dental, and eye exams.  Stay current with your immunizations.  Do not use any tobacco products including cigarettes, chewing tobacco, or electronic cigarettes.  If you are pregnant, do not drink alcohol.  If you are breastfeeding, limit how much and how often you drink alcohol.  Limit alcohol intake to no more than 1 drink per day for nonpregnant women. One drink equals 12 ounces of beer, 5 ounces of Geral Tuch, or 1 ounces of hard liquor.  Do not use street drugs.  Do not share needles.  Ask your health care provider for help if you need support or information about quitting drugs.  Tell your health care provider if you often feel depressed.  Tell your health care provider if you have ever been abused or do not feel safe at home. This information is not intended to replace advice given to you by your health care provider. Make sure you discuss any questions you have with your health care provider. Document Released: 01/27/2011 Document Revised: 12/20/2015 Document Reviewed: 04/17/2015 Elsevier Interactive Patient Education  [employer].

## 2017-12-23 ENCOUNTER — Other Ambulatory Visit: Payer: Self-pay | Admitting: Sports Medicine

## 2017-12-23 DIAGNOSIS — M48061 Spinal stenosis, lumbar region without neurogenic claudication: Secondary | ICD-10-CM | POA: Diagnosis not present

## 2017-12-23 DIAGNOSIS — G8929 Other chronic pain: Secondary | ICD-10-CM

## 2017-12-23 DIAGNOSIS — M25561 Pain in right knee: Secondary | ICD-10-CM | POA: Diagnosis not present

## 2017-12-23 DIAGNOSIS — M25562 Pain in left knee: Secondary | ICD-10-CM | POA: Diagnosis not present

## 2017-12-23 DIAGNOSIS — M545 Low back pain, unspecified: Secondary | ICD-10-CM

## 2017-12-25 ENCOUNTER — Ambulatory Visit
Admission: RE | Admit: 2017-12-25 | Discharge: 2017-12-25 | Disposition: A | Discharge status: Home / Self Care | Payer: Medicare Other | Source: Ambulatory Visit | Attending: Internal Medicine | Admitting: Internal Medicine

## 2017-12-25 DIAGNOSIS — Z1231 Encounter for screening mammogram for malignant neoplasm of breast: Secondary | ICD-10-CM | POA: Diagnosis not present

## 2017-12-28 ENCOUNTER — Ambulatory Visit: Payer: Medicare Other | Admitting: Internal Medicine

## 2018-01-01 ENCOUNTER — Ambulatory Visit
Admission: RE | Admit: 2018-01-01 | Discharge: 2018-01-01 | Disposition: A | Discharge status: Home / Self Care | Payer: Medicare Other | Source: Ambulatory Visit | Attending: Sports Medicine | Admitting: Sports Medicine

## 2018-01-01 DIAGNOSIS — M545 Low back pain, unspecified: Secondary | ICD-10-CM

## 2018-01-01 DIAGNOSIS — G8929 Other chronic pain: Secondary | ICD-10-CM

## 2018-01-05 DIAGNOSIS — M545 Low back pain: Secondary | ICD-10-CM | POA: Diagnosis not present

## 2018-01-07 ENCOUNTER — Telehealth: Payer: Self-pay | Admitting: Internal Medicine

## 2018-01-07 DIAGNOSIS — H01005 Unspecified blepharitis left lower eyelid: Secondary | ICD-10-CM | POA: Diagnosis not present

## 2018-01-07 DIAGNOSIS — H25813 Combined forms of age-related cataract, bilateral: Secondary | ICD-10-CM | POA: Diagnosis not present

## 2018-01-07 DIAGNOSIS — H01001 Unspecified blepharitis right upper eyelid: Secondary | ICD-10-CM | POA: Diagnosis not present

## 2018-01-07 DIAGNOSIS — H01002 Unspecified blepharitis right lower eyelid: Secondary | ICD-10-CM | POA: Diagnosis not present

## 2018-01-07 DIAGNOSIS — H01004 Unspecified blepharitis left upper eyelid: Secondary | ICD-10-CM | POA: Diagnosis not present

## 2018-01-07 NOTE — Telephone Encounter (Signed)
Pt calling and stated she will be going out of town for [place] and want to know if she need to take her monitor with her. Please advise pt

## 2018-01-07 NOTE — Telephone Encounter (Signed)
Called to inform patient that she does not need to take her Latitude monitor with her on her trip. Patient verbalized understanding.

## 2018-01-10 ENCOUNTER — Encounter: Payer: Self-pay | Admitting: Internal Medicine

## 2018-01-10 DIAGNOSIS — M48061 Spinal stenosis, lumbar region without neurogenic claudication: Secondary | ICD-10-CM | POA: Insufficient documentation

## 2018-01-12 ENCOUNTER — Telehealth: Payer: Self-pay | Admitting: Internal Medicine

## 2018-01-12 NOTE — Telephone Encounter (Signed)
Copied from [place] [id number]. Topic: Inquiry >> Jan 12, 2018  9:43 AM Mylinda Latina, NT wrote: Reason for CRM: Patient called and states she has a question about her Prolia injection Patient states she has been off her Calcium medicine because of her stomach. The patient is wondering by hetr being off this medication will that interfere with her getting the injection. CB # [phone]  Please advise

## 2018-01-12 NOTE — Telephone Encounter (Signed)
Patient had problems with gerd and reflux after starting caltrate---another doctor she sees told her to stop taking caltrate---her symptoms have improved, but her next prolia is due at end of July---and wasn't sure if she should continue taking prolia without calcium supplement---she's not sure if its calcium part of caltrate that bothers her, but symptoms improved when she stopped taking it---what do you recommend for patient to do at this point----more labs to monitor calcium level?, or another calcium appropriate supplement to continue prolia?----please advise, I will call patient back, thanks

## 2018-01-12 NOTE — Telephone Encounter (Signed)
Have her try to take a different calcium supplementation - maybe a different type of calcium will not cause side effects - she can even try tums for the calcium and take a separate vitamin d

## 2018-01-15 NOTE — Telephone Encounter (Signed)
Patient came in today to check on this response.  Patient states she can not take tums and Caltrate.   Patient states she will consider taking tums again but would like to know how may she needs to take.  She also would like to know how much vit d she needs to take b/c she is already taking vit d.

## 2018-01-15 NOTE — Telephone Encounter (Signed)
The ideal amount of calcium a day is 1200 mg for the day - 600 mg twice daily.  She can get some of this from diet, but most should be from supplementation  She should take a total of 200 units of vitamin d daily

## 2018-01-18 NOTE — Telephone Encounter (Signed)
Spoke with pt to inform.  

## 2018-01-22 ENCOUNTER — Other Ambulatory Visit: Payer: Self-pay | Admitting: Internal Medicine

## 2018-02-02 ENCOUNTER — Ambulatory Visit (INDEPENDENT_AMBULATORY_CARE_PROVIDER_SITE_OTHER): Payer: Medicare Other | Admitting: *Deleted

## 2018-02-02 DIAGNOSIS — I495 Sick sinus syndrome: Secondary | ICD-10-CM | POA: Diagnosis not present

## 2018-02-02 NOTE — Progress Notes (Signed)
Remote pacemaker transmission.   

## 2018-02-17 ENCOUNTER — Ambulatory Visit (INDEPENDENT_AMBULATORY_CARE_PROVIDER_SITE_OTHER): Payer: Medicare Other | Admitting: *Deleted

## 2018-02-17 DIAGNOSIS — M810 Age-related osteoporosis without current pathological fracture: Secondary | ICD-10-CM

## 2018-02-17 MED ORDER — DENOSUMAB 60 MG/ML ~~LOC~~ SOSY
60.0000 mg | PREFILLED_SYRINGE | Freq: Once | SUBCUTANEOUS | Status: AC
  Administered 2018-02-17: 60 mg via SUBCUTANEOUS

## 2018-02-17 NOTE — Progress Notes (Signed)
prolia 

## 2018-02-27 LAB — CUP PACEART REMOTE DEVICE CHECK
Date Time Interrogation Session: 20190803124641
Implantable Lead Implant Date: 20171009
Implantable Lead Location: 753859
Implantable Lead Model: 5076
Implantable Lead Model: 7741
Implantable Lead Serial Number: 785430
Implantable Pulse Generator Implant Date: 20171009
MDC IDC LEAD IMPLANT DT: 20171009
MDC IDC LEAD LOCATION: 753860
Pulse Gen Serial Number: 766467

## 2018-04-26 ENCOUNTER — Other Ambulatory Visit: Payer: Self-pay | Admitting: Internal Medicine

## 2018-04-26 DIAGNOSIS — H04123 Dry eye syndrome of bilateral lacrimal glands: Secondary | ICD-10-CM | POA: Diagnosis not present

## 2018-04-30 DIAGNOSIS — C44519 Basal cell carcinoma of skin of other part of trunk: Secondary | ICD-10-CM | POA: Diagnosis not present

## 2018-04-30 DIAGNOSIS — X32XXXD Exposure to sunlight, subsequent encounter: Secondary | ICD-10-CM | POA: Diagnosis not present

## 2018-04-30 DIAGNOSIS — Z08 Encounter for follow-up examination after completed treatment for malignant neoplasm: Secondary | ICD-10-CM | POA: Diagnosis not present

## 2018-04-30 DIAGNOSIS — L570 Actinic keratosis: Secondary | ICD-10-CM | POA: Diagnosis not present

## 2018-04-30 DIAGNOSIS — D225 Melanocytic nevi of trunk: Secondary | ICD-10-CM | POA: Diagnosis not present

## 2018-04-30 DIAGNOSIS — D045 Carcinoma in situ of skin of trunk: Secondary | ICD-10-CM | POA: Diagnosis not present

## 2018-04-30 DIAGNOSIS — Z85828 Personal history of other malignant neoplasm of skin: Secondary | ICD-10-CM | POA: Diagnosis not present

## 2018-05-04 ENCOUNTER — Ambulatory Visit (INDEPENDENT_AMBULATORY_CARE_PROVIDER_SITE_OTHER): Payer: Medicare Other | Admitting: *Deleted

## 2018-05-04 DIAGNOSIS — I495 Sick sinus syndrome: Secondary | ICD-10-CM | POA: Diagnosis not present

## 2018-05-05 NOTE — Progress Notes (Signed)
Remote pacemaker transmission.   

## 2018-05-13 ENCOUNTER — Encounter: Payer: Self-pay | Admitting: Cardiology

## 2018-05-17 ENCOUNTER — Ambulatory Visit (INDEPENDENT_AMBULATORY_CARE_PROVIDER_SITE_OTHER): Payer: Medicare Other

## 2018-05-17 DIAGNOSIS — Z23 Encounter for immunization: Secondary | ICD-10-CM | POA: Diagnosis not present

## 2018-05-18 LAB — CUP PACEART REMOTE DEVICE CHECK
Implantable Lead Implant Date: 20171009
Implantable Lead Model: 7741
Implantable Lead Serial Number: 785430
MDC IDC LEAD IMPLANT DT: 20171009
MDC IDC LEAD LOCATION: 753859
MDC IDC LEAD LOCATION: 753860
MDC IDC PG IMPLANT DT: 20171009
MDC IDC PG SERIAL: 766467
MDC IDC SESS DTM: 20191022121359

## 2018-06-08 DIAGNOSIS — Z85828 Personal history of other malignant neoplasm of skin: Secondary | ICD-10-CM | POA: Diagnosis not present

## 2018-06-08 DIAGNOSIS — Z08 Encounter for follow-up examination after completed treatment for malignant neoplasm: Secondary | ICD-10-CM | POA: Diagnosis not present

## 2018-06-08 DIAGNOSIS — C44212 Basal cell carcinoma of skin of right ear and external auricular canal: Secondary | ICD-10-CM | POA: Diagnosis not present

## 2018-06-09 ENCOUNTER — Encounter: Payer: Self-pay | Admitting: Internal Medicine

## 2018-06-18 ENCOUNTER — Ambulatory Visit: Payer: Medicare Other | Admitting: Internal Medicine

## 2018-06-27 NOTE — Progress Notes (Signed)
Subjective:    Patient ID: [name], female    DOB: 03/19/1935, 83 y.o.   MRN: [id number]  HPI The patient is here for follow up.  Prediabetes:  She is compliant with a low sugar/carbohydrate diet.  She is exercising minimally.  GERD, dysphagia:  She is taking her medication daily as prescribed.  She denies any GERD symptoms and feels her GERD is well controlled.   Osteoporosis:  She is getting prolia every 6 months  She is not taking calcium but is taking vitamin D.  She is exercising - weights, walks 10-15 min on the treadmill - she has not been doing as much recently because they just moved and is still getting settled..    Neuralgia:  She is taking amitriptyline 10 mg as needed.    She was on 20 mg but it caused hallucinations and she stopped it for a while and is taking it as needed.  It does help with her back pain.    SOB:  She still has SOB with exertion.  She wonders if it could be a little anxiety.   She has had a stress test in 07/2017 and it was negative for ischemia.  She saw pulmonary and there was no pulmonary cause.  She does some exercise, but has not been exercise much recently.    Medications and allergies reviewed with patient and updated if appropriate.  Patient Active Problem List   Diagnosis Date Noted  . Lumbar spinal stenosis 01/10/2018  . Osteoarthritis of both knees 12/15/2017  . Epigastric pain 09/09/2017  . Neuralgia 09/09/2017  . Claudication of calf muscles ([place]) 11/22/2016  . DOE (dyspnea on exertion) 11/22/2016  . Diastolic dysfunction 30/16/0109  . Hyperreflexia of lower extremity 07/16/2016  . Bilateral leg paresthesia 07/16/2016  . Carotid arterial disease ([place]) 07/13/2016  . Undiagnosed cardiac murmurs 06/23/2016  . Poor balance 06/23/2016  . Weakness of both lower extremities 06/23/2016  . Sinus node dysfunction ([place]) 05/05/2016  . Right shoulder pain 03/14/2016  . Multinodular goiter (nontoxic) 02/21/2016  . Neck pain 02/15/2016    . Pain in limb 02/15/2016  . Neck nodule 02/15/2016  . Prediabetes 12/17/2015  . Dysphagia 12/17/2015  . Anxiety 11/13/2015  . T8 vertebral fracture ([place]) 04/09/2015  . Overweight 10/09/2011  . Allergic rhinitis, cause unspecified   . GERD (gastroesophageal reflux disease) 02/05/2011  . FATIGUE 09/28/2009  . Vitamin B 12 deficiency 04/17/2009  . Hyperlipidemia 04/17/2009  . Osteoporosis 04/17/2009  . ADENOCARCINOMA, BREAST, HX OF 04/17/2009  . CONSTIPATION 03/08/2009  . COLONIC POLYPS, HYPERPLASTIC, HX OF 03/06/2009    Current Outpatient Medications on File Prior to Visit  Medication Sig Dispense Refill  . acetaminophen (TYLENOL) 500 MG tablet Take 500 mg by mouth 2 (two) times daily.    [name] amitriptyline (ELAVIL) 10 MG tablet Take 2 tablets (20 mg total) by mouth at bedtime. (Patient taking differently: Take 10 mg by mouth at bedtime. ) 60 tablet 3  . aspirin 81 MG tablet Take 81 mg by mouth daily.    . Calcium Carbonate-Vit D-Min (CALTRATE 600+D PLUS MINERALS) 600-800 MG-UNIT CHEW Chew 3 tablets by mouth 1 day or 1 dose.     . cholecalciferol (VITAMIN D) 1000 UNITS tablet Take 2,000 Units by mouth daily.     . Cyanocobalamin (RA VITAMIN B-12 TR) 1000 MCG TBCR Take 1 tablet by mouth every other day.     . fluticasone (FLONASE) 50 MCG/ACT nasal spray Place 1 spray into  both nostrils daily as needed for allergies.     . furosemide (LASIX) 40 MG tablet TAKE 1 TABLET ONCE DAILY. 90 tablet 0  . loratadine (CLARITIN) 10 MG tablet Take 10 mg by mouth daily as needed for allergies.    [name] omeprazole (PRILOSEC) 40 MG capsule Take 1 capsule (40 mg total) by mouth 2 (two) times daily. 60 capsule 11  . polyethylene glycol (MIRALAX / GLYCOLAX) packet Take 17 g by mouth daily as needed (constipation).     . potassium chloride SA (K-DUR,KLOR-CON) 20 MEQ tablet TAKE 1 TABLET ONCE DAILY. 90 tablet 0   No current facility-administered medications on file prior to visit.     Past Medical History:   Diagnosis Date  . Cancer of left breast ([place]) 1978  . Chronic thoracic back pain    "T8; fracture; 03/2015; no OR" (05/05/2016)  . GERD (gastroesophageal reflux disease)   . Heart murmur   . History of hiatal hernia   . Hyperlipidemia   . Hyperplastic colon polyp   . IBS (irritable bowel syndrome)   . Multiple thyroid nodules   . Osteoporosis    T8 compression fx 03/2015   . Personal history of radiation therapy   . Presence of permanent cardiac pacemaker   . Vitamin B12 deficiency     Past Surgical History:  Procedure Laterality Date  . ANTERIOR CERVICAL DECOMP/DISCECTOMY FUSION  2008   C5-6  . AUGMENTATION MAMMAPLASTY    . BACK SURGERY    . BREAST SURGERY    . CARDIAC CATHETERIZATION  2001  . EP IMPLANTABLE DEVICE N/A 05/05/2016   Procedure: Pacemaker Implant;  Surgeon: Evans Lance, MD;  Location: [facility];  Service: Cardiovascular;  Laterality: N/A;  . EXCISIONAL HEMORRHOIDECTOMY  1984   With subsequent correction of surgery  . INSERT / REPLACE / REMOVE PACEMAKER    . KNEE ARTHROSCOPY Left    "meniscus tear"  . MASTECTOMY Left 1978  . PLACEMENT OF BREAST IMPLANTS Left 1981  . SHOULDER ARTHROSCOPY W/ ROTATOR CUFF REPAIR Left 02/2006   "tear"  . TUBAL LIGATION      Social History   Socioeconomic History  . Marital status: Married    Spouse name: Not on file  . Number of children: 2  . Years of education: Not on file  . Highest education level: Not on file  Occupational History  . Not on file  Social Needs  . Financial resource strain: Not hard at all  . Food insecurity:    Worry: Never true    Inability: Never true  . Transportation needs:    Medical: No    Non-medical: No  Tobacco Use  . Smoking status: Never Smoker  . Smokeless tobacco: Never Used  Substance and Sexual Activity  . Alcohol use: No    Alcohol/week: 0.0 standard drinks  . Drug use: No  . Sexual activity: Not Currently  Lifestyle  . Physical activity:    Days per week: 3  days    Minutes per session: 50 min  . Stress: Only a little  Relationships  . Social connections:    Talks on phone: More than three times a week    Gets together: More than three times a week    Attends religious service: More than 4 times per year    Active member of club or organization: Yes    Attends meetings of clubs or organizations: More than 4 times per year    Relationship status:  Married  Other Topics Concern  . Not on file  Social History Narrative   Housewife, Lives with spouse, 2 children    Family History  Problem Relation Age of Onset  . Diabetes Sister   . Diabetes Brother   . Breast cancer Maternal Aunt   . Colon cancer Neg Hx     Review of Systems  Constitutional: Negative for chills and fever.  HENT: Positive for trouble swallowing.   Respiratory: Positive for shortness of breath (with exertion only). Negative for cough and wheezing.   Cardiovascular: Negative for chest pain, palpitations and leg swelling.  Neurological: Negative for light-headedness and headaches.       Objective:   Vitals:   06/29/18 0840  BP: (!) 146/70  Pulse: 68  Resp: 16  Temp: 98.4 F (36.9 C)  SpO2: 98%   BP Readings from Last 3 Encounters:  06/29/18 (!) 146/70  12/17/17 118/62  12/15/17 130/64   Wt Readings from Last 3 Encounters:  06/29/18 159 lb 6.4 oz (72.3 kg)  12/17/17 162 lb (73.5 kg)  12/15/17 163 lb (73.9 kg)   Body mass index is 30.12 kg/m.   Physical Exam    Constitutional: Appears well-developed and well-nourished. No distress.  HENT:  Head: Normocephalic and atraumatic.  Neck: Neck supple. No tracheal deviation present. No thyromegaly present.  No cervical lymphadenopathy Cardiovascular: Normal rate, regular rhythm and normal heart sounds.   1/6 systolic murmur heard. No carotid bruit .  No edema Pulmonary/Chest: Effort normal and breath sounds normal. No respiratory distress. No has no wheezes. No rales.  Skin: Skin is warm and dry. Not  diaphoretic.  Psychiatric: Normal mood and affect. Behavior is normal.      Assessment & Plan:    See Problem List for Assessment and Plan of chronic medical problems.

## 2018-06-29 ENCOUNTER — Encounter: Payer: Self-pay | Admitting: Internal Medicine

## 2018-06-29 ENCOUNTER — Other Ambulatory Visit (INDEPENDENT_AMBULATORY_CARE_PROVIDER_SITE_OTHER): Payer: Medicare Other

## 2018-06-29 ENCOUNTER — Ambulatory Visit (INDEPENDENT_AMBULATORY_CARE_PROVIDER_SITE_OTHER): Payer: Medicare Other | Admitting: Internal Medicine

## 2018-06-29 VITALS — BP 146/70 | HR 68 | Temp 98.4°F | Resp 16 | Ht 61.0 in | Wt 159.4 lb

## 2018-06-29 DIAGNOSIS — K219 Gastro-esophageal reflux disease without esophagitis: Secondary | ICD-10-CM

## 2018-06-29 DIAGNOSIS — M792 Neuralgia and neuritis, unspecified: Secondary | ICD-10-CM | POA: Diagnosis not present

## 2018-06-29 DIAGNOSIS — R7303 Prediabetes: Secondary | ICD-10-CM

## 2018-06-29 DIAGNOSIS — M810 Age-related osteoporosis without current pathological fracture: Secondary | ICD-10-CM

## 2018-06-29 DIAGNOSIS — I200 Unstable angina: Secondary | ICD-10-CM

## 2018-06-29 LAB — COMPREHENSIVE METABOLIC PANEL
ALT: 10 U/L (ref 0–35)
AST: 10 U/L (ref 0–37)
Albumin: 4.1 g/dL (ref 3.5–5.2)
Alkaline Phosphatase: 36 U/L — ABNORMAL LOW (ref 39–117)
BUN: 21 mg/dL (ref 6–23)
CHLORIDE: 106 meq/L (ref 96–112)
CO2: 24 meq/L (ref 19–32)
CREATININE: 1.16 mg/dL (ref 0.40–1.20)
Calcium: 8.3 mg/dL — ABNORMAL LOW (ref 8.4–10.5)
GFR: 47.63 mL/min — ABNORMAL LOW (ref >60.00)
Glucose, Bld: 102 mg/dL — ABNORMAL HIGH (ref 70–99)
Potassium: 4.8 mEq/L (ref 3.5–5.1)
SODIUM: 138 meq/L (ref 135–145)
Total Bilirubin: 0.3 mg/dL (ref 0.2–1.2)
Total Protein: 6.9 g/dL (ref 6.0–8.3)

## 2018-06-29 LAB — HEMOGLOBIN A1C: Hgb A1c MFr Bld: 6.2 % (ref 4.6–6.5)

## 2018-06-29 MED ORDER — AMITRIPTYLINE HCL 10 MG PO TABS: 10.0000 mg | ORAL_TABLET | Freq: Every day | ORAL | 5 refills | Status: DC

## 2018-06-29 NOTE — Assessment & Plan Note (Signed)
Check a1c Low sugar / carb diet Stressed regular exercise   

## 2018-06-29 NOTE — Assessment & Plan Note (Signed)
GERD controlled Continue daily medication Can replace zantac with pepcid

## 2018-06-29 NOTE — Assessment & Plan Note (Addendum)
Taking prolia Q 6 months Taking  vitamin d Not taking calcium-cause stomach upset Advised increasing calcium in diet and will need to monitor Encouraged regular exercise DEXA due-ordered

## 2018-06-29 NOTE — Assessment & Plan Note (Signed)
Has not tolerated amitriptyline 20 mg nightly-she was having some hallucinations Currently only taking 10 mg as needed, but will try 10 mg on a nightly basis because it does help with her back and she has not tolerated several other medications

## 2018-06-29 NOTE — Patient Instructions (Addendum)
  Tests ordered today. Your results will be released to MyChart (or called to you) after review, usually within 72hours after test completion. If any changes need to be made, you will be notified at that same time.  Medications reviewed and updated.  Changes include :   none      Please followup in 6 months   

## 2018-07-06 ENCOUNTER — Ambulatory Visit (INDEPENDENT_AMBULATORY_CARE_PROVIDER_SITE_OTHER)
Admission: RE | Admit: 2018-07-06 | Discharge: 2018-07-06 | Disposition: A | Discharge status: Home / Self Care | Payer: Medicare Other | Source: Ambulatory Visit | Attending: Internal Medicine | Admitting: Internal Medicine

## 2018-07-06 DIAGNOSIS — M810 Age-related osteoporosis without current pathological fracture: Secondary | ICD-10-CM | POA: Diagnosis not present

## 2018-07-23 ENCOUNTER — Other Ambulatory Visit: Payer: Self-pay | Admitting: Internal Medicine

## 2018-07-26 ENCOUNTER — Encounter: Payer: Self-pay | Admitting: Internal Medicine

## 2018-07-26 ENCOUNTER — Ambulatory Visit (INDEPENDENT_AMBULATORY_CARE_PROVIDER_SITE_OTHER): Payer: Medicare Other | Admitting: Internal Medicine

## 2018-07-26 VITALS — BP 112/60 | HR 60 | Ht 61.0 in | Wt 156.4 lb

## 2018-07-26 DIAGNOSIS — I495 Sick sinus syndrome: Secondary | ICD-10-CM | POA: Diagnosis not present

## 2018-07-26 DIAGNOSIS — Z950 Presence of cardiac pacemaker: Secondary | ICD-10-CM | POA: Diagnosis not present

## 2018-07-26 DIAGNOSIS — I200 Unstable angina: Secondary | ICD-10-CM

## 2018-07-26 LAB — CUP PACEART INCLINIC DEVICE CHECK
Implantable Lead Implant Date: 20171009
Implantable Lead Implant Date: 20171009
Implantable Lead Model: 5076
Implantable Lead Serial Number: 785430
Implantable Pulse Generator Implant Date: 20171009
Lead Channel Impedance Value: 442 Ohm
Lead Channel Pacing Threshold Amplitude: 0.5 V
Lead Channel Pacing Threshold Amplitude: 0.9 V
Lead Channel Pacing Threshold Pulse Width: 1 ms
Lead Channel Sensing Intrinsic Amplitude: 1.4 mV
Lead Channel Setting Pacing Amplitude: 2.5 V
Lead Channel Setting Pacing Pulse Width: 0.4 ms
Lead Channel Setting Sensing Sensitivity: 2.5 mV
MDC IDC LEAD LOCATION: 753859
MDC IDC LEAD LOCATION: 753860
MDC IDC MSMT LEADCHNL RV IMPEDANCE VALUE: 577 Ohm
MDC IDC MSMT LEADCHNL RV PACING THRESHOLD PULSEWIDTH: 0.4 ms
MDC IDC MSMT LEADCHNL RV SENSING INTR AMPL: 8 mV
MDC IDC SESS DTM: 20191230050000
MDC IDC SET LEADCHNL RA PACING AMPLITUDE: 2 V
Pulse Gen Serial Number: 766467

## 2018-07-26 NOTE — Patient Instructions (Signed)
Medication Instructions:  Your physician recommends that you continue on your current medications as directed. Please refer to the Current Medication list given to you today.  Labwork: None ordered.  Testing/Procedures: None ordered.  Follow-Up: Your physician wants you to follow-up in: one year with Dr. Lovena Le.   You will receive a reminder letter in the mail two months in advance. If you don't receive a letter, please call our office to schedule the follow-up appointment.  Remote monitoring is used to monitor your Pacemaker from home. This monitoring reduces the number of office visits required to check your device to one time per year. It allows [place] to keep an eye on the functioning of your device to ensure it is working properly. You are scheduled for a /device check from home on 08/03/2018. You may send your transmission at any time that day. If you have a wireless device, the transmission will be sent automatically. After your physician reviews your transmission, you will receive a postcard with your next transmission date.  Any Other Special Instructions Will Be Listed Below (If Applicable).  If you need a refill on your cardiac medications before your next appointment, please call your pharmacy.

## 2018-07-26 NOTE — Progress Notes (Signed)
HPI [name] returns today for followup of her PPM. She is a pleasant 83 yo woman with HTN, sinus node dysfunction, peripheral vascular disease and is s/p PPM insertion over 2 years ago.  Allergies  Allergen Reactions  . Lipitor [Atorvastatin]     Lipitor caused hospitalization - depletion of electrolytes, fever, nausea, loss of appetite, couldn't get out of bed  . Statins     Lipitor caused hospitalization - - depletion of electrolytes, fever, nausea, loss of appetite, couldn't get out of bed  . Cymbalta [Duloxetine Hcl]     Dulled her too much, difficulty urinating, change in vision  . Fosamax [Alendronate Sodium]     Caused chronic issues swallowing  . Lyrica [Pregabalin]     Unknown  . Neurontin [Gabapentin] Other (See Comments)    Dizziness and sedation  . Zanaflex [Tizanidine] Other (See Comments)    Could not sleep     Current Outpatient Medications  Medication Sig Dispense Refill  . acetaminophen (TYLENOL) 500 MG tablet Take 500 mg by mouth 2 (two) times daily.    [name] amitriptyline (ELAVIL) 10 MG tablet Take 1 tablet (10 mg total) by mouth at bedtime. 30 tablet 5  . aspirin 81 MG tablet Take 81 mg by mouth daily.    . cholecalciferol (VITAMIN D) 1000 UNITS tablet Take 2,000 Units by mouth daily.     . Cyanocobalamin (RA VITAMIN B-12 TR) 1000 MCG TBCR Take 1 tablet by mouth every other day.     . fluticasone (FLONASE) 50 MCG/ACT nasal spray Place 1 spray into both nostrils daily as needed for allergies.     . furosemide (LASIX) 40 MG tablet Take 1 tablet (40 mg total) by mouth daily. Please keep upcoming appt for future refills. Thank you. 90 tablet 0  . loratadine (CLARITIN) 10 MG tablet Take 10 mg by mouth daily as needed for allergies.    [name] omeprazole (PRILOSEC) 40 MG capsule Take 1 capsule (40 mg total) by mouth 2 (two) times daily. 60 capsule 11  . polyethylene glycol (MIRALAX / GLYCOLAX) packet Take 17 g by mouth daily as needed (constipation).     . potassium  chloride SA (K-DUR,KLOR-CON) 20 MEQ tablet Take 1 tablet (20 mEq total) by mouth daily. Please keep upcoming appt for future refills. Thank you. 90 tablet 0   No current facility-administered medications for this visit.      Past Medical History:  Diagnosis Date  . Cancer of left breast ([place]) 1978  . Chronic thoracic back pain    "T8; fracture; 03/2015; no OR" (05/05/2016)  . GERD (gastroesophageal reflux disease)   . Heart murmur   . History of hiatal hernia   . Hyperlipidemia   . Hyperplastic colon polyp   . IBS (irritable bowel syndrome)   . Multiple thyroid nodules   . Osteoporosis    T8 compression fx 03/2015   . Personal history of radiation therapy   . Presence of permanent cardiac pacemaker   . Vitamin B12 deficiency     ROS:   All systems reviewed and negative except as noted in the HPI.   Past Surgical History:  Procedure Laterality Date  . ANTERIOR CERVICAL DECOMP/DISCECTOMY FUSION  2008   C5-6  . AUGMENTATION MAMMAPLASTY    . BACK SURGERY    . BREAST SURGERY    . CARDIAC CATHETERIZATION  2001  . EP IMPLANTABLE DEVICE N/A 05/05/2016   Procedure: Pacemaker Implant;  Surgeon: Evans Lance, MD;  Location: [facility];  Service: Cardiovascular;  Laterality: N/A;  . EXCISIONAL HEMORRHOIDECTOMY  1984   With subsequent correction of surgery  . INSERT / REPLACE / REMOVE PACEMAKER    . KNEE ARTHROSCOPY Left    "meniscus tear"  . MASTECTOMY Left 1978  . PLACEMENT OF BREAST IMPLANTS Left 1981  . SHOULDER ARTHROSCOPY W/ ROTATOR CUFF REPAIR Left 02/2006   "tear"  . TUBAL LIGATION       Family History  Problem Relation Age of Onset  . Diabetes Sister   . Diabetes Brother   . Breast cancer Maternal Aunt   . Colon cancer Neg Hx      Social History   Socioeconomic History  . Marital status: Married    Spouse name: Not on file  . Number of children: 2  . Years of education: Not on file  . Highest education level: Not on file  Occupational History    . Not on file  Social Needs  . Financial resource strain: Not hard at all  . Food insecurity:    Worry: Never true    Inability: Never true  . Transportation needs:    Medical: No    Non-medical: No  Tobacco Use  . Smoking status: Never Smoker  . Smokeless tobacco: Never Used  Substance and Sexual Activity  . Alcohol use: No    Alcohol/week: 0.0 standard drinks  . Drug use: No  . Sexual activity: Not Currently  Lifestyle  . Physical activity:    Days per week: 3 days    Minutes per session: 50 min  . Stress: Only a little  Relationships  . Social connections:    Talks on phone: More than three times a week    Gets together: More than three times a week    Attends religious service: More than 4 times per year    Active member of club or organization: Yes    Attends meetings of clubs or organizations: More than 4 times per year    Relationship status: Married  . Intimate partner violence:    Fear of current or ex partner: No    Emotionally abused: No    Physically abused: No    Forced sexual activity: No  Other Topics Concern  . Not on file  Social History Narrative   Housewife, Lives with spouse, 2 children     There were no vitals taken for this visit.  Physical Exam:  Well appearing NAD HEENT: Unremarkable Neck:  No JVD, no thyromegally Lymphatics:  No adenopathy Back:  No CVA tenderness Lungs:  Clear HEART:  Regular rate rhythm, no murmurs, no rubs, no clicks Abd:  soft, positive bowel sounds, no organomegally, no rebound, no guarding Ext:  2 plus pulses, no edema, no cyanosis, no clubbing Skin:  No rashes no nodules Neuro:  CN II through XII intact, motor grossly intact  EKG - nsr with atrial pacing  DEVICE  Normal device function.  See PaceArt for details.   Assess/Plan: 1. Sinus node dysfunction - she is maintaining NSR. 2. PPM - her [employer] DDD PM is working normally.  3. HTN - her blood pressure is very good today. 4. Diastolic heart  failure - dyspnea is her main complaint. We discussed a low salt diet and regular daily aerobic exercise. She is encouraged for both.  Mikle [email or web address].D.

## 2018-07-26 NOTE — Addendum Note (Signed)
Addended by: Rose Phi on: 07/26/2018 03:39 PM   Modules accepted: Orders

## 2018-07-30 DIAGNOSIS — Z08 Encounter for follow-up examination after completed treatment for malignant neoplasm: Secondary | ICD-10-CM | POA: Diagnosis not present

## 2018-07-30 DIAGNOSIS — Z85828 Personal history of other malignant neoplasm of skin: Secondary | ICD-10-CM | POA: Diagnosis not present

## 2018-08-03 ENCOUNTER — Ambulatory Visit (INDEPENDENT_AMBULATORY_CARE_PROVIDER_SITE_OTHER): Payer: Medicare Other

## 2018-08-03 ENCOUNTER — Telehealth: Payer: Self-pay | Admitting: Gastroenterology

## 2018-08-03 DIAGNOSIS — I495 Sick sinus syndrome: Secondary | ICD-10-CM | POA: Diagnosis not present

## 2018-08-03 MED ORDER — OMEPRAZOLE 40 MG PO CPDR
40.0000 mg | DELAYED_RELEASE_CAPSULE | Freq: Two times a day (BID) | ORAL | 0 refills | Status: DC
Start: 2018-08-03 — End: 2018-10-13

## 2018-08-03 NOTE — Telephone Encounter (Signed)
Patient requesting authorization to refill medication omeprazole.

## 2018-08-03 NOTE — Telephone Encounter (Signed)
Patient states her omeprazole requires a PA. Informed patient I have not received anything from her pharmacy stating that their was a rejection. Informed patient I will send the prescription to [facility] and call her if it requires a PA. Patient verbalized understanding.

## 2018-08-04 NOTE — Progress Notes (Signed)
Remote pacemaker transmission.   

## 2018-08-05 LAB — CUP PACEART REMOTE DEVICE CHECK
Date Time Interrogation Session: 20200109064646
Implantable Lead Implant Date: 20171009
Implantable Lead Location: 753860
Implantable Lead Serial Number: 785430
MDC IDC LEAD IMPLANT DT: 20171009
MDC IDC LEAD LOCATION: 753859
MDC IDC PG IMPLANT DT: 20171009
MDC IDC PG SERIAL: 766467

## 2018-08-27 DIAGNOSIS — Z85828 Personal history of other malignant neoplasm of skin: Secondary | ICD-10-CM | POA: Diagnosis not present

## 2018-08-27 DIAGNOSIS — Z08 Encounter for follow-up examination after completed treatment for malignant neoplasm: Secondary | ICD-10-CM | POA: Diagnosis not present

## 2018-08-27 DIAGNOSIS — C44519 Basal cell carcinoma of skin of other part of trunk: Secondary | ICD-10-CM | POA: Diagnosis not present

## 2018-09-08 ENCOUNTER — Ambulatory Visit (INDEPENDENT_AMBULATORY_CARE_PROVIDER_SITE_OTHER): Payer: Medicare Other

## 2018-09-08 DIAGNOSIS — M810 Age-related osteoporosis without current pathological fracture: Secondary | ICD-10-CM

## 2018-09-08 MED ORDER — DENOSUMAB 60 MG/ML ~~LOC~~ SOSY
60.0000 mg | PREFILLED_SYRINGE | Freq: Once | SUBCUTANEOUS | Status: AC
  Administered 2018-09-08: 60 mg via SUBCUTANEOUS

## 2018-09-08 NOTE — Progress Notes (Signed)
prolia Injection given.   Barlow Harrison J Ebbie Cherry, MD  

## 2018-10-08 DIAGNOSIS — L570 Actinic keratosis: Secondary | ICD-10-CM | POA: Diagnosis not present

## 2018-10-08 DIAGNOSIS — X32XXXD Exposure to sunlight, subsequent encounter: Secondary | ICD-10-CM | POA: Diagnosis not present

## 2018-10-08 DIAGNOSIS — Z85828 Personal history of other malignant neoplasm of skin: Secondary | ICD-10-CM | POA: Diagnosis not present

## 2018-10-08 DIAGNOSIS — Z08 Encounter for follow-up examination after completed treatment for malignant neoplasm: Secondary | ICD-10-CM | POA: Diagnosis not present

## 2018-10-13 ENCOUNTER — Other Ambulatory Visit: Payer: Self-pay | Admitting: Gastroenterology

## 2018-10-25 ENCOUNTER — Other Ambulatory Visit: Payer: Self-pay | Admitting: Internal Medicine

## 2018-11-02 ENCOUNTER — Ambulatory Visit (INDEPENDENT_AMBULATORY_CARE_PROVIDER_SITE_OTHER): Payer: Medicare Other | Admitting: *Deleted

## 2018-11-02 ENCOUNTER — Other Ambulatory Visit: Payer: Self-pay

## 2018-11-02 DIAGNOSIS — I495 Sick sinus syndrome: Secondary | ICD-10-CM

## 2018-11-04 LAB — CUP PACEART REMOTE DEVICE CHECK
Battery Remaining Longevity: 144 mo
Brady Statistic RA Percent Paced: 36 %
Brady Statistic RV Percent Paced: 0 %
Date Time Interrogation Session: 20200409123643
Implantable Lead Implant Date: 20171009
Implantable Lead Implant Date: 20171009
Implantable Lead Location: 753859
Implantable Lead Location: 753860
Implantable Lead Model: 5076
Implantable Lead Model: 7741
Implantable Lead Serial Number: 785430
Implantable Pulse Generator Implant Date: 20171009
Lead Channel Impedance Value: 443 Ohm
Lead Channel Impedance Value: 621 Ohm
Lead Channel Pacing Threshold Amplitude: 1 V
Lead Channel Pacing Threshold Pulse Width: 0.4 ms
Lead Channel Sensing Intrinsic Amplitude: 2.6 mV
Lead Channel Sensing Intrinsic Amplitude: 8.6 mV
Lead Channel Setting Pacing Amplitude: 2 V
Lead Channel Setting Pacing Amplitude: 2.5 V
Lead Channel Setting Pacing Pulse Width: 0.4 ms
Lead Channel Setting Sensing Sensitivity: 2.5 mV
Pulse Gen Serial Number: 766467

## 2018-11-11 ENCOUNTER — Encounter: Payer: Self-pay | Admitting: Cardiology

## 2018-11-11 NOTE — Progress Notes (Signed)
Remote pacemaker transmission.   

## 2018-11-24 ENCOUNTER — Other Ambulatory Visit: Payer: Self-pay | Admitting: Gastroenterology

## 2018-12-01 ENCOUNTER — Other Ambulatory Visit: Payer: Self-pay | Admitting: Internal Medicine

## 2018-12-01 DIAGNOSIS — Z1231 Encounter for screening mammogram for malignant neoplasm of breast: Secondary | ICD-10-CM

## 2018-12-08 DIAGNOSIS — L578 Other skin changes due to chronic exposure to nonionizing radiation: Secondary | ICD-10-CM | POA: Diagnosis not present

## 2018-12-27 ENCOUNTER — Other Ambulatory Visit: Payer: Self-pay | Admitting: Gastroenterology

## 2018-12-30 ENCOUNTER — Ambulatory Visit: Payer: Medicare Other | Admitting: Internal Medicine

## 2019-01-26 ENCOUNTER — Other Ambulatory Visit: Payer: Self-pay

## 2019-01-26 ENCOUNTER — Ambulatory Visit
Admission: RE | Admit: 2019-01-26 | Discharge: 2019-01-26 | Disposition: A | Discharge status: Home / Self Care | Payer: Medicare Other | Source: Ambulatory Visit | Attending: Internal Medicine | Admitting: Internal Medicine

## 2019-01-26 DIAGNOSIS — Z1231 Encounter for screening mammogram for malignant neoplasm of breast: Secondary | ICD-10-CM

## 2019-01-28 ENCOUNTER — Other Ambulatory Visit: Payer: Self-pay | Admitting: Gastroenterology

## 2019-01-30 DIAGNOSIS — N189 Chronic kidney disease, unspecified: Secondary | ICD-10-CM | POA: Insufficient documentation

## 2019-01-30 NOTE — Progress Notes (Signed)
Subjective:    Patient ID: [name], female    DOB: [birth year], 84 y.o.   MRN: [id number]  HPI The patient is here for follow up.  She is exercising - walking in the hallways.     Diastolic heart failure:  She is taking her lasix daily.  She is compliant with a low sodium diet.   CKD:  She is drinking a lot of water during the day.  She does not take any nsaids.   Prediabetes:  She is compliant with a low sugar/carbohydrate diet.  She is exercising regularly.  GERD:  She is taking her medication daily as prescribed.  She denies any GERD symptoms and feels her GERD is well controlled.   Osteoporosis:  She gets prolia Q 6 months- her last injection was 08/2018.  She is taking vitamin d daily.  She is exercising.   Neuralgia:  She no longer takes amitrptyline nightly - the 20 mg caused hallucinations.     She wonders if she should try the 10 mg again because that did help a little she thinks.   SOB:  She has seen cardiology and pulmonary and there was no cause identified for her SOB.  She has it with exertion.    Chronic neck and lower back pain, h/o lumbar compression fx, spinal stenosis:  She has pain with standing long periods.  She has pain with walking.      Medications and allergies reviewed with patient and updated if appropriate.  Patient Active Problem List   Diagnosis Date Noted  . CKD (chronic kidney disease) 01/30/2019  . Lumbar spinal stenosis 01/10/2018  . Osteoarthritis of both knees 12/15/2017  . Epigastric pain 09/09/2017  . Neuralgia 09/09/2017  . Claudication of calf muscles ([place]) 11/22/2016  . DOE (dyspnea on exertion) 11/22/2016  . Diastolic dysfunction 87/68/1157  . Hyperreflexia of lower extremity 07/16/2016  . Bilateral leg paresthesia 07/16/2016  . Carotid arterial disease ([place]) 07/13/2016  . Undiagnosed cardiac murmurs 06/23/2016  . Poor balance 06/23/2016  . Weakness of both lower extremities 06/23/2016  . Sinus node dysfunction ([place])  05/05/2016  . Right shoulder pain 03/14/2016  . Multinodular goiter (nontoxic) 02/21/2016  . Neck pain 02/15/2016  . Pain in limb 02/15/2016  . Prediabetes 12/17/2015  . Dysphagia 12/17/2015  . Anxiety 11/13/2015  . T8 vertebral fracture ([place]) 04/09/2015  . Overweight 10/09/2011  . Allergic rhinitis, cause unspecified   . GERD (gastroesophageal reflux disease) 02/05/2011  . Vitamin B 12 deficiency 04/17/2009  . Hyperlipidemia 04/17/2009  . Osteoporosis 04/17/2009  . ADENOCARCINOMA, BREAST, HX OF 04/17/2009  . CONSTIPATION 03/08/2009  . COLONIC POLYPS, HYPERPLASTIC, HX OF 03/06/2009    Current Outpatient Medications on File Prior to Visit  Medication Sig Dispense Refill  . acetaminophen (TYLENOL) 500 MG tablet Take 500 mg by mouth 2 (two) times daily.    [name] aspirin 81 MG tablet Take 81 mg by mouth daily.    . cholecalciferol (VITAMIN D) 1000 UNITS tablet Take 2,000 Units by mouth daily.     . Cyanocobalamin (RA VITAMIN B-12 TR) 1000 MCG TBCR Take 1 tablet by mouth every other day.     . fluticasone (FLONASE) 50 MCG/ACT nasal spray Place 1 spray into both nostrils daily as needed for allergies.     . furosemide (LASIX) 40 MG tablet TAKE 1 TABLET ONCE DAILY. 90 tablet 1  . loratadine (CLARITIN) 10 MG tablet Take 10 mg by mouth daily as needed for allergies.    [name]  omeprazole (PRILOSEC) 40 MG capsule TAKE (1) CAPSULE TWICE DAILY. 60 capsule 0  . polyethylene glycol (MIRALAX / GLYCOLAX) packet Take 17 g by mouth daily as needed (constipation).     . potassium chloride SA (K-DUR,KLOR-CON) 20 MEQ tablet TAKE 1 TABLET ONCE DAILY. 90 tablet 1   No current facility-administered medications on file prior to visit.     Past Medical History:  Diagnosis Date  . Cancer of left breast ([place]) 1978  . Chronic thoracic back pain    "T8; fracture; 03/2015; no OR" (05/05/2016)  . GERD (gastroesophageal reflux disease)   . Heart murmur   . History of hiatal hernia   . Hyperlipidemia   . Hyperplastic  colon polyp   . IBS (irritable bowel syndrome)   . Multiple thyroid nodules   . Osteoporosis    T8 compression fx 03/2015   . Personal history of radiation therapy   . Presence of permanent cardiac pacemaker   . Vitamin B12 deficiency     Past Surgical History:  Procedure Laterality Date  . ANTERIOR CERVICAL DECOMP/DISCECTOMY FUSION  2008   C5-6  . AUGMENTATION MAMMAPLASTY    . BACK SURGERY    . BREAST SURGERY    . CARDIAC CATHETERIZATION  2001  . EP IMPLANTABLE DEVICE N/A 05/05/2016   Procedure: Pacemaker Implant;  Surgeon: Evans Lance, MD;  Location: [facility];  Service: Cardiovascular;  Laterality: N/A;  . EXCISIONAL HEMORRHOIDECTOMY  1984   With subsequent correction of surgery  . INSERT / REPLACE / REMOVE PACEMAKER    . KNEE ARTHROSCOPY Left    "meniscus tear"  . MASTECTOMY Left 1978  . PLACEMENT OF BREAST IMPLANTS Left 1981  . SHOULDER ARTHROSCOPY W/ ROTATOR CUFF REPAIR Left 02/2006   "tear"  . TUBAL LIGATION      Social History   Socioeconomic History  . Marital status: Married    Spouse name: Not on file  . Number of children: 2  . Years of education: Not on file  . Highest education level: Not on file  Occupational History  . Not on file  Social Needs  . Financial resource strain: Not hard at all  . Food insecurity    Worry: Never true    Inability: Never true  . Transportation needs    Medical: No    Non-medical: No  Tobacco Use  . Smoking status: Never Smoker  . Smokeless tobacco: Never Used  Substance and Sexual Activity  . Alcohol use: No    Alcohol/week: 0.0 standard drinks  . Drug use: No  . Sexual activity: Not Currently  Lifestyle  . Physical activity    Days per week: 3 days    Minutes per session: 50 min  . Stress: Only a little  Relationships  . Social connections    Talks on phone: More than three times a week    Gets together: More than three times a week    Attends religious service: More than 4 times per year     Active member of club or organization: Yes    Attends meetings of clubs or organizations: More than 4 times per year    Relationship status: Married  Other Topics Concern  . Not on file  Social History Narrative   Housewife, Lives with spouse, 2 children    Family History  Problem Relation Age of Onset  . Diabetes Sister   . Diabetes Brother   . Breast cancer Maternal Aunt   . Colon  cancer Neg Hx     Review of Systems  Constitutional: Negative for chills and fever.  Respiratory: Positive for shortness of breath (with exertion). Negative for cough and wheezing.   Cardiovascular: Positive for palpitations (rare). Negative for chest pain and leg swelling.  Neurological: Negative for light-headedness and headaches.       Objective:   Vitals:   01/31/19 0939  BP: 130/60  Pulse: 70  Resp: 18  Temp: 98.6 F (37 C)  SpO2: 99%   BP Readings from Last 3 Encounters:  01/31/19 130/60  07/26/18 112/60  06/29/18 (!) 146/70   Wt Readings from Last 3 Encounters:  01/31/19 163 lb (73.9 kg)  07/26/18 156 lb 6.4 oz (70.9 kg)  06/29/18 159 lb 6.4 oz (72.3 kg)   Body mass index is 30.8 kg/m.   Physical Exam    Constitutional: Appears well-developed and well-nourished. No distress.  HENT:  Head: Normocephalic and atraumatic.  Neck: Neck supple. No tracheal deviation present. No thyromegaly present.  No cervical lymphadenopathy Cardiovascular: Normal rate, regular rhythm and normal heart sounds.   No murmur heard. No carotid bruit .  No edema Pulmonary/Chest: Effort normal and breath sounds normal. No respiratory distress. No has no wheezes. No rales.  Skin: Skin is warm and dry. Not diaphoretic.  Psychiatric: Normal mood and affect. Behavior is normal.      Assessment & Plan:    See Problem List for Assessment and Plan of chronic medical problems.

## 2019-01-30 NOTE — Patient Instructions (Addendum)

## 2019-01-31 ENCOUNTER — Telehealth: Payer: Self-pay | Admitting: Gastroenterology

## 2019-01-31 ENCOUNTER — Encounter: Payer: Self-pay | Admitting: Internal Medicine

## 2019-01-31 ENCOUNTER — Other Ambulatory Visit (INDEPENDENT_AMBULATORY_CARE_PROVIDER_SITE_OTHER): Payer: Medicare Other

## 2019-01-31 ENCOUNTER — Ambulatory Visit (INDEPENDENT_AMBULATORY_CARE_PROVIDER_SITE_OTHER): Payer: Medicare Other | Admitting: Internal Medicine

## 2019-01-31 ENCOUNTER — Other Ambulatory Visit: Payer: Self-pay

## 2019-01-31 VITALS — BP 130/60 | HR 70 | Temp 98.6°F | Resp 18 | Ht 61.0 in | Wt 163.0 lb

## 2019-01-31 DIAGNOSIS — R7303 Prediabetes: Secondary | ICD-10-CM

## 2019-01-31 DIAGNOSIS — K219 Gastro-esophageal reflux disease without esophagitis: Secondary | ICD-10-CM

## 2019-01-31 DIAGNOSIS — N183 Chronic kidney disease, stage 3 unspecified: Secondary | ICD-10-CM

## 2019-01-31 DIAGNOSIS — M792 Neuralgia and neuritis, unspecified: Secondary | ICD-10-CM

## 2019-01-31 DIAGNOSIS — M810 Age-related osteoporosis without current pathological fracture: Secondary | ICD-10-CM

## 2019-01-31 LAB — COMPREHENSIVE METABOLIC PANEL
ALT: 10 U/L (ref 0–35)
AST: 11 U/L (ref 0–37)
Albumin: 4.2 g/dL (ref 3.5–5.2)
Alkaline Phosphatase: 34 U/L — ABNORMAL LOW (ref 39–117)
BUN: 18 mg/dL (ref 6–23)
CO2: 24 mEq/L (ref 19–32)
Calcium: 8.2 mg/dL — ABNORMAL LOW (ref 8.4–10.5)
Chloride: 103 mEq/L (ref 96–112)
Creatinine, Ser: 1.07 mg/dL (ref 0.40–1.20)
GFR: 49.12 mL/min — ABNORMAL LOW (ref >60.00)
Glucose, Bld: 92 mg/dL (ref 70–99)
Potassium: 4.6 mEq/L (ref 3.5–5.1)
Sodium: 135 mEq/L (ref 135–145)
Total Bilirubin: 0.4 mg/dL (ref 0.2–1.2)
Total Protein: 6.9 g/dL (ref 6.0–8.3)

## 2019-01-31 LAB — CBC WITH DIFFERENTIAL/PLATELET
Basophils Absolute: 0.1 10*3/uL (ref 0.0–0.1)
Basophils Relative: 0.9 % (ref 0.0–3.0)
Eosinophils Absolute: 0.2 10*3/uL (ref 0.0–0.7)
Eosinophils Relative: 2.8 % (ref 0.0–5.0)
HCT: 34.3 % — ABNORMAL LOW (ref 36.0–46.0)
Hemoglobin: 11.5 g/dL — ABNORMAL LOW (ref 12.0–15.0)
Lymphocytes Relative: 24.2 % (ref 12.0–46.0)
Lymphs Abs: 1.5 10*3/uL (ref 0.7–4.0)
MCHC: 33.7 g/dL (ref 30.0–36.0)
MCV: 96.9 fl (ref 78.0–100.0)
Monocytes Absolute: 0.9 10*3/uL (ref 0.1–1.0)
Monocytes Relative: 14.2 % — ABNORMAL HIGH (ref 3.0–12.0)
Neutro Abs: 3.6 10*3/uL (ref 1.4–7.7)
Neutrophils Relative %: 57.9 % (ref 43.0–77.0)
Platelets: 324 10*3/uL (ref 150.0–400.0)
RBC: 3.54 Mil/uL — ABNORMAL LOW (ref 3.87–5.11)
RDW: 13.9 % (ref 11.5–15.5)
WBC: 6.2 10*3/uL (ref 4.0–10.5)

## 2019-01-31 LAB — HEMOGLOBIN A1C: Hgb A1c MFr Bld: 6.3 % (ref 4.6–6.5)

## 2019-01-31 MED ORDER — AMITRIPTYLINE HCL 10 MG PO TABS: 10.0000 mg | ORAL_TABLET | Freq: Every day | ORAL | Status: DC

## 2019-01-31 MED ORDER — OMEPRAZOLE 40 MG PO CPDR: DELAYED_RELEASE_CAPSULE | ORAL | 0 refills | Status: DC

## 2019-01-31 NOTE — Assessment & Plan Note (Addendum)
Getting prolia - last injection 08/2018 Taking vitamin d daily walking

## 2019-01-31 NOTE — Telephone Encounter (Signed)
Patient called for refill. She is set to see doctor 03/02/2019

## 2019-01-31 NOTE — Telephone Encounter (Signed)
Prescription sent to patient's pharmacy until scheduled appt. 

## 2019-01-31 NOTE — Assessment & Plan Note (Signed)
Will try amitriptyline 10 mg nightly -- did not tolerate 20 mg - it caused hallucinations

## 2019-01-31 NOTE — Assessment & Plan Note (Signed)
Check a1c Low sugar / carb diet Stressed regular exercise   

## 2019-01-31 NOTE — Assessment & Plan Note (Signed)
cmp Drinking water during day Continue to avoid nsaids

## 2019-01-31 NOTE — Assessment & Plan Note (Signed)
GERD controlled Continue daily medication  

## 2019-02-01 ENCOUNTER — Ambulatory Visit (INDEPENDENT_AMBULATORY_CARE_PROVIDER_SITE_OTHER): Payer: Medicare Other | Admitting: *Deleted

## 2019-02-01 DIAGNOSIS — I495 Sick sinus syndrome: Secondary | ICD-10-CM

## 2019-02-02 ENCOUNTER — Ambulatory Visit (INDEPENDENT_AMBULATORY_CARE_PROVIDER_SITE_OTHER): Payer: Medicare Other | Admitting: *Deleted

## 2019-02-02 DIAGNOSIS — Z0000 Encounter for general adult medical examination without abnormal findings: Secondary | ICD-10-CM

## 2019-02-02 LAB — CUP PACEART REMOTE DEVICE CHECK
Battery Remaining Longevity: 150 mo
Battery Remaining Percentage: 100 %
Brady Statistic RA Percent Paced: 31 %
Brady Statistic RV Percent Paced: 0 %
Date Time Interrogation Session: 20200707070000
Implantable Lead Implant Date: 20171009
Implantable Lead Implant Date: 20171009
Implantable Lead Location: 753859
Implantable Lead Location: 753860
Implantable Lead Model: 5076
Implantable Lead Model: 7741
Implantable Lead Serial Number: 785430
Implantable Pulse Generator Implant Date: 20171009
Lead Channel Impedance Value: 388 Ohm
Lead Channel Impedance Value: 611 Ohm
Lead Channel Pacing Threshold Amplitude: 1.1 V
Lead Channel Pacing Threshold Pulse Width: 0.4 ms
Lead Channel Setting Pacing Amplitude: 2 V
Lead Channel Setting Pacing Amplitude: 2.5 V
Lead Channel Setting Pacing Pulse Width: 0.4 ms
Lead Channel Setting Sensing Sensitivity: 2.5 mV
Pulse Gen Serial Number: 766467

## 2019-02-02 NOTE — Progress Notes (Addendum)
Subjective:   [name] is a 84 y.o. female who presents for Medicare Annual (Subsequent) preventive examination. I connected with patient by a telephone and verified that I am speaking with the correct person using two identifiers. Patient stated full name and DOB. Patient gave permission to continue with telephonic visit. Patient's location was at home and Nurse's location was at [place] office.   Review of Systems:     Sleep patterns: gets up 1-2 times nightly to void and sleeps 6-8 hours nightly.    Home Safety/Smoke Alarms: Feels safe in home. Smoke alarms in place.  Living environment; residence and Firearm Safety: apartment, no firearms, firearms stored safely. Lives with husband, no needs for DME, good support system Seat Belt Safety/Bike Helmet: Wears seat belt.      Objective:     Vitals: There were no vitals taken for this visit.  There is no height or weight on file to calculate BMI.  Advanced Directives 12/17/2017 01/26/2017 12/10/2016 07/01/2016 05/05/2016  Does Patient Have a Medical Advance Directive? Yes Yes Yes No Yes  Type of Paramedic of [place];Living will [place];Living will [place];Living will - Living will  Does patient want to make changes to medical advance directive? - - - - No - Patient declined  Copy of [place] in Chart? No - copy requested - No - copy requested - No - copy requested  Would patient like information on creating a medical advance directive? - - - No - Patient declined -    Tobacco Social History   Tobacco Use  Smoking Status Never Smoker  Smokeless Tobacco Never Used     Counseling given: Not Answered  Past Medical History:  Diagnosis Date  . Cancer of left breast ([place]) 1978  . Chronic thoracic back pain    "T8; fracture; 03/2015; no OR" (05/05/2016)  . GERD (gastroesophageal reflux disease)   . Heart murmur   . History of hiatal hernia    . Hyperlipidemia   . Hyperplastic colon polyp   . IBS (irritable bowel syndrome)   . Multiple thyroid nodules   . Osteoporosis    T8 compression fx 03/2015   . Personal history of radiation therapy   . Presence of permanent cardiac pacemaker   . Vitamin B12 deficiency    Past Surgical History:  Procedure Laterality Date  . ANTERIOR CERVICAL DECOMP/DISCECTOMY FUSION  2008   C5-6  . AUGMENTATION MAMMAPLASTY    . BACK SURGERY    . BREAST SURGERY    . CARDIAC CATHETERIZATION  2001  . EP IMPLANTABLE DEVICE N/A 05/05/2016   Procedure: Pacemaker Implant;  Surgeon: Evans Lance, MD;  Location: [facility];  Service: Cardiovascular;  Laterality: N/A;  . EXCISIONAL HEMORRHOIDECTOMY  1984   With subsequent correction of surgery  . INSERT / REPLACE / REMOVE PACEMAKER    . KNEE ARTHROSCOPY Left    "meniscus tear"  . MASTECTOMY Left 1978  . PLACEMENT OF BREAST IMPLANTS Left 1981  . SHOULDER ARTHROSCOPY W/ ROTATOR CUFF REPAIR Left 02/2006   "tear"  . TUBAL LIGATION     Family History  Problem Relation Age of Onset  . Diabetes Sister   . Diabetes Brother   . Breast cancer Maternal Aunt   . Colon cancer Neg Hx    Social History   Socioeconomic History  . Marital status: Married    Spouse name: Not on file  . Number  of children: 2  . Years of education: Not on file  . Highest education level: Not on file  Occupational History  . Not on file  Social Needs  . Financial resource strain: Not hard at all  . Food insecurity    Worry: Never true    Inability: Never true  . Transportation needs    Medical: No    Non-medical: No  Tobacco Use  . Smoking status: Never Smoker  . Smokeless tobacco: Never Used  Substance and Sexual Activity  . Alcohol use: No    Alcohol/week: 0.0 standard drinks  . Drug use: No  . Sexual activity: Not Currently  Lifestyle  . Physical activity    Days per week: 3 days    Minutes per session: 50 min  . Stress: Only a little  Relationships   . Social connections    Talks on phone: More than three times a week    Gets together: More than three times a week    Attends religious service: More than 4 times per year    Active member of club or organization: Yes    Attends meetings of clubs or organizations: More than 4 times per year    Relationship status: Married  Other Topics Concern  . Not on file  Social History Narrative   Housewife, Lives with spouse, 2 children    Outpatient Encounter Medications as of 02/02/2019  Medication Sig  . acetaminophen (TYLENOL) 500 MG tablet Take 500 mg by mouth 2 (two) times daily.  [name] amitriptyline (ELAVIL) 10 MG tablet Take 1 tablet (10 mg total) by mouth at bedtime.  [name] aspirin 81 MG tablet Take 81 mg by mouth daily.  . cholecalciferol (VITAMIN D) 1000 UNITS tablet Take 2,000 Units by mouth daily.   . Cyanocobalamin (RA VITAMIN B-12 TR) 1000 MCG TBCR Take 1 tablet by mouth every other day.   . fluticasone (FLONASE) 50 MCG/ACT nasal spray Place 1 spray into both nostrils daily as needed for allergies.   . furosemide (LASIX) 40 MG tablet TAKE 1 TABLET ONCE DAILY.  [name] loratadine (CLARITIN) 10 MG tablet Take 10 mg by mouth daily as needed for allergies.  [name] omeprazole (PRILOSEC) 40 MG capsule TAKE (1) CAPSULE TWICE DAILY.  [name] polyethylene glycol (MIRALAX / GLYCOLAX) packet Take 17 g by mouth daily as needed (constipation).   . potassium chloride SA (K-DUR,KLOR-CON) 20 MEQ tablet TAKE 1 TABLET ONCE DAILY.  . [DISCONTINUED] omeprazole (PRILOSEC) 40 MG capsule TAKE (1) CAPSULE TWICE DAILY.   No facility-administered encounter medications on file as of 02/02/2019.     Activities of Daily Living No flowsheet data found.  Patient Care Team: [name], MD as PCP - General (Internal Medicine) Ladene Artist, MD (Gastroenterology) Servando Salina, MD (Obstetrics and Gynecology) Melida Quitter, MD as Consulting Physician (Otolaryngology) Allyn Kenner, MD (Dermatology) Calvert Cantor, MD  (Ophthalmology) Berle Mull, MD (Sports Medicine) Jovita Gamma, MD as Consulting Physician (Neurosurgery) Evans Lance, MD as Consulting Physician (Cardiology) [place], Peter M, MD as Consulting Physician (Cardiology)    Assessment:   This is a routine wellness examination for [name]. Physical assessment deferred to PCP.   Exercise Activities and Dietary recommendations   Diet (meal preparation, eat out, water intake, caffeinated beverages, dairy products, fruits and vegetables): in general, a "healthy" diet  , well balanced   Reviewed heart healthy and diabetic diet.  Encouraged patient to increase daily water and healthy fluid intake.  Goals    . lose  weight     Increase the amount of exercise weekly, continue to eat healthy.    . Patient Stated     Continue to exercise, eat healthy, worship God, enjoy family and life.       Fall Risk Fall Risk  01/31/2019 12/17/2017 06/26/2017 12/10/2016 11/17/2016  Falls in the past year? 0 No No No No  Comment - - - - -  Number falls in past yr: 0 - - - -  Injury with Fall? - - - - -  Risk for fall due to : - - - - -  Follow up - - - - -    Depression Screen PHQ 2/9 Scores 01/31/2019 12/17/2017 12/10/2016 03/14/2016  PHQ - 2 Score 0 0 0 0     Cognitive Function MMSE - Mini Mental State Exam 12/17/2017  Not completed: Refused       Ad8 score reviewed for issues:  Issues making decisions: no  Less interest in hobbies / activities: no  Repeats questions, stories (family complaining): no  Trouble using ordinary gadgets (microwave, computer, phone):no  Forgets the month or year: no  Mismanaging finances: no  Remembering appts: no  Daily problems with thinking and/or memory: no Ad8 score is= 0  Immunization History  Administered Date(s) Administered  . Influenza Split 06/27/2011, 06/09/2012  . Influenza Whole 07/09/2009, 07/02/2010  . Influenza, High Dose Seasonal PF 05/02/2013, 05/31/2014, 06/26/2015, 06/02/2016,  05/22/2017, 05/17/2018  . Pneumococcal Conjugate-13 06/19/2014  . Pneumococcal Polysaccharide-23 07/28/2000, 10/09/2011  . Td 07/28/2000  . Tdap 10/15/2010  . Zoster 03/12/2011    Screening Tests Health Maintenance  Topic Date Due  . INFLUENZA VACCINE  02/26/2019  . DEXA SCAN  07/06/2020  . TETANUS/TDAP  10/14/2020  . PNA vac Low Risk Adult  Completed      Plan:   Reviewed health maintenance screenings with patient today and relevant education, vaccines, and/or referrals were provided.   Continue to eat heart healthy diet (full of fruits, vegetables, whole grains, lean protein, water--limit salt, fat, and sugar intake) and increase physical activity as tolerated.  Continue doing brain stimulating activities (puzzles, reading, adult coloring books, staying active) to keep memory sharp.   I have personally reviewed and noted the following in the patient's chart:   . Medical and social history . Use of alcohol, tobacco or illicit drugs  . Current medications and supplements . Functional ability and status . Nutritional status . Physical activity . Advanced directives . List of other physicians . Screenings to include cognitive, depression, and falls . Referrals and appointments  In addition, I have reviewed and discussed with patient certain preventive protocols, quality metrics, and best practice recommendations. A written personalized care plan for preventive services as well as general preventive health recommendations were provided to patient.     Michiel Cowboy, RN  02/02/2019    Medical screening examination/treatment/procedure(s) were performed by non-physician practitioner and as supervising physician I was immediately available for consultation/collaboration. I agree with above. [name], MD

## 2019-02-04 DIAGNOSIS — X32XXXD Exposure to sunlight, subsequent encounter: Secondary | ICD-10-CM | POA: Diagnosis not present

## 2019-02-04 DIAGNOSIS — L218 Other seborrheic dermatitis: Secondary | ICD-10-CM | POA: Diagnosis not present

## 2019-02-04 DIAGNOSIS — L578 Other skin changes due to chronic exposure to nonionizing radiation: Secondary | ICD-10-CM | POA: Diagnosis not present

## 2019-02-04 DIAGNOSIS — L570 Actinic keratosis: Secondary | ICD-10-CM | POA: Diagnosis not present

## 2019-02-04 DIAGNOSIS — D225 Melanocytic nevi of trunk: Secondary | ICD-10-CM | POA: Diagnosis not present

## 2019-02-12 ENCOUNTER — Encounter: Payer: Self-pay | Admitting: Cardiology

## 2019-02-12 NOTE — Progress Notes (Signed)
Remote pacemaker transmission.   

## 2019-02-21 ENCOUNTER — Telehealth: Payer: Self-pay | Admitting: Family Medicine

## 2019-02-21 NOTE — Telephone Encounter (Signed)
Appt scheduled

## 2019-02-21 NOTE — Telephone Encounter (Signed)
Copied from [place] [id number]. Topic: Appointment Scheduling - Scheduling Inquiry for Clinic >> Feb 21, 2019  9:19 AM Parke Poisson wrote: Reason for CRM: Pt would like an appointment with Dr Tamala Julian for neck and left shoulder pain,She states it is hard for her to turn her head. Was told by office to send crm

## 2019-02-21 NOTE — Telephone Encounter (Signed)
Is it possible that pt could get worked in somewhere?

## 2019-02-23 ENCOUNTER — Ambulatory Visit (INDEPENDENT_AMBULATORY_CARE_PROVIDER_SITE_OTHER): Payer: Medicare Other | Admitting: Family Medicine

## 2019-02-23 ENCOUNTER — Other Ambulatory Visit: Payer: Self-pay

## 2019-02-23 DIAGNOSIS — M542 Cervicalgia: Secondary | ICD-10-CM | POA: Diagnosis not present

## 2019-02-23 MED ORDER — GABAPENTIN 100 MG PO CAPS: 200.0000 mg | ORAL_CAPSULE | Freq: Every day | ORAL | 3 refills | Status: DC

## 2019-02-23 NOTE — Progress Notes (Signed)
[facility] [address] [place], [place] [address] Phone: [phone] Subjective:   [name], am serving as a scribe for Dr. Hulan Saas.   CC: Neck and shoulder pain  [id number]  [name] is a 84 y.o. female coming in with complaint of left neck and left shoulder pain for one week. Pain was worse when she made appointment. Patient did have hard time raising arm above her head but is able to now. Patient's left trap looks very tight when she points to area that is bothering her. Patient is unable to turn in bed due to pain. Has been using heat and Tylenol.       Past Medical History:  Diagnosis Date  . Cancer of left breast ([place]) 1978  . Chronic thoracic back pain    "T8; fracture; 03/2015; [name] OR" (05/05/2016)  . GERD (gastroesophageal reflux disease)   . Heart murmur   . History of hiatal hernia   . Hyperlipidemia   . Hyperplastic colon polyp   . IBS (irritable bowel syndrome)   . Multiple thyroid nodules   . Osteoporosis    T8 compression fx 03/2015   . Personal history of radiation therapy   . Presence of permanent cardiac pacemaker   . Vitamin B12 deficiency    Past Surgical History:  Procedure Laterality Date  . ANTERIOR CERVICAL DECOMP/DISCECTOMY FUSION  2008   C5-6  . AUGMENTATION MAMMAPLASTY    . BACK SURGERY    . BREAST SURGERY    . CARDIAC CATHETERIZATION  2001  . EP IMPLANTABLE DEVICE N/A 05/05/2016   Procedure: Pacemaker Implant;  Surgeon: Evans Lance, MD;  Location: [facility];  Service: Cardiovascular;  Laterality: N/A;  . EXCISIONAL HEMORRHOIDECTOMY  1984   With subsequent correction of surgery  . INSERT / REPLACE / REMOVE PACEMAKER    . KNEE ARTHROSCOPY Left    "meniscus tear"  . MASTECTOMY Left 1978  . PLACEMENT OF BREAST IMPLANTS Left 1981  . SHOULDER ARTHROSCOPY W/ ROTATOR CUFF REPAIR Left 02/2006   "tear"  . TUBAL LIGATION     Social History   Socioeconomic History  . Marital  status: Married    Spouse name: Not on file  . Number of children: 2  . Years of education: Not on file  . Highest education level: Not on file  Occupational History  . Occupation: retired  Scientific laboratory technician  . Financial resource strain: Not hard at all  . Food insecurity    Worry: Never true    Inability: Never true  . Transportation needs    Medical: [name]    Non-medical: [name]  Tobacco Use  . Smoking status: Never Smoker  . Smokeless tobacco: Never Used  Substance and Sexual Activity  . Alcohol use: [name]    Alcohol/week: 0.0 standard drinks  . Drug use: [name]  . Sexual activity: Not Currently  Lifestyle  . Physical activity    Days per week: 3 days    Minutes per session: 50 min  . Stress: Only a little  Relationships  . Social connections    Talks on phone: More than three times a week    Gets together: More than three times a week    Attends religious service: More than 4 times per year    Active member of club or organization: Yes    Attends meetings of clubs or organizations: More than 4 times per year    Relationship status:  Married  Other Topics Concern  . Not on file  Social History Narrative   Housewife, Lives with spouse, 2 children   Allergies  Allergen Reactions  . Lipitor [Atorvastatin]     Lipitor caused hospitalization - depletion of electrolytes, fever, nausea, loss of appetite, couldn't get out of bed  . Statins     Lipitor caused hospitalization - - depletion of electrolytes, fever, nausea, loss of appetite, couldn't get out of bed  . Cymbalta [Duloxetine Hcl]     Dulled her too much, difficulty urinating, change in vision  . Fosamax [Alendronate Sodium]     Caused chronic issues swallowing  . Lyrica [Pregabalin]     Unknown  . Neurontin [Gabapentin] Other (See Comments)    Dizziness and sedation  . Zanaflex [Tizanidine] Other (See Comments)    Could not sleep   Family History  Problem Relation Age of Onset  . Diabetes Sister   . Diabetes Brother   .  Breast cancer Maternal Aunt   . Colon cancer Neg Hx      Current Outpatient Medications (Cardiovascular):  .  furosemide (LASIX) 40 MG tablet, TAKE 1 TABLET ONCE DAILY.  Current Outpatient Medications (Respiratory):  .  fluticasone (FLONASE) 50 MCG/ACT nasal spray, Place 1 spray into both nostrils daily as needed for allergies.  [name]  loratadine (CLARITIN) 10 MG tablet, Take 10 mg by mouth daily as needed for allergies.  Current Outpatient Medications (Analgesics):  .  acetaminophen (TYLENOL) 500 MG tablet, Take 500 mg by mouth 2 (two) times daily.  Current Outpatient Medications (Hematological):  [name]  Cyanocobalamin (RA VITAMIN B-12 TR) 1000 MCG TBCR, Take 1 tablet by mouth every other day.   Current Outpatient Medications (Other):  .  amitriptyline (ELAVIL) 10 MG tablet, Take 1 tablet (10 mg total) by mouth at bedtime. .  cholecalciferol (VITAMIN D) 1000 UNITS tablet, Take 2,000 Units by mouth daily.  [name]  omeprazole (PRILOSEC) 40 MG capsule, TAKE (1) CAPSULE TWICE DAILY. [name]  polyethylene glycol (MIRALAX / GLYCOLAX) packet, Take 17 g by mouth daily as needed (constipation).  .  potassium chloride SA (K-DUR,KLOR-CON) 20 MEQ tablet, TAKE 1 TABLET ONCE DAILY. [name]  gabapentin (NEURONTIN) 100 MG capsule, Take 2 capsules (200 mg total) by mouth at bedtime.    Past medical history, social, surgical and family history all reviewed in electronic medical record.  [name] pertanent information unless stated regarding to the chief complaint.   Review of Systems:  [name] headache, visual changes, nausea, vomiting, diarrhea, constipation, dizziness, abdominal pain, skin rash, fevers, chills, night sweats, weight loss, swollen lymph nodes, body aches, joint swelling, muscle aches, chest pain, shortness of breath, mood changes.   Objective  Blood pressure (!) 122/58, pulse 99, height 5\' 1"  (1.549 m), weight 162 lb (73.5 kg), SpO2 98 %.    General: [name] apparent distress alert and oriented x3 mood and affect  normal, dressed appropriately.  HEENT: Pupils equal, extraocular movements intact  Respiratory: Patient's speak in full sentences and does not appear short of breath  Cardiovascular: [name] lower extremity edema, non tender, [name] erythema  Skin: Warm dry intact with [name] signs of infection or rash on extremities or on axial skeleton.  Abdomen: Soft nontender  Neuro: Cranial nerves II through XII are intact, neurovascularly intact in all extremities with 2+ DTRs and 2+ pulses.  Lymph: [name] lymphadenopathy of posterior or anterior cervical chain or axillae bilaterally.  Gait normal with good balance and coordination.  MSK:  Non tender with full  range of motion and good stability and symmetric strength and tone of elbows, wrist, hip, knee and ankles bilaterally.  Shoulder: left Inspection reveals [name] abnormalities, atrophy or asymmetry. Palpation is normal with [name] tenderness over AC joint or bicipital groove. ROM is full in all planes. Rotator cuff strength normal throughout and symmetric to contralateral side. Mild impingement noted Speeds and Yergason's tests normal. [name] labral pathology noted with negative Obrien's, negative clunk and good stability. Normal scapular function observed. [name] painful arc and [name] drop arm sign. [name] apprehension sign   Neck: Inspection patient does have some increasing lordosis as well as increased kyphosis of the upper thoracic. [name] palpable stepoffs. Negative Spurling's maneuver. Limited range of motion in all planes of 5 to 10 degrees.  Patient has minimal extension Grip strength and sensation normal in bilateral hands Strength good C4 to T1 distribution [name] sensory change to C4 to T1 Negative Hoffman sign bilaterally Reflexes normal Significant tightness in the left trapezius area.  [id number]; 15 additional minutes spent for Therapeutic exercises as stated in above notes.  This included exercises focusing on stretching, strengthening, with significant focus on eccentric  aspects.   Long term goals include an improvement in range of motion, strength, endurance as well as avoiding reinjury. Patient's frequency would include in 1-2 times a day, 3-5 times a week for a duration of 6-12 weeks.Exercises that included:  Basic scapular stabilization to include adduction and depression of scapula Scaption, focusing on proper movement and good control Internal and External rotation utilizing a theraband, with elbow tucked at side entire time Rows with theraband   Proper technique shown and discussed handout in great detail with ATC.  All questions were discussed and answered.      Impression and Recommendations:     This case required medical decision making of moderate complexity. The above documentation has been reviewed and is accurate and complete Lyndal Pulley, DO       Note: This dictation was prepared with Dragon dictation along with smaller phrase technology. Any transcriptional errors that result from this process are unintentional.

## 2019-02-23 NOTE — Assessment & Plan Note (Signed)
Degenerative disc disease discussed with patient about posture and ergonomics.  Discussed which activities of doing which was to avoid.  Low dose of gabapentin given at night.  Discontinue the amitriptyline at bedtime.  I believe that this could help with more of the pain.  Differential includes more of the rotator cuff arthropathy versus the possibility of cervical neuropathy.  Patient does not find any significant weakness more than her contralateral side at the moment.  Patient will follow-up with me again in 4 to 8 weeks

## 2019-02-23 NOTE — Patient Instructions (Signed)
Good to see you Ice 20 minutes 2 times daily. Usually after activity and before bed. Exercises 3 times a week.  pennsaid pinkie amount topically 2 times daily as needed.  Gabapentin 200 mg at night  Vitamin D 2000 IU daily  See me again in 4-8 weeks

## 2019-02-24 ENCOUNTER — Encounter: Payer: Self-pay | Admitting: Family Medicine

## 2019-03-02 ENCOUNTER — Ambulatory Visit (INDEPENDENT_AMBULATORY_CARE_PROVIDER_SITE_OTHER): Payer: Medicare Other | Admitting: Gastroenterology

## 2019-03-02 ENCOUNTER — Encounter: Payer: Self-pay | Admitting: Gastroenterology

## 2019-03-02 VITALS — BP 134/56 | HR 64 | Temp 98.4°F | Ht 61.5 in | Wt 161.0 lb

## 2019-03-02 DIAGNOSIS — K5904 Chronic idiopathic constipation: Secondary | ICD-10-CM

## 2019-03-02 DIAGNOSIS — K219 Gastro-esophageal reflux disease without esophagitis: Secondary | ICD-10-CM

## 2019-03-02 MED ORDER — OMEPRAZOLE 40 MG PO CPDR: DELAYED_RELEASE_CAPSULE | ORAL | 11 refills | Status: DC

## 2019-03-02 NOTE — Progress Notes (Signed)
    History of Present Illness: This is a 84 year female who relates difficulties with constipation, gas, occasional flatulence with incontinence, occasional lower abdominal pain.  She states she generally has small bowel movements daily for several days and then will have 1 day where she has lower abdominal discomfort and multiple large bowel movements.  Prior to the days with multiple large bowel movements she will have intestinal gas and occasionally small amounts of stool when passing flatus.  She notes lower abdominal discomfort on the days when she has multiple large bowel movements.  She takes MiraLAX once daily which has been somewhat helpful.  She notes occasionally she will gag or chokes briefly when drinking water.  No solid food dysphagia.  Reflux symptoms well controlled.  Current Medications, Allergies, Past Medical History, Past Surgical History, Family History and Social History were reviewed in [employer] record.   Physical Exam: General: Well developed, well nourished, no acute distress Head: Normocephalic and atraumatic Eyes:  sclerae anicteric, EOMI Ears: Normal auditory acuity Mouth: No deformity or lesions Lungs: Clear throughout to auscultation Heart: Regular rate and rhythm; no murmurs, rubs or bruits Abdomen: Soft, non tender and non distended. No masses, hepatosplenomegaly or hernias noted. Normal Bowel sounds Rectal: Not done Musculoskeletal: Symmetrical with no gross deformities  Pulses:  Normal pulses noted Extremities: No clubbing, cyanosis, edema or deformities noted Neurological: Alert oriented x 4, grossly nonfocal Psychological:  Alert and cooperative. Normal mood and affect   Assessment and Recommendations:  1. GERD.  Occasional dysphagia with water likely oropharyngeal.  If symptoms persist or worsen consider repeating MBSS. Follow standard antireflux measures.  Continue omeprazole 40 mg p.o. twice daily.  2. Constipation, gas,  occasional lower abdominal pain, occasional flatulence with some stool.  It appears that her constipation is not adequately managed advised to increase MiraLAX to twice daily to achieve a complete bowel movement on a daily basis.  She may further adjust to 3 times daily if necessary.  If this does not adequately manage her symptoms she is advised to call us within the next 3 weeks and we will consider a trial of Trulance or Linzess.

## 2019-03-02 NOTE — Patient Instructions (Signed)
We have sent the following medications to your pharmacy for you to pick up at your convenience: omeprazole.  Increase your Miralax to twice daily.   Call back in 3 weeks if your symptoms of constipation have not improved.  Thank you for choosing me and [facility].  Pricilla Riffle. Dagoberto Ligas., MD., Marval Regal

## 2019-03-11 ENCOUNTER — Telehealth: Payer: Self-pay | Admitting: Gastroenterology

## 2019-03-11 DIAGNOSIS — H00015 Hordeolum externum left lower eyelid: Secondary | ICD-10-CM | POA: Diagnosis not present

## 2019-03-11 DIAGNOSIS — H04123 Dry eye syndrome of bilateral lacrimal glands: Secondary | ICD-10-CM | POA: Diagnosis not present

## 2019-03-11 NOTE — Telephone Encounter (Signed)
Pt would like advise on which gas pill to take.

## 2019-03-11 NOTE — Telephone Encounter (Signed)
Patient informed. 

## 2019-03-11 NOTE — Telephone Encounter (Signed)
Gas-X as directed on the medication

## 2019-03-11 NOTE — Telephone Encounter (Signed)
Which one do you prefer her to take Sir? Thank you.

## 2019-03-30 ENCOUNTER — Ambulatory Visit (INDEPENDENT_AMBULATORY_CARE_PROVIDER_SITE_OTHER): Payer: Medicare Other

## 2019-03-30 ENCOUNTER — Ambulatory Visit: Payer: Medicare Other | Admitting: Family Medicine

## 2019-03-30 DIAGNOSIS — M810 Age-related osteoporosis without current pathological fracture: Secondary | ICD-10-CM | POA: Diagnosis not present

## 2019-03-30 MED ORDER — DENOSUMAB 60 MG/ML ~~LOC~~ SOSY
60.0000 mg | PREFILLED_SYRINGE | Freq: Once | SUBCUTANEOUS | Status: AC
  Administered 2019-03-30: 60 mg via SUBCUTANEOUS

## 2019-03-30 NOTE — Progress Notes (Signed)
prolia injection given.   Shauntea Lok J Eldoris Beiser, MD  

## 2019-04-25 ENCOUNTER — Other Ambulatory Visit: Payer: Self-pay | Admitting: Internal Medicine

## 2019-05-03 ENCOUNTER — Ambulatory Visit (INDEPENDENT_AMBULATORY_CARE_PROVIDER_SITE_OTHER): Payer: Medicare Other | Admitting: *Deleted

## 2019-05-03 DIAGNOSIS — I495 Sick sinus syndrome: Secondary | ICD-10-CM

## 2019-05-04 LAB — CUP PACEART REMOTE DEVICE CHECK
Date Time Interrogation Session: 20201007133258
Implantable Lead Implant Date: 20171009
Implantable Lead Implant Date: 20171009
Implantable Lead Location: 753859
Implantable Lead Location: 753860
Implantable Lead Model: 5076
Implantable Lead Model: 7741
Implantable Lead Serial Number: 785430
Implantable Pulse Generator Implant Date: 20171009
Pulse Gen Serial Number: 766467

## 2019-05-12 NOTE — Progress Notes (Signed)
Remote pacemaker transmission.   

## 2019-05-17 ENCOUNTER — Other Ambulatory Visit: Payer: Self-pay

## 2019-05-17 ENCOUNTER — Ambulatory Visit (INDEPENDENT_AMBULATORY_CARE_PROVIDER_SITE_OTHER): Payer: Medicare Other

## 2019-05-17 DIAGNOSIS — Z23 Encounter for immunization: Secondary | ICD-10-CM | POA: Diagnosis not present

## 2019-07-19 ENCOUNTER — Other Ambulatory Visit: Payer: Self-pay

## 2019-07-19 ENCOUNTER — Ambulatory Visit (INDEPENDENT_AMBULATORY_CARE_PROVIDER_SITE_OTHER): Payer: Medicare Other | Admitting: Internal Medicine

## 2019-07-19 ENCOUNTER — Encounter: Payer: Self-pay | Admitting: Internal Medicine

## 2019-07-19 ENCOUNTER — Encounter (INDEPENDENT_AMBULATORY_CARE_PROVIDER_SITE_OTHER): Payer: Self-pay

## 2019-07-19 VITALS — BP 122/60 | HR 60 | Ht 62.0 in | Wt 165.0 lb

## 2019-07-19 DIAGNOSIS — I495 Sick sinus syndrome: Secondary | ICD-10-CM

## 2019-07-19 DIAGNOSIS — Z950 Presence of cardiac pacemaker: Secondary | ICD-10-CM | POA: Diagnosis not present

## 2019-07-19 MED ORDER — ISOSORBIDE MONONITRATE ER 30 MG PO TB24: 30.0000 mg | ORAL_TABLET | Freq: Every day | ORAL | 3 refills | Status: DC

## 2019-07-19 MED ORDER — METOPROLOL SUCCINATE ER 25 MG PO TB24: 25.0000 mg | ORAL_TABLET | Freq: Every day | ORAL | 3 refills | Status: DC

## 2019-07-19 NOTE — Patient Instructions (Addendum)
Medication Instructions:  Your physician recommends that you continue on your current medications as directed. Please refer to the Current Medication list given to you today.  Start IMDUR 30mg  by mouth 1 tablet daily TOPROL 25mg  by mouth 1 tablet daily  Labwork: None ordered.  Testing/Procedures: None ordered.  Follow-Up: Your physician wants you to follow-up 09/19/2019 at 245pm with Dr Lovena Le. Remote monitoring is used to monitor your Pacemaker  from home. This monitoring reduces the number of office visits required to check your device to one time per year. It allows [place] to keep an eye on the functioning of your device to ensure it is working properly. You are scheduled for a device check from home on 08/02/2019. You may send your transmission at any time that day. If you have a wireless device, the transmission will be sent automatically. After your physician reviews your transmission, you will receive a postcard with your next transmission date.      Any Other Special Instructions Will Be Listed Below (If Applicable).  If you need a refill on your cardiac medications before your next appointment, please call your pharmacy.

## 2019-07-19 NOTE — Progress Notes (Signed)
HPI [name] returns today for followup. She is a pleasant 84 yo woman with peripheral vascular disease and HTN who underwent PPM insertion due to sinus node dysfunction. She has had problems of neck discomfort and sob with exertion. This sounds like angina though she is not having frank chest pain. No edema. No syncope.  Allergies  Allergen Reactions  . Lipitor [Atorvastatin]     Lipitor caused hospitalization - depletion of electrolytes, fever, nausea, loss of appetite, couldn't get out of bed  . Statins     Lipitor caused hospitalization - - depletion of electrolytes, fever, nausea, loss of appetite, couldn't get out of bed  . Cymbalta [Duloxetine Hcl]     Dulled her too much, difficulty urinating, change in vision  . Fosamax [Alendronate Sodium]     Caused chronic issues swallowing  . Lyrica [Pregabalin]     Unknown  . Neurontin [Gabapentin] Other (See Comments)    Dizziness and sedation  . Zanaflex [Tizanidine] Other (See Comments)    Could not sleep     Current Outpatient Medications  Medication Sig Dispense Refill  . acetaminophen (TYLENOL) 500 MG tablet Take 500 mg by mouth 2 (two) times daily.    . cholecalciferol (VITAMIN D) 1000 UNITS tablet Take 2,000 Units by mouth daily.     . Cyanocobalamin (RA VITAMIN B-12 TR) 1000 MCG TBCR Take 1 tablet by mouth every other day.     . fluticasone (FLONASE) 50 MCG/ACT nasal spray Place 1 spray into both nostrils daily as needed for allergies.     . furosemide (LASIX) 40 MG tablet Take 1 tablet (40 mg total) by mouth daily. Please keep upcoming appt for future refills. 90 tablet 0  . gabapentin (NEURONTIN) 100 MG capsule Take 2 capsules (200 mg total) by mouth at bedtime. 180 capsule 3  . loratadine (CLARITIN) 10 MG tablet Take 10 mg by mouth daily as needed for allergies.    [name] omeprazole (PRILOSEC) 40 MG capsule TAKE (1) CAPSULE TWICE DAILY. 60 capsule 11  . polyethylene glycol (MIRALAX / GLYCOLAX) packet Take 17 g by mouth  daily as needed (constipation).     . potassium chloride SA (K-DUR) 20 MEQ tablet Take 1 tablet (20 mEq total) by mouth daily. Please keep upcoming appt for future refills. 90 tablet 0   No current facility-administered medications for this visit.     Past Medical History:  Diagnosis Date  . Cancer of left breast ([place]) 1978  . Chronic thoracic back pain    "T8; fracture; 03/2015; no OR" (05/05/2016)  . GERD (gastroesophageal reflux disease)   . Heart murmur   . History of hiatal hernia   . Hyperlipidemia   . Hyperplastic colon polyp   . IBS (irritable bowel syndrome)   . Multiple thyroid nodules   . Osteoporosis    T8 compression fx 03/2015   . Personal history of radiation therapy   . Presence of permanent cardiac pacemaker   . Vitamin B12 deficiency     ROS:   All systems reviewed and negative except as noted in the HPI.   Past Surgical History:  Procedure Laterality Date  . ANTERIOR CERVICAL DECOMP/DISCECTOMY FUSION  2008   C5-6  . AUGMENTATION MAMMAPLASTY    . BACK SURGERY    . BREAST SURGERY    . CARDIAC CATHETERIZATION  2001  . EP IMPLANTABLE DEVICE N/A 05/05/2016   Procedure: Pacemaker Implant;  Surgeon: Evans Lance, MD;  Location: [facility];  Service: Cardiovascular;  Laterality: N/A;  . EXCISIONAL HEMORRHOIDECTOMY  1984   With subsequent correction of surgery  . INSERT / REPLACE / REMOVE PACEMAKER    . KNEE ARTHROSCOPY Left    "meniscus tear"  . MASTECTOMY Left 1978  . PLACEMENT OF BREAST IMPLANTS Left 1981  . SHOULDER ARTHROSCOPY W/ ROTATOR CUFF REPAIR Left 02/2006   "tear"  . TUBAL LIGATION       Family History  Problem Relation Age of Onset  . Diabetes Sister   . Diabetes Brother   . Breast cancer Maternal Aunt   . Colon cancer Neg Hx      Social History   Socioeconomic History  . Marital status: Married    Spouse name: Not on file  . Number of children: 2  . Years of education: Not on file  . Highest education level: Not on  file  Occupational History  . Occupation: retired  Tobacco Use  . Smoking status: Never Smoker  . Smokeless tobacco: Never Used  Substance and Sexual Activity  . Alcohol use: No    Alcohol/week: 0.0 standard drinks  . Drug use: No  . Sexual activity: Not Currently  Other Topics Concern  . Not on file  Social History Narrative   Housewife, Lives with spouse, 2 children   Social Determinants of Health   Financial Resource Strain:   . Difficulty of Paying Living Expenses: Not on file  Food Insecurity:   . Worried About Charity fundraiser in the Last Year: Not on file  . Ran Out of Food in the Last Year: Not on file  Transportation Needs:   . Lack of Transportation (Medical): Not on file  . Lack of Transportation (Non-Medical): Not on file  Physical Activity:   . Days of Exercise per Week: Not on file  . Minutes of Exercise per Session: Not on file  Stress:   . Feeling of Stress : Not on file  Social Connections:   . Frequency of Communication with Friends and Family: Not on file  . Frequency of Social Gatherings with Friends and Family: Not on file  . Attends Religious Services: Not on file  . Active Member of Clubs or Organizations: Not on file  . Attends Archivist Meetings: Not on file  . Marital Status: Not on file  Intimate Partner Violence:   . Fear of Current or Ex-Partner: Not on file  . Emotionally Abused: Not on file  . Physically Abused: Not on file  . Sexually Abused: Not on file     BP 122/60   Pulse 60   Ht 5\' 2"  (1.575 m)   Wt 165 lb (74.8 kg)   SpO2 99%   BMI 30.18 kg/m   Physical Exam:  Well appearing NAD HEENT: Unremarkable Neck:  No JVD, no thyromegally Lymphatics:  No adenopathy Back:  No CVA tenderness Lungs:  Clear with no wheezes HEART:  Regular rate rhythm, no murmurs, no rubs, no clicks Abd:  soft, positive bowel sounds, no organomegally, no rebound, no guarding Ext:  2 plus pulses, no edema, no cyanosis, no  clubbing Skin:  No rashes no nodules Neuro:  CN II through XII intact, motor grossly intact  EKG - NSR with atrial pacing  DEVICE  Normal device function.  See PaceArt for details.   Assess/Plan: 1. Sinus node dysfunction - she is asymptomatic, s/p PPM insertion. 2. PPM - her [employer] DDD PM is working normally. We will recheck  in several months 3. Sob/neck pain - I suspect that she is having anginal symptoms and have prescribed a beta blocker and long acting nitrate imdur. I will see her back in 6-8 weeks. If she has rest symptoms she is recommended to go to the ED. Hopefully her symptoms will improve with medical therapy. 4. HTN - her sbp is well controlled. No change.  Mikle Bosworth.D.

## 2019-07-20 ENCOUNTER — Other Ambulatory Visit: Payer: Self-pay | Admitting: Internal Medicine

## 2019-08-02 ENCOUNTER — Ambulatory Visit (INDEPENDENT_AMBULATORY_CARE_PROVIDER_SITE_OTHER): Payer: Medicare Other | Admitting: *Deleted

## 2019-08-02 DIAGNOSIS — I495 Sick sinus syndrome: Secondary | ICD-10-CM

## 2019-08-02 DIAGNOSIS — I5030 Unspecified diastolic (congestive) heart failure: Secondary | ICD-10-CM | POA: Insufficient documentation

## 2019-08-02 NOTE — Patient Instructions (Addendum)
  Blood work was ordered.     Go downstairs to see Dr Amalia Hailey for evaluation of your right knee pain.     Medications reviewed and updated.  Changes include :   none     Please followup in 6 months

## 2019-08-02 NOTE — Progress Notes (Signed)
Subjective:    Patient ID: [name], female    DOB: [birth year], 85 y.o.   MRN: [id number]  HPI The patient is here for follow up of her chronic medical problems, including GERD, prediabetes, congestive heart failure and chronic kidney disease.  She is by herself.   She is exercising minimally - walking some but limited by SOB and knee pain.    She is taking all of her medications as prescribed.   Right knee pain:  The more she walks the more it hurts.  It started a couple of weeks ago.  She denies any injury.  The pain is on the medial aspect of the knee.  She denies any swelling or redness.She has iced it and applied heat - they help temporarily.  She has tried tylenol.     She is having SOB with activity and saw Dr Lovena Le.  He was concerned about CAD and started BB and Imdur hoping to avoid catheterization.  She has follow up next month.  She is unsure how much improvement there has been because her knee hurts and is limited in her walking.  The shortness of breath improves with rest.  She also experiences pressure in her posterior head and neck tightness with exertion that also improves with rest.  She denies any chest pain.   Medications and allergies reviewed with patient and updated if appropriate.  Patient Active Problem List   Diagnosis Date Noted  . (HFpEF) heart failure with preserved ejection fraction ([place]) 08/02/2019  . Pacemaker 07/19/2019  . CKD (chronic kidney disease) 01/30/2019  . Lumbar spinal stenosis 01/10/2018  . Osteoarthritis of both knees 12/15/2017  . Epigastric pain 09/09/2017  . Neuralgia 09/09/2017  . Claudication of calf muscles ([place]) 11/22/2016  . DOE (dyspnea on exertion) 11/22/2016  . Hyperreflexia of lower extremity 07/16/2016  . Bilateral leg paresthesia 07/16/2016  . Carotid arterial disease ([place]) 07/13/2016  . Poor balance 06/23/2016  . Weakness of both lower extremities 06/23/2016  . Sinus node dysfunction ([place]) 05/05/2016  .  Right shoulder pain 03/14/2016  . Multinodular goiter (nontoxic) 02/21/2016  . Neck pain 02/15/2016  . Pain in limb 02/15/2016  . Prediabetes 12/17/2015  . Dysphagia 12/17/2015  . Anxiety 11/13/2015  . T8 vertebral fracture ([place]) 04/09/2015  . Overweight 10/09/2011  . Allergic rhinitis, cause unspecified   . GERD (gastroesophageal reflux disease) 02/05/2011  . Vitamin B 12 deficiency 04/17/2009  . Hyperlipidemia 04/17/2009  . Osteoporosis 04/17/2009  . ADENOCARCINOMA, BREAST, HX OF 04/17/2009  . CONSTIPATION 03/08/2009  . COLONIC POLYPS, HYPERPLASTIC, HX OF 03/06/2009    Current Outpatient Medications on File Prior to Visit  Medication Sig Dispense Refill  . acetaminophen (TYLENOL) 500 MG tablet Take 500 mg by mouth 2 (two) times daily.    . cholecalciferol (VITAMIN D) 1000 UNITS tablet Take 2,000 Units by mouth daily.     . Cyanocobalamin (RA VITAMIN B-12 TR) 1000 MCG TBCR Take 1 tablet by mouth every other day.     . fluticasone (FLONASE) 50 MCG/ACT nasal spray Place 1 spray into both nostrils daily as needed for allergies.     . furosemide (LASIX) 40 MG tablet TAKE 1 TABLET ONCE DAILY. 90 tablet 3  . gabapentin (NEURONTIN) 100 MG capsule Take 2 capsules (200 mg total) by mouth at bedtime. 180 capsule 3  . isosorbide mononitrate (IMDUR) 30 MG 24 hr tablet Take 1 tablet (30 mg total) by mouth daily. 90 tablet 3  . loratadine (CLARITIN)  10 MG tablet Take 10 mg by mouth daily as needed for allergies.    . metoprolol succinate (TOPROL XL) 25 MG 24 hr tablet Take 1 tablet (25 mg total) by mouth daily. 90 tablet 3  . omeprazole (PRILOSEC) 40 MG capsule TAKE (1) CAPSULE TWICE DAILY. 60 capsule 11  . polyethylene glycol (MIRALAX / GLYCOLAX) packet Take 17 g by mouth daily as needed (constipation).     . potassium chloride SA (KLOR-CON) 20 MEQ tablet TAKE 1 TABLET ONCE DAILY. 90 tablet 3   No current facility-administered medications on file prior to visit.    Past Medical History:   Diagnosis Date  . Cancer of left breast ([place]) 1978  . Chronic thoracic back pain    "T8; fracture; 03/2015; no OR" (05/05/2016)  . GERD (gastroesophageal reflux disease)   . Heart murmur   . History of hiatal hernia   . Hyperlipidemia   . Hyperplastic colon polyp   . IBS (irritable bowel syndrome)   . Multiple thyroid nodules   . Osteoporosis    T8 compression fx 03/2015   . Personal history of radiation therapy   . Presence of permanent cardiac pacemaker   . Vitamin B12 deficiency     Past Surgical History:  Procedure Laterality Date  . ANTERIOR CERVICAL DECOMP/DISCECTOMY FUSION  2008   C5-6  . AUGMENTATION MAMMAPLASTY    . BACK SURGERY    . BREAST SURGERY    . CARDIAC CATHETERIZATION  2001  . EP IMPLANTABLE DEVICE N/A 05/05/2016   Procedure: Pacemaker Implant;  Surgeon: Evans Lance, MD;  Location: [facility];  Service: Cardiovascular;  Laterality: N/A;  . EXCISIONAL HEMORRHOIDECTOMY  1984   With subsequent correction of surgery  . INSERT / REPLACE / REMOVE PACEMAKER    . KNEE ARTHROSCOPY Left    "meniscus tear"  . MASTECTOMY Left 1978  . PLACEMENT OF BREAST IMPLANTS Left 1981  . SHOULDER ARTHROSCOPY W/ ROTATOR CUFF REPAIR Left 02/2006   "tear"  . TUBAL LIGATION      Social History   Socioeconomic History  . Marital status: Married    Spouse name: Not on file  . Number of children: 2  . Years of education: Not on file  . Highest education level: Not on file  Occupational History  . Occupation: retired  Tobacco Use  . Smoking status: Never Smoker  . Smokeless tobacco: Never Used  Substance and Sexual Activity  . Alcohol use: No    Alcohol/week: 0.0 standard drinks  . Drug use: No  . Sexual activity: Not Currently  Other Topics Concern  . Not on file  Social History Narrative   Housewife, Lives with spouse, 2 children   Social Determinants of Health   Financial Resource Strain:   . Difficulty of Paying Living Expenses: Not on file  Food  Insecurity:   . Worried About Charity fundraiser in the Last Year: Not on file  . Ran Out of Food in the Last Year: Not on file  Transportation Needs:   . Lack of Transportation (Medical): Not on file  . Lack of Transportation (Non-Medical): Not on file  Physical Activity:   . Days of Exercise per Week: Not on file  . Minutes of Exercise per Session: Not on file  Stress:   . Feeling of Stress : Not on file  Social Connections:   . Frequency of Communication with Friends and Family: Not on file  . Frequency of Social Gatherings with  Friends and Family: Not on file  . Attends Religious Services: Not on file  . Active Member of Clubs or Organizations: Not on file  . Attends Archivist Meetings: Not on file  . Marital Status: Not on file    Family History  Problem Relation Age of Onset  . Diabetes Sister   . Diabetes Brother   . Breast cancer Maternal Aunt   . Colon cancer Neg Hx     Review of Systems  Constitutional: Negative for chills and fever.  Respiratory: Positive for cough (occ dry cough) and shortness of breath (with exertion). Negative for wheezing.   Cardiovascular: Negative for chest pain, palpitations and leg swelling.  Musculoskeletal: Positive for back pain, neck pain and neck stiffness.  Neurological: Positive for headaches (pressure in posterior head - started a few days ago, when walking - it stops when she rests).       Objective:   Vitals:   08/03/19 0847  BP: (!) 144/62  Pulse: 60  Resp: 18  Temp: 98.2 F (36.8 C)  SpO2: 99%   BP Readings from Last 3 Encounters:  08/03/19 (!) 144/62  07/19/19 122/60  03/02/19 (!) 134/56   Wt Readings from Last 3 Encounters:  08/03/19 164 lb (74.4 kg)  07/19/19 165 lb (74.8 kg)  03/02/19 161 lb (73 kg)   Body mass index is 30 kg/m.   Physical Exam    Constitutional: Appears well-developed and well-nourished. No distress.  HENT:  Head: Normocephalic and atraumatic.  Neck: Neck supple. No  tracheal deviation present. No thyromegaly present.  No cervical lymphadenopathy Cardiovascular: Normal rate, regular rhythm and normal heart sounds.   No murmur heard. No carotid bruit .  No edema Pulmonary/Chest: Effort normal and breath sounds normal. No respiratory distress. No has no wheezes. No rales.  MSK: right knee w/o obvious effusion or swelling, pain is on medial aspect - nontender to palpation - focal area of tenderness medial proximal lower leg, no erythema or warmth Skin: Skin is warm and dry. Not diaphoretic.  Psychiatric: Normal mood and affect. Behavior is normal.      Assessment & Plan:    See Problem List for Assessment and Plan of chronic medical problems.    This visit occurred during the SARS-CoV-2 public health emergency.  Safety protocols were in place, including screening questions prior to the visit, additional usage of staff PPE, and extensive cleaning of exam room while observing appropriate contact time as indicated for disinfecting solutions.

## 2019-08-03 ENCOUNTER — Telehealth: Payer: Self-pay

## 2019-08-03 ENCOUNTER — Ambulatory Visit (INDEPENDENT_AMBULATORY_CARE_PROVIDER_SITE_OTHER): Payer: Medicare Other

## 2019-08-03 ENCOUNTER — Ambulatory Visit (INDEPENDENT_AMBULATORY_CARE_PROVIDER_SITE_OTHER): Payer: Medicare Other | Admitting: Family Medicine

## 2019-08-03 ENCOUNTER — Ambulatory Visit (INDEPENDENT_AMBULATORY_CARE_PROVIDER_SITE_OTHER): Payer: Medicare Other | Admitting: Internal Medicine

## 2019-08-03 ENCOUNTER — Encounter: Payer: Self-pay | Admitting: Internal Medicine

## 2019-08-03 ENCOUNTER — Encounter: Payer: Self-pay | Admitting: Family Medicine

## 2019-08-03 ENCOUNTER — Other Ambulatory Visit: Payer: Self-pay

## 2019-08-03 VITALS — BP 144/62 | HR 60 | Ht 62.0 in | Wt 164.0 lb

## 2019-08-03 VITALS — BP 144/62 | HR 60 | Temp 98.2°F | Resp 18 | Ht 62.0 in | Wt 164.0 lb

## 2019-08-03 DIAGNOSIS — K219 Gastro-esophageal reflux disease without esophagitis: Secondary | ICD-10-CM

## 2019-08-03 DIAGNOSIS — N1831 Chronic kidney disease, stage 3a: Secondary | ICD-10-CM | POA: Diagnosis not present

## 2019-08-03 DIAGNOSIS — M25561 Pain in right knee: Secondary | ICD-10-CM | POA: Diagnosis not present

## 2019-08-03 DIAGNOSIS — R7303 Prediabetes: Secondary | ICD-10-CM | POA: Diagnosis not present

## 2019-08-03 DIAGNOSIS — I5032 Chronic diastolic (congestive) heart failure: Secondary | ICD-10-CM

## 2019-08-03 LAB — COMPREHENSIVE METABOLIC PANEL
ALT: 9 U/L (ref 0–35)
AST: 12 U/L (ref 0–37)
Albumin: 4.1 g/dL (ref 3.5–5.2)
Alkaline Phosphatase: 36 U/L — ABNORMAL LOW (ref 39–117)
BUN: 18 mg/dL (ref 6–23)
CO2: 27 mEq/L (ref 19–32)
Calcium: 8.6 mg/dL (ref 8.4–10.5)
Chloride: 102 mEq/L (ref 96–112)
Creatinine, Ser: 1.11 mg/dL (ref 0.40–1.20)
GFR: 47.03 mL/min — ABNORMAL LOW (ref >60.00)
Glucose, Bld: 107 mg/dL — ABNORMAL HIGH (ref 70–99)
Potassium: 4.7 mEq/L (ref 3.5–5.1)
Sodium: 136 mEq/L (ref 135–145)
Total Bilirubin: 0.3 mg/dL (ref 0.2–1.2)
Total Protein: 6.7 g/dL (ref 6.0–8.3)

## 2019-08-03 LAB — LIPID PANEL
Cholesterol: 266 mg/dL — ABNORMAL HIGH (ref 0–200)
HDL: 58.5 mg/dL (ref >39.00)
LDL Cholesterol: 185 mg/dL — ABNORMAL HIGH (ref 0–99)
NonHDL: 207.04
Total CHOL/HDL Ratio: 5
Triglycerides: 111 mg/dL (ref 0.0–149.0)
VLDL: 22.2 mg/dL (ref 0.0–40.0)

## 2019-08-03 LAB — CUP PACEART REMOTE DEVICE CHECK
Date Time Interrogation Session: 20210106170910
Implantable Lead Implant Date: 20171009
Implantable Lead Implant Date: 20171009
Implantable Lead Location: 753859
Implantable Lead Location: 753860
Implantable Lead Model: 5076
Implantable Lead Model: 7741
Implantable Lead Serial Number: 785430
Implantable Pulse Generator Implant Date: 20171009
Pulse Gen Serial Number: 766467

## 2019-08-03 LAB — CBC WITH DIFFERENTIAL/PLATELET
Basophils Absolute: 0 10*3/uL (ref 0.0–0.1)
Basophils Relative: 0.7 % (ref 0.0–3.0)
Eosinophils Absolute: 0.1 10*3/uL (ref 0.0–0.7)
Eosinophils Relative: 2.4 % (ref 0.0–5.0)
HCT: 32.1 % — ABNORMAL LOW (ref 36.0–46.0)
Hemoglobin: 10.6 g/dL — ABNORMAL LOW (ref 12.0–15.0)
Lymphocytes Relative: 21.2 % (ref 12.0–46.0)
Lymphs Abs: 1.2 10*3/uL (ref 0.7–4.0)
MCHC: 32.9 g/dL (ref 30.0–36.0)
MCV: 96.8 fl (ref 78.0–100.0)
Monocytes Absolute: 0.7 10*3/uL (ref 0.1–1.0)
Monocytes Relative: 11.6 % (ref 3.0–12.0)
Neutro Abs: 3.8 10*3/uL (ref 1.4–7.7)
Neutrophils Relative %: 64.1 % (ref 43.0–77.0)
Platelets: 325 10*3/uL (ref 150.0–400.0)
RBC: 3.31 Mil/uL — ABNORMAL LOW (ref 3.87–5.11)
RDW: 14.1 % (ref 11.5–15.5)
WBC: 5.9 10*3/uL (ref 4.0–10.5)

## 2019-08-03 LAB — HEMOGLOBIN A1C: Hgb A1c MFr Bld: 6.2 % (ref 4.6–6.5)

## 2019-08-03 NOTE — Assessment & Plan Note (Signed)
Referred to sports medicine.

## 2019-08-03 NOTE — Progress Notes (Signed)
Subjective:    I'm seeing this patient as a consultation for:  Dr. Quay Burow. Note will be routed back to referring provider/PCP.  CC: R knee pain  I, Molly Weber, LAT, ATC, am serving as scribe for Dr. Lynne Leader.  HPI: Pt is a 85 y/o female presenting w/ c/o R knee pain x few weeks.  She locates her knee pain to her R ant-med pain.  She rates her pain as a 7-8/10 w/ walking that she describes as aching pain.  She has no pain at rest.  Pain will radiate into her R medial thigh and post knee.  She denies any mechanical symptoms or swelling in the R knee.  She has tried ice and heat and Tylenol w/ no relief.  Past medical history, Surgical history, Family history not pertinant except as noted below, Social history, Allergies, and medications have been entered into the medical record, reviewed, and no changes needed.   Review of Systems: No headache, visual changes, nausea, vomiting, diarrhea, constipation, dizziness, abdominal pain, skin rash, fevers, chills, night sweats, weight loss, swollen lymph nodes, body aches, joint swelling, muscle aches, chest pain, shortness of breath, mood changes, visual or auditory hallucinations.   Objective:    Vitals:   08/03/19 1003  BP: (!) 144/62  Pulse: 60  SpO2: 99%   General: Well Developed, well nourished, and in no acute distress.  Neuro/Psych: Alert and oriented x3, extra-ocular muscles intact, able to move all 4 extremities, sensation grossly intact. Skin: Warm and dry, no rashes noted.  Respiratory: Not using accessory muscles, speaking in full sentences, trachea midline.  Cardiovascular: Pulses palpable, no extremity edema. Abdomen: Does not appear distended. MSK: Right knee: Mild soft tissue swelling overlying proximal medial tibia otherwise knee normal-appearing with no erythema. Range of motion 0-120 degrees with mild crepitation. Tender palpation at medial joint line and proximal anterior medial tibia. Some guarding with stability  testing no obvious laxity however chest x-ray reduced due to the guarding. Again guarding with McMurray's testing nondiagnostic.  No obvious pop. Intact flexion extension strength.  Left knee normal-appearing normal motion nontender.  Antalgic gait.  Lab and Radiology Result  X-ray images right knee obtained today personally and independently reviewed No acute fractures.  Moderate medial compartment DJD. Await formal radiology review  Limited musculoskeletal ultrasound right knee: Intact quad tendon with no significant effusion. Intact normal-appearing patellar tendon. Normal lateral joint line and lateral meniscus. Moderately narrowed medial joint line however no obvious medial meniscus tear. Slight thickening of Pes anserine bursa at proximal medial anterior tibia. Impression: DJD and pes anserine bursitis  Procedure: Real-time Ultrasound Guided Injection of right knee pes anserine bursa Device: Philips Affiniti 50G Images permanently stored and available for review in the ultrasound unit. Verbal informed consent obtained.  Discussed risks and benefits of procedure. Warned about infection bleeding damage to structures skin hypopigmentation and fat atrophy among others. Patient expresses understanding and agreement Time-out conducted.   Noted no overlying erythema, induration, or other signs of local infection.   Skin prepped in a sterile fashion.   Local anesthesia: Topical Ethyl chloride.   With sterile technique and under real time ultrasound guidance:  40 mg of Kenalog and 4 mL of Marcaine injected easily.   Completed without difficulty   Pain immediately resolved suggesting accurate placement of the medication.   Advised to call if fevers/chills, erythema, induration, drainage, or persistent bleeding.   Images permanently stored and available for review in the ultrasound unit.  Impression: Technically successful  ultrasound guided injection.         Impression and  Recommendations:    Assessment and Plan: 85 y.o. female with right knee pain.  Multifactorial however dominant pain secondary to pes anserine bursitis.  Patient also has a component of do digit that is also contributing to pain.  Discussed treatment plan.  Plan for topical diclofenac gel and Pes anserine bursitis injection.  Recheck back in 1 month.  Return sooner if needed.  Precautions reviewed with patient expresses understanding and agreement.   Orders Placed This Encounter  Procedures  . [place] - LOWER Extremity - Limited - RIGHT    Order Specific Question:   Reason for Exam (SYMPTOM  OR DIAGNOSIS REQUIRED)    Answer:   R knee pain    Order Specific Question:   Preferred imaging location?    Answer:   [facility]  . DG Knee 4 Views W/Patella Right    Standing Status:   Future    Standing Expiration Date:   09/30/2020    Order Specific Question:   Reason for Exam (SYMPTOM  OR DIAGNOSIS REQUIRED)    Answer:   knee pain    Order Specific Question:   Preferred imaging location?    Answer:   [facility]    Order Specific Question:   Radiology Contrast Protocol - do NOT remove file path    Answer:   [email or web address]   No orders of the defined types were placed in this encounter.   Discussed warning signs or symptoms. Please see discharge instructions. Patient expresses understanding.   The above documentation has been reviewed and is accurate and complete Lynne Leader

## 2019-08-03 NOTE — Telephone Encounter (Signed)
Left message for patient to remind of missed remote transmission.  

## 2019-08-03 NOTE — Assessment & Plan Note (Signed)
Chronic Check a1c Low sugar / carb diet Stressed regular exercise  

## 2019-08-03 NOTE — Telephone Encounter (Signed)
The pt someone called about missing a transmission. I looked and her monitor is not communicating. I gave her the number to [employer] tech support to get additional help.

## 2019-08-03 NOTE — Assessment & Plan Note (Signed)
Chronic, stable, controlled Continue omeprazole daily

## 2019-08-03 NOTE — Assessment & Plan Note (Addendum)
Chronic, stable Euvolemic on exam Following with cardiology Continue current medications CMP, CBC, lipids

## 2019-08-03 NOTE — Assessment & Plan Note (Signed)
Chronic CMP today Tylenol as needed-does not take NSAIDs

## 2019-08-03 NOTE — Progress Notes (Signed)
X-ray right knee shows no fracture.  Radiology agrees with my interpretation that we discussed in clinic.

## 2019-08-03 NOTE — Patient Instructions (Signed)
Thank you for coming in today. Call or go to the ER if you develop a large red swollen joint with extreme pain or oozing puss.  Recheck with me in about 1 month.  Return sooner if needed.    Pes Anserine Bursitis  The pes anserine is an area on the inside of your knee, just below the joint, that is cushioned by a fluid-filled sac (bursa). Pes anserine bursitis is a condition that happens when the bursa gets swollen and irritated. The condition causes knee pain. What are the causes? This condition may be caused by:  Making the same movement over and over.  A direct hit (trauma) to the inside of the leg. What increases the risk? You are more likely to develop this condition if you:  Are a runner.  Play sports that involve a lot of running and quick side-to-side movements (cutting).  Are an athlete who plays contact sports.  Swim using an inward angle of the knee, such as with the breaststroke.  Have tight hamstring muscles.  Are a woman.  Are overweight.  Have flat feet.  Have diabetes or osteoarthritis. What are the signs or symptoms? Symptoms of this condition include:  Knee pain that gets better with rest and worse with activities like climbing stairs, walking, running, or getting in and out of a chair.  Swelling.  Warmth.  Tenderness when pressing at the inside of the lower leg, just below the knee. How is this diagnosed? This condition may be diagnosed based on:  Your symptoms.  Your medical history.  A physical exam. ? During your physical exam, your health care provider will press on the tendon attachment to see if you feel pain. ? Your health care provider may also check your hip and knee motion and strength.  Tests to check for swelling and fluid buildup in the bursa and to look at muscles, bones, and tendons. These tests might include: ? X-rays. ? MRI. ? Ultrasound. How is this treated? This condition may be treated by:  Resting your knee. You  may be told to raise (elevate) your knee while resting.  Avoiding activities that cause pain.  Icing the inside of your knee.  Sleeping with a pillow between your knees. This will cushion your injured knee.  Taking medicine by mouth (orally) to reduce pain and swelling or having medicine injected into your knee.  Doing strengthening and stretching exercises (physical therapy). If these treatments do not work or if the condition keeps coming back, you may need to have surgery to remove the bursa. Follow these instructions at home: Managing pain, stiffness, and swelling   If directed, put ice on the injured area. ? Put ice in a plastic bag. ? Place a towel between your skin and the bag. ? Leave the ice on for 20 minutes, 2-3 times a day.  Elevate the injured area above the level of your heart while you are sitting or lying down. Activity  Return to your normal activities as told by your health care provider. Ask your health care provider what activities are safe for you.  Do exercises as told by your health care provider. General instructions  Take over-the-counter and prescription medicines only as told by your health care provider.  Sleep with a pillow between your knees.  Do not use any products that contain nicotine or tobacco, such as cigarettes, e-cigarettes, and chewing tobacco. These can delay healing. If you need help quitting, ask your health care provider.  If you  are overweight, work with your health care provider and a dietitian to set a weight-loss goal that is healthy and reasonable for you.  Keep all follow-up visits as told by your health care provider. This is important. How is this prevented?  When exercising, make sure that you: ? Warm up and stretch before being active. ? Cool down and stretch after being active. ? Give your body time to rest between periods of activity. ? Use equipment that fits you. ? Are safe and responsible while being active to  avoid falls. ? Do at least 150 minutes of moderate-intensity exercise each week, such as brisk walking or water aerobics. ? Maintain physical fitness, including:  Strength.  Flexibility.  Cardiovascular fitness.  Endurance. ? Maintain a healthy weight. Contact a health care provider if:  Your symptoms do not improve.  Your symptoms get worse. Summary  Pes anserine bursitis is a condition that happens when the fluid-filled sac (bursa) at the inside of your knee gets swollen and irritated. The condition causes knee pain.  Treatment for pes anserine bursitis may include resting your knee, icing the inside of your knee, sleeping with a pillow between your knees, taking medicine by mouth or by injection, and doing strengthening and stretching exercises (physical therapy).  Follow instructions for managing pain, stiffness, and swelling.  Take over-the-counter and prescription medicines only as told by your health care provider. This information is not intended to replace advice given to you by your health care provider. Make sure you discuss any questions you have with your health care provider. Document Revised: 11/04/2018 Document Reviewed: 12/23/2017 Elsevier Patient Education  [place].

## 2019-08-05 ENCOUNTER — Other Ambulatory Visit (INDEPENDENT_AMBULATORY_CARE_PROVIDER_SITE_OTHER): Payer: Medicare Other

## 2019-08-05 DIAGNOSIS — D649 Anemia, unspecified: Secondary | ICD-10-CM | POA: Diagnosis not present

## 2019-08-05 LAB — IBC + FERRITIN
Ferritin: 8.8 ng/mL — ABNORMAL LOW (ref 10.0–291.0)
Iron: 49 ug/dL (ref 42–145)
Saturation Ratios: 12.4 % — ABNORMAL LOW (ref 20.0–50.0)
Transferrin: 283 mg/dL (ref 212.0–360.0)

## 2019-08-06 ENCOUNTER — Other Ambulatory Visit: Payer: Self-pay | Admitting: Internal Medicine

## 2019-08-06 MED ORDER — EZETIMIBE 10 MG PO TABS: 10.0000 mg | ORAL_TABLET | Freq: Every day | ORAL | 3 refills | Status: DC

## 2019-08-09 ENCOUNTER — Telehealth: Payer: Self-pay

## 2019-08-09 NOTE — Telephone Encounter (Signed)
Routing to dr burns, patient picked up stool cards today----wanting to know if she should begin taking OTC iron supplements, if yes, what dosage?---please advise, I will call patient back, thanks

## 2019-08-09 NOTE — Telephone Encounter (Signed)
Okay to start an over-the-counter iron pill daily

## 2019-08-10 NOTE — Telephone Encounter (Signed)
Patient advised of dr burns note/insructions, I have suggested to start off with 60mg  iron tablet with food, just to make sure she tolerates well, ok to slowly increase to about 150mg  daily if well toleratied, also gave food choices high in iron to include in diet---patient will call back if any further questions

## 2019-08-12 ENCOUNTER — Other Ambulatory Visit: Payer: Medicare Other

## 2019-08-15 ENCOUNTER — Other Ambulatory Visit: Payer: Self-pay

## 2019-08-15 ENCOUNTER — Telehealth: Payer: Self-pay | Admitting: Internal Medicine

## 2019-08-15 ENCOUNTER — Other Ambulatory Visit (INDEPENDENT_AMBULATORY_CARE_PROVIDER_SITE_OTHER): Payer: Medicare Other

## 2019-08-15 DIAGNOSIS — D649 Anemia, unspecified: Secondary | ICD-10-CM | POA: Diagnosis not present

## 2019-08-15 LAB — FECAL OCCULT BLOOD, IMMUNOCHEMICAL: Fecal Occult Bld: NEGATIVE

## 2019-08-15 NOTE — Telephone Encounter (Signed)
Pt aware of results 

## 2019-08-15 NOTE — Addendum Note (Signed)
Addended by: Delice Bison E on: 08/15/2019 08:52 AM   Modules accepted: Orders

## 2019-08-15 NOTE — Telephone Encounter (Signed)
Copied from [place] [id number]. Topic: General - Other >> Aug 15, 2019  3:33 PM Keene Breath wrote: Reason for CRM: Patient is returning a call To [facility].  CB# [phone]

## 2019-08-31 ENCOUNTER — Encounter: Payer: Self-pay | Admitting: Family Medicine

## 2019-08-31 ENCOUNTER — Ambulatory Visit (INDEPENDENT_AMBULATORY_CARE_PROVIDER_SITE_OTHER): Payer: Medicare Other | Admitting: Family Medicine

## 2019-08-31 ENCOUNTER — Other Ambulatory Visit: Payer: Self-pay

## 2019-08-31 VITALS — BP 158/70 | HR 60 | Ht 62.0 in | Wt 164.0 lb

## 2019-08-31 DIAGNOSIS — Z7409 Other reduced mobility: Secondary | ICD-10-CM | POA: Diagnosis not present

## 2019-08-31 DIAGNOSIS — M25561 Pain in right knee: Secondary | ICD-10-CM | POA: Diagnosis not present

## 2019-08-31 NOTE — Progress Notes (Signed)
   I, Wendy Poet, LAT, ATC, am serving as scribe for Dr. Lynne Leader.  [name] is a 85 y.o. female who presents to [place] at [facility] today for f/u of R anterior-medial knee pain.  She was last seen by Dr. Georgina Snell on 08/03/19 and had a R knee injection.  At that time she c/o 7-8/10 pain and denied any mechanical symptoms or knee swelling.  Since her last visit, pt reports improvement in her R ant-med knee pain and reports approximately 80% improvement.  Pt states that she has been doing PT at [facility] assisted living.  She is been getting physical therapy as part of a [place] initiative for health at home.  She does not have a formal order from a physician for this.  Pt reports pain mainly w/ weight bearing activity.  Diagnostic testing: R knee X-ray - 08/03/19  Pertinent review of systems: No fevers or chills.  She notes that sometimes she feels a bit unbalanced.  Relevant historical information: Heart failure, CKD,   Exam:  BP (!) 158/70 (BP Location: Left Arm, Patient Position: Sitting, Cuff Size: Normal)   Pulse 60   Ht 5\' 2"  (1.575 m)   Wt 164 lb (74.4 kg)   SpO2 98%   BMI 30.00 kg/m  General: Well Developed, well nourished, and in no acute distress.   MSK: Right knee: Appearance with no significant swelling. Nontender.  Normal motion.  Intact strength.  Patient is able to stand and ambulate. She does do some furniture walking with gait.    Lab and Radiology Results EXAM: RIGHT KNEE - COMPLETE 4+ VIEW  COMPARISON:  None.  FINDINGS: No evidence of fracture, dislocation, or joint effusion. No evidence of arthropathy or other focal bone abnormality. Soft tissues are unremarkable.  IMPRESSION: Negative.   Electronically Signed   By: Franchot Gallo M.D.   On: 08/03/2019 10:56 I, Lynne Leader, personally (independently) visualized and performed the interpretation of the images attached in this note.     Assessment and Plan: 85  y.o. female with right knee pain.  Mostly improved following injection at [facility].  She does continue to have a bit of pain that is probably attributable to DJD.  I do think that the physical therapy that she has been getting is a great idea.  I will formalized physical therapy with an order today.  Discussed that if her pain is reasonably controlled and she is happy with how things are no need for further injections or work-up.  However certainly reasonable to return to clinic if needed for repeat evaluation and potentially injection knee.  Additionally will include balance and gait training as part of physical therapy.   PDMP not reviewed this encounter. Orders Placed This Encounter  Procedures  . Ambulatory referral to Home Health    Referral Priority:   Routine    Referral Type:   Home Health Care    Referral Reason:   Specialty Services Required    Requested Specialty:   [place]    Number of Visits Requested:   1   No orders of the defined types were placed in this encounter.    Discussed warning signs or symptoms. Please see discharge instructions. Patient expresses understanding.   The above documentation has been reviewed and is accurate and complete Lynne Leader

## 2019-08-31 NOTE — Patient Instructions (Addendum)
Thank you for coming in today. Try using over the counter voltaren gel or regular aspercream on the knee. Recheck with me as needed.  I am happy to do more if not doing well.

## 2019-09-10 ENCOUNTER — Ambulatory Visit: Payer: Medicare Other | Attending: Internal Medicine

## 2019-09-10 DIAGNOSIS — Z23 Encounter for immunization: Secondary | ICD-10-CM | POA: Insufficient documentation

## 2019-09-10 NOTE — Progress Notes (Signed)
   Covid-19 Vaccination Clinic  Name:  SARABI LINCK    MRN: [id number] DOB: 18-Jul-1935  09/10/2019  Ms. Purgason was observed post Covid-19 immunization for 15 minutes without incidence. She was provided with Vaccine Information Sheet and instruction to access the V-Safe system.   Ms. Paice was instructed to call 911 with any severe reactions post vaccine: [name] Difficulty breathing  . Swelling of your face and throat  . A fast heartbeat  . A bad rash all over your body  . Dizziness and weakness    Immunizations Administered    Name Date Dose VIS Date Route   Pfizer COVID-19 Vaccine 09/10/2019  2:38 PM 0.3 mL 07/08/2019 Intramuscular   Manufacturer: [place]   Lot: [id number]   [place]: [id number]

## 2019-09-19 ENCOUNTER — Telehealth: Payer: Self-pay | Admitting: Internal Medicine

## 2019-09-19 ENCOUNTER — Encounter: Payer: Medicare Other | Admitting: Internal Medicine

## 2019-09-19 NOTE — Telephone Encounter (Signed)
REturned call to Pt.  Advised ok for husband to accompany her/fall risk.

## 2019-09-19 NOTE — Telephone Encounter (Signed)
Patient is calling requesting her husband attend her upcoming appointment scheduled for 09/20/19 due to her easily losing balance and him assisting her with walking due to it. Please advise.

## 2019-09-20 ENCOUNTER — Other Ambulatory Visit: Payer: Self-pay

## 2019-09-20 ENCOUNTER — Encounter: Payer: Self-pay | Admitting: Internal Medicine

## 2019-09-20 ENCOUNTER — Ambulatory Visit (INDEPENDENT_AMBULATORY_CARE_PROVIDER_SITE_OTHER): Payer: Medicare Other | Admitting: Internal Medicine

## 2019-09-20 VITALS — BP 126/64 | HR 62 | Ht 62.0 in | Wt 159.0 lb

## 2019-09-20 DIAGNOSIS — I5032 Chronic diastolic (congestive) heart failure: Secondary | ICD-10-CM

## 2019-09-20 DIAGNOSIS — I10 Essential (primary) hypertension: Secondary | ICD-10-CM | POA: Diagnosis not present

## 2019-09-20 DIAGNOSIS — I495 Sick sinus syndrome: Secondary | ICD-10-CM | POA: Diagnosis not present

## 2019-09-20 DIAGNOSIS — R079 Chest pain, unspecified: Secondary | ICD-10-CM

## 2019-09-20 DIAGNOSIS — Z950 Presence of cardiac pacemaker: Secondary | ICD-10-CM | POA: Diagnosis not present

## 2019-09-20 LAB — CUP PACEART INCLINIC DEVICE CHECK
Date Time Interrogation Session: 20210223000000
Implantable Lead Implant Date: 20171009
Implantable Lead Implant Date: 20171009
Implantable Lead Location: 753859
Implantable Lead Location: 753860
Implantable Lead Model: 5076
Implantable Lead Model: 7741
Implantable Lead Serial Number: 785430
Implantable Pulse Generator Implant Date: 20171009
Lead Channel Impedance Value: 370 Ohm
Lead Channel Impedance Value: 584 Ohm
Lead Channel Pacing Threshold Amplitude: 0.6 V
Lead Channel Pacing Threshold Amplitude: 1.3 V
Lead Channel Pacing Threshold Pulse Width: 0.4 ms
Lead Channel Pacing Threshold Pulse Width: 1 ms
Lead Channel Sensing Intrinsic Amplitude: 10 mV
Lead Channel Setting Pacing Amplitude: 2 V
Lead Channel Setting Pacing Amplitude: 2.5 V
Lead Channel Setting Pacing Pulse Width: 0.4 ms
Lead Channel Setting Sensing Sensitivity: 2.5 mV
Pulse Gen Serial Number: 766467

## 2019-09-20 NOTE — Patient Instructions (Addendum)
Medication Instructions:  Your physician recommends that you continue on your current medications as directed. Please refer to the Current Medication list given to you today.  Labwork: You will get lab work today:  BMP and CBC  Testing/Procedures: None ordered.  Follow-Up:  Your physician wants you to follow-up in: based on results of your cardiac catheterization.  Any Other Special Instructions Will Be Listed Below (If Applicable).  If you need a refill on your cardiac medications before your next appointment, please call your pharmacy.      [facility] [place], [address] [place] [address] Dept: [phone] Loc: [phone]  [name]  09/20/2019   COVID TEST-scheduled at the [facility] on October 04, 2019 at 9:30 am.  This is a drive thru test.  Let them know you are there for a SURGERY covid test.  You are scheduled for a Cardiac Catheterization on Friday, March 12 with Dr. Larae Grooms.  1. Please arrive at the [facility] (Main Entrance A) at [facility]: [address] [place], [place] [address] at 5:30 AM (This time is two hours before your procedure to ensure your preparation). Free valet parking service is available.   Special note: Every effort is made to have your procedure done on time. Please understand that emergencies sometimes delay scheduled procedures.  2. Diet: Do not eat solid foods after midnight.  The patient may have clear liquids until 5am upon the day of the procedure.  3. Labs: You will need to have blood drawn on TODAY  4. Medication instructions in preparation for your procedure:  On the morning of your procedure, take your ASPIRIN 81 MG.  You may use sips of water.  5. Plan for one night stay BUT you may be discharged after your procedure.  You will need someone to drive you home. 6. Bring a current list of your  medications and current insurance cards. 7. You MUST have a responsible person to drive you home. 8. Someone MUST be with you the first 24 hours after you arrive home or your discharge will be delayed. 9. Please wear clothes that are easy to get on and off and wear slip-on shoes.  Thank you for allowing [place] to care for you!   -- [place] [facility]

## 2019-09-20 NOTE — H&P (View-Only) (Signed)
HPI [name] returns today for followup. I saw her several weeks ago and she was having atypical anginal symptoms characterized by pain in the neck and back when she walked, going away when she rested. She also has some arthritic problems including spinal stenosis. She has become more sedentary but she still likes to get around with her walker. She has had some improvement with the beta blocker and imdur but her symptoms still persist. They are clearly related to exertion. No syncope.  Allergies  Allergen Reactions  . Lipitor [Atorvastatin]     Lipitor caused hospitalization - depletion of electrolytes, fever, nausea, loss of appetite, couldn't get out of bed  . Statins     Lipitor caused hospitalization - - depletion of electrolytes, fever, nausea, loss of appetite, couldn't get out of bed  . Cymbalta [Duloxetine Hcl]     Dulled her too much, difficulty urinating, change in vision  . Fosamax [Alendronate Sodium]     Caused chronic issues swallowing  . Lyrica [Pregabalin]     Unknown  . Neurontin [Gabapentin] Other (See Comments)    Dizziness and sedation  . Zanaflex [Tizanidine] Other (See Comments)    Could not sleep     Current Outpatient Medications  Medication Sig Dispense Refill  . acetaminophen (TYLENOL) 500 MG tablet Take 500 mg by mouth 2 (two) times daily.    . cholecalciferol (VITAMIN D) 1000 UNITS tablet Take 2,000 Units by mouth daily.     . Cyanocobalamin (RA VITAMIN B-12 TR) 1000 MCG TBCR Take 1 tablet by mouth every other day.     . ezetimibe (ZETIA) 10 MG tablet Take 1 tablet (10 mg total) by mouth daily. 90 tablet 3  . fluticasone (FLONASE) 50 MCG/ACT nasal spray Place 1 spray into both nostrils daily as needed for allergies.     . furosemide (LASIX) 40 MG tablet TAKE 1 TABLET ONCE DAILY. 90 tablet 3  . gabapentin (NEURONTIN) 100 MG capsule Take 2 capsules (200 mg total) by mouth at bedtime. 180 capsule 3  . isosorbide mononitrate (IMDUR) 30 MG 24 hr  tablet Take 1 tablet (30 mg total) by mouth daily. 90 tablet 3  . loratadine (CLARITIN) 10 MG tablet Take 10 mg by mouth daily as needed for allergies.    . metoprolol succinate (TOPROL XL) 25 MG 24 hr tablet Take 1 tablet (25 mg total) by mouth daily. 90 tablet 3  . omeprazole (PRILOSEC) 40 MG capsule TAKE (1) CAPSULE TWICE DAILY. 60 capsule 11  . polyethylene glycol (MIRALAX / GLYCOLAX) packet Take 17 g by mouth daily as needed (constipation).     . potassium chloride SA (KLOR-CON) 20 MEQ tablet TAKE 1 TABLET ONCE DAILY. 90 tablet 3   No current facility-administered medications for this visit.     Past Medical History:  Diagnosis Date  . Cancer of left breast ([place]) 1978  . Chronic thoracic back pain    "T8; fracture; 03/2015; no OR" (05/05/2016)  . GERD (gastroesophageal reflux disease)   . Heart murmur   . History of hiatal hernia   . Hyperlipidemia   . Hyperplastic colon polyp   . IBS (irritable bowel syndrome)   . Multiple thyroid nodules   . Osteoporosis    T8 compression fx 03/2015   . Personal history of radiation therapy   . Presence of permanent cardiac pacemaker   . Vitamin B12 deficiency     ROS:   All systems reviewed and negative except as  noted in the HPI.   Past Surgical History:  Procedure Laterality Date  . ANTERIOR CERVICAL DECOMP/DISCECTOMY FUSION  2008   C5-6  . AUGMENTATION MAMMAPLASTY    . BACK SURGERY    . BREAST SURGERY    . CARDIAC CATHETERIZATION  2001  . EP IMPLANTABLE DEVICE N/A 05/05/2016   Procedure: Pacemaker Implant;  Surgeon: Evans Lance, MD;  Location: [facility];  Service: Cardiovascular;  Laterality: N/A;  . EXCISIONAL HEMORRHOIDECTOMY  1984   With subsequent correction of surgery  . INSERT / REPLACE / REMOVE PACEMAKER    . KNEE ARTHROSCOPY Left    "meniscus tear"  . MASTECTOMY Left 1978  . PLACEMENT OF BREAST IMPLANTS Left 1981  . SHOULDER ARTHROSCOPY W/ ROTATOR CUFF REPAIR Left 02/2006   "tear"  . TUBAL LIGATION        Family History  Problem Relation Age of Onset  . Diabetes Sister   . Diabetes Brother   . Breast cancer Maternal Aunt   . Colon cancer Neg Hx      Social History   Socioeconomic History  . Marital status: Married    Spouse name: Not on file  . Number of children: 2  . Years of education: Not on file  . Highest education level: Not on file  Occupational History  . Occupation: retired  Tobacco Use  . Smoking status: Never Smoker  . Smokeless tobacco: Never Used  Substance and Sexual Activity  . Alcohol use: No    Alcohol/week: 0.0 standard drinks  . Drug use: No  . Sexual activity: Not Currently  Other Topics Concern  . Not on file  Social History Narrative   Housewife, Lives with spouse, 2 children   Social Determinants of Health   Financial Resource Strain:   . Difficulty of Paying Living Expenses: Not on file  Food Insecurity:   . Worried About Charity fundraiser in the Last Year: Not on file  . Ran Out of Food in the Last Year: Not on file  Transportation Needs:   . Lack of Transportation (Medical): Not on file  . Lack of Transportation (Non-Medical): Not on file  Physical Activity:   . Days of Exercise per Week: Not on file  . Minutes of Exercise per Session: Not on file  Stress:   . Feeling of Stress : Not on file  Social Connections:   . Frequency of Communication with Friends and Family: Not on file  . Frequency of Social Gatherings with Friends and Family: Not on file  . Attends Religious Services: Not on file  . Active Member of Clubs or Organizations: Not on file  . Attends Archivist Meetings: Not on file  . Marital Status: Not on file  Intimate Partner Violence:   . Fear of Current or Ex-Partner: Not on file  . Emotionally Abused: Not on file  . Physically Abused: Not on file  . Sexually Abused: Not on file     BP 126/64   Pulse 62   Ht 5\' 2"  (1.575 m)   Wt 159 lb (72.1 kg)   SpO2 96%   BMI 29.08 kg/m   Physical  Exam:  Well appearing NAD HEENT: Unremarkable Neck:  No JVD, no thyromegally Lymphatics:  No adenopathy Back:  No CVA tenderness Lungs:  Clear HEART:  Regular rate rhythm, no murmurs, no rubs, no clicks Abd:  soft, positive bowel sounds, no organomegally, no rebound, no guarding Ext:  2 plus pulses, no  edema, no cyanosis, no clubbing Skin:  No rashes no nodules Neuro:  CN II through XII intact, motor grossly intact  EKG - none  DEVICE  Normal device function.  See PaceArt for details.   Assess/Plan: 1. Crescendo angina - her symptoms remain and she is on maximal medical therapy. Her bp has been around 100-110 at home so we cannot uptitrate her meds. I have offered her left heart cath and she wishes to proceed in hopes of achieving improvement of her symptoms. I cannot definitively say that this is not related to her arthritis but her symptoms only occur with exertion and always resolve with rest. 2. HTN - her bp is much improved on anti-anginal therapy. 3. PPM - her [facility] sci DDD PM is working normally.  Mikle Bosworth.D.

## 2019-09-20 NOTE — Progress Notes (Signed)
HPI [name] returns today for followup. I saw her several weeks ago and she was having atypical anginal symptoms characterized by pain in the neck and back when she walked, going away when she rested. She also has some arthritic problems including spinal stenosis. She has become more sedentary but she still likes to get around with her walker. She has had some improvement with the beta blocker and imdur but her symptoms still persist. They are clearly related to exertion. No syncope.  Allergies  Allergen Reactions  . Lipitor [Atorvastatin]     Lipitor caused hospitalization - depletion of electrolytes, fever, nausea, loss of appetite, couldn't get out of bed  . Statins     Lipitor caused hospitalization - - depletion of electrolytes, fever, nausea, loss of appetite, couldn't get out of bed  . Cymbalta [Duloxetine Hcl]     Dulled her too much, difficulty urinating, change in vision  . Fosamax [Alendronate Sodium]     Caused chronic issues swallowing  . Lyrica [Pregabalin]     Unknown  . Neurontin [Gabapentin] Other (See Comments)    Dizziness and sedation  . Zanaflex [Tizanidine] Other (See Comments)    Could not sleep     Current Outpatient Medications  Medication Sig Dispense Refill  . acetaminophen (TYLENOL) 500 MG tablet Take 500 mg by mouth 2 (two) times daily.    . cholecalciferol (VITAMIN D) 1000 UNITS tablet Take 2,000 Units by mouth daily.     . Cyanocobalamin (RA VITAMIN B-12 TR) 1000 MCG TBCR Take 1 tablet by mouth every other day.     . ezetimibe (ZETIA) 10 MG tablet Take 1 tablet (10 mg total) by mouth daily. 90 tablet 3  . fluticasone (FLONASE) 50 MCG/ACT nasal spray Place 1 spray into both nostrils daily as needed for allergies.     . furosemide (LASIX) 40 MG tablet TAKE 1 TABLET ONCE DAILY. 90 tablet 3  . gabapentin (NEURONTIN) 100 MG capsule Take 2 capsules (200 mg total) by mouth at bedtime. 180 capsule 3  . isosorbide mononitrate (IMDUR) 30 MG 24 hr  tablet Take 1 tablet (30 mg total) by mouth daily. 90 tablet 3  . loratadine (CLARITIN) 10 MG tablet Take 10 mg by mouth daily as needed for allergies.    . metoprolol succinate (TOPROL XL) 25 MG 24 hr tablet Take 1 tablet (25 mg total) by mouth daily. 90 tablet 3  . omeprazole (PRILOSEC) 40 MG capsule TAKE (1) CAPSULE TWICE DAILY. 60 capsule 11  . polyethylene glycol (MIRALAX / GLYCOLAX) packet Take 17 g by mouth daily as needed (constipation).     . potassium chloride SA (KLOR-CON) 20 MEQ tablet TAKE 1 TABLET ONCE DAILY. 90 tablet 3   No current facility-administered medications for this visit.     Past Medical History:  Diagnosis Date  . Cancer of left breast ([place]) 1978  . Chronic thoracic back pain    "T8; fracture; 03/2015; no OR" (05/05/2016)  . GERD (gastroesophageal reflux disease)   . Heart murmur   . History of hiatal hernia   . Hyperlipidemia   . Hyperplastic colon polyp   . IBS (irritable bowel syndrome)   . Multiple thyroid nodules   . Osteoporosis    T8 compression fx 03/2015   . Personal history of radiation therapy   . Presence of permanent cardiac pacemaker   . Vitamin B12 deficiency     ROS:   All systems reviewed and negative except as  noted in the HPI.   Past Surgical History:  Procedure Laterality Date  . ANTERIOR CERVICAL DECOMP/DISCECTOMY FUSION  2008   C5-6  . AUGMENTATION MAMMAPLASTY    . BACK SURGERY    . BREAST SURGERY    . CARDIAC CATHETERIZATION  2001  . EP IMPLANTABLE DEVICE N/A 05/05/2016   Procedure: Pacemaker Implant;  Surgeon: Evans Lance, MD;  Location: [place] [facility];  Service: Cardiovascular;  Laterality: N/A;  . EXCISIONAL HEMORRHOIDECTOMY  1984   With subsequent correction of surgery  . INSERT / REPLACE / REMOVE PACEMAKER    . KNEE ARTHROSCOPY Left    "meniscus tear"  . MASTECTOMY Left 1978  . PLACEMENT OF BREAST IMPLANTS Left 1981  . SHOULDER ARTHROSCOPY W/ ROTATOR CUFF REPAIR Left 02/2006   "tear"  . TUBAL LIGATION        Family History  Problem Relation Age of Onset  . Diabetes Sister   . Diabetes Brother   . Breast cancer Maternal Aunt   . Colon cancer Neg Hx      Social History   Socioeconomic History  . Marital status: Married    Spouse name: Not on file  . Number of children: 2  . Years of education: Not on file  . Highest education level: Not on file  Occupational History  . Occupation: retired  Tobacco Use  . Smoking status: Never Smoker  . Smokeless tobacco: Never Used  Substance and Sexual Activity  . Alcohol use: No    Alcohol/week: 0.0 standard drinks  . Drug use: No  . Sexual activity: Not Currently  Other Topics Concern  . Not on file  Social History Narrative   Housewife, Lives with spouse, 2 children   Social Determinants of Health   Financial Resource Strain:   . Difficulty of Paying Living Expenses: Not on file  Food Insecurity:   . Worried About Charity fundraiser in the Last Year: Not on file  . Ran Out of Food in the Last Year: Not on file  Transportation Needs:   . Lack of Transportation (Medical): Not on file  . Lack of Transportation (Non-Medical): Not on file  Physical Activity:   . Days of Exercise per Week: Not on file  . Minutes of Exercise per Session: Not on file  Stress:   . Feeling of Stress : Not on file  Social Connections:   . Frequency of Communication with Friends and Family: Not on file  . Frequency of Social Gatherings with Friends and Family: Not on file  . Attends Religious Services: Not on file  . Active Member of Clubs or Organizations: Not on file  . Attends Archivist Meetings: Not on file  . Marital Status: Not on file  Intimate Partner Violence:   . Fear of Current or Ex-Partner: Not on file  . Emotionally Abused: Not on file  . Physically Abused: Not on file  . Sexually Abused: Not on file     BP 126/64   Pulse 62   Ht 5\' 2"  (1.575 m)   Wt 159 lb (72.1 kg)   SpO2 96%   BMI 29.08 kg/m   Physical  Exam:  Well appearing NAD HEENT: Unremarkable Neck:  No JVD, no thyromegally Lymphatics:  No adenopathy Back:  No CVA tenderness Lungs:  Clear HEART:  Regular rate rhythm, no murmurs, no rubs, no clicks Abd:  soft, positive bowel sounds, no organomegally, no rebound, no guarding Ext:  2 plus pulses, no  edema, no cyanosis, no clubbing Skin:  No rashes no nodules Neuro:  CN II through XII intact, motor grossly intact  EKG - none  DEVICE  Normal device function.  See PaceArt for details.   Assess/Plan: 1. Crescendo angina - her symptoms remain and she is on maximal medical therapy. Her bp has been around 100-110 at home so we cannot uptitrate her meds. I have offered her left heart cath and she wishes to proceed in hopes of achieving improvement of her symptoms. I cannot definitively say that this is not related to her arthritis but her symptoms only occur with exertion and always resolve with rest. 2. HTN - her bp is much improved on anti-anginal therapy. 3. PPM - her [facility] sci DDD PM is working normally.  Mikle Bosworth.D.

## 2019-09-21 LAB — CBC WITH DIFFERENTIAL/PLATELET
Basophils Absolute: 0 10*3/uL (ref 0.0–0.2)
Basos: 1 %
EOS (ABSOLUTE): 0.1 10*3/uL (ref 0.0–0.4)
Eos: 2 %
Hematocrit: 32.8 % — ABNORMAL LOW (ref 34.0–46.6)
Hemoglobin: 11.3 g/dL (ref 11.1–15.9)
Immature Grans (Abs): 0.1 10*3/uL (ref 0.0–0.1)
Immature Granulocytes: 1 %
Lymphocytes Absolute: 1.3 10*3/uL (ref 0.7–3.1)
Lymphs: 19 %
MCH: 33.1 pg — ABNORMAL HIGH (ref 26.6–33.0)
MCHC: 34.5 g/dL (ref 31.5–35.7)
MCV: 96 fL (ref 79–97)
Monocytes Absolute: 0.7 10*3/uL (ref 0.1–0.9)
Monocytes: 10 %
Neutrophils Absolute: 4.7 10*3/uL (ref 1.4–7.0)
Neutrophils: 67 %
Platelets: 310 10*3/uL (ref 150–450)
RBC: 3.41 x10E6/uL — ABNORMAL LOW (ref 3.77–5.28)
RDW: 13.7 % (ref 11.7–15.4)
WBC: 7 10*3/uL (ref 3.4–10.8)

## 2019-09-21 LAB — BASIC METABOLIC PANEL
BUN/Creatinine Ratio: 11 — ABNORMAL LOW (ref 12–28)
BUN: 14 mg/dL (ref 8–27)
CO2: 23 mmol/L (ref 20–29)
Calcium: 8.5 mg/dL — ABNORMAL LOW (ref 8.7–10.3)
Chloride: 103 mmol/L (ref 96–106)
Creatinine, Ser: 1.29 mg/dL — ABNORMAL HIGH (ref 0.57–1.00)
GFR calc Af Amer: 45 mL/min/{1.73_m2} — ABNORMAL LOW (ref >59)
GFR calc non Af Amer: 39 mL/min/{1.73_m2} — ABNORMAL LOW (ref >59)
Glucose: 102 mg/dL — ABNORMAL HIGH (ref 65–99)
Potassium: 5.1 mmol/L (ref 3.5–5.2)
Sodium: 139 mmol/L (ref 134–144)

## 2019-09-27 ENCOUNTER — Other Ambulatory Visit: Payer: Self-pay

## 2019-09-27 ENCOUNTER — Telehealth: Payer: Self-pay | Admitting: Internal Medicine

## 2019-09-27 ENCOUNTER — Encounter: Payer: Self-pay | Admitting: Internal Medicine

## 2019-09-27 ENCOUNTER — Ambulatory Visit (INDEPENDENT_AMBULATORY_CARE_PROVIDER_SITE_OTHER): Payer: Medicare Other | Admitting: Internal Medicine

## 2019-09-27 DIAGNOSIS — R55 Syncope and collapse: Secondary | ICD-10-CM | POA: Diagnosis not present

## 2019-09-27 NOTE — Assessment & Plan Note (Signed)
Acute This morning she experienced an episode of near syncope-lightheadedness and generalized weakness while getting her medication out of the bathroom No changes in medication, she has been drinking normally no symptoms concerning for infection She is scheduled for cardiac catheterization for concerning symptoms consistent with possible angina PPM this morning did not show concerning rhythm just after this episode She is orthostatic here today, which is likely the cause Currently taking Imdur and metoprolol.  I did advise her to call cardiology to get their advice regarding the medication because of the possible angina, but may need to hold Imdur for now Continue increased fluids which she is good with

## 2019-09-27 NOTE — Telephone Encounter (Signed)
New message   STAT if patient feels like he/she is going to faint   1) Are you dizzy now? Yes   2) Do you feel faint or have you passed out? Yes   3) Do you have any other symptoms? Nausea  Have you checked your HR and BP (record if available)? 150/51 63 hr

## 2019-09-27 NOTE — Telephone Encounter (Signed)
Pt reports weakness/dizziness this morning.  This is new, she has never experienced this before. She was cleaning up kitchen after breakfast, went to get her medication from the bathroom and almost passed.  States if the wall wasn't there she would have gone down. She is concerned this could be "a blockage" b/c "I am getting a heart cath soon".  Pt educated that symptoms reported w/o other correlating symptoms does not sound like a "blockage related concern".   Denies SOB, chest pain, neck/back/radiating pain, edema, syncope or prolonged sitting. Reported morning VS -- 119/48, HR 63 ;  150/51, HR 53 PPM transmission sent in by pt per my request.  Transmission reviewed, no issues/concerns seen.  Pt educated that her PPM is set at low rate of 60 so her recorded HR of 53 this morning is incorrect. Advised no issues seen with heart rates/abn rhythms. Pt advised to call PCP for evaluation this morning or go to an urgent care near her if PCP can't see her.  Patient verbalized understanding and agreeable to plan.

## 2019-09-27 NOTE — Patient Instructions (Signed)
Continue drinking a lot of fluids.  Monitor your BP at home.   Continue your current medications.    If these symptoms recur you need to go to the ED for further evaluation.

## 2019-09-27 NOTE — Telephone Encounter (Signed)
Patient called in after seeing Dr.Burns today. Dr. Quay Burow note in [place]. Pt was orthostatic there today.  She is going to try to stay hydrated. Pt reports no angina symptoms. Adv her to hold isosorbide 30 mg tomorrow am unless BP is greater than 120/60.  She said it never gets that high.  She is aware I am sending to Dr.Taylor and his nurse to review and she will get a call back with further recommendations.

## 2019-09-27 NOTE — Progress Notes (Signed)
Subjective:    Patient ID: [name], female    DOB: 1935/05/07, 85 y.o.   MRN: [id number]  HPI The patient is here for an acute visit.  She is here with her husband because she is too weak to come back by herself.  She states generalized weakness and is unsteady.  This morning when she woke up and she felt good.  She was cleaning up the kitchen.  She went to get her pills I the bathroom and something hit her like a rock.  She grabbed the wall and was able to sit down on the toilet and laid her head down waiting for it to pass.  It got better and she whas able to get to the bed with help from her husband and using her walker.     She called cardiology.  Cardio checked her PPM via the hpone and everything looked ok.  They did not think it was her heart.     Since this morning she has felt very weak and unbalanced and has difficulty walking.  She does not feels lightheaded. She has a band of tightness around the head.     Medications and allergies reviewed with patient and updated if appropriate.  Patient Active Problem List   Diagnosis Date Noted  . Near syncope 09/27/2019  . Hypertension 09/20/2019  . Acute pain of right knee 08/03/2019  . (HFpEF) heart failure with preserved ejection fraction ([place]) 08/02/2019  . Pacemaker 07/19/2019  . CKD (chronic kidney disease) 01/30/2019  . Lumbar spinal stenosis 01/10/2018  . Osteoarthritis of both knees 12/15/2017  . Epigastric pain 09/09/2017  . Neuralgia 09/09/2017  . Claudication of calf muscles ([place]) 11/22/2016  . DOE (dyspnea on exertion) 11/22/2016  . Hyperreflexia of lower extremity 07/16/2016  . Bilateral leg paresthesia 07/16/2016  . Carotid arterial disease ([place]) 07/13/2016  . Poor balance 06/23/2016  . Weakness of both lower extremities 06/23/2016  . Sinus node dysfunction ([place]) 05/05/2016  . Right shoulder pain 03/14/2016  . Multinodular goiter (nontoxic) 02/21/2016  . Neck pain 02/15/2016  . Pain in limb  02/15/2016  . Prediabetes 12/17/2015  . Dysphagia 12/17/2015  . Anxiety 11/13/2015  . T8 vertebral fracture ([place]) 04/09/2015  . Overweight 10/09/2011  . Allergic rhinitis, cause unspecified   . GERD (gastroesophageal reflux disease) 02/05/2011  . Vitamin B 12 deficiency 04/17/2009  . Hyperlipidemia 04/17/2009  . Osteoporosis 04/17/2009  . ADENOCARCINOMA, BREAST, HX OF 04/17/2009  . CONSTIPATION 03/08/2009  . COLONIC POLYPS, HYPERPLASTIC, HX OF 03/06/2009    Current Outpatient Medications on File Prior to Visit  Medication Sig Dispense Refill  . acetaminophen (TYLENOL) 500 MG tablet Take 500 mg by mouth 2 (two) times daily.    . cholecalciferol (VITAMIN D) 1000 UNITS tablet Take 2,000 Units by mouth daily.     . Cyanocobalamin (RA VITAMIN B-12 TR) 1000 MCG TBCR Take 1 tablet by mouth every other day.     . ezetimibe (ZETIA) 10 MG tablet Take 1 tablet (10 mg total) by mouth daily. 90 tablet 3  . fluticasone (FLONASE) 50 MCG/ACT nasal spray Place 1 spray into both nostrils daily as needed for allergies.     . furosemide (LASIX) 40 MG tablet TAKE 1 TABLET ONCE DAILY. 90 tablet 3  . gabapentin (NEURONTIN) 100 MG capsule Take 2 capsules (200 mg total) by mouth at bedtime. 180 capsule 3  . isosorbide mononitrate (IMDUR) 30 MG 24 hr tablet Take 1 tablet (30 mg total) by  mouth daily. 90 tablet 3  . loratadine (CLARITIN) 10 MG tablet Take 10 mg by mouth daily as needed for allergies.    . metoprolol succinate (TOPROL XL) 25 MG 24 hr tablet Take 1 tablet (25 mg total) by mouth daily. 90 tablet 3  . omeprazole (PRILOSEC) 40 MG capsule TAKE (1) CAPSULE TWICE DAILY. 60 capsule 11  . polyethylene glycol (MIRALAX / GLYCOLAX) packet Take 17 g by mouth daily as needed (constipation).     . potassium chloride SA (KLOR-CON) 20 MEQ tablet TAKE 1 TABLET ONCE DAILY. 90 tablet 3   No current facility-administered medications on file prior to visit.    Past Medical History:  Diagnosis Date  . Cancer of  left breast ([place]) 1978  . Chronic thoracic back pain    "T8; fracture; 03/2015; no OR" (05/05/2016)  . GERD (gastroesophageal reflux disease)   . Heart murmur   . History of hiatal hernia   . Hyperlipidemia   . Hyperplastic colon polyp   . IBS (irritable bowel syndrome)   . Multiple thyroid nodules   . Osteoporosis    T8 compression fx 03/2015   . Personal history of radiation therapy   . Presence of permanent cardiac pacemaker   . Vitamin B12 deficiency     Past Surgical History:  Procedure Laterality Date  . ANTERIOR CERVICAL DECOMP/DISCECTOMY FUSION  2008   C5-6  . AUGMENTATION MAMMAPLASTY    . BACK SURGERY    . BREAST SURGERY    . CARDIAC CATHETERIZATION  2001  . EP IMPLANTABLE DEVICE N/A 05/05/2016   Procedure: Pacemaker Implant;  Surgeon: Evans Lance, MD;  Location: [facility];  Service: Cardiovascular;  Laterality: N/A;  . EXCISIONAL HEMORRHOIDECTOMY  1984   With subsequent correction of surgery  . INSERT / REPLACE / REMOVE PACEMAKER    . KNEE ARTHROSCOPY Left    "meniscus tear"  . MASTECTOMY Left 1978  . PLACEMENT OF BREAST IMPLANTS Left 1981  . SHOULDER ARTHROSCOPY W/ ROTATOR CUFF REPAIR Left 02/2006   "tear"  . TUBAL LIGATION      Social History   Socioeconomic History  . Marital status: Married    Spouse name: Not on file  . Number of children: 2  . Years of education: Not on file  . Highest education level: Not on file  Occupational History  . Occupation: retired  Tobacco Use  . Smoking status: Never Smoker  . Smokeless tobacco: Never Used  Substance and Sexual Activity  . Alcohol use: No    Alcohol/week: 0.0 standard drinks  . Drug use: No  . Sexual activity: Not Currently  Other Topics Concern  . Not on file  Social History Narrative   Housewife, Lives with spouse, 2 children   Social Determinants of Health   Financial Resource Strain:   . Difficulty of Paying Living Expenses: Not on file  Food Insecurity:   . Worried About  Charity fundraiser in the Last Year: Not on file  . Ran Out of Food in the Last Year: Not on file  Transportation Needs:   . Lack of Transportation (Medical): Not on file  . Lack of Transportation (Non-Medical): Not on file  Physical Activity:   . Days of Exercise per Week: Not on file  . Minutes of Exercise per Session: Not on file  Stress:   . Feeling of Stress : Not on file  Social Connections:   . Frequency of Communication with Friends and Family: Not  on file  . Frequency of Social Gatherings with Friends and Family: Not on file  . Attends Religious Services: Not on file  . Active Member of Clubs or Organizations: Not on file  . Attends Archivist Meetings: Not on file  . Marital Status: Not on file    Family History  Problem Relation Age of Onset  . Diabetes Sister   . Diabetes Brother   . Breast cancer Maternal Aunt   . Colon cancer Neg Hx     Review of Systems  Constitutional: Positive for fatigue (Generalized weakness). Negative for fever.  Eyes: Negative for visual disturbance.  Respiratory: Negative for cough, shortness of breath and wheezing.   Cardiovascular: Negative for chest pain, palpitations and leg swelling.  Gastrointestinal: Negative for abdominal pain and nausea.  Genitourinary: Negative for dysuria, frequency and hematuria.  Musculoskeletal: Positive for neck pain.  Neurological: Positive for light-headedness and headaches (pain in head on right side, tightness around head). Negative for dizziness, weakness (non focal) and numbness (nothing new).       Objective:   Vitals:   09/27/19 1614 09/27/19 1615  BP: (!) 120/44 (!) 98/42  Pulse:    Resp:    Temp:    SpO2:     BP Readings from Last 3 Encounters:  09/27/19 (!) 98/42  09/20/19 126/64  08/31/19 (!) 158/70   Wt Readings from Last 3 Encounters:  09/27/19 164 lb (74.4 kg)  09/20/19 159 lb (72.1 kg)  08/31/19 164 lb (74.4 kg)   Body mass index is 30 kg/m.   Physical Exam     Constitutional: Appears fatigued. No distress.  Head: Normocephalic and atraumatic.  Neck: Neck supple. No tracheal deviation present. No thyromegaly present.  No cervical lymphadenopathy Cardiovascular: Normal rate, regular rhythm and normal heart sounds.  2/6 systolic murmur heard. No carotid bruit .  No edema Pulmonary/Chest: Effort normal and breath sounds normal. No respiratory distress. No has no wheezes. No rales.  Skin: Skin is warm and dry. Not diaphoretic.  Psychiatric: Normal mood and affect. Behavior is normal.       Assessment & Plan:    See Problem List for Assessment and Plan of chronic medical problems.    This visit occurred during the SARS-CoV-2 public health emergency.  Safety protocols were in place, including screening questions prior to the visit, additional usage of staff PPE, and extensive cleaning of exam room while observing appropriate contact time as indicated for disinfecting solutions.

## 2019-09-28 ENCOUNTER — Telehealth: Payer: Self-pay

## 2019-09-28 NOTE — Telephone Encounter (Addendum)
Call placed to Pt to check in after near syncopal episode.  Left detailed message.  Advised Pt to hold her imdur until after her catheterization.    Dr. Lovena Le in agreement.

## 2019-10-03 ENCOUNTER — Ambulatory Visit: Payer: Medicare Other | Attending: Internal Medicine

## 2019-10-03 DIAGNOSIS — Z23 Encounter for immunization: Secondary | ICD-10-CM

## 2019-10-03 NOTE — Progress Notes (Signed)
   Covid-19 Vaccination Clinic  Name:  [name]    MRN: [id number] DOB: 1935/01/25  10/03/2019  [name] was observed post Covid-19 immunization for 15 minutes without incident. She was provided with Vaccine Information Sheet and instruction to access the V-Safe system.   [name] was instructed to call 911 with any severe reactions post vaccine: [name] Difficulty breathing  . Swelling of face and throat  . A fast heartbeat  . A bad rash all over body  . Dizziness and weakness   Immunizations Administered    Name Date Dose VIS Date Route   Pfizer COVID-19 Vaccine 10/03/2019  1:44 PM 0.3 mL 07/08/2019 Intramuscular   Manufacturer: [place]   Lot: [id number]   [place]: [id number]

## 2019-10-04 ENCOUNTER — Other Ambulatory Visit (HOSPITAL_COMMUNITY)
Admission: RE | Admit: 2019-10-04 | Discharge: 2019-10-04 | Disposition: A | Discharge status: Home / Self Care | Payer: Medicare Other | Source: Ambulatory Visit | Attending: Interventional Cardiology | Admitting: Interventional Cardiology

## 2019-10-04 DIAGNOSIS — Z01812 Encounter for preprocedural laboratory examination: Secondary | ICD-10-CM | POA: Insufficient documentation

## 2019-10-04 DIAGNOSIS — Z20822 Contact with and (suspected) exposure to covid-19: Secondary | ICD-10-CM | POA: Diagnosis not present

## 2019-10-04 LAB — SARS CORONAVIRUS 2 (TAT 6-24 HRS): SARS Coronavirus 2: NEGATIVE

## 2019-10-05 ENCOUNTER — Other Ambulatory Visit: Payer: Self-pay

## 2019-10-05 ENCOUNTER — Other Ambulatory Visit: Payer: Medicare Other | Admitting: *Deleted

## 2019-10-05 ENCOUNTER — Telehealth: Payer: Self-pay | Admitting: *Deleted

## 2019-10-05 DIAGNOSIS — R079 Chest pain, unspecified: Secondary | ICD-10-CM

## 2019-10-05 DIAGNOSIS — Z01812 Encounter for preprocedural laboratory examination: Secondary | ICD-10-CM | POA: Diagnosis not present

## 2019-10-05 LAB — BASIC METABOLIC PANEL
BUN/Creatinine Ratio: 14 (ref 12–28)
BUN: 18 mg/dL (ref 8–27)
CO2: 24 mmol/L (ref 20–29)
Calcium: 8.2 mg/dL — ABNORMAL LOW (ref 8.7–10.3)
Chloride: 101 mmol/L (ref 96–106)
Creatinine, Ser: 1.26 mg/dL — ABNORMAL HIGH (ref 0.57–1.00)
GFR calc Af Amer: 46 mL/min/{1.73_m2} — ABNORMAL LOW (ref >59)
GFR calc non Af Amer: 40 mL/min/{1.73_m2} — ABNORMAL LOW (ref >59)
Glucose: 89 mg/dL (ref 65–99)
Potassium: 4.8 mmol/L (ref 3.5–5.2)
Sodium: 136 mmol/L (ref 134–144)

## 2019-10-05 NOTE — Telephone Encounter (Addendum)
Pt to come to office for STAT BMP today to update prior to [facility] 10/07/19. Pt requests her husband accompany her to assist her with walking, note made on lab appt.

## 2019-10-06 NOTE — Telephone Encounter (Signed)
Pt contacted pre-catheterization scheduled at [facility] for: Friday October 07, 2019 10:30 AM Verified arrival time and place: [facility]) at: 5:30 AM-pre procedure hydration   No solid food after midnight prior to cath, clear liquids until 5 AM day of procedure. Contrast allergy: no  Hold: Lasix/KCl-day before and day -GFR 40-pt had already taken today  AM meds can be  taken pre-cath with sip of water including: ASA 81 mg   Confirmed patient has responsible adult to drive home post procedure and observe 24 hours after arriving home: yes  Currently, due to Covid-19 pandemic, only one person will be allowed with patient. Must be the same person for patient's entire stay and will be required to wear a mask. They will be asked to wait in the waiting room for the duration of the patient's stay.  Patients are required to wear a mask when they enter the hospital.      COVID-19 Pre-Screening Questions:  . In the past 7 to 10 days have you had a cough,  shortness of breath, headache, congestion, fever (100 or greater) body aches, chills, sore throat, or sudden loss of taste or sense of smell? no . Have you been around anyone with known Covid 19 in the past 7-10 days? no . Have you been around anyone who is awaiting Covid 19 test results in the past 7 to 10 days? no . Have you been around anyone who has been exposed to Covid 19, or has mentioned symptoms of Covid 19 within the past 7 to 10 days? No  I reviewed procedure/mask/visitor instructions, COVID-19 screening questions with patient, she verbalized understanding, thanked me for call.

## 2019-10-07 ENCOUNTER — Encounter (HOSPITAL_COMMUNITY): Payer: Self-pay | Admitting: Interventional Cardiology

## 2019-10-07 ENCOUNTER — Encounter (HOSPITAL_COMMUNITY)
Admission: RE | Disposition: A | Discharge status: Home / Self Care | Payer: Self-pay | Source: Home / Self Care | Attending: Interventional Cardiology

## 2019-10-07 ENCOUNTER — Other Ambulatory Visit: Payer: Self-pay

## 2019-10-07 ENCOUNTER — Ambulatory Visit (HOSPITAL_COMMUNITY)
Admission: RE | Admit: 2019-10-07 | Discharge: 2019-10-07 | Disposition: A | Discharge status: Home / Self Care | Payer: Medicare Other | Attending: Interventional Cardiology | Admitting: Interventional Cardiology

## 2019-10-07 DIAGNOSIS — E538 Deficiency of other specified B group vitamins: Secondary | ICD-10-CM | POA: Insufficient documentation

## 2019-10-07 DIAGNOSIS — Z853 Personal history of malignant neoplasm of breast: Secondary | ICD-10-CM | POA: Diagnosis not present

## 2019-10-07 DIAGNOSIS — K589 Irritable bowel syndrome without diarrhea: Secondary | ICD-10-CM | POA: Diagnosis not present

## 2019-10-07 DIAGNOSIS — I25110 Atherosclerotic heart disease of native coronary artery with unstable angina pectoris: Secondary | ICD-10-CM | POA: Diagnosis not present

## 2019-10-07 DIAGNOSIS — I10 Essential (primary) hypertension: Secondary | ICD-10-CM | POA: Diagnosis not present

## 2019-10-07 DIAGNOSIS — Z950 Presence of cardiac pacemaker: Secondary | ICD-10-CM | POA: Insufficient documentation

## 2019-10-07 DIAGNOSIS — K219 Gastro-esophageal reflux disease without esophagitis: Secondary | ICD-10-CM | POA: Diagnosis not present

## 2019-10-07 DIAGNOSIS — Z923 Personal history of irradiation: Secondary | ICD-10-CM | POA: Diagnosis not present

## 2019-10-07 DIAGNOSIS — I25119 Atherosclerotic heart disease of native coronary artery with unspecified angina pectoris: Secondary | ICD-10-CM

## 2019-10-07 DIAGNOSIS — E785 Hyperlipidemia, unspecified: Secondary | ICD-10-CM | POA: Insufficient documentation

## 2019-10-07 DIAGNOSIS — I209 Angina pectoris, unspecified: Secondary | ICD-10-CM | POA: Diagnosis present

## 2019-10-07 DIAGNOSIS — Z79899 Other long term (current) drug therapy: Secondary | ICD-10-CM | POA: Insufficient documentation

## 2019-10-07 HISTORY — PX: LEFT HEART CATH AND CORONARY ANGIOGRAPHY: CATH118249

## 2019-10-07 HISTORY — PX: INTRAVASCULAR PRESSURE WIRE/FFR STUDY: CATH118243

## 2019-10-07 SURGERY — LEFT HEART CATH AND CORONARY ANGIOGRAPHY
Anesthesia: LOCAL

## 2019-10-07 MED ORDER — SODIUM CHLORIDE 0.9 % IV SOLN: 250.0000 mL | INTRAVENOUS | Status: DC | PRN

## 2019-10-07 MED ORDER — SODIUM CHLORIDE 0.9% FLUSH: 3.0000 mL | Freq: Two times a day (BID) | INTRAVENOUS | Status: DC

## 2019-10-07 MED ORDER — HEPARIN (PORCINE) IN NACL 1000-0.9 UT/500ML-% IV SOLN
INTRAVENOUS | Status: AC
  Filled 2019-10-07: qty 1000

## 2019-10-07 MED ORDER — FENTANYL CITRATE (PF) 100 MCG/2ML IJ SOLN
INTRAMUSCULAR | Status: DC | PRN
  Administered 2019-10-07 (×2): 25 ug via INTRAVENOUS

## 2019-10-07 MED ORDER — ONDANSETRON HCL 4 MG/2ML IJ SOLN: 4.0000 mg | Freq: Four times a day (QID) | INTRAMUSCULAR | Status: DC | PRN

## 2019-10-07 MED ORDER — LIDOCAINE HCL (PF) 1 % IJ SOLN
INTRAMUSCULAR | Status: DC | PRN
  Administered 2019-10-07: 2 mL

## 2019-10-07 MED ORDER — FENTANYL CITRATE (PF) 100 MCG/2ML IJ SOLN
INTRAMUSCULAR | Status: AC
  Filled 2019-10-07: qty 2

## 2019-10-07 MED ORDER — HEPARIN SODIUM (PORCINE) 1000 UNIT/ML IJ SOLN
INTRAMUSCULAR | Status: AC
  Filled 2019-10-07: qty 1

## 2019-10-07 MED ORDER — MIDAZOLAM HCL 2 MG/2ML IJ SOLN
INTRAMUSCULAR | Status: DC | PRN
  Administered 2019-10-07 (×2): 1 mg via INTRAVENOUS

## 2019-10-07 MED ORDER — ASPIRIN 81 MG PO CHEW: 81.0000 mg | CHEWABLE_TABLET | Freq: Every day | ORAL | Status: DC

## 2019-10-07 MED ORDER — VERAPAMIL HCL 2.5 MG/ML IV SOLN
INTRAVENOUS | Status: DC | PRN
  Administered 2019-10-07: 2 mL via INTRA_ARTERIAL
  Administered 2019-10-07: 10 mL via INTRA_ARTERIAL

## 2019-10-07 MED ORDER — ASPIRIN 81 MG PO CHEW: 81.0000 mg | CHEWABLE_TABLET | ORAL | Status: DC

## 2019-10-07 MED ORDER — HEPARIN (PORCINE) IN NACL 1000-0.9 UT/500ML-% IV SOLN
INTRAVENOUS | Status: DC | PRN
  Administered 2019-10-07 (×2): 500 mL

## 2019-10-07 MED ORDER — HEPARIN SODIUM (PORCINE) 1000 UNIT/ML IJ SOLN
INTRAMUSCULAR | Status: DC | PRN
  Administered 2019-10-07: 4000 [IU] via INTRAVENOUS
  Administered 2019-10-07: 3500 [IU] via INTRAVENOUS

## 2019-10-07 MED ORDER — SODIUM CHLORIDE 0.9 % WEIGHT BASED INFUSION
3.0000 mL/kg/h | INTRAVENOUS | Status: AC
  Administered 2019-10-07: 3 mL/kg/h via INTRAVENOUS

## 2019-10-07 MED ORDER — LIDOCAINE HCL (PF) 1 % IJ SOLN
INTRAMUSCULAR | Status: AC
  Filled 2019-10-07: qty 30

## 2019-10-07 MED ORDER — MIDAZOLAM HCL 2 MG/2ML IJ SOLN
INTRAMUSCULAR | Status: AC
  Filled 2019-10-07: qty 2

## 2019-10-07 MED ORDER — IOHEXOL 350 MG/ML SOLN
INTRAVENOUS | Status: DC | PRN
  Administered 2019-10-07: 60 mL via INTRA_ARTERIAL

## 2019-10-07 MED ORDER — SODIUM CHLORIDE 0.9% FLUSH: 3.0000 mL | INTRAVENOUS | Status: DC | PRN

## 2019-10-07 MED ORDER — HYDRALAZINE HCL 20 MG/ML IJ SOLN: 10.0000 mg | INTRAMUSCULAR | Status: DC | PRN

## 2019-10-07 MED ORDER — VERAPAMIL HCL 2.5 MG/ML IV SOLN
INTRAVENOUS | Status: AC
  Filled 2019-10-07: qty 2

## 2019-10-07 MED ORDER — SODIUM CHLORIDE 0.9 % WEIGHT BASED INFUSION
1.0000 mL/kg/h | INTRAVENOUS | Status: DC
  Administered 2019-10-07: 1 mL/kg/h via INTRAVENOUS

## 2019-10-07 MED ORDER — LABETALOL HCL 5 MG/ML IV SOLN: 10.0000 mg | INTRAVENOUS | Status: DC | PRN

## 2019-10-07 MED ORDER — SODIUM CHLORIDE 0.9 % IV SOLN: INTRAVENOUS | Status: AC

## 2019-10-07 MED ORDER — ACETAMINOPHEN 325 MG PO TABS: 650.0000 mg | ORAL_TABLET | ORAL | Status: DC | PRN

## 2019-10-07 SURGICAL SUPPLY — 14 items
CATH 5FR JL3.5 JR4 ANG PIG MP (CATHETERS) ×2 IMPLANT
CATH LAUNCHER 6FR JR4 (CATHETERS) ×2 IMPLANT
DEVICE RAD COMP TR BAND LRG (VASCULAR PRODUCTS) ×2 IMPLANT
GLIDESHEATH SLEND SS 6F .021 (SHEATH) ×2 IMPLANT
GUIDEWIRE INQWIRE 1.5J.035X260 (WIRE) ×1 IMPLANT
GUIDEWIRE PRESSURE COMET II (WIRE) ×2 IMPLANT
INQWIRE 1.5J .035X260CM (WIRE) ×2
KIT HEART LEFT (KITS) ×2 IMPLANT
KIT HEMO VALVE WATCHDOG (MISCELLANEOUS) ×2 IMPLANT
PACK CARDIAC CATHETERIZATION (CUSTOM PROCEDURE TRAY) ×2 IMPLANT
SHEATH PROBE COVER 6X72 (BAG) ×2 IMPLANT
TRANSDUCER W/STOPCOCK (MISCELLANEOUS) ×2 IMPLANT
TUBING CIL FLEX 10 FLL-RA (TUBING) ×2 IMPLANT
WIRE HI TORQ VERSACORE-J 145CM (WIRE) ×2 IMPLANT

## 2019-10-07 NOTE — Discharge Instructions (Signed)
Radial Site Care  This sheet gives you information about how to care for yourself after your procedure. Your health care provider may also give you more specific instructions. If you have problems or questions, contact your health care provider. What can I expect after the procedure? After the procedure, it is common to have:  Bruising and tenderness at the catheter insertion area. Follow these instructions at home: Medicines  Take over-the-counter and prescription medicines only as told by your health care provider. Insertion site care  Follow instructions from your health care provider about how to take care of your insertion site. Make sure you: ? Wash your hands with soap and water before you change your bandage (dressing). If soap and water are not available, use hand sanitizer. ? Change your dressing as told by your health care provider. ? Leave stitches (sutures), skin glue, or adhesive strips in place. These skin closures may need to stay in place for 2 weeks or longer. If adhesive strip edges start to loosen and curl up, you may trim the loose edges. Do not remove adhesive strips completely unless your health care provider tells you to do that.  Check your insertion site every day for signs of infection. Check for: ? Redness, swelling, or pain. ? Fluid or blood. ? Pus or a bad smell. ? Warmth.  Do not take baths, swim, or use a hot tub until your health care provider approves.  You may shower 24-48 hours after the procedure, or as directed by your health care provider. ? Remove the dressing and gently wash the site with plain soap and water. ? Pat the area dry with a clean towel. ? Do not rub the site. That could cause bleeding.  Do not apply powder or lotion to the site. Activity   For 24 hours after the procedure, or as directed by your health care provider: ? Do not flex or bend the affected arm. ? Do not push or pull heavy objects with the affected arm. ? Do not  drive yourself home from the hospital or clinic. You may drive 24 hours after the procedure unless your health care provider tells you not to. ? Do not operate machinery or power tools.  Do not lift anything that is heavier than 10 lb (4.5 kg), or the limit that you are told, until your health care provider says that it is safe.  Ask your health care provider when it is okay to: ? Return to work or school. ? Resume usual physical activities or sports. ? Resume sexual activity. General instructions  If the catheter site starts to bleed, raise your arm and put firm pressure on the site. If the bleeding does not stop, get help right away. This is a medical emergency.  If you went home on the same day as your procedure, a responsible adult should be with you for the first 24 hours after you arrive home.  Keep all follow-up visits as told by your health care provider. This is important. Contact a health care provider if:  You have a fever.  You have redness, swelling, or yellow drainage around your insertion site. Get help right away if:  You have unusual pain at the radial site.  The catheter insertion area swells very fast.  The insertion area is bleeding, and the bleeding does not stop when you hold steady pressure on the area.  Your arm or hand becomes pale, cool, tingly, or numb. These symptoms may represent a serious problem   that is an emergency. Do not wait to see if the symptoms will go away. Get medical help right away. Call your local emergency services (911 in the [place] Do not drive yourself to the hospital. Summary  After the procedure, it is common to have bruising and tenderness at the site.  Follow instructions from your health care provider about how to take care of your radial site wound. Check the wound every day for signs of infection.  Do not lift anything that is heavier than 10 lb (4.5 kg), or the limit that you are told, until your health care provider says  that it is safe. This information is not intended to replace advice given to you by your health care provider. Make sure you discuss any questions you have with your health care provider. Document Revised: 08/19/2017 Document Reviewed: 08/19/2017 Elsevier Patient Education  2020 Elsevier Inc.  

## 2019-10-07 NOTE — Interval H&P Note (Signed)
Cath Lab Visit (complete for each Cath Lab visit)  Clinical Evaluation Leading to the Procedure:   ACS: No.  Non-ACS:    Anginal Classification: CCS III  Anti-ischemic medical therapy: Minimal Therapy (1 class of medications)  Non-Invasive Test Results: No non-invasive testing performed  Prior CABG: No previous CABG      History and Physical Interval Note:  10/07/2019 10:24 AM  [name]  has presented today for surgery, with the diagnosis of chest pain.  The various methods of treatment have been discussed with the patient and family. After consideration of risks, benefits and other options for treatment, the patient has consented to  Procedure(s): LEFT HEART CATH AND CORONARY ANGIOGRAPHY (N/A) as a surgical intervention.  The patient's history has been reviewed, patient examined, no change in status, stable for surgery.  I have reviewed the patient's chart and labs.  Questions were answered to the patient's satisfaction.     Larae Grooms

## 2019-10-10 ENCOUNTER — Telehealth: Payer: Self-pay | Admitting: Internal Medicine

## 2019-10-10 NOTE — Telephone Encounter (Signed)
Patient states she needed to f/u in office with Dr. Lovena Le for her post cath surgery (10/07/19). Not sure how far the appointment needs to be pushed out. Patient prefers to see Dr. Lovena Le.

## 2019-10-10 NOTE — Telephone Encounter (Signed)
Follow up  Pt returning call to follow up to speak with Dr. Lovena Le Nurse regarding her f/u appt for her post cath surgery.  Please call

## 2019-10-12 NOTE — Telephone Encounter (Signed)
Call returned to Pt.  Advised to discontinue Imdur  Continue metoprolol  Pt to follow up post cath in 4 weeks.

## 2019-10-20 DIAGNOSIS — H35373 Puckering of macula, bilateral: Secondary | ICD-10-CM | POA: Diagnosis not present

## 2019-10-20 DIAGNOSIS — H0100A Unspecified blepharitis right eye, upper and lower eyelids: Secondary | ICD-10-CM | POA: Diagnosis not present

## 2019-10-20 DIAGNOSIS — G43809 Other migraine, not intractable, without status migrainosus: Secondary | ICD-10-CM | POA: Diagnosis not present

## 2019-10-20 DIAGNOSIS — H04123 Dry eye syndrome of bilateral lacrimal glands: Secondary | ICD-10-CM | POA: Diagnosis not present

## 2019-10-20 DIAGNOSIS — H524 Presbyopia: Secondary | ICD-10-CM | POA: Diagnosis not present

## 2019-10-27 ENCOUNTER — Other Ambulatory Visit: Payer: Self-pay

## 2019-10-27 ENCOUNTER — Ambulatory Visit (INDEPENDENT_AMBULATORY_CARE_PROVIDER_SITE_OTHER): Payer: Medicare Other

## 2019-10-27 ENCOUNTER — Ambulatory Visit (INDEPENDENT_AMBULATORY_CARE_PROVIDER_SITE_OTHER): Payer: Medicare Other | Admitting: Family Medicine

## 2019-10-27 ENCOUNTER — Encounter: Payer: Self-pay | Admitting: Family Medicine

## 2019-10-27 VITALS — BP 138/68 | HR 71 | Ht 61.5 in | Wt 163.2 lb

## 2019-10-27 DIAGNOSIS — M48061 Spinal stenosis, lumbar region without neurogenic claudication: Secondary | ICD-10-CM

## 2019-10-27 DIAGNOSIS — M542 Cervicalgia: Secondary | ICD-10-CM

## 2019-10-27 DIAGNOSIS — M545 Low back pain: Secondary | ICD-10-CM | POA: Diagnosis not present

## 2019-10-27 NOTE — Patient Instructions (Signed)
Stop Gabapentin. Am referring you for an epidural. See me after your epidural.  Call us once you get scheduled for your epidural to schedule an appt.

## 2019-10-27 NOTE — Progress Notes (Signed)
[facility] [address] [place] Phone: [phone] Subjective:    This visit occurred during the SARS-CoV-2 public health emergency.  Safety protocols were in place, including screening questions prior to the visit, additional usage of staff PPE, and extensive cleaning of exam room while observing appropriate contact time as indicated for disinfecting solutions.   I'm seeing this patient by the request  of:  Burns, Claudina Lick, MD  CC: Neck, L shoulder  I, Wendy Poet, LAT, ATC, am serving as scribe for Dr. Hulan Saas.   [id number]   02/23/2019 Degenerative disc disease discussed with patient about posture and ergonomics.  Discussed which activities of doing which was to avoid.  Low dose of gabapentin given at night.  Discontinue the amitriptyline at bedtime.  I believe that this could help with more of the pain.  Differential includes more of the rotator cuff arthropathy versus the possibility of cervical neuropathy.  Patient does not find any significant weakness more than her contralateral side at the moment.  Patient will follow-up with me again in 4 to 8 weeks  10/27/2019 [name] is a 85 y.o. female coming in with complaint of neck and L shoulder pain. She was provided a HEP and prescribed Gabapentin.  She was also recently seen on 08/31/19 by Dr. Georgina Snell for her R knee.  Since her last visit, pt reports pain in the R side of her back that radiates up to her neck.  She is also having pain in her L posterior shoulder/scapula area.  She has been using Voltaren gel and con't to take the Gabapentin.  O    Past Medical History:  Diagnosis Date  . Cancer of left breast ([place]) 1978  . Chronic thoracic back pain    "T8; fracture; 03/2015; no OR" (05/05/2016)  . GERD (gastroesophageal reflux disease)   . Heart murmur   . History of hiatal hernia   . Hyperlipidemia   . Hyperplastic colon polyp   . IBS (irritable bowel syndrome)   .  Multiple thyroid nodules   . Osteoporosis    T8 compression fx 03/2015   . Personal history of radiation therapy   . Presence of permanent cardiac pacemaker   . Vitamin B12 deficiency    Past Surgical History:  Procedure Laterality Date  . ANTERIOR CERVICAL DECOMP/DISCECTOMY FUSION  2008   C5-6  . AUGMENTATION MAMMAPLASTY    . BACK SURGERY    . BREAST SURGERY    . CARDIAC CATHETERIZATION  2001  . EP IMPLANTABLE DEVICE N/A 05/05/2016   Procedure: Pacemaker Implant;  Surgeon: Evans Lance, MD;  Location: [facility];  Service: Cardiovascular;  Laterality: N/A;  . EXCISIONAL HEMORRHOIDECTOMY  1984   With subsequent correction of surgery  . INSERT / REPLACE / REMOVE PACEMAKER    . INTRAVASCULAR PRESSURE WIRE/FFR STUDY N/A 10/07/2019   Procedure: INTRAVASCULAR PRESSURE WIRE/FFR STUDY;  Surgeon: Jettie Booze, MD;  Location: [facility];  Service: Cardiovascular;  Laterality: N/A;  . KNEE ARTHROSCOPY Left    "meniscus tear"  . LEFT HEART CATH AND CORONARY ANGIOGRAPHY N/A 10/07/2019   Procedure: LEFT HEART CATH AND CORONARY ANGIOGRAPHY;  Surgeon: Jettie Booze, MD;  Location: [facility];  Service: Cardiovascular;  Laterality: N/A;  . MASTECTOMY Left 1978  . PLACEMENT OF BREAST IMPLANTS Left 1981  . SHOULDER ARTHROSCOPY W/ ROTATOR CUFF REPAIR Left 02/2006   "tear"  . TUBAL LIGATION  Social History   Socioeconomic History  . Marital status: Married    Spouse name: Not on file  . Number of children: 2  . Years of education: Not on file  . Highest education level: Not on file  Occupational History  . Occupation: retired  Tobacco Use  . Smoking status: Never Smoker  . Smokeless tobacco: Never Used  Substance and Sexual Activity  . Alcohol use: No    Alcohol/week: 0.0 standard drinks  . Drug use: No  . Sexual activity: Not Currently  Other Topics Concern  . Not on file  Social History Narrative   Housewife, Lives with spouse, 2 children    Social Determinants of Health   Financial Resource Strain:   . Difficulty of Paying Living Expenses:   Food Insecurity:   . Worried About Charity fundraiser in the Last Year:   . Arboriculturist in the Last Year:   Transportation Needs:   . Film/video editor (Medical):   [name] Lack of Transportation (Non-Medical):   Physical Activity:   . Days of Exercise per Week:   . Minutes of Exercise per Session:   Stress:   . Feeling of Stress :   Social Connections:   . Frequency of Communication with Friends and Family:   . Frequency of Social Gatherings with Friends and Family:   . Attends Religious Services:   . Active Member of Clubs or Organizations:   . Attends Archivist Meetings:   [name] Marital Status:    Allergies  Allergen Reactions  . Lipitor [Atorvastatin]     Lipitor caused hospitalization - depletion of electrolytes, fever, nausea, loss of appetite, couldn't get out of bed  . Statins     Lipitor caused hospitalization - - depletion of electrolytes, fever, nausea, loss of appetite, couldn't get out of bed  . Cymbalta [Duloxetine Hcl]     Dulled her too much, difficulty urinating, change in vision  . Fosamax [Alendronate Sodium]     Caused chronic issues swallowing  . Lyrica [Pregabalin]     Unknown  . Neurontin [Gabapentin] Other (See Comments)    Dizziness and sedation (patient is tolerating in lower dose)  . Zanaflex [Tizanidine] Other (See Comments)    Could not sleep   Family History  Problem Relation Age of Onset  . Diabetes Sister   . Diabetes Brother   . Breast cancer Maternal Aunt   . Colon cancer Neg Hx      Current Outpatient Medications (Cardiovascular):  .  ezetimibe (ZETIA) 10 MG tablet, Take 1 tablet (10 mg total) by mouth daily. (Patient taking differently: Take 10 mg by mouth daily in the afternoon. ) .  furosemide (LASIX) 40 MG tablet, TAKE 1 TABLET ONCE DAILY. (Patient taking differently: Take 40 mg by mouth daily. ) .  metoprolol  succinate (TOPROL XL) 25 MG 24 hr tablet, Take 1 tablet (25 mg total) by mouth daily. (Patient taking differently: Take 25 mg by mouth daily at 10 pm. ) .  isosorbide mononitrate (IMDUR) 30 MG 24 hr tablet, Take 1 tablet (30 mg total) by mouth daily.  Current Outpatient Medications (Respiratory):  .  loratadine (CLARITIN) 10 MG tablet, Take 10 mg by mouth daily as needed for allergies. .  sodium chloride (OCEAN) 0.65 % SOLN nasal spray, Place 1 spray into both nostrils 4 (four) times daily as needed for congestion.  Current Outpatient Medications (Analgesics):  .  acetaminophen (TYLENOL) 500 MG tablet, Take 500 mg  by mouth 2 (two) times daily.  Current Outpatient Medications (Hematological):  [name]  Cyanocobalamin (RA VITAMIN B-12 TR) 1000 MCG TBCR, Take 1,000 mcg by mouth daily.   Current Outpatient Medications (Other):  .  cholecalciferol (VITAMIN D) 1000 UNITS tablet, Take 2,000 Units by mouth daily.  [name]  gabapentin (NEURONTIN) 100 MG capsule, Take 2 capsules (200 mg total) by mouth at bedtime. [name]  omeprazole (PRILOSEC) 40 MG capsule, TAKE (1) CAPSULE TWICE DAILY. (Patient taking differently: Take 40 mg by mouth in the morning and at bedtime. TAKE (1) CAPSULE TWICE DAILY.) .  Polyethyl Glycol-Propyl Glycol (LUBRICANT EYE DROPS) 0.4-0.3 % SOLN, Place 1 drop into both eyes 3 (three) times daily as needed (dry/irritated eyes.). [name]  polyethylene glycol (MIRALAX / GLYCOLAX) packet, Take 17 g by mouth daily as needed (constipation).  .  potassium chloride SA (KLOR-CON) 20 MEQ tablet, TAKE 1 TABLET ONCE DAILY. (Patient taking differently: Take 20 mEq by mouth daily. )   Reviewed prior external information including notes and imaging from  primary care provider As well as notes that were available from care everywhere and other healthcare systems.  Past medical history, social, surgical and family history all reviewed in electronic medical record.  No pertanent information unless stated regarding to  the chief complaint.   Review of Systems:  No headache, visual changes, nausea, vomiting, diarrhea, constipation, dizziness, abdominal pain, skin rash, fevers, chills, night sweats, weight loss, swollen lymph nodes, body aches, joint swelling, chest pain, shortness of breath, mood changes. POSITIVE muscle aches  Objective  Blood pressure 138/68, pulse 71, height 5' 1.5" (1.562 m), weight 163 lb 3.2 oz (74 kg), SpO2 99 %.   General: No apparent distress alert and oriented x3 mood and affect normal, dressed appropriately.  HEENT: Pupils equal, extraocular movements intact  Respiratory: Patient's speak in full sentences and does not appear short of breath  Cardiovascular: No lower extremity edema, non tender, no erythema  Neuro: Cranial nerves II through XII are intact, neurovascularly intact in all extremities with 2+ DTRs and 2+ pulses.  Low back exam shows the patient does have significant loss of lordosis with degenerative scoliosis.  Patient does have a straight leg test.  Patient does have some mild weakness of 4 out of 5 strength of the lower legs bilaterally    Neck exam does have some mild anterior displacement of the neck noted.  Tender to palpation diffusely.  Negative Spurling's.    Impression and Recommendations:     This case required medical decision making of moderate complexity. The above documentation has been reviewed and is accurate and complete Lyndal Pulley, DO       Note: This dictation was prepared with Dragon dictation along with smaller phrase technology. Any transcriptional errors that result from this process are unintentional.

## 2019-10-27 NOTE — Assessment & Plan Note (Signed)
Patient has had worsening pain at this moment.  I do believe that patient would be a good candidate for epidurals.  In order 1 at this point for the L4-L5.  Has been quite some time.  Patient has unfortunately too many allergies to medications that does change medical management.  Having some difficulty with the gabapentin so discontinue at this time.  Was going to start a duloxetine but secondary to also having grogginess with it.  We will hold on medications at the moment but if any worsening consider prednisone short-term.  Follow-up again in 3 to 4 weeks after the epidural

## 2019-10-27 NOTE — Assessment & Plan Note (Signed)
Chronic as well.  Discussed with patient about the potential for x-rays.  We will consider this at follow-up.  We also discussed the possibility of trigger point injections in the parascapular region but patient declined at the moment.

## 2019-11-01 ENCOUNTER — Ambulatory Visit (INDEPENDENT_AMBULATORY_CARE_PROVIDER_SITE_OTHER): Payer: Medicare Other | Admitting: *Deleted

## 2019-11-01 DIAGNOSIS — I495 Sick sinus syndrome: Secondary | ICD-10-CM

## 2019-11-01 LAB — CUP PACEART REMOTE DEVICE CHECK
Battery Remaining Longevity: 132 mo
Battery Remaining Percentage: 100 %
Brady Statistic RA Percent Paced: 55 %
Brady Statistic RV Percent Paced: 0 %
Date Time Interrogation Session: 20210406030100
Implantable Lead Implant Date: 20171009
Implantable Lead Implant Date: 20171009
Implantable Lead Location: 753859
Implantable Lead Location: 753860
Implantable Lead Model: 5076
Implantable Lead Model: 7741
Implantable Lead Serial Number: 785430
Implantable Pulse Generator Implant Date: 20171009
Lead Channel Impedance Value: 382 Ohm
Lead Channel Impedance Value: 614 Ohm
Lead Channel Pacing Threshold Amplitude: 1.3 V
Lead Channel Pacing Threshold Pulse Width: 0.4 ms
Lead Channel Setting Pacing Amplitude: 2 V
Lead Channel Setting Pacing Amplitude: 2.5 V
Lead Channel Setting Pacing Pulse Width: 0.4 ms
Lead Channel Setting Sensing Sensitivity: 2.5 mV
Pulse Gen Serial Number: 766467

## 2019-11-02 NOTE — Progress Notes (Signed)
PPM Remote  

## 2019-11-04 ENCOUNTER — Ambulatory Visit
Admission: RE | Admit: 2019-11-04 | Discharge: 2019-11-04 | Disposition: A | Discharge status: Home / Self Care | Payer: Medicare Other | Source: Ambulatory Visit | Attending: Family Medicine | Admitting: Family Medicine

## 2019-11-04 DIAGNOSIS — M48061 Spinal stenosis, lumbar region without neurogenic claudication: Secondary | ICD-10-CM | POA: Diagnosis not present

## 2019-11-04 MED ORDER — IOPAMIDOL (ISOVUE-M 200) INJECTION 41%: 1.0000 mL | Freq: Once | INTRAMUSCULAR | Status: DC

## 2019-11-04 MED ORDER — METHYLPREDNISOLONE ACETATE 40 MG/ML INJ SUSP (RADIOLOG: 120.0000 mg | Freq: Once | INTRAMUSCULAR | Status: DC

## 2019-11-04 NOTE — Discharge Instructions (Signed)

## 2019-11-11 ENCOUNTER — Encounter: Payer: Self-pay | Admitting: Internal Medicine

## 2019-11-11 ENCOUNTER — Other Ambulatory Visit: Payer: Self-pay

## 2019-11-11 ENCOUNTER — Telehealth: Payer: Self-pay | Admitting: Internal Medicine

## 2019-11-11 ENCOUNTER — Ambulatory Visit (INDEPENDENT_AMBULATORY_CARE_PROVIDER_SITE_OTHER): Payer: Medicare Other | Admitting: Internal Medicine

## 2019-11-11 VITALS — BP 138/70 | HR 60 | Ht 61.5 in | Wt 162.0 lb

## 2019-11-11 DIAGNOSIS — Z950 Presence of cardiac pacemaker: Secondary | ICD-10-CM

## 2019-11-11 DIAGNOSIS — I495 Sick sinus syndrome: Secondary | ICD-10-CM

## 2019-11-11 NOTE — Telephone Encounter (Signed)
Patient states that she was told to discontinue Imdur, however it was on her medication list on her after visit summary today so she is wondering if she is supposed to be taking it. I advised her that Imdur was discontinued so she should not be taking it and I will remove it from her medication list. Patient also states that she is no longer taking gabapentin. I will also remove that from her list.

## 2019-11-11 NOTE — Patient Instructions (Signed)

## 2019-11-11 NOTE — Telephone Encounter (Addendum)
Pt c/o medication issue:  1. Name of Medication:  isosorbide mononitrate (IMDUR) 30 MG 24 hr tablet gabapentin (NEURONTIN) 100 MG capsule  2. How are you currently taking this medication (dosage and times per day)? Not currently taking  3. Are you having a reaction (difficulty breathing--STAT)? no  4. What is your medication issue? Patient states she was told to stop isosorbide back before her heart cath and thought she was supposed to stay off it. She states it was listed on her current medications and would like to clarify whether she needs to stay off the medication. She also states she has been off gabapentin as well.

## 2019-11-11 NOTE — Progress Notes (Signed)
HPI [name] returns today for followup. She is a pleasant 85 yo woman with sinus node dysfunction s/p PPM insertion. She had exertional neck and back pain associate with sob and underwent left heart cath several weeks ago which demonstrated an occluded apical LAD. It was thought to be too distal in the LAD for coronary intervention. She was treated medically. She has improved. Her dyspnea is much better. No back or chest pain. No edema. She admits to sodium indiscretion.  Allergies  Allergen Reactions  . Lipitor [Atorvastatin]     Lipitor caused hospitalization - depletion of electrolytes, fever, nausea, loss of appetite, couldn't get out of bed  . Statins     Lipitor caused hospitalization - - depletion of electrolytes, fever, nausea, loss of appetite, couldn't get out of bed  . Cymbalta [Duloxetine Hcl]     Dulled her too much, difficulty urinating, change in vision  . Fosamax [Alendronate Sodium]     Caused chronic issues swallowing  . Lyrica [Pregabalin]     Unknown  . Neurontin [Gabapentin] Other (See Comments)    Dizziness and sedation (patient is tolerating in lower dose)  . Zanaflex [Tizanidine] Other (See Comments)    Could not sleep     Current Outpatient Medications  Medication Sig Dispense Refill  . acetaminophen (TYLENOL) 500 MG tablet Take 500 mg by mouth 2 (two) times daily.    . cholecalciferol (VITAMIN D) 1000 UNITS tablet Take 2,000 Units by mouth daily.     . Cyanocobalamin (RA VITAMIN B-12 TR) 1000 MCG TBCR Take 1,000 mcg by mouth daily.     [name] ezetimibe (ZETIA) 10 MG tablet Take 1 tablet (10 mg total) by mouth daily. 90 tablet 3  . furosemide (LASIX) 40 MG tablet TAKE 1 TABLET ONCE DAILY. 90 tablet 3  . gabapentin (NEURONTIN) 100 MG capsule Take 2 capsules (200 mg total) by mouth at bedtime. 180 capsule 3  . loratadine (CLARITIN) 10 MG tablet Take 10 mg by mouth daily as needed for allergies.    . metoprolol succinate (TOPROL XL) 25 MG 24 hr tablet  Take 1 tablet (25 mg total) by mouth daily. 90 tablet 3  . omeprazole (PRILOSEC) 40 MG capsule TAKE (1) CAPSULE TWICE DAILY. 60 capsule 11  . Polyethyl Glycol-Propyl Glycol (LUBRICANT EYE DROPS) 0.4-0.3 % SOLN Place 1 drop into both eyes 3 (three) times daily as needed (dry/irritated eyes.).    [name] polyethylene glycol (MIRALAX / GLYCOLAX) packet Take 17 g by mouth daily as needed (constipation).     . potassium chloride SA (KLOR-CON) 20 MEQ tablet TAKE 1 TABLET ONCE DAILY. 90 tablet 3  . sodium chloride (OCEAN) 0.65 % SOLN nasal spray Place 1 spray into both nostrils 4 (four) times daily as needed for congestion.    . isosorbide mononitrate (IMDUR) 30 MG 24 hr tablet Take 1 tablet (30 mg total) by mouth daily. 90 tablet 3   No current facility-administered medications for this visit.     Past Medical History:  Diagnosis Date  . Cancer of left breast ([place]) 1978  . Chronic thoracic back pain    "T8; fracture; 03/2015; no OR" (05/05/2016)  . GERD (gastroesophageal reflux disease)   . Heart murmur   . History of hiatal hernia   . Hyperlipidemia   . Hyperplastic colon polyp   . IBS (irritable bowel syndrome)   . Multiple thyroid nodules   . Osteoporosis    T8 compression fx 03/2015   .  Personal history of radiation therapy   . Presence of permanent cardiac pacemaker   . Vitamin B12 deficiency     ROS:   All systems reviewed and negative except as noted in the HPI.   Past Surgical History:  Procedure Laterality Date  . ANTERIOR CERVICAL DECOMP/DISCECTOMY FUSION  2008   C5-6  . AUGMENTATION MAMMAPLASTY    . BACK SURGERY    . BREAST SURGERY    . CARDIAC CATHETERIZATION  2001  . EP IMPLANTABLE DEVICE N/A 05/05/2016   Procedure: Pacemaker Implant;  Surgeon: Evans Lance, MD;  Location: [facility];  Service: Cardiovascular;  Laterality: N/A;  . EXCISIONAL HEMORRHOIDECTOMY  1984   With subsequent correction of surgery  . INSERT / REPLACE / REMOVE PACEMAKER    . INTRAVASCULAR  PRESSURE WIRE/FFR STUDY N/A 10/07/2019   Procedure: INTRAVASCULAR PRESSURE WIRE/FFR STUDY;  Surgeon: Jettie Booze, MD;  Location: [facility];  Service: Cardiovascular;  Laterality: N/A;  . KNEE ARTHROSCOPY Left    "meniscus tear"  . LEFT HEART CATH AND CORONARY ANGIOGRAPHY N/A 10/07/2019   Procedure: LEFT HEART CATH AND CORONARY ANGIOGRAPHY;  Surgeon: Jettie Booze, MD;  Location: [facility];  Service: Cardiovascular;  Laterality: N/A;  . MASTECTOMY Left 1978  . PLACEMENT OF BREAST IMPLANTS Left 1981  . SHOULDER ARTHROSCOPY W/ ROTATOR CUFF REPAIR Left 02/2006   "tear"  . TUBAL LIGATION       Family History  Problem Relation Age of Onset  . Diabetes Sister   . Diabetes Brother   . Breast cancer Maternal Aunt   . Colon cancer Neg Hx      Social History   Socioeconomic History  . Marital status: Married    Spouse name: Not on file  . Number of children: 2  . Years of education: Not on file  . Highest education level: Not on file  Occupational History  . Occupation: retired  Tobacco Use  . Smoking status: Never Smoker  . Smokeless tobacco: Never Used  Substance and Sexual Activity  . Alcohol use: No    Alcohol/week: 0.0 standard drinks  . Drug use: No  . Sexual activity: Not Currently  Other Topics Concern  . Not on file  Social History Narrative   Housewife, Lives with spouse, 2 children   Social Determinants of Health   Financial Resource Strain:   . Difficulty of Paying Living Expenses:   Food Insecurity:   . Worried About Charity fundraiser in the Last Year:   . Arboriculturist in the Last Year:   Transportation Needs:   . Film/video editor (Medical):   [name] Lack of Transportation (Non-Medical):   Physical Activity:   . Days of Exercise per Week:   . Minutes of Exercise per Session:   Stress:   . Feeling of Stress :   Social Connections:   . Frequency of Communication with Friends and Family:   . Frequency of Social  Gatherings with Friends and Family:   . Attends Religious Services:   . Active Member of Clubs or Organizations:   . Attends Archivist Meetings:   [name] Marital Status:   Intimate Partner Violence:   . Fear of Current or Ex-Partner:   . Emotionally Abused:   [name] Physically Abused:   . Sexually Abused:      BP 138/70   Pulse 60   Ht 5' 1.5" (1.562 m)   Wt 162 lb (73.5  kg)   SpO2 98%   BMI 30.11 kg/m   Physical Exam:  Well appearing NAD HEENT: Unremarkable Neck:  No JVD, no thyromegally Lymphatics:  No adenopathy Back:  No CVA tenderness Lungs:  Clear with no wheezes HEART:  Regular rate rhythm, no murmurs, no rubs, no clicks Abd:  soft, positive bowel sounds, no organomegally, no rebound, no guarding Ext:  2 plus pulses, no edema, no cyanosis, no clubbing Skin:  No rashes no nodules Neuro:  CN II through XII intact, motor grossly intact  EKG - nsr with atrial pacing  DEVICE  Normal device function.  See PaceArt for details.   Assess/Plan: 1. Sinus node dysfunction - she is much improved, s/p PPM insertion. 2. PPM- her [employer] DDD PM is working normally.  3. CAD - she denies anginal symptoms. She will continue her current meds. 4. HTN -her bp is fairly well controlled. She will continue her current meds and is encouraged to lose weight.  Mikle [email or web address].D.

## 2019-11-15 NOTE — Addendum Note (Signed)
Addended by: Rose Phi on: 11/15/2019 11:34 AM   Modules accepted: Orders

## 2019-11-25 ENCOUNTER — Other Ambulatory Visit: Payer: Self-pay

## 2019-11-25 ENCOUNTER — Encounter: Payer: Self-pay | Admitting: Family Medicine

## 2019-11-25 ENCOUNTER — Ambulatory Visit (INDEPENDENT_AMBULATORY_CARE_PROVIDER_SITE_OTHER): Payer: Medicare Other | Admitting: Family Medicine

## 2019-11-25 VITALS — BP 120/60 | HR 63 | Ht 61.0 in | Wt 162.0 lb

## 2019-11-25 DIAGNOSIS — G8929 Other chronic pain: Secondary | ICD-10-CM | POA: Diagnosis not present

## 2019-11-25 DIAGNOSIS — M549 Dorsalgia, unspecified: Secondary | ICD-10-CM

## 2019-11-25 DIAGNOSIS — M48062 Spinal stenosis, lumbar region with neurogenic claudication: Secondary | ICD-10-CM | POA: Diagnosis not present

## 2019-11-25 NOTE — Assessment & Plan Note (Signed)
Moderate to severe overall.  Did respond initially to the epidural and is starting to have increasing discomfort and pain again.  I believe patient will do very well with conservative therapy.  Patient will have the epidural again.  Due to her other comorbidities she is likely not a candidate for potential surgical intervention.  Could potentially consider the possibility of radiofrequency ablation but also fairly low likelihood.  Patient has many different allergies to medications which makes it difficult to treat in any other way as well.  We will do the epidural and then follow-up either virtually in 3 to 4 weeks.  Total time with patient reviewing patient's chart and images greater than 35 minutes

## 2019-11-25 NOTE — Progress Notes (Signed)
[facility] [address] [place] Phone: [phone] Subjective:   I [name] am serving as a Education administrator for Dr. Hulan Saas.  This visit occurred during the SARS-CoV-2 public health emergency.  Safety protocols were in place, including screening questions prior to the visit, additional usage of staff PPE, and extensive cleaning of exam room while observing appropriate contact time as indicated for disinfecting solutions.   I'm seeing this patient by the request  of:  [name], MD   CC: Lower back pain follow-up  [id number]   10/27/2019 Patient has had worsening pain at this moment.  I do believe that patient would be a good candidate for epidurals.  In order 1 at this point for the L4-L5.  Has been quite some time.  Patient has unfortunately too many allergies to medications that does change medical management.  Having some difficulty with the gabapentin so discontinue at this time.  Was going to start a duloxetine but secondary to also having grogginess with it.  We will hold on medications at the moment but if any worsening consider prednisone short-term.  Follow-up again in 3 to 4 weeks after the epidural  Chronic as well.  Discussed with patient about the potential for x-rays.  We will consider this at follow-up.  We also discussed the possibility of trigger point injections in the parascapular region but patient declined at the moment.  Update 11/25/2019 [name] is a 85 y.o. female coming in with complaint of neck and back pain. Patient states she believes the epidural helped her. This morning she started to hurt on her upper trap on the left side. Standing in the kitchen and other ADLs are painful. Standing up straight she is painful.  Patient states initially felt about 85 to 95% better until this week.  Now starting to have some of the same discomfort again.  Once again started on the left side and she does not know what  that seems like it but now the left side seems to have almost completely resolved.       Past Medical History:  Diagnosis Date  . Cancer of left breast ([place]) 1978  . Chronic thoracic back pain    "T8; fracture; 03/2015; no OR" (05/05/2016)  . GERD (gastroesophageal reflux disease)   . Heart murmur   . History of hiatal hernia   . Hyperlipidemia   . Hyperplastic colon polyp   . IBS (irritable bowel syndrome)   . Multiple thyroid nodules   . Osteoporosis    T8 compression fx 03/2015   . Personal history of radiation therapy   . Presence of permanent cardiac pacemaker   . Vitamin B12 deficiency    Past Surgical History:  Procedure Laterality Date  . ANTERIOR CERVICAL DECOMP/DISCECTOMY FUSION  2008   C5-6  . AUGMENTATION MAMMAPLASTY    . BACK SURGERY    . BREAST SURGERY    . CARDIAC CATHETERIZATION  2001  . EP IMPLANTABLE DEVICE N/A 05/05/2016   Procedure: Pacemaker Implant;  Surgeon: Evans Lance, MD;  Location: [facility];  Service: Cardiovascular;  Laterality: N/A;  . EXCISIONAL HEMORRHOIDECTOMY  1984   With subsequent correction of surgery  . INSERT / REPLACE / REMOVE PACEMAKER    . INTRAVASCULAR PRESSURE WIRE/FFR STUDY N/A 10/07/2019   Procedure: INTRAVASCULAR PRESSURE WIRE/FFR STUDY;  Surgeon: Jettie Booze, MD;  Location: [facility];  Service: Cardiovascular;  Laterality: N/A;  .  KNEE ARTHROSCOPY Left    "meniscus tear"  . LEFT HEART CATH AND CORONARY ANGIOGRAPHY N/A 10/07/2019   Procedure: LEFT HEART CATH AND CORONARY ANGIOGRAPHY;  Surgeon: Jettie Booze, MD;  Location: [facility];  Service: Cardiovascular;  Laterality: N/A;  . MASTECTOMY Left 1978  . PLACEMENT OF BREAST IMPLANTS Left 1981  . SHOULDER ARTHROSCOPY W/ ROTATOR CUFF REPAIR Left 02/2006   "tear"  . TUBAL LIGATION     Social History   Socioeconomic History  . Marital status: Married    Spouse name: Not on file  . Number of children: 2  . Years of education: Not on  file  . Highest education level: Not on file  Occupational History  . Occupation: retired  Tobacco Use  . Smoking status: Never Smoker  . Smokeless tobacco: Never Used  Substance and Sexual Activity  . Alcohol use: No    Alcohol/week: 0.0 standard drinks  . Drug use: No  . Sexual activity: Not Currently  Other Topics Concern  . Not on file  Social History Narrative   Housewife, Lives with spouse, 2 children   Social Determinants of Health   Financial Resource Strain:   . Difficulty of Paying Living Expenses:   Food Insecurity:   . Worried About Charity fundraiser in the Last Year:   . Arboriculturist in the Last Year:   Transportation Needs:   . Film/video editor (Medical):   [name] Lack of Transportation (Non-Medical):   Physical Activity:   . Days of Exercise per Week:   . Minutes of Exercise per Session:   Stress:   . Feeling of Stress :   Social Connections:   . Frequency of Communication with Friends and Family:   . Frequency of Social Gatherings with Friends and Family:   . Attends Religious Services:   . Active Member of Clubs or Organizations:   . Attends Archivist Meetings:   [name] Marital Status:    Allergies  Allergen Reactions  . Lipitor [Atorvastatin]     Lipitor caused hospitalization - depletion of electrolytes, fever, nausea, loss of appetite, couldn't get out of bed  . Statins     Lipitor caused hospitalization - - depletion of electrolytes, fever, nausea, loss of appetite, couldn't get out of bed  . Cymbalta [Duloxetine Hcl]     Dulled her too much, difficulty urinating, change in vision  . Fosamax [Alendronate Sodium]     Caused chronic issues swallowing  . Lyrica [Pregabalin]     Unknown  . Neurontin [Gabapentin] Other (See Comments)    Dizziness and sedation (patient is tolerating in lower dose)  . Zanaflex [Tizanidine] Other (See Comments)    Could not sleep   Family History  Problem Relation Age of Onset  . Diabetes Sister   .  Diabetes Brother   . Breast cancer Maternal Aunt   . Colon cancer Neg Hx      Current Outpatient Medications (Cardiovascular):  .  ezetimibe (ZETIA) 10 MG tablet, Take 1 tablet (10 mg total) by mouth daily. .  furosemide (LASIX) 40 MG tablet, TAKE 1 TABLET ONCE DAILY. .  metoprolol succinate (TOPROL XL) 25 MG 24 hr tablet, Take 1 tablet (25 mg total) by mouth daily.  Current Outpatient Medications (Respiratory):  .  loratadine (CLARITIN) 10 MG tablet, Take 10 mg by mouth daily as needed for allergies. .  sodium chloride (OCEAN) 0.65 % SOLN nasal spray, Place 1 spray into both nostrils  4 (four) times daily as needed for congestion.  Current Outpatient Medications (Analgesics):  .  acetaminophen (TYLENOL) 500 MG tablet, Take 500 mg by mouth 2 (two) times daily.  Current Outpatient Medications (Hematological):  [name]  Cyanocobalamin (RA VITAMIN B-12 TR) 1000 MCG TBCR, Take 1,000 mcg by mouth daily.   Current Outpatient Medications (Other):  .  cholecalciferol (VITAMIN D) 1000 UNITS tablet, Take 2,000 Units by mouth daily.  [name]  omeprazole (PRILOSEC) 40 MG capsule, TAKE (1) CAPSULE TWICE DAILY. Vladimir Faster Glycol-Propyl Glycol (LUBRICANT EYE DROPS) 0.4-0.3 % SOLN, Place 1 drop into both eyes 3 (three) times daily as needed (dry/irritated eyes.). [name]  polyethylene glycol (MIRALAX / GLYCOLAX) packet, Take 17 g by mouth daily as needed (constipation).  .  potassium chloride SA (KLOR-CON) 20 MEQ tablet, TAKE 1 TABLET ONCE DAILY.   Reviewed prior external information including notes and imaging from  primary care provider As well as notes that were available from care everywhere and other healthcare systems.  Past medical history, social, surgical and family history all reviewed in electronic medical record.  No pertanent information unless stated regarding to the chief complaint.   Review of Systems:  No headache, visual changes, nausea, vomiting, diarrhea, constipation, dizziness, abdominal  pain, skin rash, fevers, chills, night sweats, weight loss, swollen lymph nodes, , chest pain, shortness of breath, mood changes. POSITIVE muscle aches, body aches, joint swelling  Objective  Blood pressure 120/60, pulse 63, height 5\' 1"  (1.549 m), weight 162 lb (73.5 kg), SpO2 96 %.   General: No apparent distress alert and oriented x3 mood and affect normal, dressed appropriately.  HEENT: Pupils equal, extraocular movements intact  Respiratory: Patient's speak in full sentences and does not appear short of breath  Cardiovascular: No lower extremity edema, non tender, no erythema  Neuro: Cranial nerves II through XII are intact, neurovascularly intact in all extremities with 2+ DTRs and 2+ pulses.  Gait antalgic Significant arthritic changes of multiple joints Back exam still shows tenderness to palpation mostly in the paraspinal musculature in the lumbar spine L4-L5 right greater than left.  Still positive straight leg test but worsening pain in the back itself with any type of extension greater than 10 degrees.     Impression and Recommendations:     This case required medical decision making of moderate complexity. The above documentation has been reviewed and is accurate and complete Lyndal Pulley, DO       Note: This dictation was prepared with Dragon dictation along with smaller phrase technology. Any transcriptional errors that result from this process are unintentional.

## 2019-11-25 NOTE — Patient Instructions (Addendum)
Good to see you  [facility] [phone] Virtual visit 3 weeks after epidural

## 2019-12-01 ENCOUNTER — Ambulatory Visit
Admission: RE | Admit: 2019-12-01 | Discharge: 2019-12-01 | Disposition: A | Discharge status: Home / Self Care | Payer: Medicare Other | Source: Ambulatory Visit | Attending: Family Medicine | Admitting: Family Medicine

## 2019-12-01 ENCOUNTER — Other Ambulatory Visit: Payer: Self-pay

## 2019-12-01 DIAGNOSIS — M549 Dorsalgia, unspecified: Secondary | ICD-10-CM

## 2019-12-01 DIAGNOSIS — M545 Low back pain: Secondary | ICD-10-CM | POA: Diagnosis not present

## 2019-12-01 DIAGNOSIS — G8929 Other chronic pain: Secondary | ICD-10-CM

## 2019-12-01 MED ORDER — METHYLPREDNISOLONE ACETATE 40 MG/ML INJ SUSP (RADIOLOG
120.0000 mg | Freq: Once | INTRAMUSCULAR | Status: AC
  Administered 2019-12-01: 120 mg via EPIDURAL

## 2019-12-01 MED ORDER — IOPAMIDOL (ISOVUE-M 200) INJECTION 41%
1.0000 mL | Freq: Once | INTRAMUSCULAR | Status: AC
  Administered 2019-12-01: 1 mL via EPIDURAL

## 2019-12-01 NOTE — Discharge Instructions (Signed)

## 2019-12-02 ENCOUNTER — Other Ambulatory Visit: Payer: Medicare Other

## 2019-12-06 ENCOUNTER — Ambulatory Visit: Payer: Medicare Other | Admitting: Family Medicine

## 2019-12-23 ENCOUNTER — Ambulatory Visit (INDEPENDENT_AMBULATORY_CARE_PROVIDER_SITE_OTHER): Payer: Medicare Other | Admitting: Family Medicine

## 2019-12-23 ENCOUNTER — Encounter: Payer: Self-pay | Admitting: Family Medicine

## 2019-12-23 DIAGNOSIS — E559 Vitamin D deficiency, unspecified: Secondary | ICD-10-CM | POA: Diagnosis not present

## 2019-12-23 DIAGNOSIS — M2550 Pain in unspecified joint: Secondary | ICD-10-CM

## 2019-12-23 DIAGNOSIS — E038 Other specified hypothyroidism: Secondary | ICD-10-CM

## 2019-12-23 DIAGNOSIS — M48062 Spinal stenosis, lumbar region with neurogenic claudication: Secondary | ICD-10-CM | POA: Diagnosis not present

## 2019-12-23 NOTE — Progress Notes (Signed)
Virtual Visit via Video Note  I connected with Kevina S Shurley on 12/23/19 at  3:00 PM EDT by a video enabled telemedicine application and verified that I am speaking with the correct person using two identifiers.  Had difficulty with virtual platform and did not do this mostly as a telephone.   Patient was alone  Location: Patient: At home alone on telephone Provider: In office   I discussed the limitations of evaluation and management by telemedicine and the availability of in person appointments. The patient expressed understanding and agreed to proceed.  History of Present Illness: 11/25/2019 Moderate to severe overall.  Did respond initially to the epidural and is starting to have increasing discomfort and pain again.  I believe patient will do very well with conservative therapy.  Patient will have the epidural again.  Due to her other comorbidities she is likely not a candidate for potential surgical intervention.  Could potentially consider the possibility of radiofrequency ablation but also fairly low likelihood.  Patient has many different allergies to medications which makes it difficult to treat in any other way as well.  We will do the epidural and then follow-up either virtually in 3 to 4 weeks.  Total time with patient reviewing patient's chart and images greater than 35 minutes  Epidural on May 5th, 2021  Patient states unfortunately no significant improvement with the epidural.  Still seems to be having pain on a daily basis.  Affecting some daily activities.  Patient wants to continue to avoid any type of surgical intervention but is wondering what else can be done.   Observations/Objective: Alert and oriented x3, asked good questions   Assessment and Plan: 85 year old female with chronic back pain with facet arthropathy and degenerative disc disease.  Failed to epidurals at this time with no significant benefit.  Would like to get laboratory work-up to rule out anything  else that could be contributing to the spinal stenosis and neurogenic claudication and patient symptomatology.  Patient is in agreement with the plan.  Follow-up with me again   Follow Up Instructions: Follow-up with me after labs    I discussed the assessment and treatment plan with the patient. The patient was provided an opportunity to ask questions and all were answered. The patient agreed with the plan and demonstrated an understanding of the instructions.   The patient was advised to call back or seek an in-person evaluation if the symptoms worsen or if the condition fails to improve as anticipated.  I provided 12 minutes of face-to-face time during this encounter.   Lyndal Pulley, DO

## 2019-12-27 ENCOUNTER — Other Ambulatory Visit (INDEPENDENT_AMBULATORY_CARE_PROVIDER_SITE_OTHER): Payer: Medicare Other

## 2019-12-27 DIAGNOSIS — M2550 Pain in unspecified joint: Secondary | ICD-10-CM

## 2019-12-27 DIAGNOSIS — E559 Vitamin D deficiency, unspecified: Secondary | ICD-10-CM | POA: Diagnosis not present

## 2019-12-27 DIAGNOSIS — E038 Other specified hypothyroidism: Secondary | ICD-10-CM | POA: Diagnosis not present

## 2019-12-27 LAB — CBC WITH DIFFERENTIAL/PLATELET
Basophils Absolute: 0 10*3/uL (ref 0.0–0.1)
Basophils Relative: 1 % (ref 0.0–3.0)
Eosinophils Absolute: 0.2 10*3/uL (ref 0.0–0.7)
Eosinophils Relative: 3.4 % (ref 0.0–5.0)
HCT: 33 % — ABNORMAL LOW (ref 36.0–46.0)
Hemoglobin: 11.3 g/dL — ABNORMAL LOW (ref 12.0–15.0)
Lymphocytes Relative: 23.4 % (ref 12.0–46.0)
Lymphs Abs: 1.1 10*3/uL (ref 0.7–4.0)
MCHC: 34.3 g/dL (ref 30.0–36.0)
MCV: 98.3 fl (ref 78.0–100.0)
Monocytes Absolute: 0.5 10*3/uL (ref 0.1–1.0)
Monocytes Relative: 10.5 % (ref 3.0–12.0)
Neutro Abs: 3 10*3/uL (ref 1.4–7.7)
Neutrophils Relative %: 61.7 % (ref 43.0–77.0)
Platelets: 272 10*3/uL (ref 150.0–400.0)
RBC: 3.36 Mil/uL — ABNORMAL LOW (ref 3.87–5.11)
RDW: 13.7 % (ref 11.5–15.5)
WBC: 4.8 10*3/uL (ref 4.0–10.5)

## 2019-12-27 LAB — COMPREHENSIVE METABOLIC PANEL
ALT: 8 U/L (ref 0–35)
AST: 10 U/L (ref 0–37)
Albumin: 4.1 g/dL (ref 3.5–5.2)
Alkaline Phosphatase: 35 U/L — ABNORMAL LOW (ref 39–117)
BUN: 16 mg/dL (ref 6–23)
CO2: 25 mEq/L (ref 19–32)
Calcium: 9 mg/dL (ref 8.4–10.5)
Chloride: 101 mEq/L (ref 96–112)
Creatinine, Ser: 1.16 mg/dL (ref 0.40–1.20)
GFR: 44.65 mL/min — ABNORMAL LOW (ref >60.00)
Glucose, Bld: 110 mg/dL — ABNORMAL HIGH (ref 70–99)
Potassium: 4 mEq/L (ref 3.5–5.1)
Sodium: 136 mEq/L (ref 135–145)
Total Bilirubin: 0.4 mg/dL (ref 0.2–1.2)
Total Protein: 6.6 g/dL (ref 6.0–8.3)

## 2019-12-27 LAB — FERRITIN: Ferritin: 33.4 ng/mL (ref 10.0–291.0)

## 2019-12-27 LAB — URIC ACID: Uric Acid, Serum: 6.7 mg/dL (ref 2.4–7.0)

## 2019-12-27 LAB — VITAMIN D 25 HYDROXY (VIT D DEFICIENCY, FRACTURES): VITD: 73.89 ng/mL (ref 30.00–100.00)

## 2019-12-27 LAB — IBC PANEL
Iron: 81 ug/dL (ref 42–145)
Saturation Ratios: 21.2 % (ref 20.0–50.0)
Transferrin: 273 mg/dL (ref 212.0–360.0)

## 2019-12-27 LAB — SEDIMENTATION RATE: Sed Rate: 9 mm/hr (ref 0–30)

## 2019-12-27 LAB — C-REACTIVE PROTEIN: CRP: <1 mg/dL (ref 0.5–20.0)

## 2019-12-27 LAB — TSH: TSH: 2.25 u[IU]/mL (ref 0.35–4.50)

## 2019-12-30 ENCOUNTER — Telehealth: Payer: Self-pay | Admitting: Family Medicine

## 2019-12-30 LAB — PROTEIN ELECTROPHORESIS, SERUM, WITH REFLEX
Albumin ELP: 3.8 g/dL (ref 3.8–4.8)
Alpha 1: 0.3 g/dL (ref 0.2–0.3)
Alpha 2: 0.7 g/dL (ref 0.5–0.9)
Beta 2: 0.3 g/dL (ref 0.2–0.5)
Beta Globulin: 0.4 g/dL (ref 0.4–0.6)
Gamma Globulin: 0.6 g/dL — ABNORMAL LOW (ref 0.8–1.7)
Total Protein: 6.1 g/dL (ref 6.1–8.1)

## 2019-12-30 LAB — ANA: Anti Nuclear Antibody (ANA): NEGATIVE

## 2019-12-30 LAB — ANGIOTENSIN CONVERTING ENZYME: Angiotensin-Converting Enzyme: 12 U/L (ref 9–67)

## 2019-12-30 LAB — RHEUMATOID FACTOR: Rheumatoid fact SerPl-aCnc: <14 IU/mL (ref <14)

## 2019-12-30 LAB — IFE INTERPRETATION: Immunofix Electr Int: NOT DETECTED

## 2019-12-30 LAB — PTH, INTACT AND CALCIUM
Calcium: 9.3 mg/dL (ref 8.6–10.4)
PTH: 31 pg/mL (ref 14–64)

## 2019-12-30 LAB — CYCLIC CITRUL PEPTIDE ANTIBODY, IGG: Cyclic Citrullin Peptide Ab: <16 UNITS

## 2019-12-30 LAB — CALCIUM, IONIZED: Calcium, Ion: 4.77 mg/dL — ABNORMAL LOW (ref 4.8–5.6)

## 2019-12-30 NOTE — Telephone Encounter (Signed)
Pt calling for results from labs done 12/27/19

## 2020-01-02 NOTE — Telephone Encounter (Signed)
All that it showed was iron deficient. Need iron 65mg  with 500mg  of vitamin C and may help the pain. Otherwise everything else is normal

## 2020-01-02 NOTE — Telephone Encounter (Signed)
Talked to patient.

## 2020-01-26 ENCOUNTER — Other Ambulatory Visit: Payer: Self-pay | Admitting: Internal Medicine

## 2020-01-26 DIAGNOSIS — Z1231 Encounter for screening mammogram for malignant neoplasm of breast: Secondary | ICD-10-CM

## 2020-01-31 ENCOUNTER — Ambulatory Visit (INDEPENDENT_AMBULATORY_CARE_PROVIDER_SITE_OTHER): Payer: Medicare Other | Admitting: *Deleted

## 2020-01-31 ENCOUNTER — Ambulatory Visit: Payer: Medicare Other | Admitting: Internal Medicine

## 2020-01-31 DIAGNOSIS — I495 Sick sinus syndrome: Secondary | ICD-10-CM

## 2020-01-31 LAB — CUP PACEART REMOTE DEVICE CHECK
Battery Remaining Longevity: 126 mo
Battery Remaining Percentage: 100 %
Brady Statistic RA Percent Paced: 55 %
Brady Statistic RV Percent Paced: 0 %
Date Time Interrogation Session: 20210706030100
Implantable Lead Implant Date: 20171009
Implantable Lead Implant Date: 20171009
Implantable Lead Location: 753859
Implantable Lead Location: 753860
Implantable Lead Model: 5076
Implantable Lead Model: 7741
Implantable Lead Serial Number: 785430
Implantable Pulse Generator Implant Date: 20171009
Lead Channel Impedance Value: 365 Ohm
Lead Channel Impedance Value: 985 Ohm
Lead Channel Pacing Threshold Amplitude: 1.9 V
Lead Channel Pacing Threshold Pulse Width: 0.4 ms
Lead Channel Setting Pacing Amplitude: 2 V
Lead Channel Setting Pacing Amplitude: 2.5 V
Lead Channel Setting Pacing Pulse Width: 0.4 ms
Lead Channel Setting Sensing Sensitivity: 2.5 mV
Pulse Gen Serial Number: 766467

## 2020-02-01 NOTE — Progress Notes (Signed)
Remote pacemaker transmission.   

## 2020-02-02 DIAGNOSIS — D509 Iron deficiency anemia, unspecified: Secondary | ICD-10-CM | POA: Insufficient documentation

## 2020-02-02 NOTE — Patient Instructions (Addendum)
  Blood work was ordered.  An xray was ordered.   You had a prolia injection today.   Medications reviewed and updated.  Changes include :   Start lexapro 5 mg daily for anxiety.   Your prescription(s) have been submitted to your pharmacy. Please take as directed and contact our office if you believe you are having problem(s) with the medication(s).  A referral was ordered for Dr Burt Knack - Cardiology.      Someone from their office will call you to schedule an appointment.     Please followup in 2 months

## 2020-02-02 NOTE — Progress Notes (Signed)
Subjective:    Patient ID: [name], female    DOB: 12/11/1934, 85 y.o.   MRN: [id number]  HPI The patient is here for follow up of their chronic medical problems, including HFpEF, hyperlipidemia, prediabetes, CKD, GERD.  She is here with her husband.  She is taking all of her medications as prescribed.  She is exercising some - walking when she can.    She she walks she gets SOB, she has jaw tightness, chest tightness and back pain - it resolves when she stops.  She does see Dr. Lovena Le for her pacemaker.  She had a cardiac catheterization earlier this year and did have an occlusion in the distal left circumflex that was not able to be stented.  She does not have a general cardiologist and wondered about seeing 1.  She knows she probably has anxiety.  If her husband has a bad day or has issues she worries about him and gets upset.  Years ago she was on paxil.  She does not feel that she is depressed.  She has chronic back pain.  She does see Dr. Tamala Julian for.  She states mid back pain.  She has seen surgeon years ago and was told that there is no surgery that would help with her back.  She has had epidurals and they have not helped.  She is limited in what she can do because she has to sit down after a short period of time.  She does experience intermittent dysphagia.  She is concerned that her hiatal hernia is causing some discomfort.  She does constipation and then sometimes diarrhea.  She is taking her omeprazole for the GERD.  She denies any GERD symptoms.  She does have tightness and swelling in her upper abdomen/lower chest.  Medications and allergies reviewed with patient and updated if appropriate.  Patient Active Problem List   Diagnosis Date Noted  . Iron deficiency anemia 02/02/2020  . Near syncope 09/27/2019  . Hypertension 09/20/2019  . Acute pain of right knee 08/03/2019  . (HFpEF) heart failure with preserved ejection fraction ([place]) 08/02/2019  . Pacemaker  07/19/2019  . CKD (chronic kidney disease) 01/30/2019  . Lumbar spinal stenosis 01/10/2018  . Osteoarthritis of both knees 12/15/2017  . Epigastric pain 09/09/2017  . Neuralgia 09/09/2017  . Claudication of calf muscles ([place]) 11/22/2016  . DOE (dyspnea on exertion) 11/22/2016  . Hyperreflexia of lower extremity 07/16/2016  . Bilateral leg paresthesia 07/16/2016  . Carotid arterial disease ([place]) 07/13/2016  . Poor balance 06/23/2016  . Weakness of both lower extremities 06/23/2016  . Sinus node dysfunction ([place]) 05/05/2016  . Right shoulder pain 03/14/2016  . Multinodular goiter (nontoxic) 02/21/2016  . Neck pain 02/15/2016  . Prediabetes 12/17/2015  . Dysphagia 12/17/2015  . Anxiety 11/13/2015  . T8 vertebral fracture ([place]) 04/09/2015  . Overweight 10/09/2011  . Allergic rhinitis, cause unspecified   . GERD (gastroesophageal reflux disease) 02/05/2011  . Vitamin B 12 deficiency 04/17/2009  . Hyperlipidemia 04/17/2009  . Osteoporosis 04/17/2009  . ADENOCARCINOMA, BREAST, HX OF 04/17/2009  . CONSTIPATION 03/08/2009  . COLONIC POLYPS, HYPERPLASTIC, HX OF 03/06/2009    Current Outpatient Medications on File Prior to Visit  Medication Sig Dispense Refill  . acetaminophen (TYLENOL) 500 MG tablet Take 500 mg by mouth 2 (two) times daily.    . cholecalciferol (VITAMIN D) 1000 UNITS tablet Take 2,000 Units by mouth daily.     . Cyanocobalamin (RA VITAMIN B-12 TR) 1000 MCG TBCR  Take 1,000 mcg by mouth daily.     [name] ezetimibe (ZETIA) 10 MG tablet Take 1 tablet (10 mg total) by mouth daily. 90 tablet 3  . furosemide (LASIX) 40 MG tablet TAKE 1 TABLET ONCE DAILY. 90 tablet 3  . loratadine (CLARITIN) 10 MG tablet Take 10 mg by mouth daily as needed for allergies.    . metoprolol succinate (TOPROL XL) 25 MG 24 hr tablet Take 1 tablet (25 mg total) by mouth daily. 90 tablet 3  . omeprazole (PRILOSEC) 40 MG capsule TAKE (1) CAPSULE TWICE DAILY. 60 capsule 11  . Polyethyl Glycol-Propyl Glycol  (LUBRICANT EYE DROPS) 0.4-0.3 % SOLN Place 1 drop into both eyes 3 (three) times daily as needed (dry/irritated eyes.).    [name] polyethylene glycol (MIRALAX / GLYCOLAX) packet Take 17 g by mouth daily as needed (constipation).     . Potassium Chloride ER 20 MEQ TBCR Take 1 tablet by mouth daily.    . potassium chloride SA (KLOR-CON) 20 MEQ tablet TAKE 1 TABLET ONCE DAILY. 90 tablet 3  . sodium chloride (OCEAN) 0.65 % SOLN nasal spray Place 1 spray into both nostrils 4 (four) times daily as needed for congestion.     No current facility-administered medications on file prior to visit.    Past Medical History:  Diagnosis Date  . Cancer of left breast ([place]) 1978  . Chronic thoracic back pain    "T8; fracture; 03/2015; no OR" (05/05/2016)  . GERD (gastroesophageal reflux disease)   . Heart murmur   . History of hiatal hernia   . Hyperlipidemia   . Hyperplastic colon polyp   . IBS (irritable bowel syndrome)   . Multiple thyroid nodules   . Osteoporosis    T8 compression fx 03/2015   . Personal history of radiation therapy   . Presence of permanent cardiac pacemaker   . Vitamin B12 deficiency     Past Surgical History:  Procedure Laterality Date  . ANTERIOR CERVICAL DECOMP/DISCECTOMY FUSION  2008   C5-6  . AUGMENTATION MAMMAPLASTY    . BACK SURGERY    . BREAST SURGERY    . CARDIAC CATHETERIZATION  2001  . EP IMPLANTABLE DEVICE N/A 05/05/2016   Procedure: Pacemaker Implant;  Surgeon: Evans Lance, MD;  Location: [facility];  Service: Cardiovascular;  Laterality: N/A;  . EXCISIONAL HEMORRHOIDECTOMY  1984   With subsequent correction of surgery  . INSERT / REPLACE / REMOVE PACEMAKER    . INTRAVASCULAR PRESSURE WIRE/FFR STUDY N/A 10/07/2019   Procedure: INTRAVASCULAR PRESSURE WIRE/FFR STUDY;  Surgeon: Jettie Booze, MD;  Location: [facility];  Service: Cardiovascular;  Laterality: N/A;  . KNEE ARTHROSCOPY Left    "meniscus tear"  . LEFT HEART CATH AND CORONARY  ANGIOGRAPHY N/A 10/07/2019   Procedure: LEFT HEART CATH AND CORONARY ANGIOGRAPHY;  Surgeon: Jettie Booze, MD;  Location: [facility];  Service: Cardiovascular;  Laterality: N/A;  . MASTECTOMY Left 1978  . PLACEMENT OF BREAST IMPLANTS Left 1981  . SHOULDER ARTHROSCOPY W/ ROTATOR CUFF REPAIR Left 02/2006   "tear"  . TUBAL LIGATION      Social History   Socioeconomic History  . Marital status: Married    Spouse name: Not on file  . Number of children: 2  . Years of education: Not on file  . Highest education level: Not on file  Occupational History  . Occupation: retired  Tobacco Use  . Smoking status: Never Smoker  . Smokeless tobacco:  Never Used  Vaping Use  . Vaping Use: Never used  Substance and Sexual Activity  . Alcohol use: No    Alcohol/week: 0.0 standard drinks  . Drug use: No  . Sexual activity: Not Currently  Other Topics Concern  . Not on file  Social History Narrative   Housewife, Lives with spouse, 2 children   Social Determinants of Health   Financial Resource Strain:   . Difficulty of Paying Living Expenses:   Food Insecurity:   . Worried About Charity fundraiser in the Last Year:   . Arboriculturist in the Last Year:   Transportation Needs:   . Film/video editor (Medical):   [name] Lack of Transportation (Non-Medical):   Physical Activity:   . Days of Exercise per Week:   . Minutes of Exercise per Session:   Stress:   . Feeling of Stress :   Social Connections:   . Frequency of Communication with Friends and Family:   . Frequency of Social Gatherings with Friends and Family:   . Attends Religious Services:   . Active Member of Clubs or Organizations:   . Attends Archivist Meetings:   [name] Marital Status:     Family History  Problem Relation Age of Onset  . Diabetes Sister   . Diabetes Brother   . Breast cancer Maternal Aunt   . Colon cancer Neg Hx     Review of Systems     Objective:   Vitals:   02/03/20 0936   BP: 132/80  Pulse: 77  Temp: 98 F (36.7 C)  SpO2: 99%   BP Readings from Last 3 Encounters:  02/03/20 132/80  12/01/19 (!) 169/56  11/25/19 120/60   Wt Readings from Last 3 Encounters:  02/03/20 162 lb (73.5 kg)  11/25/19 162 lb (73.5 kg)  11/11/19 162 lb (73.5 kg)   Body mass index is 30.61 kg/m.   Physical Exam    Constitutional: Appears well-developed and well-nourished. No distress.  HENT:  Head: Normocephalic and atraumatic.  Neck: Neck supple. No tracheal deviation present. No thyromegaly present.  No cervical lymphadenopathy Cardiovascular: Normal rate, regular rhythm and normal heart sounds.   No murmur heard. No carotid bruit .  No edema Pulmonary/Chest: Effort normal and breath sounds normal. No respiratory distress. No has no wheezes. No rales.  Skin: Skin is warm and dry. Not diaphoretic.  Psychiatric: Normal mood and affect. Behavior is normal.      Assessment & Plan:    See Problem List for Assessment and Plan of chronic medical problems.    This visit occurred during the SARS-CoV-2 public health emergency.  Safety protocols were in place, including screening questions prior to the visit, additional usage of staff PPE, and extensive cleaning of exam room while observing appropriate contact time as indicated for disinfecting solutions.

## 2020-02-03 ENCOUNTER — Ambulatory Visit (INDEPENDENT_AMBULATORY_CARE_PROVIDER_SITE_OTHER): Payer: Medicare Other | Admitting: Internal Medicine

## 2020-02-03 ENCOUNTER — Other Ambulatory Visit: Payer: Self-pay

## 2020-02-03 ENCOUNTER — Ambulatory Visit: Payer: Medicare Other

## 2020-02-03 ENCOUNTER — Ambulatory Visit (INDEPENDENT_AMBULATORY_CARE_PROVIDER_SITE_OTHER): Payer: Medicare Other

## 2020-02-03 ENCOUNTER — Encounter: Payer: Self-pay | Admitting: Internal Medicine

## 2020-02-03 VITALS — BP 132/80 | HR 77 | Temp 98.0°F | Ht 61.0 in | Wt 162.0 lb

## 2020-02-03 DIAGNOSIS — M810 Age-related osteoporosis without current pathological fracture: Secondary | ICD-10-CM

## 2020-02-03 DIAGNOSIS — K219 Gastro-esophageal reflux disease without esophagitis: Secondary | ICD-10-CM

## 2020-02-03 DIAGNOSIS — G8929 Other chronic pain: Secondary | ICD-10-CM

## 2020-02-03 DIAGNOSIS — N1831 Chronic kidney disease, stage 3a: Secondary | ICD-10-CM

## 2020-02-03 DIAGNOSIS — M47814 Spondylosis without myelopathy or radiculopathy, thoracic region: Secondary | ICD-10-CM | POA: Diagnosis not present

## 2020-02-03 DIAGNOSIS — M546 Pain in thoracic spine: Secondary | ICD-10-CM

## 2020-02-03 DIAGNOSIS — I10 Essential (primary) hypertension: Secondary | ICD-10-CM

## 2020-02-03 DIAGNOSIS — E782 Mixed hyperlipidemia: Secondary | ICD-10-CM | POA: Diagnosis not present

## 2020-02-03 DIAGNOSIS — K5900 Constipation, unspecified: Secondary | ICD-10-CM

## 2020-02-03 DIAGNOSIS — R7303 Prediabetes: Secondary | ICD-10-CM

## 2020-02-03 DIAGNOSIS — D509 Iron deficiency anemia, unspecified: Secondary | ICD-10-CM | POA: Diagnosis not present

## 2020-02-03 DIAGNOSIS — R1310 Dysphagia, unspecified: Secondary | ICD-10-CM

## 2020-02-03 DIAGNOSIS — I5032 Chronic diastolic (congestive) heart failure: Secondary | ICD-10-CM | POA: Diagnosis not present

## 2020-02-03 DIAGNOSIS — F419 Anxiety disorder, unspecified: Secondary | ICD-10-CM

## 2020-02-03 DIAGNOSIS — R0602 Shortness of breath: Secondary | ICD-10-CM | POA: Diagnosis not present

## 2020-02-03 MED ORDER — ESCITALOPRAM OXALATE 5 MG PO TABS: 5.0000 mg | ORAL_TABLET | Freq: Every day | ORAL | 5 refills | Status: DC

## 2020-02-03 MED ORDER — DENOSUMAB 60 MG/ML ~~LOC~~ SOSY
60.0000 mg | PREFILLED_SYRINGE | Freq: Once | SUBCUTANEOUS | Status: AC
  Administered 2020-02-03: 60 mg via SUBCUTANEOUS

## 2020-02-03 NOTE — Assessment & Plan Note (Signed)
New Has had anxiety in past and was on paxil She would benefit from an SSRI - trial of lexapro 5 mg daily  F/u in 2 months, sooner if needed

## 2020-02-03 NOTE — Assessment & Plan Note (Signed)
Chronic Will check xray - h/o of severe compression fracture which is contributing but will evaluate for other causes in that area

## 2020-02-03 NOTE — Assessment & Plan Note (Addendum)
Chronic GERD controlled - no gerd symptoms Has swelling and tightness and some dysphagia Continue twice daily medication We will refer to Dr. Fuller Plan for further evaluation

## 2020-02-03 NOTE — Assessment & Plan Note (Signed)
Chronic Intolerant to statins On zetia - no side effects Lipids, cmp

## 2020-02-03 NOTE — Assessment & Plan Note (Signed)
Chronic euvolemic Continue current medications 

## 2020-02-03 NOTE — Assessment & Plan Note (Signed)
Chronic BP well controlled Current regimen effective and well tolerated Continue current medications at current doses cmp  

## 2020-02-03 NOTE — Assessment & Plan Note (Signed)
Intermittent dysphasia H/o esoph stricture/dilation Referred to Dr Fuller Plan

## 2020-02-03 NOTE — Assessment & Plan Note (Signed)
Chronic Check a1c Low sugar / carb diet Stressed regular exercise  

## 2020-02-03 NOTE — Assessment & Plan Note (Signed)
Chronic Taking iron Iron panel, cbc

## 2020-02-03 NOTE — Assessment & Plan Note (Signed)
Chronic cmp 

## 2020-02-03 NOTE — Assessment & Plan Note (Signed)
Chronic Due for prolia - given today Taking vitamin d 2000 units daily Not taking calcium - stomach upset

## 2020-02-04 LAB — LIPID PANEL
Cholesterol: 215 mg/dL — ABNORMAL HIGH (ref <200)
HDL: 58 mg/dL (ref >=50)
LDL Cholesterol (Calc): 132 mg/dL (calc) — ABNORMAL HIGH
Non-HDL Cholesterol (Calc): 157 mg/dL (calc) — ABNORMAL HIGH (ref <130)
Total CHOL/HDL Ratio: 3.7 (calc) (ref <5.0)
Triglycerides: 133 mg/dL (ref <150)

## 2020-02-04 LAB — CBC WITH DIFFERENTIAL/PLATELET
Absolute Monocytes: 857 cells/uL (ref 200–950)
Basophils Absolute: 43 cells/uL (ref 0–200)
Basophils Relative: 0.6 %
Eosinophils Absolute: 173 cells/uL (ref 15–500)
Eosinophils Relative: 2.4 %
HCT: 33.2 % — ABNORMAL LOW (ref 35.0–45.0)
Hemoglobin: 11 g/dL — ABNORMAL LOW (ref 11.7–15.5)
Lymphs Abs: 1462 cells/uL (ref 850–3900)
MCH: 33.2 pg — ABNORMAL HIGH (ref 27.0–33.0)
MCHC: 33.1 g/dL (ref 32.0–36.0)
MCV: 100.3 fL — ABNORMAL HIGH (ref 80.0–100.0)
MPV: 11.3 fL (ref 7.5–12.5)
Monocytes Relative: 11.9 %
Neutro Abs: 4666 cells/uL (ref 1500–7800)
Neutrophils Relative %: 64.8 %
Platelets: 294 10*3/uL (ref 140–400)
RBC: 3.31 10*6/uL — ABNORMAL LOW (ref 3.80–5.10)
RDW: 12.6 % (ref 11.0–15.0)
Total Lymphocyte: 20.3 %
WBC: 7.2 10*3/uL (ref 3.8–10.8)

## 2020-02-04 LAB — HEMOGLOBIN A1C
Hgb A1c MFr Bld: 5.9 % of total Hgb — ABNORMAL HIGH (ref <5.7)
Mean Plasma Glucose: 123 (calc)
eAG (mmol/L): 6.8 (calc)

## 2020-02-04 LAB — COMPREHENSIVE METABOLIC PANEL
AG Ratio: 2 (calc) (ref 1.0–2.5)
ALT: 10 U/L (ref 6–29)
AST: 11 U/L (ref 10–35)
Albumin: 4.3 g/dL (ref 3.6–5.1)
Alkaline phosphatase (APISO): 53 U/L (ref 37–153)
BUN/Creatinine Ratio: 17 (calc) (ref 6–22)
BUN: 20 mg/dL (ref 7–25)
CO2: 26 mmol/L (ref 20–32)
Calcium: 9.6 mg/dL (ref 8.6–10.4)
Chloride: 101 mmol/L (ref 98–110)
Creat: 1.2 mg/dL — ABNORMAL HIGH (ref 0.60–0.88)
Globulin: 2.2 g/dL (calc) (ref 1.9–3.7)
Glucose, Bld: 98 mg/dL (ref 65–99)
Potassium: 4.7 mmol/L (ref 3.5–5.3)
Sodium: 137 mmol/L (ref 135–146)
Total Bilirubin: 0.4 mg/dL (ref 0.2–1.2)
Total Protein: 6.5 g/dL (ref 6.1–8.1)

## 2020-02-04 LAB — TSH: TSH: 1.85 mIU/L (ref 0.40–4.50)

## 2020-02-04 LAB — IRON,TIBC AND FERRITIN PANEL
%SAT: 25 % (calc) (ref 16–45)
Ferritin: 34 ng/mL (ref 16–288)
Iron: 86 ug/dL (ref 45–160)
TIBC: 339 mcg/dL (calc) (ref 250–450)

## 2020-02-04 LAB — VITAMIN D 25 HYDROXY (VIT D DEFICIENCY, FRACTURES): Vit D, 25-Hydroxy: 42 ng/mL (ref 30–100)

## 2020-02-07 ENCOUNTER — Other Ambulatory Visit: Payer: Self-pay

## 2020-02-07 ENCOUNTER — Ambulatory Visit
Admission: RE | Admit: 2020-02-07 | Discharge: 2020-02-07 | Disposition: A | Discharge status: Home / Self Care | Payer: Medicare Other | Source: Ambulatory Visit | Attending: Internal Medicine | Admitting: Internal Medicine

## 2020-02-07 DIAGNOSIS — Z1231 Encounter for screening mammogram for malignant neoplasm of breast: Secondary | ICD-10-CM

## 2020-02-08 ENCOUNTER — Telehealth: Payer: Self-pay

## 2020-02-08 NOTE — Telephone Encounter (Signed)
Can stop both medications - does not need to taper

## 2020-02-08 NOTE — Telephone Encounter (Signed)
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 Lovena Le.    Pt c/o medication issue:  1. Name of Medication: ezetimibe (ZETIA) 10 MG tablet   2. How are you currently taking this medication (dosage and times per day)? One time a day   3. Are you having a reaction (difficulty breathing--STAT)? No   4. What is your medication issue? Come off medication bad shaking in arms and how long should she wait    Pt c/o medication issue:  1. Name of Medication: escitalopram (LEXAPRO) 5 MG tablet  2. How are you currently taking this medication (dosage and times per day)? One time a day    3. Are you having a reaction (difficulty breathing--STAT)? No   4. What is your medication issue? Come off medication bad shaking in arms and how long should she wait

## 2020-02-08 NOTE — Telephone Encounter (Signed)
Spoke with patient today. 

## 2020-02-14 ENCOUNTER — Other Ambulatory Visit: Payer: Self-pay

## 2020-02-14 ENCOUNTER — Encounter: Payer: Self-pay | Admitting: Internal Medicine

## 2020-02-14 ENCOUNTER — Ambulatory Visit (INDEPENDENT_AMBULATORY_CARE_PROVIDER_SITE_OTHER): Payer: Medicare Other | Admitting: Internal Medicine

## 2020-02-14 VITALS — BP 140/50 | HR 60 | Ht 61.0 in | Wt 161.8 lb

## 2020-02-14 DIAGNOSIS — I495 Sick sinus syndrome: Secondary | ICD-10-CM

## 2020-02-14 DIAGNOSIS — Z950 Presence of cardiac pacemaker: Secondary | ICD-10-CM

## 2020-02-14 DIAGNOSIS — I10 Essential (primary) hypertension: Secondary | ICD-10-CM | POA: Diagnosis not present

## 2020-02-14 NOTE — Progress Notes (Signed)
HPI [name] returns today for followup. She is a pleasant 85 yo woman with a h/o HTN, diastolic heart failure, obesity and sinus node dysfunction, s/p PPM insertion. She has class 2 CHF symptoms. No angina. She has an occluded apical LAD. She notes dyspnea with exertion.  Allergies  Allergen Reactions  . Statins     Lipitor caused hospitalization - - depletion of electrolytes, fever, nausea, loss of appetite, couldn't get out of bed  . Cymbalta [Duloxetine Hcl] Other (See Comments)    Dulled her too much, difficulty urinating, change in vision  . Fosamax [Alendronate Sodium] Other (See Comments)    Caused chronic issues swallowing  . Amitriptyline     hallucinations  . Lyrica [Pregabalin]     Unknown  . Neurontin [Gabapentin] Other (See Comments)    Dizziness and sedation (patient is tolerating in lower dose)  . Zanaflex [Tizanidine] Other (See Comments)    Could not sleep     Current Outpatient Medications  Medication Sig Dispense Refill  . acetaminophen (TYLENOL) 500 MG tablet Take 500 mg by mouth 2 (two) times daily.    . cholecalciferol (VITAMIN D) 1000 UNITS tablet Take 2,000 Units by mouth daily.     . Cyanocobalamin (RA VITAMIN B-12 TR) 1000 MCG TBCR Take 1,000 mcg by mouth daily.     [name] escitalopram (LEXAPRO) 5 MG tablet Take 1 tablet (5 mg total) by mouth daily. 30 tablet 5  . furosemide (LASIX) 40 MG tablet TAKE 1 TABLET ONCE DAILY. 90 tablet 3  . loratadine (CLARITIN) 10 MG tablet Take 10 mg by mouth daily as needed for allergies.    . metoprolol succinate (TOPROL XL) 25 MG 24 hr tablet Take 1 tablet (25 mg total) by mouth daily. 90 tablet 3  . omeprazole (PRILOSEC) 40 MG capsule TAKE (1) CAPSULE TWICE DAILY. 60 capsule 11  . Polyethyl Glycol-Propyl Glycol (LUBRICANT EYE DROPS) 0.4-0.3 % SOLN Place 1 drop into both eyes 3 (three) times daily as needed (dry/irritated eyes.).    [name] polyethylene glycol (MIRALAX / GLYCOLAX) packet Take 17 g by mouth daily as needed  (constipation).     . Potassium Chloride ER 20 MEQ TBCR Take 1 tablet by mouth daily.    . potassium chloride SA (KLOR-CON) 20 MEQ tablet TAKE 1 TABLET ONCE DAILY. 90 tablet 3  . sodium chloride (OCEAN) 0.65 % SOLN nasal spray Place 1 spray into both nostrils 4 (four) times daily as needed for congestion.     No current facility-administered medications for this visit.     Past Medical History:  Diagnosis Date  . Cancer of left breast ([place]) 1978  . Chronic thoracic back pain    "T8; fracture; 03/2015; no OR" (05/05/2016)  . GERD (gastroesophageal reflux disease)   . Heart murmur   . History of hiatal hernia   . Hyperlipidemia   . Hyperplastic colon polyp   . IBS (irritable bowel syndrome)   . Multiple thyroid nodules   . Osteoporosis    T8 compression fx 03/2015   . Personal history of radiation therapy   . Presence of permanent cardiac pacemaker   . Vitamin B12 deficiency     ROS:   All systems reviewed and negative except as noted in the HPI.   Past Surgical History:  Procedure Laterality Date  . ANTERIOR CERVICAL DECOMP/DISCECTOMY FUSION  2008   C5-6  . AUGMENTATION MAMMAPLASTY    . BACK SURGERY    . BREAST SURGERY    .  CARDIAC CATHETERIZATION  2001  . EP IMPLANTABLE DEVICE N/A 05/05/2016   Procedure: Pacemaker Implant;  Surgeon: Evans Lance, MD;  Location: [facility];  Service: Cardiovascular;  Laterality: N/A;  . EXCISIONAL HEMORRHOIDECTOMY  1984   With subsequent correction of surgery  . INSERT / REPLACE / REMOVE PACEMAKER    . INTRAVASCULAR PRESSURE WIRE/FFR STUDY N/A 10/07/2019   Procedure: INTRAVASCULAR PRESSURE WIRE/FFR STUDY;  Surgeon: Jettie Booze, MD;  Location: [facility];  Service: Cardiovascular;  Laterality: N/A;  . KNEE ARTHROSCOPY Left    "meniscus tear"  . LEFT HEART CATH AND CORONARY ANGIOGRAPHY N/A 10/07/2019   Procedure: LEFT HEART CATH AND CORONARY ANGIOGRAPHY;  Surgeon: Jettie Booze, MD;  Location: [facility];  Service: Cardiovascular;  Laterality: N/A;  . MASTECTOMY Left 1978  . PLACEMENT OF BREAST IMPLANTS Left 1981  . REDUCTION MAMMAPLASTY Right   . SHOULDER ARTHROSCOPY W/ ROTATOR CUFF REPAIR Left 02/2006   "tear"  . TUBAL LIGATION       Family History  Problem Relation Age of Onset  . Diabetes Sister   . Diabetes Brother   . Breast cancer Maternal Aunt   . Colon cancer Neg Hx      Social History   Socioeconomic History  . Marital status: Married    Spouse name: Not on file  . Number of children: 2  . Years of education: Not on file  . Highest education level: Not on file  Occupational History  . Occupation: retired  Tobacco Use  . Smoking status: Never Smoker  . Smokeless tobacco: Never Used  Vaping Use  . Vaping Use: Never used  Substance and Sexual Activity  . Alcohol use: No    Alcohol/week: 0.0 standard drinks  . Drug use: No  . Sexual activity: Not Currently  Other Topics Concern  . Not on file  Social History Narrative   Housewife, Lives with spouse, 2 children   Social Determinants of Health   Financial Resource Strain:   . Difficulty of Paying Living Expenses:   Food Insecurity:   . Worried About Charity fundraiser in the Last Year:   . Arboriculturist in the Last Year:   Transportation Needs:   . Film/video editor (Medical):   [name] Lack of Transportation (Non-Medical):   Physical Activity:   . Days of Exercise per Week:   . Minutes of Exercise per Session:   Stress:   . Feeling of Stress :   Social Connections:   . Frequency of Communication with Friends and Family:   . Frequency of Social Gatherings with Friends and Family:   . Attends Religious Services:   . Active Member of Clubs or Organizations:   . Attends Archivist Meetings:   [name] Marital Status:   Intimate Partner Violence:   . Fear of Current or Ex-Partner:   . Emotionally Abused:   [name] Physically Abused:   . Sexually Abused:      BP (!) 140/50   Pulse 60   Ht  5\' 1"  (1.549 m)   Wt 161 lb 12.8 oz (73.4 kg)   SpO2 97%   BMI 30.57 kg/m   Physical Exam:  Obese, Well appearing woman, NAD HEENT: Unremarkable Neck:  7 cm JVD, no thyromegally Lymphatics:  No adenopathy Back:  No CVA tenderness Lungs:  Clear with no wheezes HEART:  Regular rate rhythm, no murmurs, no rubs, no clicks Abd:  soft, positive  bowel sounds, no organomegally, no rebound, no guarding Ext:  2 plus pulses, no edema, no cyanosis, no clubbing Skin:  No rashes no nodules Neuro:  CN II through XII intact, motor grossly intact  EKG - nsr with atrial pacing  DEVICE  Normal device function.  See PaceArt for details.   Assess/Plan: 1. Sinus node dysfunction - she is asymptomatic, s/p PPM insertion. 2. CHronic diastolic heart failure - her symptoms are class 2. I encouraged her to increase her physical activity 3. Obesity - I encouraged her to increase her activity and maintain a low fat diet. 4. CAD - she has an occluded apical LAD but has not angina. She will undergo watchful waiting.  Mikle Bosworth.D.

## 2020-02-14 NOTE — Patient Instructions (Addendum)
Medication Instructions:  Your physician recommends that you continue on your current medications as directed. Please refer to the Current Medication list given to you today.  *If you need a refill on your cardiac medications before your next appointment, please call your pharmacy*  Lab Work: None ordered.  If you have labs (blood work) drawn today and your tests are completely normal, you will receive your results only by: [name] MyChart Message (if you have MyChart) OR . A paper copy in the mail If you have any lab test that is abnormal or we need to change your treatment, we will call you to review the results.  Testing/Procedures: None ordered.  Follow-Up: At [facility], you and your health needs are our priority.  As part of our continuing mission to provide you with exceptional heart care, we have created designated Provider Care Teams.  These Care Teams include your primary Cardiologist (physician) and Advanced Practice Providers (APPs -  Physician Assistants and Nurse Practitioners) who all work together to provide you with the care you need, when you need it.  We recommend signing up for the patient portal called "MyChart".  Sign up information is provided on this After Visit Summary.  MyChart is used to connect with patients for Virtual Visits (Telemedicine).  Patients are able to view lab/test results, encounter notes, upcoming appointments, etc.  Non-urgent messages can be sent to your provider as well.   To learn more about what you can do with MyChart, go to [email or web address].    Your next appointment:   Your physician wants you to follow-up in: 6 months with Dr. Lovena Le.   Remote monitoring is used to monitor your Pacemaker from home. This monitoring reduces the number of office visits required to check your device to one time per year. It allows [place] to keep an eye on the functioning of your device to ensure it is working properly. You are scheduled for a device check from  home on 05/01/20. You may send your transmission at any time that day. If you have a wireless device, the transmission will be sent automatically. After your physician reviews your transmission, you will receive a postcard with your next transmission date.  Other Instructions:

## 2020-03-02 ENCOUNTER — Telehealth: Payer: Self-pay

## 2020-03-02 NOTE — Telephone Encounter (Signed)
The pt had questions about the letter bsx. She wanted to know if there anything she has to do and what to do with the discount code? I gave her the number to bsx tech support to get answers to her questions.

## 2020-03-16 ENCOUNTER — Ambulatory Visit: Payer: Medicare Other | Admitting: Internal Medicine

## 2020-03-19 DIAGNOSIS — D1801 Hemangioma of skin and subcutaneous tissue: Secondary | ICD-10-CM | POA: Diagnosis not present

## 2020-03-19 DIAGNOSIS — L814 Other melanin hyperpigmentation: Secondary | ICD-10-CM | POA: Diagnosis not present

## 2020-03-19 DIAGNOSIS — L578 Other skin changes due to chronic exposure to nonionizing radiation: Secondary | ICD-10-CM | POA: Diagnosis not present

## 2020-03-19 DIAGNOSIS — L853 Xerosis cutis: Secondary | ICD-10-CM | POA: Diagnosis not present

## 2020-03-19 DIAGNOSIS — Z85828 Personal history of other malignant neoplasm of skin: Secondary | ICD-10-CM | POA: Diagnosis not present

## 2020-03-19 DIAGNOSIS — L821 Other seborrheic keratosis: Secondary | ICD-10-CM | POA: Diagnosis not present

## 2020-03-19 DIAGNOSIS — C44319 Basal cell carcinoma of skin of other parts of face: Secondary | ICD-10-CM | POA: Diagnosis not present

## 2020-03-19 DIAGNOSIS — D485 Neoplasm of uncertain behavior of skin: Secondary | ICD-10-CM | POA: Diagnosis not present

## 2020-03-20 ENCOUNTER — Encounter: Payer: Self-pay | Admitting: Gastroenterology

## 2020-03-20 ENCOUNTER — Telehealth: Payer: Self-pay | Admitting: Internal Medicine

## 2020-03-20 ENCOUNTER — Other Ambulatory Visit (HOSPITAL_COMMUNITY): Payer: Self-pay

## 2020-03-20 ENCOUNTER — Ambulatory Visit (INDEPENDENT_AMBULATORY_CARE_PROVIDER_SITE_OTHER): Payer: Medicare Other | Admitting: Gastroenterology

## 2020-03-20 VITALS — BP 116/50 | HR 78 | Ht 61.0 in | Wt 162.0 lb

## 2020-03-20 DIAGNOSIS — K219 Gastro-esophageal reflux disease without esophagitis: Secondary | ICD-10-CM | POA: Diagnosis not present

## 2020-03-20 DIAGNOSIS — R1310 Dysphagia, unspecified: Secondary | ICD-10-CM | POA: Diagnosis not present

## 2020-03-20 DIAGNOSIS — R059 Cough, unspecified: Secondary | ICD-10-CM

## 2020-03-20 NOTE — Telephone Encounter (Signed)
Pt c/o swelling: STAT is pt has developed SOB within 24 hours  1) How much weight have you gained and in what time span? no  2) If swelling, where is the swelling located? Left arm and foot   3) Are you currently taking a fluid pill? Yes   4) Are you currently SOB? She normally feels that way all the time  5) Do you have a log of your daily weights (if so, list)? No   6) Have you gained 3 pounds in a day or 5 pounds in a week? no  7) Have you traveled recently? No   She is wondering if her medication can be cause this. She is not sure which one.

## 2020-03-20 NOTE — Patient Instructions (Signed)
You have been scheduled for a modified barium swallow/barium swallow on 03/27/20 at 10:00am. NOTHING TO EAT OR DRINK 3 HOURS PRIOR TO TEST. Please arrive 15 minutes prior to your test for registration. You will go to [facility] (1st Floor) for your appointment. Should you need to cancel or reschedule your appointment, please contact [phone] [facility] [place]) or [phone] [place]). _____________________________________________________________________ A Modified Barium Swallow Study, or MBS, is a special x-ray that is taken to check swallowing skills. It is carried out by a Stage manager and a Psychologist, clinical (SLP). During this test, yourmouth, throat, and esophagus, a muscular tube which connects your mouth to your stomach, is checked. The test will help you, your doctor, and the SLP plan what types of foods and liquids are easier for you to swallow. The SLP will also identify positions and ways to help you swallow more easily and safely. What will happen during an MBS? You will be taken to an x-ray room and seated comfortably. You will be asked to swallow small amounts of food and liquid mixed with barium. Barium is a liquid or paste that allows images of your mouth, throat and esophagus to be seen on x-ray. The x-ray captures moving images of the food you are swallowing as it travels from your mouth through your throat and into your esophagus. This test helps identify whether food or liquid is entering your lungs (aspiration). The test also shows which part of your mouth or throat lacks strength or coordination to move the food or liquid in the right direction. This test typically takes 30 minutes to 1 hour to complete. ____________________________________________________________  A barium swallow is an examination that concentrates on views of the esophagus. This tends to be a double contrast exam (barium and two liquids which, when combined, create a gas to distend the wall of  the oesophagus) or single contrast (non-ionic iodine based). The study is usually tailored to your symptoms so a good history is essential. Attention is paid during the study to the form, structure and configuration of the esophagus, looking for functional disorders (such as aspiration, dysphagia, achalasia, motility and reflux) EXAMINATION You may be asked to change into a gown, depending on the type of swallow being performed. A radiologist and radiographer will perform the procedure. The radiologist will advise you of the type of contrast selected for your procedure and direct you during the exam. You will be asked to stand, sit or lie in several different positions and to hold a small amount of fluid in your mouth before being asked to swallow while the imaging is performed .In some instances you may be asked to swallow barium coated marshmallows to assess the motility of a solid food bolus. The exam can be recorded as a digital or video fluoroscopy procedure. POST PROCEDURE It will take 1-2 days for the barium to pass through your system. To facilitate this, it is important, unless otherwise directed, to increase your fluids for the next 24-48hrs and to resume your normal diet.  This test typically takes about 30 minutes to perform. ____________________________________________________________  [name] have been scheduled for an endoscopy. Please follow written instructions given to you at your visit today. If you use inhalers (even only as needed), please bring them with you on the day of your procedure.   Due to recent changes in healthcare laws, you may see the results of your imaging and laboratory studies on MyChart before your provider has had a chance to review them.  We understand that in some cases there may be results that are confusing or concerning to you. Not all laboratory results come back in the same time frame and the provider may be waiting for multiple results in order to interpret  others.  Please give [place] 48 hours in order for your provider to thoroughly review all the results before contacting the office for clarification of your results.   Thank you for choosing me and [facility].  [name] Riffle. Dagoberto Ligas., MD., [name]

## 2020-03-20 NOTE — Telephone Encounter (Signed)
Spoke with patient who is reporting having swelling in her left foot, leg and arm.  She reports she has had this for over a month.  She denies any new SOB, CP or denies wt gain.  She denies any injury to her left side initially but then states about 2 months ago she fell out of bed onto the left side.  She had bruising then but it has resolved.  She is reporting frequent falls out of bed recently and has been sleeping on her couch or using her husband's bed rail.  She is scheduled to see her PCP MD - Dr Quay Burow the first week in September.  Advised pt she would be excellent to further evaluate both the increased falls and the swelling she is having on her left side.  Pt states understanding and was grateful for the call back and information.

## 2020-03-20 NOTE — Progress Notes (Signed)
° ° °  History of Present Illness: This is an 85 year old female who relates intermittent difficulties swallowing liquids and some pills.  She describes choking and coughing with liquids, particularly cold liquids, so she avoids cold drinks.  She had increase in symptoms recently however her symptoms are back to her baseline.  The symptoms have occurred for years.  She has occasional difficulty swallowing certain pills.  She has no difficulties with solid food.  She does not note any heartburn or regurgitation.  Her appetite is stable and her weight is stable.  Constipation is managed with MiraLAX.  Current Medications, Allergies, Past Medical History, Past Surgical History, Family History and Social History were reviewed in [employer] record.   Physical Exam: General: Well developed, well nourished, no acute distress Head: Normocephalic and atraumatic Eyes:  sclerae anicteric, EOMI Ears: Normal auditory acuity Mouth: Not examined, mask on during Covid-19 pandemic Lungs: Clear throughout to auscultation Heart: Regular rate and rhythm; no murmurs, rubs or bruits Abdomen: Soft, non tender and non distended. No masses, hepatosplenomegaly or hernias noted. Normal Bowel sounds Rectal: Not done Musculoskeletal: Symmetrical with no gross deformities  Pulses:  Normal pulses noted Extremities: No clubbing, cyanosis, edema or deformities noted Neurological: Alert oriented x 4, grossly nonfocal Psychological:  Alert and cooperative. Anxious.    Assessment and Recommendations:  1. Dysphagia with liquids, choking, pill dysphagia, globus sensation, anxiety.  GERD with possible LPR.  Continue omeprazole 40 mg twice daily.  Follow antireflux measures.  Schedule MBSS, barium esophagram and EGD. Adjust diet to avoid beverages, foods that are difficult to swallow. The risks (including bleeding, perforation, infection, missed lesions, medication reactions and possible hospitalization or  surgery if complications occur), benefits, and alternatives to endoscopy with possible biopsy and possible dilation were discussed with the patient and they consent to proceed.

## 2020-03-27 ENCOUNTER — Ambulatory Visit (HOSPITAL_COMMUNITY)
Admission: RE | Admit: 2020-03-27 | Discharge: 2020-03-27 | Disposition: A | Discharge status: Home / Self Care | Payer: Medicare Other | Source: Ambulatory Visit | Attending: Gastroenterology | Admitting: Gastroenterology

## 2020-03-27 ENCOUNTER — Other Ambulatory Visit: Payer: Self-pay

## 2020-03-27 DIAGNOSIS — R1312 Dysphagia, oropharyngeal phase: Secondary | ICD-10-CM | POA: Insufficient documentation

## 2020-03-27 DIAGNOSIS — R1310 Dysphagia, unspecified: Secondary | ICD-10-CM | POA: Diagnosis not present

## 2020-03-27 DIAGNOSIS — R059 Cough, unspecified: Secondary | ICD-10-CM

## 2020-03-27 DIAGNOSIS — R05 Cough: Secondary | ICD-10-CM | POA: Diagnosis not present

## 2020-03-27 DIAGNOSIS — K219 Gastro-esophageal reflux disease without esophagitis: Secondary | ICD-10-CM | POA: Diagnosis not present

## 2020-03-27 DIAGNOSIS — K224 Dyskinesia of esophagus: Secondary | ICD-10-CM | POA: Diagnosis not present

## 2020-03-27 DIAGNOSIS — F458 Other somatoform disorders: Secondary | ICD-10-CM | POA: Diagnosis not present

## 2020-03-27 NOTE — Therapy (Signed)
Modified Barium Swallow Progress Note  Patient Details  Name: [name] MRN: [id number] Date of Birth: Nov 29, 1934  Today's Date: 03/27/2020  Modified Barium Swallow completed.  Full report located under Chart Review in the Imaging Section.  Brief recommendations include the following:  Clinical Impression Pt was seen for outpatient MBS due to concerns for aspiration. Pt reports intermittent difficulty with liquids and large pills. She reports difficulty initiating the swallow reflex, and indicates globus sensation after the swallow. Of note, pt had C5-6 fusion many years ago, which may contribute to globus sensation. Pt accepted trials of thin liquid, nectar thick liquid, puree, solid, and barium tablet. Oral phase was characterized by timely bolus prep and propulsion on small boluses. Piecemeal swallow was noted with large and consecutive boluses. Trace lingual residue noted after large boluses of puree and solid textures. Premature spillage over the tongue base was noted across consistencies.  Pharyngeal swallow was characterized by trigger of the swallow reflex at the level of the vallecula on nectar thick, puree, solid and barium tablet. Thin liquids triggered at the pyriform sinus. Pt appeared to struggle to clear large and/or consecutive boluses, however, no penetration or aspiration was seen on any texture. Barium tablet was given with water, and was noted to pause in the vallecular sinus until a second bolus of water was taken. Barium tablet then cleared the pharynx. Esophageal sweep revealed it to be clear.   Pt went for esophagram following MBS. Following both studies, pt was provided with education regarding results, and was provided with written information regarding safe swallow precautions and management of esophageal dysmotility (given likelihood of presbyesophagus). No further ST intervention is recommended at this time. Thank you for this referral.   Swallow Evaluation  Recommendations  SLP Diet Recommendations: Dysphagia 3 (Mech soft) solids;Thin liquid   Liquid Administration via: Cup;Straw   Medication Administration: Whole meds with liquid   Supervision: Patient able to self feed   Compensations: Minimize environmental distractions;Slow rate;Small sips/bites;Follow solids with liquid   Postural Changes: Remain semi-upright after after feeds/meals (Comment);Seated upright at 90 degrees   Oral Care Recommendations: Oral care BID   Behavioral and Dietary Strategies for Management of Esophageal Dysmotility 1. Take reflux medications 30+ minutes before other po intake, in the morning 2. Begin meals with warm beverage 3. Eat smaller more frequent meals 4. Eat slowly, taking small bites and sips 5. Alternate solids and liquids 6. Avoid foods/liquids that increase acid production 7. Sit upright during and for 30+ minutes after meals to facilitate esophageal clearing  Birdella Sippel B. Quentin Ore, [facility], [place] Speech Language Pathologist Office: [phone]   Shonna Chock 03/27/2020,12:16 PM

## 2020-03-29 NOTE — Progress Notes (Signed)
Subjective:    Patient ID: [name], female    DOB: 18-Oct-1934, 85 y.o.   MRN: [id number]  HPI The patient is here for follow up of their chronic medical problems, including htn, SOB with exertion, anxiety.   She was here one month ago.  We started her on lexapro for her anxiety. She had shaking just after starting the medication and stopped it.  She had also started the cholesterol medication because she which was causing side effects   She still feels shaky in the morning - inside her body and outside - it gets better during the day.  She feels the anxiety might be ok.    She did see cardiology for DOE.    She saw GI for her dysphagia and GERD.  EGD is planned with possible dilation if needed.   She is having left arm and leg have been swollen.  It started 1-2 months ago.  She denies pain in her left arm.  She has good ROM in the arm.  She denies any injury to the arm.  At one point she thought the left leg was swollen as well, but today she does not feel it is swollen.    Medications and allergies reviewed with patient and updated if appropriate. Htn,   Patient Active Problem List   Diagnosis Date Noted  . Aortic atherosclerosis ([place]) 03/30/2020  . Chronic thoracic back pain 02/03/2020  . Iron deficiency anemia 02/02/2020  . Near syncope 09/27/2019  . Hypertension 09/20/2019  . Acute pain of right knee 08/03/2019  . (HFpEF) heart failure with preserved ejection fraction ([place]) 08/02/2019  . Pacemaker 07/19/2019  . CKD (chronic kidney disease) 01/30/2019  . Lumbar spinal stenosis 01/10/2018  . Osteoarthritis of both knees 12/15/2017  . Epigastric pain 09/09/2017  . Neuralgia 09/09/2017  . Claudication of calf muscles ([place]) 11/22/2016  . DOE (dyspnea on exertion) 11/22/2016  . Hyperreflexia of lower extremity 07/16/2016  . Bilateral leg paresthesia 07/16/2016  . Carotid arterial disease ([place]) 07/13/2016  . Poor balance 06/23/2016  . Weakness of both lower  extremities 06/23/2016  . Sinus node dysfunction ([place]) 05/05/2016  . Right shoulder pain 03/14/2016  . Multinodular goiter (nontoxic) 02/21/2016  . Neck pain 02/15/2016  . Prediabetes 12/17/2015  . Dysphagia 12/17/2015  . T8 vertebral fracture ([place]) 04/09/2015  . Overweight 10/09/2011  . Allergic rhinitis, cause unspecified   . GERD (gastroesophageal reflux disease) 02/05/2011  . Vitamin B 12 deficiency 04/17/2009  . Hyperlipidemia 04/17/2009  . Osteoporosis 04/17/2009  . ADENOCARCINOMA, BREAST, HX OF 04/17/2009  . CONSTIPATION 03/08/2009  . COLONIC POLYPS, HYPERPLASTIC, HX OF 03/06/2009    Current Outpatient Medications on File Prior to Visit  Medication Sig Dispense Refill  . acetaminophen (TYLENOL) 500 MG tablet Take 500 mg by mouth 2 (two) times daily.    . cholecalciferol (VITAMIN D) 1000 UNITS tablet Take 2,000 Units by mouth daily.     . Cyanocobalamin (RA VITAMIN B-12 TR) 1000 MCG TBCR Take 1,000 mcg by mouth daily.     . furosemide (LASIX) 40 MG tablet TAKE 1 TABLET ONCE DAILY. 90 tablet 3  . loratadine (CLARITIN) 10 MG tablet Take 10 mg by mouth daily as needed for allergies.    . metoprolol succinate (TOPROL XL) 25 MG 24 hr tablet Take 1 tablet (25 mg total) by mouth daily. 90 tablet 3  . omeprazole (PRILOSEC) 40 MG capsule TAKE (1) CAPSULE TWICE DAILY. 60 capsule 11  . Polyethyl Glycol-Propyl  Glycol (LUBRICANT EYE DROPS) 0.4-0.3 % SOLN Place 1 drop into both eyes 3 (three) times daily as needed (dry/irritated eyes.).    [name] polyethylene glycol (MIRALAX / GLYCOLAX) packet Take 17 g by mouth daily as needed (constipation).     . potassium chloride SA (KLOR-CON) 20 MEQ tablet TAKE 1 TABLET ONCE DAILY. 90 tablet 3  . sodium chloride (OCEAN) 0.65 % SOLN nasal spray Place 1 spray into both nostrils 4 (four) times daily as needed for congestion.     No current facility-administered medications on file prior to visit.    Past Medical History:  Diagnosis Date  . Cancer of left  breast ([place]) 1978  . Chronic thoracic back pain    "T8; fracture; 03/2015; no OR" (05/05/2016)  . GERD (gastroesophageal reflux disease)   . Heart murmur   . History of hiatal hernia   . Hyperlipidemia   . Hyperplastic colon polyp   . IBS (irritable bowel syndrome)   . Multiple thyroid nodules   . Osteoporosis    T8 compression fx 03/2015   . Personal history of radiation therapy   . Presence of permanent cardiac pacemaker   . Vitamin B12 deficiency     Past Surgical History:  Procedure Laterality Date  . ANTERIOR CERVICAL DECOMP/DISCECTOMY FUSION  2008   C5-6  . AUGMENTATION MAMMAPLASTY    . BACK SURGERY    . BREAST SURGERY    . CARDIAC CATHETERIZATION  2001  . EP IMPLANTABLE DEVICE N/A 05/05/2016   Procedure: Pacemaker Implant;  Surgeon: Evans Lance, MD;  Location: [facility];  Service: Cardiovascular;  Laterality: N/A;  . EXCISIONAL HEMORRHOIDECTOMY  1984   With subsequent correction of surgery  . INSERT / REPLACE / REMOVE PACEMAKER    . INTRAVASCULAR PRESSURE WIRE/FFR STUDY N/A 10/07/2019   Procedure: INTRAVASCULAR PRESSURE WIRE/FFR STUDY;  Surgeon: Jettie Booze, MD;  Location: [facility];  Service: Cardiovascular;  Laterality: N/A;  . KNEE ARTHROSCOPY Left    "meniscus tear"  . LEFT HEART CATH AND CORONARY ANGIOGRAPHY N/A 10/07/2019   Procedure: LEFT HEART CATH AND CORONARY ANGIOGRAPHY;  Surgeon: Jettie Booze, MD;  Location: [facility];  Service: Cardiovascular;  Laterality: N/A;  . MASTECTOMY Left 1978  . PLACEMENT OF BREAST IMPLANTS Left 1981  . REDUCTION MAMMAPLASTY Right   . SHOULDER ARTHROSCOPY W/ ROTATOR CUFF REPAIR Left 02/2006   "tear"  . TUBAL LIGATION      Social History   Socioeconomic History  . Marital status: Married    Spouse name: Not on file  . Number of children: 2  . Years of education: Not on file  . Highest education level: Not on file  Occupational History  . Occupation: retired  Tobacco Use  .  Smoking status: Never Smoker  . Smokeless tobacco: Never Used  Vaping Use  . Vaping Use: Never used  Substance and Sexual Activity  . Alcohol use: No    Alcohol/week: 0.0 standard drinks  . Drug use: No  . Sexual activity: Not Currently  Other Topics Concern  . Not on file  Social History Narrative   Housewife, Lives with spouse, 2 children   Social Determinants of Health   Financial Resource Strain:   . Difficulty of Paying Living Expenses: Not on file  Food Insecurity:   . Worried About Charity fundraiser in the Last Year: Not on file  . Ran Out of Food in the Last Year: Not on file  Transportation Needs:   . Film/video editor (Medical): Not on file  . Lack of Transportation (Non-Medical): Not on file  Physical Activity:   . Days of Exercise per Week: Not on file  . Minutes of Exercise per Session: Not on file  Stress:   . Feeling of Stress : Not on file  Social Connections:   . Frequency of Communication with Friends and Family: Not on file  . Frequency of Social Gatherings with Friends and Family: Not on file  . Attends Religious Services: Not on file  . Active Member of Clubs or Organizations: Not on file  . Attends Archivist Meetings: Not on file  . Marital Status: Not on file    Family History  Problem Relation Age of Onset  . Diabetes Sister   . Diabetes Brother   . Breast cancer Maternal Aunt   . Colon cancer Neg Hx     Review of Systems  Constitutional: Negative for fever.  Respiratory: Positive for cough and shortness of breath (w exertion). Negative for wheezing.   Cardiovascular: Negative for chest pain, palpitations and leg swelling.  Neurological: Negative for light-headedness and headaches.       Objective:   Vitals:   03/30/20 0920  BP: (!) 144/62  Pulse: 61  Temp: 98 F (36.7 C)  SpO2: 98%   BP Readings from Last 3 Encounters:  03/30/20 (!) 144/62  03/20/20 (!) 116/50  02/14/20 (!) 140/50   Wt Readings from Last 3  Encounters:  03/30/20 159 lb (72.1 kg)  03/20/20 162 lb (73.5 kg)  02/14/20 161 lb 12.8 oz (73.4 kg)   Body mass index is 30.04 kg/m.   Physical Exam    Constitutional: Appears well-developed and well-nourished. No distress.  HENT:  Head: Normocephalic and atraumatic.  Neck: Neck supple. No tracheal deviation present. No thyromegaly present.  No cervical lymphadenopathy Cardiovascular: Normal rate, regular rhythm and normal heart sounds.   No murmur heard. No carotid bruit .  No edema Pulmonary/Chest: Effort normal and breath sounds normal. No respiratory distress. No has no wheezes. No rales.  Skin: Skin is warm and dry. Not diaphoretic.  Left arm medial aspect there is swelling and very mild erythema and mild swelling from wrist all the way up the arm.  No tenderness Psychiatric: Normal mood and affect. Behavior is normal.      Assessment & Plan:    See Problem List for Assessment and Plan of chronic medical problems.    This visit occurred during the SARS-CoV-2 public health emergency.  Safety protocols were in place, including screening questions prior to the visit, additional usage of staff PPE, and extensive cleaning of exam room while observing appropriate contact time as indicated for disinfecting solutions.

## 2020-03-30 ENCOUNTER — Ambulatory Visit (INDEPENDENT_AMBULATORY_CARE_PROVIDER_SITE_OTHER): Payer: Medicare Other | Admitting: Internal Medicine

## 2020-03-30 ENCOUNTER — Encounter: Payer: Self-pay | Admitting: Internal Medicine

## 2020-03-30 ENCOUNTER — Other Ambulatory Visit: Payer: Self-pay | Admitting: Gastroenterology

## 2020-03-30 ENCOUNTER — Other Ambulatory Visit: Payer: Self-pay

## 2020-03-30 VITALS — BP 144/62 | HR 61 | Temp 98.0°F | Ht 61.0 in | Wt 159.0 lb

## 2020-03-30 DIAGNOSIS — I10 Essential (primary) hypertension: Secondary | ICD-10-CM | POA: Diagnosis not present

## 2020-03-30 DIAGNOSIS — E782 Mixed hyperlipidemia: Secondary | ICD-10-CM

## 2020-03-30 DIAGNOSIS — I700 Atherosclerosis of aorta: Secondary | ICD-10-CM | POA: Insufficient documentation

## 2020-03-30 DIAGNOSIS — L03114 Cellulitis of left upper limb: Secondary | ICD-10-CM | POA: Diagnosis not present

## 2020-03-30 MED ORDER — CEPHALEXIN 500 MG PO CAPS: 500.0000 mg | ORAL_CAPSULE | Freq: Three times a day (TID) | ORAL | 0 refills | Status: DC

## 2020-03-30 MED ORDER — EZETIMIBE 10 MG PO TABS
10.0000 mg | ORAL_TABLET | Freq: Every day | ORAL | 3 refills | Status: DC
Start: 2020-03-30 — End: 2021-04-04

## 2020-03-30 NOTE — Assessment & Plan Note (Addendum)
chronic Did not tolerate statins and does not want to retry Was doing ok with zetia, but stopped it after side effects - unlikely related to zetia  Restart zetia She is limited in her exercise, but encouraged regular exercise

## 2020-03-30 NOTE — Assessment & Plan Note (Signed)
Chronic Was on Zetia, but stopped that because she was concerned about possible side effects-experiencing shaking Restart Zetia

## 2020-03-30 NOTE — Assessment & Plan Note (Signed)
Acute The medial aspect of left arm has swelling, erythema ?  Cause We will treat for cellulitis-Keflex 3 times daily  x10 days CBC, ESR today Follow-up after completing the antibiotics to ensure resolution

## 2020-03-30 NOTE — Patient Instructions (Addendum)
  Blood work was ordered.     Medications reviewed and updated.  Changes include :   Restart zetia,  Start keflex for your arm.  Your prescription(s) have been submitted to your pharmacy. Please take as directed and contact our office if you believe you are having problem(s) with the medication(s).     Please followup in 6 months

## 2020-03-30 NOTE — Assessment & Plan Note (Addendum)
Chronic BP well controlled Current regimen effective and well tolerated Continue current medications at current doses CMP CMP

## 2020-03-31 LAB — COMPLETE METABOLIC PANEL WITH GFR
AG Ratio: 1.8 (calc) (ref 1.0–2.5)
ALT: 8 U/L (ref 6–29)
AST: 11 U/L (ref 10–35)
Albumin: 4.4 g/dL (ref 3.6–5.1)
Alkaline phosphatase (APISO): 40 U/L (ref 37–153)
BUN/Creatinine Ratio: 18 (calc) (ref 6–22)
BUN: 22 mg/dL (ref 7–25)
CO2: 25 mmol/L (ref 20–32)
Calcium: 9.4 mg/dL (ref 8.6–10.4)
Chloride: 103 mmol/L (ref 98–110)
Creat: 1.23 mg/dL — ABNORMAL HIGH (ref 0.60–0.88)
GFR, Est African American: 47 mL/min/{1.73_m2} — ABNORMAL LOW (ref >=60)
GFR, Est Non African American: 41 mL/min/{1.73_m2} — ABNORMAL LOW (ref >=60)
Globulin: 2.4 g/dL (calc) (ref 1.9–3.7)
Glucose, Bld: 107 mg/dL — ABNORMAL HIGH (ref 65–99)
Potassium: 4.9 mmol/L (ref 3.5–5.3)
Sodium: 137 mmol/L (ref 135–146)
Total Bilirubin: 0.4 mg/dL (ref 0.2–1.2)
Total Protein: 6.8 g/dL (ref 6.1–8.1)

## 2020-03-31 LAB — CBC WITH DIFFERENTIAL/PLATELET
Absolute Monocytes: 881 cells/uL (ref 200–950)
Basophils Absolute: 47 cells/uL (ref 0–200)
Basophils Relative: 0.6 %
Eosinophils Absolute: 218 cells/uL (ref 15–500)
Eosinophils Relative: 2.8 %
HCT: 36.3 % (ref 35.0–45.0)
Hemoglobin: 12 g/dL (ref 11.7–15.5)
Lymphs Abs: 1396 cells/uL (ref 850–3900)
MCH: 32.4 pg (ref 27.0–33.0)
MCHC: 33.1 g/dL (ref 32.0–36.0)
MCV: 98.1 fL (ref 80.0–100.0)
MPV: 11.1 fL (ref 7.5–12.5)
Monocytes Relative: 11.3 %
Neutro Abs: 5257 cells/uL (ref 1500–7800)
Neutrophils Relative %: 67.4 %
Platelets: 333 10*3/uL (ref 140–400)
RBC: 3.7 10*6/uL — ABNORMAL LOW (ref 3.80–5.10)
RDW: 12 % (ref 11.0–15.0)
Total Lymphocyte: 17.9 %
WBC: 7.8 10*3/uL (ref 3.8–10.8)

## 2020-03-31 LAB — SEDIMENTATION RATE: Sed Rate: 6 mm/h (ref 0–30)

## 2020-04-09 NOTE — Progress Notes (Signed)
Subjective:    Patient ID: [name], female    DOB: 1935-01-16, 85 y.o.   MRN: [id number]  HPI She is here for follow up of her cellulitis on her left arm.  She completed the antibiotic today.    She completed the antibiotic today-it was her last dose this morning.  She has not noticed much change in arm.  She denies any fevers or chills.  She denies any pain in the arm.  Sometimes the arm feels more swollen than others.  She continues to have chronic shortness of breath.  Her cardiologist did advise this was related to her heart.  She has grade 2 diastolic dysfunction.  She is not exercising much and he did advise her to increase her exercise.  Medications and allergies reviewed with patient and updated if appropriate.  Patient Active Problem List   Diagnosis Date Noted  . Aortic atherosclerosis ([place]) 03/30/2020  . Cellulitis of arm, left 03/30/2020  . Chronic thoracic back pain 02/03/2020  . Iron deficiency anemia 02/02/2020  . Near syncope 09/27/2019  . Hypertension 09/20/2019  . Acute pain of right knee 08/03/2019  . (HFpEF) heart failure with preserved ejection fraction ([place]) 08/02/2019  . Pacemaker 07/19/2019  . CKD (chronic kidney disease) 01/30/2019  . Lumbar spinal stenosis 01/10/2018  . Osteoarthritis of both knees 12/15/2017  . Epigastric pain 09/09/2017  . Neuralgia 09/09/2017  . Claudication of calf muscles ([place]) 11/22/2016  . DOE (dyspnea on exertion) 11/22/2016  . Hyperreflexia of lower extremity 07/16/2016  . Bilateral leg paresthesia 07/16/2016  . Carotid arterial disease ([place]) 07/13/2016  . Poor balance 06/23/2016  . Weakness of both lower extremities 06/23/2016  . Sinus node dysfunction ([place]) 05/05/2016  . Right shoulder pain 03/14/2016  . Multinodular goiter (nontoxic) 02/21/2016  . Neck pain 02/15/2016  . Prediabetes 12/17/2015  . Dysphagia 12/17/2015  . T8 vertebral fracture ([place]) 04/09/2015  . Overweight 10/09/2011  . Allergic rhinitis,  cause unspecified   . GERD (gastroesophageal reflux disease) 02/05/2011  . Vitamin B 12 deficiency 04/17/2009  . Hyperlipidemia 04/17/2009  . Osteoporosis 04/17/2009  . ADENOCARCINOMA, BREAST, HX OF 04/17/2009  . CONSTIPATION 03/08/2009  . COLONIC POLYPS, HYPERPLASTIC, HX OF 03/06/2009    Current Outpatient Medications on File Prior to Visit  Medication Sig Dispense Refill  . acetaminophen (TYLENOL) 500 MG tablet Take 500 mg by mouth 2 (two) times daily.    . cholecalciferol (VITAMIN D) 1000 UNITS tablet Take 2,000 Units by mouth daily.     . Cyanocobalamin (RA VITAMIN B-12 TR) 1000 MCG TBCR Take 1,000 mcg by mouth daily.     [name] ezetimibe (ZETIA) 10 MG tablet Take 1 tablet (10 mg total) by mouth daily. 90 tablet 3  . furosemide (LASIX) 40 MG tablet TAKE 1 TABLET ONCE DAILY. 90 tablet 3  . loratadine (CLARITIN) 10 MG tablet Take 10 mg by mouth daily as needed for allergies.    . metoprolol succinate (TOPROL XL) 25 MG 24 hr tablet Take 1 tablet (25 mg total) by mouth daily. 90 tablet 3  . omeprazole (PRILOSEC) 40 MG capsule TAKE ONE CAPSULE BY MOUTH TWICE A DAY 60 capsule 11  . Polyethyl Glycol-Propyl Glycol (LUBRICANT EYE DROPS) 0.4-0.3 % SOLN Place 1 drop into both eyes 3 (three) times daily as needed (dry/irritated eyes.).    [name] polyethylene glycol (MIRALAX / GLYCOLAX) packet Take 17 g by mouth daily as needed (constipation).     . potassium chloride SA (KLOR-CON) 20 MEQ  tablet TAKE 1 TABLET ONCE DAILY. 90 tablet 3  . sodium chloride (OCEAN) 0.65 % SOLN nasal spray Place 1 spray into both nostrils 4 (four) times daily as needed for congestion.     No current facility-administered medications on file prior to visit.    Past Medical History:  Diagnosis Date  . Cancer of left breast ([place]) 1978  . Chronic thoracic back pain    "T8; fracture; 03/2015; no OR" (05/05/2016)  . GERD (gastroesophageal reflux disease)   . Heart murmur   . History of hiatal hernia   . Hyperlipidemia   .  Hyperplastic colon polyp   . IBS (irritable bowel syndrome)   . Multiple thyroid nodules   . Osteoporosis    T8 compression fx 03/2015   . Personal history of radiation therapy   . Presence of permanent cardiac pacemaker   . Vitamin B12 deficiency     Past Surgical History:  Procedure Laterality Date  . ANTERIOR CERVICAL DECOMP/DISCECTOMY FUSION  2008   C5-6  . AUGMENTATION MAMMAPLASTY    . BACK SURGERY    . BREAST SURGERY    . CARDIAC CATHETERIZATION  2001  . EP IMPLANTABLE DEVICE N/A 05/05/2016   Procedure: Pacemaker Implant;  Surgeon: Evans Lance, MD;  Location: [facility];  Service: Cardiovascular;  Laterality: N/A;  . EXCISIONAL HEMORRHOIDECTOMY  1984   With subsequent correction of surgery  . INSERT / REPLACE / REMOVE PACEMAKER    . INTRAVASCULAR PRESSURE WIRE/FFR STUDY N/A 10/07/2019   Procedure: INTRAVASCULAR PRESSURE WIRE/FFR STUDY;  Surgeon: Jettie Booze, MD;  Location: [facility];  Service: Cardiovascular;  Laterality: N/A;  . KNEE ARTHROSCOPY Left    "meniscus tear"  . LEFT HEART CATH AND CORONARY ANGIOGRAPHY N/A 10/07/2019   Procedure: LEFT HEART CATH AND CORONARY ANGIOGRAPHY;  Surgeon: Jettie Booze, MD;  Location: [facility];  Service: Cardiovascular;  Laterality: N/A;  . MASTECTOMY Left 1978  . PLACEMENT OF BREAST IMPLANTS Left 1981  . REDUCTION MAMMAPLASTY Right   . SHOULDER ARTHROSCOPY W/ ROTATOR CUFF REPAIR Left 02/2006   "tear"  . TUBAL LIGATION      Social History   Socioeconomic History  . Marital status: Married    Spouse name: Not on file  . Number of children: 2  . Years of education: Not on file  . Highest education level: Not on file  Occupational History  . Occupation: retired  Tobacco Use  . Smoking status: Never Smoker  . Smokeless tobacco: Never Used  Vaping Use  . Vaping Use: Never used  Substance and Sexual Activity  . Alcohol use: No    Alcohol/week: 0.0 standard drinks  . Drug use: No  .  Sexual activity: Not Currently  Other Topics Concern  . Not on file  Social History Narrative   Housewife, Lives with spouse, 2 children   Social Determinants of Health   Financial Resource Strain:   . Difficulty of Paying Living Expenses: Not on file  Food Insecurity:   . Worried About Charity fundraiser in the Last Year: Not on file  . Ran Out of Food in the Last Year: Not on file  Transportation Needs:   . Lack of Transportation (Medical): Not on file  . Lack of Transportation (Non-Medical): Not on file  Physical Activity:   . Days of Exercise per Week: Not on file  . Minutes of Exercise per Session: Not on file  Stress:   .  Feeling of Stress : Not on file  Social Connections:   . Frequency of Communication with Friends and Family: Not on file  . Frequency of Social Gatherings with Friends and Family: Not on file  . Attends Religious Services: Not on file  . Active Member of Clubs or Organizations: Not on file  . Attends Archivist Meetings: Not on file  . Marital Status: Not on file    Family History  Problem Relation Age of Onset  . Diabetes Sister   . Diabetes Brother   . Breast cancer Maternal Aunt   . Colon cancer Neg Hx     Review of Systems  Constitutional: Negative for chills and fever.  Respiratory: Positive for cough and shortness of breath (with walking quick). Negative for wheezing.   Cardiovascular: Negative for chest pain and palpitations.  Neurological: Negative for light-headedness and headaches.       Objective:   Vitals:   04/10/20 1057  BP: (!) 148/70  Pulse: 60  Temp: 98.1 F (36.7 C)  SpO2: 97%   BP Readings from Last 3 Encounters:  04/10/20 (!) 148/70  03/30/20 (!) 144/62  03/20/20 (!) 116/50   Wt Readings from Last 3 Encounters:  04/10/20 162 lb 3.2 oz (73.6 kg)  03/30/20 159 lb (72.1 kg)  03/20/20 162 lb (73.5 kg)   Body mass index is 30.65 kg/m.   Physical Exam Constitutional:      General: She is not in  acute distress.    Appearance: Normal appearance. She is not ill-appearing, toxic-appearing or diaphoretic.  Skin:    General: Skin is warm and dry.     Comments: Linear patch of very mild erythema and very mild swelling from the left medial wrist up toward axilla.  No tenderness with palpation.  No fluctuance.  No breaks in skin.  Neurological:     Mental Status: She is alert.            Assessment & Plan:    See Problem List for Assessment and Plan of chronic medical problems.    This visit occurred during the SARS-CoV-2 public health emergency.  Safety protocols were in place, including screening questions prior to the visit, additional usage of staff PPE, and extensive cleaning of exam room while observing appropriate contact time as indicated for disinfecting solutions.

## 2020-04-10 ENCOUNTER — Telehealth: Payer: Self-pay | Admitting: Gastroenterology

## 2020-04-10 ENCOUNTER — Other Ambulatory Visit: Payer: Self-pay

## 2020-04-10 ENCOUNTER — Ambulatory Visit (INDEPENDENT_AMBULATORY_CARE_PROVIDER_SITE_OTHER): Payer: Medicare Other | Admitting: Internal Medicine

## 2020-04-10 ENCOUNTER — Encounter: Payer: Self-pay | Admitting: Internal Medicine

## 2020-04-10 VITALS — BP 148/70 | HR 60 | Temp 98.1°F | Wt 162.2 lb

## 2020-04-10 DIAGNOSIS — R0600 Dyspnea, unspecified: Secondary | ICD-10-CM | POA: Diagnosis not present

## 2020-04-10 DIAGNOSIS — M7989 Other specified soft tissue disorders: Secondary | ICD-10-CM

## 2020-04-10 DIAGNOSIS — R0609 Other forms of dyspnea: Secondary | ICD-10-CM

## 2020-04-10 NOTE — Assessment & Plan Note (Signed)
Subacute Linear mild swelling and mild erythema from left medial wrist up toward axilla ?  Lymphangitis, cellulitis No improvement with cephalexin We will obtain venous ultrasound and determine treatment after that

## 2020-04-10 NOTE — Telephone Encounter (Signed)
Patient reports this is not a new diagnosis.  She has DOE.  She is asking if you will allow her to proceed with EGD.

## 2020-04-10 NOTE — Assessment & Plan Note (Signed)
Chronic Has seen pulmonary and this is not pulmonary in origin Likely cardiac She does have grade 2 diastolic dysfunction Follows with Dr. Lovena Le and he stressed that she needs to increase her exercise Advised that she start with 10 minutes daily and slowly increase to a goal of 30 minutes daily

## 2020-04-10 NOTE — Patient Instructions (Addendum)
Ultrasound of your left arm was ordered.      Work on increasing your walking.  Work your way up to walking 30 minutes 4-5 times a week.

## 2020-04-10 NOTE — Telephone Encounter (Signed)
Patient called stated is scheduled for EGD on 04/17/20 and she would like to discuss her having shortness of breath and sees a cardiologist for it. Wants to make sure that is still ok for her to proceed.

## 2020-04-10 NOTE — Telephone Encounter (Signed)
OK to proceed with EGD as scheduled. She was evaluated by Cardiology on 02/14/2020 and Dr. Lovena Le felt her chronic diastolic heart failure was stable and no additional evaluation was planned. Cardiac cath in 09/2019 had EF of 55-65%.

## 2020-04-11 ENCOUNTER — Ambulatory Visit (HOSPITAL_COMMUNITY)
Admission: RE | Admit: 2020-04-11 | Discharge: 2020-04-11 | Disposition: A | Discharge status: Home / Self Care | Payer: Medicare Other | Source: Ambulatory Visit | Attending: Internal Medicine | Admitting: Internal Medicine

## 2020-04-11 DIAGNOSIS — M7989 Other specified soft tissue disorders: Secondary | ICD-10-CM | POA: Diagnosis not present

## 2020-04-11 NOTE — Telephone Encounter (Signed)
Patient notified

## 2020-04-13 ENCOUNTER — Other Ambulatory Visit: Payer: Self-pay | Admitting: Internal Medicine

## 2020-04-13 NOTE — Telephone Encounter (Signed)
Patient is wondering if there is any additional follow-up needed after her ultrasound (another appointment)  Requesting a call

## 2020-04-15 NOTE — Telephone Encounter (Signed)
The ultrasound of you arm showed no blood clot.    I do think this is a mild infection.  I would like to try another antibiotic.  After you finish this please call and let me know if there is any improvement or no change.  The next step will be to have you see a dermatologist for further evaluation.     abx pending.

## 2020-04-16 MED ORDER — DOXYCYCLINE HYCLATE 100 MG PO TABS: 100.0000 mg | ORAL_TABLET | Freq: Two times a day (BID) | ORAL | 0 refills | Status: DC

## 2020-04-16 NOTE — Telephone Encounter (Signed)
Notified pt w/MD response. Pt agreed to take the Doxycycline sent rx to [email or web address] [name]

## 2020-04-17 ENCOUNTER — Ambulatory Visit (AMBULATORY_SURGERY_CENTER): Payer: Medicare Other | Admitting: Gastroenterology

## 2020-04-17 ENCOUNTER — Other Ambulatory Visit: Payer: Self-pay

## 2020-04-17 ENCOUNTER — Encounter: Payer: Self-pay | Admitting: Gastroenterology

## 2020-04-17 ENCOUNTER — Encounter: Payer: Medicare Other | Admitting: Gastroenterology

## 2020-04-17 VITALS — BP 159/60 | HR 60 | Temp 98.8°F | Resp 12 | Ht 61.0 in | Wt 162.0 lb

## 2020-04-17 DIAGNOSIS — R1310 Dysphagia, unspecified: Secondary | ICD-10-CM

## 2020-04-17 DIAGNOSIS — K449 Diaphragmatic hernia without obstruction or gangrene: Secondary | ICD-10-CM | POA: Diagnosis not present

## 2020-04-17 DIAGNOSIS — K219 Gastro-esophageal reflux disease without esophagitis: Secondary | ICD-10-CM

## 2020-04-17 DIAGNOSIS — K2970 Gastritis, unspecified, without bleeding: Secondary | ICD-10-CM

## 2020-04-17 DIAGNOSIS — K3189 Other diseases of stomach and duodenum: Secondary | ICD-10-CM | POA: Diagnosis not present

## 2020-04-17 DIAGNOSIS — K222 Esophageal obstruction: Secondary | ICD-10-CM

## 2020-04-17 MED ORDER — SODIUM CHLORIDE 0.9 % IV SOLN: 500.0000 mL | Freq: Once | INTRAVENOUS | Status: DC

## 2020-04-17 NOTE — Progress Notes (Signed)
VS by SH 

## 2020-04-17 NOTE — Op Note (Signed)
[facility] Patient Name: [name] Procedure Date: 04/17/2020 9:36 AM MRN: [id number] Endoscopist: Ladene Artist , MD Age: 85 Referring MD:  Date of Birth: [birth year] Gender: Female Account #: [id number] Procedure:                Upper GI endoscopy Indications:              Dysphagia, Gastroesophageal reflux disease Medicines:                Monitored Anesthesia Care Procedure:                Pre-Anesthesia Assessment:                           - Prior to the procedure, a History and Physical                            was performed, and patient medications and                            allergies were reviewed. The patient's tolerance of                            previous anesthesia was also reviewed. The risks                            and benefits of the procedure and the sedation                            options and risks were discussed with the patient.                            All questions were answered, and informed consent                            was obtained. Prior Anticoagulants: The patient has                            taken no previous anticoagulant or antiplatelet                            agents. ASA Grade Assessment: III - A patient with                            severe systemic disease. After reviewing the risks                            and benefits, the patient was deemed in                            satisfactory condition to undergo the procedure.                           After obtaining informed consent, the endoscope was  passed under direct vision. Throughout the                            procedure, the patient's blood pressure, pulse, and                            oxygen saturations were monitored continuously. The                            Endoscope was introduced through the mouth, and                            advanced to the second part of duodenum. The upper                            GI  endoscopy was accomplished without difficulty.                            The patient tolerated the procedure well. Scope In: Scope Out: Findings:                 One benign-appearing, intrinsic mild stenosis was                            found at the gastroesophageal junction. This                            stenosis measured 1.4 cm (inner diameter) x less                            than one cm (in length). The stenosis was                            traversed. A guidewire was placed and the scope was                            withdrawn. Dilations were performed with Savary                            dilators with mild resistance at 15 mm and 16 mm.                           The exam of the esophagus was otherwise normal.                           A small hiatal hernia was present.                           Patchy moderate inflammation characterized by                            erythema and granularity was found in the gastric  body, in the gastric antrum and in the prepyloric                            region of the stomach. Biopsies were taken with a                            cold forceps for histology.                           The exam of the stomach was otherwise normal.                           The duodenal bulb and second portion of the                            duodenum were normal. Complications:            No immediate complications. Estimated Blood Loss:     Estimated blood loss was minimal. Impression:               - Benign-appearing esophageal stenosis. Dilated.                           - Small hiatal hernia.                           - Gastritis. Biopsied.                           - Normal duodenal bulb and second portion of the                            duodenum. Recommendation:           - Patient has a contact number available for                            emergencies. The signs and symptoms of potential                             delayed complications were discussed with the                            patient. Return to normal activities tomorrow.                            Written discharge instructions were provided to the                            patient.                           - Clear liquid diet for 2 hours, then advance as                            tolerated to soft diet today.                           -  Resume prior diet tomorrow.                           - Continue present medications.                           - Await pathology results.                           - Return to GI office in 6 weeks. Ladene Artist, MD 04/17/2020 9:55:38 AM This report has been signed electronically.

## 2020-04-17 NOTE — Progress Notes (Signed)
PT taken to PACU. Monitors in place. VSS. Report given to RN. 

## 2020-04-17 NOTE — Patient Instructions (Signed)
HANDOUTS PROVIDED ON: POST DILATION DIET, GASTRITIS, & HIATAL HERNIA  The biopsies taken today have been sent for pathology.  The results can take 1-3 weeks to receive.    You may resume your previous medication schedule.  Follow the recommended Post Dilation Diet provided for today and resume regular diet tomorrow.  Thank you for allowing [place] to care for you today!!!   YOU HAD AN ENDOSCOPIC PROCEDURE TODAY AT [facility]:   Refer to the procedure report that was given to you for any specific questions about what was found during the examination.  If the procedure report does not answer your questions, please call your gastroenterologist to clarify.  If you requested that your care partner not be given the details of your procedure findings, then the procedure report has been included in a sealed envelope for you to review at your convenience later.  YOU SHOULD EXPECT: Some feelings of bloating in the abdomen. Passage of more gas than usual.  Walking can help get rid of the air that was put into your GI tract during the procedure and reduce the bloating.   Please Note:  You might notice some irritation and congestion in your nose or some drainage.  This is from the oxygen used during your procedure.  There is no need for concern and it should clear up in a day or so.  SYMPTOMS TO REPORT IMMEDIATELY:   Following upper endoscopy (EGD)  Vomiting of blood or coffee ground material  New chest pain or pain under the shoulder blades  Painful or persistently difficult swallowing  New shortness of breath  Fever of 100F or higher  Black, tarry-looking stools  For urgent or emergent issues, a gastroenterologist can be reached at any hour by calling [phone]. Do not use MyChart messaging for urgent concerns.    DIET:  We do recommend a post dilation diet today and tomorrow you should start with a small meal at first, but then you may proceed to your regular diet.  Drink  plenty of fluids but you should avoid alcoholic beverages for 24 hours.  ACTIVITY:  You should plan to take it easy for the rest of today and you should NOT DRIVE or use heavy machinery until tomorrow (because of the sedation medicines used during the test).    FOLLOW UP: Our staff will call the number listed on your records 48-72 hours following your procedure to check on you and address any questions or concerns that you may have regarding the information given to you following your procedure. If we do not reach you, we will leave a message.  We will attempt to reach you two times.  During this call, we will ask if you have developed any symptoms of COVID 19. If you develop any symptoms (ie: fever, flu-like symptoms, shortness of breath, cough etc.) before then, please call [phone].  If you test positive for Covid 19 in the 2 weeks post procedure, please call and report this information to [place].    If any biopsies were taken you will be contacted by phone or by letter within the next 1-3 weeks.  Please call us at [phone] if you have not heard about the biopsies in 3 weeks.    SIGNATURES/CONFIDENTIALITY: You and/or your care partner have signed paperwork which will be entered into your electronic medical record.  These signatures attest to the fact that that the information above on your After Visit Summary has been reviewed and is  understood.  Full responsibility of the confidentiality of this discharge information lies with you and/or your care-partner. 

## 2020-04-19 ENCOUNTER — Telehealth: Payer: Self-pay

## 2020-04-19 ENCOUNTER — Telehealth: Payer: Self-pay | Admitting: Internal Medicine

## 2020-04-19 MED ORDER — POTASSIUM CHLORIDE ER 10 MEQ PO TBCR: 20.0000 meq | EXTENDED_RELEASE_TABLET | Freq: Every day | ORAL | 3 refills | Status: DC

## 2020-04-19 NOTE — Telephone Encounter (Signed)
Pt c/o medication issue:  1. Name of Medication: POTASIUM pill  2. How are you currently taking this medication (dosage and times per day)? 1 BY MOUTH  3. Are you having a reaction (difficulty breathing--STAT)?   4. What is your medication issue? TO BIG to swallow

## 2020-04-19 NOTE — Telephone Encounter (Signed)
Returned call to Pt.  Will order potassium 10 meq as Pt is having trouble swallowing 20 meq tabs.  Pt aware.

## 2020-04-19 NOTE — Telephone Encounter (Signed)
  Follow up Call-  Call back number 04/17/2020  Post procedure Call Back phone  # [phone]  Permission to leave phone message Yes  Some recent data might be hidden     Patient questions:  Do you have a fever, pain , or abdominal swelling? No. Pain Score  0 *  Have you tolerated food without any problems? Yes.    Have you been able to return to your normal activities? Yes.    Do you have any questions about your discharge instructions: Diet   No. Medications  No. Follow up visit  No.  Do you have questions or concerns about your Care? No.  Actions: * If pain score is 4 or above: No action needed, pain <4.  1. Have you developed a fever since your procedure? no  2.   Have you had an respiratory symptoms (SOB or cough) since your procedure? no  3.   Have you tested positive for COVID 19 since your procedure no  4.   Have you had any family members/close contacts diagnosed with the COVID 19 since your procedure?  no   If yes to any of these questions please route to Joylene John, RN and Joella Prince, RN

## 2020-04-23 DIAGNOSIS — H04123 Dry eye syndrome of bilateral lacrimal glands: Secondary | ICD-10-CM | POA: Diagnosis not present

## 2020-04-23 DIAGNOSIS — H25813 Combined forms of age-related cataract, bilateral: Secondary | ICD-10-CM | POA: Diagnosis not present

## 2020-04-23 DIAGNOSIS — H35373 Puckering of macula, bilateral: Secondary | ICD-10-CM | POA: Diagnosis not present

## 2020-04-23 DIAGNOSIS — H0100A Unspecified blepharitis right eye, upper and lower eyelids: Secondary | ICD-10-CM | POA: Diagnosis not present

## 2020-04-23 DIAGNOSIS — H524 Presbyopia: Secondary | ICD-10-CM | POA: Diagnosis not present

## 2020-04-23 DIAGNOSIS — G43809 Other migraine, not intractable, without status migrainosus: Secondary | ICD-10-CM | POA: Diagnosis not present

## 2020-04-26 ENCOUNTER — Encounter: Payer: Self-pay | Admitting: Gastroenterology

## 2020-04-30 ENCOUNTER — Telehealth: Payer: Self-pay | Admitting: *Deleted

## 2020-04-30 DIAGNOSIS — H2511 Age-related nuclear cataract, right eye: Secondary | ICD-10-CM | POA: Diagnosis not present

## 2020-04-30 DIAGNOSIS — H2513 Age-related nuclear cataract, bilateral: Secondary | ICD-10-CM | POA: Diagnosis not present

## 2020-04-30 NOTE — Telephone Encounter (Signed)
° °  [facility] Pre-operative Risk Assessment    HEARTCARE STAFF: - Please ensure there is not already an duplicate clearance open for this procedure. - Under Visit Info/Reason for Call, type in Other and utilize the format Clearance MM/DD/YY or Clearance TBD. Do not use dashes or single digits. - If request is for dental extraction, please clarify the # of teeth to be extracted.  Request for surgical clearance:  1. What type of surgery is being performed? CATARACT SURGERY   2. When is this surgery scheduled? 05/08/20 & 06/05/20   3. What type of clearance is required (medical clearance vs. Pharmacy clearance to hold med vs. Both)? MEDICAL  4. Are there any medications that need to be held prior to surgery and how long? DOES NOT NEED TO STOP MEDICATIONS INCLUDING ANY BLOOD THINNERS   5. Practice name and name of physician performing surgery? [facility]; DR. Eulas Post   6. What is the office phone number? [phone]   7.   What is the office fax number? [phone]  8.   Anesthesia type (None, local, MAC, general) ? NOT LISTED    [name] 04/30/2020, 2:41 PM  _________________________________________________________________   (provider comments below)

## 2020-05-01 ENCOUNTER — Ambulatory Visit (INDEPENDENT_AMBULATORY_CARE_PROVIDER_SITE_OTHER): Payer: Medicare Other

## 2020-05-01 DIAGNOSIS — I495 Sick sinus syndrome: Secondary | ICD-10-CM | POA: Diagnosis not present

## 2020-05-01 NOTE — Telephone Encounter (Signed)
   Primary Cardiologist: Cristopher Peru, MD  Chart reviewed as part of pre-operative protocol coverage. Cataract extractions are recognized in guidelines as low risk surgeries that do not typically require specific preoperative testing or holding of blood thinner therapy. Therefore, given past medical history and time since last visit, based on ACC/AHA guidelines, [name] would be at acceptable risk for the planned procedure without further cardiovascular testing.   I will route this recommendation to the requesting party via Epic fax function and remove from pre-op pool.  Please call with questions.  Tami Lin Ivylynn Hoppes, PA 05/01/2020, 8:57 AM

## 2020-05-02 LAB — CUP PACEART REMOTE DEVICE CHECK
Battery Remaining Longevity: 114 mo
Battery Remaining Percentage: 100 %
Brady Statistic RA Percent Paced: 73 %
Brady Statistic RV Percent Paced: 0 %
Date Time Interrogation Session: 20211004030000
Implantable Lead Implant Date: 20171009
Implantable Lead Implant Date: 20171009
Implantable Lead Location: 753859
Implantable Lead Location: 753860
Implantable Lead Model: 5076
Implantable Lead Model: 7741
Implantable Lead Serial Number: 785430
Implantable Pulse Generator Implant Date: 20171009
Lead Channel Impedance Value: 1628 Ohm
Lead Channel Impedance Value: 355 Ohm
Lead Channel Pacing Threshold Amplitude: 2.6 V
Lead Channel Pacing Threshold Pulse Width: 0.4 ms
Lead Channel Setting Pacing Amplitude: 2 V
Lead Channel Setting Pacing Amplitude: 2.5 V
Lead Channel Setting Pacing Pulse Width: 0.4 ms
Lead Channel Setting Sensing Sensitivity: 2.5 mV
Pulse Gen Serial Number: 766467

## 2020-05-03 ENCOUNTER — Telehealth: Payer: Self-pay | Admitting: Internal Medicine

## 2020-05-03 DIAGNOSIS — C4441 Basal cell carcinoma of skin of scalp and neck: Secondary | ICD-10-CM | POA: Diagnosis not present

## 2020-05-03 NOTE — Progress Notes (Signed)
°  Chronic Care Management   Outreach Note  05/03/2020 Name: ALIANI CACCAVALE MRN: [id number] DOB: 02/04/1935  Referred by: [name], MD Reason for referral : No chief complaint on file.   An unsuccessful telephone outreach was attempted today. The patient was referred to the pharmacist for assistance with care management and care coordination.   Follow Up Plan:   Carley Perdue [facility] Scheduler

## 2020-05-04 NOTE — Progress Notes (Signed)
Remote pacemaker transmission.   

## 2020-05-09 ENCOUNTER — Telehealth: Payer: Self-pay | Admitting: Internal Medicine

## 2020-05-09 NOTE — Progress Notes (Signed)
  Chronic Care Management   Note  05/09/2020 Name: [name] MRN: [id number] DOB: May 15, 1935  [name] is a 85 y.o. year old female who is a primary care patient of Burns, Claudina Lick, MD. I reached out to [employer] by phone today in response to a referral sent by [name]'s PCP, [name], MD.   Ms. Situ was given information about Chronic Care Management services today including:  1. CCM service includes personalized support from designated clinical staff supervised by her physician, including individualized plan of care and coordination with other care providers 2. 24/7 contact phone numbers for assistance for urgent and routine care needs. 3. Service will only be billed when office clinical staff spend 20 minutes or more in a month to coordinate care. 4. Only one practitioner may furnish and bill the service in a calendar month. 5. The patient may stop CCM services at any time (effective at the end of the month) by phone call to the office staff.   Patient agreed to services and verbal consent obtained.   Follow up plan:   Carley Perdue [facility] Scheduler

## 2020-05-22 DIAGNOSIS — C4441 Basal cell carcinoma of skin of scalp and neck: Secondary | ICD-10-CM | POA: Diagnosis not present

## 2020-05-26 ENCOUNTER — Ambulatory Visit (INDEPENDENT_AMBULATORY_CARE_PROVIDER_SITE_OTHER): Payer: Medicare Other

## 2020-05-26 DIAGNOSIS — Z23 Encounter for immunization: Secondary | ICD-10-CM | POA: Diagnosis not present

## 2020-06-06 ENCOUNTER — Telehealth: Payer: Self-pay | Admitting: Internal Medicine

## 2020-06-06 MED ORDER — METOPROLOL SUCCINATE ER 25 MG PO TB24
25.0000 mg | ORAL_TABLET | Freq: Every day | ORAL | 2 refills | Status: DC
Start: 2020-06-06 — End: 2020-10-15

## 2020-06-06 NOTE — Telephone Encounter (Signed)
*  STAT* If patient is at the pharmacy, call can be transferred to refill team.   1. Which medications need to be refilled? (please list name of each medication and dose if known) metoprolol succinate (TOPROL XL) 25 MG 24 hr tablet  2. Which pharmacy/location (including street and city if local pharmacy) is medication to be sent to? [facility] [address], [place]  3. Do they need a 30 day or 90 day supply? 90 day supply

## 2020-06-06 NOTE — Telephone Encounter (Signed)
Pt's medication was sent to pt's pharmacy as requested. Confirmation received.  °

## 2020-06-19 ENCOUNTER — Ambulatory Visit: Payer: Medicare Other | Admitting: Gastroenterology

## 2020-06-19 DIAGNOSIS — H268 Other specified cataract: Secondary | ICD-10-CM | POA: Diagnosis not present

## 2020-06-19 DIAGNOSIS — H25811 Combined forms of age-related cataract, right eye: Secondary | ICD-10-CM | POA: Diagnosis not present

## 2020-06-19 DIAGNOSIS — H2511 Age-related nuclear cataract, right eye: Secondary | ICD-10-CM | POA: Diagnosis not present

## 2020-06-25 DIAGNOSIS — H2512 Age-related nuclear cataract, left eye: Secondary | ICD-10-CM | POA: Diagnosis not present

## 2020-07-10 DIAGNOSIS — H25812 Combined forms of age-related cataract, left eye: Secondary | ICD-10-CM | POA: Diagnosis not present

## 2020-07-10 DIAGNOSIS — H2512 Age-related nuclear cataract, left eye: Secondary | ICD-10-CM | POA: Diagnosis not present

## 2020-07-10 DIAGNOSIS — H268 Other specified cataract: Secondary | ICD-10-CM | POA: Diagnosis not present

## 2020-07-13 ENCOUNTER — Ambulatory Visit: Payer: Medicare Other | Attending: Internal Medicine

## 2020-07-13 DIAGNOSIS — Z23 Encounter for immunization: Secondary | ICD-10-CM

## 2020-07-13 NOTE — Progress Notes (Signed)
   Covid-19 Vaccination Clinic  Name:  HAELY LEYLAND    MRN: [id number] DOB: 04/23/1935  07/13/2020  Ms. Kidney was observed post Covid-19 immunization for 15 minutes without incident. She was provided with Vaccine Information Sheet and instruction to access the V-Safe system.   Ms. Chenoweth was instructed to call 911 with any severe reactions post vaccine: [name] Difficulty breathing  . Swelling of face and throat  . A fast heartbeat  . A bad rash all over body  . Dizziness and weakness   Immunizations Administered    Name Date Dose VIS Date Route   Pfizer COVID-19 Vaccine 07/13/2020  3:13 PM 0.3 mL 05/16/2020 Intramuscular   Manufacturer: [place]   Lot: [id number]   [place]: [address]-5

## 2020-07-17 ENCOUNTER — Other Ambulatory Visit: Payer: Self-pay

## 2020-07-17 ENCOUNTER — Ambulatory Visit: Payer: Medicare Other | Admitting: Pharmacist

## 2020-07-17 DIAGNOSIS — E782 Mixed hyperlipidemia: Secondary | ICD-10-CM

## 2020-07-17 DIAGNOSIS — I10 Essential (primary) hypertension: Secondary | ICD-10-CM

## 2020-07-17 DIAGNOSIS — M542 Cervicalgia: Secondary | ICD-10-CM

## 2020-07-17 DIAGNOSIS — I5032 Chronic diastolic (congestive) heart failure: Secondary | ICD-10-CM

## 2020-07-17 NOTE — Patient Instructions (Addendum)
Visit Information  Phone number for Pharmacist: [phone]  Thank you for meeting with me to discuss your medications! I look forward to working with you to achieve your health care goals. Below is a summary of what we talked about during the visit:  Goals Addressed            This Visit's Progress   . Pharmacy Care Plan       CARE PLAN ENTRY (see longitudinal plan of care for additional care plan information)  Current Barriers:  . Chronic Disease Management support, education, and care coordination needs related to Hypertension, Hyperlipidemia, Heart Failure, and Back pain   Hypertension / Heart Failure BP Readings from Last 3 Encounters:  04/17/20 (!) 159/60  04/10/20 (!) 148/70  03/30/20 (!) 144/62 .  Pharmacist Clinical Goal(s): o Over the next 90 days, patient will work with PharmD and providers to maintain BP goal <130/80 . Current regimen:  o Metoprolol succinate 25 mg daily o Furosemide 40 mg daily . Interventions: o Discussed BP goals and benefits of medications for prevention of heart attack / stroke o Discussed white coat syndrome - patient's BP is at goal at home . Patient self care activities - Over the next 90 days, patient will: o Check BP 1-2 times weekly, document, and provide at future appointments o Ensure daily salt intake < 2300 mg/day  Hyperlipidemia Lab Results  Component Value Date/Time   LDLCALC 132 (H) 02/03/2020 10:46 AM   LDLDIRECT 164.6 12/13/2012 08:37 AM .  Pharmacist Clinical Goal(s): o Over the next 90 days, patient will work with PharmD and providers to achieve LDL goal < 100 . Current regimen:  o Ezetimibe 10 mg daily . Interventions: o Discussed cholesterol goals and benefits of medications for prevention of heart attack / stroke o Pursue tier exception for ezetimibe (currently Tier 3) since patient cannot take statins . Patient self care activities - Over the next 90 days, patient will: o Continue current medications  Back  pain . Pharmacist Clinical Goal(s) o Over the next 90 days, patient will work with PharmD and providers to optimize therapy . Current regimen:  o Tylenol 500 mg twice a day o Voltaren gel as needed o Pennsaid as needed . Interventions: o Recommend trial of OTC lidocaine patches for back . Patient self care activities - Over the next 90 days, patient will: o Try lidocaine 4% patches over the counter  Medication management . Pharmacist Clinical Goal(s): o Over the next 90 days, patient will work with PharmD and providers to achieve optimal medication adherence . [facility] . Interventions o Comprehensive medication review performed. o Utilize [facility] pharmacy for medication synchronization, packaging and delivery . Patient self care activities - Over the next 90 days, patient will: o Focus on medication adherence by fill date o Take medications as prescribed o Report any questions or concerns to PharmD and/or provider(s)  Initial goal documentation      Ms. Limas was given information about Chronic Care Management services today including:  1. CCM service includes personalized support from designated clinical staff supervised by her physician, including individualized plan of care and coordination with other care providers 2. 24/7 contact phone numbers for assistance for urgent and routine care needs. 3. Standard insurance, coinsurance, copays and deductibles apply for chronic care management only during months in which we provide at least 20 minutes of these services. Most insurances cover these services at 100%, however patients may be responsible for any copay, coinsurance and/or  deductible if applicable. This service may help you avoid the need for more expensive face-to-face services. 4. Only one practitioner may furnish and bill the service in a calendar month. 5. The patient may stop CCM services at any time (effective at the end of the month) by phone  call to the office staff.  Patient agreed to services and verbal consent obtained.   The patient verbalized understanding of instructions, educational materials, and care plan provided today and agreed to receive a mailed copy of patient instructions, educational materials, and care plan.  Telephone follow up appointment with pharmacy team member scheduled for: 3 months  Charlene Brooke, PharmD, BCACP Clinical Pharmacist [facility] [phone]  Chronic Back Pain When back pain lasts longer than 3 months, it is called chronic back pain.The cause of your back pain may not be known. Some common causes include:  Wear and tear (degenerative disease) of the bones, ligaments, or disks in your back.  Inflammation and stiffness in your back (arthritis). People who have chronic back pain often go through certain periods in which the pain is more intense (flare-ups). Many people can learn to manage the pain with home care. Follow these instructions at home: Pay attention to any changes in your symptoms. Take these actions to help with your pain: Activity   Avoid bending and other activities that make the problem worse.  Maintain a proper position when standing or sitting: ? When standing, keep your upper back and neck straight, with your shoulders pulled back. Avoid slouching. ? When sitting, keep your back straight and relax your shoulders. Do not round your shoulders or pull them backward.  Do not sit or stand in one place for long periods of time.  Take brief periods of rest throughout the day. This will reduce your pain. Resting in a lying or standing position is usually better than sitting to rest.  When you are resting for longer periods, mix in some mild activity or stretching between periods of rest. This will help to prevent stiffness and pain.  Get regular exercise. Ask your health care provider what activities are safe for you.  Do not lift anything  that is heavier than 10 lb (4.5 kg). Always use proper lifting technique, which includes: ? Bending your knees. ? Keeping the load close to your body. ? Avoiding twisting.  Sleep on a firm mattress in a comfortable position. Try lying on your side with your knees slightly bent. If you lie on your back, put a pillow under your knees. Managing pain  If directed, apply ice to the painful area. Your health care provider may recommend applying ice during the first 24-48 hours after a flare-up begins. ? Put ice in a plastic bag. ? Place a towel between your skin and the bag. ? Leave the ice on for 20 minutes, 2-3 times per day.  If directed, apply heat to the affected area as often as told by your health care provider. Use the heat source that your health care provider recommends, such as a moist heat pack or a heating pad. ? Place a towel between your skin and the heat source. ? Leave the heat on for 20-30 minutes. ? Remove the heat if your skin turns bright red. This is especially important if you are unable to feel pain, heat, or cold. You may have a greater risk of getting burned.  Try soaking in a warm tub.  Take over-the-counter and prescription medicines only as told  by your health care provider.  Keep all follow-up visits as told by your health care provider. This is important. Contact a health care provider if:  You have pain that is not relieved with rest or medicine. Get help right away if:  You have weakness or numbness in one or both of your legs or feet.  You have trouble controlling your bladder or your bowels.  You have nausea or vomiting.  You have pain in your abdomen.  You have shortness of breath or you faint. This information is not intended to replace advice given to you by your health care provider. Make sure you discuss any questions you have with your health care provider. Document Revised: 11/04/2018 Document Reviewed: 01/21/2017 Elsevier Patient Education   [place].

## 2020-07-17 NOTE — Chronic Care Management (AMB) (Signed)
Chronic Care Management Pharmacy  Name: [name]  MRN: [id number] DOB: [birth year]   Chief Complaint/ HPI  [name],  85 y.o. , female presents for her Initial CCM visit with the clinical pharmacist via telephone due to COVID-19 Pandemic.  PCP : [name], MD Patient Care Team: [name], MD as PCP - General (Internal Medicine) Evans Lance, MD as PCP - Cardiology (Cardiology) Ladene Artist, MD (Gastroenterology) Servando Salina, MD (Obstetrics and Gynecology) Melida Quitter, MD as Consulting Physician (Otolaryngology) Allyn Kenner, MD (Dermatology) Calvert Cantor, MD (Ophthalmology) Berle Mull, MD (Sports Medicine) Jovita Gamma, MD as Consulting Physician (Neurosurgery) Evans Lance, MD as Consulting Physician (Cardiology) Martinique, Peter M, MD as Consulting Physician (Cardiology) Charlton Haws, [facility] as Pharmacist (Pharmacist)  Patient's chronic conditions include: Hypertension, Hyperlipidemia, Heart Failure, GERD, Chronic Kidney Disease, Osteoporosis, Osteoarthritis and Allergic Rhinitis, aortic atherosclerosis, hx breast cancer, sick sinus syndrome w/ pacemaker  Office Visits: 04/10/20 Dr Quay Burow OV: f/u for cellulitis of L arm. Chronic SOB - cardiologist says related to heart, has seen pulm and determined not related to lungs. Advised to increase exercise gradually. Arm swelling did not improve with Keflex, ordered venous US - no DVT.  Consult Visit:  05/03/20 Dr Danielle Dess (dermatology): f/u for basal cell carcinoma 05/01/20 Dr Lovena Le (cardiology): f/u for sick sinus syndrome 04/23/20 Dr Eulas Post (ophthalmology): f/u for cataracts 04/17/20 Dr Fuller Plan (GI): upper endoscopy performed - small hiatal hernia, gastritis biopsied. 03/20/20 Dr Fuller Plan (GI): eval for dysphagia. Ordered barium swallow and EGD. 02/14/20 Dr Lovena Le (cardiology): f/u for sick sinus, pacemaker. HF symptoms are class 2, advised to increase exercise. CAD - occluded apical LAD w/o  angina, advised watchful waiting.  Subjective: Patient lives with husband. She is complaining of 6 weeks of "shakiness" - she wakes up each morning and her hands are shaking, but this improves throughout the day. She also complains of severe shivering when she is cold, which occurs even at room temperature in the 70s.  Objective: Allergies  Allergen Reactions  . Statins     Lipitor caused hospitalization - - depletion of electrolytes, fever, nausea, loss of appetite, couldn't get out of bed  . Cymbalta [Duloxetine Hcl] Other (See Comments)    Dulled her too much, difficulty urinating, change in vision  . Fosamax [Alendronate Sodium] Other (See Comments)    Caused chronic issues swallowing  . Amitriptyline     hallucinations  . Lyrica [Pregabalin]     Unknown  . Neurontin [Gabapentin] Other (See Comments)    Dizziness and sedation (patient is tolerating in lower dose)  . Zanaflex [Tizanidine] Other (See Comments)    Could not sleep    Medications: Outpatient Encounter Medications as of 07/17/2020  Medication Sig  . acetaminophen (TYLENOL) 500 MG tablet Take 500 mg by mouth 2 (two) times daily.  [name] Besifloxacin HCl (BESIVANCE) 0.6 % SUSP Apply to eye.  . cholecalciferol (VITAMIN D) 1000 UNITS tablet Take 2,000 Units by mouth daily.   . Cyanocobalamin 1000 MCG TBCR Take 1,000 mcg by mouth daily.   [name] denosumab (PROLIA) 60 MG/ML SOSY injection Inject 60 mg into the skin every 6 (six) months.  . Diclofenac Sodium (PENNSAID) 2 % SOLN Apply topically.  . diclofenac Sodium (VOLTAREN) 1 % GEL Apply 2 g topically 4 (four) times daily.  [name] ezetimibe (ZETIA) 10 MG tablet Take 1 tablet (10 mg total) by mouth daily.  . ferrous sulfate 325 (65 FE) MG tablet Take 325 mg by  mouth 3 (three) times a week.  . furosemide (LASIX) 40 MG tablet TAKE 1 TABLET ONCE DAILY.  [name] ketorolac (ACULAR) 0.5 % ophthalmic solution 1 drop 4 (four) times daily.  [name] loratadine (CLARITIN) 10 MG tablet Take 10 mg by mouth  daily as needed for allergies.  . metoprolol succinate (TOPROL XL) 25 MG 24 hr tablet Take 1 tablet (25 mg total) by mouth daily.  [name] omeprazole (PRILOSEC) 40 MG capsule TAKE ONE CAPSULE BY MOUTH TWICE A DAY  . Polyethyl Glycol-Propyl Glycol (LUBRICANT EYE DROPS) 0.4-0.3 % SOLN Place 1 drop into both eyes 3 (three) times daily as needed (dry/irritated eyes.).  [name] polyethylene glycol (MIRALAX / GLYCOLAX) packet Take 17 g by mouth daily as needed (constipation).  . potassium chloride (KLOR-CON) 10 MEQ tablet Take 2 tablets (20 mEq total) by mouth daily.  . prednisoLONE acetate (PRED FORTE) 1 % ophthalmic suspension 1 drop 4 (four) times daily.  . sodium chloride (OCEAN) 0.65 % SOLN nasal spray Place 1 spray into both nostrils 4 (four) times daily as needed for congestion.  . [DISCONTINUED] doxycycline (VIBRA-TABS) 100 MG tablet Take 1 tablet (100 mg total) by mouth 2 (two) times daily.   No facility-administered encounter medications on file as of 07/17/2020.    Wt Readings from Last 3 Encounters:  04/17/20 162 lb (73.5 kg)  04/10/20 162 lb 3.2 oz (73.6 kg)  03/30/20 159 lb (72.1 kg)    Lab Results  Component Value Date   CREATININE 1.23 (H) 03/30/2020   BUN 22 03/30/2020   GFR 44.65 (L) 12/27/2019   GFRNONAA 41 (L) 03/30/2020   GFRAA 47 (L) 03/30/2020   NA 137 03/30/2020   K 4.9 03/30/2020   CALCIUM 9.4 03/30/2020   CO2 25 03/30/2020     Current Diagnosis/Assessment:  SDOH Interventions   Flowsheet Row Most Recent Value  SDOH Interventions   Financial Strain Interventions Other (Comment)  [ezetimibe tier exception attempted]      Goals Addressed            This Visit's Progress   . Pharmacy Care Plan       CARE PLAN ENTRY (see longitudinal plan of care for additional care plan information)  Current Barriers:  . Chronic Disease Management support, education, and care coordination needs related to Hypertension, Hyperlipidemia, Heart Failure, and Back pain    Hypertension / Heart Failure BP Readings from Last 3 Encounters:  04/17/20 (!) 159/60  04/10/20 (!) 148/70  03/30/20 (!) 144/62 .  Pharmacist Clinical Goal(s): o Over the next 90 days, patient will work with PharmD and providers to maintain BP goal <130/80 . Current regimen:  o Metoprolol succinate 25 mg daily o Furosemide 40 mg daily . Interventions: o Discussed BP goals and benefits of medications for prevention of heart attack / stroke o Discussed white coat syndrome - patient's BP is at goal at home . Patient self care activities - Over the next 90 days, patient will: o Check BP 1-2 times weekly, document, and provide at future appointments o Ensure daily salt intake < 2300 mg/day  Hyperlipidemia Lab Results  Component Value Date/Time   LDLCALC 132 (H) 02/03/2020 10:46 AM   LDLDIRECT 164.6 12/13/2012 08:37 AM .  Pharmacist Clinical Goal(s): o Over the next 90 days, patient will work with PharmD and providers to achieve LDL goal < 100 . Current regimen:  o Ezetimibe 10 mg daily . Interventions: o Discussed cholesterol goals and benefits of medications for prevention of heart attack /  stroke o Pursue tier exception for ezetimibe (currently Tier 3) since patient cannot take statins . Patient self care activities - Over the next 90 days, patient will: o Continue current medications  Back pain . Pharmacist Clinical Goal(s) o Over the next 90 days, patient will work with PharmD and providers to optimize therapy . Current regimen:  o Tylenol 500 mg twice a day o Voltaren gel as needed o Pennsaid as needed . Interventions: o Recommend trial of OTC lidocaine patches for back . Patient self care activities - Over the next 90 days, patient will: o Try lidocaine 4% patches over the counter  Medication management . Pharmacist Clinical Goal(s): o Over the next 90 days, patient will work with PharmD and providers to achieve optimal medication adherence . [facility] . Interventions o Comprehensive medication review performed. o Utilize [facility] for medication synchronization, packaging and delivery . Patient self care activities - Over the next 90 days, patient will: o Focus on medication adherence by fill date o Take medications as prescribed o Report any questions or concerns to PharmD and/or provider(s)  Initial goal documentation       Heart Failure / Hypertension   Type: Diastolic, grade 2 dysfunction Last ejection fraction: 60-65% (11/21/2016)  BP goal is:  <140/90 BP Readings from Last 3 Encounters:  04/17/20 (!) 159/60  04/10/20 (!) 148/70  03/30/20 (!) 144/62   Pulse Readings from Last 3 Encounters:  04/17/20 60  04/10/20 60  03/30/20 61   Patient checks BP at home several times per month Patient home BP readings are ranging: 113/48 - 140/56, HR 60s   Patient has failed these meds in past: isosorbide [place] Patient is currently controlled on the following medications:  [name] Metoprolol succinate 25 mg daily HS . Furosemide 40 mg daily  . Klor-Con 10 mEq - 2 tab daily   We discussed: BP goals; benefits of medications; likely white coat syndrome contributing to high BP in office, pt reports most BP < 130/80 at home;  -pt asked about skipping doses of furosemide; discussed importance of furosemide for maintaining fluid status and preventing edema/SOB; advised against skipping doses, but if furosemide doses are skipped, advised to also skip potassium  Plan  Continue current medications   Hyperlipidemia   LDL goal < 100  Last lipids Lab Results  Component Value Date   CHOL 215 (H) 02/03/2020   HDL 58 02/03/2020   LDLCALC 132 (H) 02/03/2020   LDLDIRECT 164.6 12/13/2012   TRIG 133 02/03/2020   CHOLHDL 3.7 02/03/2020   Hepatic Function Latest Ref Rng & Units 03/30/2020 02/03/2020 12/27/2019  Total Protein 6.1 - 8.1 g/dL 6.8 6.5 6.1  Albumin 3.5 - 5.2 g/dL - - -  AST 10 - 35 U/L 11 11 -  ALT 6 - 29 U/L 8 10 -   Alk Phosphatase 39 - 117 U/L - - -  Total Bilirubin 0.2 - 1.2 mg/dL 0.4 0.4 -  Bilirubin, Direct 0.0 - 0.3 mg/dL - - -    The ASCVD Risk score Mikey Bussing DC Jr., et al., 2013) failed to calculate for the following reasons:   The 2013 ASCVD risk score is only valid for ages 7 to 13   Patient has failed these meds in past: atorvastatin (caused hospitalization) Patient is currently uncontrolled on the following medications:  . Ezetimibe 10 mg daily AM  We discussed:  diet and exercise extensively; Cholesterol goals; benefits of ezetimibe for ASCVD risk reduction; pt reports high  copay for ezetimibe - per formulary it is tier 3, so she has been using [facility] card to get it for $18 for 90 days. Pt cannot take statins since atorvastatin caused a hospitalization, may attempt tier exception for cheaper copay  Plan  Continue current medications  Tier exception for Ezetimibe   GERD   Patient has failed these meds in past: n/a Patient is currently controlled on the following medications:  . Omeprazole 40 mg BID  We discussed:  Pt reports some breakthrough symptoms but her main issuing is swallowing, she has f/u with GI this week and plans to discuss issues  Plan  Continue current medications  Constipation   Patient has failed these meds in past: n/a Patient is currently controlled on the following medications:  [name] Miralax PRN  We discussed: pt used to take Miralax daily, no longer taking as constipation has not been an issues; she has f/u with GI this week   Plan  Continue current medications  Osteoporosis   Last DEXA Scan: 07/06/2018  T-Score femoral neck: -2.0  T-Score total hip: n/a  T-Score lumbar spine: -3.7  T-Score forearm radius: n/a  Last vitamin D/Calcium Lab Results  Component Value Date   VD25OH 42 02/03/2020   CALCIUM 9.4 03/30/2020   Patient is a candidate for pharmacologic treatment due to T-Score < -2.5 in lumbar spine  Patient has failed these meds in  past: alendronate Patient is currently controlled on the following medications:  . Prolia 60 mg q6 months (last given 02/03/2020) . Vitamin D 1000 IU - 2 cap AM  We discussed:  Recommend [phone] units of vitamin D daily. Recommend 1200 mg of calcium daily from dietary and supplemental sources.  Plan  Continue current medications  Osteoarthritis   Neck pain Hx compression fracture  Patient has failed these meds in past: n/a Patient is currently controlled on the following medications:  . Tylenol 500 mg BID . Voltaren gel 1% . Pennsaid 2%  We discussed: Pt reports back pain is still significant despite current therapy; she has not noticed much improvement with topicals; advised to try OTC lidocaine patch on back pain  Plan  Recommend OTC lidocaine 4% patch for back  Anemia   CBC Latest Ref Rng & Units 03/30/2020 02/03/2020 12/27/2019  WBC 3.8 - 10.8 Thousand/uL 7.8 7.2 4.8  Hemoglobin 11.7 - 15.5 g/dL 12.0 11.0(L) 11.3(L)  Hematocrit 35.0 - 45.0 % 36.3 33.2(L) 33.0(L)  Platelets 140 - 400 Thousand/uL 333 294 272.0   Iron/TIBC/Ferritin/ %Sat    Component Value Date/Time   IRON 86 02/03/2020 1046   TIBC 339 02/03/2020 1046   FERRITIN 34 02/03/2020 1046   IRONPCTSAT 25 02/03/2020 1046   Patient has failed these meds in past: n/a Patient is currently controlled on the following medications:  . Ferrous sulfate 325 mg 3x a week  We discussed:  Discussed symptoms of anemia and benefits of iron; pt was told to reduce iron from daily to 3x per week when HGB/iron levels normalized  Plan  Continue current medications  Allergic rhinitis   Patient has failed these meds in past: n/a Patient is currently controlled on the following medications:  . Loratadine 10 mg PRN . Sodium chloride 0.65% nasal spray PRN  We discussed:  Pt reports she has not needed allergy meds much since moving to apartment  Plan  Continue current medications  Health Maintenance   Lab Results   Component Value Date/Time   [id number] 662 03/17/2016 07:34 AM  VITAMINB12 >1500 (H) 02/26/2011 10:08 AM   Patient is currently controlled on the following medications:  [name] Vitamin B12 1000 mcg daily AM . Systane eye drops   We discussed:  Patient is satisfied with current regimen and denies issues Referred pt to PCP to discuss hand shaking/shivering issues. Discussed thyroid issues can cause cold intolerance, however last TSH from July was normal.  Plan  Continue current medications  Medication Management   Patient's preferred pharmacy is:  [facility] [address] [place] - [place] [address] Phone: [phone] Fax: [phone]  [facility] - [place], [place] - [address] [place] [address] Phone: [phone] Fax: [phone]  Uses pill box? Yes Pt endorses 100% compliance  We discussed: Verbal consent obtained for [facility] enhanced pharmacy services (medication synchronization, adherence packaging, delivery coordination). A medication sync plan was created to allow patient to get all medications delivered once every 30 to 90 days per patient preference. Patient understands they have freedom to choose pharmacy and clinical pharmacist will coordinate care between all prescribers and [facility].   Plan  Continue current medication management strategy    Follow up: 3 month phone visit  Charlene Brooke, PharmD, [facility] Clinical Pharmacist [facility] [phone]

## 2020-07-18 ENCOUNTER — Encounter: Payer: Self-pay | Admitting: Gastroenterology

## 2020-07-18 ENCOUNTER — Ambulatory Visit (INDEPENDENT_AMBULATORY_CARE_PROVIDER_SITE_OTHER): Payer: Medicare Other | Admitting: Gastroenterology

## 2020-07-18 ENCOUNTER — Telehealth: Payer: Self-pay | Admitting: Internal Medicine

## 2020-07-18 VITALS — BP 140/60 | HR 64 | Ht 61.0 in | Wt 160.2 lb

## 2020-07-18 DIAGNOSIS — R1312 Dysphagia, oropharyngeal phase: Secondary | ICD-10-CM

## 2020-07-18 DIAGNOSIS — R152 Fecal urgency: Secondary | ICD-10-CM

## 2020-07-18 DIAGNOSIS — R1030 Lower abdominal pain, unspecified: Secondary | ICD-10-CM | POA: Diagnosis not present

## 2020-07-18 DIAGNOSIS — R490 Dysphonia: Secondary | ICD-10-CM | POA: Diagnosis not present

## 2020-07-18 MED ORDER — DICYCLOMINE HCL 10 MG PO CAPS: 10.0000 mg | ORAL_CAPSULE | Freq: Three times a day (TID) | ORAL | 11 refills | Status: DC

## 2020-07-18 MED ORDER — FUROSEMIDE 40 MG PO TABS: 40.0000 mg | ORAL_TABLET | Freq: Every day | ORAL | 2 refills | Status: DC

## 2020-07-18 NOTE — Patient Instructions (Signed)
We have sent the following medications to your pharmacy for you to pick up at your convenience: dicyclomine.   Stay on omeprazole.   You can start a over the counter probiotic such as Align, Publishing copy or Fiserv daily.   We have referred you to Dr. Melony Overly at [facility] ear, nose [facility] throat. They will contact you with an appointment.   Thank you for choosing me and [facility].  Pricilla Riffle. Dagoberto Ligas., MD., Marval Regal

## 2020-07-18 NOTE — Progress Notes (Signed)
    History of Present Illness: This is an 85 year old female complaining of choking and strangling.  Last evening while not eating or drinking she got strangled with coughing and short period of difficulty breathing.  She states she has sinus drainage that frequently difficult to clear.  She has ongoing difficulties with hoarseness.  Dilation of the nonobstructing distal esophageal stricture in September has not changed her symptoms.  The oropharyngeal pharyngeal portion of her swallowing study in August was abnormal.  She was felt to have a mild aspiration risk  EGD 9/20321 - Benign-appearing esophageal stenosis. Dilated to 16 mm. - Small hiatal hernia. - Gastritis. Biopsied. - Normal duodenal bulb and second portion of the duodenum  Current Medications, Allergies, Past Medical History, Past Surgical History, Family History and Social History were reviewed in [employer] record.   Physical Exam: General: Well developed, well nourished, no acute distress Head: Normocephalic and atraumatic Eyes:  sclerae anicteric, EOMI Ears: Normal auditory acuity Mouth: Not examined, mask on during Covid-19 pandemic Lungs: Clear throughout to auscultation Heart: Regular rate and rhythm; no murmurs, rubs or bruits Abdomen: Soft, non tender and non distended. No masses, hepatosplenomegaly or hernias noted. Normal Bowel sounds Rectal:  Not done Musculoskeletal: Symmetrical with no gross deformities  Pulses:  Normal pulses noted Extremities: No clubbing, cyanosis, edema or deformities noted Neurological: Alert oriented x 4, grossly nonfocal Psychological:  Alert and cooperative. Normal mood and affect   Assessment and Recommendations:  1. GERD with an esophageal stricture.  Esophageal dilation did not change her symptoms.  Follow antireflux measures.  Continue omeprazole 40 mg twice daily. REV in 1 year.   2. Oropharyngeal dysphagia per MBSS.  Hoarseness.  Possible sinus  drainage.  ENT referral for further evaluation.  3. Urgent bowel movements with lower abdominal pain.  Suspected IBS.  Trial of at least 3 different probiotics, as listed below, for a couple weeks each and assess response.  Begin dicyclomine 10 mg p.o. 3 times daily AC.  Call in 6 to 8 weeks if symptoms not improved. REV in 1 year.

## 2020-07-18 NOTE — Telephone Encounter (Signed)
Pt's medication was sent to pt's pharmacy as requested. Confirmation received.  °

## 2020-07-18 NOTE — Telephone Encounter (Signed)
°*  STAT* If patient is at the pharmacy, call can be transferred to refill team.   1. Which medications need to be refilled? (please list name of each medication and dose if known)  furosemide (LASIX) 40 MG tablet  2. Which pharmacy/location (including street and city if local pharmacy) is medication to be sent to? [facility] - [place], [place] - [address]  3. Do they need a 30 day or 90 day supply? 90 day supply   [facility] is calling to request that Dr. Lovena Le send over the prescription for this medication.

## 2020-07-24 ENCOUNTER — Other Ambulatory Visit: Payer: Self-pay

## 2020-07-24 DIAGNOSIS — I10 Essential (primary) hypertension: Secondary | ICD-10-CM

## 2020-07-27 NOTE — Progress Notes (Signed)
Subjective:    Patient ID: [name], female    DOB: 03/12/1935, 85 y.o.   MRN: [id number]  HPI The patient is here for an acute visit for hand shaking, shivering.  She gets shivering and teeth chattering when she gets cold.  When she puts on extra layers then she is fine.  She states this usually does not happen at home-often will occur when she is outside of her house.  In addition to that type of shivering and shakes she does have bilateral hand shaking or tremors.  She thinks this started 2-3 months ago.  She noticed it if she tries to hold something in her hands.  She did discuss with our pharmacist if any of her medications would cause the shaking, but it was not thought to be related to any of them.   She has been on a lot of drops for her eyes after her cataract surgeries.  She was on prednisone drops for a while.  She was concerned that may have been a cause.  She stopped those 1-2 weeks ago.  She has continued to have intermittent shaking.  She was on Lexapro for a brief period of time, but stopped that because she thought that was causing shaking.  Since stopping the medication the shaking has persisted.  She states difficulty with fine motor skills - picking up a pill.    She does not do good with writing - she always messes up.  She often switches numbers when writing a check for example.  Her writing is not as good as it used to be.   She has chronic back pain, chronic neck pain and DOE.  She tires easily.  She is unsure if that is related to the shaking.  She is frustrated by all of her medical problems.     normal EMG UE 12.2018.   Medications and allergies reviewed with patient and updated if appropriate.  Patient Active Problem List   Diagnosis Date Noted  . Left arm swelling 04/10/2020  . Aortic atherosclerosis (HCC) 03/30/2020  . Cellulitis of arm, left 03/30/2020  . Chronic thoracic back pain 02/03/2020  . Iron deficiency anemia 02/02/2020  . Near  syncope 09/27/2019  . Hypertension 09/20/2019  . Acute pain of right knee 08/03/2019  . (HFpEF) heart failure with preserved ejection fraction (HCC) 08/02/2019  . Pacemaker 07/19/2019  . CKD (chronic kidney disease) 01/30/2019  . Lumbar spinal stenosis 01/10/2018  . Osteoarthritis of both knees 12/15/2017  . Epigastric pain 09/09/2017  . Neuralgia 09/09/2017  . Claudication of calf muscles (HCC) 11/22/2016  . DOE (dyspnea on exertion) 11/22/2016  . Hyperreflexia of lower extremity 07/16/2016  . Bilateral leg paresthesia 07/16/2016  . Carotid arterial disease (HCC) 07/13/2016  . Poor balance 06/23/2016  . Weakness of both lower extremities 06/23/2016  . Sinus node dysfunction (HCC) 05/05/2016  . Right shoulder pain 03/14/2016  . Multinodular goiter (nontoxic) 02/21/2016  . Neck pain 02/15/2016  . Prediabetes 12/17/2015  . Dysphagia 12/17/2015  . T8 vertebral fracture (HCC) 04/09/2015  . Overweight 10/09/2011  . Allergic rhinitis, cause unspecified   . GERD (gastroesophageal reflux disease) 02/05/2011  . Vitamin B 12 deficiency 04/17/2009  . Hyperlipidemia 04/17/2009  . Osteoporosis 04/17/2009  . ADENOCARCINOMA, BREAST, HX OF 04/17/2009  . CONSTIPATION 03/08/2009  . COLONIC POLYPS, HYPERPLASTIC, HX OF 03/06/2009    Current Outpatient Medications on File Prior to Visit  Medication Sig Dispense Refill  . acetaminophen (TYLENOL) 500 MG tablet  Take 500 mg by mouth 2 (two) times daily.    . cholecalciferol (VITAMIN D) 1000 UNITS tablet Take 2,000 Units by mouth daily.     . Cyanocobalamin 1000 MCG TBCR Take 1,000 mcg by mouth daily.     [name] denosumab (PROLIA) 60 MG/ML SOSY injection Inject 60 mg into the skin every 6 (six) months.    . Diclofenac Sodium (PENNSAID) 2 % SOLN Apply topically.    . diclofenac Sodium (VOLTAREN) 1 % GEL Apply 2 g topically 4 (four) times daily.    [name] dicyclomine (BENTYL) 10 MG capsule Take 1 capsule (10 mg total) by mouth 3 (three) times daily before  meals. 90 capsule 11  . ezetimibe (ZETIA) 10 MG tablet Take 1 tablet (10 mg total) by mouth daily. 90 tablet 3  . ferrous sulfate 325 (65 FE) MG tablet Take 325 mg by mouth 3 (three) times a week.    . furosemide (LASIX) 40 MG tablet Take 1 tablet (40 mg total) by mouth daily. 90 tablet 2  . loratadine (CLARITIN) 10 MG tablet Take 10 mg by mouth daily as needed for allergies.    . metoprolol succinate (TOPROL XL) 25 MG 24 hr tablet Take 1 tablet (25 mg total) by mouth daily. 90 tablet 2  . omeprazole (PRILOSEC) 40 MG capsule TAKE ONE CAPSULE BY MOUTH TWICE A DAY 60 capsule 11  . Polyethyl Glycol-Propyl Glycol (LUBRICANT EYE DROPS) 0.4-0.3 % SOLN Place 1 drop into both eyes 3 (three) times daily as needed (dry/irritated eyes.).    [name] polyethylene glycol (MIRALAX / GLYCOLAX) packet Take 17 g by mouth daily as needed (constipation).    . sodium chloride (OCEAN) 0.65 % SOLN nasal spray Place 1 spray into both nostrils 4 (four) times daily as needed for congestion.    . potassium chloride (KLOR-CON) 10 MEQ tablet Take 2 tablets (20 mEq total) by mouth daily. 180 tablet 3   No current facility-administered medications on file prior to visit.    Past Medical History:  Diagnosis Date  . Allergy   . Anemia   . Cancer of left breast ([place]) 1978  . CHF (congestive heart failure) ([place])   . Chronic thoracic back pain    "T8; fracture; 03/2015; no OR" (05/05/2016)  . GERD (gastroesophageal reflux disease)   . Heart murmur   . History of hiatal hernia   . Hyperlipidemia   . Hyperplastic colon polyp   . Hypertension   . IBS (irritable bowel syndrome)   . Multiple thyroid nodules   . Osteoporosis    T8 compression fx 03/2015   . Personal history of radiation therapy   . Presence of permanent cardiac pacemaker   . Vitamin B12 deficiency     Past Surgical History:  Procedure Laterality Date  . ANTERIOR CERVICAL DECOMP/DISCECTOMY FUSION  2008   C5-6  . AUGMENTATION MAMMAPLASTY    . BACK SURGERY     . BREAST SURGERY    . CARDIAC CATHETERIZATION  2001  . EP IMPLANTABLE DEVICE N/A 05/05/2016   Procedure: Pacemaker Implant;  Surgeon: Evans Lance, MD;  Location: [facility];  Service: Cardiovascular;  Laterality: N/A;  . EXCISIONAL HEMORRHOIDECTOMY  1984   With subsequent correction of surgery  . INSERT / REPLACE / REMOVE PACEMAKER    . INTRAVASCULAR PRESSURE WIRE/FFR STUDY N/A 10/07/2019   Procedure: INTRAVASCULAR PRESSURE WIRE/FFR STUDY;  Surgeon: Jettie Booze, MD;  Location: [facility];  Service: Cardiovascular;  Laterality: N/A;  .  KNEE ARTHROSCOPY Left    "meniscus tear"  . LEFT HEART CATH AND CORONARY ANGIOGRAPHY N/A 10/07/2019   Procedure: LEFT HEART CATH AND CORONARY ANGIOGRAPHY;  Surgeon: Jettie Booze, MD;  Location: [facility];  Service: Cardiovascular;  Laterality: N/A;  . MASTECTOMY Left 1978  . PLACEMENT OF BREAST IMPLANTS Left 1981  . REDUCTION MAMMAPLASTY Right   . SHOULDER ARTHROSCOPY W/ ROTATOR CUFF REPAIR Left 02/2006   "tear"  . TUBAL LIGATION      Social History   Socioeconomic History  . Marital status: Married    Spouse name: Not on file  . Number of children: 2  . Years of education: Not on file  . Highest education level: Not on file  Occupational History  . Occupation: retired  Tobacco Use  . Smoking status: Never Smoker  . Smokeless tobacco: Never Used  Vaping Use  . Vaping Use: Never used  Substance and Sexual Activity  . Alcohol use: No    Alcohol/week: 0.0 standard drinks  . Drug use: No  . Sexual activity: Not Currently  Other Topics Concern  . Not on file  Social History Narrative   Housewife, Lives with spouse, 2 children   Social Determinants of Health   Financial Resource Strain: Medium Risk  . Difficulty of Paying Living Expenses: Somewhat hard  Food Insecurity: Not on file  Transportation Needs: Not on file  Physical Activity: Not on file  Stress: Not on file  Social Connections: Not on  file    Family History  Problem Relation Age of Onset  . Diabetes Sister   . Diabetes Brother   . Breast cancer Maternal Aunt   . Colon cancer Neg Hx   . Stomach cancer Neg Hx     Review of Systems  Constitutional: Negative for fever.  Respiratory: Positive for shortness of breath.   Neurological: Positive for tremors and numbness (finger tips - goes away if moves hands, left arm - related to neck). Negative for dizziness and weakness.       Off balanced, generalized weakness       Objective:   Vitals:   07/30/20 1055  BP: 138/68  Pulse: 60  Temp: 97.9 F (36.6 C)  SpO2: 99%   BP Readings from Last 3 Encounters:  07/30/20 138/68  07/18/20 140/60  04/17/20 (!) 159/60   Wt Readings from Last 3 Encounters:  07/30/20 157 lb (71.2 kg)  07/18/20 160 lb 3.2 oz (72.7 kg)  04/17/20 162 lb (73.5 kg)   Body mass index is 29.66 kg/m.   Physical Exam Constitutional:      General: She is not in acute distress.    Appearance: Normal appearance. She is not ill-appearing.  HENT:     Head: Normocephalic and atraumatic.  Cardiovascular:     Rate and Rhythm: Normal rate and regular rhythm.  Pulmonary:     Effort: Pulmonary effort is normal. No respiratory distress.     Breath sounds: No wheezing or rales.  Musculoskeletal:     Right lower leg: No edema.     Left lower leg: No edema.  Skin:    General: Skin is warm and dry.  Neurological:     Mental Status: She is alert and oriented to person, place, and time.     Sensory: No sensory deficit.     Motor: No weakness.     Comments: Intermittent tremor at times when hands are rest or outstretched  Assessment & Plan:    See Problem List for Assessment and Plan of chronic medical problems.    F/u in 3 months.  Advised discussing DOE with cardiology again.    This visit occurred during the SARS-CoV-2 public health emergency.  Safety protocols were in place, including screening questions prior to the  visit, additional usage of staff PPE, and extensive cleaning of exam room while observing appropriate contact time as indicated for disinfecting solutions.

## 2020-07-30 ENCOUNTER — Other Ambulatory Visit: Payer: Self-pay

## 2020-07-30 ENCOUNTER — Ambulatory Visit (INDEPENDENT_AMBULATORY_CARE_PROVIDER_SITE_OTHER): Payer: Medicare Other | Admitting: Internal Medicine

## 2020-07-30 ENCOUNTER — Encounter: Payer: Self-pay | Admitting: Internal Medicine

## 2020-07-30 VITALS — BP 138/68 | HR 60 | Temp 97.9°F | Ht 61.0 in | Wt 157.0 lb

## 2020-07-30 DIAGNOSIS — R251 Tremor, unspecified: Secondary | ICD-10-CM | POA: Diagnosis not present

## 2020-07-30 MED ORDER — ESCITALOPRAM OXALATE 5 MG PO TABS: 5.0000 mg | ORAL_TABLET | Freq: Every day | ORAL | 5 refills | Status: DC

## 2020-07-30 NOTE — Patient Instructions (Addendum)
  Medications changes include :  Restart the lexapro 5 mg daily     A referral was ordered for [facility].      Someone from their office will call you to schedule an appointment.     Please followup in 3 months

## 2020-07-30 NOTE — Assessment & Plan Note (Signed)
Acute B/l hands at rest ? Parkinson's vs other Will refer to neurology for further evaluation Discussed that the this is unlikely related to her other chronic conditions

## 2020-07-31 ENCOUNTER — Telehealth: Payer: Self-pay | Admitting: Internal Medicine

## 2020-07-31 ENCOUNTER — Ambulatory Visit (INDEPENDENT_AMBULATORY_CARE_PROVIDER_SITE_OTHER): Payer: Medicare Other

## 2020-07-31 DIAGNOSIS — R251 Tremor, unspecified: Secondary | ICD-10-CM

## 2020-07-31 DIAGNOSIS — I495 Sick sinus syndrome: Secondary | ICD-10-CM | POA: Diagnosis not present

## 2020-07-31 LAB — CUP PACEART REMOTE DEVICE CHECK
Battery Remaining Longevity: 114 mo
Battery Remaining Percentage: 100 %
Brady Statistic RA Percent Paced: 75 %
Brady Statistic RV Percent Paced: 0 %
Date Time Interrogation Session: 20220104030000
Implantable Lead Implant Date: 20171009
Implantable Lead Implant Date: 20171009
Implantable Lead Location: 753859
Implantable Lead Location: 753860
Implantable Lead Model: 5076
Implantable Lead Model: 7741
Implantable Lead Serial Number: 785430
Implantable Pulse Generator Implant Date: 20171009
Lead Channel Impedance Value: 1726 Ohm
Lead Channel Impedance Value: 345 Ohm
Lead Channel Pacing Threshold Amplitude: 2.5 V
Lead Channel Pacing Threshold Pulse Width: 0.4 ms
Lead Channel Setting Pacing Amplitude: 2 V
Lead Channel Setting Pacing Amplitude: 2.5 V
Lead Channel Setting Pacing Pulse Width: 0.4 ms
Lead Channel Setting Sensing Sensitivity: 2.5 mV
Pulse Gen Serial Number: 766467

## 2020-07-31 NOTE — Telephone Encounter (Signed)
Patient called regarding her referral to [facility]  They reached out to her and the next available won't be until March 2022. Due to it being pushed out she rather see Dr. Allena Katz instead due to her already being established with her.   Can you send a new referral for Dr. Nita Sickle?   The best contact number for the patient is [phone].

## 2020-08-01 NOTE — Telephone Encounter (Signed)
Referral ordered

## 2020-08-01 NOTE — Telephone Encounter (Signed)
She may not be able to be seen sooner - most likely she will not be able to be seen sooner - I can refer her to Dr patel if she wishes

## 2020-08-02 ENCOUNTER — Encounter: Payer: Self-pay | Admitting: Neurology

## 2020-08-02 ENCOUNTER — Telehealth: Payer: Self-pay | Admitting: Pharmacist

## 2020-08-02 NOTE — Progress Notes (Signed)
Chronic Care Management Pharmacy Assistant   Name: PAULEEN GOLEMAN  MRN: [id number] DOB: January 16, 1935  Reason for Encounter: Medication Review   PCP : Pincus Sanes, MD  Allergies:   Allergies  Allergen Reactions   Statins     Lipitor caused hospitalization - - depletion of electrolytes, fever, nausea, loss of appetite, couldn't get out of bed   Cymbalta [Duloxetine Hcl] Other (See Comments)    Dulled her too much, difficulty urinating, change in vision   Fosamax [Alendronate Sodium] Other (See Comments)    Caused chronic issues swallowing   Amitriptyline     hallucinations   Lyrica [Pregabalin]     Unknown   Neurontin [Gabapentin] Other (See Comments)    Dizziness and sedation (patient is tolerating in lower dose)   Zanaflex [Tizanidine] Other (See Comments)    Could not sleep    Medications: Outpatient Encounter Medications as of 08/02/2020  Medication Sig   acetaminophen (TYLENOL) 500 MG tablet Take 500 mg by mouth 2 (two) times daily.   cholecalciferol (VITAMIN D) 1000 UNITS tablet Take 2,000 Units by mouth daily.    Cyanocobalamin 1000 MCG TBCR Take 1,000 mcg by mouth daily.    denosumab (PROLIA) 60 MG/ML SOSY injection Inject 60 mg into the skin every 6 (six) months.   Diclofenac Sodium (PENNSAID) 2 % SOLN Apply topically.   diclofenac Sodium (VOLTAREN) 1 % GEL Apply 2 g topically 4 (four) times daily.   dicyclomine (BENTYL) 10 MG capsule Take 1 capsule (10 mg total) by mouth 3 (three) times daily before meals.   escitalopram (LEXAPRO) 5 MG tablet Take 1 tablet (5 mg total) by mouth daily.   ezetimibe (ZETIA) 10 MG tablet Take 1 tablet (10 mg total) by mouth daily.   ferrous sulfate 325 (65 FE) MG tablet Take 325 mg by mouth 3 (three) times a week.   furosemide (LASIX) 40 MG tablet Take 1 tablet (40 mg total) by mouth daily.   loratadine (CLARITIN) 10 MG tablet Take 10 mg by mouth daily as needed for allergies.   metoprolol succinate (TOPROL  XL) 25 MG 24 hr tablet Take 1 tablet (25 mg total) by mouth daily.   omeprazole (PRILOSEC) 40 MG capsule TAKE ONE CAPSULE BY MOUTH TWICE A DAY   Polyethyl Glycol-Propyl Glycol (LUBRICANT EYE DROPS) 0.4-0.3 % SOLN Place 1 drop into both eyes 3 (three) times daily as needed (dry/irritated eyes.).   polyethylene glycol (MIRALAX / GLYCOLAX) packet Take 17 g by mouth daily as needed (constipation).   potassium chloride (KLOR-CON) 10 MEQ tablet Take 2 tablets (20 mEq total) by mouth daily.   sodium chloride (OCEAN) 0.65 % SOLN nasal spray Place 1 spray into both nostrils 4 (four) times daily as needed for congestion.   No facility-administered encounter medications on file as of 08/02/2020.    Current Diagnosis: Patient Active Problem List   Diagnosis Date Noted   Tremor of both hands 07/30/2020   Left arm swelling 04/10/2020   Aortic atherosclerosis (HCC) 03/30/2020   Cellulitis of arm, left 03/30/2020   Chronic thoracic back pain 02/03/2020   Iron deficiency anemia 02/02/2020   Near syncope 09/27/2019   Hypertension 09/20/2019   Acute pain of right knee 08/03/2019   (HFpEF) heart failure with preserved ejection fraction (HCC) 08/02/2019   Pacemaker 07/19/2019   CKD (chronic kidney disease) 01/30/2019   Lumbar spinal stenosis 01/10/2018   Osteoarthritis of both knees 12/15/2017   Epigastric pain 09/09/2017   Neuralgia 09/09/2017  Claudication of calf muscles (HCC) 11/22/2016   DOE (dyspnea on exertion) 11/22/2016   Hyperreflexia of lower extremity 07/16/2016   Bilateral leg paresthesia 07/16/2016   Carotid arterial disease ([place]) 07/13/2016   Poor balance 06/23/2016   Weakness of both lower extremities 06/23/2016   Sinus node dysfunction (HCC) 05/05/2016   Right shoulder pain 03/14/2016   Multinodular goiter (nontoxic) 02/21/2016   Neck pain 02/15/2016   Prediabetes 12/17/2015   Dysphagia 12/17/2015   T8 vertebral fracture ([place]) 04/09/2015    Overweight 10/09/2011   Allergic rhinitis, cause unspecified    GERD (gastroesophageal reflux disease) 02/05/2011   Vitamin B 12 deficiency 04/17/2009   Hyperlipidemia 04/17/2009   Osteoporosis 04/17/2009   ADENOCARCINOMA, BREAST, HX OF 04/17/2009   CONSTIPATION 03/08/2009   COLONIC POLYPS, HYPERPLASTIC, HX OF 03/06/2009    Goals Addressed   None       BP Readings from Last 3 Encounters:  07/30/20 138/68  07/18/20 140/60  04/17/20 (!) 159/60    Lab Results  Component Value Date   HGBA1C 5.9 (H) 02/03/2020     Patient obtains medications through V[facility]  90 Days   Last adherence delivery included (date:07/18/20): (medication name and frequency)  Furosemide 40 mg 1 tab breakfast Potassium chloride ER 10 meq 2 tabs breakfast   Patient is due for next adherence delivery on: 10/17/20 Called patient and reviewed medication needs.  Patient declined need for refills at this time. Patient is aware of how to contact pharmacist if refills are needed prior to next adherence delivery.   Wendy Poet, [place] [phone]

## 2020-08-03 NOTE — Telephone Encounter (Addendum)
Attempted to tier exception for ezetimibe given patient's inability to take statins (she was hospitalized due to atorvastatin and it was decided not to rechallenge with other statins).  Per [employer] representative, patient must meet her deductible before a tier exception can be applied. Once deductible is met we will attempt a tier exception.   19 minutes spent in review, coordination, and documentation.  [place], [facility] 08/03/20 2:05 PM

## 2020-08-07 NOTE — Progress Notes (Deleted)
Assessment/Plan:   ***  Subjective:   [name] was seen today in the movement disorders clinic for neurologic consultation at the request of Burns, Claudina Lick, MD.  The consultation is for the evaluation of tremor.  Outside records that were made available to me were reviewed.  Pt seen years ago by Dr. Posey Pronto for hyperreflexia and neck pain.  Those records are reviewed.  More recently she was seen by PCP for acute, episodic hand tremor x 2-3 months.  Tremor: {yes no:314532}   How long has it been going on? ***  At rest or with activation?  ***  When is it noted the most?  ***  Fam hx of tremor?  {yes ZO:109604}  Located where?  ***  Affected by caffeine:  {yes no:314532}  Affected by alcohol:  {yes no:314532}  Affected by stress:  {yes no:314532}  Affected by fatigue:  {yes no:314532}  Spills soup if on spoon:  {yes no:314532}  Spills glass of liquid if full:  {yes no:314532}  Affects ADL's (tying shoes, brushing teeth, etc):  {yes no:314532}  Tremor inducing meds:  No.  Other Specific Symptoms:  Voice: *** Sleep: ***  Vivid Dreams:  {yes no:314532}  Acting out dreams:  {yes no:314532} Wet Pillows: {yes no:314532} Postural symptoms:  {yes no:314532}  Falls?  {yes no:314532} Bradykinesia symptoms: {parkinson brady:18041} Loss of smell:  {yes no:314532} Loss of taste:  {yes no:314532} Urinary Incontinence:  {yes no:314532} Difficulty Swallowing:  {yes no:314532} Handwriting, micrographia: {yes no:314532} Trouble with ADL's:  {yes no:314532}  Trouble buttoning clothing: {yes no:314532} Depression:  {yes no:314532} Memory changes:  {yes no:314532} Hallucinations:  {yes no:314532}  visual distortions: {yes no:314532} N/V:  {yes no:314532} Lightheaded:  {yes no:314532}  Syncope: {yes no:314532} Diplopia:  {yes no:314532} Dyskinesia:  {yes no:314532}  Neuroimaging of the brain has  previously been performed. MRI brain was done in 2017 and was normal. I personally  reviewed that.  PREVIOUS MEDICATIONS: {Parkinson's RX:18200}  ALLERGIES:   Allergies  Allergen Reactions  . Statins     Lipitor caused hospitalization - - depletion of electrolytes, fever, nausea, loss of appetite, couldn't get out of bed  . Cymbalta [Duloxetine Hcl] Other (See Comments)    Dulled her too much, difficulty urinating, change in vision  . Fosamax [Alendronate Sodium] Other (See Comments)    Caused chronic issues swallowing  . Amitriptyline     hallucinations  . Lyrica [Pregabalin]     Unknown  . Neurontin [Gabapentin] Other (See Comments)    Dizziness and sedation (patient is tolerating in lower dose)  . Zanaflex [Tizanidine] Other (See Comments)    Could not sleep    CURRENT MEDICATIONS:  Current Outpatient Medications  Medication Instructions  . acetaminophen (TYLENOL) 500 mg, Oral, 2 times daily  . cholecalciferol (VITAMIN D) 2,000 Units, Oral, Daily  . Cyanocobalamin 1,000 mcg, Oral, Daily  . denosumab (PROLIA) 60 mg, Subcutaneous, Every 6 months  . Diclofenac Sodium (PENNSAID) 2 % SOLN Apply externally  . diclofenac Sodium (VOLTAREN) 2 g, Topical, 4 times daily  . dicyclomine (BENTYL) 10 mg, Oral, 3 times daily before meals  . escitalopram (LEXAPRO) 5 mg, Oral, Daily  . ezetimibe (ZETIA) 10 mg, Oral, Daily  . ferrous sulfate 325 mg, Oral, 3 times weekly  . furosemide (LASIX) 40 mg, Oral, Daily  . loratadine (CLARITIN) 10 mg, Oral, Daily PRN  . metoprolol succinate (TOPROL XL) 25 mg, Oral, Daily  . omeprazole (PRILOSEC) 40 MG capsule TAKE ONE CAPSULE BY  MOUTH TWICE A DAY  . Polyethyl Glycol-Propyl Glycol (LUBRICANT EYE DROPS) 0.4-0.3 % SOLN 1 drop, Both Eyes, 3 times daily PRN  . polyethylene glycol (MIRALAX / GLYCOLAX) 17 g, Oral, Daily PRN  . potassium chloride (KLOR-CON) 10 MEQ tablet 20 mEq, Oral, Daily  . sodium chloride (OCEAN) 0.65 % SOLN nasal spray 1 spray, Each Nare, 4 times daily PRN    Objective:   PHYSICAL EXAMINATION:    VITALS:  There  were no vitals filed for this visit.  GEN:  The patient appears stated age and is in NAD. HEENT:  Normocephalic, atraumatic.  The mucous membranes are moist. The superficial temporal arteries are without ropiness or tenderness. CV:  RRR Lungs:  CTAB Neck/HEME:  There are no carotid bruits bilaterally.  Neurological examination:  Orientation: The patient is alert and oriented x3.  Cranial nerves: There is good facial symmetry.  Extraocular muscles are intact. The visual fields are full to confrontational testing. The speech is fluent and clear. Soft palate rises symmetrically and there is no tongue deviation. Hearing is intact to conversational tone. Sensation: Sensation is intact to light touch throughout (facial, trunk, extremities). Vibration is intact at the bilateral big toe. There is no extinction with double simultaneous stimulation.  Motor: Strength is 5/5 in the bilateral upper and lower extremities.   Shoulder shrug is equal and symmetric.  There is no pronator drift. Deep tendon reflexes: Deep tendon reflexes are 2/4 at the bilateral biceps, triceps, brachioradialis, patella and achilles. Plantar responses are downgoing bilaterally.  Movement examination: Tone: There is ***tone in the bilateral upper extremities.  The tone in the lower extremities is ***.  Abnormal movements: *** Coordination:  There is *** decremation with RAM's, *** Gait and Station: The patient has *** difficulty arising out of a deep-seated chair without the use of the hands. The patient's stride length is ***.  The patient has a *** pull test.     I have reviewed and interpreted the following labs independently   Chemistry      Component Value Date/Time   NA 137 03/30/2020 1011   NA 136 10/05/2019 1053   K 4.9 03/30/2020 1011   CL 103 03/30/2020 1011   CO2 25 03/30/2020 1011   BUN 22 03/30/2020 1011   BUN 18 10/05/2019 1053   CREATININE 1.23 (H) 03/30/2020 1011      Component Value Date/Time    CALCIUM 9.4 03/30/2020 1011   ALKPHOS 35 (L) 12/27/2019 0810   AST 11 03/30/2020 1011   ALT 8 03/30/2020 1011   BILITOT 0.4 03/30/2020 1011      Lab Results  Component Value Date   TSH 1.85 02/03/2020   Lab Results  Component Value Date   WBC 7.8 03/30/2020   HGB 12.0 03/30/2020   HCT 36.3 03/30/2020   MCV 98.1 03/30/2020   PLT 333 03/30/2020      Total time spent on today's visit was ***greater than 60 minutes, including both face-to-face time and nonface-to-face time.  Time included that spent on review of records (prior notes available to me/labs/imaging if pertinent), discussing treatment and goals, answering patient's questions and coordinating care.  Cc:  [name], MD

## 2020-08-08 ENCOUNTER — Telehealth: Payer: Self-pay | Admitting: Pharmacist

## 2020-08-08 NOTE — Progress Notes (Signed)
Chronic Care Management Pharmacy Assistant   Name: [name]  MRN: [id number] DOB: 07-02-1935  Reason for Encounter: Medication Review   PCP : [name], MD  Allergies:   Allergies  Allergen Reactions  . Statins     Lipitor caused hospitalization - - depletion of electrolytes, fever, nausea, loss of appetite, couldn't get out of bed  . Cymbalta [Duloxetine Hcl] Other (See Comments)    Dulled her too much, difficulty urinating, change in vision  . Fosamax [Alendronate Sodium] Other (See Comments)    Caused chronic issues swallowing  . Amitriptyline     hallucinations  . Lyrica [Pregabalin]     Unknown  . Neurontin [Gabapentin] Other (See Comments)    Dizziness and sedation (patient is tolerating in lower dose)  . Zanaflex [Tizanidine] Other (See Comments)    Could not sleep    Medications: Outpatient Encounter Medications as of 08/08/2020  Medication Sig  . acetaminophen (TYLENOL) 500 MG tablet Take 500 mg by mouth 2 (two) times daily.  . cholecalciferol (VITAMIN D) 1000 UNITS tablet Take 2,000 Units by mouth daily.   . Cyanocobalamin 1000 MCG TBCR Take 1,000 mcg by mouth daily.   [name] denosumab (PROLIA) 60 MG/ML SOSY injection Inject 60 mg into the skin every 6 (six) months.  . Diclofenac Sodium (PENNSAID) 2 % SOLN Apply topically.  . diclofenac Sodium (VOLTAREN) 1 % GEL Apply 2 g topically 4 (four) times daily.  [name] dicyclomine (BENTYL) 10 MG capsule Take 1 capsule (10 mg total) by mouth 3 (three) times daily before meals.  [name] escitalopram (LEXAPRO) 5 MG tablet Take 1 tablet (5 mg total) by mouth daily.  [name] ezetimibe (ZETIA) 10 MG tablet Take 1 tablet (10 mg total) by mouth daily.  . ferrous sulfate 325 (65 FE) MG tablet Take 325 mg by mouth 3 (three) times a week.  . furosemide (LASIX) 40 MG tablet Take 1 tablet (40 mg total) by mouth daily.  [name] loratadine (CLARITIN) 10 MG tablet Take 10 mg by mouth daily as needed for allergies.  . metoprolol succinate (TOPROL  XL) 25 MG 24 hr tablet Take 1 tablet (25 mg total) by mouth daily.  [name] omeprazole (PRILOSEC) 40 MG capsule TAKE ONE CAPSULE BY MOUTH TWICE A DAY  . Polyethyl Glycol-Propyl Glycol (LUBRICANT EYE DROPS) 0.4-0.3 % SOLN Place 1 drop into both eyes 3 (three) times daily as needed (dry/irritated eyes.).  [name] polyethylene glycol (MIRALAX / GLYCOLAX) packet Take 17 g by mouth daily as needed (constipation).  . potassium chloride (KLOR-CON) 10 MEQ tablet Take 2 tablets (20 mEq total) by mouth daily.  . sodium chloride (OCEAN) 0.65 % SOLN nasal spray Place 1 spray into both nostrils 4 (four) times daily as needed for congestion.   No facility-administered encounter medications on file as of 08/08/2020.    Current Diagnosis: Patient Active Problem List   Diagnosis Date Noted  . Tremor of both hands 07/30/2020  . Left arm swelling 04/10/2020  . Aortic atherosclerosis ([place]) 03/30/2020  . Cellulitis of arm, left 03/30/2020  . Chronic thoracic back pain 02/03/2020  . Iron deficiency anemia 02/02/2020  . Near syncope 09/27/2019  . Hypertension 09/20/2019  . Acute pain of right knee 08/03/2019  . (HFpEF) heart failure with preserved ejection fraction ([place]) 08/02/2019  . Pacemaker 07/19/2019  . CKD (chronic kidney disease) 01/30/2019  . Lumbar spinal stenosis 01/10/2018  . Osteoarthritis of both knees 12/15/2017  . Epigastric pain 09/09/2017  . Neuralgia 09/09/2017  .  Claudication of calf muscles ([place]) 11/22/2016  . DOE (dyspnea on exertion) 11/22/2016  . Hyperreflexia of lower extremity 07/16/2016  . Bilateral leg paresthesia 07/16/2016  . Carotid arterial disease ([place]) 07/13/2016  . Poor balance 06/23/2016  . Weakness of both lower extremities 06/23/2016  . Sinus node dysfunction ([place]) 05/05/2016  . Right shoulder pain 03/14/2016  . Multinodular goiter (nontoxic) 02/21/2016  . Neck pain 02/15/2016  . Prediabetes 12/17/2015  . Dysphagia 12/17/2015  . T8 vertebral fracture ([place]) 04/09/2015  .  Overweight 10/09/2011  . Allergic rhinitis, cause unspecified   . GERD (gastroesophageal reflux disease) 02/05/2011  . Vitamin B 12 deficiency 04/17/2009  . Hyperlipidemia 04/17/2009  . Osteoporosis 04/17/2009  . ADENOCARCINOMA, BREAST, HX OF 04/17/2009  . CONSTIPATION 03/08/2009  . COLONIC POLYPS, HYPERPLASTIC, HX OF 03/06/2009    Goals Addressed   None     Follow-Up:  Pharmacist Review    Received call from patient regarding medication management via [facility].  Patient requested an acute fill for Omeprazole 40mg , Escitalopram 5mg    to be delivered: 08/09/20 Pharmacy needs refills? No  Confirmed delivery date of 08/09/2020, advised patient that pharmacy will contact them the morning of delivery.  Rosendo Gros, [facility] ([facility])  Practice Team Manager/ CPA (Clinical Pharmacist Assistant) [phone]

## 2020-08-13 ENCOUNTER — Telehealth: Payer: Self-pay | Admitting: *Deleted

## 2020-08-13 ENCOUNTER — Ambulatory Visit: Payer: Medicare Other | Admitting: Neurology

## 2020-08-13 NOTE — Telephone Encounter (Signed)
  Patient Consent for Virtual Visit         [name] has provided verbal consent on 08/13/2020 for a virtual visit (video or telephone).   CONSENT FOR VIRTUAL VISIT FOR:  [name]  By participating in this virtual visit I agree to the following:  I hereby voluntarily request, consent and authorize [facility] and its employed or contracted physicians, physician assistants, nurse practitioners or other licensed health care professionals (the Practitioner), to provide me with telemedicine health care services (the "Services") as deemed necessary by the treating Practitioner. I acknowledge and consent to receive the Services by the Practitioner via telemedicine. I understand that the telemedicine visit will involve communicating with the Practitioner through live audiovisual communication technology and the disclosure of certain medical information by electronic transmission. I acknowledge that I have been given the opportunity to request an in-person assessment or other available alternative prior to the telemedicine visit and am voluntarily participating in the telemedicine visit.  I understand that I have the right to withhold or withdraw my consent to the use of telemedicine in the course of my care at any time, without affecting my right to future care or treatment, and that the Practitioner or I may terminate the telemedicine visit at any time. I understand that I have the right to inspect all information obtained and/or recorded in the course of the telemedicine visit and may receive copies of available information for a reasonable fee.  I understand that some of the potential risks of receiving the Services via telemedicine include:  [name] Delay or interruption in medical evaluation due to technological equipment failure or disruption; . Information transmitted may not be sufficient (e.g. poor resolution of images) to allow for appropriate medical decision making by the  Practitioner; and/or  . In rare instances, security protocols could fail, causing a breach of personal health information.  Furthermore, I acknowledge that it is my responsibility to provide information about my medical history, conditions and care that is complete and accurate to the best of my ability. I acknowledge that Practitioner's advice, recommendations, and/or decision may be based on factors not within their control, such as incomplete or inaccurate data provided by me or distortions of diagnostic images or specimens that may result from electronic transmissions. I understand that the practice of medicine is not an exact science and that Practitioner makes no warranties or guarantees regarding treatment outcomes. I acknowledge that a copy of this consent can be made available to me via my patient portal ([facility]), or I can request a printed copy by calling the office of [facility].    I understand that my insurance will be billed for this visit.   I have read or had this consent read to me. . I understand the contents of this consent, which adequately explains the benefits and risks of the Services being provided via telemedicine.  . I have been provided ample opportunity to ask questions regarding this consent and the Services and have had my questions answered to my satisfaction. . I give my informed consent for the services to be provided through the use of telemedicine in my medical care

## 2020-08-14 ENCOUNTER — Other Ambulatory Visit: Payer: Self-pay

## 2020-08-14 ENCOUNTER — Telehealth (INDEPENDENT_AMBULATORY_CARE_PROVIDER_SITE_OTHER): Payer: Medicare Other | Admitting: Internal Medicine

## 2020-08-14 ENCOUNTER — Ambulatory Visit (INDEPENDENT_AMBULATORY_CARE_PROVIDER_SITE_OTHER): Payer: Medicare Other | Admitting: Otolaryngology

## 2020-08-14 ENCOUNTER — Encounter (INDEPENDENT_AMBULATORY_CARE_PROVIDER_SITE_OTHER): Payer: Self-pay | Admitting: Otolaryngology

## 2020-08-14 VITALS — Temp 96.8°F

## 2020-08-14 DIAGNOSIS — R490 Dysphonia: Secondary | ICD-10-CM | POA: Diagnosis not present

## 2020-08-14 DIAGNOSIS — R1312 Dysphagia, oropharyngeal phase: Secondary | ICD-10-CM

## 2020-08-14 DIAGNOSIS — I5030 Unspecified diastolic (congestive) heart failure: Secondary | ICD-10-CM

## 2020-08-14 DIAGNOSIS — K219 Gastro-esophageal reflux disease without esophagitis: Secondary | ICD-10-CM

## 2020-08-14 NOTE — Progress Notes (Signed)
HPI: [name] is a 86 y.o. female who presents is referred by Dr. Fuller Plan for evaluation of swallowing difficulty and is intermittent as well as hoarseness.  She complains of postnasal drainage.  She states that she gets choked frequently and is not sure what causes her choking.  This has been going on for years.  She also complains of hoarseness that she feels like is getting worse.  She states that her weight has been stable. She has a history of a hiatal hernia and a long history of GE reflux disease for which she takes omeprazole twice daily. She has never smoked. She does have history of previous cervical vertebrae fusion with titanium plate.. She has undergone modified barium swallow with speech therapy.  She was noted to have some difficulty with oropharyngeal swallowing but no significant aspiration. On discussion with the patient in the office today she has no significant hoarseness.  Past Medical History:  Diagnosis Date  . Allergy   . Anemia   . Cancer of left breast ([place]) 1978  . CHF (congestive heart failure) ([place])   . Chronic thoracic back pain    "T8; fracture; 03/2015; no OR" (05/05/2016)  . GERD (gastroesophageal reflux disease)   . Heart murmur   . History of hiatal hernia   . Hyperlipidemia   . Hyperplastic colon polyp   . Hypertension   . IBS (irritable bowel syndrome)   . Multiple thyroid nodules   . Osteoporosis    T8 compression fx 03/2015   . Personal history of radiation therapy   . Presence of permanent cardiac pacemaker   . Vitamin B12 deficiency    Past Surgical History:  Procedure Laterality Date  . ANTERIOR CERVICAL DECOMP/DISCECTOMY FUSION  2008   C5-6  . AUGMENTATION MAMMAPLASTY    . BACK SURGERY    . BREAST SURGERY    . CARDIAC CATHETERIZATION  2001  . EP IMPLANTABLE DEVICE N/A 05/05/2016   Procedure: Pacemaker Implant;  Surgeon: Evans Lance, MD;  Location: [facility];  Service: Cardiovascular;  Laterality: N/A;  . EXCISIONAL  HEMORRHOIDECTOMY  1984   With subsequent correction of surgery  . INSERT / REPLACE / REMOVE PACEMAKER    . INTRAVASCULAR PRESSURE WIRE/FFR STUDY N/A 10/07/2019   Procedure: INTRAVASCULAR PRESSURE WIRE/FFR STUDY;  Surgeon: Jettie Booze, MD;  Location: [facility];  Service: Cardiovascular;  Laterality: N/A;  . KNEE ARTHROSCOPY Left    "meniscus tear"  . LEFT HEART CATH AND CORONARY ANGIOGRAPHY N/A 10/07/2019   Procedure: LEFT HEART CATH AND CORONARY ANGIOGRAPHY;  Surgeon: Jettie Booze, MD;  Location: [facility];  Service: Cardiovascular;  Laterality: N/A;  . MASTECTOMY Left 1978  . PLACEMENT OF BREAST IMPLANTS Left 1981  . REDUCTION MAMMAPLASTY Right   . SHOULDER ARTHROSCOPY W/ ROTATOR CUFF REPAIR Left 02/2006   "tear"  . TUBAL LIGATION     Social History   Socioeconomic History  . Marital status: Married    Spouse name: Not on file  . Number of children: 2  . Years of education: Not on file  . Highest education level: Not on file  Occupational History  . Occupation: retired  Tobacco Use  . Smoking status: Never Smoker  . Smokeless tobacco: Never Used  Vaping Use  . Vaping Use: Never used  Substance and Sexual Activity  . Alcohol use: No    Alcohol/week: 0.0 standard drinks  . Drug use: No  . Sexual activity: Not Currently  Other Topics Concern  . Not on file  Social History Narrative   Housewife, Lives with spouse, 2 children   Social Determinants of Health   Financial Resource Strain: Medium Risk  . Difficulty of Paying Living Expenses: Somewhat hard  Food Insecurity: Not on file  Transportation Needs: Not on file  Physical Activity: Not on file  Stress: Not on file  Social Connections: Not on file   Family History  Problem Relation Age of Onset  . Diabetes Sister   . Diabetes Brother   . Breast cancer Maternal Aunt   . Colon cancer Neg Hx   . Stomach cancer Neg Hx    Allergies  Allergen Reactions  . Statins     Lipitor  caused hospitalization - - depletion of electrolytes, fever, nausea, loss of appetite, couldn't get out of bed  . Cymbalta [Duloxetine Hcl] Other (See Comments)    Dulled her too much, difficulty urinating, change in vision  . Fosamax [Alendronate Sodium] Other (See Comments)    Caused chronic issues swallowing  . Amitriptyline     hallucinations  . Lyrica [Pregabalin]     Unknown  . Neurontin [Gabapentin] Other (See Comments)    Dizziness and sedation (patient is tolerating in lower dose)  . Zanaflex [Tizanidine] Other (See Comments)    Could not sleep   Prior to Admission medications   Medication Sig Start Date End Date Taking? Authorizing Provider  acetaminophen (TYLENOL) 500 MG tablet Take 500 mg by mouth 2 (two) times daily.    [provider]  cholecalciferol (VITAMIN D) 1000 UNITS tablet Take 2,000 Units by mouth daily.     [provider]  Cyanocobalamin 1000 MCG TBCR Take 1,000 mcg by mouth daily.     [provider]  denosumab (PROLIA) 60 MG/ML SOSY injection Inject 60 mg into the skin every 6 (six) months.    [provider]  Diclofenac Sodium (PENNSAID) 2 % SOLN Apply topically.    [provider]  diclofenac Sodium (VOLTAREN) 1 % GEL Apply 2 g topically 4 (four) times daily.    [provider]  dicyclomine (BENTYL) 10 MG capsule Take 1 capsule (10 mg total) by mouth 3 (three) times daily before meals. 07/18/20   Ladene Artist, MD  escitalopram (LEXAPRO) 5 MG tablet Take 1 tablet (5 mg total) by mouth daily. 07/30/20   [name], MD  ezetimibe (ZETIA) 10 MG tablet Take 1 tablet (10 mg total) by mouth daily. 03/30/20   [name], MD  ferrous sulfate 325 (65 FE) MG tablet Take 325 mg by mouth 3 (three) times a week.    [provider]  furosemide (LASIX) 40 MG tablet Take 1 tablet (40 mg total) by mouth daily. 07/18/20   Evans Lance, MD  loratadine (CLARITIN) 10 MG tablet Take 10 mg by mouth daily as  needed for allergies.    [provider]  metoprolol succinate (TOPROL XL) 25 MG 24 hr tablet Take 1 tablet (25 mg total) by mouth daily. 06/06/20   Evans Lance, MD  omeprazole (PRILOSEC) 40 MG capsule TAKE ONE CAPSULE BY MOUTH TWICE A DAY 03/30/20   Ladene Artist, MD  Polyethyl Glycol-Propyl Glycol (LUBRICANT EYE DROPS) 0.4-0.3 % SOLN Place 1 drop into both eyes 3 (three) times daily as needed (dry/irritated eyes.).    [provider]  polyethylene glycol (MIRALAX / GLYCOLAX) packet Take 17 g by mouth daily as needed (constipation).    [provider]  potassium chloride (KLOR-CON) 10 MEQ tablet Take 2 tablets (20 mEq total) by mouth daily. 04/19/20 07/18/20  Evans Lance, MD  sodium chloride (OCEAN) 0.65 % SOLN nasal spray Place 1 spray into both nostrils 4 (four) times daily as needed for congestion.    [provider]     Positive ROS: Otherwise negative  All other systems have been reviewed and were otherwise negative with the exception of those mentioned in the HPI and as above.  Physical Exam: Constitutional: Alert, well-appearing, no acute distress Ears: External ears without lesions or tenderness. Ear canals are clear bilaterally with intact, clear TMs.  Nasal: External nose without lesions. Septum mildly deviated to the left.  Clear nasal passages otherwise.  Both middle meatus regions were clear with no significant drainage noted.  Mucus within the nasal cavity is clear..  No polyps and no signs of infection. Oral: Lips and gums without lesions. Tongue and palate mucosa without lesions. Posterior oropharynx clear.  Tonsils are average size and symmetric bilaterally. Fiberoptic laryngoscopy was performed through the right nostril.  The nasopharynx was clear as was the nasal cavity.  The base of tongue vallecula and epiglottis were normal.  The vocal cords were clear bilaterally with normal vocal mobility.  She had mild edema of the arytenoid  mucosa but no mucosal abnormalities noted.  The fiberoptic laryngoscope was easily passed through the upper esophageal sphincter and the upper cervical esophagus was clear on swallowing. Neck: No palpable adenopathy or masses.  No palpable thyroid nodules or supraclavicular adenopathy noted. Respiratory: Breathing comfortably  Skin: No facial/neck lesions or rash noted.  Laryngoscopy  Date/Time: 08/14/2020 3:07 PM Performed by: Rozetta Nunnery, MD Authorized by: Rozetta Nunnery, MD   Consent:    Consent obtained:  Verbal   Consent given by:  Patient Procedure details:    Indications: hoarseness, dysphagia, or aspiration     Medication:  Afrin   Instrument: flexible fiberoptic laryngoscope     Scope location: right nare   Sinus:    Right middle meatus: normal     Left middle meatus: normal     Right nasopharynx: normal     Left nasopharynx: normal   Mouth:    Oropharynx: normal     Vallecula: normal     Base of tongue: normal     Epiglottis: normal   Throat:    Pyriform sinus: normal     True vocal cords: normal   Comments:     On fiberoptic laryngoscopy the nasal cavity was clear bilaterally.  The base of tongue vallecula and epiglottis were normal.  Vocal cords were clear bilaterally with normal vocal mobility.  A fiberoptic laryngoscope was passed through the upper esophageal sphincter without difficulty in the upper cervical esophagus was clear.    Assessment: History of hoarseness and GE reflux disease with normal examination of the vocal cords. Dysphagia.  Plan: Concerning her dysphagia would recommend further evaluation and treatment with speech therapy to help with swallowing.  There is no obvious structural abnormality on examination of the hypopharynx and larynx.   Radene Journey, MD   CC:

## 2020-08-14 NOTE — Progress Notes (Signed)
Remote pacemaker transmission.   

## 2020-08-14 NOTE — Progress Notes (Addendum)
Electrophysiology TeleHealth Note   Due to national recommendations of social distancing due to COVID 19, an audio/video telehealth visit is felt to be most appropriate for this patient at this time.  See MyChart message from today for the patient's consent to telehealth for [facility].   Date:  08/14/2020   ID:  [name], DOB Jan 24, 1935, MRN [id number]  Location: patient's home  Provider location: [address] [place]  Evaluation Performed: Follow-up visit  PCP:  [name], MD  Cardiologist:  Cristopher Peru, MD  Electrophysiologist:  Dr Lovena Le  Chief Complaint: "I am still so short of breath and I have a tremor."  History of Present Illness:    [name] is a 86 y.o. female who presents via audio/video conferencing for a telehealth visit today.  She has a h/o HFPEF, CAD with an occluded apical LAD, a h/o breast CA, sinus node dysfunction s/p PPM insertion. She has c/o worsening dyspnea but does admit to dietary indiscretion. She notes that her tremor has worsened and has an appointment with a neurologist. She denies anginal symptoms. She has class 2B CHF symptoms.  Past Medical History:  Diagnosis Date  . Allergy   . Anemia   . Cancer of left breast ([place]) 1978  . CHF (congestive heart failure) ([place])   . Chronic thoracic back pain    "T8; fracture; 03/2015; no OR" (05/05/2016)  . GERD (gastroesophageal reflux disease)   . Heart murmur   . History of hiatal hernia   . Hyperlipidemia   . Hyperplastic colon polyp   . Hypertension   . IBS (irritable bowel syndrome)   . Multiple thyroid nodules   . Osteoporosis    T8 compression fx 03/2015   . Personal history of radiation therapy   . Presence of permanent cardiac pacemaker   . Vitamin B12 deficiency     Past Surgical History:  Procedure Laterality Date  . ANTERIOR CERVICAL DECOMP/DISCECTOMY FUSION  2008   C5-6  . AUGMENTATION MAMMAPLASTY    . BACK SURGERY    . BREAST SURGERY     . CARDIAC CATHETERIZATION  2001  . EP IMPLANTABLE DEVICE N/A 05/05/2016   Procedure: Pacemaker Implant;  Surgeon: Evans Lance, MD;  Location: [facility];  Service: Cardiovascular;  Laterality: N/A;  . EXCISIONAL HEMORRHOIDECTOMY  1984   With subsequent correction of surgery  . INSERT / REPLACE / REMOVE PACEMAKER    . INTRAVASCULAR PRESSURE WIRE/FFR STUDY N/A 10/07/2019   Procedure: INTRAVASCULAR PRESSURE WIRE/FFR STUDY;  Surgeon: Jettie Booze, MD;  Location: [facility];  Service: Cardiovascular;  Laterality: N/A;  . KNEE ARTHROSCOPY Left    "meniscus tear"  . LEFT HEART CATH AND CORONARY ANGIOGRAPHY N/A 10/07/2019   Procedure: LEFT HEART CATH AND CORONARY ANGIOGRAPHY;  Surgeon: Jettie Booze, MD;  Location: [facility];  Service: Cardiovascular;  Laterality: N/A;  . MASTECTOMY Left 1978  . PLACEMENT OF BREAST IMPLANTS Left 1981  . REDUCTION MAMMAPLASTY Right   . SHOULDER ARTHROSCOPY W/ ROTATOR CUFF REPAIR Left 02/2006   "tear"  . TUBAL LIGATION      Current Outpatient Medications  Medication Sig Dispense Refill  . acetaminophen (TYLENOL) 500 MG tablet Take 500 mg by mouth 2 (two) times daily.    . cholecalciferol (VITAMIN D) 1000 UNITS tablet Take 2,000 Units by mouth daily.     . Cyanocobalamin 1000 MCG TBCR Take 1,000 mcg by mouth daily.     [name]  denosumab (PROLIA) 60 MG/ML SOSY injection Inject 60 mg into the skin every 6 (six) months.    . Diclofenac Sodium (PENNSAID) 2 % SOLN Apply topically.    . diclofenac Sodium (VOLTAREN) 1 % GEL Apply 2 g topically 4 (four) times daily.    [name] dicyclomine (BENTYL) 10 MG capsule Take 1 capsule (10 mg total) by mouth 3 (three) times daily before meals. 90 capsule 11  . escitalopram (LEXAPRO) 5 MG tablet Take 1 tablet (5 mg total) by mouth daily. 30 tablet 5  . ezetimibe (ZETIA) 10 MG tablet Take 1 tablet (10 mg total) by mouth daily. 90 tablet 3  . ferrous sulfate 325 (65 FE) MG tablet Take 325 mg by mouth 3  (three) times a week.    . furosemide (LASIX) 40 MG tablet Take 1 tablet (40 mg total) by mouth daily. 90 tablet 2  . loratadine (CLARITIN) 10 MG tablet Take 10 mg by mouth daily as needed for allergies.    . metoprolol succinate (TOPROL XL) 25 MG 24 hr tablet Take 1 tablet (25 mg total) by mouth daily. 90 tablet 2  . omeprazole (PRILOSEC) 40 MG capsule TAKE ONE CAPSULE BY MOUTH TWICE A DAY 60 capsule 11  . Polyethyl Glycol-Propyl Glycol (LUBRICANT EYE DROPS) 0.4-0.3 % SOLN Place 1 drop into both eyes 3 (three) times daily as needed (dry/irritated eyes.).    [name] polyethylene glycol (MIRALAX / GLYCOLAX) packet Take 17 g by mouth daily as needed (constipation).    . sodium chloride (OCEAN) 0.65 % SOLN nasal spray Place 1 spray into both nostrils 4 (four) times daily as needed for congestion.    . potassium chloride (KLOR-CON) 10 MEQ tablet Take 2 tablets (20 mEq total) by mouth daily. 180 tablet 3   No current facility-administered medications for this visit.    Allergies:   Statins, Cymbalta [duloxetine hcl], Fosamax [alendronate sodium], Amitriptyline, Lyrica [pregabalin], Neurontin [gabapentin], and Zanaflex [tizanidine]   Social History:  The patient  reports that she has never smoked. She has never used smokeless tobacco. She reports that she does not drink alcohol and does not use drugs.   Family History:  The patient's  family history includes Breast cancer in her maternal aunt; Diabetes in her brother and sister.   ROS:  Please see the history of present illness.   All other systems are personally reviewed and negative.    Exam:    Vital Signs:  BP (!) 121/53   Pulse 62   Ht 5\' 1"  (1.549 m)   Wt 157 lb (71.2 kg)   BMI 29.66 kg/m   Well appearing, alert and conversant   Labs/Other Tests and Data Reviewed:    Recent Labs: 02/03/2020: TSH 1.85 03/30/2020: ALT 8; BUN 22; Creat 1.23; Hemoglobin 12.0; Platelets 333; Potassium 4.9; Sodium 137   Wt Readings from Last 3 Encounters:   08/14/20 157 lb (71.2 kg)  07/30/20 157 lb (71.2 kg)  07/18/20 160 lb 3.2 oz (72.7 kg)     Other studies personally reviewed: Additional studies/ records that were reviewed today include:  Last device remote is reviewed from [facility] PDF dated 1/22 which reveals normal device function, no arrhythmias except PAF   ASSESSMENT & PLAN:    1.  Dyspnea with exertion - encouraged her to reduce her salt intake and increase her lasix to 40/20 daily. She will return in 2 weeks for repeat labs. I will see her back in several months. 2. Sinus node dysfunction - she is  s/p PPM and asymptomatic. 3. PPM - her device has not been checked in 6 months and will need to be checked either remotely or in person. 4. PAF - she will have her device checked for burden. She will continue her current meds.  Carleene Overlie Taylor,MD   COVID 19 screen The patient denies symptoms of COVID 19 at this time.  The importance of social distancing was discussed today.  Follow-up:  6 months Next remote: 4/22  Current medicines are reviewed at length with the patient today.   The patient does not have concerns regarding her medicines.  The following changes were made today:  none  Labs/ tests ordered today include: none No orders of the defined types were placed in this encounter.    Patient Risk:  after full review of this patients clinical status, I feel that they are at moderate risk at this time.  Today, I have spent 23 minutes with the patient with telehealth technology discussing all of the above .    Signed, Cristopher Peru, MD  08/14/2020 1:52 PM     [address] [place] [address] [phone] (office) [phone] (fax)

## 2020-08-15 ENCOUNTER — Telehealth: Payer: Self-pay

## 2020-08-15 DIAGNOSIS — I5030 Unspecified diastolic (congestive) heart failure: Secondary | ICD-10-CM

## 2020-08-16 MED ORDER — FUROSEMIDE 40 MG PO TABS: ORAL_TABLET | ORAL | 3 refills | Status: DC

## 2020-08-16 NOTE — Telephone Encounter (Signed)
Returned call to Pt.  Advised Pt she would need a BMP in 2 weeks after increasing her lasix.  Pt's husband has an appt on August 30, 2020, Pt will get lab work same day.  Sent new prescription to pharmacy.

## 2020-08-16 NOTE — Telephone Encounter (Signed)
Pt called in and has a couple of questions about the increase of the lasix .  She thought she was suppose to come in to recheck her potassium.    Best best number [phone]

## 2020-08-21 NOTE — Progress Notes (Signed)
Assessment/Plan:   1. Parkinsonism  -Differential diagnosis is between idiopathic PD and atypical state, but likely has idiopathic PD.  -We discussed the diagnosis as well as pathophysiology of the disease.  We discussed treatment options as well as prognostic indicators.  Patient education was provided.  -We discussed that it used to be thought that levodopa would increase risk of melanoma but now it is believed that Parkinsons itself likely increases risk of melanoma. she is to get regular skin checks. She sees Dr. Danielle Dess and Dr. Renda Rolls. Is currently in the process of having basal cell carcinoma removed.  -We decided to add carbidopa/levodopa 25/100.  1/2 tab tid x 1 wk, then 1/2 in am & noon & 1 at night for a week, then 1/2 in am &1 at noon &night for a week, then 1 po tid.  Risks, benefits, side effects and alternative therapies were discussed.  The opportunity to ask questions was given and they were answered to the best of my ability.  The patient expressed understanding and willingness to follow the outlined treatment protocols.  -I will refer the patient for physical and speech therapy. She would prefer to have that done in her home. Prescriptions were given.  -We discussed community resources in the area including patient support groups and community exercise programs for PD and pt education was provided to the patient.  2. Dysphagia  -Has had work-up by gastroenterology and ENT. Modified barium swallow on March 27, 2020 demonstrating need for mechanical soft with thin liquids.   3.  RBD  -Patient fell out of the bed twice last year. Discussed association of RBD with alpha synucleinopathies.  She has bed rails.   Subjective:   [name] was seen today in the movement disorders clinic for neurologic consultation at the request of Burns, Claudina Lick, MD.  The consultation is for the evaluation of tremor.  Outside records that were made available to me were reviewed.  Pt seen  years ago by Dr. Posey Pronto for hyperreflexia and neck pain.  Those records are reviewed.  More recently she was seen by PCP for acute, episodic hand tremor x 2-3 months.  Tremor: Yes.     How long has it been going on? She states today that it started in about 02/2020  At rest or with activation?  Activation; describes some tremor on the inside and was put on lexapro and that helped some with inner tremor  Fam hx of tremor?  No.  Located where?  Bilateral UE - seemed to start after she fell out of bed in dream state in 02/2020  Affected by caffeine:  No.  Affected by alcohol:  Doesn't drink any  Affected by stress:  No.  Affected by fatigue:  Yes.    Spills soup if on spoon:  No.  Spills glass of liquid if full:  No.  Affects ADL's (tying shoes, brushing teeth, etc):  No.  Tremor inducing meds:  No.  Other Specific Symptoms:  Voice: hoarse all of the time - just recently saw ENT Sleep:   Vivid Dreams:  No., "not that I know of"  Acting out dreams:  Yes.  , fell out of bed two different times in august 2021 - now with bedrails Wet Pillows: No. Postural symptoms:  Yes.   - using stand up walker  Falls?  No.  Bradykinesia symptoms: slow movements and shorter steps but not shuffling Loss of smell:  No. Loss of taste:  No. Urinary Incontinence:  No. but has frequency due to lasix Difficulty Swallowing:  Yes.   Handwriting, micrographia: No. Depression:  No. but husband states "I miss her smile - she used to smile all of the time." Memory changes:  Yes.   , short term.  Remembers meds.   Hallucinations:  No.  visual distortions: No. Lightheaded:  No.  Syncope: No. Diplopia:  No. Dyskinesia:  No.  Neuroimaging of the brain has  previously been performed. MRI brain was done in 2017 and was normal. I personally reviewed that.   ALLERGIES:   Allergies  Allergen Reactions  . Statins     Lipitor caused hospitalization - - depletion of electrolytes, fever, nausea, loss of appetite,  couldn't get out of bed  . Cymbalta [Duloxetine Hcl] Other (See Comments)    Dulled her too much, difficulty urinating, change in vision  . Fosamax [Alendronate Sodium] Other (See Comments)    Caused chronic issues swallowing  . Amitriptyline     hallucinations  . Lyrica [Pregabalin]     Unknown  . Neurontin [Gabapentin] Other (See Comments)    Dizziness and sedation (patient is tolerating in lower dose)  . Zanaflex [Tizanidine] Other (See Comments)    Could not sleep    CURRENT MEDICATIONS:  Current Outpatient Medications  Medication Instructions  . acetaminophen (TYLENOL) 500 mg, Oral, 2 times daily  . cholecalciferol (VITAMIN D) 2,000 Units, Oral, Daily  . Cyanocobalamin 1,000 mcg, Oral, Daily  . denosumab (PROLIA) 60 mg, Subcutaneous, Every 6 months  . Diclofenac Sodium (PENNSAID) 2 % SOLN Apply externally  . diclofenac Sodium (VOLTAREN) 2 g, Topical, 4 times daily  . dicyclomine (BENTYL) 10 mg, Oral, 3 times daily before meals  . escitalopram (LEXAPRO) 5 mg, Oral, Daily  . ezetimibe (ZETIA) 10 mg, Oral, Daily  . ferrous sulfate 325 mg, Oral, 3 times weekly  . furosemide (LASIX) 40 MG tablet Take one tablet in the AM and 1/2 tablet after lunch.  . loratadine (CLARITIN) 10 mg, Oral, Daily PRN  . metoprolol succinate (TOPROL XL) 25 mg, Oral, Daily  . omeprazole (PRILOSEC) 40 MG capsule TAKE ONE CAPSULE BY MOUTH TWICE A DAY  . Polyethyl Glycol-Propyl Glycol (LUBRICANT EYE DROPS) 0.4-0.3 % SOLN 1 drop, Both Eyes, 3 times daily PRN  . polyethylene glycol (MIRALAX / GLYCOLAX) 17 g, Oral, Daily PRN  . potassium chloride (KLOR-CON) 10 MEQ tablet 20 mEq, Oral, Daily  . sodium chloride (OCEAN) 0.65 % SOLN nasal spray 1 spray, Each Nare, 4 times daily PRN    Objective:   PHYSICAL EXAMINATION:    VITALS:   Vitals:   08/23/20 1326  BP: (!) 118/47  Pulse: 62  SpO2: 100%  Weight: 157 lb (71.2 kg)  Height: 5\' 1"  (1.549 m)    GEN:  The patient appears stated age and is in  NAD. HEENT:  Normocephalic, atraumatic.  The mucous membranes are moist.   Neurological examination:  Orientation: The patient is alert and oriented x3.  Cranial nerves: There is good facial symmetry.  There is facial hypomimia.  Extraocular muscles are intact. The visual fields are full to confrontational testing. The speech is fluent and clear. Soft palate rises symmetrically and there is no tongue deviation. Hearing is intact to conversational tone. Sensation: Sensation is intact to light touch throughout (facial, trunk, extremities). Vibration is intact at the bilateral big toe. There is no extinction with double simultaneous stimulation.  Motor: Strength is 5/5 in the bilateral upper and lower extremities.   Shoulder shrug  is equal and symmetric.  There is no pronator drift. Deep tendon reflexes: Deep tendon reflexes are 2/4 at the bilateral biceps, triceps, brachioradialis, patella and achilles. Plantar responses are downgoing bilaterally.  Movement examination: Tone: There is mild increased tone in the LUE.   Abnormal movements: Rare tremor can be felt in the left upper extremity with distraction more than it can be seen. Coordination:  There is  decremation with RAM's, with any form of RAMS, including alternating supination and pronation of the forearm, hand opening and closing, finger taps, heel taps and toe taps, left greater than right Gait and Station: The patient has no difficulty arising out of a deep-seated chair without the use of the hands. The patient's stride length is slightly decreased.  It is decreased the more that she walks.  She does not shuffle. I have reviewed and interpreted the following labs independently   Chemistry      Component Value Date/Time   NA 137 03/30/2020 1011   NA 136 10/05/2019 1053   K 4.9 03/30/2020 1011   CL 103 03/30/2020 1011   CO2 25 03/30/2020 1011   BUN 22 03/30/2020 1011   BUN 18 10/05/2019 1053   CREATININE 1.23 (H) 03/30/2020 1011       Component Value Date/Time   CALCIUM 9.4 03/30/2020 1011   ALKPHOS 35 (L) 12/27/2019 0810   AST 11 03/30/2020 1011   ALT 8 03/30/2020 1011   BILITOT 0.4 03/30/2020 1011      Lab Results  Component Value Date   TSH 1.85 02/03/2020   Lab Results  Component Value Date   WBC 7.8 03/30/2020   HGB 12.0 03/30/2020   HCT 36.3 03/30/2020   MCV 98.1 03/30/2020   PLT 333 03/30/2020      Total time spent on today's visit was  60 minutes, including both face-to-face time and nonface-to-face time.  Time included that spent on review of records (prior notes available to me/labs/imaging if pertinent), discussing treatment and goals, answering patient's questions and coordinating care.  Cc:  [name], MD

## 2020-08-23 ENCOUNTER — Encounter: Payer: Self-pay | Admitting: Neurology

## 2020-08-23 ENCOUNTER — Other Ambulatory Visit: Payer: Self-pay

## 2020-08-23 ENCOUNTER — Ambulatory Visit (INDEPENDENT_AMBULATORY_CARE_PROVIDER_SITE_OTHER): Payer: Medicare Other | Admitting: Neurology

## 2020-08-23 VITALS — BP 118/47 | HR 62 | Ht 61.0 in | Wt 157.0 lb

## 2020-08-23 DIAGNOSIS — R1319 Other dysphagia: Secondary | ICD-10-CM | POA: Diagnosis not present

## 2020-08-23 DIAGNOSIS — G20 Parkinson's disease: Secondary | ICD-10-CM | POA: Diagnosis not present

## 2020-08-23 DIAGNOSIS — G4752 REM sleep behavior disorder: Secondary | ICD-10-CM | POA: Diagnosis not present

## 2020-08-23 MED ORDER — CARBIDOPA-LEVODOPA 25-100 MG PO TABS: 1.0000 | ORAL_TABLET | Freq: Three times a day (TID) | ORAL | 1 refills | Status: DC

## 2020-08-23 NOTE — Patient Instructions (Signed)
Start Carbidopa Levodopa as follows: Take 1/2 tablet three times daily, at least 30 minutes before meals (approximately 7am/11am/4pm), for one week Then take 1/2 tablet in the morning, 1/2 tablet in the afternoon, 1 tablet in the evening, at least 30 minutes before meals, for one week Then take 1/2 tablet in the morning, 1 tablet in the afternoon, 1 tablet in the evening, at least 30 minutes before meals, for one week Then take 1 tablet three times daily at 7am/11am/4pm, at least 30 minutes before meals   As a reminder, carbidopa/levodopa can be taken at the same time as a carbohydrate, but we like to have you take your pill either 30 minutes before a protein source or 1 hour after as protein can interfere with carbidopa/levodopa absorption.  

## 2020-08-30 ENCOUNTER — Other Ambulatory Visit: Payer: Medicare Other

## 2020-08-30 ENCOUNTER — Other Ambulatory Visit: Payer: Self-pay

## 2020-08-30 DIAGNOSIS — I5030 Unspecified diastolic (congestive) heart failure: Secondary | ICD-10-CM | POA: Diagnosis not present

## 2020-08-31 LAB — BASIC METABOLIC PANEL
BUN/Creatinine Ratio: 15 (ref 12–28)
BUN: 19 mg/dL (ref 8–27)
CO2: 24 mmol/L (ref 20–29)
Calcium: 9.3 mg/dL (ref 8.7–10.3)
Chloride: 98 mmol/L (ref 96–106)
Creatinine, Ser: 1.29 mg/dL — ABNORMAL HIGH (ref 0.57–1.00)
GFR calc Af Amer: 44 mL/min/{1.73_m2} — ABNORMAL LOW (ref >59)
GFR calc non Af Amer: 38 mL/min/{1.73_m2} — ABNORMAL LOW (ref >59)
Glucose: 106 mg/dL — ABNORMAL HIGH (ref 65–99)
Potassium: 4.4 mmol/L (ref 3.5–5.2)
Sodium: 138 mmol/L (ref 134–144)

## 2020-09-05 ENCOUNTER — Telehealth: Payer: Self-pay | Admitting: Pharmacist

## 2020-09-05 NOTE — Progress Notes (Signed)
Chronic Care Management Pharmacy Assistant   Name: [name]  MRN: [id number] DOB: Aug 07, 1934  Reason for Encounter: Medication Review  Patient Questions:  1.  Have you seen any other providers since your last visit? Yes, patient last seen Dr. Lucia Gaskins on 08/14/20 and Wells Guiles Tat DO on 08/23/20  2.  Any changes in your medicines or health? Yes, patient was stsrted on Carbidopa_Levodopa 25-100 mg [name] Bogus, DO    PCP : [name], MD  Allergies:   Allergies  Allergen Reactions   Statins     Lipitor caused hospitalization - - depletion of electrolytes, fever, nausea, loss of appetite, couldn't get out of bed   Cymbalta [Duloxetine Hcl] Other (See Comments)    Dulled her too much, difficulty urinating, change in vision   Fosamax [Alendronate Sodium] Other (See Comments)    Caused chronic issues swallowing   Amitriptyline     hallucinations   Lyrica [Pregabalin]     Unknown   Neurontin [Gabapentin] Other (See Comments)    Dizziness and sedation (patient is tolerating in lower dose)   Zanaflex [Tizanidine] Other (See Comments)    Could not sleep    Medications: Outpatient Encounter Medications as of 09/05/2020  Medication Sig   acetaminophen (TYLENOL) 500 MG tablet Take 500 mg by mouth 2 (two) times daily.   carbidopa-levodopa (SINEMET IR) 25-100 MG tablet Take 1 tablet by mouth 3 (three) times daily. 7am/11am/4pm   cholecalciferol (VITAMIN D) 1000 UNITS tablet Take 2,000 Units by mouth daily.    Cyanocobalamin 1000 MCG TBCR Take 1,000 mcg by mouth daily.    denosumab (PROLIA) 60 MG/ML SOSY injection Inject 60 mg into the skin every 6 (six) months.   Diclofenac Sodium (PENNSAID) 2 % SOLN Apply topically.   diclofenac Sodium (VOLTAREN) 1 % GEL Apply 2 g topically 4 (four) times daily.   dicyclomine (BENTYL) 10 MG capsule Take 1 capsule (10 mg total) by mouth 3 (three) times daily before meals.   escitalopram (LEXAPRO) 5 MG tablet Take 1 tablet (5 mg  total) by mouth daily.   ezetimibe (ZETIA) 10 MG tablet Take 1 tablet (10 mg total) by mouth daily.   ferrous sulfate 325 (65 FE) MG tablet Take 325 mg by mouth 3 (three) times a week.   furosemide (LASIX) 40 MG tablet Take one tablet in the AM and 1/2 tablet after lunch.   loratadine (CLARITIN) 10 MG tablet Take 10 mg by mouth daily as needed for allergies.   metoprolol succinate (TOPROL XL) 25 MG 24 hr tablet Take 1 tablet (25 mg total) by mouth daily.   omeprazole (PRILOSEC) 40 MG capsule TAKE ONE CAPSULE BY MOUTH TWICE A DAY   Polyethyl Glycol-Propyl Glycol (LUBRICANT EYE DROPS) 0.4-0.3 % SOLN Place 1 drop into both eyes 3 (three) times daily as needed (dry/irritated eyes.).   polyethylene glycol (MIRALAX / GLYCOLAX) packet Take 17 g by mouth daily as needed (constipation).   potassium chloride (KLOR-CON) 10 MEQ tablet Take 2 tablets (20 mEq total) by mouth daily.   sodium chloride (OCEAN) 0.65 % SOLN nasal spray Place 1 spray into both nostrils 4 (four) times daily as needed for congestion.   No facility-administered encounter medications on file as of 09/05/2020.    Current Diagnosis: Patient Active Problem List   Diagnosis Date Noted   Tremor of both hands 07/30/2020   Left arm swelling 04/10/2020   Aortic atherosclerosis ([place]) 03/30/2020   Cellulitis of arm, left 03/30/2020   Chronic  thoracic back pain 02/03/2020   Iron deficiency anemia 02/02/2020   Near syncope 09/27/2019   Hypertension 09/20/2019   Acute pain of right knee 08/03/2019   (HFpEF) heart failure with preserved ejection fraction ([place]) 08/02/2019   Pacemaker 07/19/2019   CKD (chronic kidney disease) 01/30/2019   Lumbar spinal stenosis 01/10/2018   Osteoarthritis of both knees 12/15/2017   Epigastric pain 09/09/2017   Neuralgia 09/09/2017   Claudication of calf muscles ([place]) 11/22/2016   DOE (dyspnea on exertion) 11/22/2016   Hyperreflexia of lower extremity 07/16/2016   Bilateral  leg paresthesia 07/16/2016   Carotid arterial disease ([place]) 07/13/2016   Poor balance 06/23/2016   Weakness of both lower extremities 06/23/2016   Sinus node dysfunction ([facility]) 05/05/2016   Right shoulder pain 03/14/2016   Multinodular goiter (nontoxic) 02/21/2016   Neck pain 02/15/2016   Prediabetes 12/17/2015   Dysphagia 12/17/2015   T8 vertebral fracture ([place]) 04/09/2015   Overweight 10/09/2011   Allergic rhinitis, cause unspecified    GERD (gastroesophageal reflux disease) 02/05/2011   Vitamin B 12 deficiency 04/17/2009   Hyperlipidemia 04/17/2009   Osteoporosis 04/17/2009   ADENOCARCINOMA, BREAST, HX OF 04/17/2009   CONSTIPATION 03/08/2009   COLONIC POLYPS, HYPERPLASTIC, HX OF 03/06/2009    Goals Addressed   None       BP Readings from Last 3 Encounters:  08/23/20 (!) 118/47  08/14/20 (!) 121/53  07/30/20 138/68    Lab Results  Component Value Date   HGBA1C 5.9 (H) 02/03/2020     Patient obtains medications through Vials  90 Days   Last adherence delivery included (date:08/02/20): (medication name and frequency) Furosemide 40 mg 1 tab breakfast 08/02/20 Potassium chloride ER 10 meq 2 tabs breakfast 08/02/20 Omeprazole 40 mg 1 cap by mouth breakfast and 1 cap evening 08/08/20 Escitalopram 5 mg 1 tab daily 08/08/20  Patient is due for next adherence delivery on: 10/17/20. Called patient and reviewed medication needs.  Patient declined need for refills at this time. Patient is aware of how to contact pharmacist if refills are needed prior to next adherence delivery.   [name], [place] [phone]

## 2020-09-10 ENCOUNTER — Ambulatory Visit: Payer: Medicare Other | Attending: Neurology | Admitting: Physical Therapy

## 2020-09-10 ENCOUNTER — Telehealth: Payer: Self-pay | Admitting: Physical Therapy

## 2020-09-10 ENCOUNTER — Other Ambulatory Visit: Payer: Self-pay

## 2020-09-10 ENCOUNTER — Encounter: Payer: Self-pay | Admitting: Physical Therapy

## 2020-09-10 DIAGNOSIS — R2681 Unsteadiness on feet: Secondary | ICD-10-CM | POA: Insufficient documentation

## 2020-09-10 DIAGNOSIS — R29818 Other symptoms and signs involving the nervous system: Secondary | ICD-10-CM | POA: Diagnosis not present

## 2020-09-10 DIAGNOSIS — R2689 Other abnormalities of gait and mobility: Secondary | ICD-10-CM

## 2020-09-10 DIAGNOSIS — M6281 Muscle weakness (generalized): Secondary | ICD-10-CM | POA: Diagnosis not present

## 2020-09-10 NOTE — Therapy (Signed)
[facility] [address] [place], [place], [address] Phone: [phone]   Fax:  [phone]  Physical Therapy Evaluation  Patient Details  Name: [name] MRN: [id number] Date of Birth: Oct 30, 1934 Referring Provider (PT): Tat, Wells Guiles   Encounter Date: 09/10/2020   PT End of Session - 09/10/20 1938    Visit Number 1    Number of Visits 5    Date for PT Re-Evaluation 11/09/20    Authorization Type Medicare/AARP    PT Start Time 1315    PT Stop Time 1405    PT Time Calculation (min) 50 min    Equipment Utilized During Treatment Gait belt    Activity Tolerance Patient limited by fatigue   Fatigue/stress bring on bilateral UE and BLE tremors/?freezing episodes   Behavior During Therapy Anxious;Flat affect   Flat affect, monotone voice; SOB with activity, but O2 sats remain 96-98%          Past Medical History:  Diagnosis Date  . Allergy   . Anemia   . Cancer of left breast ([place]) 1978  . CHF (congestive heart failure) ([place])   . Chronic thoracic back pain    "T8; fracture; 03/2015; no OR" (05/05/2016)  . GERD (gastroesophageal reflux disease)   . Heart murmur   . History of hiatal hernia   . Hyperlipidemia   . Hyperplastic colon polyp   . Hypertension   . IBS (irritable bowel syndrome)   . Multiple thyroid nodules   . Osteoporosis    T8 compression fx 03/2015   . Personal history of radiation therapy   . Presence of permanent cardiac pacemaker   . Vitamin B12 deficiency     Past Surgical History:  Procedure Laterality Date  . ANTERIOR CERVICAL DECOMP/DISCECTOMY FUSION  2008   C5-6  . AUGMENTATION MAMMAPLASTY    . BACK SURGERY    . BREAST SURGERY    . CARDIAC CATHETERIZATION  2001  . EP IMPLANTABLE DEVICE N/A 05/05/2016   Procedure: Pacemaker Implant;  Surgeon: Evans Lance, MD;  Location: [facility];  Service: Cardiovascular;  Laterality: N/A;  . EXCISIONAL HEMORRHOIDECTOMY  1984    With subsequent correction of surgery  . INSERT / REPLACE / REMOVE PACEMAKER    . INTRAVASCULAR PRESSURE WIRE/FFR STUDY N/A 10/07/2019   Procedure: INTRAVASCULAR PRESSURE WIRE/FFR STUDY;  Surgeon: Jettie Booze, MD;  Location: [facility];  Service: Cardiovascular;  Laterality: N/A;  . KNEE ARTHROSCOPY Left    "meniscus tear"  . LEFT HEART CATH AND CORONARY ANGIOGRAPHY N/A 10/07/2019   Procedure: LEFT HEART CATH AND CORONARY ANGIOGRAPHY;  Surgeon: Jettie Booze, MD;  Location: [facility];  Service: Cardiovascular;  Laterality: N/A;  . MASTECTOMY Left 1978  . PLACEMENT OF BREAST IMPLANTS Left 1981  . REDUCTION MAMMAPLASTY Right   . SHOULDER ARTHROSCOPY W/ ROTATOR CUFF REPAIR Left 02/2006   "tear"  . TUBAL LIGATION      There were no vitals filed for this visit.    Subjective Assessment - 09/10/20 1321    Subjective I noticed several months ago, with treatment for the cancer on my head, that I was having some shaking in my R hand when I would wake up.  All of this is happening so fast.  Husband and pt both report they feel symptoms are worsening.  She reports 3 falls recently; 2 falls happened in the bed and she didn't even know they happened.  Tremor in  both hands and in both legs (noted with transfers).  Fell last Thursday upon getting up from the recliner.  Pt is currently using UpWalker (that was husband's) and has been using for several months.  Tremors come on more with transfers and knees tend to give way.    Patient is accompained by: Family member   Husband   Pertinent History PMH includes CHF, pacemaker placement 2017, cataract surgery, compression fracture, chronic back pain.    Patient Stated Goals Pt's goals for therapy are to -Unsure-this is just happening so fast.  Pt agrees to working to lessen fall risk and improve safe mobility.    Currently in Pain? No/denies              [facility] PT Assessment - 09/10/20 0001      Assessment   Medical  Diagnosis Parkinson's disease    Referring Provider (PT) Tat, Wells Guiles    Onset Date/Surgical Date 08/23/20   MD visit; pt reports sx starting in August 2021     Precautions   Precautions Fall    Precaution Comments Hx of pacemaker/CHF      Balance Screen   Has the patient fallen in the past 6 months Yes    How many times? 3    Has the patient had a decrease in activity level because of a fear of falling?  Yes    Is the patient reluctant to leave their home because of a fear of falling?  Yes      [place] residence    Living Arrangements Spouse/significant other    Available Help at Discharge Family    Type of Colon Access Level entry;Elevator    Home Layout One level    [place] - 4 wheels   UpWalker     Prior Function   Level of [place] Retired    Leisure Enjoys cooking, spending time with family      Observation/Other Assessments   Observations Flat affect, monotone voice    Focus on Therapeutic Outcomes (FOTO)  NA      Posture/Postural Control   Posture/Postural Control Postural limitations    Postural Limitations Rounded Shoulders;Forward head      ROM / Strength   AROM / PROM / Strength AROM      AROM   Overall AROM  Within functional limits for tasks performed   for BLEs     Transfers   Transfers Sit to Stand;Stand to Sit    Sit to Stand 4: Min assist    Sit to Stand Details (indicate cue type and reason) With 5x sit<>stand, pt slows after 3rd rep, with retropulsion, but decreased overall ability to propel forward to stand.  Wtih fatigue, BLE and BUE tremors noted.    Five time sit to stand comments  42.56    Stand to Sit 4: Min assist;With upper extremity assist;To chair/3-in-1;Uncontrolled descent    Transfer Cueing After 5x sit<>stand test, during other sit<>stand in eval, PT provides cues for hand placement, technique for improved safety with transfers.       Ambulation/Gait   Ambulation/Gait Yes    Ambulation/Gait Assistance 4: Min guard;4: Min assist    Ambulation Distance (Feet) 70 Feet   x 2, with seated rest break at locked rollator   Assistive device 4-wheeled walker   UpWalker   Gait Pattern Step-through pattern;Decreased step length - right;Decreased step length - left;Festinating;Trunk  flexed;Narrow base of support   Near scissoring gait.  When pt fatigues, BLE tremors and BUE tremors increase.  PT provides cues and assist to lock brakes and assist pt to turn to sit at locked rollator.   Ambulation Surface Level;Indoor    Gait velocity 24.54 sec = 1.34 ft/sec                      Objective measurements completed on examination: See above findings.            Self Care:   PT Education - 09/10/20 1936    Education Details Discussed PT eval results, pt deficits/fall risk, and POC; Discussed techniques to help with her significant tremor/?freezing of movement, including husband helping to cue her for hand placement and transfer technique as well as if freezing/tremors come on, to STOP, relax with deep breaths, then start motion again until she can get to safe place to sit.  Discussed need for husband supervision/assist at all times when up due to increased fall risk.    Person(s) Educated Patient;Spouse    Methods Explanation    Comprehension Verbalized understanding               PT Long Term Goals - 09/10/20 1951      PT LONG TERM GOAL #1   Title Pt/husband will demo correct transfer technique for sit<>stand from chair to [employer].  TARGET 10/05/2020    Time 4    Period Weeks    Status New      PT LONG TERM GOAL #2   Title Pt will stand at least 2 minutes with UE support with no episode of retropulsion, for improved standing to participate in ADLs.    Baseline retropulsion upon standing    Time 4    Period Weeks    Status New      PT LONG TERM GOAL #3   Title Pt will improve gait velocity to at least  1.5 ft/sec for improved gait efficiency and safety for household gait.    Time 4    Period Weeks      PT LONG TERM GOAL #4   Title Pt/husband will verbalize understnading of fall prevention in home environment.    Time 4    Period Weeks                  Plan - 09/10/20 1940    Clinical Impression Statement Pt is an 86 year old female who presents to OPPT with recent dx of Parkinson's disease.  She reports symptoms became noticeable around August 2021  She started Sinemet in the past several, but does not notice any improvements; husband and pt feel her symptoms are worsening.  She has had 3 falls, 2 from the bed and one last week with standing from recliner where she fell backwards.  She presents today with BUE and BLE tremors that increased significantly with fatigue and with situational stress (trying to stand in lobby with husband's assist to walker or with repeated sit<>stand).  She has retropulsion with sit<>stand, significant bradykinesia with quick fatigue of BLEs (takes >42 sec to complete 5x sit<>stand, with additional assist last 2 reps).  She demonstrates decreased functional strength, decreased gait velocity of 1.34 ft/sec, placing her at fall risk.  With gait, pt noted to fatigue after about 70 ft, which brings on BUE and BLE tremors and questionable freezing episodes, where pt has to be assisted to sit.  (Husband describes as  worse than PT observed in PT session today).  Pt also demo decreased endurance, decreased timing and coordination of gait and transfers.  She is at high risk of falls and has had recent falls.  She would benefit from skilled PT to address the above stated deficits to decrease fall risk, to improve functional mobility, and to decrease caregiver burden.    Personal Factors and Comorbidities Comorbidity 3+    Comorbidities See full problem list, but of note:  CHF, pacemaker placement, chronic back pain, hx of compression fractures    Examination-Activity  Limitations Locomotion Level;Transfers;Hygiene/Grooming;Toileting;Dressing;Stand    Examination-Participation Restrictions Meal Prep;Community Activity    Stability/Clinical Decision Making Unstable/Unpredictable    Clinical Decision Making High    Rehab Potential Fair    PT Frequency 1x / week    PT Duration 4 weeks   based on progress, will reassess for additional POC   PT Treatment/Interventions ADLs/Self Care Home Management;DME Instruction;Neuromuscular re-education;Balance training;Therapeutic exercise;Therapeutic activities;Functional mobility training;Gait training;Patient/family education;Wheelchair mobility training    PT Next Visit Plan Transfer training, gait training, tips to reduce freezing/festination episodes, safety with short distance gait and turns.  Request from Dr. Carles Collet order for transport chair and instruct husband in safety with transfers and use of w/c as needed.    Recommended Other Services Will see how pt tolerates OPPT, but pt may need to transition to HHPT due to fatigue level and fall risk    Consulted and Agree with Plan of Care Patient;Family member/caregiver    Family Member Consulted Husband           Patient will benefit from skilled therapeutic intervention in order to improve the following deficits and impairments:  Abnormal gait,Difficulty walking,Decreased safety awareness,Decreased activity tolerance,Decreased balance,Decreased mobility,Decreased strength,Postural dysfunction  Visit Diagnosis: Other abnormalities of gait and mobility  Unsteadiness on feet  Other symptoms and signs involving the nervous system  Muscle weakness (generalized)     Problem List Patient Active Problem List   Diagnosis Date Noted  . Tremor of both hands 07/30/2020  . Left arm swelling 04/10/2020  . Aortic atherosclerosis ([place]) 03/30/2020  . Cellulitis of arm, left 03/30/2020  . Chronic thoracic back pain 02/03/2020  . Iron deficiency anemia 02/02/2020  . Near  syncope 09/27/2019  . Hypertension 09/20/2019  . Acute pain of right knee 08/03/2019  . (HFpEF) heart failure with preserved ejection fraction ([place]) 08/02/2019  . Pacemaker 07/19/2019  . CKD (chronic kidney disease) 01/30/2019  . Lumbar spinal stenosis 01/10/2018  . Osteoarthritis of both knees 12/15/2017  . Epigastric pain 09/09/2017  . Neuralgia 09/09/2017  . Claudication of calf muscles ([place]) 11/22/2016  . DOE (dyspnea on exertion) 11/22/2016  . Hyperreflexia of lower extremity 07/16/2016  . Bilateral leg paresthesia 07/16/2016  . Carotid arterial disease ([place]) 07/13/2016  . Poor balance 06/23/2016  . Weakness of both lower extremities 06/23/2016  . Sinus node dysfunction ([place]) 05/05/2016  . Right shoulder pain 03/14/2016  . Multinodular goiter (nontoxic) 02/21/2016  . Neck pain 02/15/2016  . Prediabetes 12/17/2015  . Dysphagia 12/17/2015  . T8 vertebral fracture ([place]) 04/09/2015  . Overweight 10/09/2011  . Allergic rhinitis, cause unspecified   . GERD (gastroesophageal reflux disease) 02/05/2011  . Vitamin B 12 deficiency 04/17/2009  . Hyperlipidemia 04/17/2009  . Osteoporosis 04/17/2009  . ADENOCARCINOMA, BREAST, HX OF 04/17/2009  . CONSTIPATION 03/08/2009  . COLONIC POLYPS, HYPERPLASTIC, HX OF 03/06/2009    Breslin Burklow W. 09/10/2020, 7:55 PM  Frazier Butt., PT   [facility]  [place], [place], [address] Phone: [phone]   Fax:  [phone]  Name: [name] MRN: [id number] Date of Birth: 04-19-1935

## 2020-09-10 NOTE — Telephone Encounter (Signed)
Dr. Carles Collet, I evaluated [name] today.  I wanted to make you aware of significant retropulsion with attempts to stand and upon standing, bilateral upper extremity tremor and bilateral lower extremity tremor with trying to get set up in [facility] (in waiting room with husband's assist, as this is the device they brought in).  In addition, with fatigue of lower extremity muscles, in 5x sit<>stand and after about 70 ft of gait, pt begins BUE and BLE tremors and near freezing-type episodes, where she needs assistance to safely return to sitting.  In addition, she has had an additional fall since she saw you, upon standing, where she fell backwards to the floor.  Per pt and husband report, her mobility and balance symptoms are worsening, and her presentation appears different today from your notes from your late January visit.    I wanted to make you aware for potential follow up with the patient as needed.  Finally, I feel that due to pt's fluctuations in mobility that may cause a fall and difficulty with ambulation for longer distances, she would benefit from transport wheelchair for ease of mobility in home and in community.  Please write order for transport w/c if you agree.  Please let me know if you have any other questions or concerns.  Thank you.  [name], PT 09/10/20 8:04 PM Phone: [phone] Fax: [phone]

## 2020-09-11 ENCOUNTER — Telehealth: Payer: Self-pay | Admitting: Internal Medicine

## 2020-09-11 NOTE — Telephone Encounter (Signed)
Patient scheduled for Prolia injection on Friday 2.18.22 No out of pocket owed.

## 2020-09-11 NOTE — Telephone Encounter (Signed)
Thanks for the note.  Retropulsion may favor atypical state more (one of my considerations in her).  Would she benefit from Darrick Grinder?  As for the transport chair, its probably best (if able) to either pay out of pocket for this (fairly cheap on [employer]) or get from good will b/c she will likely need more expensive chairs in the future and if insurance pays for this they won't pay for another chair (either regular WC or power WC in the future) in my experience.  Thoughts?

## 2020-09-11 NOTE — Telephone Encounter (Signed)
I think the walker that she has is actually a good one for her; it is just that the tremors come on so unexpectedly.  The UpWalker she has is likely easier for her husband to maneuver and assist with as well.  You are correct about the possibility of additional wheelchair needs, so I will communicate to them about trying to purchase a transport chair out of pocket.  Thanks for your input.   Sincerely, Mady Haagensen, PT 09/11/20 8:11 AM Phone: [phone] Fax: [phone]

## 2020-09-14 ENCOUNTER — Telehealth: Payer: Self-pay | Admitting: Internal Medicine

## 2020-09-14 ENCOUNTER — Other Ambulatory Visit: Payer: Self-pay

## 2020-09-14 ENCOUNTER — Ambulatory Visit (INDEPENDENT_AMBULATORY_CARE_PROVIDER_SITE_OTHER): Payer: Medicare Other

## 2020-09-14 DIAGNOSIS — M810 Age-related osteoporosis without current pathological fracture: Secondary | ICD-10-CM | POA: Diagnosis not present

## 2020-09-14 MED ORDER — DENOSUMAB 60 MG/ML ~~LOC~~ SOSY
60.0000 mg | PREFILLED_SYRINGE | Freq: Once | SUBCUTANEOUS | Status: AC
Start: 2020-09-14 — End: 2020-09-14
  Administered 2020-09-14: 60 mg via SUBCUTANEOUS

## 2020-09-14 NOTE — Telephone Encounter (Signed)
Patient is scheduled for her next prolia injection on 8.19.2022.

## 2020-09-14 NOTE — Progress Notes (Signed)
Patient came into the office today with her husband for a nurse visit to receive her Prolia injection. Patient tolerated the injection well in her left arm. No questions or concerns at this time.

## 2020-09-20 ENCOUNTER — Other Ambulatory Visit: Payer: Self-pay

## 2020-09-20 ENCOUNTER — Ambulatory Visit: Payer: Medicare Other | Admitting: Physical Therapy

## 2020-09-20 DIAGNOSIS — R29818 Other symptoms and signs involving the nervous system: Secondary | ICD-10-CM

## 2020-09-20 DIAGNOSIS — R2689 Other abnormalities of gait and mobility: Secondary | ICD-10-CM | POA: Diagnosis not present

## 2020-09-20 DIAGNOSIS — R2681 Unsteadiness on feet: Secondary | ICD-10-CM | POA: Diagnosis not present

## 2020-09-20 DIAGNOSIS — M6281 Muscle weakness (generalized): Secondary | ICD-10-CM | POA: Diagnosis not present

## 2020-09-20 NOTE — Progress Notes (Signed)
prolia Injection given.   Tamberlyn Midgley J Yamen Castrogiovanni, MD  

## 2020-09-20 NOTE — Patient Instructions (Addendum)
Lightweight transport wheelchair  Sit to Stand Transfers:  1. Scoot out to the edge of the chair 2. Place your feet flat on the floor, shoulder width apart.  Make sure your feet are tucked just under your knees. 3. Lean forward (nose over toes) with momentum, and stand up tall with your best posture.  If you need to use your arms, use them as a quick boost up to stand. 4. If you are in a low or soft chair, you can lean back and then forward up to stand, in order to get more momentum. 5. Once you are standing, make sure you are looking ahead and standing tall.  To sit down:  1. Back up until you feel the chair behind your legs. 2. Bend at your hips, reaching  Back for you chair, if needed, then slowly squat to sit down on your chair.   Access Code: [email or web address] URL: [email or web address] Date: 09/20/2020 Prepared by: Mady Haagensen  Exercises Seated March - 1-2 x daily - 5 x weekly - 1-2 sets - 10 reps Seated Long Arc Quad - 1-2 x daily - 5 x weekly - 1-2 sets - 10 reps Seated Ankle Dorsiflexion AROM - 1-2 x daily - 5 x weekly - 1-2 sets - 10 reps Seated Heel Raise - 1-2 x daily - 5 x weekly - 1-2 sets - 10 reps

## 2020-09-21 ENCOUNTER — Encounter: Payer: Self-pay | Admitting: Physical Therapy

## 2020-09-21 NOTE — Therapy (Signed)
[facility] [address] [place], [place], [address] Phone: [phone]   Fax:  [phone]  Physical Therapy Treatment  Patient Details  Name: [name] MRN: [id number] Date of Birth: August 07, 1934 Referring Provider (PT): Tat, Wells Guiles   Encounter Date: 09/20/2020   PT End of Session - 09/21/20 1918    Visit Number 2    Number of Visits 5    Date for PT Re-Evaluation 11/09/20    Authorization Type Medicare/AARP    PT Start Time 1418   late start from PT finishing with previous patient   PT Stop Time 1458    PT Time Calculation (min) 40 min    Activity Tolerance Patient limited by fatigue   Fatigue/stress bring on bilateral UE and BLE tremors/?freezing episodes   Behavior During Therapy Anxious;Flat affect   Flat affect, monotone voice; SOB with activity, but O2 sats remain 96-98%          Past Medical History:  Diagnosis Date  . Allergy   . Anemia   . Cancer of left breast ([facility]) 1978  . CHF (congestive heart failure) ([place])   . Chronic thoracic back pain    "T8; fracture; 03/2015; no OR" (05/05/2016)  . GERD (gastroesophageal reflux disease)   . Heart murmur   . History of hiatal hernia   . Hyperlipidemia   . Hyperplastic colon polyp   . Hypertension   . IBS (irritable bowel syndrome)   . Multiple thyroid nodules   . Osteoporosis    T8 compression fx 03/2015   . Personal history of radiation therapy   . Presence of permanent cardiac pacemaker   . Vitamin B12 deficiency     Past Surgical History:  Procedure Laterality Date  . ANTERIOR CERVICAL DECOMP/DISCECTOMY FUSION  2008   C5-6  . AUGMENTATION MAMMAPLASTY    . BACK SURGERY    . BREAST SURGERY    . CARDIAC CATHETERIZATION  2001  . EP IMPLANTABLE DEVICE N/A 05/05/2016   Procedure: Pacemaker Implant;  Surgeon: Evans Lance, MD;  Location: [facility];  Service: Cardiovascular;  Laterality: N/A;  . EXCISIONAL HEMORRHOIDECTOMY  1984    With subsequent correction of surgery  . INSERT / REPLACE / REMOVE PACEMAKER    . INTRAVASCULAR PRESSURE WIRE/FFR STUDY N/A 10/07/2019   Procedure: INTRAVASCULAR PRESSURE WIRE/FFR STUDY;  Surgeon: Jettie Booze, MD;  Location: [facility];  Service: Cardiovascular;  Laterality: N/A;  . KNEE ARTHROSCOPY Left    "meniscus tear"  . LEFT HEART CATH AND CORONARY ANGIOGRAPHY N/A 10/07/2019   Procedure: LEFT HEART CATH AND CORONARY ANGIOGRAPHY;  Surgeon: Jettie Booze, MD;  Location: [facility];  Service: Cardiovascular;  Laterality: N/A;  . MASTECTOMY Left 1978  . PLACEMENT OF BREAST IMPLANTS Left 1981  . REDUCTION MAMMAPLASTY Right   . SHOULDER ARTHROSCOPY W/ ROTATOR CUFF REPAIR Left 02/2006   "tear"  . TUBAL LIGATION      There were no vitals filed for this visit.   Subjective Assessment - 09/20/20 1428    Subjective I had a fall the other day in the bathroom.  Was holding onto the counter/sink, and then I slid down the wall and hit my bottom.  Some days are better than others.  Whatever it is, it happens with my neck-tightens up and then I realize that I might fall.  When that happens, sometimes it's too late to do anything to stop the fall.  Patient is accompained by: Family member   Husband   Pertinent History PMH includes CHF, pacemaker placement 2017, cataract surgery, compression fracture, chronic back pain.    Patient Stated Goals Pt's goals for therapy are to -Unsure-this is just happening so fast.  Pt agrees to working to lessen fall risk and improve safe mobility.    Currently in Pain? No/denies                             [facility] Adult PT Treatment/Exercise - 09/20/20 1420      Transfers   Transfers Sit to Stand;Stand to Sit    Sit to Stand 4: Min assist;4: Min guard;From bed;With upper extremity assist    Sit to Stand Details Verbal cues for sequencing;Verbal cues for technique    Sit to Stand Details (indicate cue type and reason)  Cues for scooting, for hand placement to push from mat, foot placement, increased forward lean, then reach for walker handles upon standing.    Stand to Sit 4: Min assist;With upper extremity assist;Uncontrolled descent;To bed    Comments Performed x 3 reps, then next rep, pt demo increased tremors in BLEs and overall bradykinesia.  Took a brief rest break, cues for breathing, and then began sit<>stand transfer practice again.  Practiced at least 6-8 reps throughout session.      Ambulation/Gait   Ambulation/Gait Yes    Ambulation/Gait Assistance 4: Min guard;4: Min assist    Ambulation Distance (Feet) 60 Feet   x 2   Assistive device 4-wheeled walker   UpWalker   Gait Pattern Step-through pattern;Decreased step length - right;Decreased step length - left;Festinating;Trunk flexed;Narrow base of support    Ambulation Surface Level;Indoor    Gait Comments Attempted wide BOS lateral weightshifting prior to initiating gait, but after about 5 reps, pt begins to experience tightness in her neck and felt that she began the tremor/festinating.  Cues for breathing to relax, and pt assisted to sit.      Self-Care   Self-Care Other Self-Care Comments    Other Self-Care Comments  Discussed tips to help prevent pt's festination/tremor episodes, including husband providing close supervision, deep breathing to relax to lessen the episode or give her more time to prepare to sit.  Educated pt/husband on being close to help her sit if she begins to experience these episodes.  Discussed options for transport  wheelchair; likely the best option is to purchase transport lightweight wheelchair out of pocket, in the case they may need additional w/c equipment in the future.      Exercises   Exercises Other Exercises    Other Exercises  Seated leg exercises at edge of mat:  marching in place x 10, LAQ x 10 reps, ankle pumps-heel raises x 10, toe raises x 10.                  PT Education - 09/21/20 1917     Education Details HEP-see instructions, sit<>stand technique, obtaining lightweight transport w/c    Person(s) Educated Patient;Spouse    Methods Explanation;Demonstration;Handout    Comprehension Verbalized understanding;Returned demonstration               PT Long Term Goals - 09/10/20 1951      PT LONG TERM GOAL #1   Title Pt/husband will demo correct transfer technique for sit<>stand from chair to [employer].  TARGET 10/05/2020    Time 4    Period [place]  Status New      PT LONG TERM GOAL #2   Title Pt will stand at least 2 minutes with UE support with no episode of retropulsion, for improved standing to participate in ADLs.    Baseline retropulsion upon standing    Time 4    Period Weeks    Status New      PT LONG TERM GOAL #3   Title Pt will improve gait velocity to at least 1.5 ft/sec for improved gait efficiency and safety for household gait.    Time 4    Period Weeks      PT LONG TERM GOAL #4   Title Pt/husband will verbalize understnading of fall prevention in home environment.    Time 4    Period Weeks                 Plan - 09/21/20 1919    Clinical Impression Statement Educated pt/husband on sit<>stand technique and tips to try to reduce festination/tremor episodes with transfers and standing.  Initiated HEP.  Pt has at least 2 episodes during PT session today where BLEs become tremulous and UEs freeze in place while holding onto her walker.  One episode is resolved with cues for deep breathing; second episode, pt has to be assisted to safely sit at mat, with PT assist.  Pt reports she begins to feel this way and then she begins to fall. Pt is able to perform seated leg exercises without difficulty today.  Will continue to monitor pt's response to activity and provide feedback for optimal safety and funcitonal mobility.    Personal Factors and Comorbidities Comorbidity 3+    Comorbidities See full problem list, but of note:  CHF, pacemaker placement,  chronic back pain, hx of compression fractures    Examination-Activity Limitations Locomotion Level;Transfers;Hygiene/Grooming;Toileting;Dressing;Stand    Examination-Participation Restrictions Meal Prep;Community Activity    Stability/Clinical Decision Making Unstable/Unpredictable    Rehab Potential Fair    PT Frequency 1x / week    PT Duration 4 weeks   based on progress, will reassess for additional POC   PT Treatment/Interventions ADLs/Self Care Home Management;DME Instruction;Neuromuscular re-education;Balance training;Therapeutic exercise;Therapeutic activities;Functional mobility training;Gait training;Patient/family education;Wheelchair mobility training    PT Next Visit Plan Transfer training, gait training, tips to reduce freezing/festination episodes, safety with short distance gait and turns. Was pt able to look into getting transport chair?  Review HEP    Consulted and Agree with Plan of Care Patient;Family member/caregiver    Family Member Consulted Husband           Patient will benefit from skilled therapeutic intervention in order to improve the following deficits and impairments:  Abnormal gait,Difficulty walking,Decreased safety awareness,Decreased activity tolerance,Decreased balance,Decreased mobility,Decreased strength,Postural dysfunction  Visit Diagnosis: Unsteadiness on feet  Muscle weakness (generalized)  Other symptoms and signs involving the nervous system     Problem List Patient Active Problem List   Diagnosis Date Noted  . Tremor of both hands 07/30/2020  . Left arm swelling 04/10/2020  . Aortic atherosclerosis ([place]) 03/30/2020  . Cellulitis of arm, left 03/30/2020  . Chronic thoracic back pain 02/03/2020  . Iron deficiency anemia 02/02/2020  . Near syncope 09/27/2019  . Hypertension 09/20/2019  . Acute pain of right knee 08/03/2019  . (HFpEF) heart failure with preserved ejection fraction ([place]) 08/02/2019  . Pacemaker 07/19/2019  . CKD  (chronic kidney disease) 01/30/2019  . Lumbar spinal stenosis 01/10/2018  . Osteoarthritis of both knees 12/15/2017  . Epigastric pain  09/09/2017  . Neuralgia 09/09/2017  . Claudication of calf muscles ([place]) 11/22/2016  . DOE (dyspnea on exertion) 11/22/2016  . Hyperreflexia of lower extremity 07/16/2016  . Bilateral leg paresthesia 07/16/2016  . Carotid arterial disease ([place]) 07/13/2016  . Poor balance 06/23/2016  . Weakness of both lower extremities 06/23/2016  . Sinus node dysfunction ([place]) 05/05/2016  . Right shoulder pain 03/14/2016  . Multinodular goiter (nontoxic) 02/21/2016  . Neck pain 02/15/2016  . Prediabetes 12/17/2015  . Dysphagia 12/17/2015  . T8 vertebral fracture ([facility]) 04/09/2015  . Overweight 10/09/2011  . Allergic rhinitis, cause unspecified   . GERD (gastroesophageal reflux disease) 02/05/2011  . Vitamin B 12 deficiency 04/17/2009  . Hyperlipidemia 04/17/2009  . Osteoporosis 04/17/2009  . ADENOCARCINOMA, BREAST, HX OF 04/17/2009  . CONSTIPATION 03/08/2009  . COLONIC POLYPS, HYPERPLASTIC, HX OF 03/06/2009    Sharelle Burditt W. 09/21/2020, 7:23 PM  Frazier Butt., PT   [facility] [address] [place], [place], [address] Phone: [phone]   Fax:  [phone]  Name: BRYANAH SIDELL MRN: [id number] Date of Birth: Aug 14, 1934

## 2020-09-25 ENCOUNTER — Encounter: Payer: Self-pay | Admitting: Physical Therapy

## 2020-09-25 ENCOUNTER — Ambulatory Visit: Payer: Medicare Other | Attending: Neurology | Admitting: Physical Therapy

## 2020-09-25 ENCOUNTER — Telehealth: Payer: Self-pay | Admitting: Neurology

## 2020-09-25 ENCOUNTER — Other Ambulatory Visit: Payer: Self-pay

## 2020-09-25 VITALS — BP 71/39 | HR 63

## 2020-09-25 DIAGNOSIS — R1310 Dysphagia, unspecified: Secondary | ICD-10-CM | POA: Insufficient documentation

## 2020-09-25 DIAGNOSIS — M6281 Muscle weakness (generalized): Secondary | ICD-10-CM | POA: Diagnosis not present

## 2020-09-25 DIAGNOSIS — R2689 Other abnormalities of gait and mobility: Secondary | ICD-10-CM | POA: Diagnosis not present

## 2020-09-25 DIAGNOSIS — R29818 Other symptoms and signs involving the nervous system: Secondary | ICD-10-CM | POA: Insufficient documentation

## 2020-09-25 DIAGNOSIS — R471 Dysarthria and anarthria: Secondary | ICD-10-CM | POA: Diagnosis not present

## 2020-09-25 DIAGNOSIS — R2681 Unsteadiness on feet: Secondary | ICD-10-CM | POA: Insufficient documentation

## 2020-09-25 NOTE — Telephone Encounter (Signed)
-----   Message from Frazier Butt, PT sent at 09/25/2020  3:23 PM EST ----- Good afternoon, I wanted to route today's PT note to you both.  She had significant drop in her blood pressure from sit to stand positions; she is continuing to have excess fatigue with minimal activity, and with short bouts of standing, gait, she either begins to have freezing/tremor episodes or she closes eyes and appears she is close to passing out.  I advised pt and husband to contact either/both of you as well as cardiologist, as her symptoms are limiting therapy and are putting patient at increased fall risk.  I have recommended transport w/c for home use for increased safety in home environment.  Please contact me if you have questions.  Mady Haagensen, PT

## 2020-09-25 NOTE — Therapy (Signed)
[facility] [address] [place], [place], [address] Phone: [phone]   Fax:  [phone]  Physical Therapy Treatment  Patient Details  Name: [name] MRN: [id number] Date of Birth: 1935-05-14 Referring Provider (PT): Tat, Wells Guiles   Encounter Date: 09/25/2020   PT End of Session - 09/25/20 1106    Visit Number 3    Number of Visits 5    Date for PT Re-Evaluation 11/09/20    Authorization Type Medicare/AARP    PT Start Time 1103    PT Stop Time 1150    PT Time Calculation (min) 47 min    Equipment Utilized During Treatment Gait belt    Activity Tolerance Patient limited by fatigue   Fatigue/stress bring on bilateral UE and BLE tremors/?freezing episodes   Behavior During Therapy Anxious;Flat affect   Flat affect, monotone voice; SOB with activity, but O2 sats remain 96-98%          Past Medical History:  Diagnosis Date  . Allergy   . Anemia   . Cancer of left breast ([facility]) 1978  . CHF (congestive heart failure) ([place] [facility] [place])   . Chronic thoracic back pain    "T8; fracture; 03/2015; no OR" (05/05/2016)  . GERD (gastroesophageal reflux disease)   . Heart murmur   . History of hiatal hernia   . Hyperlipidemia   . Hyperplastic colon polyp   . Hypertension   . IBS (irritable bowel syndrome)   . Multiple thyroid nodules   . Osteoporosis    T8 compression fx 03/2015   . Personal history of radiation therapy   . Presence of permanent cardiac pacemaker   . Vitamin B12 deficiency     Past Surgical History:  Procedure Laterality Date  . ANTERIOR CERVICAL DECOMP/DISCECTOMY FUSION  2008   C5-6  . AUGMENTATION MAMMAPLASTY    . BACK SURGERY    . BREAST SURGERY    . CARDIAC CATHETERIZATION  2001  . EP IMPLANTABLE DEVICE N/A 05/05/2016   Procedure: Pacemaker Implant;  Surgeon: Evans Lance, MD;  Location: [facility];  Service: Cardiovascular;  Laterality: N/A;  . EXCISIONAL HEMORRHOIDECTOMY  1984   With  subsequent correction of surgery  . INSERT / REPLACE / REMOVE PACEMAKER    . INTRAVASCULAR PRESSURE WIRE/FFR STUDY N/A 10/07/2019   Procedure: INTRAVASCULAR PRESSURE WIRE/FFR STUDY;  Surgeon: Jettie Booze, MD;  Location: [facility];  Service: Cardiovascular;  Laterality: N/A;  . KNEE ARTHROSCOPY Left    "meniscus tear"  . LEFT HEART CATH AND CORONARY ANGIOGRAPHY N/A 10/07/2019   Procedure: LEFT HEART CATH AND CORONARY ANGIOGRAPHY;  Surgeon: Jettie Booze, MD;  Location: [facility];  Service: Cardiovascular;  Laterality: N/A;  . MASTECTOMY Left 1978  . PLACEMENT OF BREAST IMPLANTS Left 1981  . REDUCTION MAMMAPLASTY Right   . SHOULDER ARTHROSCOPY W/ ROTATOR CUFF REPAIR Left 02/2006   "tear"  . TUBAL LIGATION      Vitals:   09/25/20 1117 09/25/20 1118  BP: (!) 100/45 (!) 71/39  Pulse: 61 63     Subjective Assessment - 09/25/20 1106    Subjective Sunday and Monday morning, I woke up and was shaking all over.  Couldn't walk without help of husband.  Today, it didn't happen; today is a better day.  Think it has something to do with my neck, but I'm not sure    Patient is accompained by: Family member   Husband   Pertinent History  PMH includes CHF, pacemaker placement 2017, cataract surgery, compression fracture, chronic back pain.    Patient Stated Goals Pt's goals for therapy are to -Unsure-this is just happening so fast.  Pt agrees to working to lessen fall risk and improve safe mobility.    Currently in Pain? No/denies                             [facility] Adult PT Treatment/Exercise - 09/25/20 0001      Transfers   Transfers Sit to Stand;Stand to Sit    Sit to Stand 4: Min guard;With upper extremity assist;From bed    Sit to Stand Details Verbal cues for sequencing;Verbal cues for technique    Sit to Stand Details (indicate cue type and reason) Cues for hand placement    Stand to Sit 4: Min assist;With upper extremity assist;Uncontrolled  descent;To bed    Comments x 2 reps in session sit<>stand at edge of mat.  Assisted pt into/out of bathroom for toileting, with min assist/HHA.      Ambulation/Gait   Ambulation/Gait Yes    Ambulation/Gait Assistance 4: Min guard;4: Min assist    Ambulation/Gait Assistance Details Short distance gait in and out of bathroom with min assist/therapist HHA    Ambulation Distance (Feet) 80 Feet   60, then pt needs to turn and sit at locked rollator   Assistive device 4-wheeled walker   UpWalker   Gait Pattern Step-through pattern;Decreased step length - right;Decreased step length - left;Festinating;Trunk flexed;Narrow base of support    Ambulation Surface Level;Indoor      Therapeutic Activites    Therapeutic Activities Other Therapeutic Activities    Other Therapeutic Activities Reviewed with pt and husband-tips to reduce freezing/tremor episodes discussed last visit.  Discussed about transport w/c and they are planning to purchase this week.  Discussed again benefit of use of w/c, including increased safety in home environemtn wtih pt's decreased tolerance for standing/gait prior to freezing/tremor/falling episodes.  Assessed sitting and standing BP measures, with seated BP 100/45 and standing 71/38.  Discussed pt's BP changes perhaps playing a role in her dizziness and near-fall episodes.  Pt very fatigued at end of session and PT recommends that pt follow up with her PCP or cardiologist regarding her ongoing symptoms, which are continueing to put her at risk of fall/injury.                  PT Education - 09/25/20 1512    Education Details See instructions regarding BP measures; recommend follow up with MD regarding BP measures and ongoing symptoms    Person(s) Educated Patient;Spouse    Methods Explanation;Handout    Comprehension Verbalized understanding               PT Long Term Goals - 09/10/20 1951      PT LONG TERM GOAL #1   Title Pt/husband will demo correct transfer  technique for sit<>stand from chair to [employer].  TARGET 10/05/2020    Time 4    Period Weeks    Status New      PT LONG TERM GOAL #2   Title Pt will stand at least 2 minutes with UE support with no episode of retropulsion, for improved standing to participate in ADLs.    Baseline retropulsion upon standing    Time 4    Period Weeks    Status New      PT LONG TERM GOAL #3  Title Pt will improve gait velocity to at least 1.5 ft/sec for improved gait efficiency and safety for household gait.    Time 4    Period Weeks      PT LONG TERM GOAL #4   Title Pt/husband will verbalize understnading of fall prevention in home environment.    Time 4    Period Weeks                 Plan - 09/25/20 1513    Clinical Impression Statement Continued to educate patient and husband in safety with transfers, freezing/tremor episodes.  Assessed pt's BP measures in standing and sitting with approx 30 mmHg drop in systolic BP after about 3 minutes of standing.  Pt becomes dizzy/"full" headed and needs assist to sit.  This sensation happens with stnading and gait in and out of session; by end of session she is fatigued.  Educated pt and husband to contact MD, as movement/tremor related symptoms are reportedly worsening, and to contact PCP/cardiologist regarding BP changes in today's session with continued excess fatigue with little activity.  This is limiting pt's progression wtih therapy and is putting patient at continued risk of falls.    Personal Factors and Comorbidities Comorbidity 3+    Comorbidities See full problem list, but of note:  CHF, pacemaker placement, chronic back pain, hx of compression fractures    Examination-Activity Limitations Locomotion Level;Transfers;Hygiene/Grooming;Toileting;Dressing;Stand    Examination-Participation Restrictions Meal Prep;Community Activity    Stability/Clinical Decision Making Unstable/Unpredictable    Rehab Potential Fair    PT Frequency 1x / week    PT  Duration 4 weeks   based on progress, will reassess for additional POC   PT Treatment/Interventions ADLs/Self Care Home Management;DME Instruction;Neuromuscular re-education;Balance training;Therapeutic exercise;Therapeutic activities;Functional mobility training;Gait training;Patient/family education;Wheelchair mobility training    PT Next Visit Plan Will wait to schedule further appointments-requesting pt/husband follow up with MD-neurologist, PCP, cardiologist due to medical issues that are limiting therapy progress and pt's participation level    Consulted and Agree with Plan of Care Patient;Family member/caregiver    Family Member Consulted Husband           Patient will benefit from skilled therapeutic intervention in order to improve the following deficits and impairments:  Abnormal gait,Difficulty walking,Decreased safety awareness,Decreased activity tolerance,Decreased balance,Decreased mobility,Decreased strength,Postural dysfunction  Visit Diagnosis: Other abnormalities of gait and mobility  Unsteadiness on feet     Problem List Patient Active Problem List   Diagnosis Date Noted  . Tremor of both hands 07/30/2020  . Left arm swelling 04/10/2020  . Aortic atherosclerosis ([place]) 03/30/2020  . Cellulitis of arm, left 03/30/2020  . Chronic thoracic back pain 02/03/2020  . Iron deficiency anemia 02/02/2020  . Near syncope 09/27/2019  . Hypertension 09/20/2019  . Acute pain of right knee 08/03/2019  . (HFpEF) heart failure with preserved ejection fraction ([place]) 08/02/2019  . Pacemaker 07/19/2019  . CKD (chronic kidney disease) 01/30/2019  . Lumbar spinal stenosis 01/10/2018  . Osteoarthritis of both knees 12/15/2017  . Epigastric pain 09/09/2017  . Neuralgia 09/09/2017  . Claudication of calf muscles ([place]) 11/22/2016  . DOE (dyspnea on exertion) 11/22/2016  . Hyperreflexia of lower extremity 07/16/2016  . Bilateral leg paresthesia 07/16/2016  . Carotid arterial disease  ([place]) 07/13/2016  . Poor balance 06/23/2016  . Weakness of both lower extremities 06/23/2016  . Sinus node dysfunction ([place]) 05/05/2016  . Right shoulder pain 03/14/2016  . Multinodular goiter (nontoxic) 02/21/2016  . Neck pain 02/15/2016  .  Prediabetes 12/17/2015  . Dysphagia 12/17/2015  . T8 vertebral fracture ([place]) 04/09/2015  . Overweight 10/09/2011  . Allergic rhinitis, cause unspecified   . GERD (gastroesophageal reflux disease) 02/05/2011  . Vitamin B 12 deficiency 04/17/2009  . Hyperlipidemia 04/17/2009  . Osteoporosis 04/17/2009  . ADENOCARCINOMA, BREAST, HX OF 04/17/2009  . CONSTIPATION 03/08/2009  . COLONIC POLYPS, HYPERPLASTIC, HX OF 03/06/2009    Courtany Mcmurphy W. 09/25/2020, 3:19 PM  Frazier Butt., PT   [facility] [address] [place], [place], [address] Phone: [phone]   Fax:  [phone]  Name: [name] MRN: [id number] Date of Birth: Nov 04, 1934

## 2020-09-25 NOTE — Telephone Encounter (Signed)
Can we bring patient in Friday at 1pm with orthostatics to discuss sx's and see if there is anything we can do or if need to go to other doc?

## 2020-09-25 NOTE — Patient Instructions (Signed)
Your blood pressure is dropping significantly from sitting (100/45) to standing positions (71/39), which may be contributing to dizziness/lightheadedness  -Before you stand up, make sure to do 5-10 ankle pumps and leg kicks  -Make sure to stand up slowly, and try to limit standing for no more than 1-2 minutes (more than that time seems to bring on dizziness and lightheadedness) -Try to use a transport chair or wheelchair to get from place to place in your apartment to limit possibility of falls during walking -Please contact your cardiologist to follow-up regarding your blood pressure and these episodes where you feel dizzy and pressure/fullness in your head.

## 2020-09-26 NOTE — Telephone Encounter (Signed)
Pt is sch for 09-27-20 with Tat

## 2020-09-26 NOTE — Progress Notes (Signed)
Assessment/Plan:   1.  Parkinsonism, with the differential still being between idiopathic PD and an atypical state  -According to physical therapy, she is having some episodes of retropulsion which potentially would favor an atypical state.  Husband states that she is falling in any direction  -Patient also having orthostasis (severely orthostatic in the office).  However, her Lasix was recently increased and she is also on metoprolol.  In addition, I recently started levodopa.  Tried to personally call Dr. Tanna Furry office, but after being on hold for 30 minutes and only getting a rotating recording, I had to move on.  I will send a staff message, and my medical assistant also left a message with cardiology to call the patient.  I also asked them to follow-up with Dr. Lovena Le in person (last time he saw her it was through telemedicine visit).  If the patient is not able to have her metoprolol or Lasix reduced, then perhaps we will need to add something like midodrine.  -Patient did have evidence of orthostatic tremor in the office today.  I really do not want to add any medication for that, because the treatment is generally benzodiazepines, which can increase risk for falls.  Ultimately, I told the patient that I want her to stay seated and use her transport chair in the home.   Subjective:   [name] was seen today in follow up for newly diagnosed parkinsonism.  Patient with her husband who supplements the history.  She is seen earlier than expected at the request of her physical therapist.  I have got notes from the physical therapist regarding retropulsion, orthostasis and near syncope.  My previous records were reviewed prior to todays visit as well as outside records available to me. Pts cardiology records are reviewed from January 18 (prior to when she saw me initially).  This was a telemedicine visit with Dr. Lovena Le.  He increased her Lasix at that visit because of her complaints of  shortness of breath.  Reports several falls.  [place] trying to sit on toilet yesterday and missed toilet.  Husband states that the R arm and leg were limp and he felt that the leg just crumbled under her.  Husband had some trouble getting her off of the floor.  With another fall, she fell straight backwards.   ALLERGIES:   Allergies  Allergen Reactions  . Statins     Lipitor caused hospitalization - - depletion of electrolytes, fever, nausea, loss of appetite, couldn't get out of bed  . Cymbalta [Duloxetine Hcl] Other (See Comments)    Dulled her too much, difficulty urinating, change in vision  . Fosamax [Alendronate Sodium] Other (See Comments)    Caused chronic issues swallowing  . Amitriptyline     hallucinations  . Lyrica [Pregabalin]     Unknown  . Neurontin [Gabapentin] Other (See Comments)    Dizziness and sedation (patient is tolerating in lower dose)  . Zanaflex [Tizanidine] Other (See Comments)    Could not sleep    CURRENT MEDICATIONS:  Outpatient Encounter Medications as of 09/27/2020  Medication Sig  . acetaminophen (TYLENOL) 500 MG tablet Take 500 mg by mouth 2 (two) times daily.  . carbidopa-levodopa (SINEMET IR) 25-100 MG tablet Take 1 tablet by mouth 3 (three) times daily. 7am/11am/4pm  . cholecalciferol (VITAMIN D) 1000 UNITS tablet Take 2,000 Units by mouth daily.   . Cyanocobalamin 1000 MCG TBCR Take 1,000 mcg by mouth daily.   [name] denosumab (PROLIA) 60  MG/ML SOSY injection Inject 60 mg into the skin every 6 (six) months.  . Diclofenac Sodium (PENNSAID) 2 % SOLN Apply topically.  . diclofenac Sodium (VOLTAREN) 1 % GEL Apply 2 g topically 4 (four) times daily.  [name] dicyclomine (BENTYL) 10 MG capsule Take 1 capsule (10 mg total) by mouth 3 (three) times daily before meals.  [name] escitalopram (LEXAPRO) 5 MG tablet Take 1 tablet (5 mg total) by mouth daily.  [name] ezetimibe (ZETIA) 10 MG tablet Take 1 tablet (10 mg total) by mouth daily.  . ferrous sulfate 325 (65 FE) MG tablet  Take 325 mg by mouth 3 (three) times a week.  . furosemide (LASIX) 40 MG tablet Take one tablet in the AM and 1/2 tablet after lunch.  . loratadine (CLARITIN) 10 MG tablet Take 10 mg by mouth daily as needed for allergies.  . metoprolol succinate (TOPROL XL) 25 MG 24 hr tablet Take 1 tablet (25 mg total) by mouth daily.  [name] omeprazole (PRILOSEC) 40 MG capsule TAKE ONE CAPSULE BY MOUTH TWICE A DAY  . Polyethyl Glycol-Propyl Glycol (LUBRICANT EYE DROPS) 0.4-0.3 % SOLN Place 1 drop into both eyes 3 (three) times daily as needed (dry/irritated eyes.).  [name] polyethylene glycol (MIRALAX / GLYCOLAX) packet Take 17 g by mouth daily as needed (constipation).  . potassium chloride (KLOR-CON) 10 MEQ tablet Take 2 tablets (20 mEq total) by mouth daily.  . sodium chloride (OCEAN) 0.65 % SOLN nasal spray Place 1 spray into both nostrils 4 (four) times daily as needed for congestion.   No facility-administered encounter medications on file as of 09/27/2020.    Objective:   PHYSICAL EXAMINATION:    VITALS:   Vitals:   09/27/20 1247  Weight: 153 lb (69.4 kg)  Height: 5\' 1"  (1.549 m)   Orthostatic VS for the past 24 hrs (Last 3 readings):  BP- Lying Pulse- Lying BP- Sitting Pulse- Sitting BP- Standing at 0 minutes Pulse- Standing at 0 minutes  09/27/20 1258 130/66 60 110/50 60 (!) 64/32 69    GEN:  The patient appears stated age and is in NAD. HEENT:  Normocephalic, atraumatic.  The mucous membranes are moist. The superficial temporal arteries are without ropiness or tenderness. CV:  RRR Lungs:  CTAB Neck/HEME:  There are no carotid bruits bilaterally.  Neurological examination:  Orientation: The patient is alert and oriented x3. Cranial nerves: There is good facial symmetry with facial hypomimia. The speech is fluent and clear. Soft palate rises symmetrically and there is no tongue deviation. Hearing is intact to conversational tone. Sensation: Sensation is intact to light touch throughout Motor:  Strength is at least antigravity x4.  Movement examination: Tone: There is normal tone in the upper and lower extremities Abnormal movements: No tremor seen or felt today. Coordination:  There is no significant decremation with RAM's (this is an improvement) Gait and Station: The patient pushes off of the walker and then has very significant tremor in both the legs and arms.  Once she moves with the walker, she is actually able to walk fairly well.   I have reviewed and interpreted the following labs independently    Chemistry      Component Value Date/Time   NA 138 08/30/2020 1449   K 4.4 08/30/2020 1449   CL 98 08/30/2020 1449   CO2 24 08/30/2020 1449   BUN 19 08/30/2020 1449   CREATININE 1.29 (H) 08/30/2020 1449   CREATININE 1.23 (H) 03/30/2020 1011      Component  Value Date/Time   CALCIUM 9.3 08/30/2020 1449   ALKPHOS 35 (L) 12/27/2019 0810   AST 11 03/30/2020 1011   ALT 8 03/30/2020 1011   BILITOT 0.4 03/30/2020 1011       Lab Results  Component Value Date   WBC 7.8 03/30/2020   HGB 12.0 03/30/2020   HCT 36.3 03/30/2020   MCV 98.1 03/30/2020   PLT 333 03/30/2020    Lab Results  Component Value Date   TSH 1.85 02/03/2020     Total time spent on today's visit was 35 minutes, including both face-to-face time and nonface-to-face time.  Time included that spent on review of records (prior notes available to me/labs/imaging if pertinent), discussing treatment and goals, answering patient's questions and coordinating care.  Cc:  [name], MD

## 2020-09-27 ENCOUNTER — Other Ambulatory Visit: Payer: Self-pay

## 2020-09-27 ENCOUNTER — Encounter: Payer: Self-pay | Admitting: Neurology

## 2020-09-27 ENCOUNTER — Ambulatory Visit (INDEPENDENT_AMBULATORY_CARE_PROVIDER_SITE_OTHER): Payer: Medicare Other | Admitting: Neurology

## 2020-09-27 VITALS — Ht 61.0 in | Wt 153.0 lb

## 2020-09-27 DIAGNOSIS — G903 Multi-system degeneration of the autonomic nervous system: Secondary | ICD-10-CM | POA: Diagnosis not present

## 2020-09-27 DIAGNOSIS — G20 Parkinson's disease: Secondary | ICD-10-CM | POA: Diagnosis not present

## 2020-09-27 DIAGNOSIS — G252 Other specified forms of tremor: Secondary | ICD-10-CM

## 2020-09-27 NOTE — Patient Instructions (Addendum)
1.  We recommend a follow up with Dr. Lovena Le to see if BP meds (metoprolol, possibly lasix) can be changed at all. 2.  Stay seated and use your transport chair

## 2020-09-28 ENCOUNTER — Telehealth: Payer: Self-pay | Admitting: Neurology

## 2020-09-28 NOTE — Telephone Encounter (Signed)
Patient notified and voiced understanding.

## 2020-09-28 NOTE — Telephone Encounter (Signed)
Tee, Dr. Lovena Le responded to my message yesterday and wanted [place] to relay to patient to stop her lasix and cut metoprolol in half and see how she does.  She has a f/u with him soon.

## 2020-10-02 ENCOUNTER — Other Ambulatory Visit: Payer: Self-pay

## 2020-10-02 ENCOUNTER — Ambulatory Visit: Payer: Medicare Other

## 2020-10-02 ENCOUNTER — Ambulatory Visit: Payer: Medicare Other | Admitting: Physical Therapy

## 2020-10-02 DIAGNOSIS — R471 Dysarthria and anarthria: Secondary | ICD-10-CM

## 2020-10-02 DIAGNOSIS — R29818 Other symptoms and signs involving the nervous system: Secondary | ICD-10-CM | POA: Diagnosis not present

## 2020-10-02 DIAGNOSIS — M6281 Muscle weakness (generalized): Secondary | ICD-10-CM | POA: Diagnosis not present

## 2020-10-02 DIAGNOSIS — R1310 Dysphagia, unspecified: Secondary | ICD-10-CM | POA: Diagnosis not present

## 2020-10-02 DIAGNOSIS — R2681 Unsteadiness on feet: Secondary | ICD-10-CM | POA: Diagnosis not present

## 2020-10-02 DIAGNOSIS — R2689 Other abnormalities of gait and mobility: Secondary | ICD-10-CM | POA: Diagnosis not present

## 2020-10-02 NOTE — Patient Instructions (Signed)
   Practice loud "hey!" 5 times - 3 times a day.  Whenever you talk, think about using a stronger voice.

## 2020-10-03 NOTE — Therapy (Signed)
[facility] [address] [place], [place], [address] Phone: [phone]   Fax:  [phone]  Speech Language Pathology Evaluation  Patient Details  Name: ARISBETH PURRINGTON MRN: [id number] Date of Birth: 1935/02/15 Referring Provider (SLP): [name], DO   Encounter Date: 10/02/2020   End of Session - 10/02/20 1920    Visit Number 1    Number of Visits 17    Date for SLP Re-Evaluation 12/31/20    SLP Start Time 1151    SLP Stop Time  1231    SLP Time Calculation (min) 40 min    Activity Tolerance Patient tolerated treatment well           Past Medical History:  Diagnosis Date  . Allergy   . Anemia   . Cancer of left breast ([place]) 1978  . CHF (congestive heart failure) ([place])   . Chronic thoracic back pain    "T8; fracture; 03/2015; no OR" (05/05/2016)  . GERD (gastroesophageal reflux disease)   . Heart murmur   . History of hiatal hernia   . Hyperlipidemia   . Hyperplastic colon polyp   . Hypertension   . IBS (irritable bowel syndrome)   . Multiple thyroid nodules   . Osteoporosis    T8 compression fx 03/2015   . Personal history of radiation therapy   . Presence of permanent cardiac pacemaker   . Vitamin B12 deficiency     Past Surgical History:  Procedure Laterality Date  . ANTERIOR CERVICAL DECOMP/DISCECTOMY FUSION  2008   C5-6  . AUGMENTATION MAMMAPLASTY    . BACK SURGERY    . BREAST SURGERY    . CARDIAC CATHETERIZATION  2001  . EP IMPLANTABLE DEVICE N/A 05/05/2016   Procedure: Pacemaker Implant;  Surgeon: Evans Lance, MD;  Location: [facility];  Service: Cardiovascular;  Laterality: N/A;  . EXCISIONAL HEMORRHOIDECTOMY  1984   With subsequent correction of surgery  . INSERT / REPLACE / REMOVE PACEMAKER    . INTRAVASCULAR PRESSURE WIRE/FFR STUDY N/A 10/07/2019   Procedure: INTRAVASCULAR PRESSURE WIRE/FFR STUDY;  Surgeon: Jettie Booze, MD;  Location: [facility];   Service: Cardiovascular;  Laterality: N/A;  . KNEE ARTHROSCOPY Left    "meniscus tear"  . LEFT HEART CATH AND CORONARY ANGIOGRAPHY N/A 10/07/2019   Procedure: LEFT HEART CATH AND CORONARY ANGIOGRAPHY;  Surgeon: Jettie Booze, MD;  Location: [facility];  Service: Cardiovascular;  Laterality: N/A;  . MASTECTOMY Left 1978  . PLACEMENT OF BREAST IMPLANTS Left 1981  . REDUCTION MAMMAPLASTY Right   . SHOULDER ARTHROSCOPY W/ ROTATOR CUFF REPAIR Left 02/2006   "tear"  . TUBAL LIGATION      There were no vitals filed for this visit.   Subjective Assessment - 10/02/20 1202    Subjective "I believe he saw you for the Parknson's too."    Patient is accompained by: Family member   husband Shanon Brow   Currently in Pain? No/denies              SLP Evaluation [facility] - 10/02/20 1202      SLP Visit Information   SLP Received On 10/02/20    Referring Provider (SLP) [name], DO    Onset Date Summer 2021    Medical Diagnosis Parkinson's disease/Parkinsonism      Subjective   Patient/Family Stated Goal Improvement in speech clarity/voice      General Information   HPI Pt with recent deconditioning due  to cancer on scalp/forehead, and ? orthostatic BP issues. Recent dx of Parkinson's disease and pt c/o raspy voice since last summer. ENT visit late January 2022 revealed everything WNL.    Mobility Status arrives in transportation chair      Balance Screen   Has the patient fallen in the past 6 months Yes    How many times? --   currently in PT     Prior Functional Status   Cognitive/Linguistic Baseline Within functional limits      Cognition   Overall Cognitive Status Within Functional Limits for tasks assessed      Auditory Comprehension   Overall Auditory Comprehension Appears within functional limits for tasks assessed      Verbal Expression   Overall Verbal Expression Appears within functional limits for tasks assessed      Oral Motor/Sensory Function   Overall Oral  Motor/Sensory Function Impaired    Labial ROM Reduced right;Reduced left    Labial Strength Reduced    Labial Coordination Reduced    Lingual ROM Reduced right;Reduced left    Lingual Strength Reduced    Lingual Coordination Reduced    Overall Oral Motor/Sensory Function "I get strangled - with pills, or just during the day on my saliva." Modifed barium swallow exam in August 2021 revealed "oral phase was characterized by timely bolus prep and propulsion on small boluses. Piecemeal swallow was noted with large and consecutive boluses. Trace lingual residue noted after large boluses of puree and solid textures. Premature spillage over the tongue base was noted across consistencies.   Pharyngeal swallow was characterized by trigger of the swallow reflex at the level of the vallecula on nectar thick, puree, solid and barium tablet. Thin liquids triggered at the pyriform sinus. Pt appeared to struggle to clear large and/or consecutive boluses, however, no penetration or aspiration was seen on any texture. Barium tablet was given with water, and was noted to pause in the vallecular sinus until a second bolus of water was taken. Barium tablet then cleared the pharynx. Esophageal sweep revealed it to be clear.    Pt went for esophagram following MBS. Following both studies, pt was provided with education regarding results, and was provided with written information regarding safe swallow precautions and management of esophageal dysmotility (given likelihood of presbyesophagus). No further ST intervention is recommended at this time."  Swallow Evaluation Recommendations  SLP Diet Recommendations: Dysphagia 3 (Mech soft) solids;Thin liquid  Liquid Administration via: Cup;Straw  Medication Administration: Whole meds with liquid  Supervision: Patient able to self feed  Compensations: Minimize environmental distractions;Slow rate;Small sips/bites;Follow solids with liquid  Postural Changes: Remain semi-upright  after after feeds/meals (Comment);Seated upright at 90 degrees  Oral Care Recommendations: Oral care BID      Motor Speech   Phonation Breathy;Hoarse;Low vocal intensity   average low-middle 60s dB with simple Q and A and in simple conversation. Loud "hey" low-mid 80s dB   Phonation Impaired   simple Q and A pt raised voice to [facility]                          SLP Education - 10/02/20 1919    Education Details loud "hey", need for stronger voice necessary for WNL voice volume    Person(s) Educated Patient;Spouse    Methods Explanation;Demonstration;Verbal cues;Handout    Comprehension Verbalized understanding;Returned demonstration;Verbal cues required;Need further instruction            SLP Short Term Goals -  10/03/20 1253      SLP [place] #1   Title pt will produce loud /a/ or "hey!" with at least mid-upper 80s dB average over three sessions    Time 4    Period Weeks   or 9 sessions for all STGs   Status New      SLP SHORT TERM GOAL #2   Title pt will generate abdominal breathing 80% of the time at rest for 5 minutes in 2 sessions    Time 4    Period Weeks    Status New      SLP SHORT TERM GOAL #3   Title pt will generate abdominal breathing 80% of the time when answering questions from SLP over 2 sessions    Time 4    Period Weeks    Status New            SLP Long Term Goals - 10/03/20 1255      SLP LONG TERM GOAL #1   Title pt will generate abdominal breathing 80% of the time in 8 minutes simple-mod complex conversation with SLP over 2 sessions    Time 8    Period Weeks   or 17 total sessions, for all LTGs   Status New      SLP LONG TERM GOAL #2   Title Pt will produce 8 minutes simple-mod complex conversation with WNL volume over three sessions    Time 8    Period Weeks    Status New      SLP LONG TERM GOAL #3   Title Pt will produce 8 minutes mod complex conversation with WNL volume over three sessions    Time 8    Period Weeks     Status New      SLP LONG TERM GOAL #4   Title pt will undergo objective swallowing assessment if deemed clinically necessary-signature of DO on this plan of care will be understood as agreement with this judgment by SLP    Time 8    Period Weeks    Status New            Plan - 10/03/20 1249    Clinical Impression Statement Pt presents today with 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 Pt reports she chokes on saliva and with meds, but denying overt s/s aspiration PNA. See above for results of MBSS in August 2021. Pt may require objective swallow eval during this therapy course (MBSS, FEES). Pt talked at suboptimal dB (low-mid 60s dB average) until SLP did loud "hey" with pt. Pt was then told to use same effort ("strong speech", "louder speech") with quesion and answer task, and pt improved loudness to lower 70s dB average. Conversation was produced with average mid-60s dB. SLP used tactile cue of hand on abdominal musculature for "strong speech" for visual feedback for pt. SLP believes pt will benefit from skilled ST targeting incr'd volume and greater usage of abdomoinal breathing in order to improve communicative effectiveness, and if necessary swallow therapy to improve swallow strength and reduce risk for aspiration.    Speech Therapy Frequency 2x / week    Duration 8 weeks   or 17 sessions total   Treatment/Interventions Aspiration precaution training;Pharyngeal strengthening exercises;Diet toleration management by SLP;Trials of upgraded texture/liquids;Internal/external aids;Patient/family education;Compensatory strategies;SLP instruction and feedback;Functional tasks    Potential to Achieve Goals Good    Consulted and Agree with Plan of Care Patient  Patient will benefit from skilled therapeutic intervention in order to improve the following deficits and impairments:   Dysarthria and anarthria  Dysphagia, unspecified type    Problem List Patient  Active Problem List   Diagnosis Date Noted  . Tremor of both hands 07/30/2020  . Left arm swelling 04/10/2020  . Aortic atherosclerosis ([place]) 03/30/2020  . Cellulitis of arm, left 03/30/2020  . Chronic thoracic back pain 02/03/2020  . Iron deficiency anemia 02/02/2020  . Near syncope 09/27/2019  . Hypertension 09/20/2019  . Acute pain of right knee 08/03/2019  . (HFpEF) heart failure with preserved ejection fraction ([place]) 08/02/2019  . Pacemaker 07/19/2019  . CKD (chronic kidney disease) 01/30/2019  . Lumbar spinal stenosis 01/10/2018  . Osteoarthritis of both knees 12/15/2017  . Epigastric pain 09/09/2017  . Neuralgia 09/09/2017  . Claudication of calf muscles ([place]) 11/22/2016  . DOE (dyspnea on exertion) 11/22/2016  . Hyperreflexia of lower extremity 07/16/2016  . Bilateral leg paresthesia 07/16/2016  . Carotid arterial disease ([place]) 07/13/2016  . Poor balance 06/23/2016  . Weakness of both lower extremities 06/23/2016  . Sinus node dysfunction ([place]) 05/05/2016  . Right shoulder pain 03/14/2016  . Multinodular goiter (nontoxic) 02/21/2016  . Neck pain 02/15/2016  . Prediabetes 12/17/2015  . Dysphagia 12/17/2015  . T8 vertebral fracture ([place]) 04/09/2015  . Overweight 10/09/2011  . Allergic rhinitis, cause unspecified   . GERD (gastroesophageal reflux disease) 02/05/2011  . Vitamin B 12 deficiency 04/17/2009  . Hyperlipidemia 04/17/2009  . Osteoporosis 04/17/2009  . ADENOCARCINOMA, BREAST, HX OF 04/17/2009  . CONSTIPATION 03/08/2009  . COLONIC POLYPS, HYPERPLASTIC, HX OF 03/06/2009    [facility] ,MS, CCC-SLP  10/03/2020, 12:59 PM  [facility] [address] [place], [place], [address] Phone: [phone]   Fax:  [phone]  Name: JURNEY OVERACKER MRN: [id number] Date of Birth: 07-16-1935

## 2020-10-04 ENCOUNTER — Telehealth: Payer: Self-pay | Admitting: Pharmacist

## 2020-10-04 ENCOUNTER — Ambulatory Visit: Payer: Medicare Other | Admitting: Neurology

## 2020-10-04 NOTE — Progress Notes (Addendum)
Chronic Care Management Pharmacy Assistant   Name: JAILINE LIEDER  MRN: [id number] DOB: 1935-06-20   Reason for Encounter: Medication Review   Recent office visits:    Recent consult visits:  03/11 Cardiology: continue to hold lasix, agree with reducing metoprolol to 1/2 tablet daily.   Hospital visits:  None in previous 6 months  Medications: Outpatient Encounter Medications as of 10/04/2020  Medication Sig   acetaminophen (TYLENOL) 500 MG tablet Take 500 mg by mouth 2 (two) times daily.   carbidopa-levodopa (SINEMET IR) 25-100 MG tablet Take 1 tablet by mouth 3 (three) times daily. 7am/11am/4pm   cholecalciferol (VITAMIN D) 1000 UNITS tablet Take 2,000 Units by mouth daily.    Cyanocobalamin 1000 MCG TBCR Take 1,000 mcg by mouth daily.    denosumab (PROLIA) 60 MG/ML SOSY injection Inject 60 mg into the skin every 6 (six) months.   Diclofenac Sodium (PENNSAID) 2 % SOLN Apply topically.   diclofenac Sodium (VOLTAREN) 1 % GEL Apply 2 g topically 4 (four) times daily.   dicyclomine (BENTYL) 10 MG capsule Take 1 capsule (10 mg total) by mouth 3 (three) times daily before meals.   escitalopram (LEXAPRO) 5 MG tablet Take 1 tablet (5 mg total) by mouth daily.   ezetimibe (ZETIA) 10 MG tablet Take 1 tablet (10 mg total) by mouth daily.   ferrous sulfate 325 (65 FE) MG tablet Take 325 mg by mouth 3 (three) times a week.   furosemide (LASIX) 40 MG tablet Take one tablet in the AM and 1/2 tablet after lunch. (Patient taking differently: Take one tablet in the AM and 1/2 tablet after lunch.  ON HOLD PER DR TAYLOR  UNTIL FOLLOW UP APPT-TY, CMA 3.4.22)   loratadine (CLARITIN) 10 MG tablet Take 10 mg by mouth daily as needed for allergies.   metoprolol succinate (TOPROL XL) 25 MG 24 hr tablet Take 1 tablet (25 mg total) by mouth daily. (Patient taking differently: Take 25 mg by mouth daily. PER DR Lovena Le TAKE HALF TAB UNTIL FOLLOW UP APPT-TY, CMA 3.4.22)   omeprazole (PRILOSEC) 40 MG  capsule TAKE ONE CAPSULE BY MOUTH TWICE A DAY   Polyethyl Glycol-Propyl Glycol (LUBRICANT EYE DROPS) 0.4-0.3 % SOLN Place 1 drop into both eyes 3 (three) times daily as needed (dry/irritated eyes.).   polyethylene glycol (MIRALAX / GLYCOLAX) packet Take 17 g by mouth daily as needed (constipation).   potassium chloride (KLOR-CON) 10 MEQ tablet Take 2 tablets (20 mEq total) by mouth daily.   sodium chloride (OCEAN) 0.65 % SOLN nasal spray Place 1 spray into both nostrils 4 (four) times daily as needed for congestion.   No facility-administered encounter medications on file as of 10/04/2020.    Reviewed chart for medication changes ahead of medication coordination call.  No OVs, Consults, or hospital visits since last care coordination call/Pharmacist visit. (If appropriate, list visit date, provider name)  No medication changes indicated OR if recent visit, treatment plan here.  BP Readings from Last 3 Encounters:  09/25/20 (!) 71/39  08/23/20 (!) 118/47  08/14/20 (!) 121/53    Lab Results  Component Value Date   HGBA1C 5.9 (H) 02/03/2020     Patient obtains medications through Vials  90 Days   Last adherence delivery included:  Ezetimibe 10 mg Tab Take one tab by mouth    Patient is due for next adherence delivery on: 10/16/20 Called patient and reviewed medications and coordinated delivery.  This delivery to include: Omeprazole 40 mg cap Take  one capsule by mouth every morning and take one capsule by mouth every evening Escitalopram 5 mg tab Take one tab by mouth once daily Metoprolol Succinate 25 mg Take 1/2 by mouth everyday at bedtime   Patient needs refills for: Metoprolol Succinate 25 mg - Requested  Confirmed delivery date of 10/16/20, advised patient that pharmacy will contact them the morning of delivery.   Georgiana Shore ,[place] Pharmacist Assistant [phone]

## 2020-10-05 ENCOUNTER — Ambulatory Visit (INDEPENDENT_AMBULATORY_CARE_PROVIDER_SITE_OTHER): Payer: Medicare Other | Admitting: Nurse Practitioner

## 2020-10-05 ENCOUNTER — Encounter: Payer: Self-pay | Admitting: Nurse Practitioner

## 2020-10-05 ENCOUNTER — Other Ambulatory Visit: Payer: Self-pay

## 2020-10-05 VITALS — BP 140/56 | HR 62 | Ht 61.0 in | Wt 157.0 lb

## 2020-10-05 DIAGNOSIS — I5030 Unspecified diastolic (congestive) heart failure: Secondary | ICD-10-CM | POA: Diagnosis not present

## 2020-10-05 DIAGNOSIS — I495 Sick sinus syndrome: Secondary | ICD-10-CM | POA: Diagnosis not present

## 2020-10-05 NOTE — Progress Notes (Signed)
Electrophysiology Office Note Date: 10/05/2020  ID:  [name], DOB 13-Jun-1935, MRN [id number]  PCP: [name], MD Electrophysiologist: Lovena Le  CC: Pacemaker follow-up  [name] is a 86 y.o. female seen today for Dr Lovena Le.  She presents today for routine electrophysiology followup.  Since last being seen in our clinic, the patient reports doing reasonably well.  She continues to struggle with Parkinsons and orthostasis. She has been seen by Dr Tat recently.  Lasix was discontinued and Metoprolol dose decreased with some improvement in symptoms.    She denies chest pain, shortness of breath, palpitations.    Past Medical History:  Diagnosis Date  . Allergy   . Anemia   . Cancer of left breast ([place]) 1978  . CHF (congestive heart failure) ([place])   . Chronic thoracic back pain    "T8; fracture; 03/2015; no OR" (05/05/2016)  . GERD (gastroesophageal reflux disease)   . Heart murmur   . History of hiatal hernia   . Hyperlipidemia   . Hyperplastic colon polyp   . Hypertension   . IBS (irritable bowel syndrome)   . Multiple thyroid nodules   . Osteoporosis    T8 compression fx 03/2015   . Personal history of radiation therapy   . Presence of permanent cardiac pacemaker   . Vitamin B12 deficiency    Past Surgical History:  Procedure Laterality Date  . ANTERIOR CERVICAL DECOMP/DISCECTOMY FUSION  2008   C5-6  . AUGMENTATION MAMMAPLASTY    . BACK SURGERY    . BREAST SURGERY    . CARDIAC CATHETERIZATION  2001  . EP IMPLANTABLE DEVICE N/A 05/05/2016   Procedure: Pacemaker Implant;  Surgeon: Evans Lance, MD;  Location: [facility];  Service: Cardiovascular;  Laterality: N/A;  . EXCISIONAL HEMORRHOIDECTOMY  1984   With subsequent correction of surgery  . INSERT / REPLACE / REMOVE PACEMAKER    . INTRAVASCULAR PRESSURE WIRE/FFR STUDY N/A 10/07/2019   Procedure: INTRAVASCULAR PRESSURE WIRE/FFR STUDY;  Surgeon: Jettie Booze, MD;  Location: [facility];  Service: Cardiovascular;  Laterality: N/A;  . KNEE ARTHROSCOPY Left    "meniscus tear"  . LEFT HEART CATH AND CORONARY ANGIOGRAPHY N/A 10/07/2019   Procedure: LEFT HEART CATH AND CORONARY ANGIOGRAPHY;  Surgeon: Jettie Booze, MD;  Location: [facility];  Service: Cardiovascular;  Laterality: N/A;  . MASTECTOMY Left 1978  . PLACEMENT OF BREAST IMPLANTS Left 1981  . REDUCTION MAMMAPLASTY Right   . SHOULDER ARTHROSCOPY W/ ROTATOR CUFF REPAIR Left 02/2006   "tear"  . TUBAL LIGATION      Current Outpatient Medications  Medication Sig Dispense Refill  . acetaminophen (TYLENOL) 500 MG tablet Take 500 mg by mouth 2 (two) times daily.    . carbidopa-levodopa (SINEMET IR) 25-100 MG tablet Take 1 tablet by mouth 3 (three) times daily. 7am/11am/4pm 270 tablet 1  . cholecalciferol (VITAMIN D) 1000 UNITS tablet Take 2,000 Units by mouth daily.     . Cyanocobalamin 1000 MCG TBCR Take 1,000 mcg by mouth daily.     [name] denosumab (PROLIA) 60 MG/ML SOSY injection Inject 60 mg into the skin every 6 (six) months.    . Diclofenac Sodium (PENNSAID) 2 % SOLN Apply topically.    . diclofenac Sodium (VOLTAREN) 1 % GEL Apply 2 g topically 4 (four) times daily.    [name] dicyclomine (BENTYL) 10 MG capsule Take 1 capsule (10 mg total) by mouth 3 (three) times daily  before meals. 90 capsule 11  . escitalopram (LEXAPRO) 5 MG tablet Take 1 tablet (5 mg total) by mouth daily. 30 tablet 5  . ezetimibe (ZETIA) 10 MG tablet Take 1 tablet (10 mg total) by mouth daily. 90 tablet 3  . ferrous sulfate 325 (65 FE) MG tablet Take 325 mg by mouth 3 (three) times a week.    . furosemide (LASIX) 40 MG tablet Take one tablet in the AM and 1/2 tablet after lunch. (Patient taking differently: Take one tablet in the AM and 1/2 tablet after lunch.  ON HOLD PER DR TAYLOR  UNTIL FOLLOW UP APPT-TY, CMA 3.4.22) 90 tablet 3  . loratadine (CLARITIN) 10 MG tablet Take 10 mg by mouth daily as needed for allergies.    .  metoprolol succinate (TOPROL XL) 25 MG 24 hr tablet Take 1 tablet (25 mg total) by mouth daily. (Patient taking differently: Take 25 mg by mouth daily. PER DR Lovena Le TAKE HALF TAB UNTIL FOLLOW UP APPT-TY, CMA 3.4.22) 90 tablet 2  . omeprazole (PRILOSEC) 40 MG capsule TAKE ONE CAPSULE BY MOUTH TWICE A DAY 60 capsule 11  . Polyethyl Glycol-Propyl Glycol (LUBRICANT EYE DROPS) 0.4-0.3 % SOLN Place 1 drop into both eyes 3 (three) times daily as needed (dry/irritated eyes.).    [name] polyethylene glycol (MIRALAX / GLYCOLAX) packet Take 17 g by mouth daily as needed (constipation).    . potassium chloride (KLOR-CON) 10 MEQ tablet Take 2 tablets (20 mEq total) by mouth daily. 180 tablet 3  . sodium chloride (OCEAN) 0.65 % SOLN nasal spray Place 1 spray into both nostrils 4 (four) times daily as needed for congestion.     No current facility-administered medications for this visit.    Allergies:   Statins, Cymbalta [duloxetine hcl], Fosamax [alendronate sodium], Amitriptyline, Lyrica [pregabalin], Neurontin [gabapentin], and Zanaflex [tizanidine]   Social History: Social History   Socioeconomic History  . Marital status: Married    Spouse name: Not on file  . Number of children: 2  . Years of education: Not on file  . Highest education level: Not on file  Occupational History  . Occupation: retired  Tobacco Use  . Smoking status: Never Smoker  . Smokeless tobacco: Never Used  Vaping Use  . Vaping Use: Never used  Substance and Sexual Activity  . Alcohol use: No    Alcohol/week: 0.0 standard drinks  . Drug use: No  . Sexual activity: Not Currently  Other Topics Concern  . Not on file  Social History Narrative   Housewife, Lives with spouse, 2 children   Social Determinants of Health   Financial Resource Strain: Medium Risk  . Difficulty of Paying Living Expenses: Somewhat hard  Food Insecurity: Not on file  Transportation Needs: Not on file  Physical Activity: Not on file  Stress: Not  on file  Social Connections: Not on file  Intimate Partner Violence: Not on file    Family History: Family History  Problem Relation Age of Onset  . Stroke Sister   . Diabetes Brother   . Breast cancer Maternal Aunt   . Atrial fibrillation Sister   . Colon cancer Neg Hx   . Stomach cancer Neg Hx      Review of Systems: All other systems reviewed and are otherwise negative except as noted above.   Physical Exam: VS:  BP (!) 140/56   Pulse 62   Ht 5\' 1"  (1.549 m)   Wt 157 lb (71.2 kg)   SpO2 98%  BMI 29.66 kg/m  , BMI Body mass index is 29.66 kg/m.  GEN- The patient is elderly appearing, alert and oriented x 3 today.   HEENT: normocephalic, atraumatic; sclera clear, conjunctiva pink; hearing intact; oropharynx clear; neck supple  Lungs- Clear to ausculation bilaterally, normal work of breathing.  No wheezes, rales, rhonchi Heart- Regular rate and rhythm   GI- soft, non-tender, non-distended, bowel sounds present  Extremities- no clubbing, cyanosis, or edema  MS- no significant deformity or atrophy Skin- warm and dry, no rash or lesion; PPM pocket well healed Psych- euthymic mood, full affect Neuro- strength and sensation are intact  PPM Interrogation- reviewed in detail today,  See PACEART report  EKG:  EKG is not ordered today.  Recent Labs: 02/03/2020: TSH 1.85 03/30/2020: ALT 8; Hemoglobin 12.0; Platelets 333 08/30/2020: BUN 19; Creatinine, Ser 1.29; Potassium 4.4; Sodium 138   Wt Readings from Last 3 Encounters:  10/05/20 157 lb (71.2 kg)  09/27/20 153 lb (69.4 kg)  08/23/20 157 lb (71.2 kg)     Other studies Reviewed: Additional studies/ records that were reviewed today include:Dr Tat's office notes Assessment and Plan:  1.  Symptomatic bradycardia Normal PPM function See Pace Art report No changes today  2.  Orthostasis/ Parkinsons Agree with holding Lasix and decreased metoprolol dose We could consider addition of Midodrine, but would hold off for  now with improvement in symptoms. If addition of Midodrine would allow Dr Tat more medication options for Parkinsons this would be reasonable.  3. Chronic diastolic heart failure Stable No change required today    Current medicines are reviewed at length with the patient today.   The patient does not have concerns regarding her medicines.  The following changes were made today:  none  Labs/ tests ordered today include: none No orders of the defined types were placed in this encounter.    Disposition:   Follow up with Dr Lovena Le 6 months      Signed, Chanetta Marshall, NP 10/05/2020 11:18 AM  [place] [address] [phone] (office) [phone] (fax)

## 2020-10-05 NOTE — Patient Instructions (Addendum)
Medication Instructions:   Your physician recommends that you continue on your current medications as directed. Please refer to the Current Medication list given to you today.  *If you need a refill on your cardiac medications before your next appointment, please call your pharmacy*   Lab Work: TODAY: BMET, CBC, MAG  If you have labs (blood work) drawn today and your tests are completely normal, you will receive your results only by: [name] MyChart Message (if you have MyChart) OR . A paper copy in the mail If you have any lab test that is abnormal or we need to change your treatment, we will call you to review the results.   Follow-Up: At [facility], you and your health needs are our priority.  As part of our continuing mission to provide you with exceptional heart care, we have created designated Provider Care Teams.  These Care Teams include your primary Cardiologist (physician) and Advanced Practice Providers (APPs -  Physician Assistants and Nurse Practitioners) who all work together to provide you with the care you need, when you need it.  Your next appointment:   4 month(s)  The format for your next appointment:   In Person  Provider:   You may see Crissie Sickles, MD or one of the following Advanced Practice Providers on your designated Care Team:    Chanetta Marshall, NP  Tommye Standard, PA-C  Le[facility] "[facility]" [place], [place]

## 2020-10-06 LAB — BASIC METABOLIC PANEL
BUN/Creatinine Ratio: 13 (ref 12–28)
BUN: 12 mg/dL (ref 8–27)
CO2: 21 mmol/L (ref 20–29)
Calcium: 8.5 mg/dL — ABNORMAL LOW (ref 8.7–10.3)
Chloride: 101 mmol/L (ref 96–106)
Creatinine, Ser: 0.92 mg/dL (ref 0.57–1.00)
Glucose: 87 mg/dL (ref 65–99)
Potassium: 4.9 mmol/L (ref 3.5–5.2)
Sodium: 135 mmol/L (ref 134–144)
eGFR: 62 mL/min/{1.73_m2} (ref >59)

## 2020-10-06 LAB — CBC
Hematocrit: 31.5 % — ABNORMAL LOW (ref 34.0–46.6)
Hemoglobin: 10.8 g/dL — ABNORMAL LOW (ref 11.1–15.9)
MCH: 33 pg (ref 26.6–33.0)
MCHC: 34.3 g/dL (ref 31.5–35.7)
MCV: 96 fL (ref 79–97)
Platelets: 320 10*3/uL (ref 150–450)
RBC: 3.27 x10E6/uL — ABNORMAL LOW (ref 3.77–5.28)
RDW: 12.9 % (ref 11.7–15.4)
WBC: 6.2 10*3/uL (ref 3.4–10.8)

## 2020-10-06 LAB — MAGNESIUM: Magnesium: 2.3 mg/dL (ref 1.6–2.3)

## 2020-10-08 ENCOUNTER — Telehealth: Payer: Self-pay | Admitting: Physical Therapy

## 2020-10-08 ENCOUNTER — Encounter: Payer: Self-pay | Admitting: Physical Therapy

## 2020-10-08 ENCOUNTER — Ambulatory Visit: Payer: Medicare Other | Admitting: Physical Therapy

## 2020-10-08 ENCOUNTER — Other Ambulatory Visit: Payer: Self-pay

## 2020-10-08 ENCOUNTER — Ambulatory Visit: Payer: Medicare Other

## 2020-10-08 VITALS — BP 85/36 | HR 62

## 2020-10-08 DIAGNOSIS — M6281 Muscle weakness (generalized): Secondary | ICD-10-CM

## 2020-10-08 DIAGNOSIS — R2689 Other abnormalities of gait and mobility: Secondary | ICD-10-CM | POA: Diagnosis not present

## 2020-10-08 DIAGNOSIS — R29818 Other symptoms and signs involving the nervous system: Secondary | ICD-10-CM

## 2020-10-08 DIAGNOSIS — R471 Dysarthria and anarthria: Secondary | ICD-10-CM | POA: Diagnosis not present

## 2020-10-08 DIAGNOSIS — R2681 Unsteadiness on feet: Secondary | ICD-10-CM

## 2020-10-08 DIAGNOSIS — R1310 Dysphagia, unspecified: Secondary | ICD-10-CM | POA: Diagnosis not present

## 2020-10-08 NOTE — Therapy (Signed)
[facility] [address] [place], [place], [address] Phone: [phone]   Fax:  [phone]  Physical Therapy Treatment  Patient Details  Name: [name] MRN: [id number] Date of Birth: 1934-09-14 Referring Provider (PT): Tat, Wells Guiles   Encounter Date: 10/08/2020   PT End of Session - 10/08/20 1640    Visit Number 4    Number of Visits 5    Date for PT Re-Evaluation 11/09/20    Authorization Type Medicare/AARP    PT Start Time 3094    PT Stop Time 1401    PT Time Calculation (min) 44 min    Equipment Utilized During Treatment Gait belt    Activity Tolerance Patient limited by fatigue   Fatigue/stress bring on bilateral UE and BLE tremors/?freezing episodes   Behavior During Therapy Anxious;Flat affect           Past Medical History:  Diagnosis Date  . Allergy   . Anemia   . Cancer of left breast ([place]) 1978  . CHF (congestive heart failure) ([place])   . Chronic thoracic back pain    "T8; fracture; 03/2015; no OR" (05/05/2016)  . GERD (gastroesophageal reflux disease)   . Heart murmur   . History of hiatal hernia   . Hyperlipidemia   . Hyperplastic colon polyp   . Hypertension   . IBS (irritable bowel syndrome)   . Multiple thyroid nodules   . Osteoporosis    T8 compression fx 03/2015   . Personal history of radiation therapy   . Presence of permanent cardiac pacemaker   . Vitamin B12 deficiency     Past Surgical History:  Procedure Laterality Date  . ANTERIOR CERVICAL DECOMP/DISCECTOMY FUSION  2008   C5-6  . AUGMENTATION MAMMAPLASTY    . BACK SURGERY    . BREAST SURGERY    . CARDIAC CATHETERIZATION  2001  . EP IMPLANTABLE DEVICE N/A 05/05/2016   Procedure: Pacemaker Implant;  Surgeon: Evans Lance, MD;  Location: [facility];  Service: Cardiovascular;  Laterality: N/A;  . EXCISIONAL HEMORRHOIDECTOMY  1984   With subsequent correction of surgery  . INSERT / REPLACE / REMOVE PACEMAKER     . INTRAVASCULAR PRESSURE WIRE/FFR STUDY N/A 10/07/2019   Procedure: INTRAVASCULAR PRESSURE WIRE/FFR STUDY;  Surgeon: Jettie Booze, MD;  Location: [facility];  Service: Cardiovascular;  Laterality: N/A;  . KNEE ARTHROSCOPY Left    "meniscus tear"  . LEFT HEART CATH AND CORONARY ANGIOGRAPHY N/A 10/07/2019   Procedure: LEFT HEART CATH AND CORONARY ANGIOGRAPHY;  Surgeon: Jettie Booze, MD;  Location: [facility];  Service: Cardiovascular;  Laterality: N/A;  . MASTECTOMY Left 1978  . PLACEMENT OF BREAST IMPLANTS Left 1981  . REDUCTION MAMMAPLASTY Right   . SHOULDER ARTHROSCOPY W/ ROTATOR CUFF REPAIR Left 02/2006   "tear"  . TUBAL LIGATION      Vitals:   10/08/20 1328 10/08/20 1335 10/08/20 1354  BP: (!) 131/47 (!) 76/35 (!) 85/36  Pulse: 63 62 62     Subjective Assessment - 10/08/20 1319    Subjective Saw Dr. Carles Collet and the cardiologist. Lasix was discontinued and Metoprolol dose decreased with some improvement in symptoms. Reports BP has been better at home. Comes today in a transport chair (after Dr. Carles Collet and primary PT recommendation). Reports the shaking is a lot worse today. Wasn't as bad yesterday.    Patient is accompained by: Family member   Husband   Pertinent History PMH  includes CHF, pacemaker placement 2017, cataract surgery, compression fracture, chronic back pain.    Patient Stated Goals Pt's goals for therapy are to -Unsure-this is just happening so fast.  Pt agrees to working to lessen fall risk and improve safe mobility.    Currently in Pain? No/denies                             [facility] Adult PT Treatment/Exercise - 10/08/20 0001      Transfers   Transfers Sit to Stand;Stand to Sit    Sit to Stand 4: Min guard;With upper extremity assist;From bed;4: Min assist    Sit to Stand Details Verbal cues for sequencing;Verbal cues for technique    Sit to Stand Details (indicate cue type and reason) Cues for scooting, for hand placement  to push from mat, foot placement, increased forward lean, then reach for walker handles upon standing.    Five time sit to stand comments  x4 reps total throuhgout session. pt demonstrates incr BLE tremors initially in standing, needing assist for balance and to place arms onto UpWalker    Stand to Sit 4: Min assist;With upper extremity assist;Uncontrolled descent;To bed    Stand to Sit Details assist for controlled descent to mat table/transport chair    Comments pt performing stand step transfer from transport chair > mat table with HHA and min A.      Ambulation/Gait   Ambulation/Gait Yes    Ambulation/Gait Assistance 4: Min guard;4: Min assist    Ambulation/Gait Assistance Details at end of session pt ambulating a small distance from mat table for PT tech to get transport chair behind patient. pt initially with incr full body tremors when standing and getting situated to [employer], decr when taking a few steps for gait    Ambulation Distance (Feet) 10 Feet    Assistive device 4-wheeled walker;Other (Comment)   UpWalker   Gait Pattern Step-through pattern;Decreased step length - right;Decreased step length - left;Festinating;Trunk flexed;Narrow base of support    Ambulation Surface Level;Indoor      Therapeutic Activites    Therapeutic Activities Other Therapeutic Activities    Other Therapeutic Activities Assessed pt's BP in sitting and standing with pt's systolic BP dropping significantly in standing (see vitals) and diastolic values remaining low. Pt reports feeling a heavy pressure in her head. PT to send message to pt's cardiologist regarding these values today (after medications have been adjusted) as it limits her ability to participate in therapist and puts pt at an incr risk for falls. Discussed with pt that with having this low BP making sure that she is drinking adequate water and that prior to standing activity to perform seated ankle pumps/marching to get blood circulating, and to  take her time in standing before walking.      Exercises   Exercises Other Exercises    Other Exercises  Seated leg exercises at edge of mat:  marching in place x 10, LAQ x 10 reps, ankle pumps-heel raises x 10, toe raises x 10. seated hip flexion (keeping spine straight) to upright posture x10 reps with verbal and tactile cues for scapular retraction                  PT Education - 10/08/20 1640    Education Details see TA.    Person(s) Educated Patient;Spouse    Methods Explanation    Comprehension Verbalized understanding  PT Long Term Goals - 10/08/20 1644      PT LONG TERM GOAL #1   Title Pt/husband will demo correct transfer technique for sit<>stand from chair to [employer].  TARGET 10/05/2020    Baseline needs min guard and reminder cues on proper technique to stand min A for safety due to uncontrolled descent and pt reporting her head feels heavy in standing.    Time 4    Period Weeks    Status Not Met      PT LONG TERM GOAL #2   Title Pt will stand at least 2 minutes with UE support with no episode of retropulsion, for improved standing to participate in ADLs.    Baseline retropulsion upon standing    Time 4    Period Weeks    Status New      PT LONG TERM GOAL #3   Title Pt will improve gait velocity to at least 1.5 ft/sec for improved gait efficiency and safety for household gait.    Baseline unable to assess on 10/08/20 - pt with signficant drop in BP in standing    Time 4    Period Weeks    Status On-going      PT LONG TERM GOAL #4   Title Pt/husband will verbalize understnading of fall prevention in home environment.    Time 4    Period Weeks                 Plan - 10/08/20 1641    Clinical Impression Statement Pt saw cardiologist and neurologist last week and had BP medication adjusted due to orthostatics (pt reporting change in meds started on Saturday). Pt with a significant drop in systolic BP today in standing (see vitals)  and low diastolic value throughout. Pt reports head feels heavy and full when standing. Needs assist to sit down from standing due to uncontrolled descent. PT to reach out to cardiologist to make them aware of BP values this session. BP issues are limiting pt's ability to participate in therapy and puts pt at an incr risk for falls.    Personal Factors and Comorbidities Comorbidity 3+    Comorbidities See full problem list, but of note:  CHF, pacemaker placement, chronic back pain, hx of compression fractures    Examination-Activity Limitations Locomotion Level;Transfers;Hygiene/Grooming;Toileting;Dressing;Stand    Examination-Participation Restrictions Meal Prep;Community Activity    Stability/Clinical Decision Making Unstable/Unpredictable    Rehab Potential Fair    PT Frequency 1x / week    PT Duration 4 weeks   based on progress, will reassess for additional POC   PT Treatment/Interventions ADLs/Self Care Home Management;DME Instruction;Neuromuscular re-education;Balance training;Therapeutic exercise;Therapeutic activities;Functional mobility training;Gait training;Patient/family education;Wheelchair mobility training    PT Next Visit Plan any update from the cardiologist regarding BP? will need to assess goals and re-cert ? or HHPT would be more beneficial for pt? hard to progress as medical issues are limiting pt's ability to participate in PT.    Consulted and Agree with Plan of Care Patient;Family member/caregiver    Family Member Consulted Husband           Patient will benefit from skilled therapeutic intervention in order to improve the following deficits and impairments:  Abnormal gait,Difficulty walking,Decreased safety awareness,Decreased activity tolerance,Decreased balance,Decreased mobility,Decreased strength,Postural dysfunction  Visit Diagnosis: Other abnormalities of gait and mobility  Unsteadiness on feet  Muscle weakness (generalized)  Other symptoms and signs  involving the nervous system     Problem List Patient Active Problem  List   Diagnosis Date Noted  . Tremor of both hands 07/30/2020  . Left arm swelling 04/10/2020  . Aortic atherosclerosis ([place]) 03/30/2020  . Cellulitis of arm, left 03/30/2020  . Chronic thoracic back pain 02/03/2020  . Iron deficiency anemia 02/02/2020  . Near syncope 09/27/2019  . Hypertension 09/20/2019  . Acute pain of right knee 08/03/2019  . (HFpEF) heart failure with preserved ejection fraction ([place]) 08/02/2019  . Pacemaker 07/19/2019  . CKD (chronic kidney disease) 01/30/2019  . Lumbar spinal stenosis 01/10/2018  . Osteoarthritis of both knees 12/15/2017  . Epigastric pain 09/09/2017  . Neuralgia 09/09/2017  . Claudication of calf muscles ([place]) 11/22/2016  . DOE (dyspnea on exertion) 11/22/2016  . Hyperreflexia of lower extremity 07/16/2016  . Bilateral leg paresthesia 07/16/2016  . Carotid arterial disease ([place]) 07/13/2016  . Poor balance 06/23/2016  . Weakness of both lower extremities 06/23/2016  . Sinus node dysfunction ([place]) 05/05/2016  . Right shoulder pain 03/14/2016  . Multinodular goiter (nontoxic) 02/21/2016  . Neck pain 02/15/2016  . Prediabetes 12/17/2015  . Dysphagia 12/17/2015  . T8 vertebral fracture ([place]) 04/09/2015  . Overweight 10/09/2011  . Allergic rhinitis, cause unspecified   . GERD (gastroesophageal reflux disease) 02/05/2011  . Vitamin B 12 deficiency 04/17/2009  . Hyperlipidemia 04/17/2009  . Osteoporosis 04/17/2009  . ADENOCARCINOMA, BREAST, HX OF 04/17/2009  . CONSTIPATION 03/08/2009  . COLONIC POLYPS, HYPERPLASTIC, HX OF 03/06/2009    Arliss Journey, PT, DPT  10/08/2020, 4:45 PM  [facility] [address] [place], [place], [address] Phone: [phone]   Fax:  [phone]  Name: [name] MRN: [id number] Date of Birth: [birth year]

## 2020-10-08 NOTE — Telephone Encounter (Signed)
Chanetta Marshall, NP,   I saw your patient [name] for PT today at [facility]. She states that she made the changes to her medications (discontinuing Lasix and decr Metroprolol dose) on Saturday.   Pt's BP today in sitting was 131/47. In standing it was 76/35. Assessed it again in standing after some gentle seated activity with pt's BP at 85/36. Pt reporting a "heavy" feeling in her head when standing.   I wanted to make you aware.   Thanks, Janann August, PT, DPT 10/08/20 4:50 PM

## 2020-10-08 NOTE — Therapy (Signed)
[facility] [address] [place], [place], [address] Phone: [phone]   Fax:  [phone]  Speech Language Pathology Treatment  Patient Details  Name: [name] MRN: [id number] Date of Birth: [birth year] Referring Provider (SLP): [name], DO   Encounter Date: 10/08/2020   End of Session - 10/08/20 1642    Visit Number 2    Number of Visits 17    Date for SLP Re-Evaluation 12/31/20    SLP Start Time 0947    SLP Stop Time  0962    SLP Time Calculation (min) 45 min    Activity Tolerance Patient tolerated treatment well           Past Medical History:  Diagnosis Date  . Allergy   . Anemia   . Cancer of left breast ([facility]) 1978  . CHF (congestive heart failure) ([facility])   . Chronic thoracic back pain    "T8; fracture; 03/2015; no OR" (05/05/2016)  . GERD (gastroesophageal reflux disease)   . Heart murmur   . History of hiatal hernia   . Hyperlipidemia   . Hyperplastic colon polyp   . Hypertension   . IBS (irritable bowel syndrome)   . Multiple thyroid nodules   . Osteoporosis    T8 compression fx 03/2015   . Personal history of radiation therapy   . Presence of permanent cardiac pacemaker   . Vitamin B12 deficiency     Past Surgical History:  Procedure Laterality Date  . ANTERIOR CERVICAL DECOMP/DISCECTOMY FUSION  2008   C5-6  . AUGMENTATION MAMMAPLASTY    . BACK SURGERY    . BREAST SURGERY    . CARDIAC CATHETERIZATION  2001  . EP IMPLANTABLE DEVICE N/A 05/05/2016   Procedure: Pacemaker Implant;  Surgeon: Evans Lance, MD;  Location: [facility];  Service: Cardiovascular;  Laterality: N/A;  . EXCISIONAL HEMORRHOIDECTOMY  1984   With subsequent correction of surgery  . INSERT / REPLACE / REMOVE PACEMAKER    . INTRAVASCULAR PRESSURE WIRE/FFR STUDY N/A 10/07/2019   Procedure: INTRAVASCULAR PRESSURE WIRE/FFR STUDY;  Surgeon: Jettie Booze, MD;  Location: [facility];   Service: Cardiovascular;  Laterality: N/A;  . KNEE ARTHROSCOPY Left    "meniscus tear"  . LEFT HEART CATH AND CORONARY ANGIOGRAPHY N/A 10/07/2019   Procedure: LEFT HEART CATH AND CORONARY ANGIOGRAPHY;  Surgeon: Jettie Booze, MD;  Location: [facility];  Service: Cardiovascular;  Laterality: N/A;  . MASTECTOMY Left 1978  . PLACEMENT OF BREAST IMPLANTS Left 1981  . REDUCTION MAMMAPLASTY Right   . SHOULDER ARTHROSCOPY W/ ROTATOR CUFF REPAIR Left 02/2006   "tear"  . TUBAL LIGATION      There were no vitals filed for this visit.   Subjective Assessment - 10/08/20 1234    Subjective "shakey"    Patient is accompained by: Family member    Currently in Pain? No/denies                 ADULT SLP TREATMENT - 10/08/20 1238      General Information   Behavior/Cognition Alert;Cooperative;Pleasant mood      Treatment Provided   Treatment provided Cognitive-Linquistic      Cognitive-Linquistic Treatment   Treatment focused on Dysarthria;Patient/family/caregiver education    Skilled Treatment Pt reported "I am having hoarseness and strangling." Swallowing issues reported over last 20 years secondary to "titanium plate" at base of neck. SLP targeted abdominal breathing (AB) to improve breath support  and decrease hoareness, with mod A required to reduce tension and decrease occurance of clavicular breathing. Usual mod A required to coordinate timing of speaking at peak of exhalation, which resulted in decreased volume and breathy vocal quality. Occasional throat clearing noted, in which SLP educated pt and husband on throat clears alternatives. Minimal carryover of strategies noted at this time.      Assessment / Recommendations / Plan   Plan Continue with current plan of care      Progression Toward Goals   Progression toward goals Progressing toward goals            SLP Education - 10/08/20 1641    Education Details abdominal breathing, throat clear alternatives     Person(s) Educated Patient;Spouse    Methods Explanation;Demonstration;Verbal cues    Comprehension Verbalized understanding;Returned demonstration;Verbal cues required;Need further instruction            SLP Short Term Goals - 10/08/20 1238      SLP [place] #1   Title pt will produce loud /a/ or "hey!" with at least mid-upper 80s dB average over three sessions    Time 4    Period Weeks   or 9 sessions for all STGs   Status On-going      SLP SHORT TERM GOAL #2   Title pt will generate abdominal breathing 80% of the time at rest for 5 minutes in 2 sessions    Time 4    Period Weeks    Status On-going      SLP SHORT TERM GOAL #3   Title pt will generate abdominal breathing 80% of the time when answering questions from SLP over 2 sessions    Time 4    Period Weeks    Status On-going            SLP Long Term Goals - 10/08/20 1238      SLP LONG TERM GOAL #1   Title pt will generate abdominal breathing 80% of the time in 8 minutes simple-mod complex conversation with SLP over 2 sessions    Time 8    Period Weeks   or 17 total sessions, for all LTGs   Status On-going      SLP LONG TERM GOAL #2   Title Pt will produce 8 minutes simple-mod complex conversation with WNL volume over three sessions    Time 8    Period Weeks    Status On-going      SLP LONG TERM GOAL #3   Title Pt will produce 8 minutes mod complex conversation with WNL volume over three sessions    Time 8    Period Weeks    Status On-going      SLP LONG TERM GOAL #4   Title pt will undergo objective swallowing assessment if deemed clinically necessary-signature of DO on this plan of care will be understood as agreement with this judgment by SLP    Time 8    Period Weeks    Status On-going            Plan - 10/08/20 1642    Clinical Impression Statement Pt presents today with 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 Pt reports difficulty swallowing over last ~20 years  following surgical placement of "titanium plate" at base of neck. Pt may require objective swallow eval during this therapy course (MBSS, FEES). Further instruction of abdominal breathing completed, with mod A required to reduce tension. SLP used tactile cue  of hand on abdominal musculature for "strong speech" for visual feedback for pt. Difficulty timing/coordinating speech following deep inhalation exhibited. SLP believes pt will benefit from skilled ST targeting incr'd volume and greater usage of abdomoinal breathing in order to improve communicative effectiveness, and if necessary swallow therapy to improve swallow strength and reduce risk for aspiration.    Speech Therapy Frequency 2x / week    Duration 8 weeks   or 17 total visits   Treatment/Interventions Aspiration precaution training;Pharyngeal strengthening exercises;Diet toleration management by SLP;Trials of upgraded texture/liquids;Internal/external aids;Patient/family education;Compensatory strategies;SLP instruction and feedback;Functional tasks    Potential to Achieve Goals Good    Consulted and Agree with Plan of Care Patient;Family member/caregiver    Family Member Consulted David           Patient will benefit from skilled therapeutic intervention in order to improve the following deficits and impairments:   Dysarthria and anarthria    Problem List Patient Active Problem List   Diagnosis Date Noted  . Tremor of both hands 07/30/2020  . Left arm swelling 04/10/2020  . Aortic atherosclerosis ([facility]) 03/30/2020  . Cellulitis of arm, left 03/30/2020  . Chronic thoracic back pain 02/03/2020  . Iron deficiency anemia 02/02/2020  . Near syncope 09/27/2019  . Hypertension 09/20/2019  . Acute pain of right knee 08/03/2019  . (HFpEF) heart failure with preserved ejection fraction ([facility]) 08/02/2019  . Pacemaker 07/19/2019  . CKD (chronic kidney disease) 01/30/2019  . Lumbar spinal stenosis 01/10/2018  . Osteoarthritis of both  knees 12/15/2017  . Epigastric pain 09/09/2017  . Neuralgia 09/09/2017  . Claudication of calf muscles ([facility]) 11/22/2016  . DOE (dyspnea on exertion) 11/22/2016  . Hyperreflexia of lower extremity 07/16/2016  . Bilateral leg paresthesia 07/16/2016  . Carotid arterial disease ([facility]) 07/13/2016  . Poor balance 06/23/2016  . Weakness of both lower extremities 06/23/2016  . Sinus node dysfunction ([facility]) 05/05/2016  . Right shoulder pain 03/14/2016  . Multinodular goiter (nontoxic) 02/21/2016  . Neck pain 02/15/2016  . Prediabetes 12/17/2015  . Dysphagia 12/17/2015  . T8 vertebral fracture ([facility]) 04/09/2015  . Overweight 10/09/2011  . Allergic rhinitis, cause unspecified   . GERD (gastroesophageal reflux disease) 02/05/2011  . Vitamin B 12 deficiency 04/17/2009  . Hyperlipidemia 04/17/2009  . Osteoporosis 04/17/2009  . ADENOCARCINOMA, BREAST, HX OF 04/17/2009  . CONSTIPATION 03/08/2009  . COLONIC POLYPS, HYPERPLASTIC, HX OF 03/06/2009    Alinda Deem, MA CCC-SLP 10/08/2020, 4:47 PM  [facility] [address] [place], [place], [address] Phone: [phone]   Fax:  [phone]   Name: [name] MRN: [id number] Date of Birth: Jan 23, 1935

## 2020-10-08 NOTE — Patient Instructions (Signed)
  ABDOMINAL BREATHING   . Shoulders down - this is a cue to relax . Place your hand on your abdomen - this helps you focus on easy abdominal breath support - the best and most relaxed way to breathe . Breathe in through your nose and fill your belly with air, watching your hand move outward . Breathe out through your mouth and watch your belly move in. An audible "sh"  may help   Think of your belly as a balloon, when you fill with air (inhale), the balloon gets bigger. As the air goes out (exhale), the balloon deflates.  If you are having difficulty coordinating this, lay on your back with a plastic cup on your belly and repeat the above steps, watching you belly move up with inhalation and down with exhalations  Practice breathing in and out in front of a mirror, watching your belly Breathe in for a count of 5 and breathe out for a count of 5  Now as you breathe out, get a picture of relaxing in your mind Feel the constant in-out of your breathing with your belly Picture the tension in your throat and chest evaporate like steam, melting away and FEEL it do so Picture your throat opening up so wide that a grapefruit or softball could fit through your throat.   Practice this throughout the day. For example: in the car, when watching TV, before medications. Regular practice when you are feeling well is important.

## 2020-10-10 ENCOUNTER — Telehealth: Payer: Medicare Other

## 2020-10-11 DIAGNOSIS — Z48817 Encounter for surgical aftercare following surgery on the skin and subcutaneous tissue: Secondary | ICD-10-CM | POA: Diagnosis not present

## 2020-10-11 DIAGNOSIS — Z85828 Personal history of other malignant neoplasm of skin: Secondary | ICD-10-CM | POA: Diagnosis not present

## 2020-10-12 ENCOUNTER — Other Ambulatory Visit: Payer: Self-pay

## 2020-10-12 ENCOUNTER — Ambulatory Visit: Payer: Medicare Other

## 2020-10-12 DIAGNOSIS — R471 Dysarthria and anarthria: Secondary | ICD-10-CM | POA: Diagnosis not present

## 2020-10-12 DIAGNOSIS — R29818 Other symptoms and signs involving the nervous system: Secondary | ICD-10-CM | POA: Diagnosis not present

## 2020-10-12 DIAGNOSIS — R2681 Unsteadiness on feet: Secondary | ICD-10-CM | POA: Diagnosis not present

## 2020-10-12 DIAGNOSIS — R2689 Other abnormalities of gait and mobility: Secondary | ICD-10-CM | POA: Diagnosis not present

## 2020-10-12 DIAGNOSIS — R1310 Dysphagia, unspecified: Secondary | ICD-10-CM | POA: Diagnosis not present

## 2020-10-12 DIAGNOSIS — M6281 Muscle weakness (generalized): Secondary | ICD-10-CM | POA: Diagnosis not present

## 2020-10-12 NOTE — Therapy (Signed)
[facility] [address] [place], [place], [address] Phone: [phone]   Fax:  [phone]  Speech Language Pathology Treatment  Patient Details  Name: [name] MRN: [id number] Date of Birth: 18-Sep-1934 Referring Provider (SLP): [name], DO   Encounter Date: 10/12/2020   End of Session - 10/12/20 1508    Visit Number 3    Number of Visits 17    Date for SLP Re-Evaluation 12/31/20    SLP Start Time 1017    SLP Stop Time  1400    SLP Time Calculation (min) 45 min    Activity Tolerance Patient tolerated treatment well           Past Medical History:  Diagnosis Date  . Allergy   . Anemia   . Cancer of left breast ([facility]) 1978  . CHF (congestive heart failure) ([facility])   . Chronic thoracic back pain    "T8; fracture; 03/2015; no OR" (05/05/2016)  . GERD (gastroesophageal reflux disease)   . Heart murmur   . History of hiatal hernia   . Hyperlipidemia   . Hyperplastic colon polyp   . Hypertension   . IBS (irritable bowel syndrome)   . Multiple thyroid nodules   . Osteoporosis    T8 compression fx 03/2015   . Personal history of radiation therapy   . Presence of permanent cardiac pacemaker   . Vitamin B12 deficiency     Past Surgical History:  Procedure Laterality Date  . ANTERIOR CERVICAL DECOMP/DISCECTOMY FUSION  2008   C5-6  . AUGMENTATION MAMMAPLASTY    . BACK SURGERY    . BREAST SURGERY    . CARDIAC CATHETERIZATION  2001  . EP IMPLANTABLE DEVICE N/A 05/05/2016   Procedure: Pacemaker Implant;  Surgeon: Evans Lance, MD;  Location: [facility];  Service: Cardiovascular;  Laterality: N/A;  . EXCISIONAL HEMORRHOIDECTOMY  1984   With subsequent correction of surgery  . INSERT / REPLACE / REMOVE PACEMAKER    . INTRAVASCULAR PRESSURE WIRE/FFR STUDY N/A 10/07/2019   Procedure: INTRAVASCULAR PRESSURE WIRE/FFR STUDY;  Surgeon: Jettie Booze, MD;  Location: [facility];   Service: Cardiovascular;  Laterality: N/A;  . KNEE ARTHROSCOPY Left    "meniscus tear"  . LEFT HEART CATH AND CORONARY ANGIOGRAPHY N/A 10/07/2019   Procedure: LEFT HEART CATH AND CORONARY ANGIOGRAPHY;  Surgeon: Jettie Booze, MD;  Location: [facility];  Service: Cardiovascular;  Laterality: N/A;  . MASTECTOMY Left 1978  . PLACEMENT OF BREAST IMPLANTS Left 1981  . REDUCTION MAMMAPLASTY Right   . SHOULDER ARTHROSCOPY W/ ROTATOR CUFF REPAIR Left 02/2006   "tear"  . TUBAL LIGATION      There were no vitals filed for this visit.   Subjective Assessment - 10/12/20 1314    Subjective "I feel wiped out"    Patient is accompained by: Family member   Shanon Brow   Currently in Pain? No/denies                 ADULT SLP TREATMENT - 10/12/20 1312      General Information   Behavior/Cognition Alert;Cooperative;Pleasant mood      Treatment Provided   Treatment provided Cognitive-Linquistic      Cognitive-Linquistic Treatment   Treatment focused on Dysarthria;Patient/family/caregiver education    Skilled Treatment Pt endorsed feeling tired today. Pt stated her daugher and friend both reported they could tell a difference in her voice. SLP reviewed abdominal breathing, which pt  reported she is able to do more accurately while laying down on bed. SLP provided mod A fading to min A to accurately demo abdominal breathing for /a/ and sentences, as pt noted to hold breath or speak mid-exhalation. Loud /a/ ranged from mid 70s up to low 80s db with usual cues. SLP targeted functional sentences to practice, in which pt able to improve clarity and loudness from mid 60s db to mid 70s db with cued use of AB and relaxed/flowing speech. In conversation, pt noted to speak quickly with minimal pauses for breathing, resulting in chest breathing and decreased volume. SLP to further train AB for conversation, with recommendations for breathing breaks by asking questions during convo. SLP provided  additional resource for throat clear alternatives, with improving awareness of TC noted this session.      Assessment / Recommendations / Plan   Plan Continue with current plan of care      Progression Toward Goals   Progression toward goals Progressing toward goals            SLP Education - 10/12/20 1508    Education Details abdominal breathing, throat clear alternatives, functional phrases    Person(s) Educated Patient;Spouse    Methods Explanation;Demonstration;Handout    Comprehension Verbalized understanding;Returned demonstration;Need further instruction            SLP Short Term Goals - 10/12/20 1305      SLP SHORT TERM GOAL #1   Title pt will produce loud /a/ or "hey!" with at least mid-upper 80s dB average over three sessions    Time 4    Period Weeks   or 9 sessions for all STGs   Status On-going      SLP SHORT TERM GOAL #2   Title pt will generate abdominal breathing 80% of the time at rest for 5 minutes in 2 sessions    Time 4    Period Weeks    Status On-going      SLP SHORT TERM GOAL #3   Title pt will generate abdominal breathing 80% of the time when answering questions from SLP over 2 sessions    Time 4    Period Weeks    Status On-going            SLP Long Term Goals - 10/12/20 1306      SLP LONG TERM GOAL #1   Title pt will generate abdominal breathing 80% of the time in 8 minutes simple-mod complex conversation with SLP over 2 sessions    Time 8    Period Weeks   or 17 total sessions, for all LTGs   Status On-going      SLP LONG TERM GOAL #2   Title Pt will produce 8 minutes simple-mod complex conversation with WNL volume over three sessions    Time 8    Period Weeks    Status On-going      SLP LONG TERM GOAL #3   Title Pt will produce 8 minutes mod complex conversation with WNL volume over three sessions    Time 8    Period Weeks    Status On-going      SLP LONG TERM GOAL #4   Title pt will undergo objective swallowing assessment  if deemed clinically necessary-signature of DO on this plan of care will be understood as agreement with this judgment by SLP    Time 8    Period Weeks    Status On-going  Plan - 10/12/20 1508    Clinical Impression Statement Pt presents today with 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 Further instruction of abdominal breathing completed, with mod A fading to min A to produce abdominal breathing for words and short sentences. Less difficulty timing and coordinating speech following deep inhalation exhibited this session, with improving awareness of breath hold and mid-exhalation speaking noted. See "skilled treatment" for additional details of today's session. SLP believes pt will benefit from skilled ST targeting incr'd volume and greater usage of abdomoinal breathing in order to improve communicative effectiveness, and if necessary swallow therapy to improve swallow strength and reduce risk for aspiration.    Speech Therapy Frequency 2x / week    Duration 8 weeks   or 17 total sessions   Treatment/Interventions Aspiration precaution training;Pharyngeal strengthening exercises;Diet toleration management by SLP;Trials of upgraded texture/liquids;Internal/external aids;Patient/family education;Compensatory strategies;SLP instruction and feedback;Functional tasks    Potential to Achieve Goals Good    SLP Home Exercise Plan provided    Consulted and Agree with Plan of Care Patient;Family member/caregiver    Family Member Consulted David           Patient will benefit from skilled therapeutic intervention in order to improve the following deficits and impairments:   Dysarthria and anarthria    Problem List Patient Active Problem List   Diagnosis Date Noted  . Tremor of both hands 07/30/2020  . Left arm swelling 04/10/2020  . Aortic atherosclerosis ([facility]) 03/30/2020  . Cellulitis of arm, left 03/30/2020  . Chronic thoracic back pain 02/03/2020  . Iron  deficiency anemia 02/02/2020  . Near syncope 09/27/2019  . Hypertension 09/20/2019  . Acute pain of right knee 08/03/2019  . (HFpEF) heart failure with preserved ejection fraction ([facility]) 08/02/2019  . Pacemaker 07/19/2019  . CKD (chronic kidney disease) 01/30/2019  . Lumbar spinal stenosis 01/10/2018  . Osteoarthritis of both knees 12/15/2017  . Epigastric pain 09/09/2017  . Neuralgia 09/09/2017  . Claudication of calf muscles ([facility]) 11/22/2016  . DOE (dyspnea on exertion) 11/22/2016  . Hyperreflexia of lower extremity 07/16/2016  . Bilateral leg paresthesia 07/16/2016  . Carotid arterial disease ([facility]) 07/13/2016  . Poor balance 06/23/2016  . Weakness of both lower extremities 06/23/2016  . Sinus node dysfunction ([facility]) 05/05/2016  . Right shoulder pain 03/14/2016  . Multinodular goiter (nontoxic) 02/21/2016  . Neck pain 02/15/2016  . Prediabetes 12/17/2015  . Dysphagia 12/17/2015  . T8 vertebral fracture ([facility]) 04/09/2015  . Overweight 10/09/2011  . Allergic rhinitis, cause unspecified   . GERD (gastroesophageal reflux disease) 02/05/2011  . Vitamin B 12 deficiency 04/17/2009  . Hyperlipidemia 04/17/2009  . Osteoporosis 04/17/2009  . ADENOCARCINOMA, BREAST, HX OF 04/17/2009  . CONSTIPATION 03/08/2009  . COLONIC POLYPS, HYPERPLASTIC, HX OF 03/06/2009    Alinda Deem, MA CCC-SLP 10/12/2020, 3:17 PM  [facility] [address] [place], [place], [address] Phone: [phone]   Fax:  [phone]   Name: ANNORA GUDERIAN MRN: [id number] Date of Birth: 1934/09/19

## 2020-10-12 NOTE — Patient Instructions (Addendum)
   Practice these twice a day: -Practice belly breathing in bed in morning and night. Practice a few times throughout.  -Say "ahhhh" OR "hey!" x5. Breath in and speak out -Practice your sentences (below). Breath in and speak out     What to Do Instead of Clearing Your Throat Practice using all 7 ways. Select the 1 or 2 that feel most comfortable. Your goal is to use these instead of clearing your throat. 1. Swallow your saliva with a "squeezing" hard swallow. 2. Take a sip of water. 3. Suck on ice chips. 4. Use a silent cough. Whisper the word "huh" from your belly without making a sound and then swallow. This is like a cough but without using your voice. 5. Hum on an "M" and then swallow. 6. Use a light, gentle cough (like tapping your vocal folds together) and then swallow. 7. Silently count to 10 and then swallow

## 2020-10-15 ENCOUNTER — Telehealth: Payer: Self-pay | Admitting: Internal Medicine

## 2020-10-15 ENCOUNTER — Other Ambulatory Visit: Payer: Self-pay

## 2020-10-15 MED ORDER — METOPROLOL SUCCINATE ER 25 MG PO TB24: 25.0000 mg | ORAL_TABLET | Freq: Every day | ORAL | 3 refills | Status: DC

## 2020-10-15 NOTE — Telephone Encounter (Signed)
*  STAT* If patient is at the pharmacy, call can be transferred to refill team.   1. Which medications need to be refilled? (please list name of each medication and dose if known)  metoprolol succinate (TOPROL XL) 25 MG 24 hr tablet  2. Which pharmacy/location (including street and city if local pharmacy) is medication to be sent to? [facility] - [place], [place] - [place] [address]  3. Do they need a 30 day or 90 day supply? 90 day supply

## 2020-10-16 ENCOUNTER — Ambulatory Visit: Payer: Medicare Other | Admitting: Physical Therapy

## 2020-10-18 ENCOUNTER — Encounter: Payer: Self-pay | Admitting: Physical Therapy

## 2020-10-18 ENCOUNTER — Other Ambulatory Visit: Payer: Self-pay

## 2020-10-18 ENCOUNTER — Ambulatory Visit: Payer: Medicare Other

## 2020-10-18 ENCOUNTER — Ambulatory Visit: Payer: Medicare Other | Admitting: Physical Therapy

## 2020-10-18 VITALS — BP 117/41 | HR 62

## 2020-10-18 DIAGNOSIS — R29818 Other symptoms and signs involving the nervous system: Secondary | ICD-10-CM

## 2020-10-18 DIAGNOSIS — M6281 Muscle weakness (generalized): Secondary | ICD-10-CM

## 2020-10-18 DIAGNOSIS — R1310 Dysphagia, unspecified: Secondary | ICD-10-CM | POA: Diagnosis not present

## 2020-10-18 DIAGNOSIS — R471 Dysarthria and anarthria: Secondary | ICD-10-CM

## 2020-10-18 DIAGNOSIS — R2689 Other abnormalities of gait and mobility: Secondary | ICD-10-CM | POA: Diagnosis not present

## 2020-10-18 DIAGNOSIS — R2681 Unsteadiness on feet: Secondary | ICD-10-CM

## 2020-10-18 NOTE — Patient Instructions (Signed)

## 2020-10-18 NOTE — Therapy (Signed)
[facility] [address] [place], [place], [address] Phone: [phone]   Fax:  [phone]  Speech Language Pathology Treatment  Patient Details  Name: [name] MRN: [id number] Date of Birth: [birth year] Referring Provider (SLP): [name], DO   Encounter Date: 10/18/2020   End of Session - 10/18/20 1205    Visit Number 4    Number of Visits 17    Date for SLP Re-Evaluation 12/31/20    SLP Start Time 1100    SLP Stop Time  1145    SLP Time Calculation (min) 45 min    Activity Tolerance Patient tolerated treatment well           Past Medical History:  Diagnosis Date  . Allergy   . Anemia   . Cancer of left breast ([facility]) 1978  . CHF (congestive heart failure) ([facility])   . Chronic thoracic back pain    "T8; fracture; 03/2015; no OR" (05/05/2016)  . GERD (gastroesophageal reflux disease)   . Heart murmur   . History of hiatal hernia   . Hyperlipidemia   . Hyperplastic colon polyp   . Hypertension   . IBS (irritable bowel syndrome)   . Multiple thyroid nodules   . Osteoporosis    T8 compression fx 03/2015   . Personal history of radiation therapy   . Presence of permanent cardiac pacemaker   . Vitamin B12 deficiency     Past Surgical History:  Procedure Laterality Date  . ANTERIOR CERVICAL DECOMP/DISCECTOMY FUSION  2008   C5-6  . AUGMENTATION MAMMAPLASTY    . BACK SURGERY    . BREAST SURGERY    . CARDIAC CATHETERIZATION  2001  . EP IMPLANTABLE DEVICE N/A 05/05/2016   Procedure: Pacemaker Implant;  Surgeon: Evans Lance, MD;  Location: [facility];  Service: Cardiovascular;  Laterality: N/A;  . EXCISIONAL HEMORRHOIDECTOMY  1984   With subsequent correction of surgery  . INSERT / REPLACE / REMOVE PACEMAKER    . INTRAVASCULAR PRESSURE WIRE/FFR STUDY N/A 10/07/2019   Procedure: INTRAVASCULAR PRESSURE WIRE/FFR STUDY;  Surgeon: Jettie Booze, MD;  Location: [facility];   Service: Cardiovascular;  Laterality: N/A;  . KNEE ARTHROSCOPY Left    "meniscus tear"  . LEFT HEART CATH AND CORONARY ANGIOGRAPHY N/A 10/07/2019   Procedure: LEFT HEART CATH AND CORONARY ANGIOGRAPHY;  Surgeon: Jettie Booze, MD;  Location: [facility];  Service: Cardiovascular;  Laterality: N/A;  . MASTECTOMY Left 1978  . PLACEMENT OF BREAST IMPLANTS Left 1981  . REDUCTION MAMMAPLASTY Right   . SHOULDER ARTHROSCOPY W/ ROTATOR CUFF REPAIR Left 02/2006   "tear"  . TUBAL LIGATION      There were no vitals filed for this visit.   Subjective Assessment - 10/18/20 1201    Subjective "I feel good"    Patient is accompained by: Family member    Currently in Pain? No/denies                 ADULT SLP TREATMENT - 10/18/20 1101      General Information   Behavior/Cognition Alert;Cooperative;Pleasant mood      Treatment Provided   Treatment provided Cognitive-Linquistic      Cognitive-Linquistic Treatment   Treatment focused on Dysarthria;Patient/family/caregiver education    Skilled Treatment SLP reviewed abdominal breathing and throat clear alteratives, as pt expressed decreased understanding of throat clear alternatives and reduced ability to complete AB independently. Mod A fading to min A  required to relax and reduce clavicular breathing, with improved carryover throughout remainder of session. Mod A required to speak at top of exhalation for sentence levels. Pt able to maintain conversational loudness for ~10 minutes for mod complex conversation, with occasional min A required for intermittent abdominal breathing. Pt benefits from simplified instruction to facilitate relaxed speaking and improve carryover of compensations. Overall, her husband reports improved volume intensity and loudness at home.      Assessment / Recommendations / Plan   Plan Continue with current plan of care      Progression Toward Goals   Progression toward goals Progressing toward goals             SLP Education - 10/18/20 1203    Education Details relaxation while practicing abdominal breathing, conversational phrases, simplified version of throat clear alternatives    Person(s) Educated Patient;Spouse    Methods Explanation;Demonstration;Handout    Comprehension Verbalized understanding;Returned demonstration;Need further instruction            SLP Short Term Goals - 10/18/20 1102      SLP SHORT TERM GOAL #1   Title pt will produce loud /a/ or "hey!" with at least mid-upper 80s dB average over three sessions    Time 3    Period Weeks   or 9 sessions for all STGs   Status On-going      SLP SHORT TERM GOAL #2   Title pt will generate abdominal breathing 80% of the time at rest for 5 minutes in 2 sessions    Time 3    Period Weeks    Status On-going      SLP SHORT TERM GOAL #3   Title pt will generate abdominal breathing 80% of the time when answering questions from SLP over 2 sessions    Time 3    Period Weeks    Status On-going            SLP Long Term Goals - 10/18/20 1102      SLP LONG TERM GOAL #1   Title pt will generate abdominal breathing 80% of the time in 8 minutes simple-mod complex conversation with SLP over 2 sessions    Time 7    Period Weeks   or 17 total sessions, for all LTGs   Status On-going      SLP LONG TERM GOAL #2   Title Pt will produce 8 minutes simple-mod complex conversation with WNL volume over three sessions    Time 7    Period Weeks    Status On-going      SLP LONG TERM GOAL #3   Title Pt will produce 8 minutes mod complex conversation with WNL volume over three sessions    Time 7    Period Weeks    Status On-going      SLP LONG TERM GOAL #4   Title pt will undergo objective swallowing assessment if deemed clinically necessary-signature of DO on this plan of care will be understood as agreement with this judgment by SLP    Time 7    Period Weeks    Status On-going            Plan - 10/18/20 1205     Clinical Impression Statement Pt presents today with 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 Further instruction of abdominal breathing completed, with mod A fading to min A to produce abdominal breathing for sentence and conversational levels. Increased difficulty timing and coordinating speech following deep  inhalation exhibited this session, which improved with visual aid. Questionable motiviation to improve speech demo'd this session, in which simplified education and encouragement were effective. See "skilled treatment" for additional details of today's session. SLP believes pt will benefit from skilled ST targeting incr'd volume and greater usage of abdomoinal breathing in order to improve communicative effectiveness, and if necessary swallow therapy to improve swallow strength and reduce risk for aspiration.    Speech Therapy Frequency 2x / week    Duration 8 weeks   or 17 visits   Treatment/Interventions Aspiration precaution training;Pharyngeal strengthening exercises;Diet toleration management by SLP;Trials of upgraded texture/liquids;Internal/external aids;Patient/family education;Compensatory strategies;SLP instruction and feedback;Functional tasks    Potential to Achieve Goals Good    Potential Considerations Previous level of function;Cooperation/participation level    SLP Home Exercise Plan provided    Consulted and Agree with Plan of Care Patient;Family member/caregiver           Patient will benefit from skilled therapeutic intervention in order to improve the following deficits and impairments:   Dysarthria and anarthria    Problem List Patient Active Problem List   Diagnosis Date Noted  . Tremor of both hands 07/30/2020  . Left arm swelling 04/10/2020  . Aortic atherosclerosis ([facility]) 03/30/2020  . Cellulitis of arm, left 03/30/2020  . Chronic thoracic back pain 02/03/2020  . Iron deficiency anemia 02/02/2020  . Near syncope 09/27/2019  . Hypertension  09/20/2019  . Acute pain of right knee 08/03/2019  . (HFpEF) heart failure with preserved ejection fraction ([facility]) 08/02/2019  . Pacemaker 07/19/2019  . CKD (chronic kidney disease) 01/30/2019  . Lumbar spinal stenosis 01/10/2018  . Osteoarthritis of both knees 12/15/2017  . Epigastric pain 09/09/2017  . Neuralgia 09/09/2017  . Claudication of calf muscles ([facility]) 11/22/2016  . DOE (dyspnea on exertion) 11/22/2016  . Hyperreflexia of lower extremity 07/16/2016  . Bilateral leg paresthesia 07/16/2016  . Carotid arterial disease ([facility]) 07/13/2016  . Poor balance 06/23/2016  . Weakness of both lower extremities 06/23/2016  . Sinus node dysfunction ([facility]) 05/05/2016  . Right shoulder pain 03/14/2016  . Multinodular goiter (nontoxic) 02/21/2016  . Neck pain 02/15/2016  . Prediabetes 12/17/2015  . Dysphagia 12/17/2015  . T8 vertebral fracture ([facility]) 04/09/2015  . Overweight 10/09/2011  . Allergic rhinitis, cause unspecified   . GERD (gastroesophageal reflux disease) 02/05/2011  . Vitamin B 12 deficiency 04/17/2009  . Hyperlipidemia 04/17/2009  . Osteoporosis 04/17/2009  . ADENOCARCINOMA, BREAST, HX OF 04/17/2009  . CONSTIPATION 03/08/2009  . COLONIC POLYPS, HYPERPLASTIC, HX OF 03/06/2009    Alinda Deem, MA CCC-SLP 10/18/2020, 12:08 PM  [facility] [address] [place], [place], [address] Phone: [phone]   Fax:  [phone]   Name: [name] MRN: [id number] Date of Birth: Jun 26, 1935

## 2020-10-18 NOTE — Therapy (Signed)
[facility] [address] [place], [place], [address] Phone: [phone]   Fax:  [phone]  Physical Therapy Treatment/Recert   Patient Details  Name: [name] MRN: [id number] Date of Birth: January 10, 1935 Referring Provider (PT): Tat, Wells Guiles   Encounter Date: 10/18/2020   PT End of Session - 10/18/20 1019    Visit Number 5    Number of Visits 13    Date for PT Re-Evaluation 45/36/46   recert for 90 days; 8 wk POC (recert completed 02/28/20)   Authorization Type Medicare/AARP    PT Start Time 1019    PT Stop Time 1100    PT Time Calculation (min) 41 min    Equipment Utilized During Treatment Gait belt    Activity Tolerance Patient tolerated treatment well    Behavior During Therapy Flat affect;WFL for tasks assessed/performed           Past Medical History:  Diagnosis Date  . Allergy   . Anemia   . Cancer of left breast ([facility]) 1978  . CHF (congestive heart failure) ([facility])   . Chronic thoracic back pain    "T8; fracture; 03/2015; no OR" (05/05/2016)  . GERD (gastroesophageal reflux disease)   . Heart murmur   . History of hiatal hernia   . Hyperlipidemia   . Hyperplastic colon polyp   . Hypertension   . IBS (irritable bowel syndrome)   . Multiple thyroid nodules   . Osteoporosis    T8 compression fx 03/2015   . Personal history of radiation therapy   . Presence of permanent cardiac pacemaker   . Vitamin B12 deficiency     Past Surgical History:  Procedure Laterality Date  . ANTERIOR CERVICAL DECOMP/DISCECTOMY FUSION  2008   C5-6  . AUGMENTATION MAMMAPLASTY    . BACK SURGERY    . BREAST SURGERY    . CARDIAC CATHETERIZATION  2001  . EP IMPLANTABLE DEVICE N/A 05/05/2016   Procedure: Pacemaker Implant;  Surgeon: Evans Lance, MD;  Location: [facility];  Service: Cardiovascular;  Laterality: N/A;  . EXCISIONAL HEMORRHOIDECTOMY  1984   With subsequent correction of surgery  . INSERT /  REPLACE / REMOVE PACEMAKER    . INTRAVASCULAR PRESSURE WIRE/FFR STUDY N/A 10/07/2019   Procedure: INTRAVASCULAR PRESSURE WIRE/FFR STUDY;  Surgeon: Jettie Booze, MD;  Location: [facility];  Service: Cardiovascular;  Laterality: N/A;  . KNEE ARTHROSCOPY Left    "meniscus tear"  . LEFT HEART CATH AND CORONARY ANGIOGRAPHY N/A 10/07/2019   Procedure: LEFT HEART CATH AND CORONARY ANGIOGRAPHY;  Surgeon: Jettie Booze, MD;  Location: [facility];  Service: Cardiovascular;  Laterality: N/A;  . MASTECTOMY Left 1978  . PLACEMENT OF BREAST IMPLANTS Left 1981  . REDUCTION MAMMAPLASTY Right   . SHOULDER ARTHROSCOPY W/ ROTATOR CUFF REPAIR Left 02/2006   "tear"  . TUBAL LIGATION      Vitals:   10/18/20 1026  BP: (!) 117/41  Pulse: 62     Subjective Assessment - 10/18/20 1018    Subjective Sunday was really bad; husband had to help all day.  No falls since last week.  Blood pressure has gone up some.  Would like to continue with PT.    Patient is accompained by: Family member   Husband   Pertinent History PMH includes CHF, pacemaker placement 2017, cataract surgery, compression fracture, chronic back pain.    Patient Stated Goals Pt's goals for therapy are to -Unsure-this  is just happening so fast.  Pt agrees to working to lessen fall risk and improve safe mobility.    Currently in Pain? No/denies                             [facility] Adult PT Treatment/Exercise - 10/18/20 0001      Transfers   Transfers Sit to Stand;Stand to Sit    Sit to Stand 4: Min guard;With upper extremity assist;From bed    Sit to Stand Details Verbal cues for sequencing;Verbal cues for technique    Five time sit to stand comments  x 3 reps total in session.  3rd rep to reach to handles of UpWalker    Stand to Sit 4: Min assist;With upper extremity assist;Uncontrolled descent;To bed   Cues to control descent with flexed hip/knees   Stand to Sit Details (indicate cue type and  reason) Tactile cues for initiation;Tactile cues for sequencing;Verbal cues for sequencing;Verbal cues for technique    Stand to Sit Details cues/assist for controlled descent into sitting, when pt reports "fullness in head"    Comments pt performing stand step transfer from transport chair > mat table with HHA and min A.      Ambulation/Gait   Ambulation/Gait Yes    Ambulation/Gait Assistance 4: Min guard;4: Min assist    Ambulation/Gait Assistance Details At end of session, used clinicUpWalker to walk from gym towards speech session, aprox 40 ft.  No tremors noted today.  She is able to perform safe stand>sit transfer with min guard assist and PT locking brakes of UpWalker.    Ambulation Distance (Feet) 40 Feet    Assistive device 4-wheeled walker;Other (Comment)   UpWalker   Gait Pattern Step-through pattern;Decreased step length - right;Decreased step length - left;Festinating;Trunk flexed;Narrow base of support    Ambulation Surface Level;Indoor    Gait velocity 17.03 sec in 25 ft = 1.47 ft/sec      Self-Care   Self-Care Other Self-Care Comments    Other Self-Care Comments  Fall prevention educated discussed with pt/wife.  Discussed POC/progress towards goals, limitations with medical issues; pt and husband agreeable to continuing with PT/extending POC.      Therapeutic Activites    Therapeutic Activities Other Therapeutic Activities    Other Therapeutic Activities Stand at edge of mat, 1:30, holding to locked transport chair, PT min guard. Pt performs 2 reps      Exercises   Exercises Other Exercises    Other Exercises  Seated leg exercises at edge of mat:  marching in place 2 sets x 10, LAQ 2 sets x 10 reps, ankle pumps-heel raises 2 sets x 10, toe raises 2 sets x 10. seated hip flexion (keeping spine straight) to upright posture x10 reps with verbal and tactile cues for scapular retraction.  Seated heelslides, x 10 reps, pillow case under foot.  Additional 10 reps ankle pumps, then  seated side step out and in, 10 reps.                  PT Education - 10/18/20 1153    Education Details Fall prevention education, POC and progress towards goals    Person(s) Educated Patient;Spouse    Methods Explanation    Comprehension Verbalized understanding               PT Long Term Goals - 10/18/20 1023      PT LONG TERM GOAL #1   Title Pt/husband  will demo correct transfer technique for sit<>stand from chair to [employer].  TARGET 10/05/2020    Baseline needs min guard and reminder cues on proper technique to stand min A for safety due to uncontrolled descent and pt reporting her head feels heavy in standing.    Time 4    Period Weeks    Status Not Met      PT LONG TERM GOAL #2   Title Pt will stand at least 2 minutes with UE support with no episode of retropulsion, for improved standing to participate in ADLs.    Baseline retropulsion upon standing; 3/24:  2 standing bouts of 1:30 each, no retropulsion    Time 4    Period Weeks    Status Partially Met      PT LONG TERM GOAL #3   Title Pt will improve gait velocity to at least 1.5 ft/sec for improved gait efficiency and safety for household gait.    Baseline 10/18/20:  1.47 ft/sec    Time 4    Period Weeks    Status Partially Met      PT LONG TERM GOAL #4   Title Pt/husband will verbalize understnading of fall prevention in home environment.    Baseline education provided 10/18/20    Time 4    Period Weeks    Status Achieved          New goals for recert:  PT Short Term Goals - 10/18/20 1204      PT SHORT TERM GOAL #1   Title Pt will perform HEP with husband's supervision, for lower extremity strength and transfer safety.  TARGET 11/16/20    Time 4    Period Weeks    Status New      PT SHORT TERM GOAL #2   Title Pt will perform standing at counter/sink for 3 minutes with min guard for improved ADL participation.    Baseline 1:30 sec 10/18/20    Time 4    Period Weeks    Status New            PT Long Term Goals - 10/18/20 1205      PT LONG TERM GOAL #1   Title Pt/husband will demo correct transfer technique for sit<>stand from chair to [employer].  TARGET 12/14/20    Baseline needs min guard and reminder cues on proper technique to stand min A for safety due to uncontrolled descent and pt reporting her head feels heavy in standing.    Time 8    Period Weeks    Status On-going      PT LONG TERM GOAL #2   Title Pt will stand at least 4 minutes with UE support with no episode of retropulsion, for improved standing to participate in ADLs.    Baseline retropulsion upon standing; 3/24:  2 standing bouts of 1:30 each, no retropulsion    Time 8    Period Weeks    Status Revised      PT LONG TERM GOAL #3   Title Pt will ambulate 50-100 ft using UpWalker, with min guard assist, for improved gait efficiency and tolerance in the home.    Baseline 10/18/20:  1.47 ft/sec    Time 48    Period Weeks    Status New                 Plan - 10/18/20 1156    Clinical Impression Statement Pt continues to report fluctuating mobility at home, with bad  days and good days.  No falls in the past week.  Today, pt able to make it through complete session without episodes of festinating/tremors; once she begins to feel fatigued, educated pt to sit, with pt able to exhibit increased control.  Assessed LTGs, with LTG 1 not met, LTG 2 and 3 partially met.  Pt is progressing towards standing tolerance and gait velcoity goal, with improvements noted, just not to goal level.  LTG 4 met for fall prevention education.  Pt and husband report she feels she is getting benefits from therapy sessions and would like to continue more consistently scheduled sessions.  Recert completed today, as pt will continue to beneift from skilled PT to further address transfers, functional strength and activity tolerance to improved safety and decrease fall risk.    Personal Factors and Comorbidities Comorbidity 3+     Comorbidities See full problem list, but of note:  CHF, pacemaker placement, chronic back pain, hx of compression fractures    Examination-Activity Limitations Locomotion Level;Transfers;Hygiene/Grooming;Toileting;Dressing;Stand    Examination-Participation Restrictions Meal Prep;Community Activity    Stability/Clinical Decision Making Unstable/Unpredictable    Rehab Potential Fair    PT Frequency 1x / week    PT Duration 8 weeks   per recert 0/04/2724   PT Treatment/Interventions ADLs/Self Care Home Management;DME Instruction;Neuromuscular re-education;Balance training;Therapeutic exercise;Therapeutic activities;Functional mobility training;Gait training;Patient/family education;Wheelchair mobility training    PT Next Visit Plan any update from the cardiologist regarding BP? Pt agrees to more sessions and recert completed.  Continue to progress sets of seated ex and standing tolerance, gait with UPWalker; transfer safety with pt/husband education    Consulted and Agree with Plan of Care Patient;Family member/caregiver    Family Member Consulted Husband           Patient will benefit from skilled therapeutic intervention in order to improve the following deficits and impairments:  Abnormal gait,Difficulty walking,Decreased safety awareness,Decreased activity tolerance,Decreased balance,Decreased mobility,Decreased strength,Postural dysfunction  Visit Diagnosis: Other abnormalities of gait and mobility  Unsteadiness on feet  Muscle weakness (generalized)  Other symptoms and signs involving the nervous system     Problem List Patient Active Problem List   Diagnosis Date Noted  . Tremor of both hands 07/30/2020  . Left arm swelling 04/10/2020  . Aortic atherosclerosis ([facility]) 03/30/2020  . Cellulitis of arm, left 03/30/2020  . Chronic thoracic back pain 02/03/2020  . Iron deficiency anemia 02/02/2020  . Near syncope 09/27/2019  . Hypertension 09/20/2019  . Acute pain of right knee  08/03/2019  . (HFpEF) heart failure with preserved ejection fraction ([facility]) 08/02/2019  . Pacemaker 07/19/2019  . CKD (chronic kidney disease) 01/30/2019  . Lumbar spinal stenosis 01/10/2018  . Osteoarthritis of both knees 12/15/2017  . Epigastric pain 09/09/2017  . Neuralgia 09/09/2017  . Claudication of calf muscles ([facility]) 11/22/2016  . DOE (dyspnea on exertion) 11/22/2016  . Hyperreflexia of lower extremity 07/16/2016  . Bilateral leg paresthesia 07/16/2016  . Carotid arterial disease ([facility] [facility]) 07/13/2016  . Poor balance 06/23/2016  . Weakness of both lower extremities 06/23/2016  . Sinus node dysfunction ([place]) 05/05/2016  . Right shoulder pain 03/14/2016  . Multinodular goiter (nontoxic) 02/21/2016  . Neck pain 02/15/2016  . Prediabetes 12/17/2015  . Dysphagia 12/17/2015  . T8 vertebral fracture ([facility]) 04/09/2015  . Overweight 10/09/2011  . Allergic rhinitis, cause unspecified   . GERD (gastroesophageal reflux disease) 02/05/2011  . Vitamin B 12 deficiency 04/17/2009  . Hyperlipidemia 04/17/2009  . Osteoporosis 04/17/2009  . ADENOCARCINOMA, BREAST, HX OF  04/17/2009  . CONSTIPATION 03/08/2009  . COLONIC POLYPS, HYPERPLASTIC, HX OF 03/06/2009    Caiya Bettes W. 10/18/2020, 12:03 PM  Frazier Butt., PT   [facility] [address] [place], [place], [address] Phone: [phone]   Fax:  [phone]  Name: [name] MRN: [id number] Date of Birth: [birth year]

## 2020-10-23 ENCOUNTER — Ambulatory Visit: Payer: Medicare Other | Admitting: Physical Therapy

## 2020-10-23 ENCOUNTER — Encounter: Payer: Self-pay | Admitting: Physical Therapy

## 2020-10-23 ENCOUNTER — Other Ambulatory Visit: Payer: Self-pay

## 2020-10-23 ENCOUNTER — Ambulatory Visit: Payer: Medicare Other

## 2020-10-23 VITALS — BP 153/57 | HR 60

## 2020-10-23 DIAGNOSIS — R2689 Other abnormalities of gait and mobility: Secondary | ICD-10-CM | POA: Diagnosis not present

## 2020-10-23 DIAGNOSIS — M6281 Muscle weakness (generalized): Secondary | ICD-10-CM | POA: Diagnosis not present

## 2020-10-23 DIAGNOSIS — R471 Dysarthria and anarthria: Secondary | ICD-10-CM

## 2020-10-23 DIAGNOSIS — R29818 Other symptoms and signs involving the nervous system: Secondary | ICD-10-CM

## 2020-10-23 DIAGNOSIS — R1310 Dysphagia, unspecified: Secondary | ICD-10-CM | POA: Diagnosis not present

## 2020-10-23 DIAGNOSIS — R2681 Unsteadiness on feet: Secondary | ICD-10-CM

## 2020-10-23 NOTE — Therapy (Signed)
[facility] [address] [place], [place], [address] Phone: [phone]   Fax:  [phone]  Speech Language Pathology Treatment  Patient Details  Name: [name] MRN: [id number] Date of Birth: Jun 16, 1935 Referring Provider (SLP): [name], DO   Encounter Date: 10/23/2020   End of Session - 10/23/20 1157    Visit Number 5    Number of Visits 17    Date for SLP Re-Evaluation 12/31/20    SLP Start Time 1150    SLP Stop Time  1230    SLP Time Calculation (min) 40 min    Activity Tolerance Patient tolerated treatment well           Past Medical History:  Diagnosis Date  . Allergy   . Anemia   . Cancer of left breast ([place]) 1978  . CHF (congestive heart failure) ([place])   . Chronic thoracic back pain    "T8; fracture; 03/2015; no OR" (05/05/2016)  . GERD (gastroesophageal reflux disease)   . Heart murmur   . History of hiatal hernia   . Hyperlipidemia   . Hyperplastic colon polyp   . Hypertension   . IBS (irritable bowel syndrome)   . Multiple thyroid nodules   . Osteoporosis    T8 compression fx 03/2015   . Personal history of radiation therapy   . Presence of permanent cardiac pacemaker   . Vitamin B12 deficiency     Past Surgical History:  Procedure Laterality Date  . ANTERIOR CERVICAL DECOMP/DISCECTOMY FUSION  2008   C5-6  . AUGMENTATION MAMMAPLASTY    . BACK SURGERY    . BREAST SURGERY    . CARDIAC CATHETERIZATION  2001  . EP IMPLANTABLE DEVICE N/A 05/05/2016   Procedure: Pacemaker Implant;  Surgeon: Evans Lance, MD;  Location: [facility];  Service: Cardiovascular;  Laterality: N/A;  . EXCISIONAL HEMORRHOIDECTOMY  1984   With subsequent correction of surgery  . INSERT / REPLACE / REMOVE PACEMAKER    . INTRAVASCULAR PRESSURE WIRE/FFR STUDY N/A 10/07/2019   Procedure: INTRAVASCULAR PRESSURE WIRE/FFR STUDY;  Surgeon: Jettie Booze, MD;  Location: [facility];   Service: Cardiovascular;  Laterality: N/A;  . KNEE ARTHROSCOPY Left    "meniscus tear"  . LEFT HEART CATH AND CORONARY ANGIOGRAPHY N/A 10/07/2019   Procedure: LEFT HEART CATH AND CORONARY ANGIOGRAPHY;  Surgeon: Jettie Booze, MD;  Location: [facility];  Service: Cardiovascular;  Laterality: N/A;  . MASTECTOMY Left 1978  . PLACEMENT OF BREAST IMPLANTS Left 1981  . REDUCTION MAMMAPLASTY Right   . SHOULDER ARTHROSCOPY W/ ROTATOR CUFF REPAIR Left 02/2006   "tear"  . TUBAL LIGATION      There were no vitals filed for this visit.   Subjective Assessment - 10/23/20 1151    Subjective "I did good in PT"    Currently in Pain? No/denies                 ADULT SLP TREATMENT - 10/23/20 1152      General Information   Behavior/Cognition Alert;Pleasant mood;Uncooperative      Treatment Provided   Treatment provided Cognitive-Linquistic      Cognitive-Linquistic Treatment   Treatment focused on Dysarthria;Patient/family/caregiver education    Skilled Treatment Pt c/o feeling tired today, with minimal sleep endorsed last night. Pt's husband reports patient is talking louder with occasional episoes of being "raspy." Pt denied having other family members/friends having to ask her to repeat. Pt  pleasant and engaged; however, pt reports dislike of coming to ST as she stated "I don't feel like trying." Overall reduced motivation reported for daily activites, as pt stated "it's a big bother." In conversation, suboptimal volume decayed from mid 60s db to low 60s db. SLP targeted functional phrases with use of compensations, in which pt averaged low 70s db. SLP discussed HEP, in which pt stated "I don't want to add anything else to me. Sometimes I just don't feel like it." Pt only completes HEP on "good days," which occurs ~2 days/week. This session, pt fixated on her husband ailments versus her own condition. SLP reviewed how to functionally apply ST compensations and recommendations for  increased carryover, including breathing breaks in longer conversations. Given reported decreased motivation to maximize communication effectiveness and optimize conversational loudness, upcoming ST discharge may be indicated in a few sessions.      Assessment / Recommendations / Plan   Plan Continue with current plan of care      Progression Toward Goals   Progression toward goals Not progressing toward goals (comment)   patient motivation           SLP Education - 10/23/20 1425    Education Details functional carryover, compensations, level of effort required    Person(s) Educated Patient;Spouse    Methods Explanation;Demonstration    Comprehension Verbalized understanding;Returned demonstration;Need further instruction            SLP Short Term Goals - 10/23/20 1205      SLP SHORT TERM GOAL #1   Title pt will produce loud /a/ or "hey!" with at least mid-upper 80s dB average over three sessions    Time 2    Period Weeks   or 9 sessions for all STGs   Status On-going      SLP SHORT TERM GOAL #2   Title pt will generate abdominal breathing 80% of the time at rest for 5 minutes in 2 sessions    Time 2    Period Weeks    Status On-going      SLP SHORT TERM GOAL #3   Title pt will generate abdominal breathing 80% of the time when answering questions from SLP over 2 sessions    Time 2    Period Weeks    Status On-going            SLP Long Term Goals - 10/23/20 1205      SLP LONG TERM GOAL #1   Title pt will generate abdominal breathing 80% of the time in 8 minutes simple-mod complex conversation with SLP over 2 sessions    Time 6    Period Weeks   or 17 total sessions, for all LTGs   Status On-going      SLP LONG TERM GOAL #2   Title Pt will produce 8 minutes simple-mod complex conversation with WNL volume over three sessions    Time 6    Period Weeks    Status On-going      SLP LONG TERM GOAL #3   Title Pt will produce 8 minutes mod complex conversation with  WNL volume over three sessions    Time 6    Period Weeks    Status On-going      SLP LONG TERM GOAL #4   Title pt will undergo objective swallowing assessment if deemed clinically necessary-signature of DO on this plan of care will be understood as agreement with this judgment by SLP    Time  6    Period Weeks    Status On-going            Plan - 10/23/20 1426    Clinical Impression Statement Pt presents today with 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 Extensive education provided this session to maximize communication effectivness and optmize conversational loudness, as pt stated "I don't feel like trying." SLP provided functional opportunities to practice at home in order to increase buy-in and carryover. See "skilled treatment" for additional details of today's session. Decreased motivation reported to improve speech skills; therefore, upcoming ST discharge may be warranted given reduced motivation endorsed. SLP believes pt will benefit from skilled ST targeting incr'd volume and greater usage of abdomoinal breathing in order to improve communicative effectiveness, and if necessary swallow therapy to improve swallow strength and reduce risk for aspiration.    Speech Therapy Frequency 2x / week    Duration 8 weeks   or 17 total visits   Treatment/Interventions Aspiration precaution training;Pharyngeal strengthening exercises;Diet toleration management by SLP;Trials of upgraded texture/liquids;Internal/external aids;Patient/family education;Compensatory strategies;SLP instruction and feedback;Functional tasks    Potential to Achieve Goals Fair    Potential Considerations Cooperation/participation level;Ability to learn/carryover information    SLP Home Exercise Plan provided    Consulted and Agree with Plan of Care Patient;Family member/caregiver    Family Member Consulted David           Patient will benefit from skilled therapeutic intervention in order to  improve the following deficits and impairments:   Dysarthria and anarthria    Problem List Patient Active Problem List   Diagnosis Date Noted  . Tremor of both hands 07/30/2020  . Left arm swelling 04/10/2020  . Aortic atherosclerosis ([place]) 03/30/2020  . Cellulitis of arm, left 03/30/2020  . Chronic thoracic back pain 02/03/2020  . Iron deficiency anemia 02/02/2020  . Near syncope 09/27/2019  . Hypertension 09/20/2019  . Acute pain of right knee 08/03/2019  . (HFpEF) heart failure with preserved ejection fraction ([place]) 08/02/2019  . Pacemaker 07/19/2019  . CKD (chronic kidney disease) 01/30/2019  . Lumbar spinal stenosis 01/10/2018  . Osteoarthritis of both knees 12/15/2017  . Epigastric pain 09/09/2017  . Neuralgia 09/09/2017  . Claudication of calf muscles ([place]) 11/22/2016  . DOE (dyspnea on exertion) 11/22/2016  . Hyperreflexia of lower extremity 07/16/2016  . Bilateral leg paresthesia 07/16/2016  . Carotid arterial disease ([place]) 07/13/2016  . Poor balance 06/23/2016  . Weakness of both lower extremities 06/23/2016  . Sinus node dysfunction ([place]) 05/05/2016  . Right shoulder pain 03/14/2016  . Multinodular goiter (nontoxic) 02/21/2016  . Neck pain 02/15/2016  . Prediabetes 12/17/2015  . Dysphagia 12/17/2015  . T8 vertebral fracture ([place]) 04/09/2015  . Overweight 10/09/2011  . Allergic rhinitis, cause unspecified   . GERD (gastroesophageal reflux disease) 02/05/2011  . Vitamin B 12 deficiency 04/17/2009  . Hyperlipidemia 04/17/2009  . Osteoporosis 04/17/2009  . ADENOCARCINOMA, BREAST, HX OF 04/17/2009  . CONSTIPATION 03/08/2009  . COLONIC POLYPS, HYPERPLASTIC, HX OF 03/06/2009    Alinda Deem, MA CCC-SLP 10/23/2020, 2:30 PM  [facility] [address] [place], [place], [address] Phone: [phone]   Fax:  [phone]   Name: [name] MRN: [id number] Date of Birth: May 23, 1935

## 2020-10-24 NOTE — Therapy (Signed)
[facility] [address] [place], [place], [address] Phone: [phone]   Fax:  [phone]  Physical Therapy Treatment  Patient Details  Name: [name] MRN: [id number] Date of Birth: 08-27-1934 Referring Provider (PT): Tat, Wells Guiles   Encounter Date: 10/23/2020   PT End of Session - 10/23/20 1110    Visit Number 6    Number of Visits 13    Date for PT Re-Evaluation 69/79/48   recert for 90 days; 8 wk POC (recert completed 0/16/55)   Authorization Type Medicare/AARP    PT Start Time 1105    PT Stop Time 1145    PT Time Calculation (min) 40 min    Equipment Utilized During Treatment Gait belt    Activity Tolerance Patient tolerated treatment well    Behavior During Therapy Flat affect;WFL for tasks assessed/performed           Past Medical History:  Diagnosis Date  . Allergy   . Anemia   . Cancer of left breast ([place]) 1978  . CHF (congestive heart failure) ([place])   . Chronic thoracic back pain    "T8; fracture; 03/2015; no OR" (05/05/2016)  . GERD (gastroesophageal reflux disease)   . Heart murmur   . History of hiatal hernia   . Hyperlipidemia   . Hyperplastic colon polyp   . Hypertension   . IBS (irritable bowel syndrome)   . Multiple thyroid nodules   . Osteoporosis    T8 compression fx 03/2015   . Personal history of radiation therapy   . Presence of permanent cardiac pacemaker   . Vitamin B12 deficiency     Past Surgical History:  Procedure Laterality Date  . ANTERIOR CERVICAL DECOMP/DISCECTOMY FUSION  2008   C5-6  . AUGMENTATION MAMMAPLASTY    . BACK SURGERY    . BREAST SURGERY    . CARDIAC CATHETERIZATION  2001  . EP IMPLANTABLE DEVICE N/A 05/05/2016   Procedure: Pacemaker Implant;  Surgeon: Evans Lance, MD;  Location: [facility];  Service: Cardiovascular;  Laterality: N/A;  . EXCISIONAL HEMORRHOIDECTOMY  1984   With subsequent correction of surgery  . INSERT / REPLACE /  REMOVE PACEMAKER    . INTRAVASCULAR PRESSURE WIRE/FFR STUDY N/A 10/07/2019   Procedure: INTRAVASCULAR PRESSURE WIRE/FFR STUDY;  Surgeon: Jettie Booze, MD;  Location: [facility];  Service: Cardiovascular;  Laterality: N/A;  . KNEE ARTHROSCOPY Left    "meniscus tear"  . LEFT HEART CATH AND CORONARY ANGIOGRAPHY N/A 10/07/2019   Procedure: LEFT HEART CATH AND CORONARY ANGIOGRAPHY;  Surgeon: Jettie Booze, MD;  Location: [facility];  Service: Cardiovascular;  Laterality: N/A;  . MASTECTOMY Left 1978  . PLACEMENT OF BREAST IMPLANTS Left 1981  . REDUCTION MAMMAPLASTY Right   . SHOULDER ARTHROSCOPY W/ ROTATOR CUFF REPAIR Left 02/2006   "tear"  . TUBAL LIGATION      Vitals:   10/23/20 1112  BP: (!) 153/57  Pulse: 60     Subjective Assessment - 10/23/20 1108    Subjective Sunday was really bad with the shakiness.  No falls since last visit.  Using the w/c in the house; didn't sleep at all last night.  Probably still have the shakiness/tremor episodes at least once per day.    Patient is accompained by: Family member   Husband   Pertinent History PMH includes CHF, pacemaker placement 2017, cataract surgery, compression fracture, chronic back pain.    Patient Stated Goals  Pt's goals for therapy are to -Unsure-this is just happening so fast.  Pt agrees to working to lessen fall risk and improve safe mobility.    Currently in Pain? No/denies                             [facility] Adult PT Treatment/Exercise - 10/23/20 1105      Transfers   Transfers Sit to Stand;Stand to [employer]    Sit to Stand 4: Min guard;With upper extremity assist;From bed    Sit to Stand Details Verbal cues for sequencing;Verbal cues for technique    Stand to Sit 4: Min assist;With upper extremity assist;Uncontrolled descent;To bed    Stand to Sit Details (indicate cue type and reason) Tactile cues for initiation;Tactile cues for sequencing;Verbal cues for  sequencing;Verbal cues for technique    Stand Pivot Transfers 4: Min guard   w/c>mat   Transfer Cueing Performs at least 5 reps sit<>stand throughout session    Comments Sit to stand at locked UpWAlker, with standing at locked walker, 2 reps x 30 seconds with min guard.      Ambulation/Gait   Ambulation/Gait Yes    Ambulation/Gait Assistance 4: Min guard;4: Min assist    Ambulation/Gait Assistance Details Using UpWalker in clinic; pt fatigues at end of gait and needs assist to sit.    Ambulation Distance (Feet) 100 Feet    Assistive device 4-wheeled walker;Other (Comment)   UpWalker   Gait Pattern Step-through pattern;Decreased step length - right;Decreased step length - left;Festinating;Trunk flexed;Narrow base of support   whole body tremors at end of gait, when trying to sit.   Ambulation Surface Level;Indoor      Exercises   Exercises Other Exercises    Other Exercises  Seated leg exercises at edge of mat:  marching in place 2 sets x 10, LAQ 2 sets x 10 reps, ankle pumps-heel raises 2 sets x 10, toe raises 2 sets x 10. seated hip flexion (keeping spine straight) to upright posture x10 reps with verbal and tactile cues for scapular retraction.  Seated heelslides, x 2 sets x 10 reps, pillow case under foot.  Seated side step out and in, 10 reps, 2 sets.                    PT Short Term Goals - 10/18/20 1204      PT SHORT TERM GOAL #1   Title Pt will perform HEP with husband's supervision, for lower extremity strength and transfer safety.  TARGET 11/16/20    Time 4    Period Weeks    Status New      PT SHORT TERM GOAL #2   Title Pt will perform standing at counter/sink for 3 minutes with min guard for improved ADL participation.    Baseline 1:30 sec 10/18/20    Time 4    Period Weeks    Status New             PT Long Term Goals - 10/18/20 1205      PT LONG TERM GOAL #1   Title Pt/husband will demo correct transfer technique for sit<>stand from chair to [employer].   TARGET 12/14/20    Baseline needs min guard and reminder cues on proper technique to stand min A for safety due to uncontrolled descent and pt reporting her head feels heavy in standing.    Time 8    Period Weeks  Status On-going      PT LONG TERM GOAL #2   Title Pt will stand at least 4 minutes with UE support with no episode of retropulsion, for improved standing to participate in ADLs.    Baseline retropulsion upon standing; 3/24:  2 standing bouts of 1:30 each, no retropulsion    Time 8    Period Weeks    Status Revised      PT LONG TERM GOAL #3   Title Pt will ambulate 50-100 ft using UpWalker, with min guard assist, for improved gait efficiency and tolerance in the home.    Baseline 10/18/20:  1.47 ft/sec    Time 48    Period Weeks    Status New                 Plan - 10/24/20 1549    Clinical Impression Statement Pt able to increase to 2 sets of all seated exercises in PT session today, with brief rest breaks between.  She is also able to increase gait distance with UpWAlker today.  She continues to demonstrate fatigue, with whole body tremor at end of gait and with fatigue at end of 2 brief standing bouts.  She will continue to benefit from further skilled PT to address functional strength, balance, transfers for improved safety with funcitonal moiblity.    Personal Factors and Comorbidities Comorbidity 3+    Comorbidities See full problem list, but of note:  CHF, pacemaker placement, chronic back pain, hx of compression fractures    Examination-Activity Limitations Locomotion Level;Transfers;Hygiene/Grooming;Toileting;Dressing;Stand    Examination-Participation Restrictions Meal Prep;Community Activity    Stability/Clinical Decision Making Unstable/Unpredictable    Rehab Potential Fair    PT Frequency 1x / week    PT Duration 8 weeks   per recert 1/93/7902   PT Treatment/Interventions ADLs/Self Care Home Management;DME Instruction;Neuromuscular re-education;Balance  training;Therapeutic exercise;Therapeutic activities;Functional mobility training;Gait training;Patient/family education;Wheelchair mobility training    PT Next Visit Plan any update from the cardiologist regarding BP? Continue to progress sets of seated ex and standing tolerance, gait with UPWalker; transfer safety with pt/husband education    Consulted and Agree with Plan of Care Patient;Family member/caregiver    Family Member Consulted Husband           Patient will benefit from skilled therapeutic intervention in order to improve the following deficits and impairments:  Abnormal gait,Difficulty walking,Decreased safety awareness,Decreased activity tolerance,Decreased balance,Decreased mobility,Decreased strength,Postural dysfunction  Visit Diagnosis: Muscle weakness (generalized)  Unsteadiness on feet  Other abnormalities of gait and mobility  Other symptoms and signs involving the nervous system     Problem List Patient Active Problem List   Diagnosis Date Noted  . Tremor of both hands 07/30/2020  . Left arm swelling 04/10/2020  . Aortic atherosclerosis ([place]) 03/30/2020  . Cellulitis of arm, left 03/30/2020  . Chronic thoracic back pain 02/03/2020  . Iron deficiency anemia 02/02/2020  . Near syncope 09/27/2019  . Hypertension 09/20/2019  . Acute pain of right knee 08/03/2019  . (HFpEF) heart failure with preserved ejection fraction ([place]) 08/02/2019  . Pacemaker 07/19/2019  . CKD (chronic kidney disease) 01/30/2019  . Lumbar spinal stenosis 01/10/2018  . Osteoarthritis of both knees 12/15/2017  . Epigastric pain 09/09/2017  . Neuralgia 09/09/2017  . Claudication of calf muscles ([place]) 11/22/2016  . DOE (dyspnea on exertion) 11/22/2016  . Hyperreflexia of lower extremity 07/16/2016  . Bilateral leg paresthesia 07/16/2016  . Carotid arterial disease ([place]) 07/13/2016  . Poor balance 06/23/2016  .  Weakness of both lower extremities 06/23/2016  . Sinus node dysfunction  ([place]) 05/05/2016  . Right shoulder pain 03/14/2016  . Multinodular goiter (nontoxic) 02/21/2016  . Neck pain 02/15/2016  . Prediabetes 12/17/2015  . Dysphagia 12/17/2015  . T8 vertebral fracture ([place]) 04/09/2015  . Overweight 10/09/2011  . Allergic rhinitis, cause unspecified   . GERD (gastroesophageal reflux disease) 02/05/2011  . Vitamin B 12 deficiency 04/17/2009  . Hyperlipidemia 04/17/2009  . Osteoporosis 04/17/2009  . ADENOCARCINOMA, BREAST, HX OF 04/17/2009  . CONSTIPATION 03/08/2009  . COLONIC POLYPS, HYPERPLASTIC, HX OF 03/06/2009    Akul Leggette W. 10/24/2020, 3:52 PM Frazier Butt., PT  [facility] [address] [place], [place], [address] Phone: [phone]   Fax:  [phone]  Name: [name] MRN: [id number] Date of Birth: February 28, 1935

## 2020-10-26 ENCOUNTER — Ambulatory Visit: Payer: Medicare Other

## 2020-10-26 ENCOUNTER — Encounter: Payer: Self-pay | Admitting: Physical Therapy

## 2020-10-26 ENCOUNTER — Other Ambulatory Visit: Payer: Self-pay

## 2020-10-26 ENCOUNTER — Ambulatory Visit: Payer: Medicare Other | Attending: Neurology | Admitting: Physical Therapy

## 2020-10-26 VITALS — BP 136/53 | HR 63

## 2020-10-26 DIAGNOSIS — R471 Dysarthria and anarthria: Secondary | ICD-10-CM

## 2020-10-26 DIAGNOSIS — R2681 Unsteadiness on feet: Secondary | ICD-10-CM

## 2020-10-26 DIAGNOSIS — R1310 Dysphagia, unspecified: Secondary | ICD-10-CM | POA: Diagnosis not present

## 2020-10-26 DIAGNOSIS — R29818 Other symptoms and signs involving the nervous system: Secondary | ICD-10-CM | POA: Insufficient documentation

## 2020-10-26 DIAGNOSIS — M6281 Muscle weakness (generalized): Secondary | ICD-10-CM | POA: Insufficient documentation

## 2020-10-26 DIAGNOSIS — R2689 Other abnormalities of gait and mobility: Secondary | ICD-10-CM | POA: Insufficient documentation

## 2020-10-26 NOTE — Therapy (Signed)
[facility] [address] [place], [place], [address] Phone: [phone]   Fax:  [phone]  Physical Therapy Treatment  Patient Details  Name: [name] MRN: [id number] Date of Birth: [birth year] Referring Provider (PT): Tat, Wells Guiles   Encounter Date: 10/26/2020   PT End of Session - 10/26/20 1205    Visit Number 7    Number of Visits 13    Date for PT Re-Evaluation 94/85/46   recert for 90 days; 8 wk POC (recert completed 2/70/35)   Authorization Type Medicare/AARP    PT Start Time 1108   therapist running late   PT Stop Time 1147    PT Time Calculation (min) 39 min    Equipment Utilized During Treatment Gait belt    Activity Tolerance Patient tolerated treatment well    Behavior During Therapy Flat affect;WFL for tasks assessed/performed           Past Medical History:  Diagnosis Date  . Allergy   . Anemia   . Cancer of left breast ([facility]) 1978  . CHF (congestive heart failure) ([facility])   . Chronic thoracic back pain    "T8; fracture; 03/2015; no OR" (05/05/2016)  . GERD (gastroesophageal reflux disease)   . Heart murmur   . History of hiatal hernia   . Hyperlipidemia   . Hyperplastic colon polyp   . Hypertension   . IBS (irritable bowel syndrome)   . Multiple thyroid nodules   . Osteoporosis    T8 compression fx 03/2015   . Personal history of radiation therapy   . Presence of permanent cardiac pacemaker   . Vitamin B12 deficiency     Past Surgical History:  Procedure Laterality Date  . ANTERIOR CERVICAL DECOMP/DISCECTOMY FUSION  2008   C5-6  . AUGMENTATION MAMMAPLASTY    . BACK SURGERY    . BREAST SURGERY    . CARDIAC CATHETERIZATION  2001  . EP IMPLANTABLE DEVICE N/A 05/05/2016   Procedure: Pacemaker Implant;  Surgeon: Evans Lance, MD;  Location: [facility];  Service: Cardiovascular;  Laterality: N/A;  . EXCISIONAL HEMORRHOIDECTOMY  1984   With subsequent correction of surgery   . INSERT / REPLACE / REMOVE PACEMAKER    . INTRAVASCULAR PRESSURE WIRE/FFR STUDY N/A 10/07/2019   Procedure: INTRAVASCULAR PRESSURE WIRE/FFR STUDY;  Surgeon: Jettie Booze, MD;  Location: [facility];  Service: Cardiovascular;  Laterality: N/A;  . KNEE ARTHROSCOPY Left    "meniscus tear"  . LEFT HEART CATH AND CORONARY ANGIOGRAPHY N/A 10/07/2019   Procedure: LEFT HEART CATH AND CORONARY ANGIOGRAPHY;  Surgeon: Jettie Booze, MD;  Location: [facility];  Service: Cardiovascular;  Laterality: N/A;  . MASTECTOMY Left 1978  . PLACEMENT OF BREAST IMPLANTS Left 1981  . REDUCTION MAMMAPLASTY Right   . SHOULDER ARTHROSCOPY W/ ROTATOR CUFF REPAIR Left 02/2006   "tear"  . TUBAL LIGATION      Vitals:   10/26/20 1118  BP: (!) 136/53  Pulse: 63     Subjective Assessment - 10/26/20 1112    Subjective No changes, no falls. Has been checking the BP at home. Sees Dr. Quay Burow next week. Reports she has had no shaking today.    Patient is accompained by: Family member   Husband   Pertinent History PMH includes CHF, pacemaker placement 2017, cataract surgery, compression fracture, chronic back pain.    Patient Stated Goals Pt's goals for therapy are to -Unsure-this is just happening so  fast.  Pt agrees to working to lessen fall risk and improve safe mobility.    Currently in Pain? No/denies                             [facility] Adult PT Treatment/Exercise - 10/26/20 1113      Transfers   Transfers Sit to Stand;Stand to [employer] Transfers    Sit to Stand 4: Min guard;With upper extremity assist;From bed    Sit to Stand Details Verbal cues for sequencing;Verbal cues for technique    Five time sit to stand comments  cues for proper UE placement    Stand to Sit 4: Min assist;With upper extremity assist;Uncontrolled descent;To bed    Stand to Sit Details (indicate cue type and reason) Tactile cues for initiation;Tactile cues for sequencing;Verbal cues for  sequencing;Verbal cues for technique    Stand to Sit Details cues and assist for slowed descent    Stand Pivot Transfers 4: Min guard   w/c > mat   Transfer Cueing approx 5 reps throughout session      Ambulation/Gait   Ambulation/Gait Yes    Ambulation/Gait Assistance 4: Min guard    Ambulation/Gait Assistance Details Using UpWalker in clinic, no tremors noted during gait today. w/c follow for safety for pt to sit when fatigued    Ambulation Distance (Feet) 115 Feet   x1, 50 x 1   Assistive device 4-wheeled walker;Other (Comment)   UpWalker   Gait Pattern Step-through pattern;Decreased step length - right;Decreased step length - left;Festinating;Trunk flexed;Narrow base of support   BLE tremors when trying to sit   Ambulation Surface Level;Indoor    Pre-Gait Activities standing at [employer] with BUE support: 2 x 5 reps alternating marching, x10 reps wide BOS lateral weight shifting      Exercises   Exercises Other Exercises    Other Exercises  Seated leg exercises at edge of mat:   LAQ 2 sets x 10 reps Seated heelslides, x 2 sets x 10 reps, pillow case under foot.  Seated side step out and in (first set stepping over yardsticks, 2nd stepping over 2" foam beams), 10 reps, 2 sets.                    PT Short Term Goals - 10/18/20 1204      PT SHORT TERM GOAL #1   Title Pt will perform HEP with husband's supervision, for lower extremity strength and transfer safety.  TARGET 11/16/20    Time 4    Period Weeks    Status New      PT SHORT TERM GOAL #2   Title Pt will perform standing at counter/sink for 3 minutes with min guard for improved ADL participation.    Baseline 1:30 sec 10/18/20    Time 4    Period Weeks    Status New             PT Long Term Goals - 10/18/20 1205      PT LONG TERM GOAL #1   Title Pt/husband will demo correct transfer technique for sit<>stand from chair to [employer].  TARGET 12/14/20    Baseline needs min guard and reminder cues on proper  technique to stand min A for safety due to uncontrolled descent and pt reporting her head feels heavy in standing.    Time 8    Period Weeks    Status On-going  PT LONG TERM GOAL #2   Title Pt will stand at least 4 minutes with UE support with no episode of retropulsion, for improved standing to participate in ADLs.    Baseline retropulsion upon standing; 3/24:  2 standing bouts of 1:30 each, no retropulsion    Time 8    Period Weeks    Status Revised      PT LONG TERM GOAL #3   Title Pt will ambulate 50-100 ft using UpWalker, with min guard assist, for improved gait efficiency and tolerance in the home.    Baseline 10/18/20:  1.47 ft/sec    Time 48    Period Weeks    Status New                 Plan - 10/26/20 1217    Clinical Impression Statement Pt able to increase gait distance today to 115' with use of UpWalker and performed an additional short bout of gait at end of session. Pt  still with whole body tremors when going to perform stand > sit at end of gait. Tolerated seated exercises well today. No epsiodes of head fullness throughout session. Will continue to progress towards LTGs.    Personal Factors and Comorbidities Comorbidity 3+    Comorbidities See full problem list, but of note:  CHF, pacemaker placement, chronic back pain, hx of compression fractures    Examination-Activity Limitations Locomotion Level;Transfers;Hygiene/Grooming;Toileting;Dressing;Stand    Examination-Participation Restrictions Meal Prep;Community Activity    Stability/Clinical Decision Making Unstable/Unpredictable    Rehab Potential Fair    PT Frequency 1x / week    PT Duration 8 weeks   per recert 9/37/9024   PT Treatment/Interventions ADLs/Self Care Home Management;DME Instruction;Neuromuscular re-education;Balance training;Therapeutic exercise;Therapeutic activities;Functional mobility training;Gait training;Patient/family education;Wheelchair mobility training    PT Next Visit Plan any  update from the cardiologist regarding BP or from PCP? Continue to progress sets of seated ex and standing tolerance, gait with UPWalker; transfer safety with pt/husband education    Consulted and Agree with Plan of Care Patient;Family member/caregiver    Family Member Consulted Husband           Patient will benefit from skilled therapeutic intervention in order to improve the following deficits and impairments:  Abnormal gait,Difficulty walking,Decreased safety awareness,Decreased activity tolerance,Decreased balance,Decreased mobility,Decreased strength,Postural dysfunction  Visit Diagnosis: Muscle weakness (generalized)  Unsteadiness on feet  Other abnormalities of gait and mobility  Other symptoms and signs involving the nervous system     Problem List Patient Active Problem List   Diagnosis Date Noted  . Tremor of both hands 07/30/2020  . Left arm swelling 04/10/2020  . Aortic atherosclerosis ([facility] [facility]) 03/30/2020  . Cellulitis of arm, left 03/30/2020  . Chronic thoracic back pain 02/03/2020  . Iron deficiency anemia 02/02/2020  . Near syncope 09/27/2019  . Hypertension 09/20/2019  . Acute pain of right knee 08/03/2019  . (HFpEF) heart failure with preserved ejection fraction ([facility]) 08/02/2019  . Pacemaker 07/19/2019  . CKD (chronic kidney disease) 01/30/2019  . Lumbar spinal stenosis 01/10/2018  . Osteoarthritis of both knees 12/15/2017  . Epigastric pain 09/09/2017  . Neuralgia 09/09/2017  . Claudication of calf muscles ([facility]) 11/22/2016  . DOE (dyspnea on exertion) 11/22/2016  . Hyperreflexia of lower extremity 07/16/2016  . Bilateral leg paresthesia 07/16/2016  . Carotid arterial disease ([facility]) 07/13/2016  . Poor balance 06/23/2016  . Weakness of both lower extremities 06/23/2016  . Sinus node dysfunction ([facility]) 05/05/2016  . Right shoulder pain 03/14/2016  .  Multinodular goiter (nontoxic) 02/21/2016  . Neck pain 02/15/2016  . Prediabetes 12/17/2015  . Dysphagia  12/17/2015  . T8 vertebral fracture ([facility]) 04/09/2015  . Overweight 10/09/2011  . Allergic rhinitis, cause unspecified   . GERD (gastroesophageal reflux disease) 02/05/2011  . Vitamin B 12 deficiency 04/17/2009  . Hyperlipidemia 04/17/2009  . Osteoporosis 04/17/2009  . ADENOCARCINOMA, BREAST, HX OF 04/17/2009  . CONSTIPATION 03/08/2009  . COLONIC POLYPS, HYPERPLASTIC, HX OF 03/06/2009    Arliss Journey, PT, DPT  10/26/2020, 12:20 PM  [facility] [address] [place], [place], [address] Phone: [phone]   Fax:  [phone]  Name: ANNINA PIOTROWSKI MRN: [id number] Date of Birth: 1935-02-16

## 2020-10-26 NOTE — Patient Instructions (Signed)
   Practice "hey!" x10 twice a day if you can.   Remember to breath. Use sticky note to help you remind.

## 2020-10-26 NOTE — Therapy (Signed)
[facility] [address] [place], [place], [address] Phone: [phone]   Fax:  [phone]  Speech Language Pathology Treatment  Patient Details  Name: [name] MRN: [id number] Date of Birth: Apr 13, 1935 Referring Provider (SLP): [name], DO   Encounter Date: 10/26/2020   End of Session - 10/26/20 1147    Visit Number 6    Number of Visits 17    Date for SLP Re-Evaluation 12/31/20    SLP Start Time 1150   bathroom break between sessions   SLP Stop Time  1230    SLP Time Calculation (min) 40 min    Activity Tolerance Patient tolerated treatment well           Past Medical History:  Diagnosis Date  . Allergy   . Anemia   . Cancer of left breast ([facility]) 1978  . CHF (congestive heart failure) ([facility])   . Chronic thoracic back pain    "T8; fracture; 03/2015; no OR" (05/05/2016)  . GERD (gastroesophageal reflux disease)   . Heart murmur   . History of hiatal hernia   . Hyperlipidemia   . Hyperplastic colon polyp   . Hypertension   . IBS (irritable bowel syndrome)   . Multiple thyroid nodules   . Osteoporosis    T8 compression fx 03/2015   . Personal history of radiation therapy   . Presence of permanent cardiac pacemaker   . Vitamin B12 deficiency     Past Surgical History:  Procedure Laterality Date  . ANTERIOR CERVICAL DECOMP/DISCECTOMY FUSION  2008   C5-6  . AUGMENTATION MAMMAPLASTY    . BACK SURGERY    . BREAST SURGERY    . CARDIAC CATHETERIZATION  2001  . EP IMPLANTABLE DEVICE N/A 05/05/2016   Procedure: Pacemaker Implant;  Surgeon: Evans Lance, MD;  Location: [facility];  Service: Cardiovascular;  Laterality: N/A;  . EXCISIONAL HEMORRHOIDECTOMY  1984   With subsequent correction of surgery  . INSERT / REPLACE / REMOVE PACEMAKER    . INTRAVASCULAR PRESSURE WIRE/FFR STUDY N/A 10/07/2019   Procedure: INTRAVASCULAR PRESSURE WIRE/FFR STUDY;  Surgeon: Jettie Booze, MD;   Location: [facility];  Service: Cardiovascular;  Laterality: N/A;  . KNEE ARTHROSCOPY Left    "meniscus tear"  . LEFT HEART CATH AND CORONARY ANGIOGRAPHY N/A 10/07/2019   Procedure: LEFT HEART CATH AND CORONARY ANGIOGRAPHY;  Surgeon: Jettie Booze, MD;  Location: [facility];  Service: Cardiovascular;  Laterality: N/A;  . MASTECTOMY Left 1978  . PLACEMENT OF BREAST IMPLANTS Left 1981  . REDUCTION MAMMAPLASTY Right   . SHOULDER ARTHROSCOPY W/ ROTATOR CUFF REPAIR Left 02/2006   "tear"  . TUBAL LIGATION      There were no vitals filed for this visit.   Subjective Assessment - 10/26/20 1149    Subjective "okay"    Patient is accompained by: Family member   Shanon Brow   Currently in Pain? No/denies                 ADULT SLP TREATMENT - 10/26/20 1146      General Information   Behavior/Cognition Alert;Cooperative;Pleasant mood      Treatment Provided   Treatment provided Cognitive-Linquistic;Dysphagia      Dysphagia Treatment   Treatment Methods Patient/caregiver education    Other treatment/comments Pt stated "I'm having trouble" when SLP inquired about current swallow function. Pt endorsed she has some difficulty with water, which occassionally "hurts." SLP reviewed most  recent MBSS in August 2021 and ENT visit in January 2022, with good vocal cord function and WFL swallow function. Reported globus sensation appears secondary to ACDF in 2008. SLP recommended slow rate and small bites/sips to improve tolerance, in which pt verbalized understanding.      Cognitive-Linquistic Treatment   Treatment focused on Dysarthria;Patient/family/caregiver education    Skilled Treatment Pt reports having completed HEP BID as instructed. Pt stated "I think I'm going to try" re: ST recommendations. Pt reported her husband and daughter stated they could hear a difference in her voice. Loud "hey!" completed with range from mid 70s to low 80s with usual cues to improve effort and  volume. In conversation following "hey!" pt averaged mid 78s db. Functional sentences completed and  ranged from upper 60s db to low 70s db, with some difficulty coordinating speaking on exhale. Short structured conversation averaged upper 60s DB, with usual min A for abdominal breathing to reduce hoarseness. SLP emphasized breathing and slightly more effort than normal as to not overwhelm patient which appears to impact motivation/participation.      Assessment / Recommendations / Plan   Plan Continue with current plan of care      Progression Toward Goals   Progression toward goals Progressing toward goals            SLP Education - 10/26/20 1326    Education Details swallow strats, abdominal breathing, positive reinforcement for completing HEP    Person(s) Educated Patient;Spouse    Methods Explanation;Demonstration;Handout    Comprehension Verbalized understanding;Returned demonstration;Need further instruction            SLP Short Term Goals - 10/26/20 1128      SLP SHORT TERM GOAL #1   Title pt will produce loud /a/ or "hey!" with at least mid-upper 80s dB average over three sessions    Baseline lower 80s db 10-26-20    Time 2    Period Weeks   or 9 sessions for all STGs   Status On-going      SLP SHORT TERM GOAL #2   Title pt will generate abdominal breathing 80% of the time at rest for 5 minutes in 2 sessions    Baseline 10-26-20    Time 2    Period Weeks    Status On-going      SLP SHORT TERM GOAL #3   Title pt will generate abdominal breathing 80% of the time when answering questions from SLP over 2 sessions    Time 2    Period Weeks    Status On-going            SLP Long Term Goals - 10/26/20 1129      SLP LONG TERM GOAL #1   Title pt will generate abdominal breathing 80% of the time in 8 minutes simple-mod complex conversation with SLP over 2 sessions    Time 6    Period Weeks   or 17 total sessions, for all LTGs   Status On-going      SLP LONG TERM  GOAL #2   Title Pt will produce 8 minutes simple-mod complex conversation with WNL volume over three sessions    Time 6    Period Weeks    Status On-going      SLP LONG TERM GOAL #3   Title Pt will produce 8 minutes mod complex conversation with WNL volume over three sessions    Time 6    Period Weeks    Status On-going  SLP LONG TERM GOAL #4   Title pt will undergo objective swallowing assessment if deemed clinically necessary-signature of DO on this plan of care will be understood as agreement with this judgment by SLP    Time 6    Period Weeks    Status On-going            Plan - 10/26/20 1327    Clinical Impression Statement Pt presents today with 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 Pt reports she completed her HEP BID as instructed. Pt stated "I think I'm going to try" re: motivation to improve communication skills. SLP continues to simplify yet stress education re: abdominal breathing and slightly more effort than normal, as to not overwhelm patient and reduce her motivation. See "skilled treatment" for additional details of today's session. SLP believes pt will benefit from skilled ST targeting incr'd volume and greater usage of abdomoinal breathing in order to improve communicative effectiveness, and if necessary swallow therapy to improve swallow strength and reduce risk for aspiration.    Speech Therapy Frequency 2x / week    Duration 8 weeks   or 17 visits   Treatment/Interventions Aspiration precaution training;Pharyngeal strengthening exercises;Diet toleration management by SLP;Trials of upgraded texture/liquids;Internal/external aids;Patient/family education;Compensatory strategies;SLP instruction and feedback;Functional tasks    Potential to Achieve Goals Fair    Potential Considerations Cooperation/participation level;Ability to learn/carryover information    SLP Home Exercise Plan provided    Consulted and Agree with Plan of Care  Patient;Family member/caregiver    Family Member Consulted David           Patient will benefit from skilled therapeutic intervention in order to improve the following deficits and impairments:   Dysarthria and anarthria  Dysphagia, unspecified type    Problem List Patient Active Problem List   Diagnosis Date Noted  . Tremor of both hands 07/30/2020  . Left arm swelling 04/10/2020  . Aortic atherosclerosis ([facility]) 03/30/2020  . Cellulitis of arm, left 03/30/2020  . Chronic thoracic back pain 02/03/2020  . Iron deficiency anemia 02/02/2020  . Near syncope 09/27/2019  . Hypertension 09/20/2019  . Acute pain of right knee 08/03/2019  . (HFpEF) heart failure with preserved ejection fraction ([facility]) 08/02/2019  . Pacemaker 07/19/2019  . CKD (chronic kidney disease) 01/30/2019  . Lumbar spinal stenosis 01/10/2018  . Osteoarthritis of both knees 12/15/2017  . Epigastric pain 09/09/2017  . Neuralgia 09/09/2017  . Claudication of calf muscles ([facility]) 11/22/2016  . DOE (dyspnea on exertion) 11/22/2016  . Hyperreflexia of lower extremity 07/16/2016  . Bilateral leg paresthesia 07/16/2016  . Carotid arterial disease ([facility]) 07/13/2016  . Poor balance 06/23/2016  . Weakness of both lower extremities 06/23/2016  . Sinus node dysfunction ([place]) 05/05/2016  . Right shoulder pain 03/14/2016  . Multinodular goiter (nontoxic) 02/21/2016  . Neck pain 02/15/2016  . Prediabetes 12/17/2015  . Dysphagia 12/17/2015  . T8 vertebral fracture ([place]) 04/09/2015  . Overweight 10/09/2011  . Allergic rhinitis, cause unspecified   . GERD (gastroesophageal reflux disease) 02/05/2011  . Vitamin B 12 deficiency 04/17/2009  . Hyperlipidemia 04/17/2009  . Osteoporosis 04/17/2009  . ADENOCARCINOMA, BREAST, HX OF 04/17/2009  . CONSTIPATION 03/08/2009  . COLONIC POLYPS, HYPERPLASTIC, HX OF 03/06/2009    Alinda Deem, MA CCC-SLP 10/26/2020, 1:30 PM  [facility] [address] [place], [place], [address] Phone: [phone]   Fax:  [phone]   Name: CHEVELLE COULSON MRN: [id number] Date of Birth: 09/14/1934

## 2020-10-28 DIAGNOSIS — K589 Irritable bowel syndrome without diarrhea: Secondary | ICD-10-CM | POA: Insufficient documentation

## 2020-10-28 NOTE — Progress Notes (Signed)
Subjective:    Patient ID: [name], female    DOB: 1934-12-17, 86 y.o.   MRN: [id number]  HPI The patient is here for follow up of their chronic medical problems.  She saw Dr Tat and was diagnosed with Parkinsonism.  She is doing PT.    She has intermittent shaking throughout her body.  There is no pattern to when it occurs.    She saw cardiology and her lasix was discontinued and metoprolol dose decreased due to orthostasis, which has improved. She denies leg swelling.    She restarted lexparo 5 mg three months ago.  It may be helping some.  She also saw Dr. Fuller Plan regarding her swallowing and had a couple of tests done.  She does have some esophageal dysmotility and occasional spasms.  She does have difficulty swallowing at times.   Medications and allergies reviewed with patient and updated if appropriate.  Patient Active Problem List   Diagnosis Date Noted  . IBS (irritable bowel syndrome) 10/28/2020  . Tremor of both hands 07/30/2020  . Left arm swelling 04/10/2020  . Aortic atherosclerosis ([place]) 03/30/2020  . Cellulitis of arm, left 03/30/2020  . Chronic thoracic back pain 02/03/2020  . Iron deficiency anemia 02/02/2020  . Near syncope 09/27/2019  . Hypertension 09/20/2019  . Acute pain of right knee 08/03/2019  . (HFpEF) heart failure with preserved ejection fraction ([place]) 08/02/2019  . Pacemaker 07/19/2019  . CKD (chronic kidney disease) 01/30/2019  . Lumbar spinal stenosis 01/10/2018  . Osteoarthritis of both knees 12/15/2017  . Epigastric pain 09/09/2017  . Neuralgia 09/09/2017  . Claudication of calf muscles ([place]) 11/22/2016  . DOE (dyspnea on exertion) 11/22/2016  . Hyperreflexia of lower extremity 07/16/2016  . Bilateral leg paresthesia 07/16/2016  . Carotid arterial disease ([place]) 07/13/2016  . Poor balance 06/23/2016  . Weakness of both lower extremities 06/23/2016  . Sinus node dysfunction ([place]) 05/05/2016  . Right shoulder pain 03/14/2016   . Multinodular goiter (nontoxic) 02/21/2016  . Neck pain 02/15/2016  . Prediabetes 12/17/2015  . Dysphagia 12/17/2015  . T8 vertebral fracture ([place]) 04/09/2015  . Overweight 10/09/2011  . Allergic rhinitis, cause unspecified   . GERD (gastroesophageal reflux disease) 02/05/2011  . Vitamin B 12 deficiency 04/17/2009  . Hyperlipidemia 04/17/2009  . Osteoporosis 04/17/2009  . ADENOCARCINOMA, BREAST, HX OF 04/17/2009  . CONSTIPATION 03/08/2009  . COLONIC POLYPS, HYPERPLASTIC, HX OF 03/06/2009    Current Outpatient Medications on File Prior to Visit  Medication Sig Dispense Refill  . acetaminophen (TYLENOL) 500 MG tablet Take 500 mg by mouth 2 (two) times daily.    . carbidopa-levodopa (SINEMET IR) 25-100 MG tablet Take 1 tablet by mouth 3 (three) times daily. 7am/11am/4pm 270 tablet 1  . cholecalciferol (VITAMIN D) 1000 UNITS tablet Take 2,000 Units by mouth daily.     . Cyanocobalamin 1000 MCG TBCR Take 1,000 mcg by mouth daily.     [name] denosumab (PROLIA) 60 MG/ML SOSY injection Inject 60 mg into the skin every 6 (six) months.    . Diclofenac Sodium (PENNSAID) 2 % SOLN Apply topically.    . diclofenac Sodium (VOLTAREN) 1 % GEL Apply 2 g topically 4 (four) times daily.    [name] escitalopram (LEXAPRO) 5 MG tablet Take 1 tablet (5 mg total) by mouth daily. 30 tablet 5  . ezetimibe (ZETIA) 10 MG tablet Take 1 tablet (10 mg total) by mouth daily. 90 tablet 3  . ferrous sulfate 325 (65 FE) MG tablet  Take 325 mg by mouth 3 (three) times a week.    . furosemide (LASIX) 40 MG tablet Take one tablet in the AM and 1/2 tablet after lunch. 90 tablet 3  . loratadine (CLARITIN) 10 MG tablet Take 10 mg by mouth daily as needed for allergies.    . metoprolol succinate (TOPROL XL) 25 MG 24 hr tablet Take 1 tablet (25 mg total) by mouth daily. (Patient taking differently: Take 12.5 mg by mouth daily.) 90 tablet 3  . omeprazole (PRILOSEC) 40 MG capsule TAKE ONE CAPSULE BY MOUTH TWICE A DAY 60 capsule 11  .  Polyethyl Glycol-Propyl Glycol (LUBRICANT EYE DROPS) 0.4-0.3 % SOLN Place 1 drop into both eyes 3 (three) times daily as needed (dry/irritated eyes.).    [name] polyethylene glycol (MIRALAX / GLYCOLAX) packet Take 17 g by mouth daily as needed (constipation).    . sodium chloride (OCEAN) 0.65 % SOLN nasal spray Place 1 spray into both nostrils 4 (four) times daily as needed for congestion.    . dicyclomine (BENTYL) 10 MG capsule Take 1 capsule (10 mg total) by mouth 3 (three) times daily before meals. (Patient not taking: Reported on 10/29/2020) 90 capsule 11  . potassium chloride (KLOR-CON) 10 MEQ tablet Take 2 tablets (20 mEq total) by mouth daily. 180 tablet 3   No current facility-administered medications on file prior to visit.    Past Medical History:  Diagnosis Date  . Allergy   . Anemia   . Cancer of left breast ([place]) 1978  . CHF (congestive heart failure) ([place])   . Chronic thoracic back pain    "T8; fracture; 03/2015; no OR" (05/05/2016)  . GERD (gastroesophageal reflux disease)   . Heart murmur   . History of hiatal hernia   . Hyperlipidemia   . Hyperplastic colon polyp   . Hypertension   . IBS (irritable bowel syndrome)   . Multiple thyroid nodules   . Osteoporosis    T8 compression fx 03/2015   . Personal history of radiation therapy   . Presence of permanent cardiac pacemaker   . Vitamin B12 deficiency     Past Surgical History:  Procedure Laterality Date  . ANTERIOR CERVICAL DECOMP/DISCECTOMY FUSION  2008   C5-6  . AUGMENTATION MAMMAPLASTY    . BACK SURGERY    . BREAST SURGERY    . CARDIAC CATHETERIZATION  2001  . EP IMPLANTABLE DEVICE N/A 05/05/2016   Procedure: Pacemaker Implant;  Surgeon: Evans Lance, MD;  Location: [facility];  Service: Cardiovascular;  Laterality: N/A;  . EXCISIONAL HEMORRHOIDECTOMY  1984   With subsequent correction of surgery  . INSERT / REPLACE / REMOVE PACEMAKER    . INTRAVASCULAR PRESSURE WIRE/FFR STUDY N/A 10/07/2019   Procedure:  INTRAVASCULAR PRESSURE WIRE/FFR STUDY;  Surgeon: Jettie Booze, MD;  Location: [facility];  Service: Cardiovascular;  Laterality: N/A;  . KNEE ARTHROSCOPY Left    "meniscus tear"  . LEFT HEART CATH AND CORONARY ANGIOGRAPHY N/A 10/07/2019   Procedure: LEFT HEART CATH AND CORONARY ANGIOGRAPHY;  Surgeon: Jettie Booze, MD;  Location: [facility];  Service: Cardiovascular;  Laterality: N/A;  . MASTECTOMY Left 1978  . PLACEMENT OF BREAST IMPLANTS Left 1981  . REDUCTION MAMMAPLASTY Right   . SHOULDER ARTHROSCOPY W/ ROTATOR CUFF REPAIR Left 02/2006   "tear"  . TUBAL LIGATION      Social History   Socioeconomic History  . Marital status: Married    Spouse name: Not  on file  . Number of children: 2  . Years of education: Not on file  . Highest education level: Not on file  Occupational History  . Occupation: retired  Tobacco Use  . Smoking status: Never Smoker  . Smokeless tobacco: Never Used  Vaping Use  . Vaping Use: Never used  Substance and Sexual Activity  . Alcohol use: No    Alcohol/week: 0.0 standard drinks  . Drug use: No  . Sexual activity: Not Currently  Other Topics Concern  . Not on file  Social History Narrative   Housewife, Lives with spouse, 2 children   Social Determinants of Health   Financial Resource Strain: Medium Risk  . Difficulty of Paying Living Expenses: Somewhat hard  Food Insecurity: Not on file  Transportation Needs: Not on file  Physical Activity: Not on file  Stress: Not on file  Social Connections: Not on file    Family History  Problem Relation Age of Onset  . Stroke Sister   . Diabetes Brother   . Breast cancer Maternal Aunt   . Atrial fibrillation Sister   . Colon cancer Neg Hx   . Stomach cancer Neg Hx     Review of Systems  Constitutional: Negative for fever.  Respiratory: Positive for cough. Negative for shortness of breath and wheezing.   Cardiovascular: Negative for chest pain, palpitations and  leg swelling.  Neurological: Negative for light-headedness.       Objective:   Vitals:   10/29/20 1114  BP: 126/80  Pulse: 75  Temp: 98 F (36.7 C)  SpO2: 96%   BP Readings from Last 3 Encounters:  10/29/20 126/80  10/26/20 (!) 136/53  10/23/20 (!) 153/57   Wt Readings from Last 3 Encounters:  10/29/20 154 lb (69.9 kg)  10/05/20 157 lb (71.2 kg)  09/27/20 153 lb (69.4 kg)   Body mass index is 29.1 kg/m.   Physical Exam    Constitutional: Appears well-developed and well-nourished. No distress.  HENT:  Head: Normocephalic and atraumatic.  Neck: Neck supple. No tracheal deviation present. No thyromegaly present.  No cervical lymphadenopathy Cardiovascular: Normal rate, regular rhythm and normal heart sounds.   No murmur heard. No carotid bruit .  No edema Pulmonary/Chest: Effort normal and breath sounds normal. No respiratory distress. No has no wheezes. No rales.  Skin: Skin is warm and dry. Not diaphoretic.  Psychiatric: Normal mood and affect. Behavior is normal.      Assessment & Plan:    See Problem List for Assessment and Plan of chronic medical problems.    This visit occurred during the SARS-CoV-2 public health emergency.  Safety protocols were in place, including screening questions prior to the visit, additional usage of staff PPE, and extensive cleaning of exam room while observing appropriate contact time as indicated for disinfecting solutions.

## 2020-10-28 NOTE — Patient Instructions (Addendum)
   Medications changes include :   none    Please followup in 6 months   

## 2020-10-29 ENCOUNTER — Ambulatory Visit (INDEPENDENT_AMBULATORY_CARE_PROVIDER_SITE_OTHER): Payer: Medicare Other | Admitting: Internal Medicine

## 2020-10-29 ENCOUNTER — Encounter: Payer: Self-pay | Admitting: Internal Medicine

## 2020-10-29 ENCOUNTER — Other Ambulatory Visit: Payer: Self-pay

## 2020-10-29 ENCOUNTER — Ambulatory Visit (INDEPENDENT_AMBULATORY_CARE_PROVIDER_SITE_OTHER): Payer: Medicare Other | Admitting: Pharmacist

## 2020-10-29 VITALS — BP 126/80 | HR 75 | Temp 98.0°F | Ht 61.0 in | Wt 154.0 lb

## 2020-10-29 DIAGNOSIS — I700 Atherosclerosis of aorta: Secondary | ICD-10-CM | POA: Diagnosis not present

## 2020-10-29 DIAGNOSIS — E782 Mixed hyperlipidemia: Secondary | ICD-10-CM

## 2020-10-29 DIAGNOSIS — F419 Anxiety disorder, unspecified: Secondary | ICD-10-CM | POA: Diagnosis not present

## 2020-10-29 DIAGNOSIS — K219 Gastro-esophageal reflux disease without esophagitis: Secondary | ICD-10-CM

## 2020-10-29 DIAGNOSIS — K589 Irritable bowel syndrome without diarrhea: Secondary | ICD-10-CM

## 2020-10-29 DIAGNOSIS — M810 Age-related osteoporosis without current pathological fracture: Secondary | ICD-10-CM

## 2020-10-29 DIAGNOSIS — G20 Parkinson's disease: Secondary | ICD-10-CM

## 2020-10-29 NOTE — Progress Notes (Signed)
Chronic Care Management Pharmacy Note  10/29/2020 Name:  [name] MRN:  [id number] DOB:  06/25/1935  Subjective: [name] is an 86 y.o. year old female who is a primary patient of Burns, Claudina Lick, MD.  The CCM team was consulted for assistance with disease management and care coordination needs.    Engaged with patient by telephone for follow up visit in response to provider referral for pharmacy case management and/or care coordination services.   Consent to Services:  The patient was given information about Chronic Care Management services, agreed to services, and gave verbal consent prior to initiation of services.  Please see initial visit note for detailed documentation.   Patient Care Team: [name], MD as PCP - General (Internal Medicine) Evans Lance, MD as PCP - Cardiology (Cardiology) Ladene Artist, MD (Gastroenterology) Servando Salina, MD (Obstetrics and Gynecology) Melida Quitter, MD as Consulting Physician (Otolaryngology) Allyn Kenner, MD (Dermatology) Calvert Cantor, MD (Ophthalmology) Berle Mull, MD (Sports Medicine) Jovita Gamma, MD as Consulting Physician (Neurosurgery) Evans Lance, MD as Consulting Physician (Cardiology) [place], Peter M, MD as Consulting Physician (Cardiology) Charlton Haws, [facility] as Pharmacist (Pharmacist)  Recent office visits: 10/29/20 Dr Dorette Grate  Recent consult visits: PT for dysarthria and muscle weakness 10/05/20 NP Lynnell Jude (cardiology): continue to hold lasix, 1/2 metoprolol dose. May consider midodrine if needed. 09/28/20 Dr Lovena Le - advised stopping lasix and cut metoprolol in half. 09/27/20 Dr Tat (neurology): f/u Parkinson's. C/o orthostasis. Referred to cardiology to change BP meds, possibly Lasix.  08/23/20 Dr Tat (neurology): Dx Parkinson's. Rx'd Levodopa. Referred for PT and speech therapy.  Hospital visits: None in previous 6 months  Objective:  Lab Results  Component Value Date    CREATININE 0.92 10/05/2020   BUN 12 10/05/2020   GFR 44.65 (L) 12/27/2019   GFRNONAA 38 (L) 08/30/2020   GFRAA 44 (L) 08/30/2020   NA 135 10/05/2020   K 4.9 10/05/2020   CALCIUM 8.5 (L) 10/05/2020   CO2 21 10/05/2020   GLUCOSE 87 10/05/2020    Lab Results  Component Value Date/Time   HGBA1C 5.9 (H) 02/03/2020 10:46 AM   HGBA1C 6.2 08/03/2019 09:49 AM   GFR 44.65 (L) 12/27/2019 08:10 AM   GFR 47.03 (L) 08/03/2019 09:49 AM    Last diabetic Eye exam: No results found for: HMDIABEYEEXA  Last diabetic Foot exam: No results found for: HMDIABFOOTEX   Lab Results  Component Value Date   CHOL 215 (H) 02/03/2020   HDL 58 02/03/2020   LDLCALC 132 (H) 02/03/2020   LDLDIRECT 164.6 12/13/2012   TRIG 133 02/03/2020   CHOLHDL 3.7 02/03/2020    Hepatic Function Latest Ref Rng & Units 03/30/2020 02/03/2020 12/27/2019  Total Protein 6.1 - 8.1 g/dL 6.8 6.5 6.1  Albumin 3.5 - 5.2 g/dL - - -  AST 10 - 35 U/L 11 11 -  ALT 6 - 29 U/L 8 10 -  Alk Phosphatase 39 - 117 U/L - - -  Total Bilirubin 0.2 - 1.2 mg/dL 0.4 0.4 -  Bilirubin, Direct 0.0 - 0.3 mg/dL - - -    Lab Results  Component Value Date/Time   TSH 1.85 02/03/2020 10:46 AM   TSH 2.25 12/27/2019 08:10 AM    CBC Latest Ref Rng & Units 10/05/2020 03/30/2020 02/03/2020  WBC 3.4 - 10.8 x10E3/uL 6.2 7.8 7.2  Hemoglobin 11.1 - 15.9 g/dL 10.8(L) 12.0 11.0(L)  Hematocrit 34.0 - 46.6 % 31.5(L) 36.3 33.2(L)  Platelets 150 -  450 x10E3/uL 320 333 294    Lab Results  Component Value Date/Time   VD25OH 42 02/03/2020 10:46 AM   VD25OH 73.89 12/27/2019 08:10 AM   VD25OH 64.65 11/20/2016 12:02 PM    Clinical ASCVD: No  The ASCVD Risk score Mikey Bussing DC Jr., et al., 2013) failed to calculate for the following reasons:   The 2013 ASCVD risk score is only valid for ages 57 to 8    Depression screen PHQ 2/9 02/03/2020 02/02/2019 01/31/2019  Decreased Interest 0 0 0  Down, Depressed, Hopeless 0 0 0  PHQ - 2 Score 0 0 0  Some recent data might be hidden      Social History   Tobacco Use  Smoking Status Never Smoker  Smokeless Tobacco Never Used   BP Readings from Last 3 Encounters:  10/29/20 126/80  10/26/20 (!) 136/53  10/23/20 (!) 153/57   Pulse Readings from Last 3 Encounters:  10/29/20 75  10/26/20 63  10/23/20 60   Wt Readings from Last 3 Encounters:  10/29/20 154 lb (69.9 kg)  10/05/20 157 lb (71.2 kg)  09/27/20 153 lb (69.4 kg)   BMI Readings from Last 3 Encounters:  10/29/20 29.10 kg/m  10/05/20 29.66 kg/m  09/27/20 28.91 kg/m    Assessment/Interventions: Review of patient past medical history, allergies, medications, health status, including review of consultants reports, laboratory and other test data, was performed as part of comprehensive evaluation and provision of chronic care management services.   SDOH:  (Social Determinants of Health) assessments and interventions performed: Yes  SDOH Screenings   Alcohol Screen: Not on file  Depression (PHQ2-9): Low Risk   . PHQ-2 Score: 0  Financial Resource Strain: Medium Risk  . Difficulty of Paying Living Expenses: Somewhat hard  Food Insecurity: Not on file  Housing: Not on file  Physical Activity: Not on file  Social Connections: Not on file  Stress: Not on file  Tobacco Use: Low Risk   . Smoking Tobacco Use: Never Smoker  . Smokeless Tobacco Use: Never Used  Transportation Needs: Not on file    CCM Care Plan  Allergies  Allergen Reactions  . Statins     Lipitor caused hospitalization - - depletion of electrolytes, fever, nausea, loss of appetite, couldn't get out of bed  . Cymbalta [Duloxetine Hcl] Other (See Comments)    Dulled her too much, difficulty urinating, change in vision  . Fosamax [Alendronate Sodium] Other (See Comments)    Caused chronic issues swallowing  . Amitriptyline     hallucinations  . Lyrica [Pregabalin]     Unknown  . Neurontin [Gabapentin] Other (See Comments)    Dizziness and sedation (patient is tolerating in lower  dose)  . Zanaflex [Tizanidine] Other (See Comments)    Could not sleep    Medications Reviewed Today    Reviewed by [name], MD (Physician) on 10/29/20 at 1124  Med List Status: <None>  Medication Order Taking? Sig Documenting Provider Last Dose Status Informant  acetaminophen (TYLENOL) 500 MG tablet [id number] Yes Take 500 mg by mouth 2 (two) times daily. [provider] Taking Active Self  carbidopa-levodopa (SINEMET IR) 25-100 MG tablet [id number] Yes Take 1 tablet by mouth 3 (three) times daily. 7am/11am/4pm Tat, Eustace Quail, DO Taking Active   cholecalciferol (VITAMIN D) 1000 UNITS tablet [id number] Yes Take 2,000 Units by mouth daily.  [provider] Taking Active Self  Cyanocobalamin 1000 MCG TBCR [id number] Yes Take 1,000 mcg by mouth daily.  [provider] Taking Active Self           Med Note Tammi Klippel, Nadara Mustard   Fri May 02, 2016  9:59 AM)    denosumab (PROLIA) 60 MG/ML SOSY injection [id number] Yes Inject 60 mg into the skin every 6 (six) months. [provider] Taking Active   Diclofenac Sodium (PENNSAID) 2 % SOLN [id number] Yes Apply topically. [provider] Taking Active   diclofenac Sodium (VOLTAREN) 1 % GEL [id number] Yes Apply 2 g topically 4 (four) times daily. [provider] Taking Active   dicyclomine (BENTYL) 10 MG capsule [id number] No Take 1 capsule (10 mg total) by mouth 3 (three) times daily before meals.  Patient not taking: Reported on 10/29/2020   Ladene Artist, MD Not Taking Active   escitalopram (LEXAPRO) 5 MG tablet [id number] Yes Take 1 tablet (5 mg total) by mouth daily. [name], MD Taking Active   ezetimibe (ZETIA) 10 MG tablet [id number] Yes Take 1 tablet (10 mg total) by mouth daily. [name], MD Taking Active   ferrous sulfate 325 (65 FE) MG tablet [id number] Yes Take 325 mg by mouth 3 (three) times a week. [provider] Taking Active   furosemide (LASIX) 40 MG tablet [id number] Yes  Take one tablet in the AM and 1/2 tablet after lunch. Evans Lance, MD Taking Active   loratadine (CLARITIN) 10 MG tablet [id number] Yes Take 10 mg by mouth daily as needed for allergies. [provider] Taking Active Self  metoprolol succinate (TOPROL XL) 25 MG 24 hr tablet [id number] Yes Take 1 tablet (25 mg total) by mouth daily.  Patient taking differently: Take 12.5 mg by mouth daily.   Evans Lance, MD Taking Active   omeprazole (PRILOSEC) 40 MG capsule [id number] Yes TAKE ONE CAPSULE BY MOUTH TWICE A DAY Ladene Artist, MD Taking Active   Polyethyl Glycol-Propyl Glycol (LUBRICANT EYE DROPS) 0.4-0.3 % SOLN [id number] Yes Place 1 drop into both eyes 3 (three) times daily as needed (dry/irritated eyes.). [provider] Taking Active Self  polyethylene glycol (MIRALAX / GLYCOLAX) packet [id number] Yes Take 17 g by mouth daily as needed (constipation). [provider] Taking Active Self  potassium chloride (KLOR-CON) 10 MEQ tablet [id number]  Take 2 tablets (20 mEq total) by mouth daily. Evans Lance, MD  Expired 07/18/20 2359   sodium chloride (OCEAN) 0.65 % SOLN nasal spray [id number] Yes Place 1 spray into both nostrils 4 (four) times daily as needed for congestion. [provider] Taking Active Self          Patient Active Problem List   Diagnosis Date Noted  . IBS (irritable bowel syndrome) 10/28/2020  . Tremor of both hands 07/30/2020  . Left arm swelling 04/10/2020  . Aortic atherosclerosis ([place]) 03/30/2020  . Cellulitis of arm, left 03/30/2020  . Chronic thoracic back pain 02/03/2020  . Iron deficiency anemia 02/02/2020  . Near syncope 09/27/2019  . Hypertension 09/20/2019  . Acute pain of right knee 08/03/2019  . (HFpEF) heart failure with preserved ejection fraction ([place]) 08/02/2019  . Pacemaker 07/19/2019  . CKD (chronic kidney disease) 01/30/2019  . Lumbar spinal stenosis 01/10/2018  . Osteoarthritis of both knees 12/15/2017  .  Epigastric pain 09/09/2017  . Neuralgia 09/09/2017  . Claudication of calf muscles ([place]) 11/22/2016  . DOE (dyspnea on exertion) 11/22/2016  . Hyperreflexia of lower extremity 07/16/2016  . Bilateral leg paresthesia 07/16/2016  . Carotid arterial disease ([place]) 07/13/2016  . Poor  balance 06/23/2016  . Weakness of both lower extremities 06/23/2016  . Sinus node dysfunction ([place]) 05/05/2016  . Right shoulder pain 03/14/2016  . Multinodular goiter (nontoxic) 02/21/2016  . Neck pain 02/15/2016  . Prediabetes 12/17/2015  . Dysphagia 12/17/2015  . Anxiety 11/13/2015  . T8 vertebral fracture ([place]) 04/09/2015  . Overweight 10/09/2011  . Allergic rhinitis, cause unspecified   . GERD (gastroesophageal reflux disease) 02/05/2011  . Vitamin B 12 deficiency 04/17/2009  . Hyperlipidemia 04/17/2009  . Osteoporosis 04/17/2009  . ADENOCARCINOMA, BREAST, HX OF 04/17/2009  . CONSTIPATION 03/08/2009  . COLONIC POLYPS, HYPERPLASTIC, HX OF 03/06/2009    Immunization History  Administered Date(s) Administered  . Fluad Quad(high Dose 65+) 05/17/2019, 05/26/2020  . Influenza Split 06/27/2011, 06/09/2012  . Influenza Whole 07/09/2009, 07/02/2010  . Influenza, High Dose Seasonal PF 05/02/2013, 05/31/2014, 06/26/2015, 06/02/2016, 05/22/2017, 05/17/2018  . PFIZER(Purple Top)SARS-COV-2 Vaccination 09/10/2019, 10/03/2019, 07/13/2020  . Pneumococcal Conjugate-13 06/19/2014  . Pneumococcal Polysaccharide-23 07/28/2000, 10/09/2011  . Td 07/28/2000  . Tdap 10/15/2010  . Zoster 03/12/2011    Conditions to be addressed/monitored:  GERD and IBS, Parkinson's disease  Care Plan : CCM Pharmacy Care Plan  Updates made by Charlton Haws, [place] since 10/29/2020 12:00 AM    Problem: Parkinson's Disease, IBS, GERD   Priority: High    Long-Range Goal: Disease management   Start Date: 10/29/2020  Expected End Date: 04/30/2021  This Visit's Progress: On track  Priority: High  Note:   Current Barriers:   . Unable to independently monitor therapeutic efficacy . Unsure of how to self administer medications as prescribed  Pharmacist Clinical Goal(s):  [name] Patient will achieve adherence to monitoring guidelines and medication adherence to achieve therapeutic efficacy . achieve ability to self administer medications as prescribed through use of pill box as evidenced by patient report through collaboration with PharmD and provider.   Interventions: . 1:1 collaboration with [name], MD regarding development and update of comprehensive plan of care as evidenced by provider attestation and co-signature . Inter-disciplinary care team collaboration (see longitudinal plan of care) . Comprehensive medication review performed; medication list updated in electronic medical record  Medication management  -Not ideally controlled - pt has 3 medications that are taken multiple times per day and have to be taken before meals, she is not sure how to take them -Current treatment  . Carbidopa-Levodopa 25-100 mg TID (7am/11am/4pm) . Dicyclomine 10 mg TID w/ meals . Omeprazole 40 mg BID before meals -Counseled patient it is ok to take all medications together 30 min before meals. These medications do not interfere with absorption/action of each other and are safe to take together -Recommend to continue current medication  Patient Goals/Self-Care Activities . Patient will:  - take medications as prescribed focus on medication adherence by pill box  Follow Up Plan: Telephone follow up appointment with care management team member scheduled for: 6 months      Medication Assistance: None required.  Patient affirms current coverage meets needs.  Patient's preferred pharmacy is:  [facility] - [place], [place] - [address] [place] [address] Phone: [phone] Fax: [phone]  Uses pill box? Yes Pt endorses 100% compliance  We  discussed: Current pharmacy is preferred with insurance plan and patient is satisfied with pharmacy services Patient decided to: Continue current medication management strategy  Care Plan and Follow Up Patient Decision:  Patient agrees to Care Plan and Follow-up.  Plan: Telephone follow up  appointment with care management team member scheduled for:  6 months  Charlene Brooke, PharmD, [place], CPP Clinical Pharmacist [facility] [phone]

## 2020-10-29 NOTE — Assessment & Plan Note (Signed)
Chronic Continue Prolia every 6 months Unfortunately not exercising regularly, but she is doing physical therapy and hopefully will become more active Continue vitamin D daily.  Not taking calcium secondary to stomach upset DEXA due-ordered

## 2020-10-29 NOTE — Assessment & Plan Note (Signed)
Chronic Restarted Lexapro 5 mg daily at her last visit-may be helping some Continue Lexapro 5 mg daily

## 2020-10-29 NOTE — Patient Instructions (Signed)
Visit Information  Phone number for Pharmacist: [phone]  Goals Addressed   None    Patient Care Plan: CCM Pharmacy Care Plan    Problem Identified: Parkinson's Disease, IBS, GERD   Priority: High    Long-Range Goal: Disease management   Start Date: 10/29/2020  Expected End Date: 04/30/2021  This Visit's Progress: On track  Priority: High  Note:   Current Barriers:  . Unable to independently monitor therapeutic efficacy . Unsure of how to self administer medications as prescribed  Pharmacist Clinical Goal(s):  [name] Patient will achieve adherence to monitoring guidelines and medication adherence to achieve therapeutic efficacy . achieve ability to self administer medications as prescribed through use of pill box as evidenced by patient report through collaboration with PharmD and provider.   Interventions: . 1:1 collaboration with [name], MD regarding development and update of comprehensive plan of care as evidenced by provider attestation and co-signature . Inter-disciplinary care team collaboration (see longitudinal plan of care) . Comprehensive medication review performed; medication list updated in electronic medical record  Medication management  -Not ideally controlled - pt has 3 medications that are taken multiple times per day and have to be taken before meals, she is not sure how to take them -Current treatment  . Carbidopa-Levodopa 25-100 mg TID (7am/11am/4pm) . Dicyclomine 10 mg TID w/ meals . Omeprazole 40 mg BID before meals -Counseled patient it is ok to take all medications together 30 min before meals. These medications do not interfere with absorption/action of each other and are safe to take together -Recommend to continue current medication  Patient Goals/Self-Care Activities . Patient will:  - take medications as prescribed focus on medication adherence by pill box  Follow Up Plan: Telephone follow up appointment with care management team member  scheduled for: 6 months     Patient verbalizes understanding of instructions provided today and agrees to view in [place].  Telephone follow up appointment with pharmacy team member scheduled for: 6 months  Charlene Brooke, PharmD, [place], CPP Clinical Pharmacist [facility] [phone]

## 2020-10-29 NOTE — Assessment & Plan Note (Signed)
Chronic Continue Zetia 10 mg daily 

## 2020-10-29 NOTE — Assessment & Plan Note (Signed)
Chronic Did not tolerate statins Continue Zetia 10 mg daily Encourage regular exercise, healthy diet

## 2020-10-30 ENCOUNTER — Ambulatory Visit: Payer: Medicare Other

## 2020-10-30 ENCOUNTER — Ambulatory Visit: Payer: Medicare Other | Admitting: Physical Therapy

## 2020-10-30 ENCOUNTER — Encounter: Payer: Self-pay | Admitting: Physical Therapy

## 2020-10-30 VITALS — BP 150/50 | HR 61

## 2020-10-30 DIAGNOSIS — R2681 Unsteadiness on feet: Secondary | ICD-10-CM

## 2020-10-30 DIAGNOSIS — R29818 Other symptoms and signs involving the nervous system: Secondary | ICD-10-CM | POA: Diagnosis not present

## 2020-10-30 DIAGNOSIS — R1310 Dysphagia, unspecified: Secondary | ICD-10-CM | POA: Diagnosis not present

## 2020-10-30 DIAGNOSIS — R471 Dysarthria and anarthria: Secondary | ICD-10-CM

## 2020-10-30 DIAGNOSIS — R2689 Other abnormalities of gait and mobility: Secondary | ICD-10-CM

## 2020-10-30 DIAGNOSIS — M6281 Muscle weakness (generalized): Secondary | ICD-10-CM

## 2020-10-30 LAB — CUP PACEART REMOTE DEVICE CHECK
Battery Remaining Longevity: 114 mo
Battery Remaining Percentage: 100 %
Brady Statistic RA Percent Paced: 59 %
Brady Statistic RV Percent Paced: 0 %
Date Time Interrogation Session: 20220405030100
Implantable Lead Implant Date: 20171009
Implantable Lead Implant Date: 20171009
Implantable Lead Location: 753859
Implantable Lead Location: 753860
Implantable Lead Model: 5076
Implantable Lead Model: 7741
Implantable Lead Serial Number: 785430
Implantable Pulse Generator Implant Date: 20171009
Lead Channel Impedance Value: 1874 Ohm
Lead Channel Impedance Value: 348 Ohm
Lead Channel Pacing Threshold Amplitude: 2.4 V
Lead Channel Pacing Threshold Pulse Width: 0.4 ms
Lead Channel Setting Pacing Amplitude: 2 V
Lead Channel Setting Pacing Amplitude: 2.5 V
Lead Channel Setting Pacing Pulse Width: 0.4 ms
Lead Channel Setting Sensing Sensitivity: 2.5 mV
Pulse Gen Serial Number: 766467

## 2020-10-30 NOTE — Therapy (Signed)
[facility] [address] [place], [place], [address] Phone: [phone]   Fax:  [phone]  Speech Language Pathology Treatment/discharge summary  Patient Details  Name: [name] MRN: [id number] Date of Birth: 1934-08-06 Referring Provider (SLP): [name], DO   Encounter Date: 10/30/2020   End of Session - 10/30/20 1249    Visit Number 7    Number of Visits 17    Date for SLP Re-Evaluation 12/31/20    SLP Start Time 1150    SLP Stop Time  1228    SLP Time Calculation (min) 38 min    Activity Tolerance Patient tolerated treatment well           Past Medical History:  Diagnosis Date  . Allergy   . Anemia   . Cancer of left breast ([place]) 1978  . CHF (congestive heart failure) ([place])   . Chronic thoracic back pain    "T8; fracture; 03/2015; no OR" (05/05/2016)  . GERD (gastroesophageal reflux disease)   . Heart murmur   . History of hiatal hernia   . Hyperlipidemia   . Hyperplastic colon polyp   . Hypertension   . IBS (irritable bowel syndrome)   . Multiple thyroid nodules   . Osteoporosis    T8 compression fx 03/2015   . Personal history of radiation therapy   . Presence of permanent cardiac pacemaker   . Vitamin B12 deficiency     Past Surgical History:  Procedure Laterality Date  . ANTERIOR CERVICAL DECOMP/DISCECTOMY FUSION  2008   C5-6  . AUGMENTATION MAMMAPLASTY    . BACK SURGERY    . BREAST SURGERY    . CARDIAC CATHETERIZATION  2001  . EP IMPLANTABLE DEVICE N/A 05/05/2016   Procedure: Pacemaker Implant;  Surgeon: Evans Lance, MD;  Location: [facility];  Service: Cardiovascular;  Laterality: N/A;  . EXCISIONAL HEMORRHOIDECTOMY  1984   With subsequent correction of surgery  . INSERT / REPLACE / REMOVE PACEMAKER    . INTRAVASCULAR PRESSURE WIRE/FFR STUDY N/A 10/07/2019   Procedure: INTRAVASCULAR PRESSURE WIRE/FFR STUDY;  Surgeon: Jettie Booze, MD;  Location: [facility];  Service: Cardiovascular;  Laterality: N/A;  . KNEE ARTHROSCOPY Left    "meniscus tear"  . LEFT HEART CATH AND CORONARY ANGIOGRAPHY N/A 10/07/2019   Procedure: LEFT HEART CATH AND CORONARY ANGIOGRAPHY;  Surgeon: Jettie Booze, MD;  Location: [facility];  Service: Cardiovascular;  Laterality: N/A;  . MASTECTOMY Left 1978  . PLACEMENT OF BREAST IMPLANTS Left 1981  . REDUCTION MAMMAPLASTY Right   . SHOULDER ARTHROSCOPY W/ ROTATOR CUFF REPAIR Left 02/2006   "tear"  . TUBAL LIGATION      There were no vitals filed for this visit.   SPEECH THERAPY DISCHARGE SUMMARY  Visits from Start of Care: 7  Current functional level related to goals / functional outcomes: See goals below. Pt with decr'd motivation due to pt's stress from time necessary to arrive to and complete herapy appointments.Pt rec'd swallow strategies in last session. SLP suggested pt d/c remaining ST and re-eval ST after d/c from PT. Pt agreed.    Remaining deficits: Hypophonia, dysphagia.   Education / Equipment: HEP, swallow strategies.   Plan: Patient agrees to discharge.  Patient goals were not met. Patient is being discharged due to the physician's request.  ?????        Subjective Assessment - 10/30/20 1155    Subjective "I feel like it's  a whole lot to come. We try to take appointments where it doesn't take [place] more than 10 minutes - it's close to 30 to get here."    Patient is accompained by: Family member   Shanon Brow - husband   Currently in Pain? No/denies                 ADULT SLP TREATMENT - 10/30/20 1202      General Information   Behavior/Cognition Alert;Cooperative;Pleasant mood      Treatment Provided   Treatment provided Cognitive-Linquistic      Cognitive-Linquistic Treatment   Treatment focused on Dysarthria;Patient/family/caregiver education    Skilled Treatment Pt expresses concerns about her and husband's time it takes to come to therapy. SLP suggested she and  husband talk about options presented (one at a time therapy, or two at a time, then the other therapy, etc). Shanon Brow suggested maybe pt and he to move into assisted living - SLP thought that would be a good conversation to have with the RN who comes to the house, from [employer]. Pt's loudness level was in mid 60s dB throughout the 20 minute conversation. Pt's loudness decr'd to low to mid 60s dB after 17 minutes of conversation. Loud "hey!" average mid 80s dB. Pt forgot her sentences so she did not complete. Pt and husband decided to complete PT and then re-eval with ST after d/c from PT. Pt will be d/c'd from ST at this time.      Assessment / Recommendations / Plan   Plan Discharge SLP treatment due to (comment)   pt request - re-eval ST after d/c from PT     Progression Toward Goals   Progression toward goals --   d/c da- see goals           SLP Education - 10/30/20 1249    Education Details options for PT and ST freqeuncy    Person(s) Educated Patient    Methods Explanation    Comprehension Verbalized understanding            SLP Short Term Goals - 10/30/20 1252      SLP SHORT TERM GOAL #1   Title pt will produce loud /a/ or "hey!" with at least mid-upper 80s dB average over three sessions    Baseline lower 80s db 10-26-20    Time 2    Period Weeks   or 9 sessions for all STGs   Status Not Met      SLP SHORT TERM GOAL #2   Title pt will generate abdominal breathing 80% of the time at rest for 5 minutes in 2 sessions    Baseline 10-26-20    Time 2    Period Weeks    Status Not Met      SLP SHORT TERM GOAL #3   Title pt will generate abdominal breathing 80% of the time when answering questions from SLP over 2 sessions    Time 2    Period Weeks    Status Not Met            SLP Long Term Goals - 10/30/20 1252      SLP LONG TERM GOAL #1   Title pt will generate abdominal breathing 80% of the time in 8 minutes simple-mod complex conversation with SLP over 2 sessions     Time 6    Period Weeks   or 17 total sessions, for all LTGs   Status Not Met  SLP LONG TERM GOAL #2   Title Pt will produce 8 minutes simple-mod complex conversation with WNL volume over three sessions    Time 6    Period Weeks    Status Not Met      SLP LONG TERM GOAL #3   Title Pt will produce 8 minutes mod complex conversation with WNL volume over three sessions    Time 6    Period Weeks    Status Not Met      SLP LONG TERM GOAL #4   Title pt will undergo objective swallowing assessment if deemed clinically necessary-signature of DO on this plan of care will be understood as agreement with this judgment by SLP    Time 6    Period Weeks    Status Not Met            Plan - 10/30/20 1249    Clinical Impression Statement Pt presents today with 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 Pt reports she completed her HEP mostly as instructed. Pt stated "I think I'm going to try" re: motivation to improve communication skills. Pt and husband desire to d/c ST at this time and then re-evaluate following d/c from PT, to lessen burden of time on pt/husband. See "skilled treatment" for additional details of today's session.    Duration --   or 17 visits   Treatment/Interventions Aspiration precaution training;Pharyngeal strengthening exercises;Diet toleration management by SLP;Trials of upgraded texture/liquids;Internal/external aids;Patient/family education;Compensatory strategies;SLP instruction and feedback;Functional tasks    Potential to Achieve Goals Fair    Potential Considerations Cooperation/participation level;Ability to learn/carryover information    SLP Home Exercise Plan provided    Consulted and Agree with Plan of Care Patient;Family member/caregiver    Family Member Consulted David           Patient will benefit from skilled therapeutic intervention in order to improve the following deficits and impairments:   Dysarthria and anarthria  Dysphagia,  unspecified type    Problem List Patient Active Problem List   Diagnosis Date Noted  . IBS (irritable bowel syndrome) 10/28/2020  . Tremor of both hands 07/30/2020  . Left arm swelling 04/10/2020  . Aortic atherosclerosis ([place]) 03/30/2020  . Cellulitis of arm, left 03/30/2020  . Chronic thoracic back pain 02/03/2020  . Iron deficiency anemia 02/02/2020  . Near syncope 09/27/2019  . Hypertension 09/20/2019  . Acute pain of right knee 08/03/2019  . (HFpEF) heart failure with preserved ejection fraction ([place]) 08/02/2019  . Pacemaker 07/19/2019  . CKD (chronic kidney disease) 01/30/2019  . Lumbar spinal stenosis 01/10/2018  . Osteoarthritis of both knees 12/15/2017  . Epigastric pain 09/09/2017  . Neuralgia 09/09/2017  . Claudication of calf muscles ([place]) 11/22/2016  . DOE (dyspnea on exertion) 11/22/2016  . Hyperreflexia of lower extremity 07/16/2016  . Bilateral leg paresthesia 07/16/2016  . Carotid arterial disease ([place]) 07/13/2016  . Poor balance 06/23/2016  . Weakness of both lower extremities 06/23/2016  . Sinus node dysfunction ([place]) 05/05/2016  . Right shoulder pain 03/14/2016  . Multinodular goiter (nontoxic) 02/21/2016  . Neck pain 02/15/2016  . Prediabetes 12/17/2015  . Dysphagia 12/17/2015  . Anxiety 11/13/2015  . T8 vertebral fracture ([place]) 04/09/2015  . Overweight 10/09/2011  . Allergic rhinitis, cause unspecified   . GERD (gastroesophageal reflux disease) 02/05/2011  . Vitamin B 12 deficiency 04/17/2009  . Hyperlipidemia 04/17/2009  . Osteoporosis 04/17/2009  . ADENOCARCINOMA, BREAST, HX OF 04/17/2009  . Constipation 03/08/2009  . COLONIC POLYPS, HYPERPLASTIC,  HX OF 03/06/2009    [facility] ,[place], CCC-SLP  10/30/2020, 12:53 PM  [facility] [address] [place], [place], [address] Phone: [phone]   Fax:  [phone]   Name: [name] MRN: [id number] Date of Birth: [birth year]

## 2020-10-30 NOTE — Therapy (Signed)
[facility] [address] [place], [place], [address] Phone: [phone]   Fax:  [phone]  Physical Therapy Treatment  Patient Details  Name: [name] MRN: [id number] Date of Birth: [birth year] Referring Provider (PT): Tat, Wells Guiles   Encounter Date: 10/30/2020   PT End of Session - 10/30/20 1113    Visit Number 8    Number of Visits 13    Date for PT Re-Evaluation 37/62/83   recert for 90 days; 8 wk POC (recert completed 1/51/76)   Authorization Type Medicare/AARP    PT Start Time 1106    PT Stop Time 1145    PT Time Calculation (min) 39 min    Equipment Utilized During Treatment Gait belt    Activity Tolerance Patient tolerated treatment well    Behavior During Therapy Flat affect;WFL for tasks assessed/performed           Past Medical History:  Diagnosis Date  . Allergy   . Anemia   . Cancer of left breast ([place]) 1978  . CHF (congestive heart failure) ([place])   . Chronic thoracic back pain    "T8; fracture; 03/2015; no OR" (05/05/2016)  . GERD (gastroesophageal reflux disease)   . Heart murmur   . History of hiatal hernia   . Hyperlipidemia   . Hyperplastic colon polyp   . Hypertension   . IBS (irritable bowel syndrome)   . Multiple thyroid nodules   . Osteoporosis    T8 compression fx 03/2015   . Personal history of radiation therapy   . Presence of permanent cardiac pacemaker   . Vitamin B12 deficiency     Past Surgical History:  Procedure Laterality Date  . ANTERIOR CERVICAL DECOMP/DISCECTOMY FUSION  2008   C5-6  . AUGMENTATION MAMMAPLASTY    . BACK SURGERY    . BREAST SURGERY    . CARDIAC CATHETERIZATION  2001  . EP IMPLANTABLE DEVICE N/A 05/05/2016   Procedure: Pacemaker Implant;  Surgeon: Evans Lance, MD;  Location: [facility];  Service: Cardiovascular;  Laterality: N/A;  . EXCISIONAL HEMORRHOIDECTOMY  1984   With subsequent correction of surgery  . INSERT / REPLACE /  REMOVE PACEMAKER    . INTRAVASCULAR PRESSURE WIRE/FFR STUDY N/A 10/07/2019   Procedure: INTRAVASCULAR PRESSURE WIRE/FFR STUDY;  Surgeon: Jettie Booze, MD;  Location: [facility];  Service: Cardiovascular;  Laterality: N/A;  . KNEE ARTHROSCOPY Left    "meniscus tear"  . LEFT HEART CATH AND CORONARY ANGIOGRAPHY N/A 10/07/2019   Procedure: LEFT HEART CATH AND CORONARY ANGIOGRAPHY;  Surgeon: Jettie Booze, MD;  Location: [facility];  Service: Cardiovascular;  Laterality: N/A;  . MASTECTOMY Left 1978  . PLACEMENT OF BREAST IMPLANTS Left 1981  . REDUCTION MAMMAPLASTY Right   . SHOULDER ARTHROSCOPY W/ ROTATOR CUFF REPAIR Left 02/2006   "tear"  . TUBAL LIGATION      Vitals:   10/30/20 1117  BP: (!) 150/50  Pulse: 61     Subjective Assessment - 10/30/20 1110    Subjective The weekend was really rough.  The shaking starting really rough when I tried to go to Sunday School.  Went to [employer] with my husband and walked all over the store with my rollator no shaking; today I started the shaking more.    Patient is accompained by: Family member   Husband   Pertinent History PMH includes CHF, pacemaker placement 2017, cataract surgery, compression fracture, chronic back pain.  Patient Stated Goals Pt's goals for therapy are to -Unsure-this is just happening so fast.  Pt agrees to working to lessen fall risk and improve safe mobility.    Currently in Pain? No/denies                             [facility] Adult PT Treatment/Exercise - 10/30/20 0001      Transfers   Transfers Sit to Stand;Stand to Sit;Stand Pivot Transfers    Sit to Stand 4: Min guard;With upper extremity assist;From bed    Sit to Stand Details Verbal cues for sequencing;Verbal cues for technique    Stand to Sit 4: Min assist;With upper extremity assist;Uncontrolled descent;To bed    Stand to Sit Details (indicate cue type and reason) Tactile cues for initiation;Tactile cues for  sequencing;Verbal cues for sequencing;Verbal cues for technique    Stand to Sit Details Cues and assist for slowed descent    Stand Pivot Transfers 4: Min guard      Ambulation/Gait   Ambulation/Gait Yes    Ambulation/Gait Assistance 4: Min guard    Ambulation/Gait Assistance Details Used UpWalker    Ambulation Distance (Feet) 115 Feet   then 150, then 80   Assistive device 4-wheeled walker;Other (Comment)   UpWalker   Gait Pattern Step-through pattern;Decreased step length - right;Decreased step length - left;Festinating;Trunk flexed;Narrow base of support   BLE tremors when starting to sit   Ambulation Surface Level;Indoor    Pre-Gait Activities Standing at [employer], marching in place, x 10 reps.  Initial BLE tremors, but pt able to relax, stop and start marching again.  More unsteady with stance on RLE.      Exercises   Exercises Other Exercises    Other Exercises  Seated leg exercises at edge of mat:   LAQ 2 sets x 10 reps.  Additional 3rd set with 2# weights.  Seated heel/toe raises 20 reps.  Seated side step out and in, stepping over boomwhacker 10 reps, 2 sets.  Seated march, 2 sets 10 reps with 2# weight                    PT Short Term Goals - 10/18/20 1204      PT SHORT TERM GOAL #1   Title Pt will perform HEP with husband's supervision, for lower extremity strength and transfer safety.  TARGET 11/16/20    Time 4    Period Weeks    Status New      PT SHORT TERM GOAL #2   Title Pt will perform standing at counter/sink for 3 minutes with min guard for improved ADL participation.    Baseline 1:30 sec 10/18/20    Time 4    Period Weeks    Status New             PT Long Term Goals - 10/18/20 1205      PT LONG TERM GOAL #1   Title Pt/husband will demo correct transfer technique for sit<>stand from chair to [employer].  TARGET 12/14/20    Baseline needs min guard and reminder cues on proper technique to stand min A for safety due to uncontrolled descent and pt  reporting her head feels heavy in standing.    Time 8    Period Weeks    Status On-going      PT LONG TERM GOAL #2   Title Pt will stand at least 4 minutes with UE support  with no episode of retropulsion, for improved standing to participate in ADLs.    Baseline retropulsion upon standing; 3/24:  2 standing bouts of 1:30 each, no retropulsion    Time 8    Period Weeks    Status Revised      PT LONG TERM GOAL #3   Title Pt will ambulate 50-100 ft using UpWalker, with min guard assist, for improved gait efficiency and tolerance in the home.    Baseline 10/18/20:  1.47 ft/sec    Time 48    Period Weeks    Status New                 Plan - 10/30/20 1823    Clinical Impression Statement Continued increase in bouts of gait today.  She has more episodes of lower body/whole body tremors, and needs assist to safety sit when these come on.  These continue to be more unpredictable, but appear to happen more when she is fatigued.  She will continue to benefit from further skilled PT to address balance, functional strengthening and overall improved safety with functional mobility.    Personal Factors and Comorbidities Comorbidity 3+    Comorbidities See full problem list, but of note:  CHF, pacemaker placement, chronic back pain, hx of compression fractures    Examination-Activity Limitations Locomotion Level;Transfers;Hygiene/Grooming;Toileting;Dressing;Stand    Examination-Participation Restrictions Meal Prep;Community Activity    Stability/Clinical Decision Making Unstable/Unpredictable    Rehab Potential Fair    PT Frequency 1x / week    PT Duration 8 weeks   per recert 11/18/5359   PT Treatment/Interventions ADLs/Self Care Home Management;DME Instruction;Neuromuscular re-education;Balance training;Therapeutic exercise;Therapeutic activities;Functional mobility training;Gait training;Patient/family education;Wheelchair mobility training    PT Next Visit Plan Continue to progress sets of  seated ex and standing tolerance, gait with UPWalker; transfer safety with pt/husband education    Consulted and Agree with Plan of Care Patient;Family member/caregiver    Family Member Consulted Husband           Patient will benefit from skilled therapeutic intervention in order to improve the following deficits and impairments:  Abnormal gait,Difficulty walking,Decreased safety awareness,Decreased activity tolerance,Decreased balance,Decreased mobility,Decreased strength,Postural dysfunction  Visit Diagnosis: Unsteadiness on feet  Muscle weakness (generalized)  Other abnormalities of gait and mobility     Problem List Patient Active Problem List   Diagnosis Date Noted  . IBS (irritable bowel syndrome) 10/28/2020  . Tremor of both hands 07/30/2020  . Left arm swelling 04/10/2020  . Aortic atherosclerosis ([place]) 03/30/2020  . Cellulitis of arm, left 03/30/2020  . Chronic thoracic back pain 02/03/2020  . Iron deficiency anemia 02/02/2020  . Near syncope 09/27/2019  . Hypertension 09/20/2019  . Acute pain of right knee 08/03/2019  . (HFpEF) heart failure with preserved ejection fraction ([place]) 08/02/2019  . Pacemaker 07/19/2019  . CKD (chronic kidney disease) 01/30/2019  . Lumbar spinal stenosis 01/10/2018  . Osteoarthritis of both knees 12/15/2017  . Epigastric pain 09/09/2017  . Neuralgia 09/09/2017  . Claudication of calf muscles ([place]) 11/22/2016  . DOE (dyspnea on exertion) 11/22/2016  . Hyperreflexia of lower extremity 07/16/2016  . Bilateral leg paresthesia 07/16/2016  . Carotid arterial disease ([place]) 07/13/2016  . Poor balance 06/23/2016  . Weakness of both lower extremities 06/23/2016  . Sinus node dysfunction ([place]) 05/05/2016  . Right shoulder pain 03/14/2016  . Multinodular goiter (nontoxic) 02/21/2016  . Neck pain 02/15/2016  . Prediabetes 12/17/2015  . Dysphagia 12/17/2015  . Anxiety 11/13/2015  . T8 vertebral fracture (  [place]) 04/09/2015  . Overweight  10/09/2011  . Allergic rhinitis, cause unspecified   . GERD (gastroesophageal reflux disease) 02/05/2011  . Vitamin B 12 deficiency 04/17/2009  . Hyperlipidemia 04/17/2009  . Osteoporosis 04/17/2009  . ADENOCARCINOMA, BREAST, HX OF 04/17/2009  . Constipation 03/08/2009  . COLONIC POLYPS, HYPERPLASTIC, HX OF 03/06/2009    Lilianne Delair W. 10/30/2020, 6:28 PM Frazier Butt., PT  [facility] [address] [place], [place], [address] Phone: [phone]   Fax:  [phone]  Name: PASHA GADISON MRN: [id number] Date of Birth: 24-[name]-1935

## 2020-10-30 NOTE — Patient Instructions (Signed)
   Continue to do PT and we will re-evaluate ST after your discharge from PT. We will need a new prescription.

## 2020-11-01 ENCOUNTER — Ambulatory Visit: Payer: Medicare Other

## 2020-11-01 ENCOUNTER — Encounter: Payer: Self-pay | Admitting: Physical Therapy

## 2020-11-01 ENCOUNTER — Other Ambulatory Visit: Payer: Self-pay

## 2020-11-01 ENCOUNTER — Ambulatory Visit: Payer: Medicare Other | Admitting: Physical Therapy

## 2020-11-01 VITALS — BP 150/76

## 2020-11-01 DIAGNOSIS — M6281 Muscle weakness (generalized): Secondary | ICD-10-CM

## 2020-11-01 DIAGNOSIS — R2689 Other abnormalities of gait and mobility: Secondary | ICD-10-CM | POA: Diagnosis not present

## 2020-11-01 DIAGNOSIS — R2681 Unsteadiness on feet: Secondary | ICD-10-CM | POA: Diagnosis not present

## 2020-11-01 DIAGNOSIS — R471 Dysarthria and anarthria: Secondary | ICD-10-CM | POA: Diagnosis not present

## 2020-11-01 DIAGNOSIS — R29818 Other symptoms and signs involving the nervous system: Secondary | ICD-10-CM | POA: Diagnosis not present

## 2020-11-01 DIAGNOSIS — R1310 Dysphagia, unspecified: Secondary | ICD-10-CM | POA: Diagnosis not present

## 2020-11-02 NOTE — Therapy (Signed)
[facility] [address] [place], [place], [address] Phone: [phone]   Fax:  [phone]  Physical Therapy Treatment  Patient Details  Name: [name] MRN: [id number] Date of Birth: 1935-06-29 Referring Provider (PT): Tat, Wells Guiles   Encounter Date: 11/01/2020   PT End of Session - 11/01/20 1104    Visit Number 9    Number of Visits 13    Date for PT Re-Evaluation 58/85/02   recert for 90 days; 8 wk POC (recert completed 7/74/12)   Authorization Type Medicare/AARP    PT Start Time 1102    PT Stop Time 1145    PT Time Calculation (min) 43 min    Equipment Utilized During Treatment Gait belt    Activity Tolerance Patient tolerated treatment well    Behavior During Therapy Flat affect;WFL for tasks assessed/performed           Past Medical History:  Diagnosis Date  . Allergy   . Anemia   . Cancer of left breast ([facility]) 1978  . CHF (congestive heart failure) ([facility])   . Chronic thoracic back pain    "T8; fracture; 03/2015; no OR" (05/05/2016)  . GERD (gastroesophageal reflux disease)   . Heart murmur   . History of hiatal hernia   . Hyperlipidemia   . Hyperplastic colon polyp   . Hypertension   . IBS (irritable bowel syndrome)   . Multiple thyroid nodules   . Osteoporosis    T8 compression fx 03/2015   . Personal history of radiation therapy   . Presence of permanent cardiac pacemaker   . Vitamin B12 deficiency     Past Surgical History:  Procedure Laterality Date  . ANTERIOR CERVICAL DECOMP/DISCECTOMY FUSION  2008   C5-6  . AUGMENTATION MAMMAPLASTY    . BACK SURGERY    . BREAST SURGERY    . CARDIAC CATHETERIZATION  2001  . EP IMPLANTABLE DEVICE N/A 05/05/2016   Procedure: Pacemaker Implant;  Surgeon: Evans Lance, MD;  Location: [facility];  Service: Cardiovascular;  Laterality: N/A;  . EXCISIONAL HEMORRHOIDECTOMY  1984   With subsequent correction of surgery  . INSERT / REPLACE /  REMOVE PACEMAKER    . INTRAVASCULAR PRESSURE WIRE/FFR STUDY N/A 10/07/2019   Procedure: INTRAVASCULAR PRESSURE WIRE/FFR STUDY;  Surgeon: Jettie Booze, MD;  Location: [facility];  Service: Cardiovascular;  Laterality: N/A;  . KNEE ARTHROSCOPY Left    "meniscus tear"  . LEFT HEART CATH AND CORONARY ANGIOGRAPHY N/A 10/07/2019   Procedure: LEFT HEART CATH AND CORONARY ANGIOGRAPHY;  Surgeon: Jettie Booze, MD;  Location: [facility];  Service: Cardiovascular;  Laterality: N/A;  . MASTECTOMY Left 1978  . PLACEMENT OF BREAST IMPLANTS Left 1981  . REDUCTION MAMMAPLASTY Right   . SHOULDER ARTHROSCOPY W/ ROTATOR CUFF REPAIR Left 02/2006   "tear"  . TUBAL LIGATION      Vitals:   11/01/20 1105  BP: (!) 150/76     Subjective Assessment - 11/01/20 1104    Subjective No new complaints. The shaking is okay today. Reports low BP's yesterday, however today it was okay this morning.    Patient is accompained by: Family member   spouse   Pertinent History PMH includes CHF, pacemaker placement 2017, cataract surgery, compression fracture, chronic back pain.    Patient Stated Goals Pt's goals for therapy are to -Unsure-this is just happening so fast.  Pt agrees to working to lessen fall risk and  improve safe mobility.    Currently in Pain? No/denies                [facility] Adult PT Treatment/Exercise - 11/01/20 1111      Transfers   Transfers Sit to Stand;Stand to [employer] Transfers    Sit to Stand 4: Min guard;With upper extremity assist;From bed    Sit to Stand Details Verbal cues for sequencing;Verbal cues for technique    Stand to Sit 4: Min guard;With upper extremity assist;To bed;To chair/3-in-1    Stand to Sit Details (indicate cue type and reason) Verbal cues for technique;Verbal cues for sequencing    Stand Pivot Transfers 4: Min guard    Stand Pivot Transfer Details (indicate cue type and reason) with HHA from PTA from transport chair to mat table     Comments no tremors noted with stand pivot transfer to mat table. tremors present with standing from mat to Upwalker and then with sitting to transport chair from [employer].      Ambulation/Gait   Ambulation/Gait Yes    Ambulation/Gait Assistance 4: Min guard;4: Min assist;3: Mod assist    Ambulation/Gait Assistance Details right>left knee buckling through out gait rep. Pt states this has started in the past few days. Quick in motion buckles with little to no time for pt to be able to self correct therefore needed up to mod assist at times for balance    Ambulation Distance (Feet) 230 Feet   x1   Assistive device Other (Comment)   UpWalker   Gait Pattern Step-through pattern;Decreased step length - right;Decreased step length - left;Festinating;Trunk flexed;Narrow base of support   bil LE tremors   Ambulation Surface Level;Indoor      Exercises   Exercises Other Exercises    Other Exercises  seated at edge of mat: with 2# ankle weights- long arc quads and alternating marching for 3 sets of 10 reps each; with yellow band resistance- hamstring curls for 2 sets of 10 reps each leg; lateral stepping out/in over black beam for 2 sets of 10 reps on each side.                 PT Short Term Goals - 10/18/20 1204      PT SHORT TERM GOAL #1   Title Pt will perform HEP with husband's supervision, for lower extremity strength and transfer safety.  TARGET 11/16/20    Time 4    Period Weeks    Status New      PT SHORT TERM GOAL #2   Title Pt will perform standing at counter/sink for 3 minutes with min guard for improved ADL participation.    Baseline 1:30 sec 10/18/20    Time 4    Period Weeks    Status New             PT Long Term Goals - 10/18/20 1205      PT LONG TERM GOAL #1   Title Pt/husband will demo correct transfer technique for sit<>stand from chair to [employer].  TARGET 12/14/20    Baseline needs min guard and reminder cues on proper technique to stand min A for safety due to  uncontrolled descent and pt reporting her head feels heavy in standing.    Time 8    Period Weeks    Status On-going      PT LONG TERM GOAL #2   Title Pt will stand at least 4 minutes with UE support with no episode of  retropulsion, for improved standing to participate in ADLs.    Baseline retropulsion upon standing; 3/24:  2 standing bouts of 1:30 each, no retropulsion    Time 8    Period Weeks    Status Revised      PT LONG TERM GOAL #3   Title Pt will ambulate 50-100 ft using UpWalker, with min guard assist, for improved gait efficiency and tolerance in the home.    Baseline 10/18/20:  1.47 ft/sec    Time 48    Period Weeks    Status New                 Plan - 11/01/20 1105    Clinical Impression Statement Today's skilled session continued to focus on LE strengthening with focus on increased reps with no issues reported or noted in session. Remainder of session continued to focus on gait with Upwalker with bil knees buckling randomly with gait (right>left) with up to mod assist to correct balance. Pt reports the buckling has started in the last few days. Will continue to monitor. The pt should benefit from continued PT to progress toward unmet goals.    Personal Factors and Comorbidities Comorbidity 3+    Comorbidities See full problem list, but of note:  CHF, pacemaker placement, chronic back pain, hx of compression fractures    Examination-Activity Limitations Locomotion Level;Transfers;Hygiene/Grooming;Toileting;Dressing;Stand    Examination-Participation Restrictions Meal Prep;Community Activity    Stability/Clinical Decision Making Unstable/Unpredictable    Rehab Potential Fair    PT Frequency 1x / week    PT Duration 8 weeks   per recert 4/65/6812   PT Treatment/Interventions ADLs/Self Care Home Management;DME Instruction;Neuromuscular re-education;Balance training;Therapeutic exercise;Therapeutic activities;Functional mobility training;Gait training;Patient/family  education;Wheelchair mobility training    PT Next Visit Plan Continue to progress sets of seated ex and standing tolerance, gait with UPWalker; transfer safety with pt/husband education    Consulted and Agree with Plan of Care Patient;Family member/caregiver    Family Member Consulted Husband           Patient will benefit from skilled therapeutic intervention in order to improve the following deficits and impairments:  Abnormal gait,Difficulty walking,Decreased safety awareness,Decreased activity tolerance,Decreased balance,Decreased mobility,Decreased strength,Postural dysfunction  Visit Diagnosis: Unsteadiness on feet  Muscle weakness (generalized)  Other abnormalities of gait and mobility     Problem List Patient Active Problem List   Diagnosis Date Noted  . IBS (irritable bowel syndrome) 10/28/2020  . Tremor of both hands 07/30/2020  . Left arm swelling 04/10/2020  . Aortic atherosclerosis ([facility]) 03/30/2020  . Cellulitis of arm, left 03/30/2020  . Chronic thoracic back pain 02/03/2020  . Iron deficiency anemia 02/02/2020  . Near syncope 09/27/2019  . Hypertension 09/20/2019  . Acute pain of right knee 08/03/2019  . (HFpEF) heart failure with preserved ejection fraction ([place]) 08/02/2019  . Pacemaker 07/19/2019  . CKD (chronic kidney disease) 01/30/2019  . Lumbar spinal stenosis 01/10/2018  . Osteoarthritis of both knees 12/15/2017  . Epigastric pain 09/09/2017  . Neuralgia 09/09/2017  . Claudication of calf muscles ([facility]) 11/22/2016  . DOE (dyspnea on exertion) 11/22/2016  . Hyperreflexia of lower extremity 07/16/2016  . Bilateral leg paresthesia 07/16/2016  . Carotid arterial disease ([facility]) 07/13/2016  . Poor balance 06/23/2016  . Weakness of both lower extremities 06/23/2016  . Sinus node dysfunction ([facility]) 05/05/2016  . Right shoulder pain 03/14/2016  . Multinodular goiter (nontoxic) 02/21/2016  . Neck pain 02/15/2016  . Prediabetes 12/17/2015  . Dysphagia  12/17/2015  .  Anxiety 11/13/2015  . T8 vertebral fracture ([facility]) 04/09/2015  . Overweight 10/09/2011  . Allergic rhinitis, cause unspecified   . GERD (gastroesophageal reflux disease) 02/05/2011  . Vitamin B 12 deficiency 04/17/2009  . Hyperlipidemia 04/17/2009  . Osteoporosis 04/17/2009  . ADENOCARCINOMA, BREAST, HX OF 04/17/2009  . Constipation 03/08/2009  . COLONIC POLYPS, HYPERPLASTIC, HX OF 03/06/2009    Willow Ora, PTA, [facility] [address] [place], [place] [address] [phone] 11/02/20, 4:33 PM   Name: [name] MRN: [id number] Date of Birth: [birth year]

## 2020-11-05 ENCOUNTER — Telehealth: Payer: Self-pay

## 2020-11-05 ENCOUNTER — Ambulatory Visit (INDEPENDENT_AMBULATORY_CARE_PROVIDER_SITE_OTHER)
Admission: RE | Admit: 2020-11-05 | Discharge: 2020-11-05 | Disposition: A | Discharge status: Home / Self Care | Payer: Medicare Other | Source: Ambulatory Visit | Attending: Internal Medicine | Admitting: Internal Medicine

## 2020-11-05 ENCOUNTER — Other Ambulatory Visit: Payer: Self-pay

## 2020-11-05 DIAGNOSIS — M810 Age-related osteoporosis without current pathological fracture: Secondary | ICD-10-CM | POA: Diagnosis not present

## 2020-11-05 NOTE — Progress Notes (Addendum)
    Chronic Care Management Pharmacy Assistant   Name: [name]  MRN: [id number] DOB: [birth year]  Reason for Encounter: Medication Review    Medications: Outpatient Encounter Medications as of 11/05/2020  Medication Sig   acetaminophen (TYLENOL) 500 MG tablet Take 500 mg by mouth 2 (two) times daily.   carbidopa-levodopa (SINEMET IR) 25-100 MG tablet Take 1 tablet by mouth 3 (three) times daily. 7am/11am/4pm   cholecalciferol (VITAMIN D) 1000 UNITS tablet Take 2,000 Units by mouth daily.    Cyanocobalamin 1000 MCG TBCR Take 1,000 mcg by mouth daily.    denosumab (PROLIA) 60 MG/ML SOSY injection Inject 60 mg into the skin every 6 (six) months.   Diclofenac Sodium (PENNSAID) 2 % SOLN Apply topically.   diclofenac Sodium (VOLTAREN) 1 % GEL Apply 2 g topically 4 (four) times daily.   dicyclomine (BENTYL) 10 MG capsule Take 1 capsule (10 mg total) by mouth 3 (three) times daily before meals. (Patient not taking: Reported on 10/29/2020)   escitalopram (LEXAPRO) 5 MG tablet Take 1 tablet (5 mg total) by mouth daily.   ezetimibe (ZETIA) 10 MG tablet Take 1 tablet (10 mg total) by mouth daily.   ferrous sulfate 325 (65 FE) MG tablet Take 325 mg by mouth 3 (three) times a week.   furosemide (LASIX) 40 MG tablet Take one tablet in the AM and 1/2 tablet after lunch.   loratadine (CLARITIN) 10 MG tablet Take 10 mg by mouth daily as needed for allergies.   metoprolol succinate (TOPROL XL) 25 MG 24 hr tablet Take 1 tablet (25 mg total) by mouth daily. (Patient taking differently: Take 12.5 mg by mouth daily.)   omeprazole (PRILOSEC) 40 MG capsule TAKE ONE CAPSULE BY MOUTH TWICE A DAY   Polyethyl Glycol-Propyl Glycol (LUBRICANT EYE DROPS) 0.4-0.3 % SOLN Place 1 drop into both eyes 3 (three) times daily as needed (dry/irritated eyes.).   polyethylene glycol (MIRALAX / GLYCOLAX) packet Take 17 g by mouth daily as needed (constipation).   potassium chloride (KLOR-CON) 10 MEQ tablet Take 2 tablets (20  mEq total) by mouth daily.   sodium chloride (OCEAN) 0.65 % SOLN nasal spray Place 1 spray into both nostrils 4 (four) times daily as needed for congestion.   No facility-administered encounter medications on file as of 11/05/2020.    BP Readings from Last 3 Encounters:  11/01/20 (!) 150/76  10/30/20 (!) 150/50  10/29/20 126/80    Lab Results  Component Value Date   HGBA1C 5.9 (H) 02/03/2020     Patient obtains medications through Vials  90 Days   Last adherence delivery included: (medication name and frequency): Omeprazole 40 mg cap Take one capsule by mouth every morning and take one capsule by mouth every evening Escitalopram 5 mg tab Take one tab by mouth once daily Metoprolol Succinate 25 mg Take 1/2 by mouth everyday at bedtime   Patient is due for next adherence delivery on: 11/22/20. Called patient and reviewed medications and coordinated delivery.  This delivery to include: Carbidopa-levodopa 25-100 mg tab   Patient declined the following medications (meds) due to (reason): Potassium Chloride Er- Patient states she no longer takes this medication.  Confirmed delivery date of 11/22/20, advised patient that pharmacy will contact them the morning of delivery.  Orinda Kenner, [facility] Clinical Pharmacists Assistant [phone]  Time Spent: [phone]

## 2020-11-06 ENCOUNTER — Ambulatory Visit: Payer: Medicare Other

## 2020-11-06 ENCOUNTER — Ambulatory Visit: Payer: Medicare Other | Admitting: Physical Therapy

## 2020-11-08 ENCOUNTER — Ambulatory Visit: Payer: Medicare Other

## 2020-11-08 ENCOUNTER — Ambulatory Visit: Payer: Medicare Other | Admitting: Physical Therapy

## 2020-11-19 ENCOUNTER — Encounter: Payer: Self-pay | Admitting: Internal Medicine

## 2020-11-20 ENCOUNTER — Ambulatory Visit: Payer: Medicare Other | Admitting: Physical Therapy

## 2020-11-20 ENCOUNTER — Other Ambulatory Visit: Payer: Self-pay

## 2020-11-20 ENCOUNTER — Encounter: Payer: Self-pay | Admitting: Physical Therapy

## 2020-11-20 ENCOUNTER — Ambulatory Visit: Payer: Medicare Other

## 2020-11-20 VITALS — BP 156/58 | HR 60

## 2020-11-20 DIAGNOSIS — R1310 Dysphagia, unspecified: Secondary | ICD-10-CM | POA: Diagnosis not present

## 2020-11-20 DIAGNOSIS — R2689 Other abnormalities of gait and mobility: Secondary | ICD-10-CM | POA: Diagnosis not present

## 2020-11-20 DIAGNOSIS — R2681 Unsteadiness on feet: Secondary | ICD-10-CM | POA: Diagnosis not present

## 2020-11-20 DIAGNOSIS — R471 Dysarthria and anarthria: Secondary | ICD-10-CM | POA: Diagnosis not present

## 2020-11-20 DIAGNOSIS — M6281 Muscle weakness (generalized): Secondary | ICD-10-CM | POA: Diagnosis not present

## 2020-11-20 DIAGNOSIS — R29818 Other symptoms and signs involving the nervous system: Secondary | ICD-10-CM | POA: Diagnosis not present

## 2020-11-20 NOTE — Therapy (Signed)
[facility] [address] [place], [place], [address] Phone: [phone]   Fax:  [phone]  Physical Therapy Treatment/10th Visit Progress Note   Patient Details  Name: [name] MRN: [id number] Date of Birth: 04-06-1935 Referring Provider (PT): Tat, Wells Guiles   Encounter Date: 11/20/2020   PT End of Session - 11/20/20 1203    Visit Number 10    Number of Visits 13    Date for PT Re-Evaluation 15/17/61   recert for 90 days; 8 wk POC (recert completed 01/02/36)   Authorization Type Medicare/AARP    PT Start Time 1102    PT Stop Time 1145    PT Time Calculation (min) 43 min    Equipment Utilized During Treatment Gait belt    Activity Tolerance Patient tolerated treatment well    Behavior During Therapy Flat affect;WFL for tasks assessed/performed           Past Medical History:  Diagnosis Date  . Allergy   . Anemia   . Cancer of left breast ([place]) 1978  . CHF (congestive heart failure) ([place])   . Chronic thoracic back pain    "T8; fracture; 03/2015; no OR" (05/05/2016)  . GERD (gastroesophageal reflux disease)   . Heart murmur   . History of hiatal hernia   . Hyperlipidemia   . Hyperplastic colon polyp   . Hypertension   . IBS (irritable bowel syndrome)   . Multiple thyroid nodules   . Osteoporosis    T8 compression fx 03/2015   . Personal history of radiation therapy   . Presence of permanent cardiac pacemaker   . Vitamin B12 deficiency     Past Surgical History:  Procedure Laterality Date  . ANTERIOR CERVICAL DECOMP/DISCECTOMY FUSION  2008   C5-6  . AUGMENTATION MAMMAPLASTY    . BACK SURGERY    . BREAST SURGERY    . CARDIAC CATHETERIZATION  2001  . EP IMPLANTABLE DEVICE N/A 05/05/2016   Procedure: Pacemaker Implant;  Surgeon: Evans Lance, MD;  Location: [facility];  Service: Cardiovascular;  Laterality: N/A;  . EXCISIONAL HEMORRHOIDECTOMY  1984   With subsequent correction of  surgery  . INSERT / REPLACE / REMOVE PACEMAKER    . INTRAVASCULAR PRESSURE WIRE/FFR STUDY N/A 10/07/2019   Procedure: INTRAVASCULAR PRESSURE WIRE/FFR STUDY;  Surgeon: Jettie Booze, MD;  Location: [facility];  Service: Cardiovascular;  Laterality: N/A;  . KNEE ARTHROSCOPY Left    "meniscus tear"  . LEFT HEART CATH AND CORONARY ANGIOGRAPHY N/A 10/07/2019   Procedure: LEFT HEART CATH AND CORONARY ANGIOGRAPHY;  Surgeon: Jettie Booze, MD;  Location: [facility];  Service: Cardiovascular;  Laterality: N/A;  . MASTECTOMY Left 1978  . PLACEMENT OF BREAST IMPLANTS Left 1981  . REDUCTION MAMMAPLASTY Right   . SHOULDER ARTHROSCOPY W/ ROTATOR CUFF REPAIR Left 02/2006   "tear"  . TUBAL LIGATION      Vitals:   11/20/20 1111  BP: (!) 156/58  Pulse: 60     Subjective Assessment - 11/20/20 1104    Subjective Still having the issue with knees giving way.  Some days are worse than others.  Sunday night in the bed, had an episode with L leg shook and had shooting/shocking pain from lower leg to knee.  Took it a while, but it went away.  No falls.  In the apartment, limited with using the walker, so if I'm having a good day, I'll hold onto husband  and he won't let me fall.  Have hired a caregiver for several days a week.    Patient is accompained by: Family member   spouse   Pertinent History PMH includes CHF, pacemaker placement 2017, cataract surgery, compression fracture, chronic back pain.    Patient Stated Goals Pt's goals for therapy are to -Unsure-this is just happening so fast.  Pt agrees to working to lessen fall risk and improve safe mobility.    Currently in Pain? No/denies                             [facility] Adult PT Treatment/Exercise - 11/20/20 0001      Transfers   Transfers Sit to Stand;Stand to Sit;Stand Pivot Transfers    Sit to Stand 4: Min guard;With upper extremity assist;From chair/3-in-1   From w/c   Sit to Stand Details Verbal cues  for sequencing;Verbal cues for technique    Sit to Stand Details (indicate cue type and reason) Upon standing, pt has episodes of lower extremities near-buckling.  Cues provided for quad sets for upright posture to get out of knee buckling with BUE support.    Stand to Sit 4: Min guard;With upper extremity assist;To chair/3-in-1    Stand to Sit Details (indicate cue type and reason) Verbal cues for technique;Verbal cues for sequencing    Stand Pivot Transfers 4: Min guard    Stand Pivot Transfer Details (indicate cue type and reason) w/c<>mat table    Comments tremors noted with both sit>stand and stand pivot transfer.  Pt able to resolve them and complete transfer with therapist min guard assistance.      Therapeutic Activites    Therapeutic Activities Other Therapeutic Activities    Other Therapeutic Activities Stand at sink x 3:15 with conversation tasks, then x 1 minute with UE lifting, 1 UE at a time, 5 reps each, then side reach x 5 reps each UE.  3rd bout of standing with gentle minisquats>terminal knee extension x 6 reps with min guard assist.      Exercises   Exercises Knee/Hip    Other Exercises  Pt verbally reviews her HEP.  Seated ankle pumps x 1 minute at edge of mat upon return to sitting.      Knee/Hip Exercises: Seated   Long Arc Quad Strengthening;Right;Left;2 sets;10 reps;Weights    Long Arc Quad Weight 2 lbs.      Knee/Hip Exercises: Supine   Short Arc Quad Sets Strengthening;Right;Left;2 sets;10 reps    Straight Leg Raises Strengthening;Left;Right;1 set;10 reps    Other Supine Knee/Hip Exercises Hooklying marching x 10 reps, alternating legs.                  PT Education - 11/20/20 1202    Education Details Progress towards goals, likely additions to HEP next visit    Person(s) Educated Patient;Spouse    Methods Explanation    Comprehension Verbalized understanding            PT Short Term Goals - 11/20/20 1202      PT SHORT TERM GOAL #1   Title Pt  will perform HEP with husband's supervision, for lower extremity strength and transfer safety.  TARGET 11/16/20    Time 4    Period Weeks    Status Achieved      PT SHORT TERM GOAL #2   Title Pt will perform standing at counter/sink for 3 minutes with min guard for improved  ADL participation.    Baseline 1:30 sec 10/18/20; 3:15 11/20/20    Time 4    Period Weeks    Status Achieved             PT Long Term Goals - 10/18/20 1205      PT LONG TERM GOAL #1   Title Pt/husband will demo correct transfer technique for sit<>stand from chair to [employer].  TARGET 12/14/20    Baseline needs min guard and reminder cues on proper technique to stand min A for safety due to uncontrolled descent and pt reporting her head feels heavy in standing.    Time 8    Period Weeks    Status On-going      PT LONG TERM GOAL #2   Title Pt will stand at least 4 minutes with UE support with no episode of retropulsion, for improved standing to participate in ADLs.    Baseline retropulsion upon standing; 3/24:  2 standing bouts of 1:30 each, no retropulsion    Time 8    Period Weeks    Status Revised      PT LONG TERM GOAL #3   Title Pt will ambulate 50-100 ft using UpWalker, with min guard assist, for improved gait efficiency and tolerance in the home.    Baseline 10/18/20:  1.47 ft/sec    Time 48    Period Weeks    Status New                 Plan - 11/20/20 1203    Clinical Impression Statement 10th Visit Progress note, covering dates 09/10/2020-11/20/2020.  Pt is able to perform sit to stand and stand pivot transfers with min guard assist, with occasional knees buckling.  Pt stands x 3 minutes at sink wtih supervision.  Pt is able to ambulate 100-115 ft in gym using UPWalker, but gait and functional mobility fluctuates day to day and through the day with knee buckling/BLE tremor episodes which can result in pt needing mod assist or more to safely sit.  She has met 2 of 2 STGs assessed this visit and is  on target towards LTGs.  Pt will continue to benefit from skilled PT, as she is at fall risk and has hx of falls, though none recently. Plan to continue POC towards LTGs.    Personal Factors and Comorbidities Comorbidity 3+    Comorbidities See full problem list, but of note:  CHF, pacemaker placement, chronic back pain, hx of compression fractures    Examination-Activity Limitations Locomotion Level;Transfers;Hygiene/Grooming;Toileting;Dressing;Stand    Examination-Participation Restrictions Meal Prep;Community Activity    Stability/Clinical Decision Making Unstable/Unpredictable    Rehab Potential Fair    PT Frequency 1x / week    PT Duration 8 weeks   per recert 0/96/2836   PT Treatment/Interventions ADLs/Self Care Home Management;DME Instruction;Neuromuscular re-education;Balance training;Therapeutic exercise;Therapeutic activities;Functional mobility training;Gait training;Patient/family education;Wheelchair mobility training    PT Next Visit Plan Perform supine lower extremity exercises and add to HEP; standing tolerance with gentle minisquats/standing quad sets.  Gait with UpWalker, transfer safety with pt/husband education.  Work towards [employer].    Consulted and Agree with Plan of Care Patient;Family member/caregiver    Family Member Consulted Husband           Patient will benefit from skilled therapeutic intervention in order to improve the following deficits and impairments:  Abnormal gait,Difficulty walking,Decreased safety awareness,Decreased activity tolerance,Decreased balance,Decreased mobility,Decreased strength,Postural dysfunction  Visit Diagnosis: Muscle weakness (generalized)  Unsteadiness on feet  Problem List Patient Active Problem List   Diagnosis Date Noted  . IBS (irritable bowel syndrome) 10/28/2020  . Tremor of both hands 07/30/2020  . Left arm swelling 04/10/2020  . Aortic atherosclerosis ([place]) 03/30/2020  . Cellulitis of arm, left 03/30/2020  .  Chronic thoracic back pain 02/03/2020  . Iron deficiency anemia 02/02/2020  . Near syncope 09/27/2019  . Hypertension 09/20/2019  . Acute pain of right knee 08/03/2019  . (HFpEF) heart failure with preserved ejection fraction ([place]) 08/02/2019  . Pacemaker 07/19/2019  . CKD (chronic kidney disease) 01/30/2019  . Lumbar spinal stenosis 01/10/2018  . Osteoarthritis of both knees 12/15/2017  . Epigastric pain 09/09/2017  . Neuralgia 09/09/2017  . Claudication of calf muscles ([place]) 11/22/2016  . DOE (dyspnea on exertion) 11/22/2016  . Hyperreflexia of lower extremity 07/16/2016  . Bilateral leg paresthesia 07/16/2016  . Carotid arterial disease ([place]) 07/13/2016  . Poor balance 06/23/2016  . Weakness of both lower extremities 06/23/2016  . Sinus node dysfunction ([place]) 05/05/2016  . Right shoulder pain 03/14/2016  . Multinodular goiter (nontoxic) 02/21/2016  . Neck pain 02/15/2016  . Prediabetes 12/17/2015  . Dysphagia 12/17/2015  . Anxiety 11/13/2015  . T8 vertebral fracture ([place]) 04/09/2015  . Overweight 10/09/2011  . Allergic rhinitis, cause unspecified   . GERD (gastroesophageal reflux disease) 02/05/2011  . Vitamin B 12 deficiency 04/17/2009  . Hyperlipidemia 04/17/2009  . Osteoporosis 04/17/2009  . ADENOCARCINOMA, BREAST, HX OF 04/17/2009  . Constipation 03/08/2009  . COLONIC POLYPS, HYPERPLASTIC, HX OF 03/06/2009    Baylor Cortez W. 11/20/2020, 12:09 PM Frazier Butt., PT  [facility] [address] [place], [place], [address] Phone: [phone]   Fax:  [phone]  Name: [name] MRN: [id number] Date of Birth: [birth year]

## 2020-11-22 ENCOUNTER — Ambulatory Visit: Payer: Medicare Other

## 2020-11-22 ENCOUNTER — Encounter: Payer: Self-pay | Admitting: Physical Therapy

## 2020-11-22 ENCOUNTER — Ambulatory Visit: Payer: Medicare Other | Admitting: Physical Therapy

## 2020-11-22 ENCOUNTER — Other Ambulatory Visit: Payer: Self-pay

## 2020-11-22 VITALS — BP 128/49

## 2020-11-22 DIAGNOSIS — M6281 Muscle weakness (generalized): Secondary | ICD-10-CM | POA: Diagnosis not present

## 2020-11-22 DIAGNOSIS — R1310 Dysphagia, unspecified: Secondary | ICD-10-CM | POA: Diagnosis not present

## 2020-11-22 DIAGNOSIS — R2681 Unsteadiness on feet: Secondary | ICD-10-CM | POA: Diagnosis not present

## 2020-11-22 DIAGNOSIS — R471 Dysarthria and anarthria: Secondary | ICD-10-CM | POA: Diagnosis not present

## 2020-11-22 DIAGNOSIS — R2689 Other abnormalities of gait and mobility: Secondary | ICD-10-CM | POA: Diagnosis not present

## 2020-11-22 DIAGNOSIS — R29818 Other symptoms and signs involving the nervous system: Secondary | ICD-10-CM | POA: Diagnosis not present

## 2020-11-22 NOTE — Patient Instructions (Signed)
Access Code: [email or web address] URL: [email or web address] Date: 11/22/2020 Prepared by: Mady Haagensen  Exercises Seated March - 1-2 x daily - 5 x weekly - 1-2 sets - 10 reps Seated Long Arc Quad - 1-2 x daily - 5 x weekly - 1-2 sets - 10 reps Seated Ankle Dorsiflexion AROM - 1-2 x daily - 5 x weekly - 1-2 sets - 10 reps Seated Heel Raise - 1-2 x daily - 5 x weekly - 1-2 sets - 10 reps Supine Short Arc Quad - 1 x daily - 5 x weekly - 2 sets - 10 reps Small Range Straight Leg Raise - 1 x daily - 5 x weekly - 2 sets - 10 reps Supine March - 1 x daily - 5 x weekly - 2 sets - 10 reps

## 2020-11-23 NOTE — Therapy (Signed)
[facility] [address] [place], [place], [address] Phone: [phone]   Fax:  [phone]  Physical Therapy Treatment  Patient Details  Name: [name] MRN: [id number] Date of Birth: 12/15/1934 Referring Provider (PT): Tat, Wells Guiles   Encounter Date: 11/22/2020   PT End of Session - 11/22/20 1357    Visit Number 11    Number of Visits 13    Date for PT Re-Evaluation 18/84/16   recert for 90 days; 8 wk POC (recert completed 12/31/28)   Authorization Type Medicare/AARP    PT Start Time 1316    PT Stop Time 1358    PT Time Calculation (min) 42 min    Equipment Utilized During Treatment Gait belt    Activity Tolerance Patient tolerated treatment well    Behavior During Therapy Flat affect;WFL for tasks assessed/performed           Past Medical History:  Diagnosis Date  . Allergy   . Anemia   . Cancer of left breast ([place]) 1978  . CHF (congestive heart failure) ([place])   . Chronic thoracic back pain    "T8; fracture; 03/2015; no OR" (05/05/2016)  . GERD (gastroesophageal reflux disease)   . Heart murmur   . History of hiatal hernia   . Hyperlipidemia   . Hyperplastic colon polyp   . Hypertension   . IBS (irritable bowel syndrome)   . Multiple thyroid nodules   . Osteoporosis    T8 compression fx 03/2015   . Personal history of radiation therapy   . Presence of permanent cardiac pacemaker   . Vitamin B12 deficiency     Past Surgical History:  Procedure Laterality Date  . ANTERIOR CERVICAL DECOMP/DISCECTOMY FUSION  2008   C5-6  . AUGMENTATION MAMMAPLASTY    . BACK SURGERY    . BREAST SURGERY    . CARDIAC CATHETERIZATION  2001  . EP IMPLANTABLE DEVICE N/A 05/05/2016   Procedure: Pacemaker Implant;  Surgeon: Evans Lance, MD;  Location: [facility];  Service: Cardiovascular;  Laterality: N/A;  . EXCISIONAL HEMORRHOIDECTOMY  1984   With subsequent correction of surgery  . INSERT / REPLACE /  REMOVE PACEMAKER    . INTRAVASCULAR PRESSURE WIRE/FFR STUDY N/A 10/07/2019   Procedure: INTRAVASCULAR PRESSURE WIRE/FFR STUDY;  Surgeon: Jettie Booze, MD;  Location: [facility];  Service: Cardiovascular;  Laterality: N/A;  . KNEE ARTHROSCOPY Left    "meniscus tear"  . LEFT HEART CATH AND CORONARY ANGIOGRAPHY N/A 10/07/2019   Procedure: LEFT HEART CATH AND CORONARY ANGIOGRAPHY;  Surgeon: Jettie Booze, MD;  Location: [facility];  Service: Cardiovascular;  Laterality: N/A;  . MASTECTOMY Left 1978  . PLACEMENT OF BREAST IMPLANTS Left 1981  . REDUCTION MAMMAPLASTY Right   . SHOULDER ARTHROSCOPY W/ ROTATOR CUFF REPAIR Left 02/2006   "tear"  . TUBAL LIGATION      Vitals:   11/22/20 1342  BP: (!) 128/49     Subjective Assessment - 11/22/20 1316    Subjective Had a good day yesterday, a little shakiness today.    Patient is accompained by: Family member   spouse   Pertinent History PMH includes CHF, pacemaker placement 2017, cataract surgery, compression fracture, chronic back pain.    Patient Stated Goals Pt's goals for therapy are to -Unsure-this is just happening so fast.  Pt agrees to working to lessen fall risk and improve safe mobility.    Currently in Pain? No/denies  [facility] Adult PT Treatment/Exercise - 11/23/20 0956      Transfers   Transfers Sit to Stand;Stand to Sit;Stand Pivot Transfers    Sit to Stand 4: Min guard;With upper extremity assist;From chair/3-in-1    Stand to Sit 4: Min guard;With upper extremity assist;To chair/3-in-1    Stand Pivot Transfers 4: Min guard    Stand Pivot Transfer Details (indicate cue type and reason) w/c<>mat table.  BUE tremors with transfer back to w/c.  Cues to relax, breathe, weightbear through [place], but pt cotninues with intermittent BUE tremor.      Ambulation/Gait   Ambulation/Gait Yes    Ambulation/Gait Assistance 4: Min guard    Ambulation Distance (Feet) 115  Feet    Assistive device Other (Comment)   UpWalker   Gait Pattern Step-through pattern;Decreased step length - right;Decreased step length - left;Festinating;Trunk flexed;Narrow base of support    Ambulation Surface Level;Indoor    Gait Comments At end of gait, pt demo BUE tremors and shaking, which continue with transfer to her transport chair.      Self-Care   Self-Care Other Self-Care Comments    Other Self-Care Comments  Discussed POC and scheduling; pt/PT agreeable to going back to 1x/wk frequency for 2 more visits; likely discharge at that time.  Pt has multiple questions about various symtpoms-changing memory/difficulty sleeping and questions if these are in relation to Sinemet.  Requested that pt/husband document symtpoms and questions and follow up specifically with Dr. Carles Collet, as these typically are not symptoms/side effects of Sinemet.      Exercises   Exercises Knee/Hip      Knee/Hip Exercises: Standing   Functional Squat 2 sets;5 reps    Functional Squat Limitations Standing at locked UpWalker:  gentle minisquats then return to tall stand with quad set/glut sets.  PT with min guard throughout.  Seated rest break between sets.      Knee/Hip Exercises: Supine   Short Arc Quad Sets Strengthening;Right;Left;2 sets;10 reps    Straight Leg Raises Strengthening;Left;Right;1 set;10 reps    Other Supine Knee/Hip Exercises Hooklying marching x 10 reps, alternating legs.                  PT Education - 11/23/20 1005    Education Details HEP additions    Person(s) Educated Patient;Spouse    Methods Explanation;Demonstration;Handout    Comprehension Verbalized understanding;Returned demonstration            PT Short Term Goals - 11/20/20 1202      PT SHORT TERM GOAL #1   Title Pt will perform HEP with husband's supervision, for lower extremity strength and transfer safety.  TARGET 11/16/20    Time 4    Period Weeks    Status Achieved      PT SHORT TERM GOAL #2   Title  Pt will perform standing at counter/sink for 3 minutes with min guard for improved ADL participation.    Baseline 1:30 sec 10/18/20; 3:15 11/20/20    Time 4    Period Weeks    Status Achieved             PT Long Term Goals - 10/18/20 1205      PT LONG TERM GOAL #1   Title Pt/husband will demo correct transfer technique for sit<>stand from chair to [employer].  TARGET 12/14/20    Baseline needs min guard and reminder cues on proper technique to stand min A for safety due to uncontrolled descent and pt reporting her head  feels heavy in standing.    Time 8    Period Weeks    Status On-going      PT LONG TERM GOAL #2   Title Pt will stand at least 4 minutes with UE support with no episode of retropulsion, for improved standing to participate in ADLs.    Baseline retropulsion upon standing; 3/24:  2 standing bouts of 1:30 each, no retropulsion    Time 8    Period Weeks    Status Revised      PT LONG TERM GOAL #3   Title Pt will ambulate 50-100 ft using UpWalker, with min guard assist, for improved gait efficiency and tolerance in the home.    Baseline 10/18/20:  1.47 ft/sec    Time 48    Period Weeks    Status New                 Plan - 11/23/20 1006    Clinical Impression Statement Additions given to HEP for supine lower extremity strenghtening exercises.  Pt able to tolerate standing quad sets/gentle minisquats as a way to help regain upright posture quickly when her knees buckle.  Today, at end of session, she has episode of BUEs tremor/shaking which is intermittent through end of session.  Encoruaged pt/husband to document symptoms and questions for visit with Dr. Carles Collet in June.    Personal Factors and Comorbidities Comorbidity 3+    Comorbidities See full problem list, but of note:  CHF, pacemaker placement, chronic back pain, hx of compression fractures    Examination-Activity Limitations Locomotion Level;Transfers;Hygiene/Grooming;Toileting;Dressing;Stand     Examination-Participation Restrictions Meal Prep;Community Activity    Stability/Clinical Decision Making Unstable/Unpredictable    Rehab Potential Fair    PT Frequency 2x / week   (retro update to POC/recert for 02/04/6268 to include 2x/wk)   PT Duration 8 weeks   per recert 4/85/4627   PT Treatment/Interventions ADLs/Self Care Home Management;DME Instruction;Neuromuscular re-education;Balance training;Therapeutic exercise;Therapeutic activities;Functional mobility training;Gait training;Patient/family education;Wheelchair mobility training    PT Next Visit Plan Review HEP additions, standing tolerance with gentle minisquats/standing quad sets.  Gait with UpWalker, transfer safety with pt/husband education.  Work towards [employer].    Consulted and Agree with Plan of Care Patient;Family member/caregiver    Family Member Consulted Husband           Patient will benefit from skilled therapeutic intervention in order to improve the following deficits and impairments:  Abnormal gait,Difficulty walking,Decreased safety awareness,Decreased activity tolerance,Decreased balance,Decreased mobility,Decreased strength,Postural dysfunction  Visit Diagnosis: Muscle weakness (generalized)  Other abnormalities of gait and mobility  Unsteadiness on feet     Problem List Patient Active Problem List   Diagnosis Date Noted  . IBS (irritable bowel syndrome) 10/28/2020  . Tremor of both hands 07/30/2020  . Left arm swelling 04/10/2020  . Aortic atherosclerosis ([place]) 03/30/2020  . Cellulitis of arm, left 03/30/2020  . Chronic thoracic back pain 02/03/2020  . Iron deficiency anemia 02/02/2020  . Near syncope 09/27/2019  . Hypertension 09/20/2019  . Acute pain of right knee 08/03/2019  . (HFpEF) heart failure with preserved ejection fraction ([place]) 08/02/2019  . Pacemaker 07/19/2019  . CKD (chronic kidney disease) 01/30/2019  . Lumbar spinal stenosis 01/10/2018  . Osteoarthritis of both knees 12/15/2017   . Epigastric pain 09/09/2017  . Neuralgia 09/09/2017  . Claudication of calf muscles ([place]) 11/22/2016  . DOE (dyspnea on exertion) 11/22/2016  . Hyperreflexia of lower extremity 07/16/2016  . Bilateral leg paresthesia 07/16/2016  .  Carotid arterial disease ([place]) 07/13/2016  . Poor balance 06/23/2016  . Weakness of both lower extremities 06/23/2016  . Sinus node dysfunction ([place]) 05/05/2016  . Right shoulder pain 03/14/2016  . Multinodular goiter (nontoxic) 02/21/2016  . Neck pain 02/15/2016  . Prediabetes 12/17/2015  . Dysphagia 12/17/2015  . Anxiety 11/13/2015  . T8 vertebral fracture ([place]) 04/09/2015  . Overweight 10/09/2011  . Allergic rhinitis, cause unspecified   . GERD (gastroesophageal reflux disease) 02/05/2011  . Vitamin B 12 deficiency 04/17/2009  . Hyperlipidemia 04/17/2009  . Osteoporosis 04/17/2009  . ADENOCARCINOMA, BREAST, HX OF 04/17/2009  . Constipation 03/08/2009  . COLONIC POLYPS, HYPERPLASTIC, HX OF 03/06/2009    Dayani Winbush W. 11/23/2020, 10:10 AM  Frazier Butt., PT   [facility] [address] [place], [place], [address] Phone: [phone]   Fax:  [phone]  Name: YER CASTELLO MRN: [id number] Date of Birth: [birth year]

## 2020-11-23 NOTE — Progress Notes (Signed)
    Chronic Care Management Pharmacy Assistant   Name: [name]  MRN: [id number] DOB: [birth year]   Reason for Encounter: Chart Review    Medications: Outpatient Encounter Medications as of 11/05/2020  Medication Sig  . acetaminophen (TYLENOL) 500 MG tablet Take 500 mg by mouth 2 (two) times daily.  . carbidopa-levodopa (SINEMET IR) 25-100 MG tablet Take 1 tablet by mouth 3 (three) times daily. 7am/11am/4pm  . cholecalciferol (VITAMIN D) 1000 UNITS tablet Take 2,000 Units by mouth daily.   . Cyanocobalamin 1000 MCG TBCR Take 1,000 mcg by mouth daily.   [name] denosumab (PROLIA) 60 MG/ML SOSY injection Inject 60 mg into the skin every 6 (six) months.  . Diclofenac Sodium (PENNSAID) 2 % SOLN Apply topically.  . diclofenac Sodium (VOLTAREN) 1 % GEL Apply 2 g topically 4 (four) times daily.  [name] dicyclomine (BENTYL) 10 MG capsule Take 1 capsule (10 mg total) by mouth 3 (three) times daily before meals. (Patient not taking: Reported on 10/29/2020)  . escitalopram (LEXAPRO) 5 MG tablet Take 1 tablet (5 mg total) by mouth daily.  [name] ezetimibe (ZETIA) 10 MG tablet Take 1 tablet (10 mg total) by mouth daily.  . ferrous sulfate 325 (65 FE) MG tablet Take 325 mg by mouth 3 (three) times a week.  . furosemide (LASIX) 40 MG tablet Take one tablet in the AM and 1/2 tablet after lunch.  . loratadine (CLARITIN) 10 MG tablet Take 10 mg by mouth daily as needed for allergies.  . metoprolol succinate (TOPROL XL) 25 MG 24 hr tablet Take 1 tablet (25 mg total) by mouth daily. (Patient taking differently: Take 12.5 mg by mouth daily.)  . omeprazole (PRILOSEC) 40 MG capsule TAKE ONE CAPSULE BY MOUTH TWICE A DAY  . Polyethyl Glycol-Propyl Glycol (LUBRICANT EYE DROPS) 0.4-0.3 % SOLN Place 1 drop into both eyes 3 (three) times daily as needed (dry/irritated eyes.).  [name] polyethylene glycol (MIRALAX / GLYCOLAX) packet Take 17 g by mouth daily as needed (constipation).  . potassium chloride (KLOR-CON) 10 MEQ tablet Take  2 tablets (20 mEq total) by mouth daily.  . sodium chloride (OCEAN) 0.65 % SOLN nasal spray Place 1 spray into both nostrils 4 (four) times daily as needed for congestion.   No facility-administered encounter medications on file as of 11/05/2020.    Reviewed chart for medication changes and adherence.   No gaps in adherence identified. Patient has follow up scheduled with pharmacy team. No further action required.   [facility] Pharmacist Assistant [phone]  Time spent:9

## 2020-11-23 NOTE — Addendum Note (Signed)
Addended by: Frazier Butt on: 11/23/2020 10:15 AM   Modules accepted: Orders

## 2020-11-27 ENCOUNTER — Other Ambulatory Visit: Payer: Self-pay

## 2020-11-27 ENCOUNTER — Encounter: Payer: Self-pay | Admitting: Physical Therapy

## 2020-11-27 ENCOUNTER — Ambulatory Visit: Payer: Medicare Other

## 2020-11-27 ENCOUNTER — Ambulatory Visit: Payer: Medicare Other | Attending: Neurology | Admitting: Physical Therapy

## 2020-11-27 VITALS — BP 142/56 | HR 62

## 2020-11-27 DIAGNOSIS — M6281 Muscle weakness (generalized): Secondary | ICD-10-CM | POA: Insufficient documentation

## 2020-11-27 DIAGNOSIS — R2681 Unsteadiness on feet: Secondary | ICD-10-CM | POA: Diagnosis not present

## 2020-11-27 DIAGNOSIS — R29818 Other symptoms and signs involving the nervous system: Secondary | ICD-10-CM | POA: Diagnosis not present

## 2020-11-27 DIAGNOSIS — R2689 Other abnormalities of gait and mobility: Secondary | ICD-10-CM | POA: Diagnosis not present

## 2020-11-27 NOTE — Therapy (Signed)
[facility] [address] [place], [place], [address] Phone: [phone]   Fax:  [phone]  Physical Therapy Treatment  Patient Details  Name: [name] MRN: [id number] Date of Birth: 01-14-1935 Referring Provider (PT): Tat, Wells Guiles   Encounter Date: 11/27/2020   PT End of Session - 11/27/20 1109    Visit Number 12    Number of Visits 13    Date for PT Re-Evaluation 69/48/54   recert for 90 days; 8 wk POC (recert completed 01/21/02)   Authorization Type Medicare/AARP    PT Start Time 1107    PT Stop Time 1146    PT Time Calculation (min) 39 min    Equipment Utilized During Treatment Gait belt    Activity Tolerance Patient tolerated treatment well    Behavior During Therapy Flat affect;WFL for tasks assessed/performed           Past Medical History:  Diagnosis Date  . Allergy   . Anemia   . Cancer of left breast ([place]) 1978  . CHF (congestive heart failure) ([place])   . Chronic thoracic back pain    "T8; fracture; 03/2015; no OR" (05/05/2016)  . GERD (gastroesophageal reflux disease)   . Heart murmur   . History of hiatal hernia   . Hyperlipidemia   . Hyperplastic colon polyp   . Hypertension   . IBS (irritable bowel syndrome)   . Multiple thyroid nodules   . Osteoporosis    T8 compression fx 03/2015   . Personal history of radiation therapy   . Presence of permanent cardiac pacemaker   . Vitamin B12 deficiency     Past Surgical History:  Procedure Laterality Date  . ANTERIOR CERVICAL DECOMP/DISCECTOMY FUSION  2008   C5-6  . AUGMENTATION MAMMAPLASTY    . BACK SURGERY    . BREAST SURGERY    . CARDIAC CATHETERIZATION  2001  . EP IMPLANTABLE DEVICE N/A 05/05/2016   Procedure: Pacemaker Implant;  Surgeon: Evans Lance, MD;  Location: [facility];  Service: Cardiovascular;  Laterality: N/A;  . EXCISIONAL HEMORRHOIDECTOMY  1984   With subsequent correction of surgery  . INSERT / REPLACE /  REMOVE PACEMAKER    . INTRAVASCULAR PRESSURE WIRE/FFR STUDY N/A 10/07/2019   Procedure: INTRAVASCULAR PRESSURE WIRE/FFR STUDY;  Surgeon: Jettie Booze, MD;  Location: [facility];  Service: Cardiovascular;  Laterality: N/A;  . KNEE ARTHROSCOPY Left    "meniscus tear"  . LEFT HEART CATH AND CORONARY ANGIOGRAPHY N/A 10/07/2019   Procedure: LEFT HEART CATH AND CORONARY ANGIOGRAPHY;  Surgeon: Jettie Booze, MD;  Location: [facility];  Service: Cardiovascular;  Laterality: N/A;  . MASTECTOMY Left 1978  . PLACEMENT OF BREAST IMPLANTS Left 1981  . REDUCTION MAMMAPLASTY Right   . SHOULDER ARTHROSCOPY W/ ROTATOR CUFF REPAIR Left 02/2006   "tear"  . TUBAL LIGATION      Vitals:   11/27/20 1119  BP: (!) 142/56  Pulse: 62     Subjective Assessment - 11/27/20 1108    Subjective Had a busy weekend, but good.  Got my hearing aids yesterday and now I hear all the background noise.  Husband inquires about helping patient walk at home.    Patient is accompained by: Family member   spouse   Pertinent History PMH includes CHF, pacemaker placement 2017, cataract surgery, compression fracture, chronic back pain.    Patient Stated Goals Pt's goals for therapy are to -Unsure-this is just  happening so fast.  Pt agrees to working to lessen fall risk and improve safe mobility.    Currently in Pain? No/denies                             [facility] Adult PT Treatment/Exercise - 11/27/20 0001      Transfers   Transfers Sit to Stand;Stand to Sit;Stand Pivot Transfers    Sit to Stand 4: Min guard;With upper extremity assist;From chair/3-in-1    Sit to Stand Details (indicate cue type and reason) Upon standing, pt has several episodes of knees near-buckling, pt able to self-correct with weightshifting and return to upright posture.    Stand to Sit 4: Min guard;With upper extremity assist;To chair/3-in-1    Stand Pivot Transfers 4: Min guard    Stand Pivot Transfer Details  (indicate cue type and reason) w/c<>mat table    Comments Standing 2:50, 3 episodes of cues to relax/weightshift/quad set to offset tremors.  Additional approx 30 sec of standing at sink, with pt experiencing BUE tremors, which cause whole body to tremor.  Assistance to sit in locked transport chair.      Ambulation/Gait   Ambulation/Gait Yes    Ambulation/Gait Assistance 4: Min guard;4: Min assist    Ambulation Distance (Feet) 25 Feet    Assistive device Other (Comment)   UpWalker   Gait Pattern Step-through pattern;Decreased step length - right;Decreased step length - left;Festinating;Trunk flexed;Narrow base of support    Ambulation Surface Level;Indoor    Gait Comments Discussed safety with gait per husband's question about gait at home.  Attempted gait with PT assist of pt and husband pushing w/c behind.  After about 25 ft, pt experiences episode of knees buckling, whole body tremor, and PT assists pt to safely sit.  Had frank discussion with pt and husband that gait using UPWalker is NOT going to be safe with gait at this time due to unpredictable nature of pt's tremor symptoms.  When she begins either upper body or lower body tremors, she often needs assist to sit safely and PT explains that husband alone would not be able to safely provide assistance with pt and with w/c and PT feels it would put them both at risk of falls. Recommend that husband use gait belt at home when pt is up and transferring.      Exercises   Exercises Knee/Hip    Other Exercises  Reviewed HEP from last visit and pt return demo understanding.            Supine Short Arc Quad - 1 x daily - 5 x weekly - 2 sets - 10 reps Small Range Straight Leg Raise - 1 x daily - 5 x weekly - 2 sets - 10 reps Supine March - 1 x daily - 5 x weekly - 2 sets - 10 reps         PT Short Term Goals - 11/20/20 1202      PT SHORT TERM GOAL #1   Title Pt will perform HEP with husband's supervision, for lower extremity strength  and transfer safety.  TARGET 11/16/20    Time 4    Period Weeks    Status Achieved      PT SHORT TERM GOAL #2   Title Pt will perform standing at counter/sink for 3 minutes with min guard for improved ADL participation.    Baseline 1:30 sec 10/18/20; 3:15 11/20/20    Time 4  Period Weeks    Status Achieved             PT Long Term Goals - 10/18/20 1205      PT LONG TERM GOAL #1   Title Pt/husband will demo correct transfer technique for sit<>stand from chair to [employer].  TARGET 12/14/20    Baseline needs min guard and reminder cues on proper technique to stand min A for safety due to uncontrolled descent and pt reporting her head feels heavy in standing.    Time 8    Period Weeks    Status On-going      PT LONG TERM GOAL #2   Title Pt will stand at least 4 minutes with UE support with no episode of retropulsion, for improved standing to participate in ADLs.    Baseline retropulsion upon standing; 3/24:  2 standing bouts of 1:30 each, no retropulsion    Time 8    Period Weeks    Status Revised      PT LONG TERM GOAL #3   Title Pt will ambulate 50-100 ft using UpWalker, with min guard assist, for improved gait efficiency and tolerance in the home.    Baseline 10/18/20:  1.47 ft/sec    Time 48    Period Weeks    Status New                 Plan - 11/27/20 1609    Clinical Impression Statement Pt return demo understanding of HEP provided last visit.  Education provided today that pt needs to continue with seated and supine HEP; educated that pt needs to only be performing standing/transfers at home with husband's assist, no gait at this time with UpWalker, as pt's tremors are inconsistent and unpredictable and put pt/husband at increased risk of falls.  Plan for checking LTGs next visit wiht plan to d/c, as pt's functional mobility continues to fluctuate and progress limited due to motor fluctuations.    Personal Factors and Comorbidities Comorbidity 3+    Comorbidities  See full problem list, but of note:  CHF, pacemaker placement, chronic back pain, hx of compression fractures    Examination-Activity Limitations Locomotion Level;Transfers;Hygiene/Grooming;Toileting;Dressing;Stand    Examination-Participation Restrictions Meal Prep;Community Activity    Stability/Clinical Decision Making Unstable/Unpredictable    Rehab Potential Fair    PT Frequency 2x / week   (retro update to POC/recert for 12/23/4130 to include 2x/wk)   PT Duration 8 weeks   per recert 4/40/1027   PT Treatment/Interventions ADLs/Self Care Home Management;DME Instruction;Neuromuscular re-education;Balance training;Therapeutic exercise;Therapeutic activities;Functional mobility training;Gait training;Patient/family education;Wheelchair mobility training    PT Next Visit Plan Check LTGs and answer any questions/safety recommendations pt may have.  Plan for d/c next visit.    Consulted and Agree with Plan of Care Patient;Family member/caregiver    Family Member Consulted Husband           Patient will benefit from skilled therapeutic intervention in order to improve the following deficits and impairments:  Abnormal gait,Difficulty walking,Decreased safety awareness,Decreased activity tolerance,Decreased balance,Decreased mobility,Decreased strength,Postural dysfunction  Visit Diagnosis: Unsteadiness on feet  Muscle weakness (generalized)  Other abnormalities of gait and mobility     Problem List Patient Active Problem List   Diagnosis Date Noted  . IBS (irritable bowel syndrome) 10/28/2020  . Tremor of both hands 07/30/2020  . Left arm swelling 04/10/2020  . Aortic atherosclerosis ([facility]) 03/30/2020  . Cellulitis of arm, left 03/30/2020  . Chronic thoracic back pain 02/03/2020  . Iron deficiency anemia  02/02/2020  . Near syncope 09/27/2019  . Hypertension 09/20/2019  . Acute pain of right knee 08/03/2019  . (HFpEF) heart failure with preserved ejection fraction ([place]) 08/02/2019   . Pacemaker 07/19/2019  . CKD (chronic kidney disease) 01/30/2019  . Lumbar spinal stenosis 01/10/2018  . Osteoarthritis of both knees 12/15/2017  . Epigastric pain 09/09/2017  . Neuralgia 09/09/2017  . Claudication of calf muscles ([place]) 11/22/2016  . DOE (dyspnea on exertion) 11/22/2016  . Hyperreflexia of lower extremity 07/16/2016  . Bilateral leg paresthesia 07/16/2016  . Carotid arterial disease ([place]) 07/13/2016  . Poor balance 06/23/2016  . Weakness of both lower extremities 06/23/2016  . Sinus node dysfunction ([place]) 05/05/2016  . Right shoulder pain 03/14/2016  . Multinodular goiter (nontoxic) 02/21/2016  . Neck pain 02/15/2016  . Prediabetes 12/17/2015  . Dysphagia 12/17/2015  . Anxiety 11/13/2015  . T8 vertebral fracture ([place]) 04/09/2015  . Overweight 10/09/2011  . Allergic rhinitis, cause unspecified   . GERD (gastroesophageal reflux disease) 02/05/2011  . Vitamin B 12 deficiency 04/17/2009  . Hyperlipidemia 04/17/2009  . Osteoporosis 04/17/2009  . ADENOCARCINOMA, BREAST, HX OF 04/17/2009  . Constipation 03/08/2009  . COLONIC POLYPS, HYPERPLASTIC, HX OF 03/06/2009    Janisse Ghan W. 11/27/2020, 4:12 PM  Frazier Butt., PT   [facility] [address] [place], [place], [address] Phone: [phone]   Fax:  [phone]  Name: [name] MRN: [id number] Date of Birth: 09/18/1934

## 2020-11-29 ENCOUNTER — Ambulatory Visit: Payer: Medicare Other | Admitting: Physical Therapy

## 2020-12-04 ENCOUNTER — Ambulatory Visit: Payer: Medicare Other | Admitting: Physical Therapy

## 2020-12-04 ENCOUNTER — Encounter: Payer: Self-pay | Admitting: Physical Therapy

## 2020-12-04 ENCOUNTER — Other Ambulatory Visit: Payer: Self-pay

## 2020-12-04 DIAGNOSIS — M6281 Muscle weakness (generalized): Secondary | ICD-10-CM

## 2020-12-04 DIAGNOSIS — R2689 Other abnormalities of gait and mobility: Secondary | ICD-10-CM | POA: Diagnosis not present

## 2020-12-04 DIAGNOSIS — R29818 Other symptoms and signs involving the nervous system: Secondary | ICD-10-CM | POA: Diagnosis not present

## 2020-12-04 DIAGNOSIS — R2681 Unsteadiness on feet: Secondary | ICD-10-CM

## 2020-12-05 NOTE — Therapy (Signed)
[facility] [address] [place], [place], [address] Phone: [phone]   Fax:  [phone]  Physical Therapy Treatment/Discharge Summary  Patient Details  Name: [name] MRN: [id number] Date of Birth: Aug 16, 1934 Referring Provider (PT): Tat, Leon THERAPY DISCHARGE SUMMARY  Visits from Start of Care: 13  Current functional level related to goals / functional outcomes: See LTGs below.  Pt demo episodes in therapy and reports episodes at home with upper extremity tremors, lower extremity tremors with legs buckling at times, that are unpredictable in nature and have limited safe progression of standing and gait activities.   Remaining deficits: See above; pt is at fall risk based on unpredictable episodes causing unstable and unsafe functional mobility.  Her tremors appear to be either bilateral UE or bilateral lower extremity in nature and can progress to her whole body tremoring, causing significant festinating/freezing episode, with pt needing assist to sit safely from a standing position.   Education / Equipment: Educated in [employer], safety with transfers and to hold on gait at this time due to fall risk.   Plan: Patient agrees to discharge.  Patient goals were not met. Patient is being discharged due to lack of progress.  ?????Pt appears to have maximized OP rehab potential at this time.  In the future, she may benefit from home health physical therapy to address safety needs in her home environment.          Mady Haagensen, PT 12/05/20 8:05 PM Phone: [phone] Fax: [phone]    Encounter Date: 12/04/2020   PT End of Session - 12/05/20 1952    Visit Number 13    Number of Visits 13    Date for PT Re-Evaluation 29/93/71   recert for 90 days; 8 wk POC (recert completed 6/96/78)   Authorization Type Medicare/AARP    PT Start Time 1234    PT Stop Time 1314    PT Time Calculation (min)  40 min    Equipment Utilized During Treatment Gait belt    Activity Tolerance Patient tolerated treatment well   Pt with c/o shortness of breath-O2 sats 98-99% when checked   Behavior During Therapy Flat affect;WFL for tasks assessed/performed           Past Medical History:  Diagnosis Date  . Allergy   . Anemia   . Cancer of left breast ([place]) 1978  . CHF (congestive heart failure) ([place])   . Chronic thoracic back pain    "T8; fracture; 03/2015; no OR" (05/05/2016)  . GERD (gastroesophageal reflux disease)   . Heart murmur   . History of hiatal hernia   . Hyperlipidemia   . Hyperplastic colon polyp   . Hypertension   . IBS (irritable bowel syndrome)   . Multiple thyroid nodules   . Osteoporosis    T8 compression fx 03/2015   . Personal history of radiation therapy   . Presence of permanent cardiac pacemaker   . Vitamin B12 deficiency     Past Surgical History:  Procedure Laterality Date  . ANTERIOR CERVICAL DECOMP/DISCECTOMY FUSION  2008   C5-6  . AUGMENTATION MAMMAPLASTY    . BACK SURGERY    . BREAST SURGERY    . CARDIAC CATHETERIZATION  2001  . EP IMPLANTABLE DEVICE N/A 05/05/2016   Procedure: Pacemaker Implant;  Surgeon: Evans Lance, MD;  Location: [facility];  Service: Cardiovascular;  Laterality: N/A;  . EXCISIONAL [place]   With subsequent  correction of surgery  . INSERT / REPLACE / REMOVE PACEMAKER    . INTRAVASCULAR PRESSURE WIRE/FFR STUDY N/A 10/07/2019   Procedure: INTRAVASCULAR PRESSURE WIRE/FFR STUDY;  Surgeon: Jettie Booze, MD;  Location: [facility];  Service: Cardiovascular;  Laterality: N/A;  . KNEE ARTHROSCOPY Left    "meniscus tear"  . LEFT HEART CATH AND CORONARY ANGIOGRAPHY N/A 10/07/2019   Procedure: LEFT HEART CATH AND CORONARY ANGIOGRAPHY;  Surgeon: Jettie Booze, MD;  Location: [facility];  Service: Cardiovascular;  Laterality: N/A;  . MASTECTOMY Left 1978  . PLACEMENT OF BREAST IMPLANTS Left  1981  . REDUCTION MAMMAPLASTY Right   . SHOULDER ARTHROSCOPY W/ ROTATOR CUFF REPAIR Left 02/2006   "tear"  . TUBAL LIGATION      There were no vitals filed for this visit.   Subjective Assessment - 12/04/20 1234    Subjective Yesterday was a good day.  Did a little bit of walking yesterday in the apartment hall; gave out of breath and was able to lock the walker and sit with husband's help.  Yesterday was the best day; the other days not so good.  I feel like I'm giving out of breath more.  Keeping notes in my book to share with Dr. Carles Collet.    Patient is accompained by: Family member   spouse   Pertinent History PMH includes CHF, pacemaker placement 2017, cataract surgery, compression fracture, chronic back pain.    Patient Stated Goals Pt's goals for therapy are to -Unsure-this is just happening so fast.  Pt agrees to working to lessen fall risk and improve safe mobility.    Currently in Pain? Yes    Pain Score --   pain, but not that bad   Pain Location Knee    Pain Orientation Right;Left    Pain Descriptors / Indicators Aching    Pain Type Chronic pain    Aggravating Factors  standing and walking    Pain Relieving Factors getting off of legs, sit and elevate legs.                             [facility] Adult PT Treatment/Exercise - 12/04/20 1235      Transfers   Transfers Sit to Stand;Stand to [employer]    Sit to Stand 4: Min guard;With upper extremity assist;From chair/3-in-1    Sit to Stand Details (indicate cue type and reason) Performed sit to stand x 2 reps at sink with pt's hands at sink and PT assist at gait belt.  Cues for forward lean to assist with transfer to avoid pulling too much with her arms.    Stand to Sit 4: Min guard;With upper extremity assist;To chair/3-in-1    Stand Pivot Transfers 4: Min guard;4: Min Doctor, general practice Details (indicate cue type and reason) w/c<>mat table    Comments Practiced stand pivot transfer w/c  to mat, 3 reps with husband performing transfer.  Cues provided for appropriate hand hold assist.  Trialed use of gait belt, but husband feels this is too hard on his back to do this.  Agreed to use more secure hand hold assist for transfer.      Self-Care   Self-Care Other Self-Care Comments    Other Self-Care Comments  Discussed progress towards goals and that fluctuating mobility tremors/shortness of breath/feeling of legs giving way has limitied pt's activitiy progression in therapy, as this puts  her at higher risk of falls.  Reiterated to pt and husband that unless he has extra assistance for walking, walking at home is not safe due to pt's unpredictable episodes of upper extremity or lower extremity trremors that cause pt to need to sit immediately.  Discussed plans for d/c today and pt/husband in agreement.      Therapeutic Activites    Therapeutic Activities Other Therapeutic Activities    Other Therapeutic Activities Stand at sink x 2 reps with UE support at sink:  2 minutes, then 1:30 with min guard assist.  Pt begins to have UE tremors and shortness of breath and PT assists to return to sit.  Pt's O2 sats throughout session, checked several times are 98-99%.                  PT Education - 12/05/20 1952    Education Details POC and discharge plans today    Person(s) Educated Patient;Spouse    Methods Explanation    Comprehension Verbalized understanding            PT Short Term Goals - 11/20/20 1202      PT SHORT TERM GOAL #1   Title Pt will perform HEP with husband's supervision, for lower extremity strength and transfer safety.  TARGET 11/16/20    Time 4    Period Weeks    Status Achieved      PT SHORT TERM GOAL #2   Title Pt will perform standing at counter/sink for 3 minutes with min guard for improved ADL participation.    Baseline 1:30 sec 10/18/20; 3:15 11/20/20    Time 4    Period Weeks    Status Achieved             PT Long Term Goals - 12/04/20 1237       PT LONG TERM GOAL #1   Title Pt/husband will demo correct transfer technique for sit<>stand from chair to [employer].  TARGET 12/14/20    Baseline Education/demo with husband on sit<>stand and stand pivot transfers    Time 8    Period Weeks    Status Partially Met      PT LONG TERM GOAL #2   Title Pt will stand at least 4 minutes with UE support with no episode of retropulsion, for improved standing to participate in ADLs.    Baseline 2 bouts of standing:  2 minutes, 1:30, no retropulsion but tremors and SOB causing pt to need to sit.    Time 8    Period Weeks    Status Not Met      PT LONG TERM GOAL #3   Title Pt will ambulate 50-100 ft using [employer], with min guard assist, for improved gait efficiency and tolerance in the home.    Baseline 10/18/20:  1.47 ft/sec    Time 48    Period Weeks    Status Deferred   Gait not safe at this time due to pt's unpredictable tremors.                Plan - 12/04/20 1237    Clinical Impression Statement LTG 1 partially met, LTG 2 not met, and LTG 3 deferred.  Pt's fluctuating mobility levels due to episodes of upper or lower body tremors put pt at high risk of falls.  Reeducated pt and husband to hold off on gait at this time due to this.  Pt is appropriate for d/c at this time.  Discussed with patient that  therapy in the future may best be home health to address safety needs with moiblity in the home.    Personal Factors and Comorbidities Comorbidity 3+    Comorbidities See full problem list, but of note:  CHF, pacemaker placement, chronic back pain, hx of compression fractures    Examination-Activity Limitations Locomotion Level;Transfers;Hygiene/Grooming;Toileting;Dressing;Stand    Examination-Participation Restrictions Meal Prep;Community Activity    Stability/Clinical Decision Making Unstable/Unpredictable    Rehab Potential Fair    PT Frequency 2x / week   (retro update to POC/recert for 11/08/2393 to include 2x/wk)   PT Duration 8  weeks   per recert 10/14/2332   PT Treatment/Interventions ADLs/Self Care Home Management;DME Instruction;Neuromuscular re-education;Balance training;Therapeutic exercise;Therapeutic activities;Functional mobility training;Gait training;Patient/family education;Wheelchair mobility training    PT Next Visit Plan d/c PT    Consulted and Agree with Plan of Care Patient;Family member/caregiver    Family Member Consulted Husband           Patient will benefit from skilled therapeutic intervention in order to improve the following deficits and impairments:  Abnormal gait,Difficulty walking,Decreased safety awareness,Decreased activity tolerance,Decreased balance,Decreased mobility,Decreased strength,Postural dysfunction  Visit Diagnosis: Unsteadiness on feet  Muscle weakness (generalized)  Other symptoms and signs involving the nervous system     Problem List Patient Active Problem List   Diagnosis Date Noted  . IBS (irritable bowel syndrome) 10/28/2020  . Tremor of both hands 07/30/2020  . Left arm swelling 04/10/2020  . Aortic atherosclerosis ([place]) 03/30/2020  . Cellulitis of arm, left 03/30/2020  . Chronic thoracic back pain 02/03/2020  . Iron deficiency anemia 02/02/2020  . Near syncope 09/27/2019  . Hypertension 09/20/2019  . Acute pain of right knee 08/03/2019  . (HFpEF) heart failure with preserved ejection fraction ([place]) 08/02/2019  . Pacemaker 07/19/2019  . CKD (chronic kidney disease) 01/30/2019  . Lumbar spinal stenosis 01/10/2018  . Osteoarthritis of both knees 12/15/2017  . Epigastric pain 09/09/2017  . Neuralgia 09/09/2017  . Claudication of calf muscles ([place]) 11/22/2016  . DOE (dyspnea on exertion) 11/22/2016  . Hyperreflexia of lower extremity 07/16/2016  . Bilateral leg paresthesia 07/16/2016  . Carotid arterial disease ([place]) 07/13/2016  . Poor balance 06/23/2016  . Weakness of both lower extremities 06/23/2016  . Sinus node dysfunction ([place]) 05/05/2016  .  Right shoulder pain 03/14/2016  . Multinodular goiter (nontoxic) 02/21/2016  . Neck pain 02/15/2016  . Prediabetes 12/17/2015  . Dysphagia 12/17/2015  . Anxiety 11/13/2015  . T8 vertebral fracture ([place]) 04/09/2015  . Overweight 10/09/2011  . Allergic rhinitis, cause unspecified   . GERD (gastroesophageal reflux disease) 02/05/2011  . Vitamin B 12 deficiency 04/17/2009  . Hyperlipidemia 04/17/2009  . Osteoporosis 04/17/2009  . ADENOCARCINOMA, BREAST, HX OF 04/17/2009  . Constipation 03/08/2009  . COLONIC POLYPS, HYPERPLASTIC, HX OF 03/06/2009    Everline Mahaffy W. 12/05/2020, 8:05 PM  Frazier Butt., PT    [facility] [address] [place], [place], [address] Phone: [phone]   Fax:  [phone]  Name: [name] MRN: [id number] Date of Birth: [birth year]

## 2020-12-06 ENCOUNTER — Ambulatory Visit: Payer: Medicare Other | Admitting: Physical Therapy

## 2020-12-11 NOTE — Progress Notes (Signed)
Subjective:    Patient ID: [name], female    DOB: Jul 20, 1935, 86 y.o.   MRN: [id number]  HPI The patient is here for an acute visit.  Swelling in knees -   The right knee has always swelled a little ( h/o of bursitis in past).  One week ago noticed left knee swelling and increased right leg swelling.  Pain when her knees touch.   She denies pain with standing.   Has pain with walking - chronic, but may be worse.    She has not been walking much - because of her shaking she can not walk unassisted and with a wheelchair behind her.  She has had several falls.  She is following with neuro.    Medications and allergies reviewed with patient and updated if appropriate.  Patient Active Problem List   Diagnosis Date Noted  . IBS (irritable bowel syndrome) 10/28/2020  . Tremor of both hands 07/30/2020  . Left arm swelling 04/10/2020  . Aortic atherosclerosis ([place]) 03/30/2020  . Cellulitis of arm, left 03/30/2020  . Chronic thoracic back pain 02/03/2020  . Iron deficiency anemia 02/02/2020  . Near syncope 09/27/2019  . Hypertension 09/20/2019  . Acute pain of right knee 08/03/2019  . (HFpEF) heart failure with preserved ejection fraction ([place]) 08/02/2019  . Pacemaker 07/19/2019  . CKD (chronic kidney disease) 01/30/2019  . Lumbar spinal stenosis 01/10/2018  . Osteoarthritis of both knees 12/15/2017  . Epigastric pain 09/09/2017  . Neuralgia 09/09/2017  . Claudication of calf muscles ([place]) 11/22/2016  . DOE (dyspnea on exertion) 11/22/2016  . Hyperreflexia of lower extremity 07/16/2016  . Bilateral leg paresthesia 07/16/2016  . Carotid arterial disease ([place]) 07/13/2016  . Poor balance 06/23/2016  . Weakness of both lower extremities 06/23/2016  . Sinus node dysfunction ([place]) 05/05/2016  . Right shoulder pain 03/14/2016  . Multinodular goiter (nontoxic) 02/21/2016  . Neck pain 02/15/2016  . Prediabetes 12/17/2015  . Dysphagia 12/17/2015  . Anxiety 11/13/2015  . T8  vertebral fracture ([place]) 04/09/2015  . Overweight 10/09/2011  . Allergic rhinitis, cause unspecified   . GERD (gastroesophageal reflux disease) 02/05/2011  . Vitamin B 12 deficiency 04/17/2009  . Hyperlipidemia 04/17/2009  . Osteoporosis 04/17/2009  . ADENOCARCINOMA, BREAST, HX OF 04/17/2009  . Constipation 03/08/2009  . COLONIC POLYPS, HYPERPLASTIC, HX OF 03/06/2009    Current Outpatient Medications on File Prior to Visit  Medication Sig Dispense Refill  . acetaminophen (TYLENOL) 500 MG tablet Take 500 mg by mouth 2 (two) times daily.    . carbidopa-levodopa (SINEMET IR) 25-100 MG tablet Take 1 tablet by mouth 3 (three) times daily. 7am/11am/4pm 270 tablet 1  . cholecalciferol (VITAMIN D) 1000 UNITS tablet Take 2,000 Units by mouth daily.     . Cyanocobalamin 1000 MCG TBCR Take 1,000 mcg by mouth daily.     [name] denosumab (PROLIA) 60 MG/ML SOSY injection Inject 60 mg into the skin every 6 (six) months.    . Diclofenac Sodium (PENNSAID) 2 % SOLN Apply topically.    . diclofenac Sodium (VOLTAREN) 1 % GEL Apply 2 g topically 4 (four) times daily.    [name] dicyclomine (BENTYL) 10 MG capsule Take 1 capsule (10 mg total) by mouth 3 (three) times daily before meals. 90 capsule 11  . escitalopram (LEXAPRO) 5 MG tablet Take 1 tablet (5 mg total) by mouth daily. 30 tablet 5  . ezetimibe (ZETIA) 10 MG tablet Take 1 tablet (10 mg total) by mouth daily. [place]  tablet 3  . ferrous sulfate 325 (65 FE) MG tablet Take 325 mg by mouth 3 (three) times a week.    . furosemide (LASIX) 40 MG tablet Take one tablet in the AM and 1/2 tablet after lunch. 90 tablet 3  . metoprolol succinate (TOPROL XL) 25 MG 24 hr tablet Take 1 tablet (25 mg total) by mouth daily. (Patient taking differently: Take 12.5 mg by mouth daily.) 90 tablet 3  . omeprazole (PRILOSEC) 40 MG capsule TAKE ONE CAPSULE BY MOUTH TWICE A DAY 60 capsule 11  . Polyethyl Glycol-Propyl Glycol (LUBRICANT EYE DROPS) 0.4-0.3 % SOLN Place 1 drop into both eyes 3  (three) times daily as needed (dry/irritated eyes.).    [name] polyethylene glycol (MIRALAX / GLYCOLAX) packet Take 17 g by mouth daily as needed (constipation).    . sodium chloride (OCEAN) 0.65 % SOLN nasal spray Place 1 spray into both nostrils 4 (four) times daily as needed for congestion.    . potassium chloride (KLOR-CON) 10 MEQ tablet Take 2 tablets (20 mEq total) by mouth daily. 180 tablet 3   No current facility-administered medications on file prior to visit.    Past Medical History:  Diagnosis Date  . Allergy   . Anemia   . Cancer of left breast ([place]) 1978  . CHF (congestive heart failure) ([place])   . Chronic thoracic back pain    "T8; fracture; 03/2015; no OR" (05/05/2016)  . GERD (gastroesophageal reflux disease)   . Heart murmur   . History of hiatal hernia   . Hyperlipidemia   . Hyperplastic colon polyp   . Hypertension   . IBS (irritable bowel syndrome)   . Multiple thyroid nodules   . Osteoporosis    T8 compression fx 03/2015   . Personal history of radiation therapy   . Presence of permanent cardiac pacemaker   . Vitamin B12 deficiency     Past Surgical History:  Procedure Laterality Date  . ANTERIOR CERVICAL DECOMP/DISCECTOMY FUSION  2008   C5-6  . AUGMENTATION MAMMAPLASTY    . BACK SURGERY    . BREAST SURGERY    . CARDIAC CATHETERIZATION  2001  . EP IMPLANTABLE DEVICE N/A 05/05/2016   Procedure: Pacemaker Implant;  Surgeon: Evans Lance, MD;  Location: [facility];  Service: Cardiovascular;  Laterality: N/A;  . EXCISIONAL HEMORRHOIDECTOMY  1984   With subsequent correction of surgery  . INSERT / REPLACE / REMOVE PACEMAKER    . INTRAVASCULAR PRESSURE WIRE/FFR STUDY N/A 10/07/2019   Procedure: INTRAVASCULAR PRESSURE WIRE/FFR STUDY;  Surgeon: Jettie Booze, MD;  Location: [facility];  Service: Cardiovascular;  Laterality: N/A;  . KNEE ARTHROSCOPY Left    "meniscus tear"  . LEFT HEART CATH AND CORONARY ANGIOGRAPHY N/A 10/07/2019   Procedure:  LEFT HEART CATH AND CORONARY ANGIOGRAPHY;  Surgeon: Jettie Booze, MD;  Location: [facility];  Service: Cardiovascular;  Laterality: N/A;  . MASTECTOMY Left 1978  . PLACEMENT OF BREAST IMPLANTS Left 1981  . REDUCTION MAMMAPLASTY Right   . SHOULDER ARTHROSCOPY W/ ROTATOR CUFF REPAIR Left 02/2006   "tear"  . TUBAL LIGATION      Social History   Socioeconomic History  . Marital status: Married    Spouse name: Not on file  . Number of children: 2  . Years of education: Not on file  . Highest education level: Not on file  Occupational History  . Occupation: retired  Tobacco Use  . Smoking status:  Never Smoker  . Smokeless tobacco: Never Used  Vaping Use  . Vaping Use: Never used  Substance and Sexual Activity  . Alcohol use: No    Alcohol/week: 0.0 standard drinks  . Drug use: No  . Sexual activity: Not Currently  Other Topics Concern  . Not on file  Social History Narrative   Housewife, Lives with spouse, 2 children   Social Determinants of Health   Financial Resource Strain: Medium Risk  . Difficulty of Paying Living Expenses: Somewhat hard  Food Insecurity: Not on file  Transportation Needs: Not on file  Physical Activity: Not on file  Stress: Not on file  Social Connections: Not on file    Family History  Problem Relation Age of Onset  . Stroke Sister   . Diabetes Brother   . Breast cancer Maternal Aunt   . Atrial fibrillation Sister   . Colon cancer Neg Hx   . Stomach cancer Neg Hx     Review of Systems  Constitutional: Negative for fever.  Musculoskeletal: Positive for arthralgias and joint swelling.       No calf pain  Neurological: Negative for numbness.       Objective:   Vitals:   12/12/20 1411  BP: 130/62  Pulse: 62  Temp: 98.2 F (36.8 C)  SpO2: 97%   BP Readings from Last 3 Encounters:  12/12/20 130/62  11/27/20 (!) 142/56  11/22/20 (!) 128/49   Wt Readings from Last 3 Encounters:  12/12/20 156 lb (70.8 kg)   10/29/20 154 lb (69.9 kg)  10/05/20 157 lb (71.2 kg)   Body mass index is 29.48 kg/m.   Physical Exam Constitutional:      General: She is not in acute distress.    Appearance: Normal appearance. She is not ill-appearing.  HENT:     Head: Normocephalic and atraumatic.  Musculoskeletal:        General: Swelling (b/l effusions) and tenderness (b/l knee tendenress with palpation) present. No deformity.     Right lower leg: No edema.     Left lower leg: No edema.  Skin:    General: Skin is warm and dry.     Findings: No bruising or erythema.  Neurological:     Mental Status: She is alert.            Assessment & Plan:    See Problem List for Assessment and Plan of chronic medical problems.    This visit occurred during the SARS-CoV-2 public health emergency.  Safety protocols were in place, including screening questions prior to the visit, additional usage of staff PPE, and extensive cleaning of exam room while observing appropriate contact time as indicated for disinfecting solutions.

## 2020-12-12 ENCOUNTER — Other Ambulatory Visit: Payer: Self-pay

## 2020-12-12 ENCOUNTER — Encounter: Payer: Self-pay | Admitting: Internal Medicine

## 2020-12-12 ENCOUNTER — Ambulatory Visit (INDEPENDENT_AMBULATORY_CARE_PROVIDER_SITE_OTHER): Payer: Medicare Other | Admitting: Internal Medicine

## 2020-12-12 VITALS — BP 130/62 | HR 62 | Temp 98.2°F | Ht 61.0 in | Wt 156.0 lb

## 2020-12-12 DIAGNOSIS — M170 Bilateral primary osteoarthritis of knee: Secondary | ICD-10-CM

## 2020-12-12 DIAGNOSIS — M25461 Effusion, right knee: Secondary | ICD-10-CM

## 2020-12-12 DIAGNOSIS — M25462 Effusion, left knee: Secondary | ICD-10-CM

## 2020-12-12 NOTE — Patient Instructions (Addendum)
    For your knees use voltaren gel and ice the knees.  You can try icy hot.      A referral was ordered for Dr Tamala Julian - sports medicine.

## 2020-12-12 NOTE — Assessment & Plan Note (Signed)
Acute on chronic B/l knee swelling and tenderness with palpation Chronic pain with walking, but not walking much No injury Likely flare of OA Would likely benefit from steroid injections voltaren gel - use 3-4 times daily Ice Tylenol Referred to sports medicine

## 2020-12-19 ENCOUNTER — Other Ambulatory Visit: Payer: Self-pay

## 2020-12-19 ENCOUNTER — Ambulatory Visit (INDEPENDENT_AMBULATORY_CARE_PROVIDER_SITE_OTHER): Payer: Medicare Other | Admitting: Pharmacist

## 2020-12-19 DIAGNOSIS — K219 Gastro-esophageal reflux disease without esophagitis: Secondary | ICD-10-CM

## 2020-12-19 DIAGNOSIS — G20 Parkinson's disease: Secondary | ICD-10-CM

## 2020-12-19 DIAGNOSIS — I10 Essential (primary) hypertension: Secondary | ICD-10-CM

## 2020-12-19 DIAGNOSIS — E782 Mixed hyperlipidemia: Secondary | ICD-10-CM

## 2020-12-19 DIAGNOSIS — M170 Bilateral primary osteoarthritis of knee: Secondary | ICD-10-CM

## 2020-12-19 MED ORDER — METOPROLOL SUCCINATE ER 25 MG PO TB24: 12.5000 mg | ORAL_TABLET | Freq: Every day | ORAL | 90 refills | Status: DC

## 2020-12-19 NOTE — Progress Notes (Signed)
Chronic Care Management Pharmacy Note  12/19/2020 Name:  [name] MRN:  [id number] DOB:  27-Sep-1934  Subjective: [name] is an 86 y.o. year old female who is a primary patient of Burns, Claudina Lick, MD.  The CCM team was consulted for assistance with disease management and care coordination needs.    Engaged with patient by telephone for follow up visit in response to provider referral for pharmacy case management and/or care coordination services.   Consent to Services:  The patient was given information about Chronic Care Management services, agreed to services, and gave verbal consent prior to initiation of services.  Please see initial visit note for detailed documentation.   Patient Care Team: [name], MD as PCP - General (Internal Medicine) Evans Lance, MD as PCP - Cardiology (Cardiology) Ladene Artist, MD (Gastroenterology) Servando Salina, MD (Obstetrics and Gynecology) Melida Quitter, MD as Consulting Physician (Otolaryngology) Allyn Kenner, MD (Dermatology) Calvert Cantor, MD (Ophthalmology) Berle Mull, MD (Sports Medicine) Jovita Gamma, MD as Consulting Physician (Neurosurgery) Evans Lance, MD as Consulting Physician (Cardiology) Martinique, Peter M, MD as Consulting Physician (Cardiology) Charlton Haws, [facility] as Pharmacist (Pharmacist)  Recent office visits: 12/12/20 Dr Quay Burow OV: knee pain; referred to sports med 10/29/20 Dr Quay Burow OV: chronic f/u; ordered bone density  Recent consult visits: PT for dysarthria and muscle weakness 10/05/20 NP Lynnell Jude (cardiology): continue to hold lasix, 1/2 metoprolol dose. May consider midodrine if needed. 09/28/20 Dr Lovena Le - advised stopping lasix and cut metoprolol in half. 09/27/20 Dr Tat (neurology): f/u Parkinson's. C/o orthostasis. Referred to cardiology to change BP meds, possibly Lasix.  08/23/20 Dr Tat (neurology): Dx Parkinson's. Rx'd Levodopa. Referred for PT and speech therapy.  Hospital  visits: None in previous 6 months  Objective:  Lab Results  Component Value Date   CREATININE 0.92 10/05/2020   BUN 12 10/05/2020   GFR 44.65 (L) 12/27/2019   GFRNONAA 38 (L) 08/30/2020   GFRAA 44 (L) 08/30/2020   NA 135 10/05/2020   K 4.9 10/05/2020   CALCIUM 8.5 (L) 10/05/2020   CO2 21 10/05/2020   GLUCOSE 87 10/05/2020    Lab Results  Component Value Date/Time   HGBA1C 5.9 (H) 02/03/2020 10:46 AM   HGBA1C 6.2 08/03/2019 09:49 AM   GFR 44.65 (L) 12/27/2019 08:10 AM   GFR 47.03 (L) 08/03/2019 09:49 AM    Last diabetic Eye exam: No results found for: HMDIABEYEEXA  Last diabetic Foot exam: No results found for: HMDIABFOOTEX   Lab Results  Component Value Date   CHOL 215 (H) 02/03/2020   HDL 58 02/03/2020   LDLCALC 132 (H) 02/03/2020   LDLDIRECT 164.6 12/13/2012   TRIG 133 02/03/2020   CHOLHDL 3.7 02/03/2020    Hepatic Function Latest Ref Rng & Units 03/30/2020 02/03/2020 12/27/2019  Total Protein 6.1 - 8.1 g/dL 6.8 6.5 6.1  Albumin 3.5 - 5.2 g/dL - - -  AST 10 - 35 U/L 11 11 -  ALT 6 - 29 U/L 8 10 -  Alk Phosphatase 39 - 117 U/L - - -  Total Bilirubin 0.2 - 1.2 mg/dL 0.4 0.4 -  Bilirubin, Direct 0.0 - 0.3 mg/dL - - -    Lab Results  Component Value Date/Time   TSH 1.85 02/03/2020 10:46 AM   TSH 2.25 12/27/2019 08:10 AM    CBC Latest Ref Rng & Units 10/05/2020 03/30/2020 02/03/2020  WBC 3.4 - 10.8 x10E3/uL 6.2 7.8 7.2  Hemoglobin 11.1 - 15.9 g/dL  10.8(L) 12.0 11.0(L)  Hematocrit 34.0 - 46.6 % 31.5(L) 36.3 33.2(L)  Platelets 150 - 450 x10E3/uL 320 333 294    Lab Results  Component Value Date/Time   VD25OH 42 02/03/2020 10:46 AM   VD25OH 73.89 12/27/2019 08:10 AM   VD25OH 64.65 11/20/2016 12:02 PM    Clinical ASCVD: No  The ASCVD Risk score Mikey Bussing DC Jr., et al., 2013) failed to calculate for the following reasons:   The 2013 ASCVD risk score is only valid for ages 40 to 23    Depression screen PHQ 2/9 02/03/2020 02/02/2019 01/31/2019  Decreased Interest 0 0 0   Down, Depressed, Hopeless 0 0 0  PHQ - 2 Score 0 0 0  Some recent data might be hidden     Social History   Tobacco Use  Smoking Status Never Smoker  Smokeless Tobacco Never Used   BP Readings from Last 3 Encounters:  12/12/20 130/62  11/27/20 (!) 142/56  11/22/20 (!) 128/49   Pulse Readings from Last 3 Encounters:  12/12/20 62  11/27/20 62  11/20/20 60   Wt Readings from Last 3 Encounters:  12/12/20 156 lb (70.8 kg)  10/29/20 154 lb (69.9 kg)  10/05/20 157 lb (71.2 kg)   BMI Readings from Last 3 Encounters:  12/12/20 29.48 kg/m  10/29/20 29.10 kg/m  10/05/20 29.66 kg/m    Assessment/Interventions: Review of patient past medical history, allergies, medications, health status, including review of consultants reports, laboratory and other test data, was performed as part of comprehensive evaluation and provision of chronic care management services.   SDOH:  (Social Determinants of Health) assessments and interventions performed: Yes  SDOH Screenings   Alcohol Screen: Not on file  Depression (PHQ2-9): Low Risk   . PHQ-2 Score: 0  Financial Resource Strain: Medium Risk  . Difficulty of Paying Living Expenses: Somewhat hard  Food Insecurity: Not on file  Housing: Not on file  Physical Activity: Not on file  Social Connections: Not on file  Stress: Not on file  Tobacco Use: Low Risk   . Smoking Tobacco Use: Never Smoker  . Smokeless Tobacco Use: Never Used  Transportation Needs: Not on file    CCM Care Plan  Allergies  Allergen Reactions  . Statins     Lipitor caused hospitalization - - depletion of electrolytes, fever, nausea, loss of appetite, couldn't get out of bed  . Cymbalta [Duloxetine Hcl] Other (See Comments)    Dulled her too much, difficulty urinating, change in vision  . Fosamax [Alendronate Sodium] Other (See Comments)    Caused chronic issues swallowing  . Amitriptyline     hallucinations  . Lyrica [Pregabalin]     Unknown  . Neurontin  [Gabapentin] Other (See Comments)    Dizziness and sedation (patient is tolerating in lower dose)  . Zanaflex [Tizanidine] Other (See Comments)    Could not sleep    Medications Reviewed Today    Reviewed by Charlton Haws, [facility] (Pharmacist) on 12/19/20 at [employer] List Status: <None>  Medication Order Taking? Sig Documenting Provider Last Dose Status Informant  acetaminophen (TYLENOL) 500 MG tablet [id number] Yes Take 500 mg by mouth 2 (two) times daily. [provider] Taking Active Self  carbidopa-levodopa (SINEMET IR) 25-100 MG tablet [id number] Yes Take 1 tablet by mouth 3 (three) times daily. 7am/11am/4pm Tat, Eustace Quail, DO Taking Active   cholecalciferol (VITAMIN D) 1000 UNITS tablet [id number] Yes Take 2,000 Units by mouth daily.  [provider] Taking Active Self  Cyanocobalamin 1000 MCG TBCR [id number] Yes Take 1,000 mcg by mouth daily.  [provider] Taking Active Self           Med Note Tammi Klippel, Nadara Mustard   Fri May 02, 2016  9:59 AM)    denosumab (PROLIA) 60 MG/ML SOSY injection [id number] Yes Inject 60 mg into the skin every 6 (six) months. [provider] Taking Active   Diclofenac Sodium (PENNSAID) 2 % SOLN [id number] Yes Apply topically. [provider] Taking Active   diclofenac Sodium (VOLTAREN) 1 % GEL [id number] Yes Apply 2 g topically 4 (four) times daily. [provider] Taking Active   dicyclomine (BENTYL) 10 MG capsule [id number] Yes Take 1 capsule (10 mg total) by mouth 3 (three) times daily before meals. Ladene Artist, MD Taking Active   escitalopram (LEXAPRO) 5 MG tablet [id number] Yes Take 1 tablet (5 mg total) by mouth daily. [name], MD Taking Active   ezetimibe (ZETIA) 10 MG tablet [id number] Yes Take 1 tablet (10 mg total) by mouth daily. [name], MD Taking Active   ferrous sulfate 325 (65 FE) MG tablet [id number] Yes Take 325 mg by mouth 3 (three) times a week. [provider] Taking  Active        Patient not taking:      Discontinued 12/19/20 1651 (Discontinued by provider)   metoprolol succinate (TOPROL XL) 25 MG 24 hr tablet [id number] Yes Take 1 tablet (25 mg total) by mouth daily.  Patient taking differently: Take 12.5 mg by mouth daily.   Evans Lance, MD Taking Active   omeprazole (PRILOSEC) 40 MG capsule [id number] Yes TAKE ONE CAPSULE BY MOUTH TWICE A DAY Ladene Artist, MD Taking Active   Polyethyl Glycol-Propyl Glycol (LUBRICANT EYE DROPS) 0.4-0.3 % SOLN [id number] Yes Place 1 drop into both eyes 3 (three) times daily as needed (dry/irritated eyes.). [provider] Taking Active Self  polyethylene glycol (MIRALAX / GLYCOLAX) packet [id number] Yes Take 17 g by mouth daily as needed (constipation). [provider] Taking Active Self        Discontinued 12/19/20 1652 (Discontinued by provider)   sodium chloride (OCEAN) 0.65 % SOLN nasal spray [id number] Yes Place 1 spray into both nostrils 4 (four) times daily as needed for congestion. [provider] Taking Active Self          Patient Active Problem List   Diagnosis Date Noted  . IBS (irritable bowel syndrome) 10/28/2020  . Tremor of both hands 07/30/2020  . Left arm swelling 04/10/2020  . Aortic atherosclerosis ([place]) 03/30/2020  . Cellulitis of arm, left 03/30/2020  . Chronic thoracic back pain 02/03/2020  . Iron deficiency anemia 02/02/2020  . Near syncope 09/27/2019  . Hypertension 09/20/2019  . Acute pain of right knee 08/03/2019  . (HFpEF) heart failure with preserved ejection fraction ([place]) 08/02/2019  . Pacemaker 07/19/2019  . CKD (chronic kidney disease) 01/30/2019  . Lumbar spinal stenosis 01/10/2018  . Osteoarthritis of both knees 12/15/2017  . Epigastric pain 09/09/2017  . Neuralgia 09/09/2017  . Claudication of calf muscles ([place]) 11/22/2016  . DOE (dyspnea on exertion) 11/22/2016  . Hyperreflexia of lower extremity 07/16/2016  . Bilateral leg paresthesia  07/16/2016  . Carotid arterial disease ([place]) 07/13/2016  . Poor balance 06/23/2016  . Weakness of both lower extremities 06/23/2016  . Sinus node dysfunction ([place]) 05/05/2016  . Right shoulder pain 03/14/2016  . Multinodular goiter (nontoxic) 02/21/2016  . Neck pain 02/15/2016  . Prediabetes  12/17/2015  . Dysphagia 12/17/2015  . Anxiety 11/13/2015  . T8 vertebral fracture ([place]) 04/09/2015  . Overweight 10/09/2011  . Allergic rhinitis, cause unspecified   . GERD (gastroesophageal reflux disease) 02/05/2011  . Vitamin B 12 deficiency 04/17/2009  . Hyperlipidemia 04/17/2009  . Osteoporosis 04/17/2009  . ADENOCARCINOMA, BREAST, HX OF 04/17/2009  . Constipation 03/08/2009  . COLONIC POLYPS, HYPERPLASTIC, HX OF 03/06/2009    Immunization History  Administered Date(s) Administered  . Fluad Quad(high Dose 65+) 05/17/2019, 05/26/2020  . Influenza Split 06/27/2011, 06/09/2012  . Influenza Whole 07/09/2009, 07/02/2010  . Influenza, High Dose Seasonal PF 05/02/2013, 05/31/2014, 06/26/2015, 06/02/2016, 05/22/2017, 05/17/2018  . PFIZER(Purple Top)SARS-COV-2 Vaccination 09/10/2019, 10/03/2019, 07/13/2020  . Pneumococcal Conjugate-13 06/19/2014  . Pneumococcal Polysaccharide-23 07/28/2000, 10/09/2011  . Td 07/28/2000  . Tdap 10/15/2010  . Zoster 03/12/2011    Conditions to be addressed/monitored:  GERD and IBS, Parkinson's disease  Patient Care Plan: CCM Pharmacy Care Plan    Problem Identified: Parkinson's Disease, IBS, GERD   Priority: High    Long-Range Goal: Disease management   Start Date: 10/29/2020  Expected End Date: 04/30/2021  Recent Progress: On track  Priority: High  Note:   Current Barriers:  . Unable to independently monitor therapeutic efficacy . Unsure of how to self administer medications as prescribed  Pharmacist Clinical Goal(s):  [name] Patient will achieve adherence to monitoring guidelines and medication adherence to achieve therapeutic efficacy . achieve  ability to self administer medications as prescribed through use of pill box as evidenced by patient report through collaboration with PharmD and provider.   Interventions: . 1:1 collaboration with [name], MD regarding development and update of comprehensive plan of care as evidenced by provider attestation and co-signature . Inter-disciplinary care team collaboration (see longitudinal plan of care) . Comprehensive medication review performed; medication list updated in electronic medical record  Heart Failure / Hypertension    Type: Diastolic, grade 2 dysfunction Last ejection fraction: 60-65% (11/21/2016)  Patient checks BP at home several times per month Patient home BP readings are ranging: 113/48 - 140/56, HR 60s    Patient has failed these meds in past: isosorbide [place] Patient is currently controlled on the following medications:   Metoprolol succinate 25 mg - 1/2 tab daily  Furosemide 20 mg - PRN only for swelling   We discussed: previously cardiology had told pt to stop furosemide and KCl, and 1/2 metoprolol due to dizziness, mulltiple falls this year; she reports doing better since then; she was told to use furosemide only as needed for swelling, but she has not had to use it at all  Plan: Continue current medications   Hyperlipidemia    LDL goal < 100  Patient has failed these meds in past: atorvastatin (caused hospitalization) Patient is currently uncontrolled on the following medications:   Ezetimibe 10 mg daily AM   We discussed:  diet and exercise extensively; Cholesterol goals; benefits of ezetimibe for ASCVD risk reduction; pt reports high copay for ezetimibe - per formulary it is tier 3, so she has been using Good rx card to get it for $18 for 90 days. Pt cannot take statins since atorvastatin caused a hospitalization, may attempt tier exception for cheaper copay   Plan:  Continue current medications  Tier exception for Ezetimibe    Depression    Patient has failed these meds in past: none Patient is currently controlled on the following medications:  . Escitalopram 5 mg daily  We discussed:  Pt reports some  improvement in mood since starting medication  Plan: Continue current medications  Parkinson's Disease   Dx 07/2020 per neurology Patient has failed these meds in past: n/a Patient is currently controlled on the following medications:  . Carbidopa-levodopa IR 25-100 TID 7a/11a/4p  We discussed:  Pt reports she has not noticed much difference since starting levodopa; she reports tremor is better some days but worse other days; she does say her sleep is much worse since starting the med; advised her to discuss this with her neurologist at f/u in June  Plan: Continue current medications; discuss issues with Dr Tat   GERD    Patient has failed these meds in past: n/a Patient is currently controlled on the following medications:   Omeprazole 40 mg BID   We discussed:  Pt reports some breakthrough symptoms but her main issuing is swallowing, she follows with GI   Plan: Continue current medications   Osteoporosis    Last DEXA Scan: 11/05/20 - improved in spine/hips compared to 2019             T-Score femoral neck: -1.9             T-Score total hip: n/a             T-Score lumbar spine: -3.3             T-Score forearm radius: n/a    Patient is a candidate for pharmacologic treatment due to T-Score < -2.5 in lumbar spine   Patient has failed these meds in past: alendronate Patient is currently controlled on the following medications:   Prolia 60 mg q6 months (last given 09/14/20)  Vitamin D 1000 IU - 2 cap AM   We discussed:  Recommend [phone] units of vitamin D daily. Recommend 1200 mg of calcium daily from dietary and supplemental sources.   Plan: Continue current medications  Medication management  -Not ideally controlled - pt has 3 medications that are taken multiple times per day and have to be taken  before meals, she is not sure how to take them -Current treatment  . Carbidopa-Levodopa 25-100 mg TID (7am/11am/4pm) . Dicyclomine 10 mg TID w/ meals . Omeprazole 40 mg BID before meals -Counseled patient it is ok to take all medications together 30 min before meals. These medications do not interfere with absorption/action of each other and are safe to take together -Recommend to continue current medication  Patient Goals/Self-Care Activities . Patient will:  - take medications as prescribed focus on medication adherence by pill box -Discuss sleep issues with Dr Tat     Medication Assistance: None required.  Patient affirms current coverage meets needs.  Compliance/Adherence/Medication fill history: Care Gaps: TDAP (due 10/14/20)  Star-Rating Drugs: None  Patient's preferred pharmacy is:  [facility] - [place], [place] - [address] [place] [address] Phone: [phone] Fax: [phone]  Uses pill box? Yes Pt endorses 100% compliance  We discussed: Current pharmacy is preferred with insurance plan and patient is satisfied with pharmacy services Patient decided to: Continue current medication management strategy  Care Plan and Follow Up Patient Decision:  Patient agrees to Care Plan and Follow-up.  Plan: Telephone follow up appointment with care management team member scheduled for:  6 months  Charlene Brooke, PharmD, [place], CPP Clinical Pharmacist [facility] at [facility] [phone]

## 2020-12-19 NOTE — Patient Instructions (Signed)
Visit Information  Phone number for Pharmacist: [phone]  Goals Addressed            This Visit's Progress   . Manage My Medicine       Timeframe:  Long-Range Goal Priority:  Medium Start Date:     12/19/20                        Expected End Date: 12/19/21                      Follow Up Date Nov 2022   - call for medicine refill 2 or 3 days before it runs out - call if I am sick and can't take my medicine - keep a list of all the medicines I take; vitamins and herbals too - use a pillbox to sort medicine -Discuss sleep issues with Dr Carles Collet    Why is this important?   . These steps will help you keep on track with your medicines.   Notes:       Patient verbalizes understanding of instructions provided today and agrees to view in [place].  Telephone follow up appointment with pharmacy team member scheduled for: 6 months  Charlene Brooke, PharmD, [place], CPP Clinical Pharmacist [facility] at [facility] [phone]

## 2021-01-04 ENCOUNTER — Other Ambulatory Visit: Payer: Self-pay | Admitting: Gastroenterology

## 2021-01-04 ENCOUNTER — Telehealth: Payer: Self-pay | Admitting: Pharmacist

## 2021-01-04 ENCOUNTER — Other Ambulatory Visit: Payer: Self-pay | Admitting: Internal Medicine

## 2021-01-04 NOTE — Chronic Care Management (AMB) (Signed)
Chronic Care Management Pharmacy Assistant   Name: [name]  MRN: [id number] DOB: [birth year]   Reason for Encounter: Medication Review   Recent office visits:  None ID  Recent consult visits:  None ID  Hospital visits:  None in previous 6 months  Medications: Outpatient Encounter Medications as of 01/04/2021  Medication Sig   acetaminophen (TYLENOL) 500 MG tablet Take 500 mg by mouth 2 (two) times daily.   carbidopa-levodopa (SINEMET IR) 25-100 MG tablet Take 1 tablet by mouth 3 (three) times daily. 7am/11am/4pm   cholecalciferol (VITAMIN D) 1000 UNITS tablet Take 2,000 Units by mouth daily.    Cyanocobalamin 1000 MCG TBCR Take 1,000 mcg by mouth daily.    denosumab (PROLIA) 60 MG/ML SOSY injection Inject 60 mg into the skin every 6 (six) months.   Diclofenac Sodium (PENNSAID) 2 % SOLN Apply topically.   diclofenac Sodium (VOLTAREN) 1 % GEL Apply 2 g topically 4 (four) times daily.   dicyclomine (BENTYL) 10 MG capsule Take 1 capsule (10 mg total) by mouth 3 (three) times daily before meals.   escitalopram (LEXAPRO) 5 MG tablet TAKE ONE TABLET BY MOUTH ONCE DAILY   ezetimibe (ZETIA) 10 MG tablet Take 1 tablet (10 mg total) by mouth daily.   ferrous sulfate 325 (65 FE) MG tablet Take 325 mg by mouth 3 (three) times a week.   metoprolol succinate (TOPROL XL) 25 MG 24 hr tablet Take 0.5 tablets (12.5 mg total) by mouth daily.   omeprazole (PRILOSEC) 40 MG capsule TAKE ONE CAPSULE BY MOUTH EVERY MORNING and TAKE ONE CAPSULE BY MOUTH EVERY EVENING   Polyethyl Glycol-Propyl Glycol (LUBRICANT EYE DROPS) 0.4-0.3 % SOLN Place 1 drop into both eyes 3 (three) times daily as needed (dry/irritated eyes.).   polyethylene glycol (MIRALAX / GLYCOLAX) packet Take 17 g by mouth daily as needed (constipation).   sodium chloride (OCEAN) 0.65 % SOLN nasal spray Place 1 spray into both nostrils 4 (four) times daily as needed for congestion.   No facility-administered encounter medications  on file as of 01/04/2021.   Pharmacist Review  Reviewed chart for medication changes ahead of medication coordination call.  No OVs, Consults, or hospital visits since last care coordination call/Pharmacist visit.  No medication changes indicated   BP Readings from Last 3 Encounters:  12/12/20 130/62  11/27/20 (!) 142/56  11/22/20 (!) 128/49    Lab Results  Component Value Date   HGBA1C 5.9 (H) 02/03/2020     Patient obtains medications through Vials  90 Days   Last adherence delivery included:  Omeprazole 40 mg cap Take one capsule by mouth every morning and take one capsule by mouth every evening Escitalopram 5 mg tab Take one tab by mouth once daily Metoprolol Succinate 25 mg Take 1/2 by mouth everyday at bedtime    Patient is due for next adherence delivery on: 01/16/21. Called patient and reviewed medications and coordinated delivery.  This delivery to include: Omeprazole 40 mg cap Take one capsule by mouth every morning and take one capsule by mouth every evening Escitalopram 5 mg tab Take one tab by mouth once daily Metoprolol Succinate 25 mg Take 1/2 by mouth everyday at bedtime  Ezetimibie 10 mg 1 tab daily Carbidopa-levodopa 25-100 mg 1 tab 3 times daily   Patient needs refills for None ID.  Confirmed delivery date of 01/16/21, advised patient that pharmacy will contact them the morning of delivery.  [place] Pharmacist Assistant [phone]   Time spent:42

## 2021-01-09 NOTE — Progress Notes (Signed)
Assessment/Plan:   1.  Parkinsonism, with the differential still being between idiopathic PD and an atypical state (suspect atypical state given orthostasis, numerous early falls, particularly retropulsion, and orthostatic tremor).  Minor functional aspects with tremor today  -Patient did have evidence of orthostatic tremor in the office today.  I really do not want to add any medication for that, because the treatment is generally benzodiazepines, which can increase risk for falls.  Ultimately, I told the patient that I want her to stay seated and use her transport chair in the home.  -cannot do MRI brain due to PPM that is not MRI compatible.  Will do CT instead  -will try to do DaT scan.  Has tremor but its not a rest tremor but other features are parkinsonian.  -wrote PT RX today for home PT  2.  Orthostatic hypotension  -Following with cardiology.  Lasix has been discontinued.  Metoprolol has been cut in half.  They are considering midodrine.   Subjective:   [name] was seen today in follow up for newly diagnosed parkinsonism.  Patient with her husband who supplements the history.  Daughter on phone who supplements history.  Medical records reviewed since last visit, including therapy notes.  She has had no falls since last visit - they think that is mostly because someone is right there watching her.  She does not walk much because of orthostatic tremor.  Has seen cardiology regarding orthostasis.  Lasix has been stopped, metoprolol has been cut in half.  She states that BP generally not running low.  However, when she goes to lay down at night or get up in the AM, she will have some spinning.  That has only been the last 3 nights.  Occasionally trouble swallowing.  No diplopia.  Caregiver coming to the home 9am-11am (will be 9am-noon soon).  They have Long term care insurance and are looking for a 10 hour per day caregiver with 8 hours/week for hourself and 1 hour/week for  PT.  Current movement disorder medications: Carbidopa/levodopa 25/100, 1 tablet 3 times per day   ALLERGIES:   Allergies  Allergen Reactions   Statins     Lipitor caused hospitalization - - depletion of electrolytes, fever, nausea, loss of appetite, couldn't get out of bed   Cymbalta [Duloxetine Hcl] Other (See Comments)    Dulled her too much, difficulty urinating, change in vision   Fosamax [Alendronate Sodium] Other (See Comments)    Caused chronic issues swallowing   Amitriptyline     hallucinations   Lyrica [Pregabalin]     Unknown   Neurontin [Gabapentin] Other (See Comments)    Dizziness and sedation (patient is tolerating in lower dose)   Zanaflex [Tizanidine] Other (See Comments)    Could not sleep    CURRENT MEDICATIONS:  Outpatient Encounter Medications as of 01/10/2021  Medication Sig   acetaminophen (TYLENOL) 500 MG tablet Take 500 mg by mouth 2 (two) times daily.   carbidopa-levodopa (SINEMET IR) 25-100 MG tablet Take 1 tablet by mouth 3 (three) times daily. 7am/11am/4pm   cholecalciferol (VITAMIN D) 1000 UNITS tablet Take 2,000 Units by mouth daily.    Cyanocobalamin 1000 MCG TBCR Take 1,000 mcg by mouth daily.    denosumab (PROLIA) 60 MG/ML SOSY injection Inject 60 mg into the skin every 6 (six) months.   Diclofenac Sodium (PENNSAID) 2 % SOLN Apply topically.   diclofenac Sodium (VOLTAREN) 1 % GEL Apply 2 g topically 4 (four) times daily.  dicyclomine (BENTYL) 10 MG capsule Take 1 capsule (10 mg total) by mouth 3 (three) times daily before meals.   escitalopram (LEXAPRO) 5 MG tablet TAKE ONE TABLET BY MOUTH ONCE DAILY   ezetimibe (ZETIA) 10 MG tablet Take 1 tablet (10 mg total) by mouth daily.   ferrous sulfate 325 (65 FE) MG tablet Take 325 mg by mouth 3 (three) times a week.   metoprolol succinate (TOPROL XL) 25 MG 24 hr tablet Take 0.5 tablets (12.5 mg total) by mouth daily.   omeprazole (PRILOSEC) 40 MG capsule TAKE ONE CAPSULE BY MOUTH EVERY MORNING and  TAKE ONE CAPSULE BY MOUTH EVERY EVENING   Polyethyl Glycol-Propyl Glycol (LUBRICANT EYE DROPS) 0.4-0.3 % SOLN Place 1 drop into both eyes 3 (three) times daily as needed (dry/irritated eyes.).   polyethylene glycol (MIRALAX / GLYCOLAX) packet Take 17 g by mouth daily as needed (constipation).   sodium chloride (OCEAN) 0.65 % SOLN nasal spray Place 1 spray into both nostrils 4 (four) times daily as needed for congestion.   No facility-administered encounter medications on file as of 01/10/2021.    Objective:   PHYSICAL EXAMINATION:    VITALS:   Vitals:   01/10/21 1426  BP: (!) 112/52  Pulse: 65  Temp: 98.4 F (36.9 C)  SpO2: 96%  Weight: 155 lb (70.3 kg)  Height: 5\' 1"  (1.549 m)    No data found.   GEN:  The patient appears stated age and is in NAD. HEENT:  Normocephalic, atraumatic.  The mucous membranes are moist. The superficial temporal arteries are without ropiness or tenderness. CV:  RRR Lungs:  CTAB Neck/HEME:  There are no carotid bruits bilaterally.  Neurological examination:  Orientation: The patient is alert and oriented x3. Cranial nerves: There is good facial symmetry with facial hypomimia. The speech is fluent and clear. Soft palate rises symmetrically and there is no tongue deviation. Hearing is intact to conversational tone. Sensation: Sensation is intact to light touch throughout Motor: Strength is at least antigravity x4.  Movement examination: Tone: There is normal tone in the upper and lower extremities Abnormal movements: No tremor seen or felt today. Coordination:  There is no significant decremation with RAM's (this is an improvement) Gait and Station: The patient requires assist out of the chair.  Once up she has a flapping tremor in the arms and some tremor in the legs as well.  I have reviewed and interpreted the following labs independently    Chemistry      Component Value Date/Time   NA 135 10/05/2020 1150   K 4.9 10/05/2020 1150   CL  101 10/05/2020 1150   CO2 21 10/05/2020 1150   BUN 12 10/05/2020 1150   CREATININE 0.92 10/05/2020 1150   CREATININE 1.23 (H) 03/30/2020 1011      Component Value Date/Time   CALCIUM 8.5 (L) 10/05/2020 1150   ALKPHOS 35 (L) 12/27/2019 0810   AST 11 03/30/2020 1011   ALT 8 03/30/2020 1011   BILITOT 0.4 03/30/2020 1011       Lab Results  Component Value Date   WBC 6.2 10/05/2020   HGB 10.8 (L) 10/05/2020   HCT 31.5 (L) 10/05/2020   MCV 96 10/05/2020   PLT 320 10/05/2020    Lab Results  Component Value Date   TSH 1.85 02/03/2020     Total time spent on today's visit was 48 minutes, including both face-to-face time and nonface-to-face time.  Time included that spent on review of records (prior  notes available to me/labs/imaging if pertinent), discussing treatment and goals, answering patient's questions and coordinating care.  Cc:  [name], MD

## 2021-01-10 ENCOUNTER — Other Ambulatory Visit: Payer: Self-pay

## 2021-01-10 ENCOUNTER — Ambulatory Visit (INDEPENDENT_AMBULATORY_CARE_PROVIDER_SITE_OTHER): Payer: Medicare Other | Admitting: Neurology

## 2021-01-10 ENCOUNTER — Encounter: Payer: Self-pay | Admitting: Neurology

## 2021-01-10 VITALS — BP 112/52 | HR 65 | Temp 98.4°F | Ht 61.0 in | Wt 155.0 lb

## 2021-01-10 DIAGNOSIS — G20 Parkinson's disease: Secondary | ICD-10-CM | POA: Diagnosis not present

## 2021-01-10 DIAGNOSIS — R251 Tremor, unspecified: Secondary | ICD-10-CM | POA: Diagnosis not present

## 2021-01-10 DIAGNOSIS — R270 Ataxia, unspecified: Secondary | ICD-10-CM | POA: Diagnosis not present

## 2021-01-14 ENCOUNTER — Ambulatory Visit
Admission: RE | Admit: 2021-01-14 | Discharge: 2021-01-14 | Disposition: A | Discharge status: Home / Self Care | Payer: Medicare Other | Source: Ambulatory Visit | Attending: Neurology | Admitting: Neurology

## 2021-01-14 DIAGNOSIS — R270 Ataxia, unspecified: Secondary | ICD-10-CM | POA: Diagnosis not present

## 2021-01-14 NOTE — Progress Notes (Signed)
[facility] [address] [place] [facility] Phone: [phone] Subjective:   I [name] am serving as a Education administrator for Dr. Hulan Saas.  This visit occurred during the SARS-CoV-2 public health emergency.  Safety protocols were in place, including screening questions prior to the visit, additional usage of staff PPE, and extensive cleaning of exam room while observing appropriate contact time as indicated for disinfecting solutions.   I'm seeing this patient by the request  of:  [name], MD  CC: Knee pain and swelling  MCN:[id number]  [name] is a 86 y.o. female coming in with a past medical history significant for Parkinson's disease and ataxia complaint of knee pain and swelling. Last seen for LBP in 2021. Patient states the swelling has gotten better. Not able to walk so she has been sitting more often. Knee caps are painful. Knees are weak. Patient has parkinson and effects the legs when she tries to get up. States her legs are really shaky when she goes to stand up. Posterior knee pain as well. Laying down at night with the knees together causes pain as well. Sometimes she has tingling in the heels that radiate to the knee.   Xray R knee 07/2019 Negative  Other past medical history significant for left sided breast cancer and mastectomy.  Past Medical History:  Diagnosis Date   Allergy    Anemia    Cancer of left breast ([place]) 1978   CHF (congestive heart failure) ([place])    Chronic thoracic back pain    "T8; fracture; 03/2015; no OR" (05/05/2016)   GERD (gastroesophageal reflux disease)    Heart murmur    History of hiatal hernia    Hyperlipidemia    Hyperplastic colon polyp    Hypertension    IBS (irritable bowel syndrome)    Multiple thyroid nodules    Osteoporosis    T8 compression fx 03/2015    Personal history of radiation therapy    Presence of permanent cardiac pacemaker    Vitamin B12 deficiency    Past Surgical  History:  Procedure Laterality Date   ANTERIOR CERVICAL DECOMP/DISCECTOMY FUSION  2008   C5-6   AUGMENTATION MAMMAPLASTY     BACK SURGERY     BREAST SURGERY     CARDIAC CATHETERIZATION  2001   EP IMPLANTABLE DEVICE N/A 05/05/2016   Procedure: Pacemaker Implant;  Surgeon: Evans Lance, MD;  Location: [facility];  Service: Cardiovascular;  Laterality: N/A;   EXCISIONAL HEMORRHOIDECTOMY  1984   With subsequent correction of surgery   INSERT / REPLACE / REMOVE PACEMAKER     INTRAVASCULAR PRESSURE WIRE/FFR STUDY N/A 10/07/2019   Procedure: INTRAVASCULAR PRESSURE WIRE/FFR STUDY;  Surgeon: Jettie Booze, MD;  Location: [facility];  Service: Cardiovascular;  Laterality: N/A;   KNEE ARTHROSCOPY Left    "meniscus tear"   LEFT HEART CATH AND CORONARY ANGIOGRAPHY N/A 10/07/2019   Procedure: LEFT HEART CATH AND CORONARY ANGIOGRAPHY;  Surgeon: Jettie Booze, MD;  Location: [facility];  Service: Cardiovascular;  Laterality: N/A;   MASTECTOMY Left 1978   PLACEMENT OF BREAST IMPLANTS Left 1981   REDUCTION MAMMAPLASTY Right    SHOULDER ARTHROSCOPY W/ ROTATOR CUFF REPAIR Left 02/2006   "tear"   TUBAL LIGATION     Social History   Socioeconomic History   Marital status: Married    Spouse name: Not on file   Number of children: 2  Years of education: Not on file   Highest education level: Not on file  Occupational History   Occupation: retired  Tobacco Use   Smoking status: Never   Smokeless tobacco: Never  Vaping Use   Vaping Use: Never used  Substance and Sexual Activity   Alcohol use: No    Alcohol/week: 0.0 standard drinks   Drug use: No   Sexual activity: Not Currently  Other Topics Concern   Not on file  Social History Narrative   Housewife, Lives with spouse, 2 children   Social Determinants of Health   Financial Resource Strain: Medium Risk   Difficulty of Paying Living Expenses: Somewhat hard  Food Insecurity: Not on file   Transportation Needs: Not on file  Physical Activity: Not on file  Stress: Not on file  Social Connections: Not on file   Allergies  Allergen Reactions   Statins     Lipitor caused hospitalization - - depletion of electrolytes, fever, nausea, loss of appetite, couldn't get out of bed   Cymbalta [Duloxetine Hcl] Other (See Comments)    Dulled her too much, difficulty urinating, change in vision   Fosamax [Alendronate Sodium] Other (See Comments)    Caused chronic issues swallowing   Amitriptyline     hallucinations   Lyrica [Pregabalin]     Unknown   Neurontin [Gabapentin] Other (See Comments)    Dizziness and sedation (patient is tolerating in lower dose)   Zanaflex [Tizanidine] Other (See Comments)    Could not sleep   Family History  Problem Relation Age of Onset   Stroke Sister    Diabetes Brother    Breast cancer Maternal Aunt    Atrial fibrillation Sister    Colon cancer Neg Hx    Stomach cancer Neg Hx     Current Outpatient Medications (Endocrine & Metabolic):    denosumab (PROLIA) 60 MG/ML SOSY injection, Inject 60 mg into the skin every 6 (six) months.  Current Outpatient Medications (Cardiovascular):    ezetimibe (ZETIA) 10 MG tablet, Take 1 tablet (10 mg total) by mouth daily.   metoprolol succinate (TOPROL XL) 25 MG 24 hr tablet, Take 0.5 tablets (12.5 mg total) by mouth daily.  Current Outpatient Medications (Respiratory):    sodium chloride (OCEAN) 0.65 % SOLN nasal spray, Place 1 spray into both nostrils 4 (four) times daily as needed for congestion.  Current Outpatient Medications (Analgesics):    acetaminophen (TYLENOL) 500 MG tablet, Take 500 mg by mouth 2 (two) times daily.  Current Outpatient Medications (Hematological):    Cyanocobalamin 1000 MCG TBCR, Take 1,000 mcg by mouth daily.    ferrous sulfate 325 (65 FE) MG tablet, Take 325 mg by mouth 3 (three) times a week.  Current Outpatient Medications (Other):    carbidopa-levodopa (SINEMET IR)  25-100 MG tablet, Take 1 tablet by mouth 3 (three) times daily. 7am/11am/4pm   cholecalciferol (VITAMIN D) 1000 UNITS tablet, Take 2,000 Units by mouth daily.    Diclofenac Sodium (PENNSAID) 2 % SOLN, Apply topically.   diclofenac Sodium (VOLTAREN) 1 % GEL, Apply 2 g topically 4 (four) times daily.   dicyclomine (BENTYL) 10 MG capsule, Take 1 capsule (10 mg total) by mouth 3 (three) times daily before meals.   escitalopram (LEXAPRO) 5 MG tablet, TAKE ONE TABLET BY MOUTH ONCE DAILY   omeprazole (PRILOSEC) 40 MG capsule, TAKE ONE CAPSULE BY MOUTH EVERY MORNING and TAKE ONE CAPSULE BY MOUTH EVERY EVENING   Polyethyl Glycol-Propyl Glycol (LUBRICANT EYE DROPS) 0.4-0.3 % SOLN,  Place 1 drop into both eyes 3 (three) times daily as needed (dry/irritated eyes.).   polyethylene glycol (MIRALAX / GLYCOLAX) packet, Take 17 g by mouth daily as needed (constipation).   Reviewed prior external information including notes and imaging from  primary care provider As well as notes that were available from care everywhere and other healthcare systems.  Past medical history, social, surgical and family history all reviewed in electronic medical record.  No pertanent information unless stated regarding to the chief complaint.   Review of Systems:  No headache, visual changes, nausea, vomiting, diarrhea, constipation, dizziness, abdominal pain, skin rash, fevers, chills, night sweats, weight loss, swollen lymph nodes, chest pain, shortness of breath, mood changes. POSITIVE muscle aches and weakness.  Positive body aches.  As well as joint swelling  Objective  Blood pressure 140/72, pulse 60, height 5\' 1"  (1.549 m), SpO2 99 %.   General: No apparent distress alert and oriented x3 mood and affect normal, dressed appropriately.  HEENT: Pupils equal, extraocular movements intact  Respiratory: Patient's speak in full sentences and does not appear short of breath  Cardiovascular: No lower extremity edema, non tender, no  erythema  Patient is seated in a wheelchair.  Tenderness to palpation in the knees bilaterally.  Worsening pain on the popliteal area on the right side.  Patient does have crepitus in the knees bilaterally.  Mild hypertonicity noted.  Does have weakness when patient tries to stand.  Limited musculoskeletal ultrasound was performed and interpreted by Lyndal Pulley  Limited ultrasound of patient's knees bilaterally show moderate narrowing of the medial joint space as well as the patellofemoral joint space but no significant swelling noted. Impression: Knee arthritis bilaterally.  After informed written and verbal consent, patient was seated on exam table. Right knee was prepped with alcohol swab and utilizing anterolateral approach, patient's right knee space was injected with 4:1  marcaine 0.5%: Kenalog 40mg /dL. Patient tolerated the procedure well without immediate complications.  After informed written and verbal consent, patient was seated on exam table. Left knee was prepped with alcohol swab and utilizing anterolateral approach, patient's left knee space was injected with 4:1  marcaine 0.5%: Kenalog 40mg /dL. Patient tolerated the procedure well without immediate complications.   Impression and Recommendations:     The above documentation has been reviewed and is accurate and complete Lyndal Pulley, DO

## 2021-01-15 ENCOUNTER — Ambulatory Visit (INDEPENDENT_AMBULATORY_CARE_PROVIDER_SITE_OTHER): Payer: Medicare Other

## 2021-01-15 ENCOUNTER — Other Ambulatory Visit: Payer: Self-pay

## 2021-01-15 ENCOUNTER — Encounter: Payer: Self-pay | Admitting: Family Medicine

## 2021-01-15 ENCOUNTER — Ambulatory Visit (INDEPENDENT_AMBULATORY_CARE_PROVIDER_SITE_OTHER): Payer: Medicare Other | Admitting: Family Medicine

## 2021-01-15 ENCOUNTER — Ambulatory Visit: Payer: Self-pay

## 2021-01-15 VITALS — BP 140/72 | HR 60 | Ht 61.0 in

## 2021-01-15 DIAGNOSIS — I739 Peripheral vascular disease, unspecified: Secondary | ICD-10-CM

## 2021-01-15 DIAGNOSIS — M170 Bilateral primary osteoarthritis of knee: Secondary | ICD-10-CM | POA: Diagnosis not present

## 2021-01-15 DIAGNOSIS — G8929 Other chronic pain: Secondary | ICD-10-CM

## 2021-01-15 DIAGNOSIS — M25562 Pain in left knee: Secondary | ICD-10-CM | POA: Diagnosis not present

## 2021-01-15 DIAGNOSIS — M25561 Pain in right knee: Secondary | ICD-10-CM

## 2021-01-15 DIAGNOSIS — M2550 Pain in unspecified joint: Secondary | ICD-10-CM

## 2021-01-15 DIAGNOSIS — R29898 Other symptoms and signs involving the musculoskeletal system: Secondary | ICD-10-CM

## 2021-01-15 DIAGNOSIS — E538 Deficiency of other specified B group vitamins: Secondary | ICD-10-CM | POA: Diagnosis not present

## 2021-01-15 LAB — COMPREHENSIVE METABOLIC PANEL
ALT: 4 U/L (ref 0–35)
AST: 12 U/L (ref 0–37)
Albumin: 4.3 g/dL (ref 3.5–5.2)
Alkaline Phosphatase: 34 U/L — ABNORMAL LOW (ref 39–117)
BUN: 9 mg/dL (ref 6–23)
CO2: 25 mEq/L (ref 19–32)
Calcium: 8.7 mg/dL (ref 8.4–10.5)
Chloride: 102 mEq/L (ref 96–112)
Creatinine, Ser: 0.87 mg/dL (ref 0.40–1.20)
GFR: 61.48 mL/min (ref >60.00)
Glucose, Bld: 100 mg/dL — ABNORMAL HIGH (ref 70–99)
Potassium: 4.2 mEq/L (ref 3.5–5.1)
Sodium: 134 mEq/L — ABNORMAL LOW (ref 135–145)
Total Bilirubin: 0.3 mg/dL (ref 0.2–1.2)
Total Protein: 6.8 g/dL (ref 6.0–8.3)

## 2021-01-15 LAB — CBC WITH DIFFERENTIAL/PLATELET
Basophils Absolute: 0 10*3/uL (ref 0.0–0.1)
Basophils Relative: 0.6 % (ref 0.0–3.0)
Eosinophils Absolute: 0.1 10*3/uL (ref 0.0–0.7)
Eosinophils Relative: 2 % (ref 0.0–5.0)
HCT: 35.4 % — ABNORMAL LOW (ref 36.0–46.0)
Hemoglobin: 12 g/dL (ref 12.0–15.0)
Lymphocytes Relative: 25.5 % (ref 12.0–46.0)
Lymphs Abs: 1.7 10*3/uL (ref 0.7–4.0)
MCHC: 33.8 g/dL (ref 30.0–36.0)
MCV: 96.3 fl (ref 78.0–100.0)
Monocytes Absolute: 0.8 10*3/uL (ref 0.1–1.0)
Monocytes Relative: 11.6 % (ref 3.0–12.0)
Neutro Abs: 4.1 10*3/uL (ref 1.4–7.7)
Neutrophils Relative %: 60.3 % (ref 43.0–77.0)
Platelets: 308 10*3/uL (ref 150.0–400.0)
RBC: 3.67 Mil/uL — ABNORMAL LOW (ref 3.87–5.11)
RDW: 13.5 % (ref 11.5–15.5)
WBC: 6.7 10*3/uL (ref 4.0–10.5)

## 2021-01-15 LAB — IBC PANEL
Iron: 50 ug/dL (ref 42–145)
Saturation Ratios: 13.7 % — ABNORMAL LOW (ref 20.0–50.0)
Transferrin: 261 mg/dL (ref 212.0–360.0)

## 2021-01-15 LAB — FERRITIN: Ferritin: 27.9 ng/mL (ref 10.0–291.0)

## 2021-01-15 LAB — SEDIMENTATION RATE: Sed Rate: 10 mm/hr (ref 0–30)

## 2021-01-15 LAB — VITAMIN B12: Vitamin B-12: 1074 pg/mL — ABNORMAL HIGH (ref 211–911)

## 2021-01-15 NOTE — Assessment & Plan Note (Signed)
Patient has had difficulty with claudication in the calf previously.  We will get a D-dimer today with his not being able to get a Doppler ultrasound in the near future.  If normal we will continue to monitor.  If positive will need to consider the possibility of seeing if we can speed this up.  Has had difficulty in the knees previously as well.  We will continue to see the other providers including primary care and neurology.  Do think the knees are secondary to arthritic changes and will see how patient responds.  Differential also includes lumbar radiculopathy and may need to consider the possibility of further work-up again.  Patient has responded to epidurals previously and may consider this as well.  Patient will follow up with me again in 6 to 8 weeks

## 2021-01-15 NOTE — Patient Instructions (Addendum)
Good to see you Knee injections today Xray today [facility]  ([facility] [employer] in [facility]) [address] [place], [place] [address] [phone] Labs today See me again in 6-8 weeks

## 2021-01-15 NOTE — Assessment & Plan Note (Signed)
Bilateral injections given today, tolerated the procedure well.  Discussed icing regimen and home exercises.  Patient has had significant other comorbidities recently and continues to have significant other health issues.  We will see how patient responds overall with the injection.  Patient could be a candidate for potential viscosupplementation.  Patient also given a brace.  Once again discussed the potential for lumbar radiculopathy with patient's history as well as needing further evaluation with patient's other comorbidities.  Awaiting D-dimer and consider Dopplers which is scheduled already for July 6 but no sooner.  Patient as well as husband knows if any worsening swelling, shortness of breath or chest pain to seek medical attention immediately in the emergency department.  They verbalized understanding.  Follow-up with me otherwise in 6 weeks

## 2021-01-16 LAB — D-DIMER, QUANTITATIVE: D-Dimer, Quant: 0.41 mcg/mL FEU (ref <0.50)

## 2021-01-17 ENCOUNTER — Other Ambulatory Visit: Payer: Self-pay

## 2021-01-17 ENCOUNTER — Ambulatory Visit (HOSPITAL_COMMUNITY)
Admission: RE | Admit: 2021-01-17 | Discharge: 2021-01-17 | Disposition: A | Discharge status: Home / Self Care | Payer: Medicare Other | Source: Ambulatory Visit | Attending: Family Medicine | Admitting: Family Medicine

## 2021-01-17 DIAGNOSIS — M25562 Pain in left knee: Secondary | ICD-10-CM

## 2021-01-17 DIAGNOSIS — G8929 Other chronic pain: Secondary | ICD-10-CM | POA: Diagnosis not present

## 2021-01-17 DIAGNOSIS — M25561 Pain in right knee: Secondary | ICD-10-CM

## 2021-01-18 ENCOUNTER — Ambulatory Visit (INDEPENDENT_AMBULATORY_CARE_PROVIDER_SITE_OTHER): Payer: Medicare Other | Admitting: Internal Medicine

## 2021-01-18 ENCOUNTER — Encounter: Payer: Self-pay | Admitting: Internal Medicine

## 2021-01-18 VITALS — BP 142/60 | HR 60 | Temp 98.7°F | Resp 18 | Ht 61.0 in | Wt 153.0 lb

## 2021-01-18 DIAGNOSIS — F419 Anxiety disorder, unspecified: Secondary | ICD-10-CM | POA: Diagnosis not present

## 2021-01-18 DIAGNOSIS — I10 Essential (primary) hypertension: Secondary | ICD-10-CM | POA: Diagnosis not present

## 2021-01-18 DIAGNOSIS — G20 Parkinson's disease: Secondary | ICD-10-CM | POA: Diagnosis not present

## 2021-01-18 DIAGNOSIS — R7303 Prediabetes: Secondary | ICD-10-CM | POA: Diagnosis not present

## 2021-01-18 MED ORDER — BUSPIRONE HCL 10 MG PO TABS: 10.0000 mg | ORAL_TABLET | Freq: Three times a day (TID) | ORAL | 1 refills | Status: DC | PRN

## 2021-01-18 NOTE — Assessment & Plan Note (Signed)
Pt wary of side effects and we need to avoid risk or benzo at her age; ok for trial buspar 10 tid prn,  to f/u any worsening symptoms or concerns

## 2021-01-18 NOTE — Assessment & Plan Note (Signed)
Mild very recent uncontrolled, but high risk for falls and low BP given PD and very recently had meds reduced; I hesitate to increased antiHTN med again given this, pt encouraged to continue to monitor BP at home as she is doing, and tx anxiety - see other note

## 2021-01-18 NOTE — Patient Instructions (Signed)
Please take all new medication as prescribed - the buspar up to three times per day as needed for nerves  Please continue all other medications as before, and refills have been done if requested.  Please have the pharmacy call with any other refills you may need.  Please keep your appointments with your specialists as you may have planned - Dr Tat and the testing for possible parkinsons, and Dr Lovena Le for BP

## 2021-01-18 NOTE — Assessment & Plan Note (Signed)
Lab Results  Component Value Date   HGBA1C 5.9 (H) 02/03/2020   Stable, pt to continue current medical treatment - diet

## 2021-01-18 NOTE — Progress Notes (Signed)
Patient ID: [name], female   DOB: 02-Dec-1934, 87 y.o.   MRN: [id number]        Chief Complaint: follow up HTN       HPI:  [name] is a 86 y.o. female here with increased stress recently and several days of increased BP over baseline; has documented home BP from June 15 normal, but gradualy increased to sbp 169-173 in last 3 days; became concerned and came here. Currently with being evaluated for possible PD per Dr Tat, and finds this stressful.  Also approx 4 wks ago lasix and k stopped, and metoprol ER 25 mg reduced per cardiology to 12.5 mg to take in the evening.  Has had 5 recent falls in last 5 months.  Denies worsening depressive symptoms, suicidal ideation, or panic; has ongoing anxiety now worsening.   Pt denies polydipsia, polyuria, or new focal neuro s/s.    Wt Readings from Last 3 Encounters:  01/18/21 153 lb (69.4 kg)  01/10/21 155 lb (70.3 kg)  12/12/20 156 lb (70.8 kg)   BP Readings from Last 3 Encounters:  01/18/21 (!) 142/60  01/15/21 140/72  01/10/21 (!) 112/52         Past Medical History:  Diagnosis Date   Allergy    Anemia    Cancer of left breast ([place]) 1978   CHF (congestive heart failure) (HCC)    Chronic thoracic back pain    "T8; fracture; 03/2015; no OR" (05/05/2016)   GERD (gastroesophageal reflux disease)    Heart murmur    History of hiatal hernia    Hyperlipidemia    Hyperplastic colon polyp    Hypertension    IBS (irritable bowel syndrome)    Multiple thyroid nodules    Osteoporosis    T8 compression fx 03/2015    Personal history of radiation therapy    Presence of permanent cardiac pacemaker    Vitamin B12 deficiency    Past Surgical History:  Procedure Laterality Date   ANTERIOR CERVICAL DECOMP/DISCECTOMY FUSION  2008   C5-6   AUGMENTATION MAMMAPLASTY     BACK SURGERY     BREAST SURGERY     CARDIAC CATHETERIZATION  2001   EP IMPLANTABLE DEVICE N/A 05/05/2016   Procedure: Pacemaker Implant;  Surgeon: Evans Lance, MD;   Location: [facility];  Service: Cardiovascular;  Laterality: N/A;   EXCISIONAL HEMORRHOIDECTOMY  1984   With subsequent correction of surgery   INSERT / REPLACE / REMOVE PACEMAKER     INTRAVASCULAR PRESSURE WIRE/FFR STUDY N/A 10/07/2019   Procedure: INTRAVASCULAR PRESSURE WIRE/FFR STUDY;  Surgeon: Jettie Booze, MD;  Location: [facility];  Service: Cardiovascular;  Laterality: N/A;   KNEE ARTHROSCOPY Left    "meniscus tear"   LEFT HEART CATH AND CORONARY ANGIOGRAPHY N/A 10/07/2019   Procedure: LEFT HEART CATH AND CORONARY ANGIOGRAPHY;  Surgeon: Jettie Booze, MD;  Location: [facility];  Service: Cardiovascular;  Laterality: N/A;   MASTECTOMY Left 1978   PLACEMENT OF BREAST IMPLANTS Left 1981   REDUCTION MAMMAPLASTY Right    SHOULDER ARTHROSCOPY W/ ROTATOR CUFF REPAIR Left 02/2006   "tear"   TUBAL LIGATION      reports that she has never smoked. She has never used smokeless tobacco. She reports that she does not drink alcohol and does not use drugs. family history includes Atrial fibrillation in her sister; Breast cancer in her maternal aunt; Diabetes in her brother; Stroke in her sister. Allergies  Allergen Reactions   Statins     Lipitor caused hospitalization - - depletion of electrolytes, fever, nausea, loss of appetite, couldn't get out of bed   Cymbalta [Duloxetine Hcl] Other (See Comments)    Dulled her too much, difficulty urinating, change in vision   Fosamax [Alendronate Sodium] Other (See Comments)    Caused chronic issues swallowing   Amitriptyline     hallucinations   Lyrica [Pregabalin]     Unknown   Neurontin [Gabapentin] Other (See Comments)    Dizziness and sedation (patient is tolerating in lower dose)   Zanaflex [Tizanidine] Other (See Comments)    Could not sleep   Current Outpatient Medications on File Prior to Visit  Medication Sig Dispense Refill   acetaminophen (TYLENOL) 500 MG tablet Take 500 mg by mouth 2 (two) times  daily.     carbidopa-levodopa (SINEMET IR) 25-100 MG tablet Take 1 tablet by mouth 3 (three) times daily. 7am/11am/4pm 270 tablet 1   cholecalciferol (VITAMIN D) 1000 UNITS tablet Take 2,000 Units by mouth daily.      Cyanocobalamin 1000 MCG TBCR Take 1,000 mcg by mouth daily.      denosumab (PROLIA) 60 MG/ML SOSY injection Inject 60 mg into the skin every 6 (six) months.     Diclofenac Sodium (PENNSAID) 2 % SOLN Apply topically.     diclofenac Sodium (VOLTAREN) 1 % GEL Apply 2 g topically 4 (four) times daily.     dicyclomine (BENTYL) 10 MG capsule Take 1 capsule (10 mg total) by mouth 3 (three) times daily before meals. 90 capsule 11   escitalopram (LEXAPRO) 5 MG tablet TAKE ONE TABLET BY MOUTH ONCE DAILY 30 tablet 5   ezetimibe (ZETIA) 10 MG tablet Take 1 tablet (10 mg total) by mouth daily. 90 tablet 3   ferrous sulfate 325 (65 FE) MG tablet Take 325 mg by mouth 3 (three) times a week.     metoprolol succinate (TOPROL XL) 25 MG 24 hr tablet Take 0.5 tablets (12.5 mg total) by mouth daily. 45 tablet 90   omeprazole (PRILOSEC) 40 MG capsule TAKE ONE CAPSULE BY MOUTH EVERY MORNING and TAKE ONE CAPSULE BY MOUTH EVERY EVENING 60 capsule 7   Polyethyl Glycol-Propyl Glycol (LUBRICANT EYE DROPS) 0.4-0.3 % SOLN Place 1 drop into both eyes 3 (three) times daily as needed (dry/irritated eyes.).     polyethylene glycol (MIRALAX / GLYCOLAX) packet Take 17 g by mouth daily as needed (constipation).     sodium chloride (OCEAN) 0.65 % SOLN nasal spray Place 1 spray into both nostrils 4 (four) times daily as needed for congestion.     No current facility-administered medications on file prior to visit.        ROS:  All others reviewed and negative.  Objective        PE:  BP (!) 142/60   Pulse 60   Temp 98.7 F (37.1 C) (Oral)   Resp 18   Ht 5\' 1"  (1.549 m)   Wt 153 lb (69.4 kg)   SpO2 98%   BMI 28.91 kg/m                 Constitutional: Pt appears in NAD in wheelchair               HENT: Head:  NCAT.                Right Ear: External ear normal.  Left Ear: External ear normal.                Eyes: . Pupils are equal, round, and reactive to light. Conjunctivae and EOM are normal               Nose: without d/c or deformity               Neck: Neck supple. Gross normal ROM               Cardiovascular: Normal rate and regular rhythm.                 Pulmonary/Chest: Effort normal and breath sounds without rales or wheezing.                Abd:  Soft, NT, ND, + BS, no organomegaly               Neurological: Pt is alert. At baseline orientation               Skin: Skin is warm. No rashes, no other new lesions, LE edema - none               Psychiatric: Pt behavior is normal without agitation   Micro: none  Cardiac tracings I have personally interpreted today:  none  Pertinent Radiological findings (summarize): none   Lab Results  Component Value Date   WBC 6.7 01/15/2021   HGB 12.0 01/15/2021   HCT 35.4 (L) 01/15/2021   PLT 308.0 01/15/2021   GLUCOSE 100 (H) 01/15/2021   CHOL 215 (H) 02/03/2020   TRIG 133 02/03/2020   HDL 58 02/03/2020   LDLDIRECT 164.6 12/13/2012   LDLCALC 132 (H) 02/03/2020   ALT 4 01/15/2021   AST 12 01/15/2021   NA 134 (L) 01/15/2021   K 4.2 01/15/2021   CL 102 01/15/2021   CREATININE 0.87 01/15/2021   BUN 9 01/15/2021   CO2 25 01/15/2021   TSH 1.85 02/03/2020   HGBA1C 5.9 (H) 02/03/2020   Assessment/Plan:  [name] is a 86 y.o. White or Caucasian [1] female with  has a past medical history of Allergy, Anemia, Cancer of left breast ([place]) (1978), CHF (congestive heart failure) ([place]), Chronic thoracic back pain, GERD (gastroesophageal reflux disease), Heart murmur, History of hiatal hernia, Hyperlipidemia, Hyperplastic colon polyp, Hypertension, IBS (irritable bowel syndrome), Multiple thyroid nodules, Osteoporosis, Personal history of radiation therapy, Presence of permanent cardiac pacemaker, and Vitamin B12  deficiency.  Hypertension Mild very recent uncontrolled, but high risk for falls and low BP given PD and very recently had meds reduced; I hesitate to increased antiHTN med again given this, pt encouraged to continue to monitor BP at home as she is doing, and tx anxiety - see other note  Anxiety Pt wary of side effects and we need to avoid risk or benzo at her age; ok for trial buspar 10 tid prn,  to f/u any worsening symptoms or concerns  Prediabetes Lab Results  Component Value Date   HGBA1C 5.9 (H) 02/03/2020   Stable, pt to continue current medical treatment - diet  Followup: Return if symptoms worsen or fail to improve.  Cathlean Cower, MD 01/18/2021 9:07 PM [facility]

## 2021-01-21 ENCOUNTER — Telehealth: Payer: Self-pay | Admitting: Neurology

## 2021-01-21 NOTE — Telephone Encounter (Signed)
Pt's daughter called in stating the physical therapy place she was referred to is not set up yet to bill insurance. They need a referral to another place that can bill her insurance.

## 2021-01-22 ENCOUNTER — Other Ambulatory Visit: Payer: Self-pay

## 2021-01-22 DIAGNOSIS — R251 Tremor, unspecified: Secondary | ICD-10-CM

## 2021-01-22 DIAGNOSIS — G20 Parkinson's disease: Secondary | ICD-10-CM

## 2021-01-22 NOTE — Telephone Encounter (Signed)
Referral has been sent to [facility]

## 2021-01-24 DIAGNOSIS — Z48817 Encounter for surgical aftercare following surgery on the skin and subcutaneous tissue: Secondary | ICD-10-CM | POA: Diagnosis not present

## 2021-01-24 DIAGNOSIS — Z85828 Personal history of other malignant neoplasm of skin: Secondary | ICD-10-CM | POA: Diagnosis not present

## 2021-01-25 ENCOUNTER — Telehealth (HOSPITAL_COMMUNITY): Payer: Self-pay | Admitting: Family Medicine

## 2021-01-25 ENCOUNTER — Ambulatory Visit: Payer: Self-pay

## 2021-01-25 ENCOUNTER — Other Ambulatory Visit (HOSPITAL_COMMUNITY): Payer: Self-pay | Admitting: Family Medicine

## 2021-01-25 DIAGNOSIS — M7989 Other specified soft tissue disorders: Secondary | ICD-10-CM

## 2021-01-25 DIAGNOSIS — M79669 Pain in unspecified lower leg: Secondary | ICD-10-CM

## 2021-01-25 NOTE — Chronic Care Management (AMB) (Signed)
  Care Management  Care Management Phone Note  01/25/2021 Name: [name] MRN: [id number] DOB: 1935-04-23 [name] is a 86 y.o. year old female who is a primary care patient of Burns, Claudina Lick, MD.   The Care Management team was consulted to assess patient's needs after disconnection with services provided by [facility].  Successful outreach was made by telephone today to assess patient care needs post discontinuation of [facility] Services.     Plan: The patient expressed ongoing care management/care coordination service needs and will be contacted by the scheduling team for an appointment with the [facility].    Tomasa Rand, RN, BSN, CEN [facility] RN Case Manager [phone]

## 2021-01-25 NOTE — Telephone Encounter (Signed)
error 

## 2021-01-29 ENCOUNTER — Ambulatory Visit (INDEPENDENT_AMBULATORY_CARE_PROVIDER_SITE_OTHER): Payer: Medicare Other

## 2021-01-29 ENCOUNTER — Other Ambulatory Visit: Payer: Self-pay | Admitting: Internal Medicine

## 2021-01-29 ENCOUNTER — Telehealth: Payer: Self-pay | Admitting: Family Medicine

## 2021-01-29 DIAGNOSIS — I495 Sick sinus syndrome: Secondary | ICD-10-CM

## 2021-01-29 DIAGNOSIS — Z1231 Encounter for screening mammogram for malignant neoplasm of breast: Secondary | ICD-10-CM

## 2021-01-29 NOTE — Telephone Encounter (Signed)
Left message for patient to call back  

## 2021-01-29 NOTE — Telephone Encounter (Signed)
Patient called and left a message over the weekend. She said that she saw where she was scheduled for a LE Venous DVT tomorrow (01/30/2021) but just had one done on 01/17/2021. She wanted to know if this was the same one and if so, she would like to cancel it.  Please advise.

## 2021-01-29 NOTE — Telephone Encounter (Signed)
Spoke to patient. Appointment has been canceled.

## 2021-01-30 ENCOUNTER — Ambulatory Visit (HOSPITAL_COMMUNITY)
Admission: RE | Admit: 2021-01-30 | Payer: Medicare Other | Source: Ambulatory Visit | Attending: Family Medicine | Admitting: Family Medicine

## 2021-01-30 LAB — CUP PACEART REMOTE DEVICE CHECK
Battery Remaining Longevity: 114 mo
Battery Remaining Percentage: 100 %
Brady Statistic RA Percent Paced: 59 %
Brady Statistic RV Percent Paced: 0 %
Date Time Interrogation Session: 20220705030100
Implantable Lead Implant Date: 20171009
Implantable Lead Implant Date: 20171009
Implantable Lead Location: 753859
Implantable Lead Location: 753860
Implantable Lead Model: 5076
Implantable Lead Model: 7741
Implantable Lead Serial Number: 785430
Implantable Pulse Generator Implant Date: 20171009
Lead Channel Impedance Value: 1857 Ohm
Lead Channel Impedance Value: 393 Ohm
Lead Channel Pacing Threshold Amplitude: 2.1 V
Lead Channel Pacing Threshold Pulse Width: 0.4 ms
Lead Channel Setting Pacing Amplitude: 2 V
Lead Channel Setting Pacing Amplitude: 2.5 V
Lead Channel Setting Pacing Pulse Width: 0.4 ms
Lead Channel Setting Sensing Sensitivity: 2.5 mV
Pulse Gen Serial Number: 766467

## 2021-01-31 DIAGNOSIS — F419 Anxiety disorder, unspecified: Secondary | ICD-10-CM | POA: Diagnosis not present

## 2021-01-31 DIAGNOSIS — Z20822 Contact with and (suspected) exposure to covid-19: Secondary | ICD-10-CM | POA: Diagnosis not present

## 2021-02-05 ENCOUNTER — Encounter (HOSPITAL_COMMUNITY)
Admission: RE | Admit: 2021-02-05 | Discharge: 2021-02-05 | Disposition: A | Discharge status: Home / Self Care | Payer: Medicare Other | Source: Ambulatory Visit | Attending: Neurology | Admitting: Neurology

## 2021-02-05 ENCOUNTER — Ambulatory Visit (HOSPITAL_COMMUNITY)
Admission: RE | Admit: 2021-02-05 | Discharge: 2021-02-05 | Disposition: A | Discharge status: Home / Self Care | Payer: Medicare Other | Source: Ambulatory Visit | Attending: Neurology | Admitting: Neurology

## 2021-02-05 ENCOUNTER — Other Ambulatory Visit: Payer: Self-pay

## 2021-02-05 DIAGNOSIS — R251 Tremor, unspecified: Secondary | ICD-10-CM | POA: Diagnosis not present

## 2021-02-05 DIAGNOSIS — G20 Parkinson's disease: Secondary | ICD-10-CM | POA: Diagnosis not present

## 2021-02-05 DIAGNOSIS — R270 Ataxia, unspecified: Secondary | ICD-10-CM | POA: Insufficient documentation

## 2021-02-05 DIAGNOSIS — R413 Other amnesia: Secondary | ICD-10-CM | POA: Diagnosis not present

## 2021-02-05 MED ORDER — POTASSIUM IODIDE (ANTIDOTE) 130 MG PO TABS
ORAL_TABLET | ORAL | Status: AC
  Filled 2021-02-05: qty 1

## 2021-02-05 MED ORDER — POTASSIUM IODIDE (ANTIDOTE) 130 MG PO TABS
130.0000 mg | ORAL_TABLET | Freq: Once | ORAL | Status: AC
  Administered 2021-02-05: 130 mg via ORAL

## 2021-02-05 MED ORDER — IOFLUPANE I 123 185 MBQ/2.5ML IV SOLN
4.6000 | Freq: Once | INTRAVENOUS | Status: AC | PRN
  Administered 2021-02-05: 4.6 via INTRAVENOUS
  Filled 2021-02-05: qty 5

## 2021-02-06 ENCOUNTER — Telehealth: Payer: Self-pay

## 2021-02-06 NOTE — Telephone Encounter (Signed)
-----   Message from [name] Berthold, DO sent at 02/05/2021  4:23 PM EDT ----- Please inform pt that her DaT scan is consistent with Parkinson's disease.  She will need a follow-up with Dr. Carles Collet.

## 2021-02-06 NOTE — Telephone Encounter (Signed)
-----   Message from Alda Berthold, DO sent at 02/05/2021  4:23 PM EDT ----- Please inform pt that her DaT scan is consistent with Parkinson's disease.  She will need a follow-up with Dr. Carles Collet.

## 2021-02-06 NOTE — Telephone Encounter (Signed)
Called patient and informed her of Dat Scan results. Patient verbalized understanding and had no further questions or concerns.   Informed patient that I would have someone call to schedule her a f/u. Reviewed last note and I don't see a recommended f/u time frame after DatScan. Will confirm with Dr. Carles Collet

## 2021-02-07 DIAGNOSIS — F419 Anxiety disorder, unspecified: Secondary | ICD-10-CM | POA: Diagnosis not present

## 2021-02-08 ENCOUNTER — Other Ambulatory Visit: Payer: Self-pay | Admitting: Neurology

## 2021-02-08 NOTE — Telephone Encounter (Signed)
Called patient and informed her of Dr.Tat's recommendations for f/u in 4 months. Patient was advised to Increase carbidopa/levodopa 25/100 to 1.5 three times per day. Patient is aware that front staff will call to get her scheduled for a f/u.

## 2021-02-08 NOTE — Telephone Encounter (Signed)
Patient is now scheduled for 02/11/21 for follow up, from the wait list.

## 2021-02-09 NOTE — Progress Notes (Signed)
Assessment/Plan:   1.  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  -DaT scan positive, with decreased uptake in bilateral putamen  -The patient will have a repeat MBE due to dysphagia.  -We will increase carbidopa/levodopa 25/100 to 2 po tid  -PT to start again this week (therapist was on vacation)  -discussed that patients with atypical states often become WC bound b/c of falls.  We discussed motorized WC.  They want to decide on more permanent long term living situation before making this decision.  -some functional aspects to examination.  Counseling could be of value, along with PT.  They will think about it   2.  Orthostatic hypotension  -improved.  Discussed concept of permissive hypertension  -Following with cardiology.  Lasix has been discontinued.  Metoprolol has been cut in half.    3.  GAD/depression  -on buspar  -increase lexapro, 10 mg daily  4.  Confusion  -discussed neurocog testing and will schedule  -wonder if #3 contributes   Subjective:   [name] was seen today in follow up for newly diagnosed parkinsonism.  Patient with her husband who supplements the history.  Daughter on phone who supplements history.  Pt had DaT scan last week and I personally reviewed it.  There was decreased uptake in the bilateral putamen.  Just Friday, they were told to increase carbidopa/levodopa 25/100 to 1.5 tabs tid (because this appointment wasn't made/anticipated yet and wanted to start increasing med).  Husband had trouble cutting the medication.  She is having trouble getting out of the chair without the use of the husband.  Knees seems to "buckle" on her.  No diplopia.  "My swallow is terrible."  Trouble taking her pills down.  Last MBE was 02/2020 and recommended dysphagia 3 (mech soft) with thin liquid.  Pt  worries about her cognition and husband noting some confusion.  Current movement disorder medications: Carbidopa/levodopa 25/100, 1 tablet 3 times per day   ALLERGIES:   Allergies  Allergen Reactions   Statins     Lipitor caused hospitalization - - depletion of electrolytes, fever, nausea, loss of appetite, couldn't get out of bed   Cymbalta [Duloxetine Hcl] Other (See Comments)    Dulled her too much, difficulty urinating, change in vision   Fosamax [Alendronate Sodium] Other (See Comments)    Caused chronic issues swallowing   Amitriptyline     hallucinations   Lyrica [Pregabalin]     Unknown   Neurontin [Gabapentin] Other (See Comments)    Dizziness and sedation (patient is tolerating in lower dose)   Zanaflex [Tizanidine] Other (See Comments)    Could not sleep    CURRENT MEDICATIONS:  Outpatient Encounter Medications as of 02/11/2021  Medication Sig   acetaminophen (TYLENOL) 500 MG tablet Take 500 mg by mouth 2 (two) times daily.   busPIRone (BUSPAR) 10 MG tablet Take 1 tablet (10 mg total) by mouth 3 (three) times daily as needed.   carbidopa-levodopa (SINEMET IR) 25-100 MG tablet TAKE ONE TABLET BY MOUTH THREE TIMES DAILY 7am/11am/4pm (Patient taking differently: 1.5 tablets. Take 1 and a half tablets 3 times daily.)   cholecalciferol (VITAMIN D) 1000 UNITS tablet Take 2,000 Units by mouth daily.    Cyanocobalamin 1000 MCG TBCR Take 1,000 mcg by mouth daily.    denosumab (PROLIA)  60 MG/ML SOSY injection Inject 60 mg into the skin every 6 (six) months.   Diclofenac Sodium (PENNSAID) 2 % SOLN Apply topically.   diclofenac Sodium (VOLTAREN) 1 % GEL Apply 2 g topically 4 (four) times daily.   dicyclomine (BENTYL) 10 MG capsule Take 1 capsule (10 mg total) by mouth 3 (three) times daily before meals.   escitalopram (LEXAPRO) 5 MG tablet TAKE ONE TABLET BY MOUTH ONCE DAILY   ezetimibe (ZETIA) 10 MG tablet Take 1 tablet (10 mg total) by mouth daily.   ferrous sulfate 325 (65 FE)  MG tablet Take 325 mg by mouth 3 (three) times a week.   metoprolol succinate (TOPROL XL) 25 MG 24 hr tablet Take 0.5 tablets (12.5 mg total) by mouth daily.   omeprazole (PRILOSEC) 40 MG capsule TAKE ONE CAPSULE BY MOUTH EVERY MORNING and TAKE ONE CAPSULE BY MOUTH EVERY EVENING   Polyethyl Glycol-Propyl Glycol (LUBRICANT EYE DROPS) 0.4-0.3 % SOLN Place 1 drop into both eyes 3 (three) times daily as needed (dry/irritated eyes.).   polyethylene glycol (MIRALAX / GLYCOLAX) packet Take 17 g by mouth daily as needed (constipation).   sodium chloride (OCEAN) 0.65 % SOLN nasal spray Place 1 spray into both nostrils 4 (four) times daily as needed for congestion.   [DISCONTINUED] potassium iodide (IOSAT) 130 MG tablet    No facility-administered encounter medications on file as of 02/11/2021.    Objective:   PHYSICAL EXAMINATION:    VITALS:   Vitals:   02/11/21 1344  BP: (!) 160/64  Pulse: 77  SpO2: 99%  Weight: 152 lb (68.9 kg)  Height: 5\' 1"  (1.549 m)     No data found.   GEN:  The patient appears stated age and is in NAD. HEENT:  Normocephalic, atraumatic.  The mucous membranes are moist. The superficial temporal arteries are without ropiness or tenderness. CV:  RRR Lungs:  CTAB Neck/HEME:  There are no carotid bruits bilaterally.  Neurological examination:  Orientation: The patient is alert and oriented x3. Cranial nerves: There is good facial symmetry with facial hypomimia. The speech is fluent and clear. Soft palate rises symmetrically and there is no tongue deviation. Hearing is intact to conversational tone. Sensation: Sensation is intact to light touch throughout Motor: Strength is at least antigravity x4.  Movement examination: Tone: There is normal tone in the upper and lower extremities Abnormal movements: No tremor seen or felt today at rest. Coordination:  There is no significant decremation with RAM's  Gait and Station: The patient requires assist out of the chair.   Once up she has a flapping tremor in the arms and some tremor in the legs as well.  We walked together in the hall and her knees will buckle frequently but she is able to hold onto the examiner and right herself.  I have reviewed and interpreted the following labs independently    Chemistry      Component Value Date/Time   NA 134 (L) 01/15/2021 1459   NA 135 10/05/2020 1150   K 4.2 01/15/2021 1459   CL 102 01/15/2021 1459   CO2 25 01/15/2021 1459   BUN 9 01/15/2021 1459   BUN 12 10/05/2020 1150   CREATININE 0.87 01/15/2021 1459   CREATININE 1.23 (H) 03/30/2020 1011      Component Value Date/Time   CALCIUM 8.7 01/15/2021 1459   ALKPHOS 34 (L) 01/15/2021 1459   AST 12 01/15/2021 1459   ALT 4 01/15/2021 1459   BILITOT 0.3 01/15/2021 1459  Lab Results  Component Value Date   WBC 6.7 01/15/2021   HGB 12.0 01/15/2021   HCT 35.4 (L) 01/15/2021   MCV 96.3 01/15/2021   PLT 308.0 01/15/2021    Lab Results  Component Value Date   TSH 1.85 02/03/2020     Total time spent on today's visit was 43 minutes, including both face-to-face time and nonface-to-face time.  Time included that spent on review of records (prior notes available to me/labs/imaging if pertinent), discussing treatment and goals, answering patient's questions and coordinating care.  Cc:  [name], MD

## 2021-02-11 ENCOUNTER — Ambulatory Visit (INDEPENDENT_AMBULATORY_CARE_PROVIDER_SITE_OTHER): Payer: Medicare Other | Admitting: Neurology

## 2021-02-11 ENCOUNTER — Encounter: Payer: Self-pay | Admitting: Neurology

## 2021-02-11 ENCOUNTER — Other Ambulatory Visit: Payer: Self-pay

## 2021-02-11 VITALS — BP 160/64 | HR 77 | Ht 61.0 in | Wt 152.0 lb

## 2021-02-11 DIAGNOSIS — G903 Multi-system degeneration of the autonomic nervous system: Secondary | ICD-10-CM | POA: Diagnosis not present

## 2021-02-11 DIAGNOSIS — G20 Parkinson's disease: Secondary | ICD-10-CM

## 2021-02-11 DIAGNOSIS — R413 Other amnesia: Secondary | ICD-10-CM

## 2021-02-11 MED ORDER — CARBIDOPA-LEVODOPA 25-100 MG PO TABS: ORAL_TABLET | ORAL | 0 refills | Status: DC

## 2021-02-11 MED ORDER — CARBIDOPA-LEVODOPA 25-100 MG PO TABS: 2.0000 | ORAL_TABLET | Freq: Three times a day (TID) | ORAL | 1 refills | Status: DC

## 2021-02-11 MED ORDER — ESCITALOPRAM OXALATE 10 MG PO TABS: 10.0000 mg | ORAL_TABLET | Freq: Every day | ORAL | 1 refills | Status: DC

## 2021-02-11 NOTE — Patient Instructions (Addendum)
Increase carbidopa/levodopa 25/100 to 1.5 tabs at 7am/11am/4pm x 1 week and then increase to carbidopa/levodopa 25/100, 2 tablets at 7am/11am/4pm Increase lexapro 10 mg daily Continue buspar as previous Consider motorized wheelchair We will send an order for swallow study I would recommend counseling to help with the anxiety You have been referred for a neurocognitive evaluation (i.e., evaluation of memory and thinking abilities). Please bring someone with you to this appointment if possible, as it is helpful for the neuropsychologist to hear from both you and another adult who knows you well. Please bring eyeglasses and hearing aids if you wear them.    The evaluation will take approximately 3 hours and has two parts:   The first part is a clinical interview with the neuropsychologist, Dr. Melvyn Novas or Dr. Nicole Kindred. During the interview, the neuropsychologist will speak with you and the individual you brought to the appointment.    The second part of the evaluation is testing with the doctor's technician, aka psychometrician, Hinton Dyer or [employer]. During the testing, the technician will ask you to remember different types of material, solve problems, and answer some questionnaires. Your family member will not be present for this portion of the evaluation.   Please note: We have to reserve several hours of the neuropsychologist's time and the psychometrician's time for your evaluation appointment. As such, there is a No-Show fee of $100. If you are unable to attend any of your appointments, please contact our office as soon as possible to reschedule.

## 2021-02-12 ENCOUNTER — Ambulatory Visit (INDEPENDENT_AMBULATORY_CARE_PROVIDER_SITE_OTHER): Payer: Medicare Other | Admitting: Internal Medicine

## 2021-02-12 ENCOUNTER — Encounter: Payer: Self-pay | Admitting: Internal Medicine

## 2021-02-12 VITALS — BP 126/64 | HR 62 | Ht 64.0 in | Wt 150.2 lb

## 2021-02-12 DIAGNOSIS — I495 Sick sinus syndrome: Secondary | ICD-10-CM | POA: Diagnosis not present

## 2021-02-12 DIAGNOSIS — Z950 Presence of cardiac pacemaker: Secondary | ICD-10-CM

## 2021-02-12 NOTE — Patient Instructions (Signed)
Medication Instructions:  Your physician recommends that you continue on your current medications as directed. Please refer to the Current Medication list given to you today.  Labwork: None ordered.  Testing/Procedures: None ordered.  Follow-Up: Your physician wants you to follow-up in: one year with Cristopher Peru, MD or one of the following Advanced Practice Providers on your designated Care Team:   Tommye Standard, [place] Legrand Como "Jonni Sanger" Chalmers Cater, [place]  Remote monitoring is used to monitor your Pacemaker from home. This monitoring reduces the number of office visits required to check your device to one time per year. It allows [place] to keep an eye on the functioning of your device to ensure it is working properly. You are scheduled for a device check from home on 04/30/2021. You may send your transmission at any time that day. If you have a wireless device, the transmission will be sent automatically. After your physician reviews your transmission, you will receive a postcard with your next transmission date.  Any Other Special Instructions Will Be Listed Below (If Applicable).  If you need a refill on your cardiac medications before your next appointment, please call your pharmacy.

## 2021-02-12 NOTE — Progress Notes (Signed)
HPI Mrs. Ismael returns today for followup. She is a pleasant 86 yo woman with a h/o CAD, HFPEF, breast CA and sinus node dysfunction. She has been diagnosed with Parkinsons and is on medical therapy. She has had trouble with ambulation and is considering getting a scooter.  Allergies  Allergen Reactions   Statins     Lipitor caused hospitalization - - depletion of electrolytes, fever, nausea, loss of appetite, couldn't get out of bed   Cymbalta [Duloxetine Hcl] Other (See Comments)    Dulled her too much, difficulty urinating, change in vision   Fosamax [Alendronate Sodium] Other (See Comments)    Caused chronic issues swallowing   Amitriptyline     hallucinations   Lyrica [Pregabalin]     Unknown   Neurontin [Gabapentin] Other (See Comments)    Dizziness and sedation (patient is tolerating in lower dose)   Zanaflex [Tizanidine] Other (See Comments)    Could not sleep     Current Outpatient Medications  Medication Sig Dispense Refill   acetaminophen (TYLENOL) 500 MG tablet Take 500 mg by mouth 2 (two) times daily.     busPIRone (BUSPAR) 10 MG tablet Take 1 tablet (10 mg total) by mouth 3 (three) times daily as needed. 90 tablet 1   carbidopa-levodopa (DHIVY) 25-100 MG tablet Samples of this drug were given to the patient, quantity 30 tablets (1 bottle), Lot Number [id number] Exp: 05/27/2022. 30 tablet 0   carbidopa-levodopa (SINEMET IR) 25-100 MG tablet Take 2 tablets by mouth 3 (three) times daily. 540 tablet 1   cholecalciferol (VITAMIN D) 1000 UNITS tablet Take 2,000 Units by mouth daily.      Cyanocobalamin 1000 MCG TBCR Take 1,000 mcg by mouth daily.      denosumab (PROLIA) 60 MG/ML SOSY injection Inject 60 mg into the skin every 6 (six) months.     Diclofenac Sodium (PENNSAID) 2 % SOLN Apply topically.     diclofenac Sodium (VOLTAREN) 1 % GEL Apply 2 g topically 4 (four) times daily.     dicyclomine (BENTYL) 10 MG capsule Take 1 capsule (10 mg total) by mouth 3  (three) times daily before meals. 90 capsule 11   escitalopram (LEXAPRO) 10 MG tablet Take 1 tablet (10 mg total) by mouth daily. 90 tablet 1   ezetimibe (ZETIA) 10 MG tablet Take 1 tablet (10 mg total) by mouth daily. 90 tablet 3   ferrous sulfate 325 (65 FE) MG tablet Take 325 mg by mouth 3 (three) times a week.     metoprolol succinate (TOPROL XL) 25 MG 24 hr tablet Take 0.5 tablets (12.5 mg total) by mouth daily. 45 tablet 90   omeprazole (PRILOSEC) 40 MG capsule TAKE ONE CAPSULE BY MOUTH EVERY MORNING and TAKE ONE CAPSULE BY MOUTH EVERY EVENING 60 capsule 7   Polyethyl Glycol-Propyl Glycol (LUBRICANT EYE DROPS) 0.4-0.3 % SOLN Place 1 drop into both eyes 3 (three) times daily as needed (dry/irritated eyes.).     polyethylene glycol (MIRALAX / GLYCOLAX) packet Take 17 g by mouth daily as needed (constipation).     sodium chloride (OCEAN) 0.65 % SOLN nasal spray Place 1 spray into both nostrils 4 (four) times daily as needed for congestion.     No current facility-administered medications for this visit.     Past Medical History:  Diagnosis Date   Allergy    Anemia    Cancer of left breast ([place]) 1978   CHF (congestive heart failure) ([place])  Chronic thoracic back pain    "T8; fracture; 03/2015; no OR" (05/05/2016)   GERD (gastroesophageal reflux disease)    Heart murmur    History of hiatal hernia    Hyperlipidemia    Hyperplastic colon polyp    Hypertension    IBS (irritable bowel syndrome)    Multiple thyroid nodules    Osteoporosis    T8 compression fx 03/2015    Personal history of radiation therapy    Presence of permanent cardiac pacemaker    Vitamin B12 deficiency     ROS:   All systems reviewed and negative except as noted in the HPI.   Past Surgical History:  Procedure Laterality Date   ANTERIOR CERVICAL DECOMP/DISCECTOMY FUSION  2008   C5-6   AUGMENTATION MAMMAPLASTY     BACK SURGERY     BREAST SURGERY     CARDIAC CATHETERIZATION  2001   EP IMPLANTABLE  DEVICE N/A 05/05/2016   Procedure: Pacemaker Implant;  Surgeon: Evans Lance, MD;  Location: [facility];  Service: Cardiovascular;  Laterality: N/A;   EXCISIONAL HEMORRHOIDECTOMY  1984   With subsequent correction of surgery   INSERT / REPLACE / REMOVE PACEMAKER     INTRAVASCULAR PRESSURE WIRE/FFR STUDY N/A 10/07/2019   Procedure: INTRAVASCULAR PRESSURE WIRE/FFR STUDY;  Surgeon: Jettie Booze, MD;  Location: [facility];  Service: Cardiovascular;  Laterality: N/A;   KNEE ARTHROSCOPY Left    "meniscus tear"   LEFT HEART CATH AND CORONARY ANGIOGRAPHY N/A 10/07/2019   Procedure: LEFT HEART CATH AND CORONARY ANGIOGRAPHY;  Surgeon: Jettie Booze, MD;  Location: [facility];  Service: Cardiovascular;  Laterality: N/A;   MASTECTOMY Left 1978   PLACEMENT OF BREAST IMPLANTS Left 1981   REDUCTION MAMMAPLASTY Right    SHOULDER ARTHROSCOPY W/ ROTATOR CUFF REPAIR Left 02/2006   "tear"   TUBAL LIGATION       Family History  Problem Relation Age of Onset   Stroke Sister    Diabetes Brother    Breast cancer Maternal Aunt    Atrial fibrillation Sister    Colon cancer Neg Hx    Stomach cancer Neg Hx      Social History   Socioeconomic History   Marital status: Married    Spouse name: Not on file   Number of children: 2   Years of education: Not on file   Highest education level: Not on file  Occupational History   Occupation: retired  Tobacco Use   Smoking status: Never   Smokeless tobacco: Never  Vaping Use   Vaping Use: Never used  Substance and Sexual Activity   Alcohol use: No    Alcohol/week: 0.0 standard drinks   Drug use: No   Sexual activity: Not Currently  Other Topics Concern   Not on file  Social History Narrative   Housewife, Lives with spouse, 2 children   Social Determinants of Health   Financial Resource Strain: Medium Risk   Difficulty of Paying Living Expenses: Somewhat hard  Food Insecurity: Not on file  Transportation  Needs: Not on file  Physical Activity: Not on file  Stress: Not on file  Social Connections: Not on file  Intimate Partner Violence: Not on file     BP 126/64   Pulse 62   Ht 5\' 4"  (1.626 m)   Wt 150 lb 3.2 oz (68.1 kg)   SpO2 96%   BMI 25.78 kg/m   Physical Exam:  Well appearing NAD HEENT:  Unremarkable Neck:  No JVD, no thyromegally Lymphatics:  No adenopathy Back:  No CVA tenderness Lungs:  Clear with no wheezes HEART:  Regular rate rhythm, no murmurs, no rubs, no clicks Abd:  soft, positive bowel sounds, no organomegally, no rebound, no guarding Ext:  2 plus pulses, no edema, no cyanosis, no clubbing Skin:  No rashes no nodules Neuro:  CN II through XII intact, motor grossly intact   DEVICE  Normal device function.  See PaceArt for details.   Assess/Plan:  HFPEF - she has become more sedentary due to the Parkinsons. I have asked her to avoid salty foods.  Sinus node dysfunction - she is asymptomatic, s/p PPM insertion. Peripheral edema - she is taking the lasix only as needed.  PPM - her Boston Sci device is stable. Her RV bipolar threshold is increased as is the bipolar impedence. We will follow.   Carleene Overlie Dariyon Urquilla,MD

## 2021-02-19 DIAGNOSIS — Z9181 History of falling: Secondary | ICD-10-CM | POA: Diagnosis not present

## 2021-02-19 DIAGNOSIS — M6281 Muscle weakness (generalized): Secondary | ICD-10-CM | POA: Diagnosis not present

## 2021-02-19 DIAGNOSIS — R2681 Unsteadiness on feet: Secondary | ICD-10-CM | POA: Diagnosis not present

## 2021-02-19 DIAGNOSIS — R262 Difficulty in walking, not elsewhere classified: Secondary | ICD-10-CM | POA: Diagnosis not present

## 2021-02-19 DIAGNOSIS — G8929 Other chronic pain: Secondary | ICD-10-CM | POA: Diagnosis not present

## 2021-02-19 DIAGNOSIS — G20 Parkinson's disease: Secondary | ICD-10-CM | POA: Diagnosis not present

## 2021-02-19 NOTE — Progress Notes (Signed)
Remote pacemaker transmission.   

## 2021-02-21 DIAGNOSIS — Z9181 History of falling: Secondary | ICD-10-CM | POA: Diagnosis not present

## 2021-02-21 DIAGNOSIS — R262 Difficulty in walking, not elsewhere classified: Secondary | ICD-10-CM | POA: Diagnosis not present

## 2021-02-21 DIAGNOSIS — G8929 Other chronic pain: Secondary | ICD-10-CM | POA: Diagnosis not present

## 2021-02-21 DIAGNOSIS — M6281 Muscle weakness (generalized): Secondary | ICD-10-CM | POA: Diagnosis not present

## 2021-02-21 DIAGNOSIS — G20 Parkinson's disease: Secondary | ICD-10-CM | POA: Diagnosis not present

## 2021-02-21 DIAGNOSIS — R2681 Unsteadiness on feet: Secondary | ICD-10-CM | POA: Diagnosis not present

## 2021-02-25 ENCOUNTER — Telehealth: Payer: Self-pay

## 2021-02-25 DIAGNOSIS — R1310 Dysphagia, unspecified: Secondary | ICD-10-CM

## 2021-02-25 DIAGNOSIS — G20 Parkinson's disease: Secondary | ICD-10-CM

## 2021-02-25 NOTE — Telephone Encounter (Signed)
-----  Message from [place], DO sent at 02/12/2021  7:25 AM EDT ----- Park Liter,  I forgot to tell you yesterday that this patient needs a MBE.  Dx:  dysphagia.  Please put in order and fax/call to schedule.    Misty,  See me about her (you have met her).  I would like to entertain some counseling with her.  Discussed it with her already

## 2021-02-25 NOTE — Telephone Encounter (Signed)
patient needs a MBE.  Dx:  dysphagia.  Please put in order and fax/call toschedule

## 2021-02-25 NOTE — Telephone Encounter (Signed)
Modified barium evaluation (swallow study)

## 2021-02-26 DIAGNOSIS — M6281 Muscle weakness (generalized): Secondary | ICD-10-CM | POA: Diagnosis not present

## 2021-02-26 DIAGNOSIS — Z9181 History of falling: Secondary | ICD-10-CM | POA: Diagnosis not present

## 2021-02-26 DIAGNOSIS — R262 Difficulty in walking, not elsewhere classified: Secondary | ICD-10-CM | POA: Diagnosis not present

## 2021-02-26 DIAGNOSIS — G8929 Other chronic pain: Secondary | ICD-10-CM | POA: Diagnosis not present

## 2021-02-26 DIAGNOSIS — G20 Parkinson's disease: Secondary | ICD-10-CM | POA: Diagnosis not present

## 2021-02-26 DIAGNOSIS — R2681 Unsteadiness on feet: Secondary | ICD-10-CM | POA: Diagnosis not present

## 2021-02-26 NOTE — Telephone Encounter (Signed)
Order has been created. Pending Prior Auth.

## 2021-02-27 ENCOUNTER — Telehealth: Payer: Self-pay | Admitting: *Deleted

## 2021-02-27 ENCOUNTER — Other Ambulatory Visit: Payer: Self-pay

## 2021-02-27 ENCOUNTER — Ambulatory Visit (INDEPENDENT_AMBULATORY_CARE_PROVIDER_SITE_OTHER): Payer: Medicare Other | Admitting: Family Medicine

## 2021-02-27 ENCOUNTER — Encounter: Payer: Self-pay | Admitting: Family Medicine

## 2021-02-27 ENCOUNTER — Other Ambulatory Visit (HOSPITAL_COMMUNITY): Payer: Self-pay

## 2021-02-27 DIAGNOSIS — R1310 Dysphagia, unspecified: Secondary | ICD-10-CM

## 2021-02-27 DIAGNOSIS — M170 Bilateral primary osteoarthritis of knee: Secondary | ICD-10-CM

## 2021-02-27 DIAGNOSIS — R059 Cough, unspecified: Secondary | ICD-10-CM

## 2021-02-27 NOTE — Assessment & Plan Note (Signed)
Bilateral injections given today.  Tolerated the procedure well.  Patient continues to have instability noted of the right knee.  Unfortunately patient continues to have difficulty she is not a surgical candidate for knee replacement.  Can follow-up again in 2 months and consider the possibility of back to steroid injections.  I do believe that patient's underlying issues including the Parkinson's is also playing a role.  Follow-up again in 2 months

## 2021-02-27 NOTE — Progress Notes (Signed)
[place] Phone: [phone] Subjective:   [name], am serving as a scribe for Dr. Hulan Saas.  This visit occurred during the SARS-CoV-2 public health emergency.  Safety protocols were in place, including screening questions prior to the visit, additional usage of staff PPE, and extensive cleaning of exam room while observing appropriate contact time as indicated for disinfecting solutions.   I'm seeing this patient by the request  of:  [name], MD  CC: Chronic knee pain follow-up  [id number]  01/15/2021 Patient has had difficulty with claudication in the calf previously.  We will get a D-dimer today with his not being able to get a Doppler ultrasound in the near future.  If normal we will continue to monitor.  If positive will need to consider the possibility of seeing if we can speed this up.  Has had difficulty in the knees previously as well.  We will continue to see the other providers including primary care and neurology.  Do think the knees are secondary to arthritic changes and will see how patient responds.  Differential also includes lumbar radiculopathy and may need to consider the possibility of further work-up again.  Patient has responded to epidurals previously and may consider this as well.  Patient will follow up with me again in 6 to 8 weeks   Patient has had difficulty with claudication in the calf previously.  We will get a D-dimer today with his not being able to get a Doppler ultrasound in the near future.  If normal we will continue to monitor.  If positive will need to consider the possibility of seeing if we can speed this up.  Has had difficulty in the knees previously as well.  We will continue to see the other providers including primary care and neurology.  Do think the knees are secondary to arthritic changes and will see how patient responds.  Differential also includes  lumbar radiculopathy and may need to consider the possibility of further work-up again.  Patient has responded to epidurals previously and may consider this as well.  Patient will follow up with me again in 6 to 8 weeks  Update 02/27/2021 [name] is a 86 y.o. female coming in with complaint of bilateral knee pain.  Patient has been found to have arthritic changes of the knees.  Last injections in the knees were taken June 21. Continued pain over medial aspect.   X-rays of the knee though were taken on January 16, 2021 showed some mild arthritic changes which is different than what is seen on the ultrasound.  Patient continues to have pain in thoracic spine with movement. [name] pain at rest. Pain in lower back occurring only in the mornings     Past Medical History:  Diagnosis Date   Allergy    Anemia    Cancer of left breast ([place]) 1978   CHF (congestive heart failure) ([place])    Chronic thoracic back pain    "T8; fracture; 03/2015; [name] OR" (05/05/2016)   GERD (gastroesophageal reflux disease)    Heart murmur    History of hiatal hernia    Hyperlipidemia    Hyperplastic colon polyp    Hypertension    IBS (irritable bowel syndrome)    Multiple thyroid nodules    Osteoporosis    T8 compression fx 03/2015    Personal history of radiation therapy    Presence of permanent cardiac pacemaker  Vitamin B12 deficiency    Past Surgical History:  Procedure Laterality Date   ANTERIOR CERVICAL DECOMP/DISCECTOMY FUSION  2008   C5-6   AUGMENTATION MAMMAPLASTY     BACK SURGERY     BREAST SURGERY     CARDIAC CATHETERIZATION  2001   EP IMPLANTABLE DEVICE N/A 05/05/2016   Procedure: Pacemaker Implant;  Surgeon: Evans Lance, MD;  Location: [facility];  Service: Cardiovascular;  Laterality: N/A;   EXCISIONAL HEMORRHOIDECTOMY  1984   With subsequent correction of surgery   INSERT / REPLACE / REMOVE PACEMAKER     INTRAVASCULAR PRESSURE WIRE/FFR STUDY N/A 10/07/2019   Procedure:  INTRAVASCULAR PRESSURE WIRE/FFR STUDY;  Surgeon: Jettie Booze, MD;  Location: [facility];  Service: Cardiovascular;  Laterality: N/A;   KNEE ARTHROSCOPY Left    "meniscus tear"   LEFT HEART CATH AND CORONARY ANGIOGRAPHY N/A 10/07/2019   Procedure: LEFT HEART CATH AND CORONARY ANGIOGRAPHY;  Surgeon: Jettie Booze, MD;  Location: [facility];  Service: Cardiovascular;  Laterality: N/A;   MASTECTOMY Left 1978   PLACEMENT OF BREAST IMPLANTS Left 1981   REDUCTION MAMMAPLASTY Right    SHOULDER ARTHROSCOPY W/ ROTATOR CUFF REPAIR Left 02/2006   "tear"   TUBAL LIGATION     Social History   Socioeconomic History   Marital status: Married    Spouse name: Not on file   Number of children: 2   Years of education: Not on file   Highest education level: Not on file  Occupational History   Occupation: retired  Tobacco Use   Smoking status: Never   Smokeless tobacco: Never  Vaping Use   Vaping Use: Never used  Substance and Sexual Activity   Alcohol use: [name]    Alcohol/week: 0.0 standard drinks   Drug use: [name]   Sexual activity: Not Currently  Other Topics Concern   Not on file  Social History Narrative   Housewife, Lives with spouse, 2 children   Social Determinants of Health   Financial Resource Strain: Medium Risk   Difficulty of Paying Living Expenses: Somewhat hard  Food Insecurity: Not on file  Transportation Needs: Not on file  Physical Activity: Not on file  Stress: Not on file  Social Connections: Not on file   Allergies  Allergen Reactions   Statins     Lipitor caused hospitalization - - depletion of electrolytes, fever, nausea, loss of appetite, couldn't get out of bed   Cymbalta [Duloxetine Hcl] Other (See Comments)    Dulled her too much, difficulty urinating, change in vision   Fosamax [Alendronate Sodium] Other (See Comments)    Caused chronic issues swallowing   Amitriptyline     hallucinations   Lyrica [Pregabalin]     Unknown    Neurontin [Gabapentin] Other (See Comments)    Dizziness and sedation (patient is tolerating in lower dose)   Zanaflex [Tizanidine] Other (See Comments)    Could not sleep   Family History  Problem Relation Age of Onset   Stroke Sister    Diabetes Brother    Breast cancer Maternal Aunt    Atrial fibrillation Sister    Colon cancer Neg Hx    Stomach cancer Neg Hx     Current Outpatient Medications (Endocrine & Metabolic):    denosumab (PROLIA) 60 MG/ML SOSY injection, Inject 60 mg into the skin every 6 (six) months.  Current Outpatient Medications (Cardiovascular):    ezetimibe (ZETIA) 10 MG tablet, Take 1 tablet (10  mg total) by mouth daily.   metoprolol succinate (TOPROL XL) 25 MG 24 hr tablet, Take 0.5 tablets (12.5 mg total) by mouth daily.  Current Outpatient Medications (Respiratory):    sodium chloride (OCEAN) 0.65 % SOLN nasal spray, Place 1 spray into both nostrils 4 (four) times daily as needed for congestion.  Current Outpatient Medications (Analgesics):    acetaminophen (TYLENOL) 500 MG tablet, Take 500 mg by mouth 2 (two) times daily.  Current Outpatient Medications (Hematological):    Cyanocobalamin 1000 MCG TBCR, Take 1,000 mcg by mouth daily.    ferrous sulfate 325 (65 FE) MG tablet, Take 325 mg by mouth 3 (three) times a week.  Current Outpatient Medications (Other):    busPIRone (BUSPAR) 10 MG tablet, Take 1 tablet (10 mg total) by mouth 3 (three) times daily as needed.   carbidopa-levodopa (DHIVY) 25-100 MG tablet, Samples of this drug were given to the patient, quantity 30 tablets (1 bottle), Lot Number [id number] Exp: 05/27/2022.   carbidopa-levodopa (SINEMET IR) 25-100 MG tablet, Take 2 tablets by mouth 3 (three) times daily.   cholecalciferol (VITAMIN D) 1000 UNITS tablet, Take 2,000 Units by mouth daily.    Diclofenac Sodium (PENNSAID) 2 % SOLN, Apply topically.   diclofenac Sodium (VOLTAREN) 1 % GEL, Apply 2 g topically 4 (four) times daily.    dicyclomine (BENTYL) 10 MG capsule, Take 1 capsule (10 mg total) by mouth 3 (three) times daily before meals.   escitalopram (LEXAPRO) 10 MG tablet, Take 1 tablet (10 mg total) by mouth daily.   omeprazole (PRILOSEC) 40 MG capsule, TAKE ONE CAPSULE BY MOUTH EVERY MORNING and TAKE ONE CAPSULE BY MOUTH EVERY EVENING   Polyethyl Glycol-Propyl Glycol (LUBRICANT EYE DROPS) 0.4-0.3 % SOLN, Place 1 drop into both eyes 3 (three) times daily as needed (dry/irritated eyes.).   polyethylene glycol (MIRALAX / GLYCOLAX) packet, Take 17 g by mouth daily as needed (constipation).   Reviewed prior external information including notes and imaging from  primary care provider As well as notes that were available from care everywhere and other healthcare systems.  Patient did have imaging including a brain DaTscan that is consistent with Parkinson syndrome.  Patient recently has had some difficulty with swallowing and is being scheduled for a barium swallow.  Past medical history, social, surgical and family history all reviewed in electronic medical record.  [name] pertanent information unless stated regarding to the chief complaint.   Review of Systems:  [name] headache, visual changes, nausea, vomiting, diarrhea, constipation, dizziness, abdominal pain, skin rash, fevers, chills, night sweats, weight loss, swollen lymph nodes,  joint swelling, chest pain, shortness of breath, mood changes. POSITIVE muscle aches, body aches  Objective  Blood pressure 124/68, pulse 77, height '5\' 4"'$  (1.626 m), SpO2 97 %.   General: [name] apparent distress alert not completely oriented.  Patient is accompanied with daughter. HEENT: Pupils equal, extraocular movements intact  Respiratory: Patient's speak in full sentences and does not appear short of breath  Cardiovascular: 1+ lower extremity edema,  Gait patient is in a wheelchair at the moment. MSK: Knee exams bilaterally show significant arthritic changes.  Severe tenderness to even light  palpation.  Patient does have some mild rigidity with some hypertonicity of some of the musculature.  Weakness noted of the lower extremities of 4 out of 5 but symmetric.  Seems to be neurovascularly intact distally.  After informed written and verbal consent, patient was seated on exam table. Right knee was prepped with alcohol swab and utilizing  anterolateral approach, patient's right knee space was injected with 48 mg per 3 mL of Monovisc (sodium hyaluronate) in a prefilled syringe was injected easily into the knee through a 22-gauge needle..Patient tolerated the procedure well without immediate complications.  After informed written and verbal consent, patient was seated on exam table. Left knee was prepped with alcohol swab and utilizing anterolateral approach, patient's left knee space was injected with 48 mg per 3 mL of Monovisc (sodium hyaluronate) in a prefilled syringe was injected easily into the knee through a 22-gauge needle..Patient tolerated the procedure well without immediate complications.   Impression and Recommendations:     The above documentation has been reviewed and is accurate and complete Lyndal Pulley, DO

## 2021-02-27 NOTE — Chronic Care Management (AMB) (Signed)
  Chronic Care Management   Note  02/27/2021 Name: [name] MRN: [id number] DOB: [birth year]  [name] is a 86 y.o. year old female who is a primary care patient of Burns, Claudina Lick, MD. I reached out to [employer] by phone today in response to a referral sent by Ms. Breawna S Brewbaker's PCP, Dr. Quay Burow      Ms. Machnik was given information about Chronic Care Management services today including:  CCM service includes personalized support from designated clinical staff supervised by her physician, including individualized plan of care and coordination with other care providers 24/7 contact phone numbers for assistance for urgent and routine care needs. Service will only be billed when office clinical staff spend 20 minutes or more in a month to coordinate care. Only one practitioner may furnish and bill the service in a calendar month. The patient may stop CCM services at any time (effective at the end of the month) by phone call to the office staff. The patient will be responsible for cost sharing (co-pay) of up to 20% of the service fee (after annual deductible is met).  Patient agreed to services and verbal consent obtained.   Follow up plan: Telephone appointment with care management team member scheduled for:03/20/21  Venise [place]wood  Care Guide, Embedded Care Coordination [facility]  Care Management  Direct Dial: [phone]

## 2021-02-27 NOTE — Patient Instructions (Signed)
Gel injections today Will take some time to work See me again in 2 months

## 2021-02-28 DIAGNOSIS — M6281 Muscle weakness (generalized): Secondary | ICD-10-CM | POA: Diagnosis not present

## 2021-02-28 DIAGNOSIS — G20 Parkinson's disease: Secondary | ICD-10-CM | POA: Diagnosis not present

## 2021-02-28 DIAGNOSIS — R2681 Unsteadiness on feet: Secondary | ICD-10-CM | POA: Diagnosis not present

## 2021-02-28 DIAGNOSIS — R262 Difficulty in walking, not elsewhere classified: Secondary | ICD-10-CM | POA: Diagnosis not present

## 2021-02-28 DIAGNOSIS — Z9181 History of falling: Secondary | ICD-10-CM | POA: Diagnosis not present

## 2021-02-28 DIAGNOSIS — G8929 Other chronic pain: Secondary | ICD-10-CM | POA: Diagnosis not present

## 2021-03-04 DIAGNOSIS — H04123 Dry eye syndrome of bilateral lacrimal glands: Secondary | ICD-10-CM | POA: Diagnosis not present

## 2021-03-04 DIAGNOSIS — H0102A Squamous blepharitis right eye, upper and lower eyelids: Secondary | ICD-10-CM | POA: Diagnosis not present

## 2021-03-04 DIAGNOSIS — H31091 Other chorioretinal scars, right eye: Secondary | ICD-10-CM | POA: Diagnosis not present

## 2021-03-04 DIAGNOSIS — H35373 Puckering of macula, bilateral: Secondary | ICD-10-CM | POA: Diagnosis not present

## 2021-03-05 ENCOUNTER — Telehealth (INDEPENDENT_AMBULATORY_CARE_PROVIDER_SITE_OTHER): Payer: Medicare Other | Admitting: Licensed Clinical Social Worker

## 2021-03-05 ENCOUNTER — Other Ambulatory Visit: Payer: Self-pay

## 2021-03-05 DIAGNOSIS — G8929 Other chronic pain: Secondary | ICD-10-CM | POA: Diagnosis not present

## 2021-03-05 DIAGNOSIS — R262 Difficulty in walking, not elsewhere classified: Secondary | ICD-10-CM | POA: Diagnosis not present

## 2021-03-05 DIAGNOSIS — R2681 Unsteadiness on feet: Secondary | ICD-10-CM | POA: Diagnosis not present

## 2021-03-05 DIAGNOSIS — G20 Parkinson's disease: Secondary | ICD-10-CM | POA: Diagnosis not present

## 2021-03-05 DIAGNOSIS — Z9181 History of falling: Secondary | ICD-10-CM | POA: Diagnosis not present

## 2021-03-05 DIAGNOSIS — M6281 Muscle weakness (generalized): Secondary | ICD-10-CM | POA: Diagnosis not present

## 2021-03-05 NOTE — Progress Notes (Signed)
Integrated Behavioral Health via Telemedicine Visit  03/05/2021 [name] [id number]  Number of Integrated Behavioral Health visits: 1 Session Start time: 1:00  Session End time: 1:50 Total time: 50   Referring Provider: Dr. Wells Guiles Tat Patient/Family location: [facility] Provider location: [facility] All persons participating in visit: Patient and SW Types of Service: Video visit  I connected with [name] and/or [name]'s  NA  via  Telephone or Geologist, engineering  (Video is Tree surgeon) and verified that I am speaking with the correct person using two identifiers. Discussed confidentiality: Yes   I discussed the limitations of telemedicine and the availability of in person appointments.  Discussed there is a possibility of technology failure and discussed alternative modes of communication if that failure occurs.  I discussed that engaging in this telemedicine visit, they consent to the provision of behavioral healthcare and the services will be billed under their insurance.  Patient and/or legal guardian expressed understanding and consented to Telemedicine visit: Yes   Presenting Concerns: Patient and/or family reports the following symptoms/concerns: Depressed Mood, worry anxiety  Duration of problem: Several Months; Severity of problem: moderate  Patient and/or Family's Strengths/Protective Factors: Social connections  Goals Addressed: Patient will:  Reduce symptoms of: depression   Increase knowledge and/or ability of: coping skills   Demonstrate ability to: Increase healthy adjustment to current life circumstances  Progress towards Goals: Ongoing  Interventions: Interventions utilized:  Solution-Focused Strategies, Behavioral Activation, and CBT Cognitive Behavioral Therapy Standardized Assessments completed: Not Needed  Patient and/or Family Response: Pt was appropriate and agrees with the plan.   Pt verbalizes stress, depressed mood over decline in health and having to rely on others .  Assessment: Patient currently experiencing Depressed mood, increased anxiety .   Patient may benefit from Ongoing therapy with solution focus and CBT.  Plan: Follow up with behavioral health clinician on : 2 to three weeks or as needed  Behavioral recommendations: Discussion with family , look at risk and benefits of moving to assisted living, and increase support in the home.  Referral(s): [employer] Resources:  Housing  I discussed the assessment and treatment plan with the patient and/or parent/guardian. They were provided an opportunity to ask questions and all were answered. They agreed with the plan and demonstrated an understanding of the instructions.   They were advised to call back or seek an in-person evaluation if the symptoms worsen or if the condition fails to improve as anticipated.  Emlyn Maves A Taylor-Paladino, LCSW

## 2021-03-06 ENCOUNTER — Ambulatory Visit (HOSPITAL_COMMUNITY)
Admission: RE | Admit: 2021-03-06 | Discharge: 2021-03-06 | Disposition: A | Discharge status: Home / Self Care | Payer: Medicare Other | Source: Ambulatory Visit | Attending: Neurology | Admitting: Neurology

## 2021-03-06 DIAGNOSIS — G20 Parkinson's disease: Secondary | ICD-10-CM | POA: Diagnosis not present

## 2021-03-06 DIAGNOSIS — R059 Cough, unspecified: Secondary | ICD-10-CM | POA: Insufficient documentation

## 2021-03-06 DIAGNOSIS — R1310 Dysphagia, unspecified: Secondary | ICD-10-CM | POA: Diagnosis not present

## 2021-03-07 ENCOUNTER — Other Ambulatory Visit: Payer: Self-pay | Admitting: Neurology

## 2021-03-07 DIAGNOSIS — Z9181 History of falling: Secondary | ICD-10-CM | POA: Diagnosis not present

## 2021-03-07 DIAGNOSIS — G8929 Other chronic pain: Secondary | ICD-10-CM | POA: Diagnosis not present

## 2021-03-07 DIAGNOSIS — M6281 Muscle weakness (generalized): Secondary | ICD-10-CM | POA: Diagnosis not present

## 2021-03-07 DIAGNOSIS — R2681 Unsteadiness on feet: Secondary | ICD-10-CM | POA: Diagnosis not present

## 2021-03-07 DIAGNOSIS — R1319 Other dysphagia: Secondary | ICD-10-CM

## 2021-03-07 DIAGNOSIS — R262 Difficulty in walking, not elsewhere classified: Secondary | ICD-10-CM | POA: Diagnosis not present

## 2021-03-07 DIAGNOSIS — G20 Parkinson's disease: Secondary | ICD-10-CM | POA: Diagnosis not present

## 2021-03-12 DIAGNOSIS — G20 Parkinson's disease: Secondary | ICD-10-CM | POA: Diagnosis not present

## 2021-03-12 DIAGNOSIS — Z9181 History of falling: Secondary | ICD-10-CM | POA: Diagnosis not present

## 2021-03-12 DIAGNOSIS — G8929 Other chronic pain: Secondary | ICD-10-CM | POA: Diagnosis not present

## 2021-03-12 DIAGNOSIS — R2681 Unsteadiness on feet: Secondary | ICD-10-CM | POA: Diagnosis not present

## 2021-03-12 DIAGNOSIS — M6281 Muscle weakness (generalized): Secondary | ICD-10-CM | POA: Diagnosis not present

## 2021-03-12 DIAGNOSIS — R262 Difficulty in walking, not elsewhere classified: Secondary | ICD-10-CM | POA: Diagnosis not present

## 2021-03-14 DIAGNOSIS — Z9181 History of falling: Secondary | ICD-10-CM | POA: Diagnosis not present

## 2021-03-14 DIAGNOSIS — R262 Difficulty in walking, not elsewhere classified: Secondary | ICD-10-CM | POA: Diagnosis not present

## 2021-03-14 DIAGNOSIS — G20 Parkinson's disease: Secondary | ICD-10-CM | POA: Diagnosis not present

## 2021-03-14 DIAGNOSIS — R2681 Unsteadiness on feet: Secondary | ICD-10-CM | POA: Diagnosis not present

## 2021-03-14 DIAGNOSIS — M6281 Muscle weakness (generalized): Secondary | ICD-10-CM | POA: Diagnosis not present

## 2021-03-14 DIAGNOSIS — G8929 Other chronic pain: Secondary | ICD-10-CM | POA: Diagnosis not present

## 2021-03-15 ENCOUNTER — Ambulatory Visit (INDEPENDENT_AMBULATORY_CARE_PROVIDER_SITE_OTHER): Payer: Medicare Other

## 2021-03-15 ENCOUNTER — Other Ambulatory Visit: Payer: Self-pay

## 2021-03-15 DIAGNOSIS — M810 Age-related osteoporosis without current pathological fracture: Secondary | ICD-10-CM | POA: Diagnosis not present

## 2021-03-15 MED ORDER — DENOSUMAB 60 MG/ML ~~LOC~~ SOSY
60.0000 mg | PREFILLED_SYRINGE | Freq: Once | SUBCUTANEOUS | Status: AC
  Administered 2021-03-15: 60 mg via SUBCUTANEOUS

## 2021-03-15 NOTE — Progress Notes (Addendum)
Pt here for Prolia injection per Dr Quay Burow.  Prolia '60mg'$  given subcutaneous in right arm and pt tolerated injection well.  Next Prolia injection scheduled for 09/16/21.  Dr Alain Marion: please cosign since PCP is out of office.  Medical screening examination/treatment/procedure(s) were performed by non-physician practitioner and as supervising physician I was immediately available for consultation/collaboration.  I agree with above. Lew Dawes, MD

## 2021-03-18 ENCOUNTER — Telehealth: Payer: Self-pay | Admitting: Neurology

## 2021-03-18 NOTE — Telephone Encounter (Signed)
Patient is coming in to the office on 03-28-21 to see Dr Tat

## 2021-03-18 NOTE — Telephone Encounter (Signed)
We can do all required documentation via a VV I believe.

## 2021-03-18 NOTE — Telephone Encounter (Signed)
Pt daughter would like for Tat to start the paper work for the electric wheelchair. She said they had talked about it at their last visit, and she does see a need for it.

## 2021-03-19 DIAGNOSIS — R262 Difficulty in walking, not elsewhere classified: Secondary | ICD-10-CM | POA: Diagnosis not present

## 2021-03-19 DIAGNOSIS — M6281 Muscle weakness (generalized): Secondary | ICD-10-CM | POA: Diagnosis not present

## 2021-03-19 DIAGNOSIS — Z9181 History of falling: Secondary | ICD-10-CM | POA: Diagnosis not present

## 2021-03-19 DIAGNOSIS — R2681 Unsteadiness on feet: Secondary | ICD-10-CM | POA: Diagnosis not present

## 2021-03-19 DIAGNOSIS — G8929 Other chronic pain: Secondary | ICD-10-CM | POA: Diagnosis not present

## 2021-03-19 DIAGNOSIS — G20 Parkinson's disease: Secondary | ICD-10-CM | POA: Diagnosis not present

## 2021-03-20 DIAGNOSIS — L821 Other seborrheic keratosis: Secondary | ICD-10-CM | POA: Diagnosis not present

## 2021-03-20 DIAGNOSIS — D225 Melanocytic nevi of trunk: Secondary | ICD-10-CM | POA: Diagnosis not present

## 2021-03-20 DIAGNOSIS — L814 Other melanin hyperpigmentation: Secondary | ICD-10-CM | POA: Diagnosis not present

## 2021-03-20 DIAGNOSIS — Z85828 Personal history of other malignant neoplasm of skin: Secondary | ICD-10-CM | POA: Diagnosis not present

## 2021-03-20 DIAGNOSIS — L578 Other skin changes due to chronic exposure to nonionizing radiation: Secondary | ICD-10-CM | POA: Diagnosis not present

## 2021-03-21 ENCOUNTER — Encounter: Payer: Self-pay | Admitting: *Deleted

## 2021-03-21 ENCOUNTER — Ambulatory Visit (INDEPENDENT_AMBULATORY_CARE_PROVIDER_SITE_OTHER): Payer: Medicare Other | Admitting: *Deleted

## 2021-03-21 DIAGNOSIS — M6281 Muscle weakness (generalized): Secondary | ICD-10-CM | POA: Diagnosis not present

## 2021-03-21 DIAGNOSIS — R262 Difficulty in walking, not elsewhere classified: Secondary | ICD-10-CM | POA: Diagnosis not present

## 2021-03-21 DIAGNOSIS — G20A1 Parkinson's disease without dyskinesia, without mention of fluctuations: Secondary | ICD-10-CM

## 2021-03-21 DIAGNOSIS — Z9181 History of falling: Secondary | ICD-10-CM | POA: Diagnosis not present

## 2021-03-21 DIAGNOSIS — I10 Essential (primary) hypertension: Secondary | ICD-10-CM | POA: Diagnosis not present

## 2021-03-21 DIAGNOSIS — E782 Mixed hyperlipidemia: Secondary | ICD-10-CM

## 2021-03-21 DIAGNOSIS — R2681 Unsteadiness on feet: Secondary | ICD-10-CM | POA: Diagnosis not present

## 2021-03-21 DIAGNOSIS — G8929 Other chronic pain: Secondary | ICD-10-CM | POA: Diagnosis not present

## 2021-03-21 DIAGNOSIS — G20 Parkinson's disease: Secondary | ICD-10-CM

## 2021-03-21 NOTE — Patient Instructions (Signed)
Visit Information   [place], it was nice talking with you today.   Please read over the attached information, and keep up the great work preventing falls and checking and writing down on paper you blood pressures at home- we will review these during each of our phone call appointments   I look forward to talking to you again for an update on Monday July 01, 2021 at 10:30 am- please be listening out for my call that day.  I will call as close to 10:30 am as possible.   If you need to cancel or re-schedule our telephone visit, please call [phone] and one of our care guides will be happy to assist you.   I look forward to hearing about your progress.   Please don't hesitate to contact me if I can be of assistance to you before our next scheduled telephone appointment.   Oneta Rack, RN, BSN, [facility] RN Care Coordination- [place] [phone]: direct office [phone]: mobile   PATIENT GOALS:   Goals Addressed             This Visit's Progress    Patient Self-Care Activities   On track    Timeframe:  Long-Range Goal Priority:  Medium Start Date:           03/21/21                  Expected End Date:    03/21/22                   Patient will self administer medications as prescribed Patient will attend all scheduled provider appointments Patient will call pharmacy for medication refills Patient will call provider office for new concerns or questions Patient will continue to engage with private duty caregivers at home for assistance with ADL's and iADL's Patient will continue to use assistive devices for fall prevention and practice safe techniques at home to prevent falls Patient will continue to monitor and write down on paper daily pressures at home Patient will continue to follow heart healthy, low salt, low cholesterol diet        Cholesterol Content in Foods Cholesterol is a waxy, fat-like substance that helps to carry fat  in the blood. The body needs cholesterol in small amounts, but too much cholesterol can causedamage to the arteries and heart. Most people should eat less than 200 milligrams (mg) of cholesterol a day. Foods with cholesterol  Cholesterol is found in animal-based foods, such as meat, seafood, and dairy. Generally, low-fat dairy and lean meats have less cholesterol than full-fat dairy and fatty meats. The milligrams of cholesterol per serving (mg per serving) of common cholesterol-containing foods are listed below. Meat and other proteins Egg -- one large whole egg has 186 mg. Veal shank -- 4 oz has 141 mg. Lean ground [place] (93% lean) -- 4 oz has 118 mg. Fat-trimmed lamb loin -- 4 oz has 106 mg. Lean ground beef (90% lean) -- 4 oz has 100 mg. Lobster -- 3.5 oz has 90 mg. Pork loin chops -- 4 oz has 86 mg. Canned salmon -- 3.5 oz has 83 mg. Fat-trimmed beef top loin -- 4 oz has 78 mg. Frankfurter -- 1 frank (3.5 oz) has 77 mg. Crab -- 3.5 oz has 71 mg. Roasted chicken without skin, white meat -- 4 oz has 66 mg. Light bologna -- 2 oz has 45 mg. Deli-cut [place] -- 2 oz has 31 mg.  Canned tuna -- 3.5 oz has 31 mg. Berniece Salines -- 1 oz has 29 mg. Oysters and mussels (raw) -- 3.5 oz has 25 mg. Mackerel -- 1 oz has 22 mg. Trout -- 1 oz has 20 mg. Pork sausage -- 1 link (1 oz) has 17 mg. Salmon -- 1 oz has 16 mg. Tilapia -- 1 oz has 14 mg. Dairy Soft-serve ice cream --  cup (4 oz) has 103 mg. Whole-milk yogurt -- 1 cup (8 oz) has 29 mg. Cheddar cheese -- 1 oz has 28 mg. American cheese -- 1 oz has 28 mg. Whole milk -- 1 cup (8 oz) has 23 mg. 2% milk -- 1 cup (8 oz) has 18 mg. Cream cheese -- 1 tablespoon (Tbsp) has 15 mg. Cottage cheese --  cup (4 oz) has 14 mg. Low-fat (1%) milk -- 1 cup (8 oz) has 10 mg. Sour cream -- 1 Tbsp has 8.5 mg. Low-fat yogurt -- 1 cup (8 oz) has 8 mg. Nonfat Greek yogurt -- 1 cup (8 oz) has 7 mg. Half-and-half cream -- 1 Tbsp has 5 mg. Fats and oils Cod liver  oil -- 1 tablespoon (Tbsp) has 82 mg. Butter -- 1 Tbsp has 15 mg. Lard -- 1 Tbsp has 14 mg. Bacon grease -- 1 Tbsp has 14 mg. Mayonnaise -- 1 Tbsp has 5-10 mg. Margarine -- 1 Tbsp has 3-10 mg. Exact amounts of cholesterol in these foods may vary depending on specificingredients and brands. Foods without cholesterol Most plant-based foods do not have cholesterol unless you combine them with a food that has cholesterol. Foods without cholesterol include: Grains and cereals. Vegetables. Fruits. Vegetable oils, such as olive, canola, and sunflower oil. Legumes, such as peas, beans, and lentils. Nuts and seeds. Egg whites. Summary The body needs cholesterol in small amounts, but too much cholesterol can cause damage to the arteries and heart. Most people should eat less than 200 milligrams (mg) of cholesterol a day. This information is not intended to replace advice given to you by your health care provider. Make sure you discuss any questions you have with your healthcare provider. Document Revised: 10/25/2019 Document Reviewed: 12/05/2019 Elsevier Patient Education  [employer].  Consent to CCM Services: Ms. Abt was given information about Chronic Care Management services including:  CCM service includes personalized support from designated clinical staff supervised by her physician, including individualized plan of care and coordination with other care providers 24/7 contact phone numbers for assistance for urgent and routine care needs. Service will only be billed when office clinical staff spend 20 minutes or more in a month to coordinate care. Only one practitioner may furnish and bill the service in a calendar month. The patient may stop CCM services at any time (effective at the end of the month) by phone call to the office staff. The patient will be responsible for cost sharing (co-pay) of up to 20% of the service fee (after annual deductible is met).  Patient agreed to  services and verbal consent obtained.   Patient verbalizes understanding of instructions provided today and agrees to view in [place].  Telephone follow up appointment with care management team member scheduled for:  Monday July 01, 2021 at 10:30 am The patient has been provided with contact information for the care management team and has been advised to call with any health related questions or concerns.   Oneta Rack, RN, BSN, [facility] RN Care Coordination- [place] [phone]: direct office [phone])  [phone]: mobile    CLINICAL CARE PLAN: Patient Care Plan: CCM Pharmacy Care Plan     Problem Identified: Parkinson's Disease, IBS, GERD   Priority: High     Long-Range Goal: Disease management   Start Date: 10/29/2020  Expected End Date: 04/30/2021  Recent Progress: On track  Priority: High  Note:   Current Barriers:  Unable to independently monitor therapeutic efficacy Unsure of how to self administer medications as prescribed  Pharmacist Clinical Goal(s):  Patient will achieve adherence to monitoring guidelines and medication adherence to achieve therapeutic efficacy achieve ability to self administer medications as prescribed through use of pill box as evidenced by patient report through collaboration with PharmD and provider.   Interventions: 1:1 collaboration with [name], MD regarding development and update of comprehensive plan of care as evidenced by provider attestation and co-signature Inter-disciplinary care team collaboration (see longitudinal plan of care) Comprehensive medication review performed; medication list updated in electronic medical record  Heart Failure / Hypertension    Type: Diastolic, grade 2 dysfunction Last ejection fraction: 60-65% (11/21/2016)  Patient checks BP at home several times per month Patient home BP readings are ranging: 113/48 - 140/56, HR 60s    Patient has failed these meds in past:  isosorbide [place] Patient is currently controlled on the following medications:  Metoprolol succinate 25 mg - 1/2 tab daily Furosemide 20 mg - PRN only for swelling   We discussed: previously cardiology had told pt to stop furosemide and KCl, and 1/2 metoprolol due to dizziness, mulltiple falls this year; she reports doing better since then; she was told to use furosemide only as needed for swelling, but she has not had to use it at all  Plan: Continue current medications   Hyperlipidemia    LDL goal < 100  Patient has failed these meds in past: atorvastatin (caused hospitalization) Patient is currently uncontrolled on the following medications:  Ezetimibe 10 mg daily AM   We discussed:  diet and exercise extensively; Cholesterol goals; benefits of ezetimibe for ASCVD risk reduction; pt reports high copay for ezetimibe - per formulary it is tier 3, so she has been using [facility] card to get it for $18 for 90 days. Pt cannot take statins since atorvastatin caused a hospitalization, may attempt tier exception for cheaper copay   Plan:  Continue current medications  Tier exception for Ezetimibe    Depression  Patient has failed these meds in past: none Patient is currently controlled on the following medications:  Escitalopram 5 mg daily  We discussed:  Pt reports some improvement in mood since starting medication  Plan: Continue current medications  Parkinson's Disease  Dx 07/2020 per neurology Patient has failed these meds in past: n/a Patient is currently controlled on the following medications:  Carbidopa-levodopa IR 25-100 TID 7a/11a/4p  We discussed:  Pt reports she has not noticed much difference since starting levodopa; she reports tremor is better some days but worse other days; she does say her sleep is much worse since starting the med; advised her to discuss this with her neurologist at f/u in June  Plan: Continue current medications; discuss issues with Dr Tat   GERD     Patient has failed these meds in past: n/a Patient is currently controlled on the following medications:  Omeprazole 40 mg BID   We discussed:  Pt reports some breakthrough symptoms but her main issuing is swallowing, she follows with GI   Plan: Continue current medications   Osteoporosis  Last DEXA Scan: 11/05/20 - improved in spine/hips compared to 2019             T-Score femoral neck: -1.9             T-Score total hip: n/a             T-Score lumbar spine: -3.3             T-Score forearm radius: n/a    Patient is a candidate for pharmacologic treatment due to T-Score < -2.5 in lumbar spine   Patient has failed these meds in past: alendronate Patient is currently controlled on the following medications:  Prolia 60 mg q6 months (last given 09/14/20) Vitamin D 1000 IU - 2 cap AM   We discussed:  Recommend [phone] units of vitamin D daily. Recommend 1200 mg of calcium daily from dietary and supplemental sources.   Plan: Continue current medications  Medication management  -Not ideally controlled - pt has 3 medications that are taken multiple times per day and have to be taken before meals, she is not sure how to take them -Current treatment  Carbidopa-Levodopa 25-100 mg TID (7am/11am/4pm) Dicyclomine 10 mg TID w/ meals Omeprazole 40 mg BID before meals -Counseled patient it is ok to take all medications together 30 min before meals. These medications do not interfere with absorption/action of each other and are safe to take together -Recommend to continue current medication  Patient Goals/Self-Care Activities Patient will:  - take medications as prescribed focus on medication adherence by pill box -Discuss sleep issues with Dr Tat    Patient Care Plan: [place] of Care     Problem Identified: Chronic Disease Management Needs   Priority: Medium     Long-Range Goal: Development of plan of care for long term chronic disease management   Start Date:  03/21/2021  Expected End Date: 03/21/2022  Priority: Medium  Note:   Current Barriers:  Chronic Disease Management support and education needs related to HTN, HLD, and Parkinson's disease Fragile state of health, multiple progressing chronic health conditions with mobility issues: wheelchair bound History of frequent falls- last reported fall February 2022  RNCM Clinical Goal(s):  Patient will demonstrate ongoing adherence to prescribed treatment plan for HTN, HLD, and Parkinson's Disease as evidenced by adherence to prescribed medication regimen contacting provider for new or worsened symptoms or questions daily monitoring/ recording of blood pressures at home, use of assistive devices for ambulation, ongoing engagement with private duty caregivers    through collaboration with Consulting civil engineer, provider, and care team.   Interventions: 1:1 collaboration with primary care provider regarding development and update of comprehensive plan of care as evidenced by provider attestation and co-signature Inter-disciplinary care team collaboration (see longitudinal plan of care) Evaluation of current treatment plan related to  self management and patient's adherence to plan as established by provider  Management of mobility issues/ frequent falls, Parkinson's Disease  (Status: New goal.) Evaluation of current treatment plan related to  Parkinson's disease with mobility issues , ADL IADL limitations self-management and patient's adherence to plan as established by provider. Discussed plans with patient for ongoing care management follow up and provided patient with direct contact information for care management team Reviewed scheduled/upcoming provider appointments including 03/22/21 mammogran screening; 03/28/21 and 04/05/21 neurology provider; 04/30/21 sports medicine/ rehabilitation; 04/30/21 PCP; 05/28/21 and 06/10/21 CCM pharmacy team; 06/11/21 neuropsychiatric provider; Discussed plans with patient for  ongoing care management follow up and provided patient with direct  contact information for care management team; Discussed with patient current private duty caregiver hours/ assistance needed/ interventions provided- patient has had private duty caregivers x 1 month for 8 hours/ day: reports "going well, they are very helpful; 2 caregivers- morning/ afternoon Discussed with patient current home health PT sessions: reports PT coming twice weekly, reports "helping" Fall assessment completed / fall prevention discussed: positive reinforcement provided for no recent falls  Hypertension: (Status: New goal.) Last practice recorded BP readings:  BP Readings from Last 3 Encounters:  02/27/21 124/68  02/12/21 126/64  02/11/21 (!) 160/64  Most recent eGFR/CrCl:  Lab Results  Component Value Date   EGFR 62 10/05/2020    No components found for: CRCL  Evaluation of current treatment plan related to hypertension self management and patient's adherence to plan as established by provider;   Reviewed prescribed diet heart healthy, low salt, low cholesterol Discussed plans with patient for ongoing care management follow up and provided patient with direct contact information for care management team; Provided education on prescribed diet heart healthy, low salt, low cholesterol: patient endorses following heart healthy diet;  Discussed complications of poorly controlled blood pressure such as heart disease, stroke, circulatory complications, vision complications, kidney impairment, sexual dysfunction;  Assessed social determinant of health barriers;  Confirmed patient monitors/ records on paper daily blood pressures: reviewed with patient today; reports general ranges between 128-166/59-66 Reviewed recent provider appointments with patient: she verbalizes a good understanding of post-visit instructions Confirmed patient active with CCM pharmacy team: patient is able to verbalize excellent understanding of  current medications, purpose/ dosing/ scheduling Confirmed patient requires minimal assistance with medication management: private duty caregivers/ husband assists minimally Discussed management of chronic neck pain with patient- reports well managed overall with tylenol and rest Discussed stress levels around her son's development of recent illness, uncertainty of whether to move to [place]: reports her daughter wants she and her husband to move to ALF, they are uncertain and considering, but her current preference is to remain at their current home with private duty caregiver assistance: emotional support provided, encouraged patient to continue considering her options/ desires and to continue to work with private duty caregivers; reports current medications "fairly helpful" in managing her stress around moving, her son's illness  Patient Goals/Self-Care Activities: Patient will self administer medications as prescribed Patient will attend all scheduled provider appointments Patient will call pharmacy for medication refills Patient will call provider office for new concerns or questions Patient will continue to engage with private duty caregivers at home for assistance with ADL's and iADL's Patient will continue to use assistive devices for fall prevention and practice safe techniques at home to prevent falls Patient will continue to monitor and write down on paper daily pressures at home Patient will continue to follow heart healthy, low salt, low cholesterol diet

## 2021-03-21 NOTE — Chronic Care Management (AMB) (Signed)
Chronic Care Management   CCM RN Visit Note  03/21/2021 Name: [name] MRN: [id number] DOB: Dec 05, 1934  Subjective: [name] is a 86 y.o. year old female who is a primary care patient of Burns, Claudina Lick, MD. The care management team was consulted for assistance with disease management and care coordination needs.    Engaged with patient by telephone for initial visit in response to provider referral for case management and/or care coordination services.   Consent to Services:  The patient was given information about Chronic Care Management services, agreed to services, and gave verbal consent 02/27/21 prior to initiation of services.  Please see initial visit note for detailed documentation.  Patient agreed to services and verbal consent obtained.   Assessment: Review of patient past medical history, allergies, medications, health status, including review of consultants reports, laboratory and other test data, was performed as part of comprehensive evaluation and provision of chronic care management services.   SDOH (Social Determinants of Health) assessments and interventions performed:  SDOH Interventions    Flowsheet Row Most Recent Value  SDOH Interventions   Food Insecurity Interventions Intervention Not Indicated  Housing Interventions Intervention Not Indicated  Transportation Interventions Intervention Not Indicated  [Husband provides transportation]      CCM Care Plan Allergies  Allergen Reactions   Statins     Lipitor caused hospitalization - - depletion of electrolytes, fever, nausea, loss of appetite, couldn't get out of bed   Cymbalta [Duloxetine Hcl] Other (See Comments)    Dulled her too much, difficulty urinating, change in vision   Fosamax [Alendronate Sodium] Other (See Comments)    Caused chronic issues swallowing   Amitriptyline     hallucinations   Lyrica [Pregabalin]     Unknown   Neurontin [Gabapentin] Other (See Comments)    Dizziness  and sedation (patient is tolerating in lower dose)   Zanaflex [Tizanidine] Other (See Comments)    Could not sleep   Outpatient Encounter Medications as of 03/21/2021  Medication Sig   acetaminophen (TYLENOL) 500 MG tablet Take 500 mg by mouth 2 (two) times daily.   busPIRone (BUSPAR) 10 MG tablet Take 1 tablet (10 mg total) by mouth 3 (three) times daily as needed.   carbidopa-levodopa (DHIVY) 25-100 MG tablet Samples of this drug were given to the patient, quantity 30 tablets (1 bottle), Lot Number [id number] Exp: 05/27/2022.   carbidopa-levodopa (SINEMET IR) 25-100 MG tablet Take 2 tablets by mouth 3 (three) times daily.   cholecalciferol (VITAMIN D) 1000 UNITS tablet Take 2,000 Units by mouth daily.    Cyanocobalamin 1000 MCG TBCR Take 1,000 mcg by mouth daily.    denosumab (PROLIA) 60 MG/ML SOSY injection Inject 60 mg into the skin every 6 (six) months.   Diclofenac Sodium (PENNSAID) 2 % SOLN Apply topically.   diclofenac Sodium (VOLTAREN) 1 % GEL Apply 2 g topically 4 (four) times daily.   dicyclomine (BENTYL) 10 MG capsule Take 1 capsule (10 mg total) by mouth 3 (three) times daily before meals.   escitalopram (LEXAPRO) 10 MG tablet Take 1 tablet (10 mg total) by mouth daily.   ezetimibe (ZETIA) 10 MG tablet Take 1 tablet (10 mg total) by mouth daily.   ferrous sulfate 325 (65 FE) MG tablet Take 325 mg by mouth 3 (three) times a week.   metoprolol succinate (TOPROL XL) 25 MG 24 hr tablet Take 0.5 tablets (12.5 mg total) by mouth daily.   omeprazole (PRILOSEC) 40 MG capsule TAKE ONE CAPSULE  BY MOUTH EVERY MORNING and TAKE ONE CAPSULE BY MOUTH EVERY EVENING   Polyethyl Glycol-Propyl Glycol (LUBRICANT EYE DROPS) 0.4-0.3 % SOLN Place 1 drop into both eyes 3 (three) times daily as needed (dry/irritated eyes.).   polyethylene glycol (MIRALAX / GLYCOLAX) packet Take 17 g by mouth daily as needed (constipation).   sodium chloride (OCEAN) 0.65 % SOLN nasal spray Place 1 spray into both nostrils  4 (four) times daily as needed for congestion.   No facility-administered encounter medications on file as of 03/21/2021.   Patient Active Problem List   Diagnosis Date Noted   IBS (irritable bowel syndrome) 10/28/2020   Tremor of both hands 07/30/2020   Left arm swelling 04/10/2020   Aortic atherosclerosis ([place]) 03/30/2020   Cellulitis of arm, left 03/30/2020   Chronic thoracic back pain 02/03/2020   Iron deficiency anemia 02/02/2020   Near syncope 09/27/2019   Hypertension 09/20/2019   Acute pain of right knee 08/03/2019   (HFpEF) heart failure with preserved ejection fraction ([place]) 08/02/2019   Pacemaker 07/19/2019   CKD (chronic kidney disease) 01/30/2019   Lumbar spinal stenosis 01/10/2018   Degenerative arthritis of knee, bilateral 12/15/2017   Epigastric pain 09/09/2017   Neuralgia 09/09/2017   Claudication of calf muscles ([place]) 11/22/2016   DOE (dyspnea on exertion) 11/22/2016   Hyperreflexia of lower extremity 07/16/2016   Bilateral leg paresthesia 07/16/2016   Carotid arterial disease ([place]) 07/13/2016   Poor balance 06/23/2016   Weakness of both lower extremities 06/23/2016   Sinus node dysfunction ([facility]) 05/05/2016   Right shoulder pain 03/14/2016   Multinodular goiter (nontoxic) 02/21/2016   Neck pain 02/15/2016   Prediabetes 12/17/2015   Dysphagia 12/17/2015   Anxiety 11/13/2015   T8 vertebral fracture ([place]) 04/09/2015   Overweight 10/09/2011   Allergic rhinitis, cause unspecified    GERD (gastroesophageal reflux disease) 02/05/2011   Vitamin B 12 deficiency 04/17/2009   Hyperlipidemia 04/17/2009   Osteoporosis 04/17/2009   ADENOCARCINOMA, BREAST, HX OF 04/17/2009   Constipation 03/08/2009   COLONIC POLYPS, HYPERPLASTIC, HX OF 03/06/2009   Conditions to be addressed/monitored:  HTN, HLD, and Parkinson's Disease  Care Plan : RN Care Manager Plan of Care  Updates made by Knox Royalty, RN since 03/21/2021 12:00 AM     Problem: Chronic Disease Management  Needs   Priority: Medium     Long-Range Goal: Development of plan of care for long term chronic disease management   Start Date: 03/21/2021  Expected End Date: 03/21/2022  Priority: Medium  Note:   Current Barriers:  Chronic Disease Management support and education needs related to HTN, HLD, and Parkinson's disease Fragile state of health, multiple progressing chronic health conditions with mobility issues: wheelchair bound History of frequent falls- last reported fall February 2022  RNCM Clinical Goal(s):  Patient will demonstrate ongoing adherence to prescribed treatment plan for HTN, HLD, and Parkinson's Disease as evidenced by adherence to prescribed medication regimen contacting provider for new or worsened symptoms or questions daily monitoring/ recording of blood pressures at home, use of assistive devices for ambulation, ongoing engagement with private duty caregivers    through collaboration with Consulting civil engineer, provider, and care team.   Interventions: 1:1 collaboration with primary care provider regarding development and update of comprehensive plan of care as evidenced by provider attestation and co-signature Inter-disciplinary care team collaboration (see longitudinal plan of care) Evaluation of current treatment plan related to  self management and patient's adherence to plan as established by provider  Management of  mobility issues/ frequent falls, Parkinson's Disease  (Status: New goal.) Evaluation of current treatment plan related to  Parkinson's disease with mobility issues , ADL IADL limitations self-management and patient's adherence to plan as established by provider. Discussed plans with patient for ongoing care management follow up and provided patient with direct contact information for care management team Reviewed scheduled/upcoming provider appointments including 03/22/21 mammogran screening; 03/28/21 and 04/05/21 neurology provider; 04/30/21 sports medicine/  rehabilitation; 04/30/21 PCP; 05/28/21 and 06/10/21 CCM pharmacy team; 06/11/21 neuropsychiatric provider; Discussed plans with patient for ongoing care management follow up and provided patient with direct contact information for care management team; Discussed with patient current private duty caregiver hours/ assistance needed/ interventions provided- patient has had private duty caregivers x 1 month for 8 hours/ day: reports "going well, they are very helpful; 2 caregivers- morning/ afternoon Discussed with patient current home health PT sessions: reports PT coming twice weekly, reports "helping" Fall assessment completed / fall prevention discussed: positive reinforcement provided for no recent falls  Hypertension: (Status: New goal.) Last practice recorded BP readings:  BP Readings from Last 3 Encounters:  02/27/21 124/68  02/12/21 126/64  02/11/21 (!) 160/64  Most recent eGFR/CrCl:  Lab Results  Component Value Date   EGFR 62 10/05/2020    No components found for: CRCL  Evaluation of current treatment plan related to hypertension self management and patient's adherence to plan as established by provider;   Reviewed prescribed diet heart healthy, low salt, low cholesterol Discussed plans with patient for ongoing care management follow up and provided patient with direct contact information for care management team; Provided education on prescribed diet heart healthy, low salt, low cholesterol: patient endorses following heart healthy diet;  Discussed complications of poorly controlled blood pressure such as heart disease, stroke, circulatory complications, vision complications, kidney impairment, sexual dysfunction;  Assessed social determinant of health barriers;  Confirmed patient monitors/ records on paper daily blood pressures: reviewed with patient today; reports general ranges between 128-166/59-66 Reviewed recent provider appointments with patient: she verbalizes a good  understanding of post-visit instructions Confirmed patient active with CCM pharmacy team: patient is able to verbalize excellent understanding of current medications, purpose/ dosing/ scheduling Confirmed patient requires minimal assistance with medication management: private duty caregivers/ husband assists minimally Discussed management of chronic neck pain with patient- reports well managed overall with tylenol and rest Discussed stress levels around her son's development of recent illness, uncertainty of whether to move to [place]: reports her daughter wants she and her husband to move to ALF, they are uncertain and considering, but her current preference is to remain at their current home with private duty caregiver assistance: emotional support provided, encouraged patient to continue considering her options/ desires and to continue to work with private duty caregivers; reports current medications "fairly helpful" in managing her stress around moving, her son's illness  Patient Goals/Self-Care Activities: Patient will self administer medications as prescribed Patient will attend all scheduled provider appointments Patient will call pharmacy for medication refills Patient will call provider office for new concerns or questions Patient will continue to engage with private duty caregivers at home for assistance with ADL's and iADL's Patient will continue to use assistive devices for fall prevention and practice safe techniques at home to prevent falls Patient will continue to monitor and write down on paper daily pressures at home Patient will continue to follow heart healthy, low salt, low cholesterol diet       Plan: Telephone follow up appointment with  care management team member scheduled for:  Monday July 01, 2021 at 10:30 am The patient has been provided with contact information for the care management team and has been advised to call with any health related  questions or concerns.   Oneta Rack, RN, BSN, [facility] RN Care Coordination- [place] [phone]: direct office [phone]: mobile

## 2021-03-22 ENCOUNTER — Other Ambulatory Visit: Payer: Self-pay

## 2021-03-22 ENCOUNTER — Ambulatory Visit
Admission: RE | Admit: 2021-03-22 | Discharge: 2021-03-22 | Disposition: A | Discharge status: Home / Self Care | Payer: Medicare Other | Source: Ambulatory Visit | Attending: Internal Medicine | Admitting: Internal Medicine

## 2021-03-22 DIAGNOSIS — Z1231 Encounter for screening mammogram for malignant neoplasm of breast: Secondary | ICD-10-CM | POA: Diagnosis not present

## 2021-03-26 DIAGNOSIS — G8929 Other chronic pain: Secondary | ICD-10-CM | POA: Diagnosis not present

## 2021-03-26 DIAGNOSIS — M6281 Muscle weakness (generalized): Secondary | ICD-10-CM | POA: Diagnosis not present

## 2021-03-26 DIAGNOSIS — Z9181 History of falling: Secondary | ICD-10-CM | POA: Diagnosis not present

## 2021-03-26 DIAGNOSIS — R262 Difficulty in walking, not elsewhere classified: Secondary | ICD-10-CM | POA: Diagnosis not present

## 2021-03-26 DIAGNOSIS — R2681 Unsteadiness on feet: Secondary | ICD-10-CM | POA: Diagnosis not present

## 2021-03-26 DIAGNOSIS — G20 Parkinson's disease: Secondary | ICD-10-CM | POA: Diagnosis not present

## 2021-03-26 NOTE — Progress Notes (Addendum)
Assessment/Plan:   1.  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  -DaT scan positive, with decreased uptake in bilateral putamen  -Patient does not think levodopa has been helpful and would like to wean off.  Given weaning schedule.  -Husband describes continued deterioration.  Discussed that this is the likely progression of disease.  -agree with power WC as below.  Pt would benefit from tilt to pressure relieve.  Pt unable to do that.  I will refer patient to PT Mobility/Seating Evaluation for power wheelchair.  -some functional aspects to examination.    -following with Dr. Renda Rolls  -Describing some heaviness to the head.  Will order AchR Ab.  She has had history of neck surgery, but we decided not to do an MRI cervical spine since she is not a surgical candidate.   2.  Orthostatic hypotension  -improved.  Discussed concept of permissive hypertension  -Following with cardiology.  Lasix has been discontinued.  Metoprolol has been cut in half.    3.  GAD/depression  -on buspar  -continue lexapro 10 mg daily  -following with LCSW.  -Long discussion with patient today about how depression can influence disease.  Concerned that her depression is really keeping her pretty isolated, even though she cannot move much.  She is worried about going into an assisted living because of the small apartment size, but I am worried about her continued living in the apartment, because it is not handicap accessible.  Discussed with her that in an assisted living, she would have activities, but her husband states that she would not participate with those.  She and I talked about the importance of doing so.  4.  Confusion  -neurocog testing P  -wonder if #3 contributes  5.  Dysphagia  -MBE was completed on March 06, 2021.  Swallowing  function was worse than 1 year prior.  There was evidence of oropharyngeal and pharyngeal esophageal phase dysphagia mechanical soft solids, thin liquids, meds crushed with pure recommended.   Subjective:   [name] was seen today in follow up for newly diagnosed parkinsonism.  Patient with her husband who supplements the history.  Daughter on phone who supplements history.  Patient present for wheelchair evaluation.   Pt needs power WC as she has difficulty getting to bathroom on time. Pt having trouble navigating getting in and out of shower and power WC would be of great benefit with this task that walker is of no benefit for.  Pt has the mental capability to use the power WC.  she cannot use manual WC because of endurance abilities to propel and poor coordination of UE's.   she could not mount a scooter/transfer onto safely, hence making it inappropriate for this patient.   Rollator not appropriate as pt tends to fall backward and walker/rollator not helpful.    Patient and family are frustrated because she continues to deteriorate.  Having difficulty holding up the head at some points.  Have looked at assisted living, but feel that it is too small.  Patient struggles with having to give away all her possessions. Current movement disorder medications: Carbidopa/levodopa 25/100, 2 po tid Lexapro, 10 mg daily (increased last visit)  ALLERGIES:   Allergies  Allergen Reactions   Statins     Lipitor caused hospitalization - -  depletion of electrolytes, fever, nausea, loss of appetite, couldn't get out of bed   Cymbalta [Duloxetine Hcl] Other (See Comments)    Dulled her too much, difficulty urinating, change in vision   Fosamax [Alendronate Sodium] Other (See Comments)    Caused chronic issues swallowing   Amitriptyline     hallucinations   Lyrica [Pregabalin]     Unknown   Neurontin [Gabapentin] Other (See Comments)    Dizziness and sedation (patient is tolerating in lower  dose)   Zanaflex [Tizanidine] Other (See Comments)    Could not sleep    CURRENT MEDICATIONS:  Outpatient Encounter Medications as of 03/28/2021  Medication Sig   acetaminophen (TYLENOL) 500 MG tablet Take 500 mg by mouth 2 (two) times daily.   busPIRone (BUSPAR) 10 MG tablet Take 1 tablet (10 mg total) by mouth 3 (three) times daily as needed.   carbidopa-levodopa (DHIVY) 25-100 MG tablet Samples of this drug were given to the patient, quantity 30 tablets (1 bottle), Lot Number [id number] Exp: 05/27/2022.   carbidopa-levodopa (SINEMET IR) 25-100 MG tablet Take 2 tablets by mouth 3 (three) times daily.   cholecalciferol (VITAMIN D) 1000 UNITS tablet Take 2,000 Units by mouth daily.    Cyanocobalamin 1000 MCG TBCR Take 1,000 mcg by mouth daily.    denosumab (PROLIA) 60 MG/ML SOSY injection Inject 60 mg into the skin every 6 (six) months.   Diclofenac Sodium (PENNSAID) 2 % SOLN Apply topically.   diclofenac Sodium (VOLTAREN) 1 % GEL Apply 2 g topically 4 (four) times daily.   dicyclomine (BENTYL) 10 MG capsule Take 1 capsule (10 mg total) by mouth 3 (three) times daily before meals.   escitalopram (LEXAPRO) 10 MG tablet Take 1 tablet (10 mg total) by mouth daily.   ezetimibe (ZETIA) 10 MG tablet Take 1 tablet (10 mg total) by mouth daily.   ferrous sulfate 325 (65 FE) MG tablet Take 325 mg by mouth 3 (three) times a week.   metoprolol succinate (TOPROL XL) 25 MG 24 hr tablet Take 0.5 tablets (12.5 mg total) by mouth daily.   omeprazole (PRILOSEC) 40 MG capsule TAKE ONE CAPSULE BY MOUTH EVERY MORNING and TAKE ONE CAPSULE BY MOUTH EVERY EVENING   Polyethyl Glycol-Propyl Glycol (LUBRICANT EYE DROPS) 0.4-0.3 % SOLN Place 1 drop into both eyes 3 (three) times daily as needed (dry/irritated eyes.).   polyethylene glycol (MIRALAX / GLYCOLAX) packet Take 17 g by mouth daily as needed (constipation).   sodium chloride (OCEAN) 0.65 % SOLN nasal spray Place 1 spray into both nostrils 4 (four) times daily  as needed for congestion.   No facility-administered encounter medications on file as of 03/28/2021.    Objective:   PHYSICAL EXAMINATION:    VITALS:   There were no vitals filed for this visit.    No data found.   GEN:  The patient appears stated age and is in NAD. HEENT:  Normocephalic, atraumatic.  The mucous membranes are moist.   Neurological examination:  Orientation: The patient is alert and oriented x3. Cranial nerves: There is good facial symmetry with facial hypomimia. The speech is fluent and clear.  Gait:  not tested today due to previous experience (see prior note).  In [place].   I have reviewed and interpreted the following labs independently    Chemistry      Component Value Date/Time   NA 134 (L) 01/15/2021 1459   NA 135 10/05/2020 1150   K 4.2 01/15/2021 1459   CL 102 01/15/2021  1459   CO2 25 01/15/2021 1459   BUN 9 01/15/2021 1459   BUN 12 10/05/2020 1150   CREATININE 0.87 01/15/2021 1459   CREATININE 1.23 (H) 03/30/2020 1011      Component Value Date/Time   CALCIUM 8.7 01/15/2021 1459   ALKPHOS 34 (L) 01/15/2021 1459   AST 12 01/15/2021 1459   ALT 4 01/15/2021 1459   BILITOT 0.3 01/15/2021 1459       Lab Results  Component Value Date   WBC 6.7 01/15/2021   HGB 12.0 01/15/2021   HCT 35.4 (L) 01/15/2021   MCV 96.3 01/15/2021   PLT 308.0 01/15/2021    Lab Results  Component Value Date   TSH 1.85 02/03/2020     Total time spent on today's visit was 50 minutes, including both face-to-face time and nonface-to-face time.  Time included that spent on review of records (prior notes available to me/labs/imaging if pertinent), discussing treatment and goals, answering patient's questions and coordinating care.  Cc:  [name], MD

## 2021-03-27 DIAGNOSIS — G20 Parkinson's disease: Secondary | ICD-10-CM | POA: Diagnosis not present

## 2021-03-27 DIAGNOSIS — G8929 Other chronic pain: Secondary | ICD-10-CM | POA: Diagnosis not present

## 2021-03-27 DIAGNOSIS — R262 Difficulty in walking, not elsewhere classified: Secondary | ICD-10-CM | POA: Diagnosis not present

## 2021-03-27 DIAGNOSIS — Z9181 History of falling: Secondary | ICD-10-CM | POA: Diagnosis not present

## 2021-03-27 DIAGNOSIS — R2681 Unsteadiness on feet: Secondary | ICD-10-CM | POA: Diagnosis not present

## 2021-03-27 DIAGNOSIS — M6281 Muscle weakness (generalized): Secondary | ICD-10-CM | POA: Diagnosis not present

## 2021-03-28 ENCOUNTER — Other Ambulatory Visit (INDEPENDENT_AMBULATORY_CARE_PROVIDER_SITE_OTHER): Payer: Medicare Other

## 2021-03-28 ENCOUNTER — Ambulatory Visit (INDEPENDENT_AMBULATORY_CARE_PROVIDER_SITE_OTHER): Payer: Medicare Other | Admitting: Neurology

## 2021-03-28 ENCOUNTER — Other Ambulatory Visit: Payer: Self-pay

## 2021-03-28 ENCOUNTER — Encounter: Payer: Self-pay | Admitting: Neurology

## 2021-03-28 VITALS — BP 125/67 | HR 72 | Ht 61.0 in | Wt 137.0 lb

## 2021-03-28 DIAGNOSIS — R29898 Other symptoms and signs involving the musculoskeletal system: Secondary | ICD-10-CM

## 2021-03-28 DIAGNOSIS — G20 Parkinson's disease: Secondary | ICD-10-CM | POA: Diagnosis not present

## 2021-03-28 MED ORDER — WHEELCHAIR MISC: 0 refills | Status: AC

## 2021-03-28 NOTE — Patient Instructions (Signed)
Week 1: Decrease carbidopa/levodopa 25/100 to 1 tablet three times per day  Week 2: Decrease carbidopa/levodopa 25/100 to 1 tablet twice per day  Week 3:  Decrease carbidopa/levodopa 25/100 to 1 tablet once per day  Week 4:  STOP carbidopa/levodopa 25/100  Your provider has requested that you have labwork completed today. The lab is located on the Second floor at [place], within the [facility] office. When you get off the elevator, turn right and go in the [facility] [address]; the first brown door on the left.  Tell the ladies behind the desk that you are there for lab work. If you are not called within 15 minutes please check with the front desk.   Once you complete your labs you are free to go. You will receive a call or message via MyChart with your lab results.

## 2021-03-28 NOTE — Addendum Note (Signed)
Addended by: Armen Pickup A on: 03/28/2021 04:00 PM   Modules accepted: Orders

## 2021-04-02 DIAGNOSIS — Z9181 History of falling: Secondary | ICD-10-CM | POA: Diagnosis not present

## 2021-04-02 DIAGNOSIS — M6281 Muscle weakness (generalized): Secondary | ICD-10-CM | POA: Diagnosis not present

## 2021-04-02 DIAGNOSIS — G8929 Other chronic pain: Secondary | ICD-10-CM | POA: Diagnosis not present

## 2021-04-02 DIAGNOSIS — R2681 Unsteadiness on feet: Secondary | ICD-10-CM | POA: Diagnosis not present

## 2021-04-02 DIAGNOSIS — R262 Difficulty in walking, not elsewhere classified: Secondary | ICD-10-CM | POA: Diagnosis not present

## 2021-04-02 DIAGNOSIS — G20 Parkinson's disease: Secondary | ICD-10-CM | POA: Diagnosis not present

## 2021-04-04 ENCOUNTER — Other Ambulatory Visit: Payer: Self-pay | Admitting: Internal Medicine

## 2021-04-04 ENCOUNTER — Ambulatory Visit (INDEPENDENT_AMBULATORY_CARE_PROVIDER_SITE_OTHER): Payer: Medicare Other

## 2021-04-04 VITALS — Ht 61.0 in | Wt 137.0 lb

## 2021-04-04 DIAGNOSIS — G8929 Other chronic pain: Secondary | ICD-10-CM | POA: Diagnosis not present

## 2021-04-04 DIAGNOSIS — G20 Parkinson's disease: Secondary | ICD-10-CM | POA: Diagnosis not present

## 2021-04-04 DIAGNOSIS — Z9181 History of falling: Secondary | ICD-10-CM | POA: Diagnosis not present

## 2021-04-04 DIAGNOSIS — M6281 Muscle weakness (generalized): Secondary | ICD-10-CM | POA: Diagnosis not present

## 2021-04-04 DIAGNOSIS — R2681 Unsteadiness on feet: Secondary | ICD-10-CM | POA: Diagnosis not present

## 2021-04-04 DIAGNOSIS — Z0000 Encounter for general adult medical examination without abnormal findings: Secondary | ICD-10-CM

## 2021-04-04 DIAGNOSIS — R262 Difficulty in walking, not elsewhere classified: Secondary | ICD-10-CM | POA: Diagnosis not present

## 2021-04-04 LAB — MYASTHENIA GRAVIS PANEL 1
A CHR BINDING ABS: <0.3 nmol/L
STRIATED MUSCLE AB SCREEN: NEGATIVE

## 2021-04-04 NOTE — Progress Notes (Signed)
I connected with [name] today by telephone and verified that I am speaking with the correct person using two identifiers. Location patient: home Location provider: work Persons participating in the virtual visit: patient, provider.   I discussed the limitations, risks, security and privacy concerns of performing an evaluation and management service by telephone and the availability of in person appointments. I also discussed with the patient that there may be a patient responsible charge related to this service. The patient expressed understanding and verbally consented to this telephonic visit.    Interactive audio and video telecommunications were attempted between this provider and patient, however failed, due to patient having technical difficulties OR patient did not have access to video capability.  We continued and completed visit with audio only.  Some vital signs may be absent or patient reported.   Time Spent with patient on telephone encounter: 30 minutes  Subjective:   [name] is a 86 y.o. female who presents for Medicare Annual (Subsequent) preventive examination.  Review of Systems     Cardiac Risk Factors include: advanced age (>30mn, >>71women);dyslipidemia;family history of premature cardiovascular disease;hypertension     Objective:    Today's Vitals   04/04/21 1423  Weight: 137 lb (62.1 kg)  Height: '5\' 1"'$  (1.549 m)  PainSc: 0-No pain   Body mass index is 25.89 kg/m.  Advanced Directives 04/04/2021 03/21/2021 02/11/2021 01/10/2021 09/27/2020 09/10/2020 08/23/2020  Does Patient Have a Medical Advance Directive? Yes Yes Yes Yes Yes Yes Yes  Type of Advance Directive Living will;Healthcare Power of Attorney Living will;Healthcare Power of [place];Out of facility DNR (pink MOST or yellow form) [place];Out of facility DNR (pink MOST or yellow form) [place]  Does patient want to make changes to medical advance directive? No - Patient declined No - Patient declined - - - - -  Copy of [place] Chart? No - copy requested - - - - - -  Would patient like information on creating a medical advance directive? - - - - - - -    Current Medications (verified) Outpatient Encounter Medications as of 04/04/2021  Medication Sig   acetaminophen (TYLENOL) 500 MG tablet Take 500 mg by mouth 2 (two) times daily.   busPIRone (BUSPAR) 10 MG tablet Take 1 tablet (10 mg total) by mouth 3 (three) times daily as needed.   carbidopa-levodopa (DHIVY) 25-100 MG tablet Samples of this drug were given to the patient, quantity 30 tablets (1 bottle), Lot Number [id number]: 05/27/2022.   carbidopa-levodopa (SINEMET IR) 25-100 MG tablet Take 2 tablets by mouth 3 (three) times daily.   cholecalciferol (VITAMIN D) 1000 UNITS tablet Take 2,000 Units by mouth daily.    Cyanocobalamin 1000 MCG TBCR Take 1,000 mcg by mouth daily.    denosumab (PROLIA) 60 MG/ML SOSY injection Inject 60 mg into the skin every 6 (six) months.   Diclofenac Sodium (PENNSAID) 2 % SOLN Apply topically.   diclofenac Sodium (VOLTAREN) 1 % GEL Apply 2 g topically 4 (four) times daily.   dicyclomine (BENTYL) 10 MG capsule Take 1 capsule (10 mg total) by mouth 3 (three) times daily before meals.   escitalopram (LEXAPRO) 10 MG tablet Take 1 tablet (10 mg total) by mouth daily.   ezetimibe (ZETIA) 10 MG tablet TAKE ONE TABLET BY MOUTH EVERY MORNING   ferrous sulfate 325 (65  FE) MG tablet Take 325 mg by mouth 3 (three) times a week.   metoprolol succinate (TOPROL XL) 25 MG 24 hr tablet Take 0.5 tablets (12.5 mg total) by mouth daily.   Misc. Devices [facility]) MISC Power Wheelchair   omeprazole (PRILOSEC) 40 MG capsule TAKE ONE CAPSULE BY MOUTH EVERY MORNING and TAKE ONE CAPSULE BY MOUTH EVERY EVENING    Polyethyl Glycol-Propyl Glycol (LUBRICANT EYE DROPS) 0.4-0.3 % SOLN Place 1 drop into both eyes 3 (three) times daily as needed (dry/irritated eyes.).   polyethylene glycol (MIRALAX / GLYCOLAX) packet Take 17 g by mouth daily as needed (constipation).   sodium chloride (OCEAN) 0.65 % SOLN nasal spray Place 1 spray into both nostrils 4 (four) times daily as needed for congestion.   No facility-administered encounter medications on file as of 04/04/2021.    Allergies (verified) Statins, Cymbalta [duloxetine hcl], Fosamax [alendronate sodium], Amitriptyline, Lyrica [pregabalin], Neurontin [gabapentin], and Zanaflex [tizanidine]   History: Past Medical History:  Diagnosis Date   Allergy    Anemia    Cancer of left breast ([place]) 1978   CHF (congestive heart failure) ([place])    Chronic thoracic back pain    "T8; fracture; 03/2015; no OR" (05/05/2016)   GERD (gastroesophageal reflux disease)    Heart murmur    History of hiatal hernia    Hyperlipidemia    Hyperplastic colon polyp    Hypertension    IBS (irritable bowel syndrome)    Multiple thyroid nodules    Osteoporosis    T8 compression fx 03/2015    Personal history of radiation therapy    Presence of permanent cardiac pacemaker    Vitamin B12 deficiency    Past Surgical History:  Procedure Laterality Date   ANTERIOR CERVICAL DECOMP/DISCECTOMY FUSION  2008   C5-6   AUGMENTATION MAMMAPLASTY     BACK SURGERY     BREAST SURGERY     CARDIAC CATHETERIZATION  2001   EP IMPLANTABLE DEVICE N/A 05/05/2016   Procedure: Pacemaker Implant;  Surgeon: Evans Lance, MD;  Location: [place] [facility];  Service: Cardiovascular;  Laterality: N/A;   EXCISIONAL HEMORRHOIDECTOMY  1984   With subsequent correction of surgery   INSERT / REPLACE / REMOVE PACEMAKER     INTRAVASCULAR PRESSURE WIRE/FFR STUDY N/A 10/07/2019   Procedure: INTRAVASCULAR PRESSURE WIRE/FFR STUDY;  Surgeon: Jettie Booze, MD;  Location: [place] [facility];  Service:  Cardiovascular;  Laterality: N/A;   KNEE ARTHROSCOPY Left    "meniscus tear"   LEFT HEART CATH AND CORONARY ANGIOGRAPHY N/A 10/07/2019   Procedure: LEFT HEART CATH AND CORONARY ANGIOGRAPHY;  Surgeon: Jettie Booze, MD;  Location: [place] [facility];  Service: Cardiovascular;  Laterality: N/A;   MASTECTOMY Left 1978   PLACEMENT OF BREAST IMPLANTS Left 1981   REDUCTION MAMMAPLASTY Right    SHOULDER ARTHROSCOPY W/ ROTATOR CUFF REPAIR Left 02/2006   "tear"   TUBAL LIGATION     Family History  Problem Relation Age of Onset   Stroke Sister    Diabetes Brother    Breast cancer Maternal Aunt    Atrial fibrillation Sister    Colon cancer Neg Hx    Stomach cancer Neg Hx    Social History   Socioeconomic History   Marital status: Married    Spouse name: Not on file   Number of children: 2   Years of education: Not on file   Highest education level: Not on file  Occupational History   Occupation:  retired  Tobacco Use   Smoking status: Never   Smokeless tobacco: Never  Vaping Use   Vaping Use: Never used  Substance and Sexual Activity   Alcohol use: No    Alcohol/week: 0.0 standard drinks   Drug use: No   Sexual activity: Not Currently  Other Topics Concern   Not on file  Social History Narrative   Housewife, Lives with spouse, 2 children   Social Determinants of Health   Financial Resource Strain: Low Risk    Difficulty of Paying Living Expenses: Not hard at all  Food Insecurity: No Food Insecurity   Worried About Charity fundraiser in the Last Year: Never true   Arboriculturist in the Last Year: Never true  Transportation Needs: No Transportation Needs   Lack of Transportation (Medical): No   Lack of Transportation (Non-Medical): No  Physical Activity: Inactive   Days of Exercise per Week: 0 days   Minutes of Exercise per Session: 0 min  Stress: No Stress Concern Present   Feeling of Stress : Not at all  Social Connections: Socially Integrated   Frequency of  Communication with Friends and Family: More than three times a week   Frequency of Social Gatherings with Friends and Family: More than three times a week   Attends Religious Services: 1 to 4 times per year   Active Member of [employer] or Organizations: No   Attends Music therapist: 1 to 4 times per year   Marital Status: Married    Tobacco Counseling Counseling given: Not Answered   Clinical Intake:  Pre-visit preparation completed: Yes  Pain : No/denies pain Pain Score: 0-No pain     BMI - recorded: 25.89 Nutritional Status: BMI 25 -29 Overweight Nutritional Risks: None Diabetes: No  How often do you need to have someone help you when you read instructions, pamphlets, or other written materials from your doctor or pharmacy?: 1 - Never What is the last grade level you completed in school?: High School Graduate  Diabetic? no  Interpreter Needed?: No  Information entered by :: Lisette Abu, LPN   Activities of Daily Living In your present state of health, do you have any difficulty performing the following activities: 04/04/2021  Hearing? Y  Comment wears hearing aids  Vision? Y  Difficulty concentrating or making decisions? Y  Walking or climbing stairs? Y  Dressing or bathing? Y  Doing errands, shopping? Y  Preparing Food and eating ? Y  Using the Toilet? N  In the past six months, have you accidently leaked urine? N  Do you have problems with loss of bowel control? N  Managing your Medications? Y  Managing your Finances? Y  Housekeeping or managing your Housekeeping? Y  Some recent data might be hidden    Patient Care Team: [name], MD as PCP - General (Internal Medicine) Evans Lance, MD as PCP - Cardiology (Cardiology) Ladene Artist, MD (Gastroenterology) Servando Salina, MD (Obstetrics and Gynecology) Melida Quitter, MD as Consulting Physician (Otolaryngology) Allyn Kenner, MD (Dermatology) Berle Mull, MD (Sports  Medicine) Jovita Gamma, MD as Consulting Physician (Neurosurgery) Evans Lance, MD as Consulting Physician (Cardiology) Martinique, Peter M, MD as Consulting Physician (Cardiology) Charlton Haws, [facility] as Pharmacist (Pharmacist) Tat, Eustace Quail, DO as Consulting Physician (Neurology) Knox Royalty, RN as Case Manager Lonia Skinner, MD as Consulting Physician (Ophthalmology)  Indicate any recent Medical Services you may have received from other than [facility] providers in  the past year (date may be approximate).     Assessment:   This is a routine wellness examination for [name].  Hearing/Vision screen Hearing Screening - Comments:: Patient wears hearing aids. Vision Screening - Comments:: Patient wears eyeglasses.  Eye exam done annually by Dr. Lonia Skinner.    Dietary issues and exercise activities discussed: Current Exercise Habits: The patient does not participate in regular exercise at present, Exercise limited by: neurologic condition(s);cardiac condition(s)   Goals Addressed   None   Depression Screen PHQ 2/9 Scores 04/04/2021 02/03/2020 02/02/2019 01/31/2019 12/17/2017 12/10/2016 03/14/2016  PHQ - 2 Score 0 0 0 0 0 0 0    Fall Risk Fall Risk  04/04/2021 03/21/2021 02/11/2021 01/10/2021 09/27/2020  Falls in the past year? '1 1 1 1 1  '$ Comment - last fall February 2022 - - -  Number falls in past yr: '1 1 1 1 1  '$ Injury with Fall? 1 1 0 1 0  Risk for fall due to : Other (Comment);Impaired mobility;Medication side effect;History of fall(s) History of fall(s);Impaired mobility;Medication side effect - - -  Risk for fall due to: Comment Parkinson's Disease wheelchair bound - - -  Follow up Falls evaluation completed Falls prevention discussed - - -    FALL RISK PREVENTION PERTAINING TO THE HOME:  Any stairs in or around the home? No  If so, are there any without handrails? No  Home free of loose throw rugs in walkways, pet beds, electrical cords, etc? Yes  Adequate lighting in  your home to reduce risk of falls? Yes   ASSISTIVE DEVICES UTILIZED TO PREVENT FALLS:  Life alert? No  Use of a cane, walker or w/c? Yes  Grab bars in the bathroom? Yes  Shower chair or bench in shower? Yes  Elevated toilet seat or a handicapped toilet? Yes   TIMED UP AND GO:  Was the test performed? No .  Length of time to ambulate 10 feet: n/a sec.   Appearance of gait: patient stated that she is in a wheelchair due to Parkinson's Disease.  Cognitive Function: MMSE - Mini Mental State Exam 12/17/2017  Not completed: Refused        Immunizations Immunization History  Administered Date(s) Administered   Fluad Quad(high Dose 65+) 05/17/2019, 05/26/2020   Influenza Split 06/27/2011, 06/09/2012   Influenza Whole 07/09/2009, 07/02/2010   Influenza, High Dose Seasonal PF 05/02/2013, 05/31/2014, 06/26/2015, 06/02/2016, 05/22/2017, 05/17/2018   PFIZER(Purple Top)SARS-COV-2 Vaccination 09/10/2019, 10/03/2019, 07/13/2020   Pneumococcal Conjugate-13 06/19/2014   Pneumococcal Polysaccharide-23 07/28/2000, 10/09/2011   Td 07/28/2000   Tdap 10/15/2010   Zoster, Live 03/12/2011    TDAP status: Due, Education has been provided regarding the importance of this vaccine. Advised may receive this vaccine at local pharmacy or Health Dept. Aware to provide a copy of the vaccination record if obtained from local pharmacy or Health Dept. Verbalized acceptance and understanding.  Flu Vaccine status: Up to date  Pneumococcal vaccine status: Up to date  Covid-19 vaccine status: Completed vaccines  Qualifies for Shingles Vaccine? Yes   Zostavax completed Yes   Shingrix Completed?: No.    Education has been provided regarding the importance of this vaccine. Patient has been advised to call insurance company to determine out of pocket expense if they have not yet received this vaccine. Advised may also receive vaccine at local pharmacy or Health Dept. Verbalized acceptance and  understanding.  Screening Tests Health Maintenance  Topic Date Due   Zoster Vaccines- Shingrix (1 of 2)  Never done   COVID-19 Vaccine (4 - Booster for Pfizer series) 10/05/2020   TETANUS/TDAP  10/14/2020   INFLUENZA VACCINE  02/25/2021   DEXA SCAN  11/06/2022   PNA vac Low Risk Adult  Completed   HPV VACCINES  Aged Out    Health Maintenance  Health Maintenance Due  Topic Date Due   Zoster Vaccines- Shingrix (1 of 2) Never done   COVID-19 Vaccine (4 - Booster for Pfizer series) 10/05/2020   TETANUS/TDAP  10/14/2020   INFLUENZA VACCINE  02/25/2021    Colorectal cancer screening: No longer required.   Mammogram status: Completed 03/22/2021/. Repeat every year  Bone Density status: Completed 11/05/2020. Results reflect: Bone density results: OSTEOPOROSIS. Repeat every 2 years.  Lung Cancer Screening: (Low Dose CT Chest recommended if Age 37-80 years, 30 pack-year currently smoking OR have quit w/in 15years.) does not qualify.   Lung Cancer Screening Referral: no  Additional Screening:  Hepatitis C Screening: does not qualify; Completed no  Vision Screening: Recommended annual ophthalmology exams for early detection of glaucoma and other disorders of the eye. Is the patient up to date with their annual eye exam?  Yes  Who is the provider or what is the name of the office in which the patient attends annual eye exams? Dr. Lonia Skinner  If pt is not established with a provider, would they like to be referred to a provider to establish care? No .   Dental Screening: Recommended annual dental exams for proper oral hygiene  Community Resource Referral / Chronic Care Management: CRR required this visit?  No   CCM required this visit?  No      Plan:     I have personally reviewed and noted the following in the patient's chart:   Medical and social history Use of alcohol, tobacco or illicit drugs  Current medications and supplements including opioid prescriptions.   Functional ability and status Nutritional status Physical activity Advanced directives List of other physicians Hospitalizations, surgeries, and ER visits in previous 12 months Vitals Screenings to include cognitive, depression, and falls Referrals and appointments  In addition, I have reviewed and discussed with patient certain preventive protocols, quality metrics, and best practice recommendations. A written personalized care plan for preventive services as well as general preventive health recommendations were provided to patient.     Sheral Flow, LPN   [id number]   Nurse Notes:  Patient is cogitatively intact. There were no vitals filed for this visit. Hearing Screening - Comments:: Patient wears hearing aids. Vision Screening - Comments:: Patient wears eyeglasses.  Eye exam done annually by Dr. Lonia Skinner.

## 2021-04-04 NOTE — Patient Instructions (Signed)
[name] , Thank you for taking time to come for your Medicare Wellness Visit. I appreciate your ongoing commitment to your health goals. Please review the following plan we discussed and let me know if I can assist you in the future.   Screening recommendations/referrals: Colonoscopy: No longer recommended due to age 86: 03/22/2021; due every year Bone Density: 11/05/2020; due every 2 years Recommended yearly ophthalmology/optometry visit for glaucoma screening and checkup Recommended yearly dental visit for hygiene and checkup  Vaccinations: Influenza vaccine: 05/26/2020 Pneumococcal vaccine: 10/09/2011, 06/19/2014 Tdap vaccine: 10/15/2010; due every 10 years (overdue) Shingles vaccine: never done   Covid-19: 09/10/2019, 10/03/2019, 07/13/2020  Advanced directives: Please bring a copy of your health care power of attorney and living will to the office at your convenience.  Conditions/risks identified: Yes; no goals at this time.  Next appointment: Please schedule your next Medicare Wellness Visit with your Nurse Health Advisor in 1 year by calling [phone].   Preventive Care 99 Years and Older, Female Preventive care refers to lifestyle choices and visits with your health care provider that can promote health and wellness. What does preventive care include? A yearly physical exam. This is also called an annual well check. Dental exams once or twice a year. Routine eye exams. Ask your health care provider how often you should have your eyes checked. Personal lifestyle choices, including: Daily care of your teeth and gums. Regular physical activity. Eating a healthy diet. Avoiding tobacco and drug use. Limiting alcohol use. Practicing safe sex. Taking low-dose aspirin every day. Taking vitamin and mineral supplements as recommended by your health care provider. What happens during an annual well check? The services and screenings done by your health care provider during  your annual well check will depend on your age, overall health, lifestyle risk factors, and family history of disease. Counseling  Your health care provider may ask you questions about your: Alcohol use. Tobacco use. Drug use. Emotional well-being. Home and relationship well-being. Sexual activity. Eating habits. History of falls. Memory and ability to understand (cognition). Work and work Statistician. Reproductive health. Screening  You may have the following tests or measurements: Height, weight, and BMI. Blood pressure. Lipid and cholesterol levels. These may be checked every 5 years, or more frequently if you are over 30 years old. Skin check. Lung cancer screening. You may have this screening every year starting at age 109 if you have a 30-pack-year history of smoking and currently smoke or have quit within the past 15 years. Fecal occult blood test (FOBT) of the stool. You may have this test every year starting at age 28. Flexible sigmoidoscopy or colonoscopy. You may have a sigmoidoscopy every 5 years or a colonoscopy every 10 years starting at age 37. Hepatitis C blood test. Hepatitis B blood test. Sexually transmitted disease (STD) testing. Diabetes screening. This is done by checking your blood sugar (glucose) after you have not eaten for a while (fasting). You may have this done every 1-3 years. Bone density scan. This is done to screen for osteoporosis. You may have this done starting at age 64. Mammogram. This may be done every 1-2 years. Talk to your health care provider about how often you should have regular mammograms. Talk with your health care provider about your test results, treatment options, and if necessary, the need for more tests. Vaccines  Your health care provider may recommend certain vaccines, such as: Influenza vaccine. This is recommended every year. Tetanus, diphtheria, and acellular pertussis (Tdap, Td) vaccine.  You may need a Td booster every 10  years. Zoster vaccine. You may need this after age 64. Pneumococcal 13-valent conjugate (PCV13) vaccine. One dose is recommended after age 79. Pneumococcal polysaccharide (PPSV23) vaccine. One dose is recommended after age 30. Talk to your health care provider about which screenings and vaccines you need and how often you need them. This information is not intended to replace advice given to you by your health care provider. Make sure you discuss any questions you have with your health care provider. Document Released: 08/10/2015 Document Revised: 04/02/2016 Document Reviewed: 05/15/2015 Elsevier Interactive Patient Education  2017 [place] Prevention in the Home Falls can cause injuries. They can happen to people of all ages. There are many things you can do to make your home safe and to help prevent falls. What can I do on the outside of my home? Regularly fix the edges of walkways and driveways and fix any cracks. Remove anything that might make you trip as you walk through a door, such as a raised step or threshold. Trim any bushes or trees on the path to your home. Use bright outdoor lighting. Clear any walking paths of anything that might make someone trip, such as rocks or tools. Regularly check to see if handrails are loose or broken. Make sure that both sides of any steps have handrails. Any raised decks and porches should have guardrails on the edges. Have any leaves, snow, or ice cleared regularly. Use sand or salt on walking paths during winter. Clean up any spills in your garage right away. This includes oil or grease spills. What can I do in the bathroom? Use night lights. Install grab bars by the toilet and in the tub and shower. Do not use towel bars as grab bars. Use non-skid mats or decals in the tub or shower. If you need to sit down in the shower, use a plastic, non-slip stool. Keep the floor dry. Clean up any water that spills on the floor as soon as it  happens. Remove soap buildup in the tub or shower regularly. Attach bath mats securely with double-sided non-slip rug tape. Do not have throw rugs and other things on the floor that can make you trip. What can I do in the bedroom? Use night lights. Make sure that you have a light by your bed that is easy to reach. Do not use any sheets or blankets that are too big for your bed. They should not hang down onto the floor. Have a firm chair that has side arms. You can use this for support while you get dressed. Do not have throw rugs and other things on the floor that can make you trip. What can I do in the kitchen? Clean up any spills right away. Avoid walking on wet floors. Keep items that you use a lot in easy-to-reach places. If you need to reach something above you, use a strong step stool that has a grab bar. Keep electrical cords out of the way. Do not use floor polish or wax that makes floors slippery. If you must use wax, use non-skid floor wax. Do not have throw rugs and other things on the floor that can make you trip. What can I do with my stairs? Do not leave any items on the stairs. Make sure that there are handrails on both sides of the stairs and use them. Fix handrails that are broken or loose. Make sure that handrails are as long as  the stairways. Check any carpeting to make sure that it is firmly attached to the stairs. Fix any carpet that is loose or worn. Avoid having throw rugs at the top or bottom of the stairs. If you do have throw rugs, attach them to the floor with carpet tape. Make sure that you have a light switch at the top of the stairs and the bottom of the stairs. If you do not have them, ask someone to add them for you. What else can I do to help prevent falls? Wear shoes that: Do not have high heels. Have rubber bottoms. Are comfortable and fit you well. Are closed at the toe. Do not wear sandals. If you use a stepladder: Make sure that it is fully opened.  Do not climb a closed stepladder. Make sure that both sides of the stepladder are locked into place. Ask someone to hold it for you, if possible. Clearly mark and make sure that you can see: Any grab bars or handrails. First and last steps. Where the edge of each step is. Use tools that help you move around (mobility aids) if they are needed. These include: Canes. Walkers. Scooters. Crutches. Turn on the lights when you go into a dark area. Replace any light bulbs as soon as they burn out. Set up your furniture so you have a clear path. Avoid moving your furniture around. If any of your floors are uneven, fix them. If there are any pets around you, be aware of where they are. Review your medicines with your doctor. Some medicines can make you feel dizzy. This can increase your chance of falling. Ask your doctor what other things that you can do to help prevent falls. This information is not intended to replace advice given to you by your health care provider. Make sure you discuss any questions you have with your health care provider. Document Released: 05/10/2009 Document Revised: 12/20/2015 Document Reviewed: 08/18/2014 Elsevier Interactive Patient Education  2017 [employer].

## 2021-04-05 ENCOUNTER — Encounter: Payer: Self-pay | Admitting: Licensed Clinical Social Worker

## 2021-04-05 ENCOUNTER — Telehealth (INDEPENDENT_AMBULATORY_CARE_PROVIDER_SITE_OTHER): Payer: Medicare Other | Admitting: Licensed Clinical Social Worker

## 2021-04-05 ENCOUNTER — Telehealth: Payer: Medicare Other | Admitting: Licensed Clinical Social Worker

## 2021-04-05 ENCOUNTER — Other Ambulatory Visit: Payer: Self-pay

## 2021-04-05 ENCOUNTER — Telehealth: Payer: Self-pay | Admitting: Pharmacist

## 2021-04-05 DIAGNOSIS — F4321 Adjustment disorder with depressed mood: Secondary | ICD-10-CM | POA: Diagnosis not present

## 2021-04-05 NOTE — Progress Notes (Signed)
Chronic Care Management Pharmacy Assistant   Name: [name]  MRN: [id number] DOB: 09/08/1934   Reason for Encounter: Medication Review    Recent office visits:  None ID  Recent consult visits:  03/28/21 Wells Guiles Tat, DO-Neurolgoy (parkinsonism) Medication changes:Decrease carbidopa/levodopa 25/100 to 1 tablet three times per, wk 2 Decrease carbidopa/levodopa 25/100 to 1 tablet twice per day, wk 3 Decrease carbidopa/levodopa 25/100 to 1 tablet once per day, wk 4 stop carbidopa/levodopa 25/100  02/27/21 Lyndal Pulley, DO-Sports Medicine (Primary osteoarthritis of both knees) No med changes  02/12/21 Evans Lance, MD-Cardiology Sinus node dysfunction  No med changes 02/11/21 [name], DO-Neurolgoy (Memory Loss) Med changes:Increase lexapro 10 mg daily Increase carbidopa/levodopa 25/100 to 1.5 tabs at 7am/11am/4pm x 1 week and then increase to carbidopa/levodopa 25/100, 2 tablets at 7am/11am/4pm  01/18/21 Biagio Borg, MD-Internal Medicine (Hypertension) Med changes:busPIRone (BUSPAR) 10 MG tablet  01/15/21 Lyndal Pulley, DO-Sports Medicine (Chronic pain in both knees) no med changes  01/10/21 [name], DO-Neurolgoy (Ataxia) no med changes  Hospital visits:  None in previous 6 months  Medications: Outpatient Encounter Medications as of 04/05/2021  Medication Sig   acetaminophen (TYLENOL) 500 MG tablet Take 500 mg by mouth 2 (two) times daily.   busPIRone (BUSPAR) 10 MG tablet Take 1 tablet (10 mg total) by mouth 3 (three) times daily as needed.   carbidopa-levodopa (DHIVY) 25-100 MG tablet Samples of this drug were given to the patient, quantity 30 tablets (1 bottle), Lot Number [id number] Exp: 05/27/2022.   carbidopa-levodopa (SINEMET IR) 25-100 MG tablet Take 2 tablets by mouth 3 (three) times daily.   cholecalciferol (VITAMIN D) 1000 UNITS tablet Take 2,000 Units by mouth daily.    Cyanocobalamin 1000 MCG TBCR Take 1,000 mcg by mouth daily.    denosumab  (PROLIA) 60 MG/ML SOSY injection Inject 60 mg into the skin every 6 (six) months.   Diclofenac Sodium (PENNSAID) 2 % SOLN Apply topically.   diclofenac Sodium (VOLTAREN) 1 % GEL Apply 2 g topically 4 (four) times daily.   dicyclomine (BENTYL) 10 MG capsule Take 1 capsule (10 mg total) by mouth 3 (three) times daily before meals.   escitalopram (LEXAPRO) 10 MG tablet Take 1 tablet (10 mg total) by mouth daily.   ezetimibe (ZETIA) 10 MG tablet TAKE ONE TABLET BY MOUTH EVERY MORNING   ferrous sulfate 325 (65 FE) MG tablet Take 325 mg by mouth 3 (three) times a week.   metoprolol succinate (TOPROL XL) 25 MG 24 hr tablet Take 0.5 tablets (12.5 mg total) by mouth daily.   Misc. Devices [facility]) MISC Power Wheelchair   omeprazole (PRILOSEC) 40 MG capsule TAKE ONE CAPSULE BY MOUTH EVERY MORNING and TAKE ONE CAPSULE BY MOUTH EVERY EVENING   Polyethyl Glycol-Propyl Glycol (LUBRICANT EYE DROPS) 0.4-0.3 % SOLN Place 1 drop into both eyes 3 (three) times daily as needed (dry/irritated eyes.).   polyethylene glycol (MIRALAX / GLYCOLAX) packet Take 17 g by mouth daily as needed (constipation).   sodium chloride (OCEAN) 0.65 % SOLN nasal spray Place 1 spray into both nostrils 4 (four) times daily as needed for congestion.   No facility-administered encounter medications on file as of 04/05/2021.   Reviewed chart for medication changes ahead of medication coordination call.  No OVs, Consults, or hospital visits since last care coordination call/Pharmacist visit. (If appropriate, list visit date, provider name)  No medication changes indicated OR if recent visit, treatment plan here.  BP Readings from Last 3  Encounters:  03/28/21 125/67  02/27/21 124/68  02/12/21 126/64    Lab Results  Component Value Date   HGBA1C 5.9 (H) 02/03/2020     Patient obtains medications through Vials  90 Days   Last adherence delivery included: Omeprazole 40 mg cap Take one capsule by mouth every morning and take one  capsule by mouth every evening Escitalopram 5 mg tab Take one tab by mouth once daily Metoprolol Succinate 25 mg Take 1/2 by mouth everyday at bedtime   Patient is due for next adherence delivery on: 04/15/21.  Called patient and reviewed medications and coordinated delivery. This delivery to include: Escitalopram 5 mg tab Take one tab by mouth once daily Metoprolol Succinate 25 mg Take 1/2 by mouth everyday at bedtime  Ezetimibie 10 mg 1 tab daily  Patient declined the following medications Omeprazole 40 mg patient has enough on hand and Carbidopa/levodopa is d/c  Patient needs refills for none noted.  Confirmed delivery date of 04/15/21, advised patient that pharmacy will contact them the morning of delivery.   [place] Pharmacist Assistant [phone]   Time spent:42

## 2021-04-05 NOTE — Progress Notes (Addendum)
Integrated Behavioral Health via Telemedicine Visit  04/05/2021 [name] [id number]  Number of Integrated Behavioral Health visits: 2 Session Start time: 11:00  Session End time: 11:30 Total time: 30  Referring Provider: Dr. Wells Guiles Tat Patient/Family location: At [facility] Provider location: At [facility] Neurology Office All persons participating in visit: Patient Types of Service: [place] ([facility])  I connected with [name] and/or [name]'s patient via  Geologist, engineering  (Video is Tree surgeon) and verified that I am speaking with the correct person using two identifiers. Discussed confidentiality: Yes   I discussed the limitations of telemedicine and the availability of in person appointments.  Discussed there is a possibility of technology failure and discussed alternative modes of communication if that failure occurs.  I discussed that engaging in this telemedicine visit, they consent to the provision of behavioral healthcare and the services will be billed under their insurance.  Patient and/or legal guardian expressed understanding and consented to Telemedicine visit: Yes   Presenting Concerns: Patient and/or family reports the following symptoms/concerns: Sad and anxious about making a decision for possible move to [place]  Duration of problem: Few Months; Severity of problem: moderate  Patient and/or Family's Strengths/Protective Factors: Social connections  Goals Addressed: Patient will:  Reduce symptoms of: anxiety and depression   Increase knowledge and/or ability of: coping skills and stress reduction   Demonstrate ability to: Increase healthy adjustment to current life circumstances and Increase adequate support systems for patient/family  Progress towards Goals: Ongoing  Interventions: Interventions utilized:  Motivational Interviewing, Solution-Focused Strategies,  and Supportive Counseling Standardized Assessments completed: Not Needed  Patient and/or Family Response: Pt has utilized some tools and supports from previous visit and reports will continue to do so.  Plan is for Pt to move to Assisted Living facility   Assessment: Patient currently experiencing Voiced some sadness, feelings of being anxious and depressed about potential move to assisted living facility .   Patient may benefit from Increase support and supportive counseling .  Plan: Follow up with behavioral health clinician on : As needed or in three to four weeks Behavioral recommendations: Follow up with support options provided and engage in social activities at facility  Referral(s): Community Resources:  Support Group meetings   I discussed the assessment and treatment plan with the patient and/or parent/guardian. They were provided an opportunity to ask questions and all were answered. They agreed with the plan and demonstrated an understanding of the instructions.   They were advised to call back or seek an in-person evaluation if the symptoms worsen or if the condition fails to improve as anticipated.  [name], LCSW

## 2021-04-09 ENCOUNTER — Telehealth: Payer: Self-pay

## 2021-04-09 NOTE — Telephone Encounter (Signed)
Please advise as Judeen Hammans from [facility] has called in regards to pts FL2 forms as they are needed for the new community that the pt will be moving to.   Please give Judeen Hammans a call at [phone] Fax: FL2 forms to [phone]

## 2021-04-10 ENCOUNTER — Ambulatory Visit (INDEPENDENT_AMBULATORY_CARE_PROVIDER_SITE_OTHER): Payer: Medicare Other

## 2021-04-10 ENCOUNTER — Other Ambulatory Visit: Payer: Self-pay

## 2021-04-10 DIAGNOSIS — Z111 Encounter for screening for respiratory tuberculosis: Secondary | ICD-10-CM | POA: Diagnosis not present

## 2021-04-10 NOTE — Progress Notes (Signed)
PPD has been placed on rt forearm of pt.

## 2021-04-10 NOTE — Telephone Encounter (Signed)
Forms waiting for signature.

## 2021-04-11 NOTE — Telephone Encounter (Signed)
Spoke with Sherri today and received alternate fax number.   Forms faxed.

## 2021-04-12 ENCOUNTER — Ambulatory Visit: Payer: Medicare Other

## 2021-04-12 ENCOUNTER — Other Ambulatory Visit: Payer: Self-pay

## 2021-04-12 DIAGNOSIS — Z111 Encounter for screening for respiratory tuberculosis: Secondary | ICD-10-CM

## 2021-04-12 LAB — TB SKIN TEST
Induration: 0 mm
TB Skin Test: NEGATIVE

## 2021-04-12 NOTE — Progress Notes (Signed)
PPD read with a negative result.

## 2021-04-25 NOTE — Progress Notes (Signed)
[name] [name] [name] [name] Phone: [phone] Subjective:   [name], am serving as a scribe for Dr. Hulan Saas.  This visit occurred during the SARS-CoV-2 public health emergency.  Safety protocols were in place, including screening questions prior to the visit, additional usage of staff PPE, and extensive cleaning of exam room while observing appropriate contact time as indicated for disinfecting solutions.   I'm seeing this patient by the request  of:  [name] Rail, MD  CC: Bilateral knee pain follow-up  PFX:[id number]  [name] is a 86 y.o. female coming in with complaint of B knee pain. Received Monovisc on 02/27/2021. Patient states that injections helped her knee pain. Patient has not been walking even at home so knee pain is not as bad. Patient advised not to walk by Dr. Carles [name]. Patient states that overall is not having nearly as much knee pain that she is also not doing as much activity.  Patient is very concerned that if she does too much walking she may potentially fall.  Patient denies any worsening back pain at the moment.     Past Medical History:  Diagnosis Date   Allergy    Anemia    Cancer of left breast ([place]) 1978   CHF (congestive heart failure) ([place])    Chronic thoracic back pain    "T8; fracture; 03/2015; no OR" (05/05/2016)   GERD (gastroesophageal reflux disease)    Heart murmur    History of hiatal hernia    Hyperlipidemia    Hyperplastic colon polyp    Hypertension    IBS (irritable bowel syndrome)    Multiple thyroid nodules    Osteoporosis    T8 compression fx 03/2015    Personal history of radiation therapy    Presence of permanent cardiac pacemaker    Vitamin B12 deficiency    Past Surgical History:  Procedure Laterality Date   ANTERIOR CERVICAL DECOMP/DISCECTOMY FUSION  2008   C5-6   AUGMENTATION MAMMAPLASTY     BACK SURGERY     BREAST SURGERY     CARDIAC CATHETERIZATION   2001   EP IMPLANTABLE DEVICE N/A 05/05/2016   Procedure: Pacemaker Implant;  Surgeon: Evans Lance, MD;  Location: [facility];  Service: Cardiovascular;  Laterality: N/A;   EXCISIONAL HEMORRHOIDECTOMY  1984   With subsequent correction of surgery   INSERT / REPLACE / REMOVE PACEMAKER     INTRAVASCULAR PRESSURE WIRE/FFR STUDY N/A 10/07/2019   Procedure: INTRAVASCULAR PRESSURE WIRE/FFR STUDY;  Surgeon: [name] Booze, MD;  Location: [facility];  Service: Cardiovascular;  Laterality: N/A;   KNEE ARTHROSCOPY Left    "meniscus tear"   LEFT HEART CATH AND CORONARY ANGIOGRAPHY N/A 10/07/2019   Procedure: LEFT HEART CATH AND CORONARY ANGIOGRAPHY;  Surgeon: [name] Booze, MD;  Location: [facility];  Service: Cardiovascular;  Laterality: N/A;   MASTECTOMY Left 1978   PLACEMENT OF BREAST IMPLANTS Left 1981   REDUCTION MAMMAPLASTY Right    SHOULDER ARTHROSCOPY W/ ROTATOR CUFF REPAIR Left 02/2006   "tear"   TUBAL LIGATION     Social History   Socioeconomic History   Marital status: Married    Spouse name: Not on file   Number of children: 2   Years of education: Not on file   Highest education level: Not on file  Occupational History   Occupation: retired  Tobacco Use   Smoking status: Never  Smokeless tobacco: Never  Vaping Use   Vaping Use: Never used  Substance and Sexual Activity   Alcohol use: No    Alcohol/week: 0.0 standard drinks   Drug use: No   Sexual activity: Not Currently  Other Topics Concern   Not on file  Social History Narrative   Housewife, Lives with spouse, 2 children   Social Determinants of Health   Financial Resource Strain: Low Risk    Difficulty of Paying Living Expenses: Not hard at all  Food Insecurity: No Food Insecurity   Worried About Charity fundraiser in the Last Year: Never true   Arboriculturist in the Last Year: Never true  Transportation Needs: No Transportation Needs   Lack of Transportation (Medical):  No   Lack of Transportation (Non-Medical): No  Physical Activity: Inactive   Days of Exercise per Week: 0 days   Minutes of Exercise per Session: 0 min  Stress: No Stress Concern Present   Feeling of Stress : Not at all  Social Connections: Socially Integrated   Frequency of Communication with Friends and Family: More than three times a week   Frequency of Social Gatherings with Friends and Family: More than three times a week   Attends Religious Services: 1 to 4 times per year   Active Member of [employer] or Organizations: No   Attends Music therapist: 1 to 4 times per year   Marital Status: Married   Allergies  Allergen Reactions   Statins     Lipitor caused hospitalization - - depletion of electrolytes, fever, nausea, loss of appetite, couldn't get out of bed   Cymbalta [Duloxetine Hcl] Other (See Comments)    Dulled her too much, difficulty urinating, change in vision   Fosamax [Alendronate Sodium] Other (See Comments)    Caused chronic issues swallowing   Amitriptyline     hallucinations   Lyrica [Pregabalin]     Unknown   Neurontin [Gabapentin] Other (See Comments)    Dizziness and sedation (patient is tolerating in lower dose)   Zanaflex [Tizanidine] Other (See Comments)    Could not sleep   Family History  Problem Relation Age of Onset   Stroke Sister    Diabetes Brother    Breast cancer Maternal Aunt    Atrial fibrillation Sister    Colon cancer Neg Hx    Stomach cancer Neg Hx     Current Outpatient Medications (Endocrine & Metabolic):    denosumab (PROLIA) 60 MG/ML SOSY injection, Inject 60 mg into the skin every 6 (six) months.  Current Outpatient Medications (Cardiovascular):    ezetimibe (ZETIA) 10 MG tablet, TAKE ONE TABLET BY MOUTH EVERY MORNING   metoprolol succinate (TOPROL XL) 25 MG 24 hr tablet, Take 0.5 tablets (12.5 mg total) by mouth daily.  Current Outpatient Medications (Respiratory):    sodium chloride (OCEAN) 0.65 % SOLN nasal  spray, Place 1 spray into both nostrils 4 (four) times daily as needed for congestion.  Current Outpatient Medications (Analgesics):    acetaminophen (TYLENOL) 500 MG tablet, Take 500 mg by mouth 2 (two) times daily.  Current Outpatient Medications (Hematological):    Cyanocobalamin 1000 MCG TBCR, Take 1,000 mcg by mouth daily.    ferrous sulfate 325 (65 FE) MG tablet, Take 325 mg by mouth 3 (three) times a week.  Current Outpatient Medications (Other):    busPIRone (BUSPAR) 10 MG tablet, Take 1 tablet (10 mg total) by mouth 3 (three) times daily as needed.  carbidopa-levodopa (DHIVY) 25-100 MG tablet, Samples of this drug were given to the patient, quantity 30 tablets (1 bottle), Lot Number [id number] Exp: 05/27/2022.   carbidopa-levodopa (SINEMET IR) 25-100 MG tablet, Take 2 tablets by mouth 3 (three) times daily.   cholecalciferol (VITAMIN D) 1000 UNITS tablet, Take 2,000 Units by mouth daily.    Diclofenac Sodium (PENNSAID) 2 % SOLN, Apply topically.   diclofenac Sodium (VOLTAREN) 1 % GEL, Apply 2 g topically 4 (four) times daily.   dicyclomine (BENTYL) 10 MG capsule, Take 1 capsule (10 mg total) by mouth 3 (three) times daily before meals.   escitalopram (LEXAPRO) 10 MG tablet, Take 1 tablet (10 mg total) by mouth daily.   Misc. Devices (WHEELCHAIR) MISC, Power Wheelchair   omeprazole (PRILOSEC) 40 MG capsule, TAKE ONE CAPSULE BY MOUTH EVERY MORNING and TAKE ONE CAPSULE BY MOUTH EVERY EVENING   Polyethyl Glycol-Propyl Glycol (LUBRICANT EYE DROPS) 0.4-0.3 % SOLN, Place 1 drop into both eyes 3 (three) times daily as needed (dry/irritated eyes.).   polyethylene glycol (MIRALAX / GLYCOLAX) packet, Take 17 g by mouth daily as needed (constipation).   Reviewed prior external information including notes and imaging from  primary care provider As well as notes that were available from care everywhere and other healthcare systems.  Past medical history, social, surgical and family history  all reviewed in electronic medical record.  No pertanent information unless stated regarding to the chief complaint.   Review of Systems:  No headache, visual changes, nausea, vomiting, diarrhea, constipation, dizziness, abdominal pain, skin rash, fevers, chills, night sweats, weight loss, swollen lymph nodes, joint swelling, chest pain, shortness of breath, mood changes. POSITIVE muscle aches, body aches, weakness  Objective  Blood pressure 124/66, pulse 69, height 5\' 1"  (1.549 m), SpO2 98 %.   General: No apparent distress alert and oriented x3 mood and affect normal, dressed appropriately.  Masked facies noted HEENT: Pupils equal, extraocular movements intact  Respiratory: Patient's speak in full sentences and does not appear short of breath  Cardiovascular: No lower extremity edema, non tender, no erythema  Sitting in a wheelchair accompanied with husband.  Does have some cogwheeling noted.  Neurovascular intact with palpation to the lower extremities    Impression and Recommendations:     The above documentation has been reviewed and is accurate and complete Lyndal Pulley, DO

## 2021-04-29 ENCOUNTER — Encounter: Payer: Self-pay | Admitting: Internal Medicine

## 2021-04-29 NOTE — Progress Notes (Signed)
Subjective:    Patient ID: [name], female    DOB: 1934/08/07, 86 y.o.   MRN: [id number]  This visit occurred during the SARS-CoV-2 public health emergency.  Safety protocols were in place, including screening questions prior to the visit, additional usage of staff PPE, and extensive cleaning of exam room while observing appropriate contact time as indicated for disinfecting solutions.     HPI The patient is here for follow up of their chronic medical problems, including htn, parkinson's, esophageal dysmotility, prediabetes, hld, OP, anxiety.  Her husband is with her.  They are now living at an assisted living facility.   Walking very short distances. For long distances she is in a wheelchair.  Going to be getting a motorized wheelchair.     Right upper eye lid red, swelling.  Putting on compression - warm.  It is slightly better.   Now living at [facility] and will transfer care there. That will be easier for them to get care as needed.    Medications and allergies reviewed with patient and updated if appropriate.  Patient Active Problem List   Diagnosis Date Noted   IBS (irritable bowel syndrome) 10/28/2020   Tremor of both hands 07/30/2020   Left arm swelling 04/10/2020   Aortic atherosclerosis ([place]) 03/30/2020   Cellulitis of arm, left 03/30/2020   Chronic thoracic back pain 02/03/2020   Iron deficiency anemia 02/02/2020   Near syncope 09/27/2019   Hypertension 09/20/2019   Acute pain of right knee 08/03/2019   (HFpEF) heart failure with preserved ejection fraction ([place]) 08/02/2019   Pacemaker 07/19/2019   CKD (chronic kidney disease) 01/30/2019   Lumbar spinal stenosis 01/10/2018   Degenerative arthritis of knee, bilateral 12/15/2017   Epigastric pain 09/09/2017   Neuralgia 09/09/2017   Claudication of calf muscles ([place]) 11/22/2016   DOE (dyspnea on exertion) 11/22/2016   Hyperreflexia of lower extremity 07/16/2016   Bilateral leg  paresthesia 07/16/2016   Carotid arterial disease ([place]) 07/13/2016   Poor balance 06/23/2016   Weakness of both lower extremities 06/23/2016   Sinus node dysfunction ([facility]) 05/05/2016   Right shoulder pain 03/14/2016   Multinodular goiter (nontoxic) 02/21/2016   Neck pain 02/15/2016   Prediabetes 12/17/2015   Dysphagia 12/17/2015   Anxiety 11/13/2015   T8 vertebral fracture ([place]) 04/09/2015   Overweight 10/09/2011   Allergic rhinitis, cause unspecified    GERD (gastroesophageal reflux disease) 02/05/2011   Vitamin B 12 deficiency 04/17/2009   Hyperlipidemia 04/17/2009   Osteoporosis 04/17/2009   ADENOCARCINOMA, BREAST, HX OF 04/17/2009   Constipation 03/08/2009   COLONIC POLYPS, HYPERPLASTIC, HX OF 03/06/2009    Current Outpatient Medications on File Prior to Visit  Medication Sig Dispense Refill   acetaminophen (TYLENOL) 500 MG tablet Take 500 mg by mouth 2 (two) times daily.     busPIRone (BUSPAR) 10 MG tablet Take 1 tablet (10 mg total) by mouth 3 (three) times daily as needed. 90 tablet 1   cholecalciferol (VITAMIN D) 1000 UNITS tablet Take 2,000 Units by mouth daily.      Cyanocobalamin 1000 MCG TBCR Take 1,000 mcg by mouth daily.      denosumab (PROLIA) 60 MG/ML SOSY injection Inject 60 mg into the skin every 6 (six) months.     diclofenac Sodium (VOLTAREN) 1 % GEL Apply 2 g topically 4 (four) times daily.     escitalopram (LEXAPRO) 10 MG tablet Take 1 tablet (10 mg total) by mouth daily. 90 tablet 1  ezetimibe (ZETIA) 10 MG tablet TAKE ONE TABLET BY MOUTH EVERY MORNING 90 tablet 2   ferrous sulfate 325 (65 FE) MG tablet Take 325 mg by mouth 3 (three) times a week.     metoprolol succinate (TOPROL XL) 25 MG 24 hr tablet Take 0.5 tablets (12.5 mg total) by mouth daily. 45 tablet 90   omeprazole (PRILOSEC) 40 MG capsule TAKE ONE CAPSULE BY MOUTH EVERY MORNING and TAKE ONE CAPSULE BY MOUTH EVERY EVENING 60 capsule 7   Polyethyl Glycol-Propyl Glycol (LUBRICANT EYE DROPS) 0.4-0.3  % SOLN Place 1 drop into both eyes 3 (three) times daily as needed (dry/irritated eyes.).     polyethylene glycol (MIRALAX / GLYCOLAX) packet Take 17 g by mouth daily as needed (constipation).     Misc. Devices [facility]) MISC Power Wheelchair 1 each 0   No current facility-administered medications on file prior to visit.    Past Medical History:  Diagnosis Date   Allergy    Anemia    Cancer of left breast ([place]) 1978   CHF (congestive heart failure) ([place])    Chronic thoracic back pain    "T8; fracture; 03/2015; no OR" (05/05/2016)   GERD (gastroesophageal reflux disease)    Heart murmur    History of hiatal hernia    Hyperlipidemia    Hyperplastic colon polyp    Hypertension    IBS (irritable bowel syndrome)    Multiple thyroid nodules    Osteoporosis    T8 compression fx 03/2015    Personal history of radiation therapy    Presence of permanent cardiac pacemaker    Vitamin B12 deficiency     Past Surgical History:  Procedure Laterality Date   ANTERIOR CERVICAL DECOMP/DISCECTOMY FUSION  2008   C5-6   AUGMENTATION MAMMAPLASTY     BACK SURGERY     BREAST SURGERY     CARDIAC CATHETERIZATION  2001   EP IMPLANTABLE DEVICE N/A 05/05/2016   Procedure: Pacemaker Implant;  Surgeon: Evans Lance, MD;  Location: [facility];  Service: Cardiovascular;  Laterality: N/A;   EXCISIONAL HEMORRHOIDECTOMY  1984   With subsequent correction of surgery   INSERT / REPLACE / REMOVE PACEMAKER     INTRAVASCULAR PRESSURE WIRE/FFR STUDY N/A 10/07/2019   Procedure: INTRAVASCULAR PRESSURE WIRE/FFR STUDY;  Surgeon: Jettie Booze, MD;  Location: [facility];  Service: Cardiovascular;  Laterality: N/A;   KNEE ARTHROSCOPY Left    "meniscus tear"   LEFT HEART CATH AND CORONARY ANGIOGRAPHY N/A 10/07/2019   Procedure: LEFT HEART CATH AND CORONARY ANGIOGRAPHY;  Surgeon: Jettie Booze, MD;  Location: [facility];  Service: Cardiovascular;  Laterality: N/A;   MASTECTOMY Left  1978   PLACEMENT OF BREAST IMPLANTS Left 1981   REDUCTION MAMMAPLASTY Right    SHOULDER ARTHROSCOPY W/ ROTATOR CUFF REPAIR Left 02/2006   "tear"   TUBAL LIGATION      Social History   Socioeconomic History   Marital status: Married    Spouse name: Not on file   Number of children: 2   Years of education: Not on file   Highest education level: Not on file  Occupational History   Occupation: retired  Tobacco Use   Smoking status: Never   Smokeless tobacco: Never  Vaping Use   Vaping Use: Never used  Substance and Sexual Activity   Alcohol use: No    Alcohol/week: 0.0 standard drinks   Drug use: No   Sexual activity: Not Currently  Other  Topics Concern   Not on file  Social History Narrative   Housewife, Lives with spouse, 2 children   Social Determinants of Health   Financial Resource Strain: Low Risk    Difficulty of Paying Living Expenses: Not hard at all  Food Insecurity: No Food Insecurity   Worried About Charity fundraiser in the Last Year: Never true   Arboriculturist in the Last Year: Never true  Transportation Needs: No Transportation Needs   Lack of Transportation (Medical): No   Lack of Transportation (Non-Medical): No  Physical Activity: Inactive   Days of Exercise per Week: 0 days   Minutes of Exercise per Session: 0 min  Stress: No Stress Concern Present   Feeling of Stress : Not at all  Social Connections: Socially Integrated   Frequency of Communication with Friends and Family: More than three times a week   Frequency of Social Gatherings with Friends and Family: More than three times a week   Attends Religious Services: 1 to 4 times per year   Active Member of [employer] or Organizations: No   Attends Music therapist: 1 to 4 times per year   Marital Status: Married    Family History  Problem Relation Age of Onset   Stroke Sister    Diabetes Brother    Breast cancer Maternal Aunt    Atrial fibrillation Sister    Colon cancer Neg Hx     Stomach cancer Neg Hx     Review of Systems  Constitutional:  Negative for fever.  Respiratory:  Negative for cough, shortness of breath and wheezing.   Cardiovascular:  Negative for chest pain, palpitations and leg swelling.  Neurological:  Positive for headaches (occ - right side of head). Negative for dizziness and light-headedness.      Objective:   Vitals:   04/30/21 1115  BP: 118/68  Pulse: 69  Temp: 98.1 F (36.7 C)  SpO2: 99%   BP Readings from Last 3 Encounters:  04/30/21 118/68  04/30/21 124/66  03/28/21 125/67   Wt Readings from Last 3 Encounters:  04/04/21 137 lb (62.1 kg)  03/28/21 137 lb (62.1 kg)  02/12/21 150 lb 3.2 oz (68.1 kg)   Body mass index is 25.89 kg/m.   Physical Exam    Constitutional: Appears well-developed and well-nourished. No distress.  HENT:  Head: Normocephalic and atraumatic.  Neck: Neck supple. No tracheal deviation present. No thyromegaly present.  No cervical lymphadenopathy Cardiovascular: Normal rate, regular rhythm and normal heart sounds.   No murmur heard. No carotid bruit .  No edema Pulmonary/Chest: Effort normal and breath sounds normal. No respiratory distress. No has no wheezes. No rales.  Skin: Skin is warm and dry. Not diaphoretic.  Psychiatric: Normal mood and affect. Behavior is normal.      Assessment & Plan:    See Problem List for Assessment and Plan of chronic medical problems.

## 2021-04-30 ENCOUNTER — Encounter: Payer: Self-pay | Admitting: Family Medicine

## 2021-04-30 ENCOUNTER — Ambulatory Visit (INDEPENDENT_AMBULATORY_CARE_PROVIDER_SITE_OTHER): Payer: Medicare Other | Admitting: Family Medicine

## 2021-04-30 ENCOUNTER — Ambulatory Visit (INDEPENDENT_AMBULATORY_CARE_PROVIDER_SITE_OTHER): Payer: Medicare Other

## 2021-04-30 ENCOUNTER — Other Ambulatory Visit: Payer: Self-pay

## 2021-04-30 ENCOUNTER — Ambulatory Visit (INDEPENDENT_AMBULATORY_CARE_PROVIDER_SITE_OTHER): Payer: Medicare Other | Admitting: Internal Medicine

## 2021-04-30 VITALS — BP 118/68 | HR 69 | Temp 98.1°F | Ht 61.0 in

## 2021-04-30 VITALS — BP 124/66 | HR 69 | Ht 61.0 in

## 2021-04-30 DIAGNOSIS — F419 Anxiety disorder, unspecified: Secondary | ICD-10-CM | POA: Diagnosis not present

## 2021-04-30 DIAGNOSIS — R7303 Prediabetes: Secondary | ICD-10-CM

## 2021-04-30 DIAGNOSIS — H0011 Chalazion right upper eyelid: Secondary | ICD-10-CM

## 2021-04-30 DIAGNOSIS — G8929 Other chronic pain: Secondary | ICD-10-CM

## 2021-04-30 DIAGNOSIS — I495 Sick sinus syndrome: Secondary | ICD-10-CM

## 2021-04-30 DIAGNOSIS — Z23 Encounter for immunization: Secondary | ICD-10-CM | POA: Diagnosis not present

## 2021-04-30 DIAGNOSIS — M810 Age-related osteoporosis without current pathological fracture: Secondary | ICD-10-CM

## 2021-04-30 DIAGNOSIS — M170 Bilateral primary osteoarthritis of knee: Secondary | ICD-10-CM

## 2021-04-30 DIAGNOSIS — R29898 Other symptoms and signs involving the musculoskeletal system: Secondary | ICD-10-CM

## 2021-04-30 DIAGNOSIS — M25562 Pain in left knee: Secondary | ICD-10-CM | POA: Diagnosis not present

## 2021-04-30 DIAGNOSIS — M25561 Pain in right knee: Secondary | ICD-10-CM | POA: Diagnosis not present

## 2021-04-30 DIAGNOSIS — E782 Mixed hyperlipidemia: Secondary | ICD-10-CM | POA: Diagnosis not present

## 2021-04-30 DIAGNOSIS — I10 Essential (primary) hypertension: Secondary | ICD-10-CM | POA: Diagnosis not present

## 2021-04-30 LAB — CUP PACEART REMOTE DEVICE CHECK
Battery Remaining Longevity: 114 mo
Battery Remaining Percentage: 100 %
Brady Statistic RA Percent Paced: 56 %
Brady Statistic RV Percent Paced: 0 %
Date Time Interrogation Session: 20221004030100
Implantable Lead Implant Date: 20171009
Implantable Lead Implant Date: 20171009
Implantable Lead Location: 753859
Implantable Lead Location: 753860
Implantable Lead Model: 5076
Implantable Lead Model: 7741
Implantable Lead Serial Number: 785430
Implantable Pulse Generator Implant Date: 20171009
Lead Channel Impedance Value: 1768 Ohm
Lead Channel Impedance Value: 385 Ohm
Lead Channel Pacing Threshold Amplitude: 2.1 V
Lead Channel Pacing Threshold Pulse Width: 0.4 ms
Lead Channel Setting Pacing Amplitude: 2 V
Lead Channel Setting Pacing Amplitude: 2.5 V
Lead Channel Setting Pacing Pulse Width: 0.4 ms
Lead Channel Setting Sensing Sensitivity: 2.5 mV
Pulse Gen Serial Number: 766467

## 2021-04-30 NOTE — Patient Instructions (Addendum)
   Flu immunization administered today.       Medications changes include :  none

## 2021-04-30 NOTE — Assessment & Plan Note (Signed)
Chronic Improved with lexparo 10 mg daily Not currently taking buspar but can take this as needed or regularly

## 2021-04-30 NOTE — Assessment & Plan Note (Signed)
Chronic Continue zetia 10 mg daily

## 2021-04-30 NOTE — Assessment & Plan Note (Signed)
Chronic Lab Results  Component Value Date   HGBA1C 5.9 (H) 02/03/2020

## 2021-04-30 NOTE — Assessment & Plan Note (Signed)
Patient does have weakness noted in the lower extremities with poor balance.  Patient does have significant deconditioning.  He does have Parkinson's and per notes he has a pain to be getting closer to end-stage.  Patient is being fitted for a motorized wheelchair which I think is a good idea but at the same time he would like patient to keep some muscular benefit as well.  Discussed with patient about the possibility of aquatic therapy.  Patient will consider this and hopefully will continue in the long run.  Depending on how patient is doing with this then we will talk about land therapy thereafter.  Patient is in assisted living facility.  Do not know how much significant improvement patient will be able to go to stand out any progression of weakness as well as help with balance and coordination to decrease risk of fall.  Total time talking with patient as well as husband 32 minutes.

## 2021-04-30 NOTE — Assessment & Plan Note (Signed)
Chronic dexa up to date On prolia Q 6 months - continue Had injection recently Continue vitamin d

## 2021-04-30 NOTE — Patient Instructions (Signed)
Aquatic therapy at [facility] will call you See me in 2 months

## 2021-04-30 NOTE — Assessment & Plan Note (Signed)
Chronic BP well controlled Continue Metoprolol xl 12.5 mg daily

## 2021-04-30 NOTE — Assessment & Plan Note (Signed)
Significant arthritic changes with significant other comorbidities.  Patient is not a surgical candidate for multiple different reasons.  Patient is an increase in fall risk secondary to the Parkinson's as well as the arthritic changes.  Discussed with patient at great length as well as with her husband that I do agree that a motorized wheelchair would be beneficial but at the same time to see if there is anything else we can do to help her with some balance and coordination that will help her have confidence with at least mild walking.  Patient will be referred to aquatic therapy.  Patient will follow up with me again in 2 months and could potentially do virtual.

## 2021-05-02 DIAGNOSIS — F411 Generalized anxiety disorder: Secondary | ICD-10-CM | POA: Diagnosis not present

## 2021-05-07 ENCOUNTER — Other Ambulatory Visit: Payer: Self-pay | Admitting: Internal Medicine

## 2021-05-08 NOTE — Progress Notes (Signed)
Remote pacemaker transmission.   

## 2021-05-16 DIAGNOSIS — K219 Gastro-esophageal reflux disease without esophagitis: Secondary | ICD-10-CM | POA: Diagnosis not present

## 2021-05-16 DIAGNOSIS — M810 Age-related osteoporosis without current pathological fracture: Secondary | ICD-10-CM | POA: Diagnosis not present

## 2021-05-16 DIAGNOSIS — I509 Heart failure, unspecified: Secondary | ICD-10-CM | POA: Diagnosis not present

## 2021-05-16 DIAGNOSIS — D509 Iron deficiency anemia, unspecified: Secondary | ICD-10-CM | POA: Diagnosis not present

## 2021-05-16 DIAGNOSIS — F32A Depression, unspecified: Secondary | ICD-10-CM | POA: Diagnosis not present

## 2021-05-16 DIAGNOSIS — F0390 Unspecified dementia without behavioral disturbance: Secondary | ICD-10-CM | POA: Diagnosis not present

## 2021-05-16 DIAGNOSIS — E785 Hyperlipidemia, unspecified: Secondary | ICD-10-CM | POA: Diagnosis not present

## 2021-05-16 DIAGNOSIS — E559 Vitamin D deficiency, unspecified: Secondary | ICD-10-CM | POA: Diagnosis not present

## 2021-05-16 DIAGNOSIS — E538 Deficiency of other specified B group vitamins: Secondary | ICD-10-CM | POA: Diagnosis not present

## 2021-05-20 ENCOUNTER — Encounter (HOSPITAL_BASED_OUTPATIENT_CLINIC_OR_DEPARTMENT_OTHER): Payer: Self-pay | Admitting: Physical Therapy

## 2021-05-20 ENCOUNTER — Other Ambulatory Visit: Payer: Self-pay

## 2021-05-20 ENCOUNTER — Encounter (HOSPITAL_BASED_OUTPATIENT_CLINIC_OR_DEPARTMENT_OTHER): Payer: Medicare Other | Attending: Internal Medicine | Admitting: Physical Therapy

## 2021-05-20 DIAGNOSIS — M25562 Pain in left knee: Secondary | ICD-10-CM | POA: Diagnosis not present

## 2021-05-20 DIAGNOSIS — M6281 Muscle weakness (generalized): Secondary | ICD-10-CM | POA: Diagnosis not present

## 2021-05-20 DIAGNOSIS — G8929 Other chronic pain: Secondary | ICD-10-CM | POA: Insufficient documentation

## 2021-05-20 DIAGNOSIS — M25561 Pain in right knee: Secondary | ICD-10-CM | POA: Diagnosis not present

## 2021-05-20 DIAGNOSIS — R2689 Other abnormalities of gait and mobility: Secondary | ICD-10-CM | POA: Diagnosis not present

## 2021-05-20 DIAGNOSIS — R2681 Unsteadiness on feet: Secondary | ICD-10-CM | POA: Insufficient documentation

## 2021-05-20 NOTE — Therapy (Signed)
OUTPATIENT PHYSICAL THERAPY LOWER EXTREMITY EVALUATION   Patient Name: [name] MRN: [id number] DOB:1935-01-30, 86 y.o., female Today's Date: 05/20/2021    Past Medical History:  Diagnosis Date   Allergy    Anemia    Cancer of left breast ([facility]) 1978   CHF (congestive heart failure) ([place])    Chronic thoracic back pain    "T8; fracture; 03/2015; no OR" (05/05/2016)   GERD (gastroesophageal reflux disease)    Heart murmur    History of hiatal hernia    Hyperlipidemia    Hyperplastic colon polyp    Hypertension    IBS (irritable bowel syndrome)    Multiple thyroid nodules    Osteoporosis    T8 compression fx 03/2015    Personal history of radiation therapy    Presence of permanent cardiac pacemaker    Vitamin B12 deficiency    Past Surgical History:  Procedure Laterality Date   ANTERIOR CERVICAL DECOMP/DISCECTOMY FUSION  2008   C5-6   AUGMENTATION MAMMAPLASTY     BACK SURGERY     BREAST SURGERY     CARDIAC CATHETERIZATION  2001   EP IMPLANTABLE DEVICE N/A 05/05/2016   Procedure: Pacemaker Implant;  Surgeon: Evans Lance, MD;  Location: [facility];  Service: Cardiovascular;  Laterality: N/A;   EXCISIONAL HEMORRHOIDECTOMY  1984   With subsequent correction of surgery   INSERT / REPLACE / REMOVE PACEMAKER     INTRAVASCULAR PRESSURE WIRE/FFR STUDY N/A 10/07/2019   Procedure: INTRAVASCULAR PRESSURE WIRE/FFR STUDY;  Surgeon: Jettie Booze, MD;  Location: [facility];  Service: Cardiovascular;  Laterality: N/A;   KNEE ARTHROSCOPY Left    "meniscus tear"   LEFT HEART CATH AND CORONARY ANGIOGRAPHY N/A 10/07/2019   Procedure: LEFT HEART CATH AND CORONARY ANGIOGRAPHY;  Surgeon: Jettie Booze, MD;  Location: [facility];  Service: Cardiovascular;  Laterality: N/A;   MASTECTOMY Left 1978   PLACEMENT OF BREAST IMPLANTS Left 1981   REDUCTION MAMMAPLASTY Right    SHOULDER ARTHROSCOPY W/ ROTATOR CUFF REPAIR Left 02/2006   "tear"   TUBAL  LIGATION     Patient Active Problem List   Diagnosis Date Noted   IBS (irritable bowel syndrome) 10/28/2020   Tremor of both hands 07/30/2020   Left arm swelling 04/10/2020   Aortic atherosclerosis ([place]) 03/30/2020   Cellulitis of arm, left 03/30/2020   Chronic thoracic back pain 02/03/2020   Iron deficiency anemia 02/02/2020   Near syncope 09/27/2019   Hypertension 09/20/2019   Acute pain of right knee 08/03/2019   (HFpEF) heart failure with preserved ejection fraction ([place]) 08/02/2019   Pacemaker 07/19/2019   CKD (chronic kidney disease) 01/30/2019   Lumbar spinal stenosis 01/10/2018   Degenerative arthritis of knee, bilateral 12/15/2017   Epigastric pain 09/09/2017   Neuralgia 09/09/2017   Claudication of calf muscles ([place]) 11/22/2016   DOE (dyspnea on exertion) 11/22/2016   Hyperreflexia of lower extremity 07/16/2016   Bilateral leg paresthesia 07/16/2016   Carotid arterial disease ([place]) 07/13/2016   Poor balance 06/23/2016   Weakness of both lower extremities 06/23/2016   Sinus node dysfunction ([place]) 05/05/2016   Right shoulder pain 03/14/2016   Multinodular goiter (nontoxic) 02/21/2016   Neck pain 02/15/2016   Prediabetes 12/17/2015   Dysphagia 12/17/2015   Anxiety 11/13/2015   T8 vertebral fracture ([place]) 04/09/2015   Overweight 10/09/2011   Allergic rhinitis, cause unspecified    GERD (gastroesophageal reflux disease) 02/05/2011   Vitamin B 12 deficiency 04/17/2009  Hyperlipidemia 04/17/2009   Osteoporosis 04/17/2009   ADENOCARCINOMA, BREAST, HX OF 04/17/2009   Constipation 03/08/2009   COLONIC POLYPS, HYPERPLASTIC, HX OF 03/06/2009    PCP: [name], MD  REFERRING PROVIDER: Dr Charlann Boxer   REFERRING DIAG: M25.561,M25.562,G89.29 (ICD-10-CM) - Chronic pain of both knees  THERAPY DIAG:  Bilateral knee pain; frequent falls; gait abnormality  ONSET DATE: Several years   SUBJECTIVE:   SUBJECTIVE STATEMENT: Patient's mobility has been declining for  some time. In February she fell 2x. Since that point she has been standing and walking less. She has bilateral knee pain. She has significant knee pain R> L as well. Her neurologist has suggested that she not walk. Dr Tamala Julian is concerned that if that happens she will have a consistent decline in her mobility. She would like to try pool therapy.    PERTINENT HISTORY: CHF, compression fracture of t-8; pacemaker ; B12 deficiency; anterior cervical decompression;  osteopetrosis;     PAIN:  Are you having pain? Yes VAS scale: 5/10 Pain location: both knees  Pain orientation: Right and Left  PAIN TYPE: aching Pain description: intermittent  Aggravating factors: standing and walking Relieving factors: not walking   PRECAUTIONS: Fall High fall risk   WEIGHT BEARING RESTRICTIONS No  Vitals: 65 Hr 99 Sao2 B/P 133/64  FALLS:  Has patient fallen in last 6 months? Yes, Number of falls: Nothing since February 2nd to not walking at this time but was having frequent fall sat that time   LIVING ENVIRONMENT: Lives with: lives with their family and lives in an assisted living facility Lives in: House/apartment Stairs: No;  Has following equipment at home: Gilford Rile - 2 wheeled will be obtaining an Radiation protection practitioner   OCCUPATION: retired  PLOF: Needs assistance with ADLs, Needs assistance with homemaking, and Needs assistance with gait  PATIENT GOALS   To be able to move better    OBJECTIVE:   DIAGNOSTIC FINDINGS:   PATIENT SURVEYS:  FOTO 35% ability  35% expected   COGNITION:  Overall cognitive status: Within functional limits for tasks assessed     SENSATION:  Light touch: Appears intact  Stereognosis: Appears intact  Hot/Cold: Appears intact  Proprioception: Appears intact Loses her breath with transfers    POSTURE:  Forward head and rounded shoulders   LE AROM/PROM: Did ot get patient into a supine position 2nd to difficulty transfering   LE MMT:  MMT  Right 05/20/2021 Left 05/20/2021  Hip flexion 4/5 4/5  Hip extension    Hip abduction    Hip adduction 4/5 4/5  Hip internal rotation    Hip external rotation    Knee flexion 4/5 4/5  Knee extension 4/5 4/5  Ankle dorsiflexion    Ankle plantarflexion    Ankle inversion    Ankle eversion     (Blank rows = not tested)   FUNCTIONAL TESTS:  Could use the BERG in the future   Sit to stand min A: had significant tremor and had to sit down after about 5 seconds. Once she was sitting she did better   GAIT: Distance walked: 5 feet and began to have increased tremors and fatigue. Patient was SOB with ambulation with walker. Shuffling gait     TODAY'S TREATMENT: Reviewed procedure int he pool. Patient has an HEP she does at home.    PATIENT EDUCATION:  Education details: benefits of aquatic therapy;  Person educated: Patient Education method: Explanation, Demonstration, Tactile cues, Verbal cues, and Handouts Education comprehension:  verbalized understanding, returned demonstration, verbal cues required, tactile cues required, and needs further education   HOME EXERCISE PROGRAM: Patient has an established exercise program. Time was limited 2nd to extensive history and FOTO  ASSESSMENT:  CLINICAL IMPRESSION: Patient is an 86 y.o. female who presents with bilateral knee pain and progressive loss of mobility. She requires assist with transfer. She is a high fall risk. She had increased difficulty maintaining balance while standing 2nd to tremors. She fatigued quickly. She only ambulated 5' before becoming short of breath. She would like to try pool therapy. She has increased knee and back pain standing outside the pool. She has a pacemaker as well. We will trial the pool if safe for her to get in and out of.   Abnormal gait, cardiopulmonary status limiting activity, decreased activity tolerance, decreased balance, decreased coordination, decreased endurance, decreased knowledge of  use of DME, decreased mobility, difficulty walking, decreased strength, impaired perceived functional ability, improper body mechanics, and pain  cleaning, community activity, meal prep, laundry, yard work, and shopping  Age, Behavior pattern, Time since onset of injury/illness/exacerbation, and 3+ comorbidities: Pacemaker, anxiety, Parkinson's; severe OA of the knees  REHAB POTENTIAL: Fair significant tremors and limited ability to transfer; also limited by cardio pulmonary status  CLINICAL DECISION MAKING: Unstable/unpredictable significant tremors with limited   EVALUATION COMPLEXITY: High   GOALS: Goals reviewed with patient? Yes  SHORT TERM GOALS:  STG Name Target Date Goal status  1 Patient will stand for 1 minute without increased tremors or fatigue  Baseline:  06/18/2021 INITIAL  2 Patient will ambulate 53' without increased pain in back and knee and increased fatigue  Baseline:  06/18/2021 INITIAL  3 Patient will transfer sit to stand with min guard  Baseline: 06/18/2021 INITIAL  LONG TERM GOALS:   LTG Name Target Date Goal status  1 Patient and husband will be comfortable getting patient in and out of the pool in order to be comfortable continuing program at their ALF  Baseline: 07/16/2021 INITIAL  2 Patient will ambulate 100' without fatigue and pain with RW in order to ambualte in her household   Baseline: 07/02/2021 INITIAL  3 Patient wil transfer independently in order to use her electric wheel  Baseline: 07/16/2021 INITIAL  PLAN: PT FREQUENCY: 2x/week  PT DURATION: 8 weeks  PLANNED INTERVENTIONS: Therapeutic exercises, Therapeutic activity, Neuro Muscular re-education, Balance training, Gait training, Patient/Family education, Joint mobilization, Stair training, Aquatic Therapy, Cryotherapy, Moist heat, Taping, and Manual therapy  PLAN FOR NEXT SESSION: use chair lift to get into the pool. Work on basic transfers    Carney Living PT DPT  05/20/2021, 2:38  PM

## 2021-05-21 ENCOUNTER — Encounter (HOSPITAL_BASED_OUTPATIENT_CLINIC_OR_DEPARTMENT_OTHER): Payer: Self-pay | Admitting: Physical Therapy

## 2021-05-28 ENCOUNTER — Ambulatory Visit: Payer: Medicare Other | Admitting: Pharmacist

## 2021-05-28 ENCOUNTER — Other Ambulatory Visit: Payer: Self-pay

## 2021-05-28 DIAGNOSIS — E559 Vitamin D deficiency, unspecified: Secondary | ICD-10-CM | POA: Diagnosis not present

## 2021-05-28 DIAGNOSIS — D508 Other iron deficiency anemias: Secondary | ICD-10-CM | POA: Diagnosis not present

## 2021-05-28 DIAGNOSIS — I10 Essential (primary) hypertension: Secondary | ICD-10-CM

## 2021-05-28 DIAGNOSIS — E782 Mixed hyperlipidemia: Secondary | ICD-10-CM

## 2021-05-28 DIAGNOSIS — D519 Vitamin B12 deficiency anemia, unspecified: Secondary | ICD-10-CM | POA: Diagnosis not present

## 2021-05-28 DIAGNOSIS — M810 Age-related osteoporosis without current pathological fracture: Secondary | ICD-10-CM

## 2021-05-28 DIAGNOSIS — E08311 Diabetes mellitus due to underlying condition with unspecified diabetic retinopathy with macular edema: Secondary | ICD-10-CM | POA: Diagnosis not present

## 2021-05-28 DIAGNOSIS — G20 Parkinson's disease: Secondary | ICD-10-CM

## 2021-05-28 NOTE — Patient Instructions (Signed)
Visit Information  Phone number for Pharmacist: [phone]   Goals Addressed             This Visit's Progress    COMPLETED: Manage My Medicine       Timeframe:  Long-Range Goal Priority:  Medium Start Date:     12/19/20                        Expected End Date: 05/28/21                     - call for medicine refill 2 or 3 days before it runs out - call if I am sick and can't take my medicine - keep a list of all the medicines I take; vitamins and herbals too - Allow assisted living to manage medications with pill packs   Why is this important?   These steps will help you keep on track with your medicines.   Notes:         Care Plan : [place]  Updates made by Charlton Haws, [place] since 05/28/2021 12:00 AM     Problem: Hypertension, Hyperlipidemia, Heart Failure, GERD, Depression, Osteoporosis, and IBS, Parkinson's disease Resolved 05/28/2021  Priority: High     Long-Range Goal: Disease management Completed 05/28/2021  Start Date: 10/29/2020  Expected End Date: 04/30/2021  Recent Progress: On track  Priority: High  Note:   Current Barriers:  Unable to independently monitor therapeutic efficacy  Pharmacist Clinical Goal(s):  Patient will achieve adherence to monitoring guidelines and medication adherence to achieve therapeutic efficacy  Interventions: 1:1 collaboration with [name], MD regarding development and update of comprehensive plan of care as evidenced by provider attestation and co-signature Inter-disciplinary care team collaboration (see longitudinal plan of care) Comprehensive medication review performed; medication list updated in electronic medical record  Heart Failure / Hypertension    Type: Diastolic, grade 2 dysfunction Last ejection fraction: 60-65% (11/21/2016)  Patient checks BP at home several times per month Patient home BP readings are ranging: 113/48 - 140/56, HR 60s    Patient has failed these meds in past:  isosorbide MN Patient is currently controlled on the following medications:  Metoprolol succinate 25 mg - 1/2 tab daily AM Furosemide 20 mg - has not used in months   We discussed: pt endorses compliance with metoprolol; she has not needed furosemide in months; she is living in assisted living with husband now and denies recent falls  Plan: Continue current medications   Hyperlipidemia    LDL goal < 100  Patient has failed these meds in past: atorvastatin (caused hospitalization) Patient is currently uncontrolled on the following medications:  Ezetimibe 10 mg daily AM   We discussed:  cholesterol goals; pt has not had lipid panel checked since she has been consistently taking ezetimibe; she has now changed PCP   Plan: Continue current medications    Depression  Patient has failed these meds in past: none Patient is currently controlled on the following medications:  Escitalopram 10 mg daily Buspirone 10 mg TID - not taking  We discussed:  Pt reports some improvement in mood since starting escitalopram; she has not been taking Buspar; advised she can use buspar PRN for anxiety  Plan: Continue current medications  Parkinson's Disease  Dx 07/2020 per neurology Patient has failed these meds in past: carbidopa  We discussed:  Pt is not taking levodopa currently; she follows regularly with Dr Carles Collet;  advised she discuss alternatives at next visit  Plan: Pt to discuss treatment strategies with Dr Tat  GERD    Patient is currently controlled on the following medications:  Omeprazole 40 mg BID   We discussed:  Pt reports some breakthrough symptoms but her main issuing is swallowing, she follows with GI   Plan: Continue current medications   Osteoporosis    Last DEXA Scan: 11/05/20 - improved in spine/hips compared to 2019             T-Score femoral neck: -1.9             T-Score total hip: n/a             T-Score lumbar spine: -3.3             T-Score forearm radius: n/a     Patient is a candidate for pharmacologic treatment due to T-Score < -2.5 in lumbar spine   Patient has failed these meds in past: alendronate Patient is currently controlled on the following medications:  Prolia 60 mg q6 months (last given 03/15/21, started 12/2015) Vitamin D 1000 IU - 2 cap AM   We discussed:  Recommend [phone] units of vitamin D daily. Recommend 1200 mg of calcium daily from dietary and supplemental sources.   Plan: Continue current medications  Patient Goals/Self-Care Activities Patient will:  - take medications as prescribed -focus on medication adherence by pill box -Discuss Parkinson's options with Dr Tat -Use in-house pharmacy for pill packs    Problem: Hypertension, Hyperlipidemia, Heart Failure, GERD, Depression, Osteoporosis, and IBS, Parkinson's disease   Priority: High     Long-Range Goal: Disease management   Start Date: 12/02/2020  Expected End Date: 05/28/2021  This Visit's Progress: On track  Priority: High  Note:   Current Barriers:  Unable to independently monitor therapeutic efficacy  Pharmacist Clinical Goal(s):  Patient will achieve adherence to monitoring guidelines and medication adherence to achieve therapeutic efficacy  Interventions: 1:1 collaboration with [name], MD regarding development and update of comprehensive plan of care as evidenced by provider attestation and co-signature Inter-disciplinary care team collaboration (see longitudinal plan of care) Comprehensive medication review performed; medication list updated in electronic medical record  Heart Failure / Hypertension    Type: Diastolic, grade 2 dysfunction Last ejection fraction: 60-65% (11/21/2016)  Patient checks BP at home several times per month Patient home BP readings are ranging: 113/48 - 140/56, HR 60s    Patient has failed these meds in past: isosorbide MN Patient is currently controlled on the following medications:  Metoprolol succinate 25 mg -  1/2 tab daily AM Furosemide 20 mg - has not used in months   We discussed: pt endorses compliance with metoprolol; she has not needed furosemide in months; she is living in assisted living with husband now and denies recent falls  Plan: Continue current medications   Hyperlipidemia    LDL goal < 100  Patient has failed these meds in past: atorvastatin (caused hospitalization) Patient is currently uncontrolled on the following medications:  Ezetimibe 10 mg daily AM   We discussed:  cholesterol goals; pt has not had lipid panel checked since she has been consistently taking ezetimibe; she has now changed PCP   Plan: Continue current medications    Depression  Patient has failed these meds in past: none Patient is currently controlled on the following medications:  Escitalopram 10 mg daily Buspirone 10 mg TID - not taking  We discussed:  Pt reports  some improvement in mood since starting escitalopram; she has not been taking Buspar; advised she can use buspar PRN for anxiety  Plan: Continue current medications  Parkinson's Disease  Dx 07/2020 per neurology Patient has failed these meds in past: carbidopa  We discussed:  Pt is not taking levodopa currently; she follows regularly with Dr Tat; advised she discuss alternatives at next visit  Plan: Pt to discuss treatment strategies with Dr Tat  GERD    Patient is currently controlled on the following medications:  Omeprazole 40 mg BID   We discussed:  Pt reports some breakthrough symptoms but her main issuing is swallowing, she follows with GI   Plan: Continue current medications   Osteoporosis    Last DEXA Scan: 11/05/20 - improved in spine/hips compared to 2019             T-Score femoral neck: -1.9             T-Score total hip: n/a             T-Score lumbar spine: -3.3             T-Score forearm radius: n/a    Patient is a candidate for pharmacologic treatment due to T-Score < -2.5 in lumbar spine   Patient has  failed these meds in past: alendronate Patient is currently controlled on the following medications:  Prolia 60 mg q6 months (last given 03/15/21, started 12/2015) Vitamin D 1000 IU - 2 cap AM   We discussed:  Recommend [phone] units of vitamin D daily. Recommend 1200 mg of calcium daily from dietary and supplemental sources.   Plan: Continue current medications  Patient Goals/Self-Care Activities Patient will:  - take medications as prescribed -focus on medication adherence by pill box -Discuss Parkinson's options with Dr Carles Collet -Use in-house pharmacy for pill packs      Patient verbalizes understanding of instructions provided today and agrees to view in [place].  CCM enrollment status changed to "previously enrolled" as per patient request on 05/28/21 to discontinue enrollment due to changing PCP. Case closed to case management services in primary care home.   Charlene Brooke, PharmD, [place], CPP Clinical Pharmacist [facility] [phone]

## 2021-05-28 NOTE — Progress Notes (Signed)
Chronic Care Management Pharmacy Note  05/28/2021 Name:  [name] MRN:  [id number] DOB:  [birth year]  Summary: -Pt has moved to [facility] (Assisted Living) and has switched to their on-site PCP and pharmacy (pill packs) -Pt is no longer eligible for CCM due to changing PCPs  Recommendations/Changes made from today's visit: -No med changes -Un-enroll patient from CCM due to changing PCP   Subjective: [name] is an 86 y.o. year old female who is a primary patient of Burns, Claudina Lick, MD.  The CCM team was consulted for assistance with disease management and care coordination needs.    Engaged with patient by telephone for follow up visit in response to provider referral for pharmacy case management and/or care coordination services.   Consent to Services:  The patient was given information about Chronic Care Management services, agreed to services, and gave verbal consent prior to initiation of services.  Please see initial visit note for detailed documentation.   Patient Care Team: [name], MD as PCP - General (Internal Medicine) Evans Lance, MD as PCP - Cardiology (Cardiology) Ladene Artist, MD (Gastroenterology) Servando Salina, MD (Obstetrics and Gynecology) Melida Quitter, MD as Consulting Physician (Otolaryngology) Allyn Kenner, MD (Dermatology) Berle Mull, MD (Sports Medicine) Jovita Gamma, MD as Consulting Physician (Neurosurgery) Evans Lance, MD as Consulting Physician (Cardiology) Martinique, Peter M, MD as Consulting Physician (Cardiology) Charlton Haws, [facility] as Pharmacist (Pharmacist) Tat, Eustace Quail, DO as Consulting Physician (Neurology) Knox Royalty, RN as Case Manager Lonia Skinner, MD as Consulting Physician (Ophthalmology)  Recent office visits: 04/30/21 Dr Quay Burow OV: CPE; D/C sinemet, dicyclomine d/t not taking 12/12/20 Dr Quay Burow OV: knee pain; referred to sports med 10/29/20 Dr Quay Burow OV: chronic f/u; ordered bone  density  Recent consult visits: 04/30/21 Dr Tamala Julian (sports med): f/u knee pain; referred to PT 04/05/21 Misty Taylor-Paladino LCSW (neurology): wean off levodopa; encouraged ALF; discussed permissive HTN in s/o orthostasis 03/28/21 Dr Tat (neurology): f/u Parkinsons, dropped head syndrome PT for dysarthria and muscle weakness 02/12/21 Dr Lovena Le (cardiology): f/u HF, sinus node dysfunction; advised to avoid salty foods 10/05/20 NP Lynnell Jude (cardiology): continue to hold lasix, 1/2 metoprolol dose. May consider midodrine if needed. 09/28/20 Dr Lovena Le - advised stopping lasix and cut metoprolol in half. 09/27/20 Dr Tat (neurology): f/u Parkinson's. C/o orthostasis. Referred to cardiology to change BP meds, possibly Lasix.  08/23/20 Dr Tat (neurology): Dx Parkinson's. Rx'd Levodopa. Referred for PT and speech therapy.  Hospital visits: None in previous 6 months  Objective:  Lab Results  Component Value Date   CREATININE 0.87 01/15/2021   BUN 9 01/15/2021   GFR 61.48 01/15/2021   GFRNONAA 38 (L) 08/30/2020   GFRAA 44 (L) 08/30/2020   NA 134 (L) 01/15/2021   K 4.2 01/15/2021   CALCIUM 8.7 01/15/2021   CO2 25 01/15/2021   GLUCOSE 100 (H) 01/15/2021    Lab Results  Component Value Date/Time   HGBA1C 5.9 (H) 02/03/2020 10:46 AM   HGBA1C 6.2 08/03/2019 09:49 AM   GFR 61.48 01/15/2021 02:59 PM   GFR 44.65 (L) 12/27/2019 08:10 AM    Last diabetic Eye exam: No results found for: HMDIABEYEEXA  Last diabetic Foot exam: No results found for: HMDIABFOOTEX   Lab Results  Component Value Date   CHOL 215 (H) 02/03/2020   HDL 58 02/03/2020   LDLCALC 132 (H) 02/03/2020   LDLDIRECT 164.6 12/13/2012   TRIG 133 02/03/2020   CHOLHDL 3.7 02/03/2020  Hepatic Function Latest Ref Rng & Units 01/15/2021 03/30/2020 02/03/2020  Total Protein 6.0 - 8.3 g/dL 6.8 6.8 6.5  Albumin 3.5 - 5.2 g/dL 4.3 - -  AST 0 - 37 U/L _0 ALT 0 - 35 U/L _1 Alk Phosphatase 39 - 117 U/L 34(L) - -  Total Bilirubin 0.2 -  1.2 mg/dL 0.3 0.4 0.4  Bilirubin, Direct 0.0 - 0.3 mg/dL - - -    Lab Results  Component Value Date/Time   TSH 1.85 02/03/2020 10:46 AM   TSH 2.25 12/27/2019 08:10 AM    CBC Latest Ref Rng & Units 01/15/2021 10/05/2020 03/30/2020  WBC 4.0 - 10.5 K/uL 6.7 6.2 7.8  Hemoglobin 12.0 - 15.0 g/dL 12.0 10.8(L) 12.0  Hematocrit 36.0 - 46.0 % 35.4(L) 31.5(L) 36.3  Platelets 150.0 - 400.0 K/uL 308.0 320 333    Lab Results  Component Value Date/Time   VD25OH 42 02/03/2020 10:46 AM   VD25OH 73.89 12/27/2019 08:10 AM   VD25OH 64.65 11/20/2016 12:02 PM    Clinical ASCVD: No  The ASCVD Risk score (Arnett DK, et al., 2019) failed to calculate for the following reasons:   The 2019 ASCVD risk score is only valid for ages 56 to 26    Depression screen PHQ 2/9 04/04/2021 02/03/2020 02/02/2019  Decreased Interest 0 0 0  Down, Depressed, Hopeless 0 0 0  PHQ - 2 Score 0 0 0  Some recent data might be hidden     Social History   Tobacco Use  Smoking Status Never  Smokeless Tobacco Never   BP Readings from Last 3 Encounters:  04/30/21 118/68  04/30/21 124/66  03/28/21 125/67   Pulse Readings from Last 3 Encounters:  04/30/21 69  04/30/21 69  03/28/21 72   Wt Readings from Last 3 Encounters:  04/04/21 137 lb (62.1 kg)  03/28/21 137 lb (62.1 kg)  02/12/21 150 lb 3.2 oz (68.1 kg)   BMI Readings from Last 3 Encounters:  04/30/21 25.89 kg/m  04/30/21 25.89 kg/m  04/04/21 25.89 kg/m    Assessment/Interventions: Review of patient past medical history, allergies, medications, health status, including review of consultants reports, laboratory and other test data, was performed as part of comprehensive evaluation and provision of chronic care management services.   SDOH:  (Social Determinants of Health) assessments and interventions performed: Yes  SDOH Screenings   Alcohol Screen: Low Risk    Last Alcohol Screening Score (AUDIT): 0  Depression (PHQ2-9): Low Risk    PHQ-2 Score: 0   Financial Resource Strain: Low Risk    Difficulty of Paying Living Expenses: Not hard at all  Food Insecurity: No Food Insecurity   Worried About Charity fundraiser in the Last Year: Never true   Ran Out of Food in the Last Year: Never true  Housing: Low Risk    Last Housing Risk Score: 0  Physical Activity: Inactive   Days of Exercise per Week: 0 days   Minutes of Exercise per Session: 0 min  Social Connections: Engineer, building services of Communication with Friends and Family: More than three times a week   Frequency of Social Gatherings with Friends and Family: More than three times a week   Attends Religious Services: 1 to 4 times per year   Active Member of [employer] or Organizations: No   Attends Archivist Meetings: 1 to 4 times per year   Marital Status: Married  Stress: No Stress Concern Present  Feeling of Stress : Not at all  Tobacco Use: Low Risk    Smoking Tobacco Use: Never   Smokeless Tobacco Use: Never   Passive Exposure: Not on file  Transportation Needs: No Transportation Needs   Lack of Transportation (Medical): No   Lack of Transportation (Non-Medical): No    CCM Care Plan  Allergies  Allergen Reactions   Statins     Lipitor caused hospitalization - - depletion of electrolytes, fever, nausea, loss of appetite, couldn't get out of bed   Cymbalta [Duloxetine Hcl] Other (See Comments)    Dulled her too much, difficulty urinating, change in vision   Fosamax [Alendronate Sodium] Other (See Comments)    Caused chronic issues swallowing   Amitriptyline     hallucinations   Lyrica [Pregabalin]     Unknown   Neurontin [Gabapentin] Other (See Comments)    Dizziness and sedation (patient is tolerating in lower dose)   Zanaflex [Tizanidine] Other (See Comments)    Could not sleep    Medications Reviewed Today     Reviewed by Carney Living, PT (Physical Therapist) on 05/21/21 at [place] List Status: <None>   Medication Order Taking? Sig  Documenting Provider Last Dose Status Informant  acetaminophen (TYLENOL) 500 MG tablet [id number] No Take 500 mg by mouth 2 (two) times daily. [provider] Taking Active Self  busPIRone (BUSPAR) 10 MG tablet [id number] No Take 1 tablet (10 mg total) by mouth 3 (three) times daily as needed. Biagio Borg, MD Taking Active   cholecalciferol (VITAMIN D) 1000 UNITS tablet [id number] No Take 2,000 Units by mouth daily.  [provider] Taking Active Self  Cyanocobalamin 1000 MCG TBCR [id number] No Take 1,000 mcg by mouth daily.  [provider] Taking Active Self           Med Note Tammi Klippel, Nadara Mustard   Fri May 02, 2016  9:59 AM)    denosumab (PROLIA) 60 MG/ML SOSY injection [id number] No Inject 60 mg into the skin every 6 (six) months. [provider] Taking Active   diclofenac Sodium (VOLTAREN) 1 % GEL [id number] No Apply 2 g topically 4 (four) times daily. [provider] Taking Active   escitalopram (LEXAPRO) 10 MG tablet [id number] No Take 1 tablet (10 mg total) by mouth daily. Ludwig Clarks, DO Taking Active   ezetimibe (ZETIA) 10 MG tablet [id number] No TAKE ONE TABLET BY MOUTH EVERY MORNING Burns, Claudina Lick, MD Taking Active   ferrous sulfate 325 (65 FE) MG tablet [id number] No Take 325 mg by mouth 3 (three) times a week. [provider] Taking Active   metoprolol succinate (TOPROL XL) 25 MG 24 hr tablet [id number] No Take 0.5 tablets (12.5 mg total) by mouth daily. [name], MD Taking Active   Misc. Devices [facility]) [place] [id number] No Power Wheelchair Tat, Eustace Quail, DO Taking Active   omeprazole (PRILOSEC) 40 MG capsule [id number]  TAKE 1 CAPSULE BY MOUTH TWICE A DAY FOR GERD. [name], MD  Active   Polyethyl Glycol-Propyl Glycol (LUBRICANT EYE DROPS) 0.4-0.3 % SOLN [id number] No Place 1 drop into both eyes 3 (three) times daily as needed (dry/irritated eyes.). [provider] Taking Active Self  polyethylene glycol (MIRALAX /  GLYCOLAX) packet [id number] No Take 17 g by mouth daily as needed (constipation). [provider] Taking Active Self            Patient Active Problem List   Diagnosis Date Noted  IBS (irritable bowel syndrome) 10/28/2020   Tremor of both hands 07/30/2020   Left arm swelling 04/10/2020   Aortic atherosclerosis ([place]) 03/30/2020   Cellulitis of arm, left 03/30/2020   Chronic thoracic back pain 02/03/2020   Iron deficiency anemia 02/02/2020   Near syncope 09/27/2019   Hypertension 09/20/2019   Acute pain of right knee 08/03/2019   (HFpEF) heart failure with preserved ejection fraction ([place]) 08/02/2019   Pacemaker 07/19/2019   CKD (chronic kidney disease) 01/30/2019   Lumbar spinal stenosis 01/10/2018   Degenerative arthritis of knee, bilateral 12/15/2017   Epigastric pain 09/09/2017   Neuralgia 09/09/2017   Claudication of calf muscles ([place]) 11/22/2016   DOE (dyspnea on exertion) 11/22/2016   Hyperreflexia of lower extremity 07/16/2016   Bilateral leg paresthesia 07/16/2016   Carotid arterial disease ([place]) 07/13/2016   Poor balance 06/23/2016   Weakness of both lower extremities 06/23/2016   Sinus node dysfunction (HCC) 05/05/2016   Right shoulder pain 03/14/2016   Multinodular goiter (nontoxic) 02/21/2016   Neck pain 02/15/2016   Prediabetes 12/17/2015   Dysphagia 12/17/2015   Anxiety 11/13/2015   T8 vertebral fracture ([place]) 04/09/2015   Overweight 10/09/2011   Allergic rhinitis, cause unspecified    GERD (gastroesophageal reflux disease) 02/05/2011   Vitamin B 12 deficiency 04/17/2009   Hyperlipidemia 04/17/2009   Osteoporosis 04/17/2009   ADENOCARCINOMA, BREAST, HX OF 04/17/2009   Constipation 03/08/2009   COLONIC POLYPS, HYPERPLASTIC, HX OF 03/06/2009    Immunization History  Administered Date(s) Administered   Fluad Quad(high Dose 65+) 05/17/2019, 05/26/2020, 04/30/2021   Influenza Split 06/27/2011, 06/09/2012   Influenza Whole 07/09/2009, 07/02/2010    Influenza, High Dose Seasonal PF 05/02/2013, 05/31/2014, 06/26/2015, 06/02/2016, 05/22/2017, 05/17/2018   PFIZER(Purple Top)SARS-COV-2 Vaccination 09/10/2019, 10/03/2019, 07/13/2020   PPD Test 04/10/2021   Pneumococcal Conjugate-13 06/19/2014   Pneumococcal Polysaccharide-23 07/28/2000, 10/09/2011   Td 07/28/2000   Tdap 10/15/2010   Zoster, Live 03/12/2011    Conditions to be addressed/monitored:  Hypertension, Hyperlipidemia, Heart Failure, GERD, Depression, Osteoporosis, and IBS, Parkinson's disease  Care Plan : [place]  Updates made by Charlton Haws, [place] since 05/28/2021 12:00 AM     Problem: Hypertension, Hyperlipidemia, Heart Failure, GERD, Depression, Osteoporosis, and IBS, Parkinson's disease Resolved 05/28/2021  Priority: High     Long-Range Goal: Disease management Completed 05/28/2021  Start Date: 10/29/2020  Expected End Date: 04/30/2021  Recent Progress: On track  Priority: High  Note:   Current Barriers:  Unable to independently monitor therapeutic efficacy  Pharmacist Clinical Goal(s):  Patient will achieve adherence to monitoring guidelines and medication adherence to achieve therapeutic efficacy  Interventions: 1:1 collaboration with [name], MD regarding development and update of comprehensive plan of care as evidenced by provider attestation and co-signature Inter-disciplinary care team collaboration (see longitudinal plan of care) Comprehensive medication review performed; medication list updated in electronic medical record  Heart Failure / Hypertension    Type: Diastolic, grade 2 dysfunction Last ejection fraction: 60-65% (11/21/2016)  Patient checks BP at home several times per month Patient home BP readings are ranging: 113/48 - 140/56, HR 60s    Patient has failed these meds in past: isosorbide MN Patient is currently controlled on the following medications:  Metoprolol succinate 25 mg - 1/2 tab daily AM Furosemide 20  mg - has not used in months   We discussed: pt endorses compliance with metoprolol; she has not needed furosemide in months; she is living in assisted living with husband now and denies  recent falls  Plan: Continue current medications   Hyperlipidemia    LDL goal < 100  Patient has failed these meds in past: atorvastatin (caused hospitalization) Patient is currently uncontrolled on the following medications:  Ezetimibe 10 mg daily AM   We discussed:  cholesterol goals; pt has not had lipid panel checked since she has been consistently taking ezetimibe; she has now changed PCP   Plan: Continue current medications    Depression  Patient has failed these meds in past: none Patient is currently controlled on the following medications:  Escitalopram 10 mg daily Buspirone 10 mg TID - not taking  We discussed:  Pt reports some improvement in mood since starting escitalopram; she has not been taking Buspar; advised she can use buspar PRN for anxiety  Plan: Continue current medications  Parkinson's Disease  Dx 07/2020 per neurology Patient has failed these meds in past: carbidopa  We discussed:  Pt is not taking levodopa currently; she follows regularly with Dr Carles Collet; advised she discuss alternatives at next visit  Plan: Pt to discuss treatment strategies with Dr Tat  GERD    Patient is currently controlled on the following medications:  Omeprazole 40 mg BID   We discussed:  Pt reports some breakthrough symptoms but her main issuing is swallowing, she follows with GI   Plan: Continue current medications   Osteoporosis    Last DEXA Scan: 11/05/20 - improved in spine/hips compared to 2019             T-Score femoral neck: -1.9             T-Score total hip: n/a             T-Score lumbar spine: -3.3             T-Score forearm radius: n/a    Patient is a candidate for pharmacologic treatment due to T-Score < -2.5 in lumbar spine   Patient has failed these meds in past:  alendronate Patient is currently controlled on the following medications:  Prolia 60 mg q6 months (last given 03/15/21, started 12/2015) Vitamin D 1000 IU - 2 cap AM   We discussed:  Recommend [phone] units of vitamin D daily. Recommend 1200 mg of calcium daily from dietary and supplemental sources.   Plan: Continue current medications  Patient Goals/Self-Care Activities Patient will:  - take medications as prescribed -focus on medication adherence by pill box -Discuss Parkinson's options with Dr Tat -Use in-house pharmacy for pill packs    Problem: Hypertension, Hyperlipidemia, Heart Failure, GERD, Depression, Osteoporosis, and IBS, Parkinson's disease   Priority: High     Long-Range Goal: Disease management   Start Date: 12/02/2020  Expected End Date: 05/28/2021  This Visit's Progress: On track  Priority: High  Note:   Current Barriers:  Unable to independently monitor therapeutic efficacy  Pharmacist Clinical Goal(s):  Patient will achieve adherence to monitoring guidelines and medication adherence to achieve therapeutic efficacy  Interventions: 1:1 collaboration with [name], MD regarding development and update of comprehensive plan of care as evidenced by provider attestation and co-signature Inter-disciplinary care team collaboration (see longitudinal plan of care) Comprehensive medication review performed; medication list updated in electronic medical record  Heart Failure / Hypertension    Type: Diastolic, grade 2 dysfunction Last ejection fraction: 60-65% (11/21/2016)  Patient checks BP at home several times per month Patient home BP readings are ranging: 113/48 - 140/56, HR 60s    Patient has failed these meds in  past: isosorbide MN Patient is currently controlled on the following medications:  Metoprolol succinate 25 mg - 1/2 tab daily AM Furosemide 20 mg - has not used in months   We discussed: pt endorses compliance with metoprolol; she has not needed  furosemide in months; she is living in assisted living with husband now and denies recent falls  Plan: Continue current medications   Hyperlipidemia    LDL goal < 100  Patient has failed these meds in past: atorvastatin (caused hospitalization) Patient is currently uncontrolled on the following medications:  Ezetimibe 10 mg daily AM   We discussed:  cholesterol goals; pt has not had lipid panel checked since she has been consistently taking ezetimibe; she has now changed PCP   Plan: Continue current medications    Depression  Patient has failed these meds in past: none Patient is currently controlled on the following medications:  Escitalopram 10 mg daily Buspirone 10 mg TID - not taking  We discussed:  Pt reports some improvement in mood since starting escitalopram; she has not been taking Buspar; advised she can use buspar PRN for anxiety  Plan: Continue current medications  Parkinson's Disease  Dx 07/2020 per neurology Patient has failed these meds in past: carbidopa  We discussed:  Pt is not taking levodopa currently; she follows regularly with Dr Carles Collet; advised she discuss alternatives at next visit  Plan: Pt to discuss treatment strategies with Dr Tat  GERD    Patient is currently controlled on the following medications:  Omeprazole 40 mg BID   We discussed:  Pt reports some breakthrough symptoms but her main issuing is swallowing, she follows with GI   Plan: Continue current medications   Osteoporosis    Last DEXA Scan: 11/05/20 - improved in spine/hips compared to 2019             T-Score femoral neck: -1.9             T-Score total hip: n/a             T-Score lumbar spine: -3.3             T-Score forearm radius: n/a    Patient is a candidate for pharmacologic treatment due to T-Score < -2.5 in lumbar spine   Patient has failed these meds in past: alendronate Patient is currently controlled on the following medications:  Prolia 60 mg q6 months (last given  03/15/21, started 12/2015) Vitamin D 1000 IU - 2 cap AM   We discussed:  Recommend [phone] units of vitamin D daily. Recommend 1200 mg of calcium daily from dietary and supplemental sources.   Plan: Continue current medications  Patient Goals/Self-Care Activities Patient will:  - take medications as prescribed -focus on medication adherence by pill box -Discuss Parkinson's options with Dr Carles Collet -Use in-house pharmacy for pill packs     Medication Assistance: None required.  Patient affirms current coverage meets needs.  Compliance/Adherence/Medication fill history: Care Gaps: None  Star-Rating Drugs: None  Patient's preferred pharmacy is:  [facility] [place], [place] - [address] [place] [address] Phone: [phone] Fax: [phone]  [place], [place] [address] [place] [address] Phone: [phone] Fax: [phone]  Uses pill box? Yes (bubble packs at assisted living) Pt endorses 100% compliance  We discussed: Pt has moved to [place] assisted living and they take care of her medications using bubble packs Patient  decided to: Continue current medication management strategy  Care Plan and Follow Up Patient Decision:  Patient agrees to Care Plan and Follow-up.  Plan: Telephone follow up appointment with care management team member scheduled for:  6 months  Charlene Brooke, PharmD, [place], CPP Clinical Pharmacist [facility] [phone]

## 2021-05-29 ENCOUNTER — Ambulatory Visit (INDEPENDENT_AMBULATORY_CARE_PROVIDER_SITE_OTHER): Payer: Medicare Other | Admitting: *Deleted

## 2021-05-29 DIAGNOSIS — I10 Essential (primary) hypertension: Secondary | ICD-10-CM

## 2021-05-29 DIAGNOSIS — E782 Mixed hyperlipidemia: Secondary | ICD-10-CM

## 2021-05-29 DIAGNOSIS — G20 Parkinson's disease: Secondary | ICD-10-CM

## 2021-05-29 NOTE — Chronic Care Management (AMB) (Signed)
Chronic Care Management   CCM RN Visit Note  05/29/2021 Name: [name] MRN: [id number] DOB: 10-31-1934  Subjective: [name] is a 86 y.o. year old female who is a primary care patient of Burns, Claudina Lick, MD. The care management team was consulted for assistance with disease management and care coordination needs.    Collaboration with [place] team  for  case closure  in response to provider referral for case management and/or care coordination services.   Consent to Services:  The patient was given information about Chronic Care Management services, agreed to services, and gave verbal consent prior to initiation of services.  Please see initial visit note for detailed documentation.  Patient agreed to services and verbal consent obtained.   Assessment: Review of patient past medical history, allergies, medications, health status, including review of consultants reports, laboratory and other test data, was performed as part of comprehensive evaluation and provision of chronic care management services.   CCM Care Plan Allergies  Allergen Reactions   Statins     Lipitor caused hospitalization - - depletion of electrolytes, fever, nausea, loss of appetite, couldn't get out of bed   Cymbalta [Duloxetine Hcl] Other (See Comments)    Dulled her too much, difficulty urinating, change in vision   Fosamax [Alendronate Sodium] Other (See Comments)    Caused chronic issues swallowing   Amitriptyline     hallucinations   Lyrica [Pregabalin]     Unknown   Neurontin [Gabapentin] Other (See Comments)    Dizziness and sedation (patient is tolerating in lower dose)   Zanaflex [Tizanidine] Other (See Comments)    Could not sleep    Outpatient Encounter Medications as of 05/29/2021  Medication Sig   acetaminophen (TYLENOL) 500 MG tablet Take 500 mg by mouth 2 (two) times daily.   busPIRone (BUSPAR) 10 MG tablet Take 1 tablet (10 mg total) by mouth 3 (three) times daily as  needed.   cholecalciferol (VITAMIN D) 1000 UNITS tablet Take 2,000 Units by mouth daily.    Cyanocobalamin 1000 MCG TBCR Take 1,000 mcg by mouth daily.    denosumab (PROLIA) 60 MG/ML SOSY injection Inject 60 mg into the skin every 6 (six) months.   diclofenac Sodium (VOLTAREN) 1 % GEL Apply 2 g topically 4 (four) times daily.   escitalopram (LEXAPRO) 10 MG tablet Take 1 tablet (10 mg total) by mouth daily.   ezetimibe (ZETIA) 10 MG tablet TAKE ONE TABLET BY MOUTH EVERY MORNING   ferrous sulfate 325 (65 FE) MG tablet Take 325 mg by mouth 3 (three) times a week.   metoprolol succinate (TOPROL XL) 25 MG 24 hr tablet Take 0.5 tablets (12.5 mg total) by mouth daily.   Misc. Devices [facility]) MISC Power Wheelchair   omeprazole (PRILOSEC) 40 MG capsule TAKE 1 CAPSULE BY MOUTH TWICE A DAY FOR GERD.   Polyethyl Glycol-Propyl Glycol (LUBRICANT EYE DROPS) 0.4-0.3 % SOLN Place 1 drop into both eyes 3 (three) times daily as needed (dry/irritated eyes.).   polyethylene glycol (MIRALAX / GLYCOLAX) packet Take 17 g by mouth daily as needed (constipation).   No facility-administered encounter medications on file as of 05/29/2021.    Patient Active Problem List   Diagnosis Date Noted   IBS (irritable bowel syndrome) 10/28/2020   Tremor of both hands 07/30/2020   Left arm swelling 04/10/2020   Aortic atherosclerosis ([place]) 03/30/2020   Cellulitis of arm, left 03/30/2020   Chronic thoracic back pain 02/03/2020   Iron deficiency anemia  02/02/2020   Near syncope 09/27/2019   Hypertension 09/20/2019   Acute pain of right knee 08/03/2019   (HFpEF) heart failure with preserved ejection fraction ([place]) 08/02/2019   Pacemaker 07/19/2019   CKD (chronic kidney disease) 01/30/2019   Lumbar spinal stenosis 01/10/2018   Degenerative arthritis of knee, bilateral 12/15/2017   Epigastric pain 09/09/2017   Neuralgia 09/09/2017   Claudication of calf muscles ([place]) 11/22/2016   DOE (dyspnea on exertion) 11/22/2016    Hyperreflexia of lower extremity 07/16/2016   Bilateral leg paresthesia 07/16/2016   Carotid arterial disease ([place]) 07/13/2016   Poor balance 06/23/2016   Weakness of both lower extremities 06/23/2016   Sinus node dysfunction (HCC) 05/05/2016   Right shoulder pain 03/14/2016   Multinodular goiter (nontoxic) 02/21/2016   Neck pain 02/15/2016   Prediabetes 12/17/2015   Dysphagia 12/17/2015   Anxiety 11/13/2015   T8 vertebral fracture ([place]) 04/09/2015   Overweight 10/09/2011   Allergic rhinitis, cause unspecified    GERD (gastroesophageal reflux disease) 02/05/2011   Vitamin B 12 deficiency 04/17/2009   Hyperlipidemia 04/17/2009   Osteoporosis 04/17/2009   ADENOCARCINOMA, BREAST, HX OF 04/17/2009   Constipation 03/08/2009   COLONIC POLYPS, HYPERPLASTIC, HX OF 03/06/2009    Conditions to be addressed/monitored:  HTN, HLD, and Parkinson's Disease  Care Plan : RN Care Manager Plan of Care  Updates made by Knox Royalty, RN since 05/29/2021 12:00 AM     Problem: Chronic Disease Management Needs   Priority: Medium     Long-Range Goal: Development of plan of care for long term chronic disease management   Start Date: 03/21/2021  Expected End Date: 03/21/2022  Priority: Medium  Note:   Current Barriers:  Chronic Disease Management support and education needs related to HTN, HLD, and Parkinson's disease Fragile state of health, multiple progressing chronic health conditions with mobility issues: wheelchair bound History of frequent falls- last reported fall February 2022  RNCM Clinical Goal(s):  Patient will demonstrate ongoing adherence to prescribed treatment plan for HTN, HLD, and Parkinson's Disease as evidenced by adherence to prescribed medication regimen contacting provider for new or worsened symptoms or questions daily monitoring/ recording of blood pressures at home, use of assistive devices for ambulation, ongoing engagement with private duty caregivers    through  collaboration with Consulting civil engineer, provider, and care team.   Interventions: 1:1 collaboration with primary care provider regarding development and update of comprehensive plan of care as evidenced by provider attestation and co-signature Inter-disciplinary care team collaboration (see longitudinal plan of care) Evaluation of current treatment plan related to  self management and patient's adherence to plan as established by provider  05/29/21- Notified by CCM Pharmacist that patient has moved to ALF and has changed provider to on-site provider at ALF facility; case closure completed accordingly with completion of care plan goals    Plan: No further follow up required: case closure- patient now residing in ALF and has changed PCP provider to on-site PCP  Oneta Rack, RN, BSN, [facility] RN Care Coordination- [place] [phone]: direct office [phone]: mobile

## 2021-05-31 ENCOUNTER — Ambulatory Visit (HOSPITAL_BASED_OUTPATIENT_CLINIC_OR_DEPARTMENT_OTHER): Payer: Medicare Other | Attending: Family Medicine | Admitting: Physical Therapy

## 2021-05-31 ENCOUNTER — Other Ambulatory Visit: Payer: Self-pay

## 2021-05-31 ENCOUNTER — Encounter (HOSPITAL_BASED_OUTPATIENT_CLINIC_OR_DEPARTMENT_OTHER): Payer: Self-pay | Admitting: Physical Therapy

## 2021-05-31 DIAGNOSIS — M6281 Muscle weakness (generalized): Secondary | ICD-10-CM

## 2021-05-31 DIAGNOSIS — R2689 Other abnormalities of gait and mobility: Secondary | ICD-10-CM

## 2021-05-31 DIAGNOSIS — M25562 Pain in left knee: Secondary | ICD-10-CM | POA: Insufficient documentation

## 2021-05-31 DIAGNOSIS — G8929 Other chronic pain: Secondary | ICD-10-CM | POA: Diagnosis not present

## 2021-05-31 DIAGNOSIS — R2681 Unsteadiness on feet: Secondary | ICD-10-CM

## 2021-05-31 DIAGNOSIS — M25561 Pain in right knee: Secondary | ICD-10-CM | POA: Insufficient documentation

## 2021-05-31 NOTE — Therapy (Signed)
OUTPATIENT PHYSICAL THERAPY TREATMENT NOTE   Patient Name: [name] MRN: [id number] DOB:1935-07-10, 86 y.o., female Today's Date: 05/31/2021  PCP: [name], MD REFERRING PROVIDER: Lyndal Pulley, DO   PT End of Session - 05/31/21 1026     Visit Number 2    Number of Visits 16    Date for PT Re-Evaluation 07/16/21    Authorization Type Medicare progress ote on visit 10    PT Start Time 0938    PT Stop Time 1020    PT Time Calculation (min) 42 min    Activity Tolerance Patient tolerated treatment well    Behavior During Therapy [facility] for tasks assessed/performed             Past Medical History:  Diagnosis Date   Allergy    Anemia    Cancer of left breast ([place]) 1978   CHF (congestive heart failure) ([place])    Chronic thoracic back pain    "T8; fracture; 03/2015; no OR" (05/05/2016)   GERD (gastroesophageal reflux disease)    Heart murmur    History of hiatal hernia    Hyperlipidemia    Hyperplastic colon polyp    Hypertension    IBS (irritable bowel syndrome)    Multiple thyroid nodules    Osteoporosis    T8 compression fx 03/2015    Personal history of radiation therapy    Presence of permanent cardiac pacemaker    Vitamin B12 deficiency    Past Surgical History:  Procedure Laterality Date   ANTERIOR CERVICAL DECOMP/DISCECTOMY FUSION  2008   C5-6   AUGMENTATION MAMMAPLASTY     BACK SURGERY     BREAST SURGERY     CARDIAC CATHETERIZATION  2001   EP IMPLANTABLE DEVICE N/A 05/05/2016   Procedure: Pacemaker Implant;  Surgeon: Evans Lance, MD;  Location: [place] [facility];  Service: Cardiovascular;  Laterality: N/A;   EXCISIONAL HEMORRHOIDECTOMY  1984   With subsequent correction of surgery   INSERT / REPLACE / REMOVE PACEMAKER     INTRAVASCULAR PRESSURE WIRE/FFR STUDY N/A 10/07/2019   Procedure: INTRAVASCULAR PRESSURE WIRE/FFR STUDY;  Surgeon: Jettie Booze, MD;  Location: [name] [facility];  Service: Cardiovascular;  Laterality: N/A;    KNEE ARTHROSCOPY Left    "meniscus tear"   LEFT HEART CATH AND CORONARY ANGIOGRAPHY N/A 10/07/2019   Procedure: LEFT HEART CATH AND CORONARY ANGIOGRAPHY;  Surgeon: Jettie Booze, MD;  Location: [facility];  Service: Cardiovascular;  Laterality: N/A;   MASTECTOMY Left 1978   PLACEMENT OF BREAST IMPLANTS Left 1981   REDUCTION MAMMAPLASTY Right    SHOULDER ARTHROSCOPY W/ ROTATOR CUFF REPAIR Left 02/2006   "tear"   TUBAL LIGATION     Patient Active Problem List   Diagnosis Date Noted   IBS (irritable bowel syndrome) 10/28/2020   Tremor of both hands 07/30/2020   Left arm swelling 04/10/2020   Aortic atherosclerosis ([place]) 03/30/2020   Cellulitis of arm, left 03/30/2020   Chronic thoracic back pain 02/03/2020   Iron deficiency anemia 02/02/2020   Near syncope 09/27/2019   Hypertension 09/20/2019   Acute pain of right knee 08/03/2019   (HFpEF) heart failure with preserved ejection fraction ([place]) 08/02/2019   Pacemaker 07/19/2019   CKD (chronic kidney disease) 01/30/2019   Lumbar spinal stenosis 01/10/2018   Degenerative arthritis of knee, bilateral 12/15/2017   Epigastric pain 09/09/2017   Neuralgia 09/09/2017   Claudication of calf muscles ([place]) 11/22/2016   DOE (dyspnea  on exertion) 11/22/2016   Hyperreflexia of lower extremity 07/16/2016   Bilateral leg paresthesia 07/16/2016   Carotid arterial disease ([place]) 07/13/2016   Poor balance 06/23/2016   Weakness of both lower extremities 06/23/2016   Sinus node dysfunction (HCC) 05/05/2016   Right shoulder pain 03/14/2016   Multinodular goiter (nontoxic) 02/21/2016   Neck pain 02/15/2016   Prediabetes 12/17/2015   Dysphagia 12/17/2015   Anxiety 11/13/2015   T8 vertebral fracture ([place]) 04/09/2015   Overweight 10/09/2011   Allergic rhinitis, cause unspecified    GERD (gastroesophageal reflux disease) 02/05/2011   Vitamin B 12 deficiency 04/17/2009   Hyperlipidemia 04/17/2009   Osteoporosis 04/17/2009    ADENOCARCINOMA, BREAST, HX OF 04/17/2009   Constipation 03/08/2009   COLONIC POLYPS, HYPERPLASTIC, HX OF 03/06/2009     REFERRING PROVIDER: Dr Charlann Boxer    REFERRING DIAG: M25.561,M25.562,G89.29 (ICD-10-CM) - Chronic pain of both knees   THERAPY DIAG:  Bilateral knee pain; frequent falls; gait abnormality   ONSET DATE: Several years    SUBJECTIVE:    SUBJECTIVE STATEMENT: Patient's mobility has been declining for some time. In February she fell 2x. Since that point she has been standing and walking less. She has bilateral knee pain. She has significant knee pain R> L as well. Her neurologist has suggested that she not walk. Dr Tamala Julian is concerned that if that happens she will have a consistent decline in her mobility. She would like to try pool therapy.  Pt is not walking due to MD orders and her balance issues.  She is severely limited in standing and primarily stays in her W/C.  Pt denies pain currently and denies any adverse effects after prior Rx.  Pt states her knees hurt more at night.  Pt reports she has a HEP but is not currently doing it.      PERTINENT HISTORY: Parkinson's, CHF, compression fracture of t-8; pacemaker ; B12 deficiency; anterior cervical decompression;  osteoporosis;      PAIN:  Are you having pain?No NPRS scale: 0/10 Pain location: both knees  Pain orientation: Right and Left  PAIN TYPE: aching Pain description: intermittent  Aggravating factors: standing and walking Relieving factors: not walking    PRECAUTIONS: Fall High fall risk    FALLS:  Has patient fallen in last 6 months? Yes, Number of falls: Nothing since February 2nd to not walking at this time but was having frequent fall sat that time      PLOF: Needs assistance with ADLs, Needs assistance with homemaking, and Needs assistance with gait   PATIENT GOALS    To be able to move better      OBJECTIVE:      TODAY'S TREATMENT: TRANSFERS:  Pt requires assistance for safe transfers and  PT used the pool chair lift to enter/exit the pool.  Pt transferred from W/C to pool chair lift min assist +2 with cuing for stepping and reaching for arm of chair.  Pt required min Assist +1 after Rx getting from pool chair lift to W/C with verbal cuing for stepping and reaching for arm of chair.  Pt had a tremor upon standing.    -Pt seen for aquatic therapy today.  Treatment took place in water 3.5 ft depth at the [employer] pool. Temp of water was 95.   -Reviewed response to prior Rx, current function, HEP compliance, and pain level.  -PT allowed pt to acclimate to the pool and standing balance by standing in 3.5 ft with water walker with min  assist and cuing to stand up tall, not lean bwd, and to have her feet under her.   -pt ambulated with water walker with CGA and min assist approx 2 laps with rest breaks during her walking.  Pt is very slow with gait and has decreased bilat step length.   -Pt stood with yellow noodle for 3 sets of approx 1-1.5 mins with SBA/CGA and occasionally min assist.  Pt stood with feet closer together with yellow noodle with min to mod assist 2x20-25 seconds -seated LAQ and seated marching 2x10 reps each -Seated on edge of pool bench with yellow noodle for support with feet on the ground with min assist to improve core strength   Pt requires buoyancy for support and to offload joints with strengthening exercises. Viscosity of the water is needed for resistance of strengthening; water current perturbations provides challenge to standing balance unsupported, requiring increased core activation       PATIENT EDUCATION:  Education details: benefits of aquatic therapy, exercise form, transfer training, safety with pool mobility, and balance. Person educated: Patient Education method: Explanation, Demonstration, Tactile cues, Verbal cues Education comprehension: verbalized understanding, returned demonstration, verbal cues required, tactile cues required, and  needs further education     HOME EXERCISE PROGRAM: Patient has an established exercise program.   ASSESSMENT:   CLINICAL IMPRESSION:  Pt requires assistance for safe transfers and assistance with mobility in the pool for safety.  Pt states she feels good in the water.  Pt tends to lean backward with standing in the pool even when holding onto the walker or other support.  She required assistance for standing and to not lose her balance.  She also had tremors initially with standing.  As pt stood longer with support (walker), she was able to lean her weight forward and stand up tall without physical assistance.  Her tremor also decreased.  Pt required assistance with walking though was able to maintain her balance with water walker requiring CGA and occasional min assist.  Pt has decreased step length bilat and decreased gait speed.  Pt did well with DLS with yellow noodle and some assistance though did report fatigue in bilat knees.  Pt had difficulty with maintaining control and not floating up when sitting on pool bench and also sitting on pool bench without back support.  She required assistance from PT to not lose balance and maintain control with sitting on pool bench without back support.  Pt responded well to Rx having no increased pain after Rx though she was fatigued.     Abnormal gait, cardiopulmonary status limiting activity, decreased activity tolerance, decreased balance, decreased coordination, decreased endurance, decreased knowledge of use of DME, decreased mobility, difficulty walking, decreased strength, impaired perceived functional ability, improper body mechanics, and pain   cleaning, community activity, meal prep, laundry, yard work, and shopping   Age, Behavior pattern, Time since onset of injury/illness/exacerbation, and 3+ comorbidities: Pacemaker, anxiety, Parkinson's; severe OA of the knees       GOALS:    SHORT TERM GOALS:   STG Name Target Date Goal status  1  Patient will stand for 1 minute without increased tremors or fatigue  Baseline:  06/18/2021 INITIAL  2 Patient will ambulate 20' without increased pain in back and knee and increased fatigue  Baseline:  06/18/2021 INITIAL  3 Patient will transfer sit to stand with min guard  Baseline: 06/18/2021 INITIAL  LONG TERM GOALS:    LTG Name Target Date Goal status  1  Patient and husband will be comfortable getting patient in and out of the pool in order to be comfortable continuing program at their ALF  Baseline: 07/16/2021 INITIAL  2 Patient will ambulate 100' without fatigue and pain with RW in order to ambualte in her household   Baseline: 07/02/2021 INITIAL  3 Patient wil transfer independently in order to use her electric wheel  Baseline: 07/16/2021 INITIAL  PLAN: PT FREQUENCY: 2x/week   PT DURATION: 8 weeks   PLANNED INTERVENTIONS: Therapeutic exercises, Therapeutic activity, Neuro Muscular re-education, Balance training, Gait training, Patient/Family education, Joint mobilization, Stair training, Aquatic Therapy, Cryotherapy, Moist heat, Taping, and Manual therapy   PLAN FOR NEXT SESSION: use chair lift to get into the pool. Work on basic transfers.  Cont with aquatic therapy    Selinda Michaels III PT, DPT 05/31/21 11:05 AM

## 2021-06-06 ENCOUNTER — Ambulatory Visit (HOSPITAL_BASED_OUTPATIENT_CLINIC_OR_DEPARTMENT_OTHER): Payer: Medicare Other | Admitting: Physical Therapy

## 2021-06-06 ENCOUNTER — Encounter (HOSPITAL_BASED_OUTPATIENT_CLINIC_OR_DEPARTMENT_OTHER): Payer: Self-pay | Admitting: Physical Therapy

## 2021-06-06 ENCOUNTER — Other Ambulatory Visit: Payer: Self-pay

## 2021-06-06 DIAGNOSIS — M25561 Pain in right knee: Secondary | ICD-10-CM

## 2021-06-06 DIAGNOSIS — M6281 Muscle weakness (generalized): Secondary | ICD-10-CM

## 2021-06-06 DIAGNOSIS — M25562 Pain in left knee: Secondary | ICD-10-CM | POA: Diagnosis not present

## 2021-06-06 DIAGNOSIS — R2689 Other abnormalities of gait and mobility: Secondary | ICD-10-CM

## 2021-06-06 DIAGNOSIS — R2681 Unsteadiness on feet: Secondary | ICD-10-CM | POA: Diagnosis not present

## 2021-06-06 DIAGNOSIS — G8929 Other chronic pain: Secondary | ICD-10-CM | POA: Diagnosis not present

## 2021-06-06 NOTE — Therapy (Signed)
OUTPATIENT PHYSICAL THERAPY TREATMENT NOTE   Patient Name: [name] MRN: [id number] DOB:July 12, 1935, 86 y.o., female Today's Date: 06/06/2021  PCP: [name], MD REFERRING PROVIDER: [name], MD   PT End of Session - 06/06/21 1015     Visit Number 3    Number of Visits 16    Date for PT Re-Evaluation 07/16/21    Authorization Type Medicare progress note on visit 10    PT Start Time 0930    PT Stop Time 1013    PT Time Calculation (min) 43 min    Activity Tolerance Patient tolerated treatment well    Behavior During Therapy WFL for tasks assessed/performed             Past Medical History:  Diagnosis Date   Allergy    Anemia    Cancer of left breast ([place]) 1978   CHF (congestive heart failure) ([place])    Chronic thoracic back pain    "T8; fracture; 03/2015; no OR" (05/05/2016)   GERD (gastroesophageal reflux disease)    Heart murmur    History of hiatal hernia    Hyperlipidemia    Hyperplastic colon polyp    Hypertension    IBS (irritable bowel syndrome)    Multiple thyroid nodules    Osteoporosis    T8 compression fx 03/2015    Personal history of radiation therapy    Presence of permanent cardiac pacemaker    Vitamin B12 deficiency    Past Surgical History:  Procedure Laterality Date   ANTERIOR CERVICAL DECOMP/DISCECTOMY FUSION  2008   C5-6   AUGMENTATION MAMMAPLASTY     BACK SURGERY     BREAST SURGERY     CARDIAC CATHETERIZATION  2001   EP IMPLANTABLE DEVICE N/A 05/05/2016   Procedure: Pacemaker Implant;  Surgeon: Evans Lance, MD;  Location: [place] [facility];  Service: Cardiovascular;  Laterality: N/A;   EXCISIONAL HEMORRHOIDECTOMY  1984   With subsequent correction of surgery   INSERT / REPLACE / REMOVE PACEMAKER     INTRAVASCULAR PRESSURE WIRE/FFR STUDY N/A 10/07/2019   Procedure: INTRAVASCULAR PRESSURE WIRE/FFR STUDY;  Surgeon: Jettie Booze, MD;  Location: [place] [facility];  Service: Cardiovascular;  Laterality: N/A;    KNEE ARTHROSCOPY Left    "meniscus tear"   LEFT HEART CATH AND CORONARY ANGIOGRAPHY N/A 10/07/2019   Procedure: LEFT HEART CATH AND CORONARY ANGIOGRAPHY;  Surgeon: Jettie Booze, MD;  Location: [place];  Service: Cardiovascular;  Laterality: N/A;   MASTECTOMY Left 1978   PLACEMENT OF BREAST IMPLANTS Left 1981   REDUCTION MAMMAPLASTY Right    SHOULDER ARTHROSCOPY W/ ROTATOR CUFF REPAIR Left 02/2006   "tear"   TUBAL LIGATION     Patient Active Problem List   Diagnosis Date Noted   IBS (irritable bowel syndrome) 10/28/2020   Tremor of both hands 07/30/2020   Left arm swelling 04/10/2020   Aortic atherosclerosis ([place]) 03/30/2020   Cellulitis of arm, left 03/30/2020   Chronic thoracic back pain 02/03/2020   Iron deficiency anemia 02/02/2020   Near syncope 09/27/2019   Hypertension 09/20/2019   Acute pain of right knee 08/03/2019   (HFpEF) heart failure with preserved ejection fraction ([place]) 08/02/2019   Pacemaker 07/19/2019   CKD (chronic kidney disease) 01/30/2019   Lumbar spinal stenosis 01/10/2018   Degenerative arthritis of knee, bilateral 12/15/2017   Epigastric pain 09/09/2017   Neuralgia 09/09/2017   Claudication of calf muscles ([place]) 11/22/2016   DOE (dyspnea  on exertion) 11/22/2016   Hyperreflexia of lower extremity 07/16/2016   Bilateral leg paresthesia 07/16/2016   Carotid arterial disease ([place]) 07/13/2016   Poor balance 06/23/2016   Weakness of both lower extremities 06/23/2016   Sinus node dysfunction (HCC) 05/05/2016   Right shoulder pain 03/14/2016   Multinodular goiter (nontoxic) 02/21/2016   Neck pain 02/15/2016   Prediabetes 12/17/2015   Dysphagia 12/17/2015   Anxiety 11/13/2015   T8 vertebral fracture ([place]) 04/09/2015   Overweight 10/09/2011   Allergic rhinitis, cause unspecified    GERD (gastroesophageal reflux disease) 02/05/2011   Vitamin B 12 deficiency 04/17/2009   Hyperlipidemia 04/17/2009   Osteoporosis 04/17/2009    ADENOCARCINOMA, BREAST, HX OF 04/17/2009   Constipation 03/08/2009   COLONIC POLYPS, HYPERPLASTIC, HX OF 03/06/2009     REFERRING PROVIDER: Dr Charlann Boxer    REFERRING DIAG: M25.561,M25.562,G89.29 (ICD-10-CM) - Chronic pain of both knees   THERAPY DIAG:  Bilateral knee pain; frequent falls; gait abnormality   ONSET DATE: Several years    SUBJECTIVE:    SUBJECTIVE STATEMENT: Patient's mobility has been declining for some time.  She has bilateral knee pain with R > L.  Her neurologist has suggested that she not walk.  Dr Tamala Julian is concerned that if that happens she will have a consistent decline in her mobility.  Pt is not walking due to MD orders and her balance issues.  She is severely limited in standing and primarily stays in her W/C.  Pt states her knees hurt more at night.   Pt states she was contacted by [facility] about the motorized cart and has an appt in December to get fitted for motorized cart.  Pt states she doesn't do as good in the mornings.   Pt denies any adverse effects after prior Rx.  Pt states her R knee has been bothering her, but she has no pain currently.    PERTINENT HISTORY: Parkinson's, CHF, compression fracture of t-8; pacemaker ; B12 deficiency; anterior cervical decompression;  osteoporosis;      PAIN:  Are you having pain?No NPRS scale: 0/10 Pain location: both knees  Pain orientation: Right and Left  PAIN TYPE: aching Pain description: intermittent  Aggravating factors: standing and walking Relieving factors: not walking    PRECAUTIONS: Fall High fall risk    FALLS:  Has patient fallen in last 6 months? Yes, Number of falls: Nothing since February 2nd to not walking at this time but was having frequent fall sat that time      PLOF: Needs assistance with ADLs, Needs assistance with homemaking, and Needs assistance with gait   PATIENT GOALS    To be able to move better      OBJECTIVE:      TODAY'S TREATMENT: TRANSFERS:  Pt requires  assistance for safe transfers and PT used the pool chair lift to enter/exit the pool.  Pt transferred from W/C to pool chair lift mod assist +1 with cuing for stepping and reaching for arm of chair.  Pt required min Assist +1 after Rx getting from pool chair lift to W/C with verbal cuing for stepping and reaching for arm of chair.  Pt had a tremor upon standing.    -Pt seen for aquatic therapy today.  Treatment took place in water 3.5 ft depth at the [facility] pool. Temp of water was 93.   -Reviewed response to prior Rx, current function, and pain level.  -PT allowed pt to acclimate to the pool and standing balance by  standing in 3.5 ft with water walker with min assist and cuing to stand up tall, not lean bwd, and to have her feet under her.   -pt ambulated with water walker with SBA/CGA and min assist for 3 laps.  Pt is very slow with gait and has decreased bilat step length.   -Pt stood with yellow noodle x 2 mins with SBA.  Pt stood with feet closer together with yellow noodle with SBA/CGA and 1-2 occasions of min assist 2x30 seconds -Pt stood with FA without noodle 2x30 sec with SBA/CGA and occasionally min assist -Standing marching with bilat UEs on pool wall 2x10 reps. -seated LAQ and seated marching 2x10 reps each -seated bicycles -Sit to stand with UE assistance on pool walker approx 10 reps requiring mod assist and cuing -Seated on edge of pool bench with yellow noodle for support with feet on the ground with min assist to improve core strength   Pt requires buoyancy for support and to offload joints with strengthening exercises. Viscosity of the water is needed for resistance of strengthening; water current perturbations provides challenge to standing balance unsupported, requiring increased core activation       PATIENT EDUCATION:  Education details: benefits of aquatic therapy, exercise form, transfer training, safety with pool mobility, and balance. Person educated:  Patient Education method: Explanation, Demonstration, Tactile cues, Verbal cues Education comprehension: verbalized understanding, returned demonstration, verbal cues required, tactile cues required, and needs further education     HOME EXERCISE PROGRAM: Patient has an established exercise program.   ASSESSMENT:   CLINICAL IMPRESSION:  Pt requires assistance for safe transfers and assistance with mobility in the pool for safety.  Pt tends to lean backward with standing in the pool initially even when holding onto the walker or other support.  Pt required increased assistance initially with ambulation with water walker including to gain balance and stand upright and not lean backward.  When she was able to gain her balance, she was able to ambulate increased distance without physical assistance.    Pt demonstrates improved stability and balance in the pool today requiring decreased assistance with walking and exercise though still requires assistance in the pool.  Pt unable to perform sit to stand in the pool well due to strength and balance deficits.  Pt requires assistance with performing stand to sit transfer on the pool bench and to get in the correct position on the pool bench.  Pt responded well to Rx having no increased pain after Rx and had less fatigue than prior Rx.     Abnormal gait, cardiopulmonary status limiting activity, decreased activity tolerance, decreased balance, decreased coordination, decreased endurance, decreased knowledge of use of DME, decreased mobility, difficulty walking, decreased strength, impaired perceived functional ability, improper body mechanics, and pain   cleaning, community activity, meal prep, laundry, yard work, and shopping   Age, Behavior pattern, Time since onset of injury/illness/exacerbation, and 3+ comorbidities: Pacemaker, anxiety, Parkinson's; severe OA of the knees       GOALS:    SHORT TERM GOALS:   STG Name Target Date Goal status  1  Patient will stand for 1 minute without increased tremors or fatigue  Baseline:  06/18/2021 INITIAL  2 Patient will ambulate 14' without increased pain in back and knee and increased fatigue  Baseline:  06/18/2021 INITIAL  3 Patient will transfer sit to stand with min guard  Baseline: 06/18/2021 INITIAL  LONG TERM GOALS:    LTG Name Target Date Goal status  1 Patient and husband will be comfortable getting patient in and out of the pool in order to be comfortable continuing program at their ALF  Baseline: 07/16/2021 INITIAL  2 Patient will ambulate 100' without fatigue and pain with RW in order to ambualte in her household   Baseline: 07/02/2021 INITIAL  3 Patient wil transfer independently in order to use her electric wheel  Baseline: 07/16/2021 INITIAL  PLAN: PT FREQUENCY: 2x/week   PT DURATION: 8 weeks   PLANNED INTERVENTIONS: Therapeutic exercises, Therapeutic activity, Neuro Muscular re-education, Balance training, Gait training, Patient/Family education, Joint mobilization, Stair training, Aquatic Therapy, Cryotherapy, Moist heat, Taping, and Manual therapy   PLAN FOR NEXT SESSION: use chair lift to get into the pool. Work on basic transfers.  Cont with aquatic therapy    Selinda Michaels III PT, DPT 06/06/21 10:36 PM

## 2021-06-10 ENCOUNTER — Telehealth: Payer: Medicare Other

## 2021-06-11 ENCOUNTER — Other Ambulatory Visit: Payer: Self-pay

## 2021-06-11 ENCOUNTER — Ambulatory Visit (HOSPITAL_BASED_OUTPATIENT_CLINIC_OR_DEPARTMENT_OTHER): Payer: Medicare Other | Admitting: Physical Therapy

## 2021-06-11 ENCOUNTER — Encounter (HOSPITAL_BASED_OUTPATIENT_CLINIC_OR_DEPARTMENT_OTHER): Payer: Self-pay | Admitting: Physical Therapy

## 2021-06-11 ENCOUNTER — Encounter: Payer: Medicare Other | Admitting: Psychology

## 2021-06-11 DIAGNOSIS — R2681 Unsteadiness on feet: Secondary | ICD-10-CM | POA: Diagnosis not present

## 2021-06-11 DIAGNOSIS — M25561 Pain in right knee: Secondary | ICD-10-CM

## 2021-06-11 DIAGNOSIS — R2689 Other abnormalities of gait and mobility: Secondary | ICD-10-CM | POA: Diagnosis not present

## 2021-06-11 DIAGNOSIS — G8929 Other chronic pain: Secondary | ICD-10-CM

## 2021-06-11 DIAGNOSIS — M6281 Muscle weakness (generalized): Secondary | ICD-10-CM | POA: Diagnosis not present

## 2021-06-11 DIAGNOSIS — M25562 Pain in left knee: Secondary | ICD-10-CM

## 2021-06-11 NOTE — Therapy (Signed)
OUTPATIENT PHYSICAL THERAPY TREATMENT NOTE   Patient Name: [name] MRN: [id number] DOB:20-Jan-1935, 86 y.o., female Today's Date: 06/11/2021  PCP: [name], MD REFERRING PROVIDER: [name], MD   PT End of Session - 06/11/21 1834     Visit Number 4    Number of Visits 16    Date for PT Re-Evaluation 07/16/21    Authorization Type Medicare progress note on visit 10    PT Start Time 0930    PT Stop Time 1014    PT Time Calculation (min) 44 min    Activity Tolerance Patient tolerated treatment well    Behavior During Therapy [facility] for tasks assessed/performed             Past Medical History:  Diagnosis Date   Allergy    Anemia    Cancer of left breast ([place]) 1978   CHF (congestive heart failure) ([place])    Chronic thoracic back pain    "T8; fracture; 03/2015; no OR" (05/05/2016)   GERD (gastroesophageal reflux disease)    Heart murmur    History of hiatal hernia    Hyperlipidemia    Hyperplastic colon polyp    Hypertension    IBS (irritable bowel syndrome)    Multiple thyroid nodules    Osteoporosis    T8 compression fx 03/2015    Personal history of radiation therapy    Presence of permanent cardiac pacemaker    Vitamin B12 deficiency    Past Surgical History:  Procedure Laterality Date   ANTERIOR CERVICAL DECOMP/DISCECTOMY FUSION  2008   C5-6   AUGMENTATION MAMMAPLASTY     BACK SURGERY     BREAST SURGERY     CARDIAC CATHETERIZATION  2001   EP IMPLANTABLE DEVICE N/A 05/05/2016   Procedure: Pacemaker Implant;  Surgeon: Evans Lance, MD;  Location: [place] [facility];  Service: Cardiovascular;  Laterality: N/A;   EXCISIONAL HEMORRHOIDECTOMY  1984   With subsequent correction of surgery   INSERT / REPLACE / REMOVE PACEMAKER     INTRAVASCULAR PRESSURE WIRE/FFR STUDY N/A 10/07/2019   Procedure: INTRAVASCULAR PRESSURE WIRE/FFR STUDY;  Surgeon: Jettie Booze, MD;  Location: [place] [facility];  Service: Cardiovascular;  Laterality: N/A;    KNEE ARTHROSCOPY Left    "meniscus tear"   LEFT HEART CATH AND CORONARY ANGIOGRAPHY N/A 10/07/2019   Procedure: LEFT HEART CATH AND CORONARY ANGIOGRAPHY;  Surgeon: Jettie Booze, MD;  Location: [place] [facility];  Service: Cardiovascular;  Laterality: N/A;   MASTECTOMY Left 1978   PLACEMENT OF BREAST IMPLANTS Left 1981   REDUCTION MAMMAPLASTY Right    SHOULDER ARTHROSCOPY W/ ROTATOR CUFF REPAIR Left 02/2006   "tear"   TUBAL LIGATION     Patient Active Problem List   Diagnosis Date Noted   IBS (irritable bowel syndrome) 10/28/2020   Tremor of both hands 07/30/2020   Left arm swelling 04/10/2020   Aortic atherosclerosis ([place]) 03/30/2020   Cellulitis of arm, left 03/30/2020   Chronic thoracic back pain 02/03/2020   Iron deficiency anemia 02/02/2020   Near syncope 09/27/2019   Hypertension 09/20/2019   Acute pain of right knee 08/03/2019   (HFpEF) heart failure with preserved ejection fraction ([place]) 08/02/2019   Pacemaker 07/19/2019   CKD (chronic kidney disease) 01/30/2019   Lumbar spinal stenosis 01/10/2018   Degenerative arthritis of knee, bilateral 12/15/2017   Epigastric pain 09/09/2017   Neuralgia 09/09/2017   Claudication of calf muscles ([place]) 11/22/2016   DOE (dyspnea  on exertion) 11/22/2016   Hyperreflexia of lower extremity 07/16/2016   Bilateral leg paresthesia 07/16/2016   Carotid arterial disease ([place]) 07/13/2016   Poor balance 06/23/2016   Weakness of both lower extremities 06/23/2016   Sinus node dysfunction (HCC) 05/05/2016   Right shoulder pain 03/14/2016   Multinodular goiter (nontoxic) 02/21/2016   Neck pain 02/15/2016   Prediabetes 12/17/2015   Dysphagia 12/17/2015   Anxiety 11/13/2015   T8 vertebral fracture ([place]) 04/09/2015   Overweight 10/09/2011   Allergic rhinitis, cause unspecified    GERD (gastroesophageal reflux disease) 02/05/2011   Vitamin B 12 deficiency 04/17/2009   Hyperlipidemia 04/17/2009   Osteoporosis 04/17/2009    ADENOCARCINOMA, BREAST, HX OF 04/17/2009   Constipation 03/08/2009   COLONIC POLYPS, HYPERPLASTIC, HX OF 03/06/2009   REFERRING PROVIDER: Dr Charlann Boxer    REFERRING DIAG: M25.561,M25.562,G89.29 (ICD-10-CM) - Chronic pain of both knees   THERAPY DIAG:  Bilateral knee pain; frequent falls; gait abnormality    ONSET DATE: Several years    SUBJECTIVE:    SUBJECTIVE STATEMENT: Patient reports that her parkinson's has been bad the past few days She feels like she has been shaking more. She also feels like she has been able to transfer more and she has been walking more in the house. After the last visit she had knee pain that lasted a few days. Her knee is not hurting her today   PERTINENT HISTORY: Parkinson's, CHF, compression fracture of t-8; pacemaker ; B12 deficiency; anterior cervical decompression;  osteoporosis;      PAIN:  Are you having pain?No NPRS scale: 0/10 Pain location: both knees  Pain orientation: Right and Left  PAIN TYPE: aching Pain description: intermittent  Aggravating factors: standing and walking Relieving factors: not walking    PRECAUTIONS: Fall High fall risk    FALLS:  Has patient fallen in last 6 months? Yes, Number of falls: Nothing since February 2nd to not walking at this time but was having frequent fall sat that time      PLOF: Needs assistance with ADLs, Needs assistance with homemaking, and Needs assistance with gait   PATIENT GOALS    To be able to move better      OBJECTIVE:      TODAY'S TREATMENT: TRANSFERS:  Pt requires assistance for safe transfers and PT used the pool chair lift to enter/exit the pool.  Pt transferred from W/C to pool chair lift mod assist +1 with cuing for stepping and reaching for arm of chair.  Pt required min Assist +1 after Rx getting from pool chair lift to W/C with verbal cuing for stepping and reaching for arm of chair.  Pt had a tremor upon standing.     -Pt seen for aquatic therapy today.  Treatment took  place in water 3.5 ft depth at the [facility] pool. Temp of water was 93.   -Reviewed response to prior Rx, current function, and pain level.  -PT allowed pt to acclimate to the pool and standing balance by standing in 3.5 ft with water walker with min assist and cuing to stand up tall, not lean bwd, and to have her feet under her.   -pt ambulated with water walker with SBA/CGA and min assist for 3 laps.  Pt is very slow with gait and has decreased bilat step length.   -Pt stood with yellow noodle x 2 mins with SBA.  Pt stood with feet closer together with yellow noodle with SBA/CGA and 1-2 occasions of min assist  2x30 seconds -Pt stood with FA without noodle 2x30 sec with SBA/CGA and occasionally min assist -Standing marching with bilat UEs on pool wall 2x10 reps. -seated LAQ and seated marching 2x10 reps each -seated bicycles -Sit to stand with UE assistance on pool walker approx 10 reps requiring mod assist and cuing -Seated on edge of pool bench with yellow noodle for support with feet on the ground with min assist to improve core strength     Pt requires buoyancy for support and to offload joints with strengthening exercises. Viscosity of the water is needed for resistance of strengthening; water current perturbations provides challenge to standing balance unsupported, requiring increased core activation  11/15  TRANSFERS:  Pt requires assistance for safe transfers and PT used the pool chair lift to enter/exit the pool.  Pt transferred from W/C to pool chair lift mod assist +1 with cuing for stepping and reaching for arm of chair.  Pt required min Assist +1 after Rx getting from pool chair lift to W/C with verbal cuing for stepping and reaching for arm of chair.  Pt had a tremor upon standing although improved today.     -Pt seen for aquatic therapy today.  Treatment took place in water 3.5 ft depth at the [facility] pool. Temp of water was 93.   -Reviewed response to  prior Rx, current function, and pain level. Patient reported knee pain after the last visit    -pt ambulated with water walker with SBA/CGA and min assist for 3 laps.  Pt is very slow with gait and has decreased bilat step length.   - Patient ambulated with march with CGA 2 laps  - Standing weigth shift at the wall 2x15  -Standing hip abdcution at the wall 2x10  -Standing marching with bilat UEs on pool wall 2x10 reps. -seated LAQ and seated marching 2x10 reps each -seated bicycles - seated hip abduction 2x10  -Sit to stand with UE assistance on pool walker approx 10 reps requiring mod assist and cuing -Seated on edge of pool bench with blue noodle push and pusll for posture 2x10      Pt requires buoyancy for support and to offload joints with strengthening exercises. Viscosity of the water is needed for resistance of strengthening; water current perturbations provides challenge to standing balance unsupported, requiring increased core activation         PATIENT EDUCATION:  Education details: benefits of aquatic therapy, exercise form, transfer training, safety with pool mobility, and balance. Person educated: Patient Education method: Explanation, Demonstration, Tactile cues, Verbal cues Education comprehension: verbalized understanding, returned demonstration, verbal cues required, tactile cues required, and needs further education     HOME EXERCISE PROGRAM: Patient has an established exercise program.   ASSESSMENT:   CLINICAL IMPRESSION: Patient is making some progress. She reports she has been walking more at home. She had less trouble transferring today. She was advised to make sure to be careful at home as she is still a high fall risk. Therapy monitored her knee today. She reported no pain with treatment. She did report fatigue towards the end of the treatment.    Abnormal gait, cardiopulmonary status limiting activity, decreased activity tolerance, decreased balance,  decreased coordination, decreased endurance, decreased knowledge of use of DME, decreased mobility, difficulty walking, decreased strength, impaired perceived functional ability, improper body mechanics, and pain   cleaning, community activity, meal prep, laundry, yard work, and shopping   Age, Behavior pattern, Time since onset of injury/illness/exacerbation, and 3+ comorbidities:  Pacemaker, anxiety, Parkinson's; severe OA of the knees       GOALS:     SHORT TERM GOALS:   STG Name Target Date Goal status  1 Patient will stand for 1 minute without increased tremors or fatigue  Baseline:  06/18/2021 INITIAL  2 Patient will ambulate 53' without increased pain in back and knee and increased fatigue  Baseline:  06/18/2021 INITIAL  3 Patient will transfer sit to stand with min guard  Baseline: 06/18/2021 INITIAL  LONG TERM GOALS:    LTG Name Target Date Goal status  1 Patient and husband will be comfortable getting patient in and out of the pool in order to be comfortable continuing program at their ALF  Baseline: 07/16/2021 INITIAL  2 Patient will ambulate 100' without fatigue and pain with RW in order to ambualte in her household   Baseline: 07/02/2021 INITIAL  3 Patient wil transfer independently in order to use her electric wheel  Baseline: 07/16/2021 INITIAL  PLAN: PT FREQUENCY: 2x/week   PT DURATION: 8 weeks   PLANNED INTERVENTIONS: Therapeutic exercises, Therapeutic activity, Neuro Muscular re-education, Balance training, Gait training, Patient/Family education, Joint mobilization, Stair training, Aquatic Therapy, Cryotherapy, Moist heat, Taping, and Manual therapy   PLAN FOR NEXT SESSION: use chair lift to get into the pool. Work on basic transfers.  Cont with aquatic therapy    Carney Living PT DPT  06/11/2021, 6:35 PM

## 2021-06-12 DIAGNOSIS — Z20828 Contact with and (suspected) exposure to other viral communicable diseases: Secondary | ICD-10-CM | POA: Diagnosis not present

## 2021-06-17 ENCOUNTER — Encounter: Payer: Medicare Other | Admitting: Psychology

## 2021-06-18 ENCOUNTER — Ambulatory Visit (HOSPITAL_BASED_OUTPATIENT_CLINIC_OR_DEPARTMENT_OTHER): Payer: Medicare Other | Admitting: Physical Therapy

## 2021-06-18 ENCOUNTER — Other Ambulatory Visit: Payer: Self-pay

## 2021-06-18 DIAGNOSIS — G8929 Other chronic pain: Secondary | ICD-10-CM | POA: Diagnosis not present

## 2021-06-18 DIAGNOSIS — M6281 Muscle weakness (generalized): Secondary | ICD-10-CM

## 2021-06-18 DIAGNOSIS — R2689 Other abnormalities of gait and mobility: Secondary | ICD-10-CM | POA: Diagnosis not present

## 2021-06-18 DIAGNOSIS — M25562 Pain in left knee: Secondary | ICD-10-CM | POA: Diagnosis not present

## 2021-06-18 DIAGNOSIS — R2681 Unsteadiness on feet: Secondary | ICD-10-CM | POA: Diagnosis not present

## 2021-06-18 DIAGNOSIS — M25561 Pain in right knee: Secondary | ICD-10-CM | POA: Diagnosis not present

## 2021-06-19 NOTE — Therapy (Addendum)
OUTPATIENT PHYSICAL THERAPY TREATMENT NOTE/Discharge    Patient Name: [name] MRN: [id number] DOB:1935/02/12, 86 y.o., female Today's Date: 06/19/2021  PCP: [name], MD REFERRING PROVIDER: [name], MD   PT End of Session - 06/19/21 [phone]     Visit Number 5    Number of Visits 16    Date for PT Re-Evaluation 07/16/21    Authorization Type Medicare progress note on visit 10    PT Start Time 1015    PT Stop Time 1058    PT Time Calculation (min) 43 min    Activity Tolerance Patient tolerated treatment well    Behavior During Therapy WFL for tasks assessed/performed             Past Medical History:  Diagnosis Date   Allergy    Anemia    Cancer of left breast ([place]) 1978   CHF (congestive heart failure) ([place])    Chronic thoracic back pain    "T8; fracture; 03/2015; no OR" (05/05/2016)   GERD (gastroesophageal reflux disease)    Heart murmur    History of hiatal hernia    Hyperlipidemia    Hyperplastic colon polyp    Hypertension    IBS (irritable bowel syndrome)    Multiple thyroid nodules    Osteoporosis    T8 compression fx 03/2015    Personal history of radiation therapy    Presence of permanent cardiac pacemaker    Vitamin B12 deficiency    Past Surgical History:  Procedure Laterality Date   ANTERIOR CERVICAL DECOMP/DISCECTOMY FUSION  2008   C5-6   AUGMENTATION MAMMAPLASTY     BACK SURGERY     BREAST SURGERY     CARDIAC CATHETERIZATION  2001   EP IMPLANTABLE DEVICE N/A 05/05/2016   Procedure: Pacemaker Implant;  Surgeon: Evans Lance, MD;  Location: [facility];  Service: Cardiovascular;  Laterality: N/A;   EXCISIONAL HEMORRHOIDECTOMY  1984   With subsequent correction of surgery   INSERT / REPLACE / REMOVE PACEMAKER     INTRAVASCULAR PRESSURE WIRE/FFR STUDY N/A 10/07/2019   Procedure: INTRAVASCULAR PRESSURE WIRE/FFR STUDY;  Surgeon: Jettie Booze, MD;  Location: [facility];  Service: Cardiovascular;   Laterality: N/A;   KNEE ARTHROSCOPY Left    "meniscus tear"   LEFT HEART CATH AND CORONARY ANGIOGRAPHY N/A 10/07/2019   Procedure: LEFT HEART CATH AND CORONARY ANGIOGRAPHY;  Surgeon: Jettie Booze, MD;  Location: [facility];  Service: Cardiovascular;  Laterality: N/A;   MASTECTOMY Left 1978   PLACEMENT OF BREAST IMPLANTS Left 1981   REDUCTION MAMMAPLASTY Right    SHOULDER ARTHROSCOPY W/ ROTATOR CUFF REPAIR Left 02/2006   "tear"   TUBAL LIGATION     Patient Active Problem List   Diagnosis Date Noted   IBS (irritable bowel syndrome) 10/28/2020   Tremor of both hands 07/30/2020   Left arm swelling 04/10/2020   Aortic atherosclerosis ([place]) 03/30/2020   Cellulitis of arm, left 03/30/2020   Chronic thoracic back pain 02/03/2020   Iron deficiency anemia 02/02/2020   Near syncope 09/27/2019   Hypertension 09/20/2019   Acute pain of right knee 08/03/2019   (HFpEF) heart failure with preserved ejection fraction ([place]) 08/02/2019   Pacemaker 07/19/2019   CKD (chronic kidney disease) 01/30/2019   Lumbar spinal stenosis 01/10/2018   Degenerative arthritis of knee, bilateral 12/15/2017   Epigastric pain 09/09/2017   Neuralgia 09/09/2017   Claudication of calf muscles ([place]) 11/22/2016   DOE (  dyspnea on exertion) 11/22/2016   Hyperreflexia of lower extremity 07/16/2016   Bilateral leg paresthesia 07/16/2016   Carotid arterial disease ([place]) 07/13/2016   Poor balance 06/23/2016   Weakness of both lower extremities 06/23/2016   Sinus node dysfunction ([facility]) 05/05/2016   Right shoulder pain 03/14/2016   Multinodular goiter (nontoxic) 02/21/2016   Neck pain 02/15/2016   Prediabetes 12/17/2015   Dysphagia 12/17/2015   Anxiety 11/13/2015   T8 vertebral fracture ([place]) 04/09/2015   Overweight 10/09/2011   Allergic rhinitis, cause unspecified    GERD (gastroesophageal reflux disease) 02/05/2011   Vitamin B 12 deficiency 04/17/2009   Hyperlipidemia 04/17/2009   Osteoporosis  04/17/2009   ADENOCARCINOMA, BREAST, HX OF 04/17/2009   Constipation 03/08/2009   COLONIC POLYPS, HYPERPLASTIC, HX OF 03/06/2009   REFERRING PROVIDER: Dr Charlann Boxer    REFERRING DIAG: M25.561,M25.562,G89.29 (ICD-10-CM) - Chronic pain of both knees   THERAPY DIAG:  Bilateral knee pain; frequent falls; gait abnormality    ONSET DATE: Several years    SUBJECTIVE:    SUBJECTIVE STATEMENT: Patient continues to do some walking in the house. She is having less shaking with her transfers.  PERTINENT HISTORY: Parkinson's, CHF, compression fracture of t-8; pacemaker ; B12 deficiency; anterior cervical decompression;  osteoporosis;      PAIN:  Are you having pain?No NPRS scale: 0/10 Pain location: both knees  Pain orientation: Right and Left  PAIN TYPE: aching Pain description: intermittent  Aggravating factors: standing and walking Relieving factors: not walking    PRECAUTIONS: Fall High fall risk    FALLS:  Has patient fallen in last 6 months? Yes, Number of falls: Nothing since February 2nd to not walking at this time but was having frequent fall sat that time      PLOF: Needs assistance with ADLs, Needs assistance with homemaking, and Needs assistance with gait   PATIENT GOALS    To be able to move better      OBJECTIVE:      TODAY'S TREATMENT: TRANSFERS:  Pt requires assistance for safe transfers and PT used the pool chair lift to enter/exit the pool.  Pt transferred from W/C to pool chair lift mod assist +1 with cuing for stepping and reaching for arm of chair.  Pt required min Assist +1 after Rx getting from pool chair lift to W/C with verbal cuing for stepping and reaching for arm of chair.  Pt had a tremor upon standing.     -Pt seen for aquatic therapy today.  Treatment took place in water 3.5 ft depth at the [facility] pool. Temp of water was 93.   -Reviewed response to prior Rx, current function, and pain level.  -PT allowed pt to acclimate to the pool  and standing balance by standing in 3.5 ft with water walker with min assist and cuing to stand up tall, not lean bwd, and to have her feet under her.   -pt ambulated with water walker with SBA/CGA and min assist for 3 laps.  Pt is very slow with gait and has decreased bilat step length.   -Pt stood with yellow noodle x 2 mins with SBA.  Pt stood with feet closer together with yellow noodle with SBA/CGA and 1-2 occasions of min assist 2x30 seconds -Pt stood with FA without noodle 2x30 sec with SBA/CGA and occasionally min assist -Standing marching with bilat UEs on pool wall 2x10 reps. -seated LAQ and seated marching 2x10 reps each -seated bicycles -Sit to stand with UE assistance on  pool walker approx 10 reps requiring mod assist and cuing -Seated on edge of pool bench with yellow noodle for support with feet on the ground with min assist to improve core strength     Pt requires buoyancy for support and to offload joints with strengthening exercises. Viscosity of the water is needed for resistance of strengthening; water current perturbations provides challenge to standing balance unsupported, requiring increased core activation   11/15   TRANSFERS:  Pt requires assistance for safe transfers and PT used the pool chair lift to enter/exit the pool.  Pt transferred from W/C to pool chair lift mod assist +1 with cuing for stepping and reaching for arm of chair.  Pt required min Assist +1 after Rx getting from pool chair lift to W/C with verbal cuing for stepping and reaching for arm of chair.  Pt had a tremor upon standing although improved today.     -Pt seen for aquatic therapy today.  Treatment took place in water 3.5 ft depth at the [facility] pool. Temp of water was 93.   -Reviewed response to prior Rx, current function, and pain level. Patient reported knee pain after the last visit      -pt ambulated with water walker with SBA/CGA and min assist for 3 laps.  Pt is very slow with  gait and has decreased bilat step length.   - Patient ambulated with march with CGA 2 laps  - Standing weigth shift at the wall 2x15  -Standing hip abdcution at the wall 2x10  -Standing marching with bilat UEs on pool wall 2x10 reps. -seated LAQ and seated marching 2x10 reps each -seated bicycles - seated hip abduction 2x10  -Sit to stand with UE assistance on pool walker approx 10 reps requiring mod assist and cuing -Seated on edge of pool bench with blue noodle push and pusll for posture 2x10      Pt requires buoyancy for support and to offload joints with strengthening exercises. Viscosity of the water is needed for resistance of strengthening; water current perturbations provides challenge to standing balance unsupported, requiring increased core activation  11/23  TRANSFERS:  Pt requires assistance for safe transfers and PT used the pool chair lift to enter/exit the pool.  Pt transferred from W/C to pool chair lift mod assist +1 with cuing for stepping and reaching for arm of chair.  Pt required min Assist +1 after Rx getting from pool chair lift to W/C with verbal cuing for stepping and reaching for arm of chair.  Pt had a tremor upon standing although improved today.     -Pt seen for aquatic therapy today.  Treatment took place in water 3.5 ft depth at the [facility] pool. Temp of water was 93.   -Reviewed response to prior Rx, current function, and pain level. Patient reported knee pain after the last visit      -pt ambulated with water walker with SBA/CGA and min assist for 3 laps.  Pt is very slow with gait and has decreased bilat step length.   - Patient ambulated with march with CGA 2 laps  - Standing weigth shift at the wall 2x15  -Standing hip abduction at the wall 2x10  -Standing marching with bilat UEs on pool wall 2x10 reps. -seated LAQ and seated marching 2x10 reps each -seated bicycles - seated hip abduction 2x10  -Sit to stand with UE assistance on pool  walker approx 10 reps requiring mod assist and cuing -Seated on edge of pool  bench with blue noodle push and pusll for posture 2x10      Pt requires buoyancy for support and to offload joints with strengthening exercises. Viscosity of the water is needed for resistance of strengthening; water current perturbations provides challenge to standing balance unsupported, requiring increased core activation         PATIENT EDUCATION:  Education details: benefits of aquatic therapy, exercise form, transfer training, safety with pool mobility, and balance. Person educated: Patient Education method: Explanation, Demonstration, Tactile cues, Verbal cues Education comprehension: verbalized understanding, returned demonstration, verbal cues required, tactile cues required, and needs further education     HOME EXERCISE PROGRAM: Patient has an established exercise program.   ASSESSMENT:   CLINICAL IMPRESSION: Exercises kept consistent today. Therapy  continues to work on general strengthening. We will likely progress to balance exercises next visit. She has been able to walk more in the house. Her transfer with therapy is also becoming much smoother with less assist. We will continue to advance as tolerated.   Abnormal gait, cardiopulmonary status limiting activity, decreased activity tolerance, decreased balance, decreased coordination, decreased endurance, decreased knowledge of use of DME, decreased mobility, difficulty walking, decreased strength, impaired perceived functional ability, improper body mechanics, and pain   cleaning, community activity, meal prep, laundry, yard work, and shopping   Age, Behavior pattern, Time since onset of injury/illness/exacerbation, and 3+ comorbidities: Pacemaker, anxiety, Parkinson's; severe OA of the knees       GOALS:     SHORT TERM GOALS:   STG Name Target Date Goal status  1 Patient will stand for 1 minute without increased tremors or fatigue   Baseline:  06/18/2021 INITIAL  2 Patient will ambulate 13' without increased pain in back and knee and increased fatigue  Baseline:  06/18/2021 INITIAL  3 Patient will transfer sit to stand with min guard  Baseline: 06/18/2021 INITIAL  LONG TERM GOALS:    LTG Name Target Date Goal status  1 Patient and husband will be comfortable getting patient in and out of the pool in order to be comfortable continuing program at their ALF  Baseline: 07/16/2021 INITIAL  2 Patient will ambulate 100' without fatigue and pain with RW in order to ambualte in her household   Baseline: 07/02/2021 INITIAL  3 Patient wil transfer independently in order to use her electric wheel  Baseline: 07/16/2021 INITIAL  PLAN: PT FREQUENCY: 2x/week   PT DURATION: 8 weeks   PLANNED INTERVENTIONS: Therapeutic exercises, Therapeutic activity, Neuro Muscular re-education, Balance training, Gait training, Patient/Family education, Joint mobilization, Stair training, Aquatic Therapy, Cryotherapy, Moist heat, Taping, and Manual therapy   PLAN FOR NEXT SESSION: use chair lift to get into the pool. Work on basic transfers.  Cont with aquatic therapy  PHYSICAL THERAPY DISCHARGE SUMMARY  Visits from Start of Care: 6  Current functional level related to goals / functional outcomes: Felt good with the pool but was still having difficulty    Remaining deficits: Difficulty with gait and knee pain   Education / Equipment: HEP   Patient agrees to discharge. Patient goals were not met. Patient is being discharged due to  Per MD note will be transfering to PT at her ALF .    Carney Living PT DPT  06/19/2021, 8:47 AM

## 2021-06-25 ENCOUNTER — Ambulatory Visit (HOSPITAL_BASED_OUTPATIENT_CLINIC_OR_DEPARTMENT_OTHER): Payer: Medicare Other | Admitting: Physical Therapy

## 2021-06-26 DIAGNOSIS — F32A Depression, unspecified: Secondary | ICD-10-CM

## 2021-06-26 DIAGNOSIS — I110 Hypertensive heart disease with heart failure: Secondary | ICD-10-CM

## 2021-06-26 DIAGNOSIS — G20 Parkinson's disease: Secondary | ICD-10-CM

## 2021-06-26 DIAGNOSIS — I5030 Unspecified diastolic (congestive) heart failure: Secondary | ICD-10-CM | POA: Diagnosis not present

## 2021-06-26 DIAGNOSIS — E785 Hyperlipidemia, unspecified: Secondary | ICD-10-CM

## 2021-06-26 DIAGNOSIS — M810 Age-related osteoporosis without current pathological fracture: Secondary | ICD-10-CM | POA: Diagnosis not present

## 2021-06-27 DIAGNOSIS — I509 Heart failure, unspecified: Secondary | ICD-10-CM | POA: Diagnosis not present

## 2021-06-27 DIAGNOSIS — R600 Localized edema: Secondary | ICD-10-CM | POA: Diagnosis not present

## 2021-07-01 ENCOUNTER — Telehealth: Payer: Medicare Other

## 2021-07-02 ENCOUNTER — Ambulatory Visit: Payer: Medicare Other | Admitting: Family Medicine

## 2021-07-07 DIAGNOSIS — M25532 Pain in left wrist: Secondary | ICD-10-CM | POA: Diagnosis not present

## 2021-07-07 DIAGNOSIS — M546 Pain in thoracic spine: Secondary | ICD-10-CM | POA: Diagnosis not present

## 2021-07-07 DIAGNOSIS — M545 Low back pain, unspecified: Secondary | ICD-10-CM | POA: Diagnosis not present

## 2021-07-07 DIAGNOSIS — M47892 Other spondylosis, cervical region: Secondary | ICD-10-CM | POA: Diagnosis not present

## 2021-07-07 DIAGNOSIS — M542 Cervicalgia: Secondary | ICD-10-CM | POA: Diagnosis not present

## 2021-07-07 DIAGNOSIS — W19XXXA Unspecified fall, initial encounter: Secondary | ICD-10-CM | POA: Diagnosis not present

## 2021-07-09 ENCOUNTER — Ambulatory Visit: Payer: Medicare Other | Admitting: Physical Therapy

## 2021-07-10 ENCOUNTER — Ambulatory Visit: Payer: Medicare Other | Admitting: Family Medicine

## 2021-07-18 DIAGNOSIS — F32A Depression, unspecified: Secondary | ICD-10-CM | POA: Diagnosis not present

## 2021-07-18 DIAGNOSIS — F411 Generalized anxiety disorder: Secondary | ICD-10-CM | POA: Diagnosis not present

## 2021-07-23 NOTE — Progress Notes (Signed)
[facility] Phone: [phone] Subjective:   [name], am serving as a scribe for Dr. Hulan Saas. This visit occurred during the SARS-CoV-2 public health emergency.  Safety protocols were in place, including screening questions prior to the visit, additional usage of staff PPE, and extensive cleaning of exam room while observing appropriate contact time as indicated for disinfecting solutions.   I'm seeing this patient by the request  of:  [name], MD  CC: knee pain and multiple other complaints  [id number]  04/30/2021 Significant arthritic changes with significant other comorbidities.  Patient is not a surgical candidate for multiple different reasons.  Patient is an increase in fall risk secondary to the Parkinson's as well as the arthritic changes.  Discussed with patient at great length as well as with her husband that I [name] agree that a motorized wheelchair would be beneficial but at the same time to see if there is anything else we can [name] to help her with some balance and coordination that will help her have confidence with at least mild walking.  Patient will be referred to aquatic therapy.  Patient will follow up with me again in 2 months and could potentially [name] virtual.  Patient does have weakness noted in the lower extremities with poor balance.  Patient does have significant deconditioning.  He does have Parkinson's and per notes he has a pain to be getting closer to end-stage.  Patient is being fitted for a motorized wheelchair which I think is a good idea but at the same time he would like patient to keep some muscular benefit as well.  Discussed with patient about the possibility of aquatic therapy.  Patient will consider this and hopefully will continue in the long run.  Depending on how patient is doing with this then we will talk about land therapy thereafter.  Patient is in assisted living  facility.  [name] not know how much significant improvement patient will be able to go to stand out any progression of weakness as well as help with balance and coordination to decrease risk of fall.  Total time talking with patient as well as husband 32 minutes.  Updated 07/24/2021 [name] is a 86 y.o. female coming in with complaint of bilateral knee pain. Did a few sessions of physical therapy in the pool. Pain is ok recently. Using Aspercreme which helps. Not walking as much so knees are not as painful. Was having L ankle pain but did extra 15 minutes of therapy one day but her pain has subsided. States that nurse put patient on pill for fluid build up.   Patient fell 2 weeks ago and is now having B elbow pain as she fell back on elbows. Also experiencing pain in R scapula that radiates into chest. Broke L pinky in fall. Is getting stronger when using arms to get in and out of bed. Hit head. Denies any headaches, dizziness or fogginess.        Past Medical History:  Diagnosis Date   Allergy    Anemia    Cancer of left breast ([place]) 1978   CHF (congestive heart failure) ([place])    Chronic thoracic back pain    "T8; fracture; 03/2015; no OR" (05/05/2016)   GERD (gastroesophageal reflux disease)    Heart murmur    History of hiatal hernia    Hyperlipidemia    Hyperplastic colon polyp    Hypertension  IBS (irritable bowel syndrome)    Multiple thyroid nodules    Osteoporosis    T8 compression fx 03/2015    Personal history of radiation therapy    Presence of permanent cardiac pacemaker    Vitamin B12 deficiency    Past Surgical History:  Procedure Laterality Date   ANTERIOR CERVICAL DECOMP/DISCECTOMY FUSION  2008   C5-6   AUGMENTATION MAMMAPLASTY     BACK SURGERY     BREAST SURGERY     CARDIAC CATHETERIZATION  2001   EP IMPLANTABLE DEVICE N/A 05/05/2016   Procedure: Pacemaker Implant;  Surgeon: Evans Lance, MD;  Location: [facility];  Service: Cardiovascular;   Laterality: N/A;   EXCISIONAL HEMORRHOIDECTOMY  1984   With subsequent correction of surgery   INSERT / REPLACE / REMOVE PACEMAKER     INTRAVASCULAR PRESSURE WIRE/FFR STUDY N/A 10/07/2019   Procedure: INTRAVASCULAR PRESSURE WIRE/FFR STUDY;  Surgeon: Jettie Booze, MD;  Location: [facility];  Service: Cardiovascular;  Laterality: N/A;   KNEE ARTHROSCOPY Left    "meniscus tear"   LEFT HEART CATH AND CORONARY ANGIOGRAPHY N/A 10/07/2019   Procedure: LEFT HEART CATH AND CORONARY ANGIOGRAPHY;  Surgeon: Jettie Booze, MD;  Location: [facility];  Service: Cardiovascular;  Laterality: N/A;   MASTECTOMY Left 1978   PLACEMENT OF BREAST IMPLANTS Left 1981   REDUCTION MAMMAPLASTY Right    SHOULDER ARTHROSCOPY W/ ROTATOR CUFF REPAIR Left 02/2006   "tear"   TUBAL LIGATION     Social History   Socioeconomic History   Marital status: Married    Spouse name: Not on file   Number of children: 2   Years of education: Not on file   Highest education level: Not on file  Occupational History   Occupation: retired  Tobacco Use   Smoking status: Never   Smokeless tobacco: Never  Vaping Use   Vaping Use: Never used  Substance and Sexual Activity   Alcohol use: No    Alcohol/week: 0.0 standard drinks   Drug use: No   Sexual activity: Not Currently  Other Topics Concern   Not on file  Social History Narrative   Housewife, Lives with spouse, 2 children   Social Determinants of Health   Financial Resource Strain: Low Risk    Difficulty of Paying Living Expenses: Not hard at all  Food Insecurity: No Food Insecurity   Worried About Charity fundraiser in the Last Year: Never true   Arboriculturist in the Last Year: Never true  Transportation Needs: No Transportation Needs   Lack of Transportation (Medical): No   Lack of Transportation (Non-Medical): No  Physical Activity: Inactive   Days of Exercise per Week: 0 days   Minutes of Exercise per Session: 0 min  Stress:  No Stress Concern Present   Feeling of Stress : Not at all  Social Connections: Socially Integrated   Frequency of Communication with Friends and Family: More than three times a week   Frequency of Social Gatherings with Friends and Family: More than three times a week   Attends Religious Services: 1 to 4 times per year   Active Member of [employer] or Organizations: No   Attends Music therapist: 1 to 4 times per year   Marital Status: Married   Allergies  Allergen Reactions   Statins     Lipitor caused hospitalization - - depletion of electrolytes, fever, nausea, loss of appetite, couldn't get out of  bed   Cymbalta [Duloxetine Hcl] Other (See Comments)    Dulled her too much, difficulty urinating, change in vision   Fosamax [Alendronate Sodium] Other (See Comments)    Caused chronic issues swallowing   Amitriptyline     hallucinations   Lyrica [Pregabalin]     Unknown   Neurontin [Gabapentin] Other (See Comments)    Dizziness and sedation (patient is tolerating in lower dose)   Zanaflex [Tizanidine] Other (See Comments)    Could not sleep   Family History  Problem Relation Age of Onset   Stroke Sister    Diabetes Brother    Breast cancer Maternal Aunt    Atrial fibrillation Sister    Colon cancer Neg Hx    Stomach cancer Neg Hx     Current Outpatient Medications (Endocrine & Metabolic):    denosumab (PROLIA) 60 MG/ML SOSY injection, Inject 60 mg into the skin every 6 (six) months.  Current Outpatient Medications (Cardiovascular):    ezetimibe (ZETIA) 10 MG tablet, TAKE ONE TABLET BY MOUTH EVERY MORNING   metoprolol succinate (TOPROL XL) 25 MG 24 hr tablet, Take 0.5 tablets (12.5 mg total) by mouth daily.   torsemide (DEMADEX) 5 MG tablet, Take 5 mg by mouth daily.   Current Outpatient Medications (Analgesics):    acetaminophen (TYLENOL) 500 MG tablet, Take 500 mg by mouth 2 (two) times daily.  Current Outpatient Medications (Hematological):     Cyanocobalamin 1000 MCG TBCR, Take 1,000 mcg by mouth daily.    ferrous sulfate 325 (65 FE) MG tablet, Take 325 mg by mouth 3 (three) times a week.  Current Outpatient Medications (Other):    busPIRone (BUSPAR) 10 MG tablet, Take 1 tablet (10 mg total) by mouth 3 (three) times daily as needed.   cholecalciferol (VITAMIN D) 1000 UNITS tablet, Take 2,000 Units by mouth daily.    diclofenac Sodium (VOLTAREN) 1 % GEL, Apply 2 g topically 4 (four) times daily.   escitalopram (LEXAPRO) 10 MG tablet, Take 1 tablet (10 mg total) by mouth daily.   Misc. Devices (WHEELCHAIR) MISC, Power Wheelchair   omeprazole (PRILOSEC) 40 MG capsule, TAKE 1 CAPSULE BY MOUTH TWICE A DAY FOR GERD.   Polyethyl Glycol-Propyl Glycol (LUBRICANT EYE DROPS) 0.4-0.3 % SOLN, Place 1 drop into both eyes 3 (three) times daily as needed (dry/irritated eyes.).   polyethylene glycol (MIRALAX / GLYCOLAX) packet, Take 17 g by mouth daily as needed (constipation).   Reviewed prior external information including notes and imaging from  primary care provider As well as notes that were available from care everywhere and other healthcare systems.  Past medical history, social, surgical and family history all reviewed in electronic medical record.  No pertanent information unless stated regarding to the chief complaint.   Review of Systems:  No headache, visual changes, nausea, vomiting, diarrhea, constipation, dizziness, abdominal pain, skin rash, fevers, chills, night sweats, weight loss, swollen lymph nodes,  joint swelling, chest pain, shortness of breath, mood changes. POSITIVE muscle aches, body aches  Objective  Blood pressure (!) 132/56, pulse 68, height 5\' 1"  (1.549 m), weight 153 lb (69.4 kg), SpO2 96 %.   General: No apparent distress alert and oriented x3 mood and affect normal, dressed appropriately.  HEENT: Pupils equal, extraocular movements intact  Respiratory: Patient's speak in full sentences and does not appear short  of breath  Cardiovascular: No lower extremity edema, non tender, no erythema  Gait patient is seated in a wheelchair.  Patient has good range of motion of  the elbows bilaterally.  Very mild tenderness to palpation in the area.  No pain over the radial head.  Good grip strength noted.  Patient does have low hypertonicity noted of the musculature.  Patient still has weakness of all the extremities with a 4 out of 5 strength but seems to be symmetric.  Patient does appear to be somewhat deconditioned.   Impression and Recommendations:    The above documentation has been reviewed and is accurate and complete [name] Pulley, [name]

## 2021-07-24 ENCOUNTER — Encounter: Payer: Self-pay | Admitting: Family Medicine

## 2021-07-24 ENCOUNTER — Ambulatory Visit (INDEPENDENT_AMBULATORY_CARE_PROVIDER_SITE_OTHER): Payer: Medicare Other | Admitting: Family Medicine

## 2021-07-24 ENCOUNTER — Other Ambulatory Visit: Payer: Self-pay

## 2021-07-24 VITALS — BP 132/56 | HR 68 | Ht 61.0 in | Wt 153.0 lb

## 2021-07-24 DIAGNOSIS — M25562 Pain in left knee: Secondary | ICD-10-CM

## 2021-07-24 DIAGNOSIS — M25561 Pain in right knee: Secondary | ICD-10-CM | POA: Diagnosis not present

## 2021-07-24 DIAGNOSIS — G8929 Other chronic pain: Secondary | ICD-10-CM

## 2021-07-24 DIAGNOSIS — R29898 Other symptoms and signs involving the musculoskeletal system: Secondary | ICD-10-CM | POA: Diagnosis not present

## 2021-07-24 NOTE — Patient Instructions (Signed)
See me again in 2 months Will order PT at [facility]

## 2021-07-24 NOTE — Assessment & Plan Note (Signed)
Patient continues to have some deconditioning.  Do feel that formal physical therapy will be beneficial.  Patient is now in a assisted living facility.  Patient is accompanied with daughter who does agree with the plan at the moment.  Discussed with patient about other possibilities such as aquatic therapy.  I believe the patient though has been under significant more stress recently secondary to her husband.  I think this is causing more confusion and her not focusing on herself as much again.  We will make no significant changes in medication.  Total time with patient today 31 minutes

## 2021-07-30 ENCOUNTER — Ambulatory Visit (INDEPENDENT_AMBULATORY_CARE_PROVIDER_SITE_OTHER): Payer: Medicare Other

## 2021-07-30 DIAGNOSIS — I495 Sick sinus syndrome: Secondary | ICD-10-CM | POA: Diagnosis not present

## 2021-07-31 LAB — CUP PACEART REMOTE DEVICE CHECK
Battery Remaining Longevity: 102 mo
Battery Remaining Percentage: 100 %
Brady Statistic RA Percent Paced: 62 %
Brady Statistic RV Percent Paced: 0 %
Date Time Interrogation Session: 20230103030100
Implantable Lead Implant Date: 20171009
Implantable Lead Implant Date: 20171009
Implantable Lead Location: 753859
Implantable Lead Location: 753860
Implantable Lead Model: 5076
Implantable Lead Model: 7741
Implantable Lead Serial Number: 785430
Implantable Pulse Generator Implant Date: 20171009
Lead Channel Impedance Value: 1777 Ohm
Lead Channel Impedance Value: 375 Ohm
Lead Channel Pacing Threshold Amplitude: 2 V
Lead Channel Pacing Threshold Pulse Width: 0.4 ms
Lead Channel Setting Pacing Amplitude: 2 V
Lead Channel Setting Pacing Amplitude: 2.5 V
Lead Channel Setting Pacing Pulse Width: 0.4 ms
Lead Channel Setting Sensing Sensitivity: 2.5 mV
Pulse Gen Serial Number: 766467

## 2021-08-09 NOTE — Progress Notes (Signed)
Remote pacemaker transmission.   

## 2021-08-13 ENCOUNTER — Other Ambulatory Visit: Payer: Self-pay

## 2021-08-13 ENCOUNTER — Ambulatory Visit: Payer: Medicare Other | Attending: Internal Medicine | Admitting: Physical Therapy

## 2021-08-13 DIAGNOSIS — M6281 Muscle weakness (generalized): Secondary | ICD-10-CM | POA: Diagnosis not present

## 2021-08-13 DIAGNOSIS — R2689 Other abnormalities of gait and mobility: Secondary | ICD-10-CM

## 2021-08-13 DIAGNOSIS — R2681 Unsteadiness on feet: Secondary | ICD-10-CM

## 2021-08-14 ENCOUNTER — Encounter: Payer: Self-pay | Admitting: Physical Therapy

## 2021-08-14 NOTE — Therapy (Signed)
[facility] [address] [place], [place], [address] Phone: [phone]   Fax:  [phone]  Physical Therapy Evaluation  Patient Details  Name: [name] MRN: [id number] Date of Birth: 1934/08/31 Referring Provider (PT): [name], DO   Encounter Date: 08/13/2021   PT End of Session - 08/14/21 1820     Visit Number 1    Number of Visits 1    Authorization Type Medicare    Authorization Time Period 08-13-21 - 09-13-21    PT Start Time 1315    PT Stop Time 1435    PT Time Calculation (min) 80 min    Equipment Utilized During Treatment Other (comment)   power wheelchair was trialed during the eval   Activity Tolerance Patient tolerated treatment well    Behavior During Therapy [facility] for tasks assessed/performed             Past Medical History:  Diagnosis Date   Allergy    Anemia    Cancer of left breast ([place]) 1978   CHF (congestive heart failure) ([place])    Chronic thoracic back pain    "T8; fracture; 03/2015; no OR" (05/05/2016)   GERD (gastroesophageal reflux disease)    Heart murmur    History of hiatal hernia    Hyperlipidemia    Hyperplastic colon polyp    Hypertension    IBS (irritable bowel syndrome)    Multiple thyroid nodules    Osteoporosis    T8 compression fx 03/2015    Personal history of radiation therapy    Presence of permanent cardiac pacemaker    Vitamin B12 deficiency     Past Surgical History:  Procedure Laterality Date   ANTERIOR CERVICAL DECOMP/DISCECTOMY FUSION  2008   C5-6   AUGMENTATION MAMMAPLASTY     BACK SURGERY     BREAST SURGERY     CARDIAC CATHETERIZATION  2001   EP IMPLANTABLE DEVICE N/A 05/05/2016   Procedure: Pacemaker Implant;  Surgeon: Evans Lance, MD;  Location: [facility];  Service: Cardiovascular;  Laterality: N/A;   EXCISIONAL HEMORRHOIDECTOMY  1984   With subsequent correction of surgery   INSERT / REPLACE / REMOVE PACEMAKER      INTRAVASCULAR PRESSURE WIRE/FFR STUDY N/A 10/07/2019   Procedure: INTRAVASCULAR PRESSURE WIRE/FFR STUDY;  Surgeon: Jettie Booze, MD;  Location: [facility];  Service: Cardiovascular;  Laterality: N/A;   KNEE ARTHROSCOPY Left    "meniscus tear"   LEFT HEART CATH AND CORONARY ANGIOGRAPHY N/A 10/07/2019   Procedure: LEFT HEART CATH AND CORONARY ANGIOGRAPHY;  Surgeon: Jettie Booze, MD;  Location: [facility];  Service: Cardiovascular;  Laterality: N/A;   MASTECTOMY Left 1978   PLACEMENT OF BREAST IMPLANTS Left 1981   REDUCTION MAMMAPLASTY Right    SHOULDER ARTHROSCOPY W/ ROTATOR CUFF REPAIR Left 02/2006   "tear"   TUBAL LIGATION      There were no vitals filed for this visit.    Subjective Assessment - 08/14/21 1811     Subjective Pt presents to PT for a power wheelchair evaluation - is using a transport wheelchair; accompanied by her daughter; vendor, Josh Cadle, [employer] with [employer] present for eval    Patient Stated Goals obtain power wheelchair    Currently in Pain? No/denies                [facility] PT Assessment - 08/14/21 0001       Assessment   Medical  Diagnosis Parkinsonism    Referring Provider (PT) Wells Guiles Tat, DO    Onset Date/Surgical Date --   approx. 2 yrs ago for decline in mobility   Hand Dominance Right    Prior Therapy pt had OP PT at this facility from 09-10-20 - 12-04-20; pt also had aquatic therapy at [facility] in Oct. - Nov. 2022 (4 sessions) due to moving to [place]      Precautions   Precautions Fall    Precaution Comments pt has tendency to fall backward upon initial standing      Restrictions   Weight Bearing Restrictions No      Balance Screen   Has the patient fallen in the past 6 months Yes    How many times? 3    Has the patient had a decrease in activity level because of a fear of falling?  Yes    Is the patient reluctant to leave their home because of a fear of falling?  Yes               LMN for power wheelchair with power tilt to be completed when quote received from vendor           Objective measurements completed on examination: See above findings.                             Plan - 08/14/21 1821     Clinical Impression Statement Pt was evaluated for power wheelchair with power tilt ([employer]retto); pt able to safely operate and maneuver power wheelchair in the clinic.  LMN for power wheelchair to be completed - Josh Cadle, [employer] with [employer] present for eval.    Personal Factors and Comorbidities Comorbidity 2;Fitness;Past/Current Experience;Time since onset of injury/illness/exacerbation    Comorbidities Parkinsonism, CHF, chronic thoracic back pain, osteoporosis, HTN    Examination-Activity Limitations Bathing;Locomotion Level;Transfers;Bend;Squat;Stairs;Stand;Lift;Hygiene/Grooming;Dressing;Toileting    Examination-Participation Restrictions Meal Prep;Cleaning;Community Activity;Laundry;Shop    Stability/Clinical Decision Making Evolving/Moderate complexity    Clinical Decision Making Moderate    PT Frequency One time visit    PT Treatment/Interventions Wheelchair mobility training    Recommended Other Services obtain power wheelchair through [place] and Agree with Plan of Care Patient;Family member/caregiver    Family Member Consulted daughter             Patient will benefit from skilled therapeutic intervention in order to improve the following deficits and impairments:  Difficulty walking, Decreased activity tolerance, Decreased balance, Decreased strength  Visit Diagnosis: Unsteadiness on feet - Plan: PT plan of care cert/re-cert  Muscle weakness (generalized) - Plan: PT plan of care cert/re-cert  Other abnormalities of gait and mobility - Plan: PT plan of care cert/re-cert     Problem List Patient Active Problem List   Diagnosis Date Noted   IBS (irritable bowel syndrome) 10/28/2020    Tremor of both hands 07/30/2020   Left arm swelling 04/10/2020   Aortic atherosclerosis ([place]) 03/30/2020   Cellulitis of arm, left 03/30/2020   Chronic thoracic back pain 02/03/2020   Iron deficiency anemia 02/02/2020   Near syncope 09/27/2019   Hypertension 09/20/2019   Acute pain of right knee 08/03/2019   (HFpEF) heart failure with preserved ejection fraction ([place]) 08/02/2019   Pacemaker 07/19/2019   CKD (chronic kidney disease) 01/30/2019   Lumbar spinal stenosis 01/10/2018   Degenerative arthritis of knee, bilateral 12/15/2017   Epigastric pain 09/09/2017   Neuralgia 09/09/2017  Claudication of calf muscles (HCC) 11/22/2016   DOE (dyspnea on exertion) 11/22/2016   Hyperreflexia of lower extremity 07/16/2016   Bilateral leg paresthesia 07/16/2016   Carotid arterial disease ([place]) 07/13/2016   Poor balance 06/23/2016   Weakness of both lower extremities 06/23/2016   Sinus node dysfunction (HCC) 05/05/2016   Right shoulder pain 03/14/2016   Multinodular goiter (nontoxic) 02/21/2016   Neck pain 02/15/2016   Prediabetes 12/17/2015   Dysphagia 12/17/2015   Anxiety 11/13/2015   T8 vertebral fracture ([place]) 04/09/2015   Overweight 10/09/2011   Allergic rhinitis, cause unspecified    GERD (gastroesophageal reflux disease) 02/05/2011   Vitamin B 12 deficiency 04/17/2009   Hyperlipidemia 04/17/2009   Osteoporosis 04/17/2009   ADENOCARCINOMA, BREAST, HX OF 04/17/2009   Constipation 03/08/2009   COLONIC POLYPS, HYPERPLASTIC, HX OF 03/06/2009    Alda Lea, PT 08/14/2021, 6:32 PM  [facility] [address] [place], [place], [address] Phone: [phone]   Fax:  [phone]  Name: DEJON JUNGMAN MRN: [id number] Date of Birth: Dec 22, 1934

## 2021-08-19 DIAGNOSIS — F0284 Dementia in other diseases classified elsewhere, unspecified severity, with anxiety: Secondary | ICD-10-CM | POA: Diagnosis not present

## 2021-08-19 DIAGNOSIS — I130 Hypertensive heart and chronic kidney disease with heart failure and stage 1 through stage 4 chronic kidney disease, or unspecified chronic kidney disease: Secondary | ICD-10-CM | POA: Diagnosis not present

## 2021-08-19 DIAGNOSIS — I5030 Unspecified diastolic (congestive) heart failure: Secondary | ICD-10-CM | POA: Diagnosis not present

## 2021-08-19 DIAGNOSIS — E538 Deficiency of other specified B group vitamins: Secondary | ICD-10-CM | POA: Diagnosis not present

## 2021-08-19 DIAGNOSIS — F0283 Dementia in other diseases classified elsewhere, unspecified severity, with mood disturbance: Secondary | ICD-10-CM | POA: Diagnosis not present

## 2021-08-19 DIAGNOSIS — Z9181 History of falling: Secondary | ICD-10-CM | POA: Diagnosis not present

## 2021-08-19 DIAGNOSIS — E039 Hypothyroidism, unspecified: Secondary | ICD-10-CM | POA: Diagnosis not present

## 2021-08-19 DIAGNOSIS — M810 Age-related osteoporosis without current pathological fracture: Secondary | ICD-10-CM | POA: Diagnosis not present

## 2021-08-19 DIAGNOSIS — N189 Chronic kidney disease, unspecified: Secondary | ICD-10-CM | POA: Diagnosis not present

## 2021-08-19 DIAGNOSIS — F32A Depression, unspecified: Secondary | ICD-10-CM | POA: Diagnosis not present

## 2021-08-19 DIAGNOSIS — Z950 Presence of cardiac pacemaker: Secondary | ICD-10-CM | POA: Diagnosis not present

## 2021-08-19 DIAGNOSIS — D649 Anemia, unspecified: Secondary | ICD-10-CM | POA: Diagnosis not present

## 2021-08-19 DIAGNOSIS — K581 Irritable bowel syndrome with constipation: Secondary | ICD-10-CM | POA: Diagnosis not present

## 2021-08-19 DIAGNOSIS — K219 Gastro-esophageal reflux disease without esophagitis: Secondary | ICD-10-CM | POA: Diagnosis not present

## 2021-08-19 DIAGNOSIS — Z8616 Personal history of COVID-19: Secondary | ICD-10-CM | POA: Diagnosis not present

## 2021-08-19 DIAGNOSIS — E7800 Pure hypercholesterolemia, unspecified: Secondary | ICD-10-CM | POA: Diagnosis not present

## 2021-08-20 DIAGNOSIS — I130 Hypertensive heart and chronic kidney disease with heart failure and stage 1 through stage 4 chronic kidney disease, or unspecified chronic kidney disease: Secondary | ICD-10-CM | POA: Diagnosis not present

## 2021-08-20 DIAGNOSIS — M810 Age-related osteoporosis without current pathological fracture: Secondary | ICD-10-CM | POA: Diagnosis not present

## 2021-08-20 DIAGNOSIS — I5030 Unspecified diastolic (congestive) heart failure: Secondary | ICD-10-CM | POA: Diagnosis not present

## 2021-08-20 DIAGNOSIS — N189 Chronic kidney disease, unspecified: Secondary | ICD-10-CM | POA: Diagnosis not present

## 2021-08-20 DIAGNOSIS — E538 Deficiency of other specified B group vitamins: Secondary | ICD-10-CM | POA: Diagnosis not present

## 2021-08-20 DIAGNOSIS — F0284 Dementia in other diseases classified elsewhere, unspecified severity, with anxiety: Secondary | ICD-10-CM | POA: Diagnosis not present

## 2021-08-21 NOTE — Progress Notes (Signed)
Assessment/Plan:   1.  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  -cannot do MRI brain due to PPM that is not MRI compatible.  -DaT scan positive, with decreased uptake in bilateral putamen  -following with Dr. Renda Rolls  -Awaiting motorized WC.  Patient states that she had evaluation about a week ago and they stated they would send the forms to me for signature.  I have not received those and will check on them.  -Long discussion regarding her advanced directives.  Patient does not want intubation/ventilation if it came down to it.  She would want short-term feeding tube but not long-term.  Told her that I want her and her husband to talk about these at length.  He reports that he would want to be DNR.  Discussed that her swallowing will likely continue to deteriorate, and want to make sure that her advanced directives are congruent with the reality of her disease state and long-term recommendations (for example, a short-term feeding tube would likely never be a reality in this disease state).  They are to talk about this at home and talk with me about it more next visit.   2.  Orthostatic hypotension  -improved.  Discussed concept of permissive hypertension  -Following with cardiology.  Lasix has been discontinued.  Metoprolol has been cut in half.    3.  GAD/depression  -on buspar  -continue lexapro 10 mg daily   4.  Confusion  -Patient canceled neurocognitive testing.  5.  Dysphagia  -MBE was completed on March 06, 2021.  Swallowing function was worse than 1 year prior.  There was evidence of oropharyngeal and pharyngeal esophageal phase dysphagia mechanical soft solids, thin liquids, meds crushed with pure recommended.  Patient thinks that this is getting worse.  I will schedule another repeat prior to our next visit.   Subjective:   [name] was seen today in follow up for newly diagnosed parkinsonism.  Patient with her  husband who supplements the history.  Daughter on phone who supplements history.  Levodopa was discontinued after our last visit as they did not think it was particularly helpful.  She has been attending physical therapies.  Those notes are reviewed.  She had neurocognitive testing scheduled in November, but they ultimately canceled that.  Had one big fall - was in the bathroom and fell backwards and head hit bottom of shower.  No LOC.  They live in assisted living and they pulled the emergency cord and someone came and helped her get up.  She has not fallen since then.  states that had motorized WC eval a week ago and was told they just needed to sign off on it.  I haven't received and patient asks about that.  Current movement disorder medications: Lexapro, 10 mg daily (increased last visit)  Prior medications: Levodopa (discontinued because not helpful)  ALLERGIES:   Allergies  Allergen Reactions   Statins     Lipitor caused hospitalization - - depletion of electrolytes, fever, nausea, loss of appetite, couldn't get out of bed   Cymbalta [Duloxetine Hcl] Other (See Comments)    Dulled her too much, difficulty urinating, change in vision   Fosamax [Alendronate Sodium] Other (See Comments)    Caused chronic issues swallowing   Amitriptyline     hallucinations   Lyrica [Pregabalin]     Unknown   Neurontin [Gabapentin] Other (See Comments)    Dizziness and sedation (patient is tolerating in lower dose)  Zanaflex [Tizanidine] Other (See Comments)    Could not sleep    CURRENT MEDICATIONS:  Outpatient Encounter Medications as of 08/22/2021  Medication Sig   acetaminophen (TYLENOL) 500 MG tablet Take 500 mg by mouth 2 (two) times daily.   busPIRone (BUSPAR) 10 MG tablet Take 1 tablet (10 mg total) by mouth 3 (three) times daily as needed.   cholecalciferol (VITAMIN D) 1000 UNITS tablet Take 2,000 Units by mouth daily.    Cyanocobalamin 1000 MCG TBCR Take 1,000 mcg by mouth daily.     denosumab (PROLIA) 60 MG/ML SOSY injection Inject 60 mg into the skin every 6 (six) months.   diclofenac Sodium (VOLTAREN) 1 % GEL Apply 2 g topically 4 (four) times daily.   escitalopram (LEXAPRO) 10 MG tablet Take 1 tablet (10 mg total) by mouth daily.   ezetimibe (ZETIA) 10 MG tablet TAKE ONE TABLET BY MOUTH EVERY MORNING   ferrous sulfate 325 (65 FE) MG tablet Take 325 mg by mouth 3 (three) times a week.   metoprolol succinate (TOPROL XL) 25 MG 24 hr tablet Take 0.5 tablets (12.5 mg total) by mouth daily.   Misc. Devices [facility]) MISC Power Wheelchair   omeprazole (PRILOSEC) 40 MG capsule TAKE 1 CAPSULE BY MOUTH TWICE A DAY FOR GERD.   Polyethyl Glycol-Propyl Glycol (LUBRICANT EYE DROPS) 0.4-0.3 % SOLN Place 1 drop into both eyes 3 (three) times daily as needed (dry/irritated eyes.).   polyethylene glycol (MIRALAX / GLYCOLAX) packet Take 17 g by mouth daily as needed (constipation).   torsemide (DEMADEX) 5 MG tablet Take 5 mg by mouth daily.   No facility-administered encounter medications on file as of 08/22/2021.    Objective:   PHYSICAL EXAMINATION:    VITALS:   Vitals:   08/22/21 1301  BP: 126/62  Pulse: 60  SpO2: 97%  Weight: 153 lb (69.4 kg)  Height: 5\' 1"  (1.549 m)      No data found.   GEN:  The patient appears stated age and is in NAD. HEENT:  Normocephalic, atraumatic.  The mucous membranes are moist.   Neurological examination:  Orientation: The patient is alert and oriented x3. Cranial nerves: There is good facial symmetry with facial hypomimia.  Extraocular muscles are intact.  She does have decreased blink.  The speech is fluent and clear.  Gait:  not tested today due to previous experience (see prior note).  In [place].   I have reviewed and interpreted the following labs independently    Chemistry      Component Value Date/Time   NA 134 (L) 01/15/2021 1459   NA 135 10/05/2020 1150   K 4.2 01/15/2021 1459   CL 102 01/15/2021 1459   CO2 25 01/15/2021  1459   BUN 9 01/15/2021 1459   BUN 12 10/05/2020 1150   CREATININE 0.87 01/15/2021 1459   CREATININE 1.23 (H) 03/30/2020 1011      Component Value Date/Time   CALCIUM 8.7 01/15/2021 1459   ALKPHOS 34 (L) 01/15/2021 1459   AST 12 01/15/2021 1459   ALT 4 01/15/2021 1459   BILITOT 0.3 01/15/2021 1459       Lab Results  Component Value Date   WBC 6.7 01/15/2021   HGB 12.0 01/15/2021   HCT 35.4 (L) 01/15/2021   MCV 96.3 01/15/2021   PLT 308.0 01/15/2021    Lab Results  Component Value Date   TSH 1.85 02/03/2020     Total time spent on today's visit was 50 minutes, including both  face-to-face time and nonface-to-face time.  Time included that spent on review of records (prior notes available to me/labs/imaging if pertinent), discussing treatment and goals, answering patient's questions and coordinating care.  Cc:  [name], MD

## 2021-08-22 ENCOUNTER — Encounter: Payer: Self-pay | Admitting: Neurology

## 2021-08-22 ENCOUNTER — Other Ambulatory Visit: Payer: Self-pay

## 2021-08-22 ENCOUNTER — Ambulatory Visit (INDEPENDENT_AMBULATORY_CARE_PROVIDER_SITE_OTHER): Payer: Medicare Other | Admitting: Neurology

## 2021-08-22 VITALS — BP 126/62 | HR 60 | Ht 61.0 in | Wt 153.0 lb

## 2021-08-22 DIAGNOSIS — E559 Vitamin D deficiency, unspecified: Secondary | ICD-10-CM | POA: Diagnosis not present

## 2021-08-22 DIAGNOSIS — D509 Iron deficiency anemia, unspecified: Secondary | ICD-10-CM | POA: Diagnosis not present

## 2021-08-22 DIAGNOSIS — G231 Progressive supranuclear ophthalmoplegia [Steele-Richardson-Olszewski]: Secondary | ICD-10-CM

## 2021-08-22 DIAGNOSIS — R1310 Dysphagia, unspecified: Secondary | ICD-10-CM

## 2021-08-22 DIAGNOSIS — I509 Heart failure, unspecified: Secondary | ICD-10-CM | POA: Diagnosis not present

## 2021-08-22 DIAGNOSIS — E785 Hyperlipidemia, unspecified: Secondary | ICD-10-CM | POA: Diagnosis not present

## 2021-08-22 DIAGNOSIS — G903 Multi-system degeneration of the autonomic nervous system: Secondary | ICD-10-CM | POA: Diagnosis not present

## 2021-08-22 DIAGNOSIS — E538 Deficiency of other specified B group vitamins: Secondary | ICD-10-CM | POA: Diagnosis not present

## 2021-08-28 DIAGNOSIS — E538 Deficiency of other specified B group vitamins: Secondary | ICD-10-CM | POA: Diagnosis not present

## 2021-08-28 DIAGNOSIS — I130 Hypertensive heart and chronic kidney disease with heart failure and stage 1 through stage 4 chronic kidney disease, or unspecified chronic kidney disease: Secondary | ICD-10-CM | POA: Diagnosis not present

## 2021-08-28 DIAGNOSIS — F0284 Dementia in other diseases classified elsewhere, unspecified severity, with anxiety: Secondary | ICD-10-CM | POA: Diagnosis not present

## 2021-08-28 DIAGNOSIS — N189 Chronic kidney disease, unspecified: Secondary | ICD-10-CM | POA: Diagnosis not present

## 2021-08-28 DIAGNOSIS — I5030 Unspecified diastolic (congestive) heart failure: Secondary | ICD-10-CM | POA: Diagnosis not present

## 2021-08-28 DIAGNOSIS — M810 Age-related osteoporosis without current pathological fracture: Secondary | ICD-10-CM | POA: Diagnosis not present

## 2021-08-30 DIAGNOSIS — I130 Hypertensive heart and chronic kidney disease with heart failure and stage 1 through stage 4 chronic kidney disease, or unspecified chronic kidney disease: Secondary | ICD-10-CM | POA: Diagnosis not present

## 2021-08-30 DIAGNOSIS — M810 Age-related osteoporosis without current pathological fracture: Secondary | ICD-10-CM | POA: Diagnosis not present

## 2021-08-30 DIAGNOSIS — N189 Chronic kidney disease, unspecified: Secondary | ICD-10-CM | POA: Diagnosis not present

## 2021-08-30 DIAGNOSIS — I5030 Unspecified diastolic (congestive) heart failure: Secondary | ICD-10-CM | POA: Diagnosis not present

## 2021-08-30 DIAGNOSIS — F0284 Dementia in other diseases classified elsewhere, unspecified severity, with anxiety: Secondary | ICD-10-CM | POA: Diagnosis not present

## 2021-08-30 DIAGNOSIS — E538 Deficiency of other specified B group vitamins: Secondary | ICD-10-CM | POA: Diagnosis not present

## 2021-09-03 DIAGNOSIS — E538 Deficiency of other specified B group vitamins: Secondary | ICD-10-CM | POA: Diagnosis not present

## 2021-09-03 DIAGNOSIS — N189 Chronic kidney disease, unspecified: Secondary | ICD-10-CM | POA: Diagnosis not present

## 2021-09-03 DIAGNOSIS — F0284 Dementia in other diseases classified elsewhere, unspecified severity, with anxiety: Secondary | ICD-10-CM | POA: Diagnosis not present

## 2021-09-03 DIAGNOSIS — I5030 Unspecified diastolic (congestive) heart failure: Secondary | ICD-10-CM | POA: Diagnosis not present

## 2021-09-03 DIAGNOSIS — I130 Hypertensive heart and chronic kidney disease with heart failure and stage 1 through stage 4 chronic kidney disease, or unspecified chronic kidney disease: Secondary | ICD-10-CM | POA: Diagnosis not present

## 2021-09-03 DIAGNOSIS — M810 Age-related osteoporosis without current pathological fracture: Secondary | ICD-10-CM | POA: Diagnosis not present

## 2021-09-04 DIAGNOSIS — M810 Age-related osteoporosis without current pathological fracture: Secondary | ICD-10-CM | POA: Diagnosis not present

## 2021-09-04 DIAGNOSIS — I130 Hypertensive heart and chronic kidney disease with heart failure and stage 1 through stage 4 chronic kidney disease, or unspecified chronic kidney disease: Secondary | ICD-10-CM | POA: Diagnosis not present

## 2021-09-04 DIAGNOSIS — F0284 Dementia in other diseases classified elsewhere, unspecified severity, with anxiety: Secondary | ICD-10-CM | POA: Diagnosis not present

## 2021-09-04 DIAGNOSIS — E538 Deficiency of other specified B group vitamins: Secondary | ICD-10-CM | POA: Diagnosis not present

## 2021-09-04 DIAGNOSIS — N189 Chronic kidney disease, unspecified: Secondary | ICD-10-CM | POA: Diagnosis not present

## 2021-09-04 DIAGNOSIS — I5030 Unspecified diastolic (congestive) heart failure: Secondary | ICD-10-CM | POA: Diagnosis not present

## 2021-09-05 DIAGNOSIS — M810 Age-related osteoporosis without current pathological fracture: Secondary | ICD-10-CM | POA: Diagnosis not present

## 2021-09-05 DIAGNOSIS — I130 Hypertensive heart and chronic kidney disease with heart failure and stage 1 through stage 4 chronic kidney disease, or unspecified chronic kidney disease: Secondary | ICD-10-CM | POA: Diagnosis not present

## 2021-09-05 DIAGNOSIS — F0284 Dementia in other diseases classified elsewhere, unspecified severity, with anxiety: Secondary | ICD-10-CM | POA: Diagnosis not present

## 2021-09-05 DIAGNOSIS — I5030 Unspecified diastolic (congestive) heart failure: Secondary | ICD-10-CM | POA: Diagnosis not present

## 2021-09-05 DIAGNOSIS — E538 Deficiency of other specified B group vitamins: Secondary | ICD-10-CM | POA: Diagnosis not present

## 2021-09-05 DIAGNOSIS — N189 Chronic kidney disease, unspecified: Secondary | ICD-10-CM | POA: Diagnosis not present

## 2021-09-06 DIAGNOSIS — I5030 Unspecified diastolic (congestive) heart failure: Secondary | ICD-10-CM | POA: Diagnosis not present

## 2021-09-06 DIAGNOSIS — M810 Age-related osteoporosis without current pathological fracture: Secondary | ICD-10-CM | POA: Diagnosis not present

## 2021-09-06 DIAGNOSIS — I130 Hypertensive heart and chronic kidney disease with heart failure and stage 1 through stage 4 chronic kidney disease, or unspecified chronic kidney disease: Secondary | ICD-10-CM | POA: Diagnosis not present

## 2021-09-06 DIAGNOSIS — F0284 Dementia in other diseases classified elsewhere, unspecified severity, with anxiety: Secondary | ICD-10-CM | POA: Diagnosis not present

## 2021-09-06 DIAGNOSIS — N189 Chronic kidney disease, unspecified: Secondary | ICD-10-CM | POA: Diagnosis not present

## 2021-09-06 DIAGNOSIS — E538 Deficiency of other specified B group vitamins: Secondary | ICD-10-CM | POA: Diagnosis not present

## 2021-09-09 ENCOUNTER — Telehealth: Payer: Self-pay

## 2021-09-09 NOTE — Telephone Encounter (Signed)
Pt ready for scheduling on or after 09/16/21  Out-of-pocket cost due at time of visit: $301  Primary: Medicare Prolia co-insurance: 20% (approximately $276) Admin fee co-insurance: 20% (approximately $25)  Secondary: AARP Medi Supp Prolia co-insurance: Covers Medicare Part B co-insurance and deductible.  Admin fee co-insurance: Covers Medicare Part B co-insurance and deductible.   Deductible: covered by secondary  Prior Auth: not required PA# Valid:   ** This summary of benefits is an estimation of the patient's out-of-pocket cost. Exact cost may vary based on individual plan coverage.

## 2021-09-09 NOTE — Telephone Encounter (Signed)
Prolia VOB initiated via [email or web address]  Last OV:  Next OV:  Last Prolia inj: 03/15/21 Next Prolia inj DUE: 09/16/21

## 2021-09-10 DIAGNOSIS — F0284 Dementia in other diseases classified elsewhere, unspecified severity, with anxiety: Secondary | ICD-10-CM | POA: Diagnosis not present

## 2021-09-10 DIAGNOSIS — I5030 Unspecified diastolic (congestive) heart failure: Secondary | ICD-10-CM | POA: Diagnosis not present

## 2021-09-10 DIAGNOSIS — M810 Age-related osteoporosis without current pathological fracture: Secondary | ICD-10-CM | POA: Diagnosis not present

## 2021-09-10 DIAGNOSIS — E538 Deficiency of other specified B group vitamins: Secondary | ICD-10-CM | POA: Diagnosis not present

## 2021-09-10 DIAGNOSIS — N189 Chronic kidney disease, unspecified: Secondary | ICD-10-CM | POA: Diagnosis not present

## 2021-09-10 DIAGNOSIS — I130 Hypertensive heart and chronic kidney disease with heart failure and stage 1 through stage 4 chronic kidney disease, or unspecified chronic kidney disease: Secondary | ICD-10-CM | POA: Diagnosis not present

## 2021-09-11 DIAGNOSIS — I130 Hypertensive heart and chronic kidney disease with heart failure and stage 1 through stage 4 chronic kidney disease, or unspecified chronic kidney disease: Secondary | ICD-10-CM | POA: Diagnosis not present

## 2021-09-11 DIAGNOSIS — I5030 Unspecified diastolic (congestive) heart failure: Secondary | ICD-10-CM | POA: Diagnosis not present

## 2021-09-11 DIAGNOSIS — F0284 Dementia in other diseases classified elsewhere, unspecified severity, with anxiety: Secondary | ICD-10-CM | POA: Diagnosis not present

## 2021-09-11 DIAGNOSIS — M810 Age-related osteoporosis without current pathological fracture: Secondary | ICD-10-CM | POA: Diagnosis not present

## 2021-09-11 DIAGNOSIS — E538 Deficiency of other specified B group vitamins: Secondary | ICD-10-CM | POA: Diagnosis not present

## 2021-09-11 DIAGNOSIS — N189 Chronic kidney disease, unspecified: Secondary | ICD-10-CM | POA: Diagnosis not present

## 2021-09-12 DIAGNOSIS — Z20822 Contact with and (suspected) exposure to covid-19: Secondary | ICD-10-CM | POA: Diagnosis not present

## 2021-09-13 DIAGNOSIS — E538 Deficiency of other specified B group vitamins: Secondary | ICD-10-CM | POA: Diagnosis not present

## 2021-09-13 DIAGNOSIS — M810 Age-related osteoporosis without current pathological fracture: Secondary | ICD-10-CM | POA: Diagnosis not present

## 2021-09-13 DIAGNOSIS — F0284 Dementia in other diseases classified elsewhere, unspecified severity, with anxiety: Secondary | ICD-10-CM | POA: Diagnosis not present

## 2021-09-13 DIAGNOSIS — I5030 Unspecified diastolic (congestive) heart failure: Secondary | ICD-10-CM | POA: Diagnosis not present

## 2021-09-13 DIAGNOSIS — I130 Hypertensive heart and chronic kidney disease with heart failure and stage 1 through stage 4 chronic kidney disease, or unspecified chronic kidney disease: Secondary | ICD-10-CM | POA: Diagnosis not present

## 2021-09-13 DIAGNOSIS — N189 Chronic kidney disease, unspecified: Secondary | ICD-10-CM | POA: Diagnosis not present

## 2021-09-16 ENCOUNTER — Ambulatory Visit: Payer: Medicare Other

## 2021-09-17 DIAGNOSIS — I130 Hypertensive heart and chronic kidney disease with heart failure and stage 1 through stage 4 chronic kidney disease, or unspecified chronic kidney disease: Secondary | ICD-10-CM | POA: Diagnosis not present

## 2021-09-17 DIAGNOSIS — N189 Chronic kidney disease, unspecified: Secondary | ICD-10-CM | POA: Diagnosis not present

## 2021-09-17 DIAGNOSIS — E538 Deficiency of other specified B group vitamins: Secondary | ICD-10-CM | POA: Diagnosis not present

## 2021-09-17 DIAGNOSIS — M810 Age-related osteoporosis without current pathological fracture: Secondary | ICD-10-CM | POA: Diagnosis not present

## 2021-09-17 DIAGNOSIS — I5030 Unspecified diastolic (congestive) heart failure: Secondary | ICD-10-CM | POA: Diagnosis not present

## 2021-09-17 DIAGNOSIS — F0284 Dementia in other diseases classified elsewhere, unspecified severity, with anxiety: Secondary | ICD-10-CM | POA: Diagnosis not present

## 2021-09-18 DIAGNOSIS — I130 Hypertensive heart and chronic kidney disease with heart failure and stage 1 through stage 4 chronic kidney disease, or unspecified chronic kidney disease: Secondary | ICD-10-CM | POA: Diagnosis not present

## 2021-09-18 DIAGNOSIS — E7800 Pure hypercholesterolemia, unspecified: Secondary | ICD-10-CM | POA: Diagnosis not present

## 2021-09-18 DIAGNOSIS — F0283 Dementia in other diseases classified elsewhere, unspecified severity, with mood disturbance: Secondary | ICD-10-CM | POA: Diagnosis not present

## 2021-09-18 DIAGNOSIS — K581 Irritable bowel syndrome with constipation: Secondary | ICD-10-CM | POA: Diagnosis not present

## 2021-09-18 DIAGNOSIS — N189 Chronic kidney disease, unspecified: Secondary | ICD-10-CM | POA: Diagnosis not present

## 2021-09-18 DIAGNOSIS — Z9181 History of falling: Secondary | ICD-10-CM | POA: Diagnosis not present

## 2021-09-18 DIAGNOSIS — Z950 Presence of cardiac pacemaker: Secondary | ICD-10-CM | POA: Diagnosis not present

## 2021-09-18 DIAGNOSIS — E538 Deficiency of other specified B group vitamins: Secondary | ICD-10-CM | POA: Diagnosis not present

## 2021-09-18 DIAGNOSIS — F0284 Dementia in other diseases classified elsewhere, unspecified severity, with anxiety: Secondary | ICD-10-CM | POA: Diagnosis not present

## 2021-09-18 DIAGNOSIS — E039 Hypothyroidism, unspecified: Secondary | ICD-10-CM | POA: Diagnosis not present

## 2021-09-18 DIAGNOSIS — M810 Age-related osteoporosis without current pathological fracture: Secondary | ICD-10-CM | POA: Diagnosis not present

## 2021-09-18 DIAGNOSIS — K219 Gastro-esophageal reflux disease without esophagitis: Secondary | ICD-10-CM | POA: Diagnosis not present

## 2021-09-18 DIAGNOSIS — F32A Depression, unspecified: Secondary | ICD-10-CM | POA: Diagnosis not present

## 2021-09-18 DIAGNOSIS — D649 Anemia, unspecified: Secondary | ICD-10-CM | POA: Diagnosis not present

## 2021-09-18 DIAGNOSIS — Z8616 Personal history of COVID-19: Secondary | ICD-10-CM | POA: Diagnosis not present

## 2021-09-18 DIAGNOSIS — I5030 Unspecified diastolic (congestive) heart failure: Secondary | ICD-10-CM | POA: Diagnosis not present

## 2021-09-19 ENCOUNTER — Ambulatory Visit (INDEPENDENT_AMBULATORY_CARE_PROVIDER_SITE_OTHER): Payer: Medicare Other | Admitting: Physician Assistant

## 2021-09-19 ENCOUNTER — Encounter: Payer: Self-pay | Admitting: Physician Assistant

## 2021-09-19 ENCOUNTER — Other Ambulatory Visit: Payer: Self-pay

## 2021-09-19 VITALS — BP 100/52 | HR 64 | Ht 61.0 in | Wt 150.2 lb

## 2021-09-19 DIAGNOSIS — I10 Essential (primary) hypertension: Secondary | ICD-10-CM | POA: Diagnosis not present

## 2021-09-19 DIAGNOSIS — I2510 Atherosclerotic heart disease of native coronary artery without angina pectoris: Secondary | ICD-10-CM | POA: Diagnosis not present

## 2021-09-19 DIAGNOSIS — I495 Sick sinus syndrome: Secondary | ICD-10-CM

## 2021-09-19 DIAGNOSIS — Z950 Presence of cardiac pacemaker: Secondary | ICD-10-CM | POA: Diagnosis not present

## 2021-09-19 LAB — CUP PACEART INCLINIC DEVICE CHECK
Date Time Interrogation Session: 20230223184204
Implantable Lead Implant Date: 20171009
Implantable Lead Implant Date: 20171009
Implantable Lead Location: 753859
Implantable Lead Location: 753860
Implantable Lead Model: 5076
Implantable Lead Model: 7741
Implantable Lead Serial Number: 785430
Implantable Pulse Generator Implant Date: 20171009
Lead Channel Impedance Value: 1796 Ohm
Lead Channel Impedance Value: 384 Ohm
Lead Channel Pacing Threshold Amplitude: 0.5 V
Lead Channel Pacing Threshold Amplitude: 1.3 V
Lead Channel Pacing Threshold Pulse Width: 1 ms
Lead Channel Pacing Threshold Pulse Width: 1 ms
Lead Channel Sensing Intrinsic Amplitude: 9.6 mV
Lead Channel Setting Pacing Amplitude: 2 V
Lead Channel Setting Pacing Amplitude: 2.6 V
Lead Channel Setting Pacing Pulse Width: 1 ms
Lead Channel Setting Sensing Sensitivity: 2.5 mV
Pulse Gen Serial Number: 766467

## 2021-09-19 NOTE — Patient Instructions (Addendum)
Medication Instructions:   Your physician recommends that you continue on your current medications as directed. Please refer to the Current Medication list given to you today.   *If you need a refill on your cardiac medications before your next appointment, please call your pharmacy*   Lab Work: [place]    If you have labs (blood work) drawn today and your tests are completely normal, you will receive your results only by: [place] (if you have MyChart) OR A paper copy in the mail If you have any lab test that is abnormal or we need to change your treatment, we will call you to review the results.   Testing/Procedures: NONE ORDERED  TODAY    Follow-Up: At [facility], you and your health needs are our priority.  As part of our continuing mission to provide you with exceptional heart care, we have created designated Provider Care Teams.  These Care Teams include your primary Cardiologist (physician) and Advanced Practice Providers (APPs -  Physician Assistants and Nurse Practitioners) who all work together to provide you with the care you need, when you need it.  We recommend signing up for the patient portal called "MyChart".  Sign up information is provided on this After Visit Summary.  MyChart is used to connect with patients for Virtual Visits (Telemedicine).  Patients are able to view lab/test results, encounter notes, upcoming appointments, etc.  Non-urgent messages can be sent to your provider as well.   To learn more about what you can do with MyChart, go to [email or web address].    Your next appointment:   I year   The format for your next appointment:   In Person  Provider:   You may see Dr. Lovena Le

## 2021-09-19 NOTE — Progress Notes (Signed)
Cardiology Office Note Date:  09/19/2021  Patient ID:  [name], [name] [birth year], MRN [id number] PCP:  [name], MD  Electrophysiologist: Dr. Lovena Le    Chief Complaint:  annual  History of Present Illness: [name] is a 87 y.o. female with history of HTN, HLD, Parkinson's, dysphagia and orthostatic hypotension, chronic CHF , HFpEF, SN dysfunction with PPM, CAD (cath below,m with distal LAD lesion, nonobstructive disease otherwise)  She comes in today to be seen for Dr. Lovena Le, last seen by him July 2022, more sedentary with onset of Parkinson's, some edema using lasix PRN.  No changes were made.  She saw Dr. Carles Collet (neurology) 08/22/20, worsening dysphagia, they discussed EOL decisions, both the pt and husband, planned for further discussions at her (their) next visit. Discussed orthostatic hypotension and recommendation or permissive hypertension, off lasix and at a reduced BB dose.  TODAY She is with her husband. They live at senior living, she is ALF, meds are managed by staff.  There is nursing care and weekly MD visits. She has not had any CP, palpitations or cardiac awareness. Dysphagia is very bothersome and speech therapy is underway She ambulated minimally, a few feet here/there, pretty unsteady. Get winded easily, though no SOB at rest No near syncope or syncope  Device information BSci implanted 05/05/2016   Past Medical History:  Diagnosis Date   Allergy    Anemia    Cancer of left breast ([place]) 1978   CHF (congestive heart failure) ([place])    Chronic thoracic back pain    "T8; fracture; 03/2015; no OR" (05/05/2016)   GERD (gastroesophageal reflux disease)    Heart murmur    History of hiatal hernia    Hyperlipidemia    Hyperplastic colon polyp    Hypertension    IBS (irritable bowel syndrome)    Multiple thyroid nodules    Osteoporosis    T8 compression fx 03/2015    Personal history of radiation therapy    Presence of permanent cardiac  pacemaker    Vitamin B12 deficiency     Past Surgical History:  Procedure Laterality Date   ANTERIOR CERVICAL DECOMP/DISCECTOMY FUSION  2008   C5-6   AUGMENTATION MAMMAPLASTY     BACK SURGERY     BREAST SURGERY     CARDIAC CATHETERIZATION  2001   EP IMPLANTABLE DEVICE N/A 05/05/2016   Procedure: Pacemaker Implant;  Surgeon: Evans Lance, MD;  Location: [facility];  Service: Cardiovascular;  Laterality: N/A;   EXCISIONAL HEMORRHOIDECTOMY  1984   With subsequent correction of surgery   INSERT / REPLACE / REMOVE PACEMAKER     INTRAVASCULAR PRESSURE WIRE/FFR STUDY N/A 10/07/2019   Procedure: INTRAVASCULAR PRESSURE WIRE/FFR STUDY;  Surgeon: Jettie Booze, MD;  Location: [facility];  Service: Cardiovascular;  Laterality: N/A;   KNEE ARTHROSCOPY Left    "meniscus tear"   LEFT HEART CATH AND CORONARY ANGIOGRAPHY N/A 10/07/2019   Procedure: LEFT HEART CATH AND CORONARY ANGIOGRAPHY;  Surgeon: Jettie Booze, MD;  Location: [facility];  Service: Cardiovascular;  Laterality: N/A;   MASTECTOMY Left 1978   PLACEMENT OF BREAST IMPLANTS Left 1981   REDUCTION MAMMAPLASTY Right    SHOULDER ARTHROSCOPY W/ ROTATOR CUFF REPAIR Left 02/2006   "tear"   TUBAL LIGATION      Current Outpatient Medications  Medication Sig Dispense Refill   acetaminophen (TYLENOL) 500 MG tablet Take 500 mg by mouth 2 (two) times daily.  busPIRone (BUSPAR) 10 MG tablet Take 1 tablet (10 mg total) by mouth 3 (three) times daily as needed. 90 tablet 1   cholecalciferol (VITAMIN D) 1000 UNITS tablet Take 2,000 Units by mouth daily.      Cyanocobalamin 1000 MCG TBCR Take 1,000 mcg by mouth daily.      denosumab (PROLIA) 60 MG/ML SOSY injection Inject 60 mg into the skin every 6 (six) months.     diclofenac Sodium (VOLTAREN) 1 % GEL Apply 2 g topically 4 (four) times daily.     escitalopram (LEXAPRO) 10 MG tablet Take 1 tablet (10 mg total) by mouth daily. 90 tablet 1   ezetimibe (ZETIA)  10 MG tablet TAKE ONE TABLET BY MOUTH EVERY MORNING 90 tablet 2   ferrous sulfate 325 (65 FE) MG tablet Take 325 mg by mouth 3 (three) times a week.     metoprolol succinate (TOPROL XL) 25 MG 24 hr tablet Take 0.5 tablets (12.5 mg total) by mouth daily. 45 tablet 90   Misc. Devices [facility]) MISC Power Wheelchair 1 each 0   omeprazole (PRILOSEC) 40 MG capsule TAKE 1 CAPSULE BY MOUTH TWICE A DAY FOR GERD. 28 capsule 0   Polyethyl Glycol-Propyl Glycol (LUBRICANT EYE DROPS) 0.4-0.3 % SOLN Place 1 drop into both eyes 3 (three) times daily as needed (dry/irritated eyes.).     polyethylene glycol (MIRALAX / GLYCOLAX) packet Take 17 g by mouth daily as needed (constipation).     torsemide (DEMADEX) 5 MG tablet Take 5 mg by mouth daily.     No current facility-administered medications for this visit.    Allergies:   Statins, Cymbalta [duloxetine hcl], Fosamax [alendronate sodium], Amitriptyline, Lyrica [pregabalin], Neurontin [gabapentin], and Zanaflex [tizanidine]   Social History:  The patient  reports that she has never smoked. She has never used smokeless tobacco. She reports that she does not drink alcohol and does not use drugs.   Family History:  The patient's family history includes Atrial fibrillation in her sister; Breast cancer in her maternal aunt; Diabetes in her brother; Stroke in her sister.  ROS:  Please see the history of present illness.    All other systems are reviewed and otherwise negative.   PHYSICAL EXAM:  VS:  BP (!) 100/52    Pulse 64    Ht 5\' 1"  (1.549 m)    Wt 150 lb 3.2 oz (68.1 kg)    SpO2 96%    BMI 28.38 kg/m  BMI: Body mass index is 28.38 kg/m. Well nourished, well developed, in no acute distress HEENT: normocephalic, atraumatic Neck: no JVD, carotid bruits or masses Cardiac:  RRR; no significant murmurs, no rubs, or gallops Lungs:  CTA b/l, no wheezing, rhonchi or rales Abd: soft, nontender MS: no deformity or atrophy Ext: no edema Skin: warm and dry, no  rash Neuro:  No gross deficits appreciated, weak voice Psych: euthymic mood, full affect  PPM site is stable, no tethering or discomfort   EKG:  not done today  Device interrogation done today and reviewed by myself:  Battery and lead measurements are good RV threshold is c/w prior, outputs adjusted for 2:1 No arrhythmias AP 64% VP <1%   10/07/2019 Mid RCA-1 lesion is 50% stenosed. Not significant by DFR, 1.0. Mid RCA-2 lesion is 25% stenosed. Dist LAD lesion is 100% stenosed. Prox Cx to Mid Cx lesion is 10% stenosed. Mid LAD lesion is 10% stenosed. The left ventricular systolic function is normal. LV end diastolic pressure is normal. The  left ventricular ejection fraction is 55-65% by visual estimate. There is no aortic valve stenosis.   Continue medical therapy for angina.  Apical LAD occlusion is too distal for PCI attempt.  Consider Ranexa given BP limiting other meds.    11/21/2016: TTE Study Conclusions  - Left ventricle: The cavity size was normal. Wall thickness was    increased in a pattern of mild LVH. Systolic function was normal.    The estimated ejection fraction was in the range of 60% to 65%.    Wall motion was normal; there were no regional wall motion    abnormalities. Features are consistent with a pseudonormal left    ventricular filling pattern, with concomitant abnormal relaxation    and increased filling pressure (grade 2 diastolic dysfunction).    Doppler parameters are consistent with high ventricular filling    pressure.  - Aortic valve: Valve mobility was restricted. There was very mild    stenosis. There was trivial regurgitation.  - Mitral valve: Calcified annulus. There was mild regurgitation.  - Left atrium: The atrium was mildly dilated.  - Pulmonary arteries: Systolic pressure was mildly increased.  - Pericardium, extracardiac: A trivial pericardial effusion was    identified.   Impressions:  - Normal LV systolic function; mild LVH;  moderate diastolic    dysfunction with elevated LV filling pressure; calcified aortic    valve with very mild AS and trace AI; mild MR; mild LAE; mild TR    with mildly elevated pulmonary pressure.    Recent Labs: 10/05/2020: Magnesium 2.3 01/15/2021: ALT 4; BUN 9; Creatinine, Ser 0.87; Hemoglobin 12.0; Platelets 308.0; Potassium 4.2; Sodium 134  No results found for requested labs within last 8760 hours.   CrCl cannot be calculated (Patient's most recent lab result is older than the maximum 21 days allowed.).   Wt Readings from Last 3 Encounters:  09/19/21 150 lb 3.2 oz (68.1 kg)  08/22/21 153 lb (69.4 kg)  07/24/21 153 lb (69.4 kg)     Other studies reviewed: Additional studies/records reviewed today include: summarized above  ASSESSMENT AND PLAN:  PPM Intact function Programming changes as discussed  HTN Relative hypotension No changes today  HLD Not addressed today  CAD No anginal symptoms  Sounds like her Parkinson's is advancing, they will revisit code status and decisions further with he attending MD at the SNF/ALF  Disposition: F/u with remotes as usual, in clinic in a year, sooner if needed  Current medicines are reviewed at length with the patient today.  The patient did not have any concerns regarding medicines.  Venetia Night, PA-C 09/19/2021 5:41 PM     [address] [place] [address] [phone] (office)  [phone] (fax)

## 2021-09-23 DIAGNOSIS — E538 Deficiency of other specified B group vitamins: Secondary | ICD-10-CM | POA: Diagnosis not present

## 2021-09-23 DIAGNOSIS — N189 Chronic kidney disease, unspecified: Secondary | ICD-10-CM | POA: Diagnosis not present

## 2021-09-23 DIAGNOSIS — M810 Age-related osteoporosis without current pathological fracture: Secondary | ICD-10-CM | POA: Diagnosis not present

## 2021-09-23 DIAGNOSIS — F0284 Dementia in other diseases classified elsewhere, unspecified severity, with anxiety: Secondary | ICD-10-CM | POA: Diagnosis not present

## 2021-09-23 DIAGNOSIS — I130 Hypertensive heart and chronic kidney disease with heart failure and stage 1 through stage 4 chronic kidney disease, or unspecified chronic kidney disease: Secondary | ICD-10-CM | POA: Diagnosis not present

## 2021-09-23 DIAGNOSIS — I5030 Unspecified diastolic (congestive) heart failure: Secondary | ICD-10-CM | POA: Diagnosis not present

## 2021-09-24 ENCOUNTER — Ambulatory Visit: Payer: Medicare Other | Admitting: Family Medicine

## 2021-09-25 DIAGNOSIS — E538 Deficiency of other specified B group vitamins: Secondary | ICD-10-CM | POA: Diagnosis not present

## 2021-09-25 DIAGNOSIS — I5030 Unspecified diastolic (congestive) heart failure: Secondary | ICD-10-CM | POA: Diagnosis not present

## 2021-09-25 DIAGNOSIS — F0284 Dementia in other diseases classified elsewhere, unspecified severity, with anxiety: Secondary | ICD-10-CM | POA: Diagnosis not present

## 2021-09-25 DIAGNOSIS — N189 Chronic kidney disease, unspecified: Secondary | ICD-10-CM | POA: Diagnosis not present

## 2021-09-25 DIAGNOSIS — M810 Age-related osteoporosis without current pathological fracture: Secondary | ICD-10-CM | POA: Diagnosis not present

## 2021-09-25 DIAGNOSIS — I130 Hypertensive heart and chronic kidney disease with heart failure and stage 1 through stage 4 chronic kidney disease, or unspecified chronic kidney disease: Secondary | ICD-10-CM | POA: Diagnosis not present

## 2021-10-02 DIAGNOSIS — M810 Age-related osteoporosis without current pathological fracture: Secondary | ICD-10-CM | POA: Diagnosis not present

## 2021-10-02 DIAGNOSIS — I5030 Unspecified diastolic (congestive) heart failure: Secondary | ICD-10-CM | POA: Diagnosis not present

## 2021-10-02 DIAGNOSIS — E538 Deficiency of other specified B group vitamins: Secondary | ICD-10-CM | POA: Diagnosis not present

## 2021-10-02 DIAGNOSIS — N189 Chronic kidney disease, unspecified: Secondary | ICD-10-CM | POA: Diagnosis not present

## 2021-10-02 DIAGNOSIS — F0284 Dementia in other diseases classified elsewhere, unspecified severity, with anxiety: Secondary | ICD-10-CM | POA: Diagnosis not present

## 2021-10-02 DIAGNOSIS — I130 Hypertensive heart and chronic kidney disease with heart failure and stage 1 through stage 4 chronic kidney disease, or unspecified chronic kidney disease: Secondary | ICD-10-CM | POA: Diagnosis not present

## 2021-10-09 DIAGNOSIS — M810 Age-related osteoporosis without current pathological fracture: Secondary | ICD-10-CM | POA: Diagnosis not present

## 2021-10-09 DIAGNOSIS — N189 Chronic kidney disease, unspecified: Secondary | ICD-10-CM | POA: Diagnosis not present

## 2021-10-09 DIAGNOSIS — E538 Deficiency of other specified B group vitamins: Secondary | ICD-10-CM | POA: Diagnosis not present

## 2021-10-09 DIAGNOSIS — I5030 Unspecified diastolic (congestive) heart failure: Secondary | ICD-10-CM | POA: Diagnosis not present

## 2021-10-09 DIAGNOSIS — F0284 Dementia in other diseases classified elsewhere, unspecified severity, with anxiety: Secondary | ICD-10-CM | POA: Diagnosis not present

## 2021-10-09 DIAGNOSIS — I130 Hypertensive heart and chronic kidney disease with heart failure and stage 1 through stage 4 chronic kidney disease, or unspecified chronic kidney disease: Secondary | ICD-10-CM | POA: Diagnosis not present

## 2021-10-14 DIAGNOSIS — N189 Chronic kidney disease, unspecified: Secondary | ICD-10-CM | POA: Diagnosis not present

## 2021-10-14 DIAGNOSIS — F0284 Dementia in other diseases classified elsewhere, unspecified severity, with anxiety: Secondary | ICD-10-CM | POA: Diagnosis not present

## 2021-10-14 DIAGNOSIS — E538 Deficiency of other specified B group vitamins: Secondary | ICD-10-CM | POA: Diagnosis not present

## 2021-10-14 DIAGNOSIS — I130 Hypertensive heart and chronic kidney disease with heart failure and stage 1 through stage 4 chronic kidney disease, or unspecified chronic kidney disease: Secondary | ICD-10-CM | POA: Diagnosis not present

## 2021-10-14 DIAGNOSIS — I5030 Unspecified diastolic (congestive) heart failure: Secondary | ICD-10-CM | POA: Diagnosis not present

## 2021-10-14 DIAGNOSIS — M810 Age-related osteoporosis without current pathological fracture: Secondary | ICD-10-CM | POA: Diagnosis not present

## 2021-10-15 NOTE — Progress Notes (Signed)
?[name] D.O. ?[facility] ?[place] ?Phone: [phone] ?Subjective:   ?I, [name], am serving as a Education administrator for Dr. Hulan Saas. ?This visit occurred during the SARS-CoV-2 public health emergency.  Safety protocols were in place, including screening questions prior to the visit, additional usage of staff PPE, and extensive cleaning of exam room while observing appropriate contact time as indicated for disinfecting solutions.  ? ?I'm seeing this patient by the request  of:  [name], MD ? ?CC: Bilateral knee pain and leg weakness ? ?[id number]  ?07/24/2021 ?Patient continues to have some deconditioning.  Do feel that formal physical therapy will be beneficial.  Patient is now in a assisted living facility.  Patient is accompanied with daughter who does agree with the plan at the moment.  Discussed with patient about other possibilities such as aquatic therapy.  I believe the patient though has been under significant more stress recently secondary to her husband.  I think this is causing more confusion and her not focusing on herself as much again.  We will make no significant changes in medication.  Total time with patient today 31 minutes ? ?Update 10/16/2021 ?[name] is a 87 y.o. female coming in with complaint of deconditioning and B knee pain.  Patient had some deconditioning and had been transferred to an assisted living facility.  Reviewing patient's charts she has been working with rehabilitation with her recently did see neurology as well for dysphagia and orthostatic hypotension.  Patient states that her knees are the same as last visit. R>L. Using topical analgesics. Reports parkinson's is getting worse with more shaking. Patient is doing PT but is having hard time doing any exercises.  ?Patient is even having difficulty with just transferring at this moment.  Awaiting a motorized wheelchair but has not received it yet. ? ?  ? ?Past  Medical History:  ?Diagnosis Date  ? Allergy   ? Anemia   ? Cancer of left breast ([place]) 1978  ? CHF (congestive heart failure) ([place])   ? Chronic thoracic back pain   ? "T8; fracture; 03/2015; no OR" (05/05/2016)  ? GERD (gastroesophageal reflux disease)   ? Heart murmur   ? History of hiatal hernia   ? Hyperlipidemia   ? Hyperplastic colon polyp   ? Hypertension   ? IBS (irritable bowel syndrome)   ? Multiple thyroid nodules   ? Osteoporosis   ? T8 compression fx 03/2015   ? Personal history of radiation therapy   ? Presence of permanent cardiac pacemaker   ? Vitamin B12 deficiency   ? ?Past Surgical History:  ?Procedure Laterality Date  ? ANTERIOR CERVICAL DECOMP/DISCECTOMY FUSION  2008  ? C5-6  ? AUGMENTATION MAMMAPLASTY    ? BACK SURGERY    ? BREAST SURGERY    ? CARDIAC CATHETERIZATION  2001  ? EP IMPLANTABLE DEVICE N/A 05/05/2016  ? Procedure: Pacemaker Implant;  Surgeon: Evans Lance, MD;  Location: [facility];  Service: Cardiovascular;  Laterality: N/A;  ? EXCISIONAL HEMORRHOIDECTOMY  1984  ? With subsequent correction of surgery  ? INSERT / REPLACE / REMOVE PACEMAKER    ? INTRAVASCULAR PRESSURE WIRE/FFR STUDY N/A 10/07/2019  ? Procedure: INTRAVASCULAR PRESSURE WIRE/FFR STUDY;  Surgeon: Jettie Booze, MD;  Location: [facility];  Service: Cardiovascular;  Laterality: N/A;  ? KNEE ARTHROSCOPY Left   ? "meniscus tear"  ? LEFT HEART CATH AND CORONARY ANGIOGRAPHY N/A 10/07/2019  ?  Procedure: LEFT HEART CATH AND CORONARY ANGIOGRAPHY;  Surgeon: Jettie Booze, MD;  Location: [facility];  Service: Cardiovascular;  Laterality: N/A;  ? MASTECTOMY Left 1978  ? PLACEMENT OF BREAST IMPLANTS Left 1981  ? REDUCTION MAMMAPLASTY Right   ? SHOULDER ARTHROSCOPY W/ ROTATOR CUFF REPAIR Left 02/2006  ? "tear"  ? TUBAL LIGATION    ? ?Social History  ? ?Socioeconomic History  ? Marital status: Married  ?  Spouse name: Not on file  ? Number of children: 2  ? Years of education: Not on file  ? Highest  education level: Not on file  ?Occupational History  ? Occupation: retired  ?Tobacco Use  ? Smoking status: Never  ? Smokeless tobacco: Never  ?Vaping Use  ? Vaping Use: Never used  ?Substance and Sexual Activity  ? Alcohol use: No  ?  Alcohol/week: 0.0 standard drinks  ? Drug use: No  ? Sexual activity: Not Currently  ?Other Topics Concern  ? Not on file  ?Social History Narrative  ? Housewife, Lives with spouse, 2 children  ? ?Social Determinants of Health  ? ?Financial Resource Strain: Low Risk   ? Difficulty of Paying Living Expenses: Not hard at all  ?Food Insecurity: No Food Insecurity  ? Worried About Charity fundraiser in the Last Year: Never true  ? Ran Out of Food in the Last Year: Never true  ?Transportation Needs: No Transportation Needs  ? Lack of Transportation (Medical): No  ? Lack of Transportation (Non-Medical): No  ?Physical Activity: Inactive  ? Days of Exercise per Week: 0 days  ? Minutes of Exercise per Session: 0 min  ?Stress: No Stress Concern Present  ? Feeling of Stress : Not at all  ?Social Connections: Socially Integrated  ? Frequency of Communication with Friends and Family: More than three times a week  ? Frequency of Social Gatherings with Friends and Family: More than three times a week  ? Attends Religious Services: 1 to 4 times per year  ? Active Member of Clubs or Organizations: No  ? Attends Archivist Meetings: 1 to 4 times per year  ? Marital Status: Married  ? ?Allergies  ?Allergen Reactions  ? Statins   ?  Lipitor caused hospitalization - - depletion of electrolytes, fever, nausea, loss of appetite, couldn't get out of bed  ? Cymbalta [Duloxetine Hcl] Other (See Comments)  ?  Dulled her too much, difficulty urinating, change in vision  ? Fosamax [Alendronate Sodium] Other (See Comments)  ?  Caused chronic issues swallowing  ? Amitriptyline   ?  hallucinations  ? Lyrica [Pregabalin]   ?  Unknown  ? Neurontin [Gabapentin] Other (See Comments)  ?  Dizziness and sedation  (patient is tolerating in lower dose)  ? Zanaflex [Tizanidine] Other (See Comments)  ?  Could not sleep  ? ?Family History  ?Problem Relation Age of Onset  ? Stroke Sister   ? Diabetes Brother   ? Breast cancer Maternal Aunt   ? Atrial fibrillation Sister   ? Colon cancer Neg Hx   ? Stomach cancer Neg Hx   ? ? ?Current Outpatient Medications (Endocrine & Metabolic):  ?  denosumab (PROLIA) 60 MG/ML SOSY injection, Inject 60 mg into the skin every 6 (six) months. ? ?Current Outpatient Medications (Cardiovascular):  ?  ezetimibe (ZETIA) 10 MG tablet, TAKE ONE TABLET BY MOUTH EVERY MORNING ?  metoprolol succinate (TOPROL XL) 25 MG 24 hr tablet, Take 0.5 tablets (12.5 mg  total) by mouth daily. ?  torsemide (DEMADEX) 5 MG tablet, Take 5 mg by mouth daily. ? ? ?Current Outpatient Medications (Analgesics):  ?  acetaminophen (TYLENOL) 500 MG tablet, Take 500 mg by mouth 2 (two) times daily. ? ?Current Outpatient Medications (Hematological):  ?  Cyanocobalamin 1000 MCG TBCR, Take 1,000 mcg by mouth daily.  ?  ferrous sulfate 325 (65 FE) MG tablet, Take 325 mg by mouth 3 (three) times a week. ? ?Current Outpatient Medications (Other):  ?  busPIRone (BUSPAR) 10 MG tablet, Take 1 tablet (10 mg total) by mouth 3 (three) times daily as needed. ?  cholecalciferol (VITAMIN D) 1000 UNITS tablet, Take 2,000 Units by mouth daily.  ?  diclofenac Sodium (VOLTAREN) 1 % GEL, Apply 2 g topically 4 (four) times daily. ?  escitalopram (LEXAPRO) 10 MG tablet, Take 1 tablet (10 mg total) by mouth daily. ?  Misc. Devices (WHEELCHAIR) MISC, Power Wheelchair ?  omeprazole (PRILOSEC) 40 MG capsule, TAKE 1 CAPSULE BY MOUTH TWICE A DAY FOR GERD. ?  Polyethyl Glycol-Propyl Glycol (LUBRICANT EYE DROPS) 0.4-0.3 % SOLN, Place 1 drop into both eyes 3 (three) times daily as needed (dry/irritated eyes.). ?  polyethylene glycol (MIRALAX / GLYCOLAX) packet, Take 17 g by mouth daily as needed (constipation). ? ? ?Reviewed prior external information including  notes and imaging from  ?primary care provider ?As well as notes that were available from care everywhere and other healthcare systems. ? ?Past medical history, social, surgical and family history all rev

## 2021-10-16 ENCOUNTER — Other Ambulatory Visit: Payer: Self-pay

## 2021-10-16 ENCOUNTER — Ambulatory Visit (INDEPENDENT_AMBULATORY_CARE_PROVIDER_SITE_OTHER): Payer: Medicare Other | Admitting: Family Medicine

## 2021-10-16 VITALS — BP 112/60 | HR 60 | Ht 61.0 in | Wt 157.0 lb

## 2021-10-16 DIAGNOSIS — I2510 Atherosclerotic heart disease of native coronary artery without angina pectoris: Secondary | ICD-10-CM | POA: Diagnosis not present

## 2021-10-16 DIAGNOSIS — R2689 Other abnormalities of gait and mobility: Secondary | ICD-10-CM | POA: Diagnosis not present

## 2021-10-16 DIAGNOSIS — R29898 Other symptoms and signs involving the musculoskeletal system: Secondary | ICD-10-CM | POA: Diagnosis not present

## 2021-10-16 NOTE — Patient Instructions (Signed)
Can consider knee injections at follow up ?Consider motorized wheel chair ?See me again in 6 weeks ?

## 2021-10-16 NOTE — Assessment & Plan Note (Addendum)
Continues to have significant deconditioning and poor balance.  Does have some arthritic changes of the knees but I do not think that this is contributing as much to the instability at this moment.  We discussed with patient about the possibility of injections but patient declined.  Patient does not feel like she was getting significant improvement with physical therapy in her new assisted living facility.  Discussed which activities to do which wants to avoid.  Increase activity slowly.  Follow-up again in 3 months otherwise.  Total time with patient discussing different treatment options as well as further evaluation patient greater than 31 minutes. ?

## 2021-10-18 DIAGNOSIS — Z8616 Personal history of COVID-19: Secondary | ICD-10-CM | POA: Diagnosis not present

## 2021-10-18 DIAGNOSIS — R32 Unspecified urinary incontinence: Secondary | ICD-10-CM | POA: Diagnosis not present

## 2021-10-18 DIAGNOSIS — Z9181 History of falling: Secondary | ICD-10-CM | POA: Diagnosis not present

## 2021-10-18 DIAGNOSIS — Z950 Presence of cardiac pacemaker: Secondary | ICD-10-CM | POA: Diagnosis not present

## 2021-10-18 DIAGNOSIS — E7800 Pure hypercholesterolemia, unspecified: Secondary | ICD-10-CM | POA: Diagnosis not present

## 2021-10-18 DIAGNOSIS — E538 Deficiency of other specified B group vitamins: Secondary | ICD-10-CM | POA: Diagnosis not present

## 2021-10-18 DIAGNOSIS — E039 Hypothyroidism, unspecified: Secondary | ICD-10-CM | POA: Diagnosis not present

## 2021-10-18 DIAGNOSIS — N189 Chronic kidney disease, unspecified: Secondary | ICD-10-CM | POA: Diagnosis not present

## 2021-10-18 DIAGNOSIS — K219 Gastro-esophageal reflux disease without esophagitis: Secondary | ICD-10-CM | POA: Diagnosis not present

## 2021-10-18 DIAGNOSIS — F0284 Dementia in other diseases classified elsewhere, unspecified severity, with anxiety: Secondary | ICD-10-CM | POA: Diagnosis not present

## 2021-10-18 DIAGNOSIS — F0283 Dementia in other diseases classified elsewhere, unspecified severity, with mood disturbance: Secondary | ICD-10-CM | POA: Diagnosis not present

## 2021-10-18 DIAGNOSIS — M810 Age-related osteoporosis without current pathological fracture: Secondary | ICD-10-CM | POA: Diagnosis not present

## 2021-10-18 DIAGNOSIS — I5030 Unspecified diastolic (congestive) heart failure: Secondary | ICD-10-CM | POA: Diagnosis not present

## 2021-10-18 DIAGNOSIS — I130 Hypertensive heart and chronic kidney disease with heart failure and stage 1 through stage 4 chronic kidney disease, or unspecified chronic kidney disease: Secondary | ICD-10-CM | POA: Diagnosis not present

## 2021-10-18 DIAGNOSIS — K581 Irritable bowel syndrome with constipation: Secondary | ICD-10-CM | POA: Diagnosis not present

## 2021-10-18 DIAGNOSIS — F32A Depression, unspecified: Secondary | ICD-10-CM | POA: Diagnosis not present

## 2021-10-18 DIAGNOSIS — D649 Anemia, unspecified: Secondary | ICD-10-CM | POA: Diagnosis not present

## 2021-10-23 DIAGNOSIS — N189 Chronic kidney disease, unspecified: Secondary | ICD-10-CM | POA: Diagnosis not present

## 2021-10-23 DIAGNOSIS — E538 Deficiency of other specified B group vitamins: Secondary | ICD-10-CM | POA: Diagnosis not present

## 2021-10-23 DIAGNOSIS — I130 Hypertensive heart and chronic kidney disease with heart failure and stage 1 through stage 4 chronic kidney disease, or unspecified chronic kidney disease: Secondary | ICD-10-CM | POA: Diagnosis not present

## 2021-10-23 DIAGNOSIS — I5030 Unspecified diastolic (congestive) heart failure: Secondary | ICD-10-CM | POA: Diagnosis not present

## 2021-10-23 DIAGNOSIS — M810 Age-related osteoporosis without current pathological fracture: Secondary | ICD-10-CM | POA: Diagnosis not present

## 2021-10-23 DIAGNOSIS — F0284 Dementia in other diseases classified elsewhere, unspecified severity, with anxiety: Secondary | ICD-10-CM | POA: Diagnosis not present

## 2021-10-28 ENCOUNTER — Telehealth: Payer: Self-pay | Admitting: Neurology

## 2021-10-28 NOTE — Telephone Encounter (Signed)
Patient left message with access nurse stating she received her electric wheelchair.  She stated that she would like to speak with [facility]. ?

## 2021-10-28 NOTE — Telephone Encounter (Signed)
Called daughter and she just wanted to let me know how happy her mom is now having her electric wheelchair  ?

## 2021-10-29 ENCOUNTER — Ambulatory Visit (INDEPENDENT_AMBULATORY_CARE_PROVIDER_SITE_OTHER): Payer: Medicare Other

## 2021-10-29 DIAGNOSIS — I495 Sick sinus syndrome: Secondary | ICD-10-CM

## 2021-10-30 DIAGNOSIS — I5030 Unspecified diastolic (congestive) heart failure: Secondary | ICD-10-CM | POA: Diagnosis not present

## 2021-10-30 DIAGNOSIS — E538 Deficiency of other specified B group vitamins: Secondary | ICD-10-CM | POA: Diagnosis not present

## 2021-10-30 DIAGNOSIS — M810 Age-related osteoporosis without current pathological fracture: Secondary | ICD-10-CM | POA: Diagnosis not present

## 2021-10-30 DIAGNOSIS — I130 Hypertensive heart and chronic kidney disease with heart failure and stage 1 through stage 4 chronic kidney disease, or unspecified chronic kidney disease: Secondary | ICD-10-CM | POA: Diagnosis not present

## 2021-10-30 DIAGNOSIS — F0284 Dementia in other diseases classified elsewhere, unspecified severity, with anxiety: Secondary | ICD-10-CM | POA: Diagnosis not present

## 2021-10-30 DIAGNOSIS — N189 Chronic kidney disease, unspecified: Secondary | ICD-10-CM | POA: Diagnosis not present

## 2021-10-30 LAB — CUP PACEART REMOTE DEVICE CHECK
Battery Remaining Longevity: 102 mo
Battery Remaining Percentage: 100 %
Brady Statistic RA Percent Paced: 77 %
Brady Statistic RV Percent Paced: 0 %
Date Time Interrogation Session: 20230404030100
Implantable Lead Implant Date: 20171009
Implantable Lead Implant Date: 20171009
Implantable Lead Location: 753859
Implantable Lead Location: 753860
Implantable Lead Model: 5076
Implantable Lead Model: 7741
Implantable Lead Serial Number: 785430
Implantable Pulse Generator Implant Date: 20171009
Lead Channel Impedance Value: 1821 Ohm
Lead Channel Impedance Value: 345 Ohm
Lead Channel Setting Pacing Amplitude: 2 V
Lead Channel Setting Pacing Amplitude: 2.6 V
Lead Channel Setting Pacing Pulse Width: 1 ms
Lead Channel Setting Sensing Sensitivity: 2.5 mV
Pulse Gen Serial Number: 766467

## 2021-10-31 DIAGNOSIS — E785 Hyperlipidemia, unspecified: Secondary | ICD-10-CM | POA: Diagnosis not present

## 2021-10-31 DIAGNOSIS — F0390 Unspecified dementia without behavioral disturbance: Secondary | ICD-10-CM | POA: Diagnosis not present

## 2021-10-31 DIAGNOSIS — D509 Iron deficiency anemia, unspecified: Secondary | ICD-10-CM | POA: Diagnosis not present

## 2021-10-31 DIAGNOSIS — R001 Bradycardia, unspecified: Secondary | ICD-10-CM | POA: Diagnosis not present

## 2021-10-31 DIAGNOSIS — R600 Localized edema: Secondary | ICD-10-CM | POA: Diagnosis not present

## 2021-10-31 DIAGNOSIS — Z0189 Encounter for other specified special examinations: Secondary | ICD-10-CM | POA: Diagnosis not present

## 2021-10-31 DIAGNOSIS — I509 Heart failure, unspecified: Secondary | ICD-10-CM | POA: Diagnosis not present

## 2021-11-06 DIAGNOSIS — N189 Chronic kidney disease, unspecified: Secondary | ICD-10-CM | POA: Diagnosis not present

## 2021-11-06 DIAGNOSIS — I130 Hypertensive heart and chronic kidney disease with heart failure and stage 1 through stage 4 chronic kidney disease, or unspecified chronic kidney disease: Secondary | ICD-10-CM | POA: Diagnosis not present

## 2021-11-06 DIAGNOSIS — I5030 Unspecified diastolic (congestive) heart failure: Secondary | ICD-10-CM | POA: Diagnosis not present

## 2021-11-06 DIAGNOSIS — E538 Deficiency of other specified B group vitamins: Secondary | ICD-10-CM | POA: Diagnosis not present

## 2021-11-06 DIAGNOSIS — M810 Age-related osteoporosis without current pathological fracture: Secondary | ICD-10-CM | POA: Diagnosis not present

## 2021-11-06 DIAGNOSIS — F0284 Dementia in other diseases classified elsewhere, unspecified severity, with anxiety: Secondary | ICD-10-CM | POA: Diagnosis not present

## 2021-11-13 DIAGNOSIS — E538 Deficiency of other specified B group vitamins: Secondary | ICD-10-CM | POA: Diagnosis not present

## 2021-11-13 DIAGNOSIS — F0284 Dementia in other diseases classified elsewhere, unspecified severity, with anxiety: Secondary | ICD-10-CM | POA: Diagnosis not present

## 2021-11-13 DIAGNOSIS — I5030 Unspecified diastolic (congestive) heart failure: Secondary | ICD-10-CM | POA: Diagnosis not present

## 2021-11-13 DIAGNOSIS — N189 Chronic kidney disease, unspecified: Secondary | ICD-10-CM | POA: Diagnosis not present

## 2021-11-13 DIAGNOSIS — M810 Age-related osteoporosis without current pathological fracture: Secondary | ICD-10-CM | POA: Diagnosis not present

## 2021-11-13 DIAGNOSIS — I130 Hypertensive heart and chronic kidney disease with heart failure and stage 1 through stage 4 chronic kidney disease, or unspecified chronic kidney disease: Secondary | ICD-10-CM | POA: Diagnosis not present

## 2021-11-14 DIAGNOSIS — D508 Other iron deficiency anemias: Secondary | ICD-10-CM | POA: Diagnosis not present

## 2021-11-14 DIAGNOSIS — I10 Essential (primary) hypertension: Secondary | ICD-10-CM | POA: Diagnosis not present

## 2021-11-14 DIAGNOSIS — Z20822 Contact with and (suspected) exposure to covid-19: Secondary | ICD-10-CM | POA: Diagnosis not present

## 2021-11-14 NOTE — Progress Notes (Signed)
Remote pacemaker transmission.   

## 2021-11-19 ENCOUNTER — Encounter: Payer: Medicare Other | Admitting: Internal Medicine

## 2021-11-28 DIAGNOSIS — Z20822 Contact with and (suspected) exposure to covid-19: Secondary | ICD-10-CM | POA: Diagnosis not present

## 2021-12-17 NOTE — Telephone Encounter (Addendum)
Dr Quay Burow, Juluis Rainier pt is > 90 days past due for Prolia injection.   If you would like for pt to continue with Prolia therapy, please have clinical staff reach out to pt for scheduling and to explain to importance of receiving Prolia injections every 6 months as abrupt cessation of Prolia raises risk of osteoporotic fracture.    "Discontinuation of Dmab is associated with a 3- to 5-fold higher risk for vertebral, major osteoporotic, and hip fractures [38,39]."   [email or web address]    Per visit note 04/30/21:  Problem: Osteoporosis  Editor: [name], MD (Physician)              Chronic dexa up to date On prolia Q 6 months - continue Had injection recently Continue vitamin d

## 2021-12-23 NOTE — Telephone Encounter (Signed)
Pt archived in [email or web address].  Please advise if patient and/or provider wish to proceed with Prolia therpay.  

## 2022-01-10 DIAGNOSIS — M810 Age-related osteoporosis without current pathological fracture: Secondary | ICD-10-CM | POA: Diagnosis not present

## 2022-01-10 DIAGNOSIS — F39 Unspecified mood [affective] disorder: Secondary | ICD-10-CM | POA: Diagnosis not present

## 2022-01-10 DIAGNOSIS — I5032 Chronic diastolic (congestive) heart failure: Secondary | ICD-10-CM | POA: Diagnosis not present

## 2022-01-10 DIAGNOSIS — R262 Difficulty in walking, not elsewhere classified: Secondary | ICD-10-CM | POA: Diagnosis not present

## 2022-01-10 DIAGNOSIS — G8929 Other chronic pain: Secondary | ICD-10-CM | POA: Diagnosis not present

## 2022-01-10 DIAGNOSIS — Z950 Presence of cardiac pacemaker: Secondary | ICD-10-CM | POA: Diagnosis not present

## 2022-01-10 DIAGNOSIS — G20 Parkinson's disease: Secondary | ICD-10-CM | POA: Diagnosis not present

## 2022-01-10 DIAGNOSIS — Z859 Personal history of malignant neoplasm, unspecified: Secondary | ICD-10-CM | POA: Diagnosis not present

## 2022-01-15 ENCOUNTER — Ambulatory Visit (INDEPENDENT_AMBULATORY_CARE_PROVIDER_SITE_OTHER): Payer: Medicare Other | Admitting: Family Medicine

## 2022-01-15 DIAGNOSIS — M170 Bilateral primary osteoarthritis of knee: Secondary | ICD-10-CM

## 2022-01-15 DIAGNOSIS — I2510 Atherosclerotic heart disease of native coronary artery without angina pectoris: Secondary | ICD-10-CM | POA: Diagnosis not present

## 2022-01-15 NOTE — Progress Notes (Signed)
[name] [address] [place] Phone: [phone] Subjective:   IVilma [name], am serving as a scribe for Dr. Hulan Saas.  I'm seeing this patient by the request  of:  [name], MD  CC: knee pain   [id number]  [name] is a 87 y.o. female coming in with complaint of knee pain. Injection in knee today.  Patient has had significant difficulty with walking.  Patient does have a motorized wheelchair her place of living.  Has not been using it as much so secondary to her husband being more sick recently.  Has had 1 fall since we have seen her.  Nervous that that caused some mild increase in pain in the knees.  Has not noticed any more swelling now.       Past Medical History:  Diagnosis Date   Allergy    Anemia    Cancer of left breast ([place]) 1978   CHF (congestive heart failure) ([place])    Chronic thoracic back pain    "T8; fracture; 03/2015; no OR" (05/05/2016)   GERD (gastroesophageal reflux disease)    Heart murmur    History of hiatal hernia    Hyperlipidemia    Hyperplastic colon polyp    Hypertension    IBS (irritable bowel syndrome)    Multiple thyroid nodules    Osteoporosis    T8 compression fx 03/2015    Personal history of radiation therapy    Presence of permanent cardiac pacemaker    Vitamin B12 deficiency    Past Surgical History:  Procedure Laterality Date   ANTERIOR CERVICAL DECOMP/DISCECTOMY FUSION  2008   C5-6   AUGMENTATION MAMMAPLASTY     BACK SURGERY     BREAST SURGERY     CARDIAC CATHETERIZATION  2001   EP IMPLANTABLE DEVICE N/A 05/05/2016   Procedure: Pacemaker Implant;  Surgeon: Evans Lance, MD;  Location: [facility];  Service: Cardiovascular;  Laterality: N/A;   EXCISIONAL HEMORRHOIDECTOMY  1984   With subsequent correction of surgery   INSERT / REPLACE / REMOVE PACEMAKER     INTRAVASCULAR PRESSURE WIRE/FFR STUDY N/A 10/07/2019   Procedure: INTRAVASCULAR PRESSURE  WIRE/FFR STUDY;  Surgeon: Jettie Booze, MD;  Location: [facility];  Service: Cardiovascular;  Laterality: N/A;   KNEE ARTHROSCOPY Left    "meniscus tear"   LEFT HEART CATH AND CORONARY ANGIOGRAPHY N/A 10/07/2019   Procedure: LEFT HEART CATH AND CORONARY ANGIOGRAPHY;  Surgeon: Jettie Booze, MD;  Location: [facility];  Service: Cardiovascular;  Laterality: N/A;   MASTECTOMY Left 1978   PLACEMENT OF BREAST IMPLANTS Left 1981   REDUCTION MAMMAPLASTY Right    SHOULDER ARTHROSCOPY W/ ROTATOR CUFF REPAIR Left 02/2006   "tear"   TUBAL LIGATION     Social History   Socioeconomic History   Marital status: Married    Spouse name: Not on file   Number of children: 2   Years of education: Not on file   Highest education level: Not on file  Occupational History   Occupation: retired  Tobacco Use   Smoking status: Never   Smokeless tobacco: Never  Vaping Use   Vaping Use: Never used  Substance and Sexual Activity   Alcohol use: No    Alcohol/week: 0.0 standard drinks of alcohol   Drug use: No   Sexual activity: Not Currently  Other Topics Concern   Not on file  Social History Narrative  Housewife, Lives with spouse, 2 children   Social Determinants of Health   Financial Resource Strain: Low Risk  (04/04/2021)   Overall Financial Resource Strain (CARDIA)    Difficulty of Paying Living Expenses: Not hard at all  Food Insecurity: No Food Insecurity (04/04/2021)   Hunger Vital Sign    Worried About Running Out of Food in the Last Year: Never true    Ran Out of Food in the Last Year: Never true  Transportation Needs: No Transportation Needs (04/04/2021)   PRAPARE - Hydrologist (Medical): No    Lack of Transportation (Non-Medical): No  Physical Activity: Inactive (04/04/2021)   Exercise Vital Sign    Days of Exercise per Week: 0 days    Minutes of Exercise per Session: 0 min  Stress: No Stress Concern Present (04/04/2021)   [place]    Feeling of Stress : Not at all  Social Connections: Socially Integrated (04/04/2021)   Social Connection and Isolation Panel [NHANES]    Frequency of Communication with Friends and Family: More than three times a week    Frequency of Social Gatherings with Friends and Family: More than three times a week    Attends Religious Services: 1 to 4 times per year    Active Member of [employer] or Organizations: No    Attends Music therapist: 1 to 4 times per year    Marital Status: Married   Allergies  Allergen Reactions   Statins     Lipitor caused hospitalization - - depletion of electrolytes, fever, nausea, loss of appetite, couldn't get out of bed   Cymbalta [Duloxetine Hcl] Other (See Comments)    Dulled her too much, difficulty urinating, change in vision   Fosamax [Alendronate Sodium] Other (See Comments)    Caused chronic issues swallowing   Amitriptyline     hallucinations   Lyrica [Pregabalin]     Unknown   Neurontin [Gabapentin] Other (See Comments)    Dizziness and sedation (patient is tolerating in lower dose)   Zanaflex [Tizanidine] Other (See Comments)    Could not sleep   Family History  Problem Relation Age of Onset   Stroke Sister    Diabetes Brother    Breast cancer Maternal Aunt    Atrial fibrillation Sister    Colon cancer Neg Hx    Stomach cancer Neg Hx     Current Outpatient Medications (Endocrine & Metabolic):    denosumab (PROLIA) 60 MG/ML SOSY injection, Inject 60 mg into the skin every 6 (six) months.  Current Outpatient Medications (Cardiovascular):    ezetimibe (ZETIA) 10 MG tablet, TAKE ONE TABLET BY MOUTH EVERY MORNING   metoprolol succinate (TOPROL XL) 25 MG 24 hr tablet, Take 0.5 tablets (12.5 mg total) by mouth daily.   torsemide (DEMADEX) 5 MG tablet, Take 5 mg by mouth daily.   Current Outpatient Medications (Analgesics):    acetaminophen (TYLENOL) 500 MG tablet,  Take 500 mg by mouth 2 (two) times daily.  Current Outpatient Medications (Hematological):    Cyanocobalamin 1000 MCG TBCR, Take 1,000 mcg by mouth daily.    ferrous sulfate 325 (65 FE) MG tablet, Take 325 mg by mouth 3 (three) times a week.  Current Outpatient Medications (Other):    busPIRone (BUSPAR) 10 MG tablet, Take 1 tablet (10 mg total) by mouth 3 (three) times daily as needed.   cholecalciferol (VITAMIN D) 1000 UNITS tablet, Take 2,000 Units by  mouth daily.    diclofenac Sodium (VOLTAREN) 1 % GEL, Apply 2 g topically 4 (four) times daily.   escitalopram (LEXAPRO) 10 MG tablet, Take 1 tablet (10 mg total) by mouth daily.   Misc. Devices (WHEELCHAIR) MISC, Power Wheelchair   omeprazole (PRILOSEC) 40 MG capsule, TAKE 1 CAPSULE BY MOUTH TWICE A DAY FOR GERD.   Polyethyl Glycol-Propyl Glycol (LUBRICANT EYE DROPS) 0.4-0.3 % SOLN, Place 1 drop into both eyes 3 (three) times daily as needed (dry/irritated eyes.).   polyethylene glycol (MIRALAX / GLYCOLAX) packet, Take 17 g by mouth daily as needed (constipation).    Review of Systems:  No headache, visual changes, nausea, vomiting, diarrhea, constipation, dizziness, abdominal pain, skin rash, fevers, chills, night sweats, weight loss, swollen lymph nodes,  chest pain, shortness of breath, mood changes. POSITIVE muscle aches, body aches, joint swelling  Objective  Pulse 60, height '5\' 1"'$  (1.549 m), SpO2 98 %.   General: Alert but not oriented masked facies noted.  Patient does have a tremor noted of mostly the right upper extremity but can intermittently have it in the left upper extremity. HEENT: Pupils equal, extraocular movements intact  Respiratory: Patient's speak in full sentences and does not appear short of breath  Cardiovascular: No lower extremity edema, non tender, no erythema  Bilateral knees do have arthritic changes noted.  Diffuse tenderness to palpation in all areas.  Some instability noted with valgus and varus force.   Crepitus noted.  After informed written and verbal consent, patient was seated on exam table. Right knee was prepped with alcohol swab and utilizing anterolateral approach, patient's right knee space was injected with 4:1  marcaine 0.5%: Kenalog '40mg'$ /dL. Patient tolerated the procedure well without immediate complications.  After informed written and verbal consent, patient was seated on exam table. Left knee was prepped with alcohol swab and utilizing anterolateral approach, patient's left knee space was injected with 4:1  marcaine 0.5%: Kenalog '40mg'$ /dL. Patient tolerated the procedure well without immediate complications.    Impression and Recommendations:

## 2022-01-15 NOTE — Assessment & Plan Note (Signed)
Bilateral injections given today.  Tolerated the procedure well, discussed with patient that the arthritic changes are there.  Patient would not be a surgical candidate secondary to her comorbidities.  Could be a candidate for viscosupplementation and we will try to get approval for this.  Discussed icing regimen and home exercises otherwise.  Follow-up with me again in 12 weeks

## 2022-01-15 NOTE — Patient Instructions (Signed)
Steroid injections in knees today See you again in 3 months

## 2022-01-21 NOTE — Progress Notes (Signed)
Assessment/Plan:   1.  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  -cannot do MRI brain due to PPM that is not MRI compatible.  -DaT scan positive, with decreased uptake in bilateral putamen  -following with Dr. Renda Rolls  -Now utilizing power wheelchair.  -Future planning discussed, especially with husband's ailing health.  This was the majority of the visit.  -Patient has both physiologic tremor as well as functional/psychogenic tremor noted again today.  We discussed the stress component.  -asked for dnr.  And we discussed this in detail.  Last visit, she did not want to be DNR, so I discussed in detail exactly what DNR was and she expressed understanding and her desire to be a DNR.  Forms were filled out.  -Discussed skin biopsies.  Discussed what they would offer and what it would not.  She has declined.   2.  Orthostatic hypotension  -Following with cardiology.  No longer on Lasix.  Metoprolol was previously cut in half.  I wonder if she still needs it as she is feeling very tired and her blood pressure is still quite low.  Told her she needed to follow-up with cardiology.    3.  GAD/depression  -on buspar  -Increase Lexapro, 20 mg daily   4.  Confusion  -Did not appear confused today.  -Patient canceled neurocognitive testing that was scheduled in the past.  5.  Dysphagia  - MBE was completed on March 06, 2021.  Swallowing function was worse than 1 year prior.  There was evidence of oropharyngeal and pharyngeal esophageal phase dysphagia mechanical soft solids, thin liquids, meds crushed with pure recommended.  Patient thinks that this is getting worse.  Was going to consider repeating this, but since she decided to make herself DNR, I decided to hold on that until they have further family discussions.   Subjective:   [name] was seen today in follow up for newly diagnosed parkinsonism.  Patient with her husband and  grandson who supplements the history.   Patient was able to finally get her power wheelchair since our last visit.  Stress has been a big thing with her husband's ailing health.  She is having more tremor.  She would like to stay in the current home, but they are not sure that that is going to be realistic over the long-term.  They really need to look at the finances.  Current movement disorder medications: Lexapro, 10 mg daily (increased last visit)  Prior medications: Levodopa (discontinued because not helpful)  ALLERGIES:   Allergies  Allergen Reactions   Statins     Lipitor caused hospitalization - - depletion of electrolytes, fever, nausea, loss of appetite, couldn't get out of bed   Cymbalta [Duloxetine Hcl] Other (See Comments)    Dulled her too much, difficulty urinating, change in vision   Fosamax [Alendronate Sodium] Other (See Comments)    Caused chronic issues swallowing   Amitriptyline     hallucinations   Lyrica [Pregabalin]     Unknown   Neurontin [Gabapentin] Other (See Comments)    Dizziness and sedation (patient is tolerating in lower dose)   Zanaflex [Tizanidine] Other (See Comments)    Could not sleep    CURRENT MEDICATIONS:  Outpatient Encounter Medications as of 01/23/2022  Medication Sig   acetaminophen (TYLENOL) 500 MG tablet Take 500 mg by mouth 2 (two) times daily.   busPIRone (BUSPAR) 10 MG tablet Take 1 tablet (10 mg total) by  mouth 3 (three) times daily as needed.   cholecalciferol (VITAMIN D) 1000 UNITS tablet Take 2,000 Units by mouth daily.    Cyanocobalamin 1000 MCG TBCR Take 1,000 mcg by mouth daily.    denosumab (PROLIA) 60 MG/ML SOSY injection Inject 60 mg into the skin every 6 (six) months.   diclofenac Sodium (VOLTAREN) 1 % GEL Apply 2 g topically 4 (four) times daily.   escitalopram (LEXAPRO) 10 MG tablet Take 1 tablet (10 mg total) by mouth daily.   ezetimibe (ZETIA) 10 MG tablet TAKE ONE TABLET BY MOUTH EVERY MORNING   ferrous sulfate 325  (65 FE) MG tablet Take 325 mg by mouth 3 (three) times a week.   metoprolol succinate (TOPROL XL) 25 MG 24 hr tablet Take 0.5 tablets (12.5 mg total) by mouth daily.   Misc. Devices [facility]) MISC Power Wheelchair   omeprazole (PRILOSEC) 40 MG capsule TAKE 1 CAPSULE BY MOUTH TWICE A DAY FOR GERD.   Polyethyl Glycol-Propyl Glycol (LUBRICANT EYE DROPS) 0.4-0.3 % SOLN Place 1 drop into both eyes 3 (three) times daily as needed (dry/irritated eyes.).   polyethylene glycol (MIRALAX / GLYCOLAX) packet Take 17 g by mouth daily as needed (constipation).   torsemide (DEMADEX) 5 MG tablet Take 5 mg by mouth daily.   No facility-administered encounter medications on file as of 01/23/2022.    Objective:   PHYSICAL EXAMINATION:    VITALS:   Vitals:   01/23/22 1346  BP: (!) 108/54  Pulse: 85  SpO2: 92%  Weight: 155 lb (70.3 kg)  Height: '5\' 1"'$  (1.549 m)       No data found.   GEN:  The patient appears stated age and is in NAD. HEENT:  Normocephalic, atraumatic.  The mucous membranes are moist.   Neurological examination:  Orientation: The patient is alert and oriented x3. Cranial nerves: There is good facial symmetry with facial hypomimia.  Extraocular muscles are intact.  She does have decreased blink.  The speech is fluent and clear.  Abnormal movements: There is LEFT UPPER EXTREMITY rest tremor.  There is also functional tremor that is more exaggerated and there is entrainment associated with that.   Gait:  not tested today due to previous experience (see prior note).  In [place].   I have reviewed and interpreted the following labs independently    Chemistry      Component Value Date/Time   NA 134 (L) 01/15/2021 1459   NA 135 10/05/2020 1150   K 4.2 01/15/2021 1459   CL 102 01/15/2021 1459   CO2 25 01/15/2021 1459   BUN 9 01/15/2021 1459   BUN 12 10/05/2020 1150   CREATININE 0.87 01/15/2021 1459   CREATININE 1.23 (H) 03/30/2020 1011      Component Value Date/Time   CALCIUM  8.7 01/15/2021 1459   ALKPHOS 34 (L) 01/15/2021 1459   AST 12 01/15/2021 1459   ALT 4 01/15/2021 1459   BILITOT 0.3 01/15/2021 1459       Lab Results  Component Value Date   WBC 6.7 01/15/2021   HGB 12.0 01/15/2021   HCT 35.4 (L) 01/15/2021   MCV 96.3 01/15/2021   PLT 308.0 01/15/2021    Lab Results  Component Value Date   TSH 1.85 02/03/2020     Total time spent on today's visit was 30 minutes, including both face-to-face time and nonface-to-face time.  Time included that spent on review of records (prior notes available to me/labs/imaging if pertinent), discussing treatment and goals, answering  patient's questions and coordinating care.  Cc:  [name], MD

## 2022-01-23 ENCOUNTER — Encounter: Payer: Self-pay | Admitting: Neurology

## 2022-01-23 ENCOUNTER — Ambulatory Visit (INDEPENDENT_AMBULATORY_CARE_PROVIDER_SITE_OTHER): Payer: Medicare Other | Admitting: Neurology

## 2022-01-23 VITALS — BP 108/54 | HR 85 | Ht 61.0 in | Wt 155.0 lb

## 2022-01-23 DIAGNOSIS — G903 Multi-system degeneration of the autonomic nervous system: Secondary | ICD-10-CM | POA: Diagnosis not present

## 2022-01-23 DIAGNOSIS — I2510 Atherosclerotic heart disease of native coronary artery without angina pectoris: Secondary | ICD-10-CM | POA: Diagnosis not present

## 2022-01-23 DIAGNOSIS — G20 Parkinson's disease: Secondary | ICD-10-CM

## 2022-01-23 MED ORDER — ESCITALOPRAM OXALATE 20 MG PO TABS: 20.0000 mg | ORAL_TABLET | Freq: Every day | ORAL | 1 refills | Status: DC

## 2022-01-29 ENCOUNTER — Ambulatory Visit: Payer: Medicare Other

## 2022-02-01 LAB — CUP PACEART REMOTE DEVICE CHECK
Battery Remaining Longevity: 102 mo
Battery Remaining Percentage: 100 %
Brady Statistic RA Percent Paced: 70 %
Brady Statistic RV Percent Paced: 0 %
Date Time Interrogation Session: 20230704030200
Implantable Lead Implant Date: 20171009
Implantable Lead Implant Date: 20171009
Implantable Lead Location: 753859
Implantable Lead Location: 753860
Implantable Lead Model: 5076
Implantable Lead Model: 7741
Implantable Lead Serial Number: 785430
Implantable Pulse Generator Implant Date: 20171009
Lead Channel Impedance Value: 1888 Ohm
Lead Channel Impedance Value: 360 Ohm
Lead Channel Setting Pacing Amplitude: 2 V
Lead Channel Setting Pacing Amplitude: 2.6 V
Lead Channel Setting Pacing Pulse Width: 1 ms
Lead Channel Setting Sensing Sensitivity: 2.5 mV
Pulse Gen Serial Number: 766467

## 2022-02-05 DIAGNOSIS — F411 Generalized anxiety disorder: Secondary | ICD-10-CM | POA: Diagnosis not present

## 2022-02-05 DIAGNOSIS — F32A Depression, unspecified: Secondary | ICD-10-CM | POA: Diagnosis not present

## 2022-02-24 ENCOUNTER — Encounter (HOSPITAL_BASED_OUTPATIENT_CLINIC_OR_DEPARTMENT_OTHER): Payer: Self-pay | Admitting: Emergency Medicine

## 2022-02-24 ENCOUNTER — Emergency Department (HOSPITAL_BASED_OUTPATIENT_CLINIC_OR_DEPARTMENT_OTHER): Payer: Medicare Other | Admitting: Radiology

## 2022-02-24 ENCOUNTER — Other Ambulatory Visit: Payer: Self-pay

## 2022-02-24 ENCOUNTER — Emergency Department (HOSPITAL_BASED_OUTPATIENT_CLINIC_OR_DEPARTMENT_OTHER): Payer: Medicare Other

## 2022-02-24 ENCOUNTER — Telehealth: Payer: Self-pay | Admitting: Neurology

## 2022-02-24 ENCOUNTER — Emergency Department (HOSPITAL_BASED_OUTPATIENT_CLINIC_OR_DEPARTMENT_OTHER)
Admission: EM | Admit: 2022-02-24 | Discharge: 2022-02-25 | Disposition: A | Discharge status: Home / Self Care | Payer: Medicare Other | Attending: Emergency Medicine | Admitting: Emergency Medicine

## 2022-02-24 DIAGNOSIS — R519 Headache, unspecified: Secondary | ICD-10-CM | POA: Diagnosis not present

## 2022-02-24 DIAGNOSIS — Z79899 Other long term (current) drug therapy: Secondary | ICD-10-CM | POA: Insufficient documentation

## 2022-02-24 DIAGNOSIS — I959 Hypotension, unspecified: Secondary | ICD-10-CM | POA: Diagnosis not present

## 2022-02-24 DIAGNOSIS — Z853 Personal history of malignant neoplasm of breast: Secondary | ICD-10-CM | POA: Insufficient documentation

## 2022-02-24 DIAGNOSIS — Z20822 Contact with and (suspected) exposure to covid-19: Secondary | ICD-10-CM | POA: Insufficient documentation

## 2022-02-24 DIAGNOSIS — I2510 Atherosclerotic heart disease of native coronary artery without angina pectoris: Secondary | ICD-10-CM | POA: Insufficient documentation

## 2022-02-24 DIAGNOSIS — R531 Weakness: Secondary | ICD-10-CM | POA: Insufficient documentation

## 2022-02-24 DIAGNOSIS — I110 Hypertensive heart disease with heart failure: Secondary | ICD-10-CM | POA: Insufficient documentation

## 2022-02-24 DIAGNOSIS — G20 Parkinson's disease: Secondary | ICD-10-CM | POA: Insufficient documentation

## 2022-02-24 DIAGNOSIS — I509 Heart failure, unspecified: Secondary | ICD-10-CM | POA: Insufficient documentation

## 2022-02-24 DIAGNOSIS — Z950 Presence of cardiac pacemaker: Secondary | ICD-10-CM | POA: Insufficient documentation

## 2022-02-24 LAB — URINALYSIS, ROUTINE W REFLEX MICROSCOPIC
Bilirubin Urine: NEGATIVE
Glucose, UA: NEGATIVE mg/dL
Hgb urine dipstick: NEGATIVE
Ketones, ur: NEGATIVE mg/dL
Nitrite: NEGATIVE
Protein, ur: NEGATIVE mg/dL
Specific Gravity, Urine: 1.009 (ref 1.005–1.030)
pH: 5.5 (ref 5.0–8.0)

## 2022-02-24 LAB — BASIC METABOLIC PANEL
Anion gap: 14 (ref 5–15)
BUN: 21 mg/dL (ref 8–23)
CO2: 24 mmol/L (ref 22–32)
Calcium: 9.9 mg/dL (ref 8.9–10.3)
Chloride: 99 mmol/L (ref 98–111)
Creatinine, Ser: 1.32 mg/dL — ABNORMAL HIGH (ref 0.44–1.00)
GFR, Estimated: 40 mL/min — ABNORMAL LOW (ref >60)
Glucose, Bld: 97 mg/dL (ref 70–99)
Potassium: 4.1 mmol/L (ref 3.5–5.1)
Sodium: 137 mmol/L (ref 135–145)

## 2022-02-24 LAB — CBC
HCT: 34.1 % — ABNORMAL LOW (ref 36.0–46.0)
Hemoglobin: 11.6 g/dL — ABNORMAL LOW (ref 12.0–15.0)
MCH: 33.3 pg (ref 26.0–34.0)
MCHC: 34 g/dL (ref 30.0–36.0)
MCV: 98 fL (ref 80.0–100.0)
Platelets: 321 10*3/uL (ref 150–400)
RBC: 3.48 MIL/uL — ABNORMAL LOW (ref 3.87–5.11)
RDW: 13.3 % (ref 11.5–15.5)
WBC: 8.2 10*3/uL (ref 4.0–10.5)
nRBC: 0 % (ref 0.0–0.2)

## 2022-02-24 LAB — TROPONIN I (HIGH SENSITIVITY)
Troponin I (High Sensitivity): 9 ng/L (ref <18)
Troponin I (High Sensitivity): 9 ng/L (ref <18)

## 2022-02-24 LAB — SARS CORONAVIRUS 2 BY RT PCR: SARS Coronavirus 2 by RT PCR: NEGATIVE

## 2022-02-24 NOTE — Discharge Instructions (Signed)
Recommend decreasing lexapro back to '10mg'$  to see if this helps your symptoms.  Follow up with your Neurologist, Cardiologist and PCP.

## 2022-02-24 NOTE — ED Triage Notes (Signed)
Patient reports to the ER via EMS:  Patient coming from [place] living  Patient reports to the ER for x3 days for generalized weakness. She has a hx of parkinson's. Patient denies new pain. Patient denies any recent difficulty urinating, chest pain or ShOB.   Patient recently had a change in her Lexapro dose.   Vitals: 112/60 60 CBG 115 SPO2 97% on RA

## 2022-02-24 NOTE — ED Notes (Signed)
Called [facility] at 905pm, patient is fourth on the list for pickup

## 2022-02-24 NOTE — ED Notes (Signed)
Spoke to [employer] and they will come in the am to her house to check her pacemaker. Md notified.

## 2022-02-24 NOTE — Telephone Encounter (Signed)
Called patients daughter Olegario Shearer and she informed me that since patients Lexapro was increased at her last visit patient has been sleeping a lot and has been complaining that her head and neck have been feeling heavy. She would like to know if Lexapro could be reduced?   Also, patients daughter stated that patients blood pressure has been running low. I asked if patient has followed up with Cardiology like Dr. Carles Collet recommended at her appt? Patients daughter stated that she doesn't know when her follow up is with Cardiology. I informed patients daughter that at patients last appt with Dr. Carles Collet she was informed to follow up with cardiology for her low blood pressure and tiredness.   Informed patients daughter that I will send this messages to Dr. Carles Collet and give her a call back.

## 2022-02-24 NOTE — ED Notes (Signed)
Patient transported to CT 

## 2022-02-24 NOTE — ED Notes (Signed)
Spoke to multiple representatives about [employer] pacemaker lateral reader. Unable to send reports at this time, MD aware and [employer] sending out representative. Pt updated.

## 2022-02-24 NOTE — Telephone Encounter (Signed)
Called patients daughter Olegario Shearer and left a message for a call back.

## 2022-02-24 NOTE — ED Provider Notes (Signed)
[facility] Provider Note   CSN: [id number] Arrival date & time: 02/24/22  1447     History {Add pertinent medical, surgical, social history, OB history to HPI:1} No chief complaint on file.   [name] is a 87 y.o. female.  HPI      87 year old female with a history of parkinsonism, likely atypical state and suspect PSP, orthostatic hypotension for which she is followed with cardiology, generalized anxiety disorder/depression, hypertension, hyperlipidemia, dysphagia, chronic CHF, sizer dysfunction with pacemaker in place, coronary artery disease, breast cancer   Lexapro dose was recently increased with neurology to 20 mg on June 29  Was going to the bathroom, stood from wheelchair and med tech had to catch her and ease her to ground Had anotehr epiosde walked between wheelchair and couch, next thing she was falling and fell back onto the sofa, bruised arm-happened last week. Getting coffee for husband.   Balance has been terrible all week. Going to bed at 9PM, walking up at [place] a lot, not feeling like doing anything. Thought maybe it was the lexapro, and seems like since then it has been happening.  Just feel like eating, sleeping and that's it.  Could move around with wheelchair before and didn't feel like would fall up until last week. Feelling like can't think straight, didn't feel good in head. Hit head badly in one fall December.     Two falls in the last week. One day this week had a headache that didn't last. Nausea last night briefly that improved. No chest pain or dyspnea, no cough, no urinary symptoms.  Denies numbness, weakness, difficulty talking or walking, visual changes or facial droop.   Just feels sleepy, generally weak. No other new medications. Taking care of husband who also has parkinson's.   Past Medical History:  Diagnosis Date   Allergy    Anemia    Cancer of left breast ([place]) 1978   CHF (congestive heart  failure) ([place])    Chronic thoracic back pain    "T8; fracture; 03/2015; no OR" (05/05/2016)   GERD (gastroesophageal reflux disease)    Heart murmur    History of hiatal hernia    Hyperlipidemia    Hyperplastic colon polyp    Hypertension    IBS (irritable bowel syndrome)    Multiple thyroid nodules    Osteoporosis    T8 compression fx 03/2015    Personal history of radiation therapy    Presence of permanent cardiac pacemaker    Vitamin B12 deficiency      Home Medications Prior to Admission medications   Medication Sig Start Date End Date Taking? Authorizing Provider  acetaminophen (TYLENOL) 500 MG tablet Take 500 mg by mouth 2 (two) times daily.    [provider]  busPIRone (BUSPAR) 10 MG tablet Take 1 tablet (10 mg total) by mouth 3 (three) times daily as needed. 01/18/21   Biagio Borg, MD  cholecalciferol (VITAMIN D) 1000 UNITS tablet Take 2,000 Units by mouth daily.     [provider]  Cyanocobalamin 1000 MCG TBCR Take 1,000 mcg by mouth daily.     [provider]  denosumab (PROLIA) 60 MG/ML SOSY injection Inject 60 mg into the skin every 6 (six) months.    [provider]  diclofenac Sodium (VOLTAREN) 1 % GEL Apply 2 g topically 4 (four) times daily.    [provider]  escitalopram (LEXAPRO) 20 MG tablet Take 1 tablet (20 mg total) by  mouth daily. 01/23/22   Tat, Eustace Quail, DO  ezetimibe (ZETIA) 10 MG tablet TAKE ONE TABLET BY MOUTH EVERY MORNING 04/04/21   [name], MD  ferrous sulfate 325 (65 FE) MG tablet Take 325 mg by mouth 3 (three) times a week.    [provider]  metoprolol succinate (TOPROL XL) 25 MG 24 hr tablet Take 0.5 tablets (12.5 mg total) by mouth daily. 12/19/20   [name], MD  Misc. Devices [facility]) [place] Power Wheelchair 03/28/21   Tat, Eustace Quail, DO  omeprazole (PRILOSEC) 40 MG capsule TAKE 1 CAPSULE BY MOUTH TWICE A DAY FOR GERD. 05/07/21   Burns, Claudina Lick, MD  Polyethyl Glycol-Propyl Glycol  (LUBRICANT EYE DROPS) 0.4-0.3 % SOLN Place 1 drop into both eyes 3 (three) times daily as needed (dry/irritated eyes.).    [provider]  polyethylene glycol (MIRALAX / GLYCOLAX) packet Take 17 g by mouth daily as needed (constipation).    [provider]  torsemide (DEMADEX) 5 MG tablet Take 5 mg by mouth daily.    [provider]      Allergies    Statins, Cymbalta [duloxetine hcl], Fosamax [alendronate sodium], Amitriptyline, Lyrica [pregabalin], Neurontin [gabapentin], and Zanaflex [tizanidine]    Review of Systems   Review of Systems  Physical Exam Updated Vital Signs BP (!) 123/56 (BP Location: Right Arm)   Pulse 63   Temp 98.4 F (36.9 C)   Resp 16   SpO2 100%  Physical Exam  ED Results / Procedures / Treatments   Labs (all labs ordered are listed, but only abnormal results are displayed) Labs Reviewed  BASIC METABOLIC PANEL - Abnormal; Notable for the following components:      Result Value   Creatinine, Ser 1.32 (*)    GFR, Estimated 40 (*)    All other components within normal limits  CBC - Abnormal; Notable for the following components:   RBC 3.48 (*)    Hemoglobin 11.6 (*)    HCT 34.1 (*)    All other components within normal limits  URINALYSIS, ROUTINE W REFLEX MICROSCOPIC  TROPONIN I (HIGH SENSITIVITY)  TROPONIN I (HIGH SENSITIVITY)    EKG None  Radiology DG Chest 2 View  Result Date: 02/24/2022 CLINICAL DATA:  Weakness EXAM: CHEST - 2 VIEW COMPARISON:  Chest x-ray dated March 27, 2020 FINDINGS: Heart size and mediastinal contours are within normal limits. Mitral annular calcifications. Right chest wall dual lead pacer with leads overlying the expected area of the right atrium and right ventricle. Both lungs are clear. The visualized skeletal structures are unremarkable. IMPRESSION: No acute cardiopulmonary abnormality. Electronically Signed   By: Yetta Glassman M.D.   On: 02/24/2022 16:18    Procedures Procedures   {Document cardiac monitor, telemetry assessment procedure when appropriate:1}  Medications Ordered in ED Medications - No data to display  ED Course/ Medical Decision Making/ A&P                           Medical Decision Making Amount and/or Complexity of Data Reviewed Labs: ordered. Radiology: ordered.   ***  {Document critical care time when appropriate:1} {Document review of labs and clinical decision tools ie heart score, Chads2Vasc2 etc:1}  {Document your independent review of radiology images, and any outside records:1} {Document your discussion with family members, caretakers, and with consultants:1} {Document social determinants of health affecting pt's care:1} {Document your decision making why or why not admission, treatments were  needed:1} Final Clinical Impression(s) / ED Diagnoses Final diagnoses:  None    Rx / DC Orders ED Discharge Orders     None

## 2022-02-24 NOTE — Telephone Encounter (Signed)
Please call patient Daughter [name] about mother blood pressure too low and then some side effects from the medication

## 2022-02-25 DIAGNOSIS — R531 Weakness: Secondary | ICD-10-CM | POA: Diagnosis not present

## 2022-02-25 DIAGNOSIS — Z743 Need for continuous supervision: Secondary | ICD-10-CM | POA: Diagnosis not present

## 2022-02-25 DIAGNOSIS — R262 Difficulty in walking, not elsewhere classified: Secondary | ICD-10-CM | POA: Diagnosis not present

## 2022-02-25 MED ORDER — ESCITALOPRAM OXALATE 10 MG PO TABS: ORAL_TABLET | ORAL | 0 refills | Status: AC

## 2022-02-25 NOTE — ED Notes (Signed)
Pt assisted to the bathroom via wheelchair to attempt BM. Unsuccessful, pt passing gas without stool. Placed back in bed on monitor and external catheter. Denies other needs at this time.

## 2022-02-25 NOTE — Telephone Encounter (Signed)
Rx has been changed to Lexapro 10 mg daily. Printed and placed on Dr. Doristine Devoid desk for signature.

## 2022-02-25 NOTE — Telephone Encounter (Signed)
Rx has been faxed.

## 2022-02-25 NOTE — Telephone Encounter (Signed)
Called [name] and informed her of Dr. Doristine Devoid recommendation of reducing Lexapro 10 mg daily and to f/u with Cardiology. [name] informed me that patient will need a new rx sent to [facility] in [place] Attn: Varney Biles Devaughn at fax#:[phone].

## 2022-03-04 DIAGNOSIS — D519 Vitamin B12 deficiency anemia, unspecified: Secondary | ICD-10-CM | POA: Diagnosis not present

## 2022-03-04 DIAGNOSIS — I10 Essential (primary) hypertension: Secondary | ICD-10-CM | POA: Diagnosis not present

## 2022-03-04 DIAGNOSIS — E559 Vitamin D deficiency, unspecified: Secondary | ICD-10-CM | POA: Diagnosis not present

## 2022-03-05 NOTE — Progress Notes (Signed)
Electrophysiology Office Note Date: 03/12/2022  ID:  KINDEL ROCHEFORT, DOB [birth year], MRN [id number]  PCP: [name], MD Primary Cardiologist: Cristopher Peru, MD Electrophysiologist: Cristopher Peru, MD   CC: Pacemaker follow-up  [name] is a 87 y.o. female seen today for Cristopher Peru, MD for post hospital follow up.    Seen in ED 7/31 for general weakness. Had a near fall while going to the bathroom and getting up from [facility]. Fell onto sofa when transferring from [facility]. Worries about worsening balance and increased sleep. Had recent increase of her Lexapro. Also c/o brain fog. Intermittent nausea, and headache.  Work up was unremarkable. Unable to get MRI with her device.   Since discharge from hospital the patient reports doing about the same overall. She is mostly WC bound but is able to walk slowly and transfer slowly given her Parkinsons. No further falls. She is very fatigued all the time. She has an occasional "twinge" of discomfort on her left chest, but no exertional chest pain. No frank syncope. No edema since starting on torsemide.    Device History: BSci implanted 05/05/2016  Past Medical History:  Diagnosis Date   Allergy    Anemia    Cancer of left breast ([place]) 1978   CHF (congestive heart failure) (HCC)    Chronic thoracic back pain    "T8; fracture; 03/2015; no OR" (05/05/2016)   GERD (gastroesophageal reflux disease)    Heart murmur    History of hiatal hernia    Hyperlipidemia    Hyperplastic colon polyp    Hypertension    IBS (irritable bowel syndrome)    Multiple thyroid nodules    Osteoporosis    T8 compression fx 03/2015    Personal history of radiation therapy    Presence of permanent cardiac pacemaker    Vitamin B12 deficiency    Past Surgical History:  Procedure Laterality Date   ANTERIOR CERVICAL DECOMP/DISCECTOMY FUSION  2008   C5-6   AUGMENTATION MAMMAPLASTY     BACK SURGERY     BREAST SURGERY     CARDIAC CATHETERIZATION  2001   EP  IMPLANTABLE DEVICE N/A 05/05/2016   Procedure: Pacemaker Implant;  Surgeon: Evans Lance, MD;  Location: [place] [facility];  Service: Cardiovascular;  Laterality: N/A;   EXCISIONAL HEMORRHOIDECTOMY  1984   With subsequent correction of surgery   INSERT / REPLACE / REMOVE PACEMAKER     INTRAVASCULAR PRESSURE WIRE/FFR STUDY N/A 10/07/2019   Procedure: INTRAVASCULAR PRESSURE WIRE/FFR STUDY;  Surgeon: Jettie Booze, MD;  Location: [place] [facility];  Service: Cardiovascular;  Laterality: N/A;   KNEE ARTHROSCOPY Left    "meniscus tear"   LEFT HEART CATH AND CORONARY ANGIOGRAPHY N/A 10/07/2019   Procedure: LEFT HEART CATH AND CORONARY ANGIOGRAPHY;  Surgeon: Jettie Booze, MD;  Location: [place] [facility];  Service: Cardiovascular;  Laterality: N/A;   MASTECTOMY Left 1978   PLACEMENT OF BREAST IMPLANTS Left 1981   REDUCTION MAMMAPLASTY Right    SHOULDER ARTHROSCOPY W/ ROTATOR CUFF REPAIR Left 02/2006   "tear"   TUBAL LIGATION      Current Outpatient Medications  Medication Sig Dispense Refill   acetaminophen (TYLENOL) 500 MG tablet Take 500 mg by mouth 2 (two) times daily.     busPIRone (BUSPAR) 10 MG tablet Take 1 tablet (10 mg total) by mouth 3 (three) times daily as needed. (Patient taking differently: Take 10 mg by mouth 3 (three) times daily as needed (  anxiety).) 90 tablet 1   cholecalciferol (VITAMIN D) 1000 UNITS tablet Take 2,000 Units by mouth daily.      Cyanocobalamin 1000 MCG TBCR Take 1,000 mcg by mouth daily.      denosumab (PROLIA) 60 MG/ML SOSY injection Inject 60 mg into the skin every 6 (six) months.     diclofenac Sodium (VOLTAREN) 1 % GEL Apply 2 g topically 4 (four) times daily.     escitalopram (LEXAPRO) 10 MG tablet Take one tablet daily 90 tablet 0   ezetimibe (ZETIA) 10 MG tablet TAKE ONE TABLET BY MOUTH EVERY MORNING 90 tablet 2   ferrous sulfate 325 (65 FE) MG tablet Take 325 mg by mouth 3 (three) times a week.     metoprolol succinate (TOPROL XL) 25  MG 24 hr tablet Take 0.5 tablets (12.5 mg total) by mouth daily. 45 tablet 90   Misc. Devices [facility]) MISC Power Wheelchair 1 each 0   omeprazole (PRILOSEC) 40 MG capsule TAKE 1 CAPSULE BY MOUTH TWICE A DAY FOR GERD. 28 capsule 0   Polyethyl Glycol-Propyl Glycol (LUBRICANT EYE DROPS) 0.4-0.3 % SOLN Place 1 drop into both eyes 3 (three) times daily as needed (dry/irritated eyes.).     torsemide (DEMADEX) 5 MG tablet Take 5 mg by mouth daily.     polyethylene glycol (MIRALAX / GLYCOLAX) packet Take 17 g by mouth daily as needed (constipation). (Patient not taking: Reported on 03/12/2022)     No current facility-administered medications for this visit.    Allergies:   Statins, Cymbalta [duloxetine hcl], Fosamax [alendronate sodium], Amitriptyline, Lyrica [pregabalin], Neurontin [gabapentin], and Zanaflex [tizanidine]   Social History: Social History   Socioeconomic History   Marital status: Married    Spouse name: Not on file   Number of children: 2   Years of education: Not on file   Highest education level: Not on file  Occupational History   Occupation: retired  Tobacco Use   Smoking status: Never   Smokeless tobacco: Never  Vaping Use   Vaping Use: Never used  Substance and Sexual Activity   Alcohol use: No    Alcohol/week: 0.0 standard drinks of alcohol   Drug use: No   Sexual activity: Not Currently  Other Topics Concern   Not on file  Social History Narrative   Housewife, Lives with spouse, 2 children   Social Determinants of Health   Financial Resource Strain: Low Risk  (04/04/2021)   Overall Financial Resource Strain (CARDIA)    Difficulty of Paying Living Expenses: Not hard at all  Food Insecurity: No Food Insecurity (04/04/2021)   Hunger Vital Sign    Worried About Running Out of Food in the Last Year: Never true    [place] in the Last Year: Never true  Transportation Needs: No Transportation Needs (04/04/2021)   PRAPARE - Radiographer, therapeutic (Medical): No    Lack of Transportation (Non-Medical): No  Physical Activity: Inactive (04/04/2021)   Exercise Vital Sign    Days of Exercise per Week: 0 days    Minutes of Exercise per Session: 0 min  Stress: No Stress Concern Present (04/04/2021)   [place]    Feeling of Stress : Not at all  Social Connections: [place] (04/04/2021)   Social Connection and Isolation Panel [NHANES]    Frequency of Communication with Friends and Family: More than three times a week    Frequency of Social  Gatherings with Friends and Family: More than three times a week    Attends Religious Services: 1 to 4 times per year    Active Member of [employer] or Organizations: No    Attends Music therapist: 1 to 4 times per year    Marital Status: Married  Human resources officer Violence: Not At Risk (04/04/2021)   Humiliation, Afraid, Rape, and Kick questionnaire    Fear of Current or Ex-Partner: No    Emotionally Abused: No    Physically Abused: No    Sexually Abused: No    Family History: Family History  Problem Relation Age of Onset   Stroke Sister    Diabetes Brother    Breast cancer Maternal Aunt    Atrial fibrillation Sister    Colon cancer Neg Hx    Stomach cancer Neg Hx      Review of Systems: All other systems reviewed and are otherwise negative except as noted above.  Physical Exam: Vitals:   03/12/22 1009  BP: (!) 154/62  Pulse: 64  SpO2: 96%  Weight: 151 lb 12.8 oz (68.9 kg)  Height: '5\' 1"'$  (1.549 m)     GEN- The patient is well appearing, alert and oriented x 3 today.   HEENT: normocephalic, atraumatic; sclera clear, conjunctiva pink; hearing intact; oropharynx clear; neck supple  Lungs- Clear to ausculation bilaterally, normal work of breathing.  No wheezes, rales, rhonchi Heart- Regular rate and rhythm, no murmurs, rubs or gallops  GI- soft, non-tender, non-distended, bowel sounds  present  Extremities- no clubbing or cyanosis. No edema MS- no significant deformity or atrophy Skin- warm and dry, no rash or lesion; PPM pocket well healed Psych- euthymic mood, full affect Neuro- strength and sensation are intact  PPM Interrogation- reviewed in detail today,  See PACEART report  EKG:  EKG is not ordered today. Personal review of ekg ordered  02/24/2022  shows NSR at 65 bpm  Recent Labs: 02/24/2022: BUN 21; Creatinine, Ser 1.32; Hemoglobin 11.6; Platelets 321; Potassium 4.1; Sodium 137   Wt Readings from Last 3 Encounters:  03/12/22 151 lb 12.8 oz (68.9 kg)  01/23/22 155 lb (70.3 kg)  10/16/21 157 lb (71.2 kg)     Other studies Reviewed: Additional studies/ records that were reviewed today include: Previous EP office notes, Previous remote checks, Most recent labwork.   Assessment and Plan:  1. SND s/p [employer] PPM  Normal PPM function See Claudia Desanctis Art report No changes today  2. HTN Stable on current regimen   3. CAD Denies s/s ischemia  4. Generalized Weakness 5. Parkinsons 6. Failure to Thrive Recent ED visit for worsening weakness and overall picture of failure to thrive without any specific laboratory abnormality.  This is multifactorial. No focal cardiac abnormality.   7. Chronic diastolic CHF EF 76-22% in 2018 Continue torsemide 5 mg daily. BMET today.  Volume status appears stable on exam.   Current medicines are reviewed at length with the patient today.    Labs/ tests ordered today include:  Orders Placed This Encounter  Procedures   Basic metabolic panel   Disposition:   Follow up with Dr. Lovena Le in 6 months   Signed, Shirley Friar, PA-C  03/12/2022 10:33 AM  [employer] [address] [place] [address] [phone] (office) [phone] (fax)

## 2022-03-10 DIAGNOSIS — Z961 Presence of intraocular lens: Secondary | ICD-10-CM | POA: Diagnosis not present

## 2022-03-10 DIAGNOSIS — H31091 Other chorioretinal scars, right eye: Secondary | ICD-10-CM | POA: Diagnosis not present

## 2022-03-10 DIAGNOSIS — H35373 Puckering of macula, bilateral: Secondary | ICD-10-CM | POA: Diagnosis not present

## 2022-03-10 DIAGNOSIS — H04123 Dry eye syndrome of bilateral lacrimal glands: Secondary | ICD-10-CM | POA: Diagnosis not present

## 2022-03-10 DIAGNOSIS — H0102A Squamous blepharitis right eye, upper and lower eyelids: Secondary | ICD-10-CM | POA: Diagnosis not present

## 2022-03-12 ENCOUNTER — Ambulatory Visit (INDEPENDENT_AMBULATORY_CARE_PROVIDER_SITE_OTHER): Payer: Medicare Other | Admitting: Student

## 2022-03-12 ENCOUNTER — Encounter: Payer: Self-pay | Admitting: Student

## 2022-03-12 VITALS — BP 154/62 | HR 64 | Ht 61.0 in | Wt 151.8 lb

## 2022-03-12 DIAGNOSIS — I495 Sick sinus syndrome: Secondary | ICD-10-CM

## 2022-03-12 DIAGNOSIS — I10 Essential (primary) hypertension: Secondary | ICD-10-CM | POA: Diagnosis not present

## 2022-03-12 DIAGNOSIS — I2510 Atherosclerotic heart disease of native coronary artery without angina pectoris: Secondary | ICD-10-CM

## 2022-03-12 LAB — BASIC METABOLIC PANEL
BUN/Creatinine Ratio: 13 (ref 12–28)
BUN: 16 mg/dL (ref 8–27)
CO2: 23 mmol/L (ref 20–29)
Calcium: 9.8 mg/dL (ref 8.7–10.3)
Chloride: 101 mmol/L (ref 96–106)
Creatinine, Ser: 1.2 mg/dL — ABNORMAL HIGH (ref 0.57–1.00)
Glucose: 106 mg/dL — ABNORMAL HIGH (ref 70–99)
Potassium: 4.8 mmol/L (ref 3.5–5.2)
Sodium: 139 mmol/L (ref 134–144)
eGFR: 45 mL/min/{1.73_m2} — ABNORMAL LOW (ref >59)

## 2022-03-12 LAB — CUP PACEART INCLINIC DEVICE CHECK
Date Time Interrogation Session: 20230816110036
Implantable Lead Implant Date: 20171009
Implantable Lead Implant Date: 20171009
Implantable Lead Location: 753859
Implantable Lead Location: 753860
Implantable Lead Model: 5076
Implantable Lead Model: 7741
Implantable Lead Serial Number: 785430
Implantable Pulse Generator Implant Date: 20171009
Lead Channel Impedance Value: 1901 Ohm
Lead Channel Impedance Value: 365 Ohm
Lead Channel Pacing Threshold Amplitude: 0.5 V
Lead Channel Pacing Threshold Amplitude: 0.8 V
Lead Channel Pacing Threshold Pulse Width: 1 ms
Lead Channel Pacing Threshold Pulse Width: 1 ms
Lead Channel Sensing Intrinsic Amplitude: 9.2 mV
Lead Channel Setting Pacing Amplitude: 2 V
Lead Channel Setting Pacing Amplitude: 2.6 V
Lead Channel Setting Pacing Pulse Width: 1 ms
Lead Channel Setting Sensing Sensitivity: 2.5 mV
Pulse Gen Serial Number: 766467

## 2022-03-12 NOTE — Patient Instructions (Signed)
Medication Instructions:  Your physician recommends that you continue on your current medications as directed. Please refer to the Current Medication list given to you today.  *If you need a refill on your cardiac medications before your next appointment, please call your pharmacy*   Lab Work: TODAY: BMET  If you have labs (blood work) drawn today and your tests are completely normal, you will receive your results only by: [place] (if you have MyChart) OR A paper copy in the mail If you have any lab test that is abnormal or we need to change your treatment, we will call you to review the results.   Follow-Up: At [facility], you and your health needs are our priority.  As part of our continuing mission to provide you with exceptional heart care, we have created designated Provider Care Teams.  These Care Teams include your primary Cardiologist (physician) and Advanced Practice Providers (APPs -  Physician Assistants and Nurse Practitioners) who all work together to provide you with the care you need, when you need it.   Your next appointment:   6 month(s)  The format for your next appointment:   In Person  Provider:   Cristopher Peru, MD{

## 2022-03-19 DIAGNOSIS — F32A Depression, unspecified: Secondary | ICD-10-CM | POA: Diagnosis not present

## 2022-03-19 DIAGNOSIS — F411 Generalized anxiety disorder: Secondary | ICD-10-CM | POA: Diagnosis not present

## 2022-03-25 DIAGNOSIS — D485 Neoplasm of uncertain behavior of skin: Secondary | ICD-10-CM | POA: Diagnosis not present

## 2022-03-25 DIAGNOSIS — L821 Other seborrheic keratosis: Secondary | ICD-10-CM | POA: Diagnosis not present

## 2022-03-25 DIAGNOSIS — L578 Other skin changes due to chronic exposure to nonionizing radiation: Secondary | ICD-10-CM | POA: Diagnosis not present

## 2022-03-25 DIAGNOSIS — L309 Dermatitis, unspecified: Secondary | ICD-10-CM | POA: Diagnosis not present

## 2022-03-25 DIAGNOSIS — Z85828 Personal history of other malignant neoplasm of skin: Secondary | ICD-10-CM | POA: Diagnosis not present

## 2022-03-25 DIAGNOSIS — C44629 Squamous cell carcinoma of skin of left upper limb, including shoulder: Secondary | ICD-10-CM | POA: Diagnosis not present

## 2022-03-25 DIAGNOSIS — L814 Other melanin hyperpigmentation: Secondary | ICD-10-CM | POA: Diagnosis not present

## 2022-03-25 DIAGNOSIS — D225 Melanocytic nevi of trunk: Secondary | ICD-10-CM | POA: Diagnosis not present

## 2022-04-01 DIAGNOSIS — C44629 Squamous cell carcinoma of skin of left upper limb, including shoulder: Secondary | ICD-10-CM | POA: Diagnosis not present

## 2022-04-14 DIAGNOSIS — C44629 Squamous cell carcinoma of skin of left upper limb, including shoulder: Secondary | ICD-10-CM | POA: Diagnosis not present

## 2022-04-15 NOTE — Progress Notes (Deleted)
[name] Phone: [phone] Subjective:    I'm seeing this patient by the request  of:  Pcp, No  CC:   [id number]  01/15/2022 Bilateral injections given today.  Tolerated the procedure well, discussed with patient that the arthritic changes are there.  Patient would not be a surgical candidate secondary to her comorbidities.  Could be a candidate for viscosupplementation and we will try to get approval for this.  Discussed icing regimen and home exercises otherwise.  Follow-up with me again in 12 weeks  Update 04/16/2022 [name] is a 87 y.o. female coming in with complaint of B knee pain. Patient states       Past Medical History:  Diagnosis Date   Allergy    Anemia    Cancer of left breast ([place]) 1978   CHF (congestive heart failure) ([place])    Chronic thoracic back pain    "T8; fracture; 03/2015; no OR" (05/05/2016)   GERD (gastroesophageal reflux disease)    Heart murmur    History of hiatal hernia    Hyperlipidemia    Hyperplastic colon polyp    Hypertension    IBS (irritable bowel syndrome)    Multiple thyroid nodules    Osteoporosis    T8 compression fx 03/2015    Personal history of radiation therapy    Presence of permanent cardiac pacemaker    Vitamin B12 deficiency    Past Surgical History:  Procedure Laterality Date   ANTERIOR CERVICAL DECOMP/DISCECTOMY FUSION  2008   C5-6   AUGMENTATION MAMMAPLASTY     BACK SURGERY     BREAST SURGERY     CARDIAC CATHETERIZATION  2001   EP IMPLANTABLE DEVICE N/A 05/05/2016   Procedure: Pacemaker Implant;  Surgeon: Evans Lance, MD;  Location: [place] [facility];  Service: Cardiovascular;  Laterality: N/A;   EXCISIONAL HEMORRHOIDECTOMY  1984   With subsequent correction of surgery   INSERT / REPLACE / REMOVE PACEMAKER     INTRAVASCULAR PRESSURE WIRE/FFR STUDY N/A 10/07/2019   Procedure: INTRAVASCULAR PRESSURE WIRE/FFR STUDY;  Surgeon:  Jettie Booze, MD;  Location: [place] [facility];  Service: Cardiovascular;  Laterality: N/A;   KNEE ARTHROSCOPY Left    "meniscus tear"   LEFT HEART CATH AND CORONARY ANGIOGRAPHY N/A 10/07/2019   Procedure: LEFT HEART CATH AND CORONARY ANGIOGRAPHY;  Surgeon: Jettie Booze, MD;  Location: [place] [facility];  Service: Cardiovascular;  Laterality: N/A;   MASTECTOMY Left 1978   PLACEMENT OF BREAST IMPLANTS Left 1981   REDUCTION MAMMAPLASTY Right    SHOULDER ARTHROSCOPY W/ ROTATOR CUFF REPAIR Left 02/2006   "tear"   TUBAL LIGATION     Social History   Socioeconomic History   Marital status: Married    Spouse name: Not on file   Number of children: 2   Years of education: Not on file   Highest education level: Not on file  Occupational History   Occupation: retired  Tobacco Use   Smoking status: Never   Smokeless tobacco: Never  Vaping Use   Vaping Use: Never used  Substance and Sexual Activity   Alcohol use: No    Alcohol/week: 0.0 standard drinks of alcohol   Drug use: No   Sexual activity: Not Currently  Other Topics Concern   Not on file  Social History Narrative   Housewife, Lives with spouse, 2 children   Social Determinants of Radio broadcast assistant  Strain: Low Risk  (04/04/2021)   Overall Financial Resource Strain (CARDIA)    Difficulty of Paying Living Expenses: Not hard at all  Food Insecurity: No Food Insecurity (04/04/2021)   Hunger Vital Sign    Worried About Running Out of Food in the Last Year: Never true    Ran Out of Food in the Last Year: Never true  Transportation Needs: No Transportation Needs (04/04/2021)   PRAPARE - Hydrologist (Medical): No    Lack of Transportation (Non-Medical): No  Physical Activity: Inactive (04/04/2021)   Exercise Vital Sign    Days of Exercise per Week: 0 days    Minutes of Exercise per Session: 0 min  Stress: No Stress Concern Present (04/04/2021)   [place]    Feeling of Stress : Not at all  Social Connections: Socially Integrated (04/04/2021)   Social Connection and Isolation Panel [NHANES]    Frequency of Communication with Friends and Family: More than three times a week    Frequency of Social Gatherings with Friends and Family: More than three times a week    Attends Religious Services: 1 to 4 times per year    Active Member of [employer] or Organizations: No    Attends Music therapist: 1 to 4 times per year    Marital Status: Married   Allergies  Allergen Reactions   Statins     Lipitor caused hospitalization - - depletion of electrolytes, fever, nausea, loss of appetite, couldn't get out of bed   Cymbalta [Duloxetine Hcl] Other (See Comments)    Dulled her too much, difficulty urinating, change in vision   Fosamax [Alendronate Sodium] Other (See Comments)    Caused chronic issues swallowing   Amitriptyline     hallucinations   Lyrica [Pregabalin]     Unknown   Neurontin [Gabapentin] Other (See Comments)    Dizziness and sedation (patient is tolerating in lower dose)   Zanaflex [Tizanidine] Other (See Comments)    Could not sleep   Family History  Problem Relation Age of Onset   Stroke Sister    Diabetes Brother    Breast cancer Maternal Aunt    Atrial fibrillation Sister    Colon cancer Neg Hx    Stomach cancer Neg Hx     Current Outpatient Medications (Endocrine & Metabolic):    denosumab (PROLIA) 60 MG/ML SOSY injection, Inject 60 mg into the skin every 6 (six) months.  Current Outpatient Medications (Cardiovascular):    ezetimibe (ZETIA) 10 MG tablet, TAKE ONE TABLET BY MOUTH EVERY MORNING   metoprolol succinate (TOPROL XL) 25 MG 24 hr tablet, Take 0.5 tablets (12.5 mg total) by mouth daily.   torsemide (DEMADEX) 5 MG tablet, Take 5 mg by mouth daily.   Current Outpatient Medications (Analgesics):    acetaminophen (TYLENOL) 500 MG tablet, Take 500 mg by mouth 2  (two) times daily.  Current Outpatient Medications (Hematological):    Cyanocobalamin 1000 MCG TBCR, Take 1,000 mcg by mouth daily.    ferrous sulfate 325 (65 FE) MG tablet, Take 325 mg by mouth 3 (three) times a week.  Current Outpatient Medications (Other):    busPIRone (BUSPAR) 10 MG tablet, Take 1 tablet (10 mg total) by mouth 3 (three) times daily as needed. (Patient taking differently: Take 10 mg by mouth 3 (three) times daily as needed (anxiety).)   cholecalciferol (VITAMIN D) 1000 UNITS tablet, Take 2,000 Units by mouth  daily.    diclofenac Sodium (VOLTAREN) 1 % GEL, Apply 2 g topically 4 (four) times daily.   escitalopram (LEXAPRO) 10 MG tablet, Take one tablet daily   Misc. Devices (WHEELCHAIR) MISC, Power Wheelchair   omeprazole (PRILOSEC) 40 MG capsule, TAKE 1 CAPSULE BY MOUTH TWICE A DAY FOR GERD.   Polyethyl Glycol-Propyl Glycol (LUBRICANT EYE DROPS) 0.4-0.3 % SOLN, Place 1 drop into both eyes 3 (three) times daily as needed (dry/irritated eyes.).   polyethylene glycol (MIRALAX / GLYCOLAX) packet, Take 17 g by mouth daily as needed (constipation). (Patient not taking: Reported on 03/12/2022)   Reviewed prior external information including notes and imaging from  primary care provider As well as notes that were available from care everywhere and other healthcare systems.  Past medical history, social, surgical and family history all reviewed in electronic medical record.  No pertanent information unless stated regarding to the chief complaint.   Review of Systems:  No headache, visual changes, nausea, vomiting, diarrhea, constipation, dizziness, abdominal pain, skin rash, fevers, chills, night sweats, weight loss, swollen lymph nodes, body aches, joint swelling, chest pain, shortness of breath, mood changes. POSITIVE muscle aches  Objective  There were no vitals taken for this visit.   General: No apparent distress alert and oriented x3 mood and affect normal, dressed  appropriately.  HEENT: Pupils equal, extraocular movements intact  Respiratory: Patient's speak in full sentences and does not appear short of breath  Cardiovascular: No lower extremity edema, non tender, no erythema      Impression and Recommendations:

## 2022-04-16 ENCOUNTER — Ambulatory Visit: Payer: Medicare Other | Admitting: Family Medicine

## 2022-04-16 DIAGNOSIS — Z9181 History of falling: Secondary | ICD-10-CM | POA: Diagnosis not present

## 2022-04-16 DIAGNOSIS — N189 Chronic kidney disease, unspecified: Secondary | ICD-10-CM | POA: Diagnosis not present

## 2022-04-16 DIAGNOSIS — K581 Irritable bowel syndrome with constipation: Secondary | ICD-10-CM | POA: Diagnosis not present

## 2022-04-16 DIAGNOSIS — G20 Parkinson's disease: Secondary | ICD-10-CM | POA: Diagnosis not present

## 2022-04-16 DIAGNOSIS — I130 Hypertensive heart and chronic kidney disease with heart failure and stage 1 through stage 4 chronic kidney disease, or unspecified chronic kidney disease: Secondary | ICD-10-CM | POA: Diagnosis not present

## 2022-04-16 DIAGNOSIS — Z950 Presence of cardiac pacemaker: Secondary | ICD-10-CM | POA: Diagnosis not present

## 2022-04-16 DIAGNOSIS — F419 Anxiety disorder, unspecified: Secondary | ICD-10-CM | POA: Diagnosis not present

## 2022-04-16 DIAGNOSIS — C7642 Malignant neoplasm of left upper limb: Secondary | ICD-10-CM | POA: Diagnosis not present

## 2022-04-16 DIAGNOSIS — Z483 Aftercare following surgery for neoplasm: Secondary | ICD-10-CM | POA: Diagnosis not present

## 2022-04-16 DIAGNOSIS — I5030 Unspecified diastolic (congestive) heart failure: Secondary | ICD-10-CM | POA: Diagnosis not present

## 2022-04-17 DIAGNOSIS — S41112A Laceration without foreign body of left upper arm, initial encounter: Secondary | ICD-10-CM | POA: Diagnosis not present

## 2022-04-18 DIAGNOSIS — C7642 Malignant neoplasm of left upper limb: Secondary | ICD-10-CM | POA: Diagnosis not present

## 2022-04-18 DIAGNOSIS — Z483 Aftercare following surgery for neoplasm: Secondary | ICD-10-CM | POA: Diagnosis not present

## 2022-04-18 DIAGNOSIS — I130 Hypertensive heart and chronic kidney disease with heart failure and stage 1 through stage 4 chronic kidney disease, or unspecified chronic kidney disease: Secondary | ICD-10-CM | POA: Diagnosis not present

## 2022-04-18 DIAGNOSIS — N189 Chronic kidney disease, unspecified: Secondary | ICD-10-CM | POA: Diagnosis not present

## 2022-04-18 DIAGNOSIS — I5030 Unspecified diastolic (congestive) heart failure: Secondary | ICD-10-CM | POA: Diagnosis not present

## 2022-04-18 DIAGNOSIS — G20 Parkinson's disease: Secondary | ICD-10-CM | POA: Diagnosis not present

## 2022-04-22 DIAGNOSIS — N189 Chronic kidney disease, unspecified: Secondary | ICD-10-CM | POA: Diagnosis not present

## 2022-04-22 DIAGNOSIS — Z483 Aftercare following surgery for neoplasm: Secondary | ICD-10-CM | POA: Diagnosis not present

## 2022-04-22 DIAGNOSIS — I5030 Unspecified diastolic (congestive) heart failure: Secondary | ICD-10-CM | POA: Diagnosis not present

## 2022-04-22 DIAGNOSIS — I130 Hypertensive heart and chronic kidney disease with heart failure and stage 1 through stage 4 chronic kidney disease, or unspecified chronic kidney disease: Secondary | ICD-10-CM | POA: Diagnosis not present

## 2022-04-22 DIAGNOSIS — G20 Parkinson's disease: Secondary | ICD-10-CM | POA: Diagnosis not present

## 2022-04-22 DIAGNOSIS — C7642 Malignant neoplasm of left upper limb: Secondary | ICD-10-CM | POA: Diagnosis not present

## 2022-04-24 ENCOUNTER — Telehealth: Payer: Self-pay

## 2022-04-24 DIAGNOSIS — Z483 Aftercare following surgery for neoplasm: Secondary | ICD-10-CM | POA: Diagnosis not present

## 2022-04-24 DIAGNOSIS — C7642 Malignant neoplasm of left upper limb: Secondary | ICD-10-CM | POA: Diagnosis not present

## 2022-04-24 DIAGNOSIS — I130 Hypertensive heart and chronic kidney disease with heart failure and stage 1 through stage 4 chronic kidney disease, or unspecified chronic kidney disease: Secondary | ICD-10-CM | POA: Diagnosis not present

## 2022-04-24 DIAGNOSIS — G20 Parkinson's disease: Secondary | ICD-10-CM | POA: Diagnosis not present

## 2022-04-24 DIAGNOSIS — N189 Chronic kidney disease, unspecified: Secondary | ICD-10-CM | POA: Diagnosis not present

## 2022-04-24 DIAGNOSIS — I5030 Unspecified diastolic (congestive) heart failure: Secondary | ICD-10-CM | POA: Diagnosis not present

## 2022-04-24 NOTE — Telephone Encounter (Signed)
Spoke with patient and informed her that she can use a heating pad on the back of her neck BUT, it must stay 6 inches away from the location of her device.  In addition, she needs to be sure that she places a cloth between the pad and her skin and do not have too hot to avoid burns.   Patient verbalizes understanding and thanks me for the call.

## 2022-04-24 NOTE — Telephone Encounter (Signed)
The patient wants to know if it is okay for her to use a neck heating pad? I told her the nurse will have to research it and give her a call back.

## 2022-04-29 DIAGNOSIS — G20 Parkinson's disease: Secondary | ICD-10-CM | POA: Diagnosis not present

## 2022-04-29 DIAGNOSIS — N189 Chronic kidney disease, unspecified: Secondary | ICD-10-CM | POA: Diagnosis not present

## 2022-04-29 DIAGNOSIS — I130 Hypertensive heart and chronic kidney disease with heart failure and stage 1 through stage 4 chronic kidney disease, or unspecified chronic kidney disease: Secondary | ICD-10-CM | POA: Diagnosis not present

## 2022-04-29 DIAGNOSIS — I5030 Unspecified diastolic (congestive) heart failure: Secondary | ICD-10-CM | POA: Diagnosis not present

## 2022-04-29 DIAGNOSIS — Z483 Aftercare following surgery for neoplasm: Secondary | ICD-10-CM | POA: Diagnosis not present

## 2022-04-29 DIAGNOSIS — C7642 Malignant neoplasm of left upper limb: Secondary | ICD-10-CM | POA: Diagnosis not present

## 2022-04-29 LAB — CUP PACEART REMOTE DEVICE CHECK
Battery Remaining Longevity: 102 mo
Battery Remaining Percentage: 98 %
Brady Statistic RA Percent Paced: 55 %
Brady Statistic RV Percent Paced: 0 %
Date Time Interrogation Session: 20231003030200
Implantable Lead Implant Date: 20171009
Implantable Lead Implant Date: 20171009
Implantable Lead Location: 753859
Implantable Lead Location: 753860
Implantable Lead Model: 5076
Implantable Lead Model: 7741
Implantable Lead Serial Number: 785430
Implantable Pulse Generator Implant Date: 20171009
Lead Channel Impedance Value: 1870 Ohm
Lead Channel Impedance Value: 400 Ohm
Lead Channel Setting Pacing Amplitude: 2 V
Lead Channel Setting Pacing Amplitude: 2.6 V
Lead Channel Setting Pacing Pulse Width: 1 ms
Lead Channel Setting Sensing Sensitivity: 2.5 mV
Pulse Gen Serial Number: 766467

## 2022-04-30 ENCOUNTER — Ambulatory Visit (INDEPENDENT_AMBULATORY_CARE_PROVIDER_SITE_OTHER): Payer: Medicare Other

## 2022-04-30 DIAGNOSIS — I495 Sick sinus syndrome: Secondary | ICD-10-CM | POA: Diagnosis not present

## 2022-05-02 DIAGNOSIS — F411 Generalized anxiety disorder: Secondary | ICD-10-CM | POA: Diagnosis not present

## 2022-05-02 DIAGNOSIS — N189 Chronic kidney disease, unspecified: Secondary | ICD-10-CM | POA: Diagnosis not present

## 2022-05-02 DIAGNOSIS — Z483 Aftercare following surgery for neoplasm: Secondary | ICD-10-CM | POA: Diagnosis not present

## 2022-05-02 DIAGNOSIS — C7642 Malignant neoplasm of left upper limb: Secondary | ICD-10-CM | POA: Diagnosis not present

## 2022-05-02 DIAGNOSIS — I5030 Unspecified diastolic (congestive) heart failure: Secondary | ICD-10-CM | POA: Diagnosis not present

## 2022-05-02 DIAGNOSIS — I130 Hypertensive heart and chronic kidney disease with heart failure and stage 1 through stage 4 chronic kidney disease, or unspecified chronic kidney disease: Secondary | ICD-10-CM | POA: Diagnosis not present

## 2022-05-02 DIAGNOSIS — G20 Parkinson's disease: Secondary | ICD-10-CM | POA: Diagnosis not present

## 2022-05-02 DIAGNOSIS — F32A Depression, unspecified: Secondary | ICD-10-CM | POA: Diagnosis not present

## 2022-05-05 DIAGNOSIS — N189 Chronic kidney disease, unspecified: Secondary | ICD-10-CM | POA: Diagnosis not present

## 2022-05-05 DIAGNOSIS — Z483 Aftercare following surgery for neoplasm: Secondary | ICD-10-CM | POA: Diagnosis not present

## 2022-05-05 DIAGNOSIS — I5030 Unspecified diastolic (congestive) heart failure: Secondary | ICD-10-CM | POA: Diagnosis not present

## 2022-05-05 DIAGNOSIS — G20 Parkinson's disease: Secondary | ICD-10-CM | POA: Diagnosis not present

## 2022-05-05 DIAGNOSIS — I130 Hypertensive heart and chronic kidney disease with heart failure and stage 1 through stage 4 chronic kidney disease, or unspecified chronic kidney disease: Secondary | ICD-10-CM | POA: Diagnosis not present

## 2022-05-05 DIAGNOSIS — C7642 Malignant neoplasm of left upper limb: Secondary | ICD-10-CM | POA: Diagnosis not present

## 2022-05-09 DIAGNOSIS — G20 Parkinson's disease: Secondary | ICD-10-CM | POA: Diagnosis not present

## 2022-05-09 DIAGNOSIS — I130 Hypertensive heart and chronic kidney disease with heart failure and stage 1 through stage 4 chronic kidney disease, or unspecified chronic kidney disease: Secondary | ICD-10-CM | POA: Diagnosis not present

## 2022-05-09 DIAGNOSIS — N189 Chronic kidney disease, unspecified: Secondary | ICD-10-CM | POA: Diagnosis not present

## 2022-05-09 DIAGNOSIS — I5030 Unspecified diastolic (congestive) heart failure: Secondary | ICD-10-CM | POA: Diagnosis not present

## 2022-05-09 DIAGNOSIS — Z483 Aftercare following surgery for neoplasm: Secondary | ICD-10-CM | POA: Diagnosis not present

## 2022-05-09 DIAGNOSIS — C7642 Malignant neoplasm of left upper limb: Secondary | ICD-10-CM | POA: Diagnosis not present

## 2022-05-09 NOTE — Progress Notes (Signed)
Remote pacemaker transmission.   

## 2022-05-12 DIAGNOSIS — G20 Parkinson's disease: Secondary | ICD-10-CM | POA: Diagnosis not present

## 2022-05-12 DIAGNOSIS — Z483 Aftercare following surgery for neoplasm: Secondary | ICD-10-CM | POA: Diagnosis not present

## 2022-05-12 DIAGNOSIS — C7642 Malignant neoplasm of left upper limb: Secondary | ICD-10-CM | POA: Diagnosis not present

## 2022-05-12 DIAGNOSIS — N189 Chronic kidney disease, unspecified: Secondary | ICD-10-CM | POA: Diagnosis not present

## 2022-05-12 DIAGNOSIS — I130 Hypertensive heart and chronic kidney disease with heart failure and stage 1 through stage 4 chronic kidney disease, or unspecified chronic kidney disease: Secondary | ICD-10-CM | POA: Diagnosis not present

## 2022-05-12 DIAGNOSIS — I5030 Unspecified diastolic (congestive) heart failure: Secondary | ICD-10-CM | POA: Diagnosis not present

## 2022-05-15 ENCOUNTER — Ambulatory Visit: Payer: Medicare Other | Admitting: Family Medicine

## 2022-05-17 ENCOUNTER — Inpatient Hospital Stay (HOSPITAL_COMMUNITY)
Admission: EM | Admit: 2022-05-17 | Discharge: 2022-05-19 | DRG: 552 | Disposition: A | Discharge status: Home Health | Payer: Medicare Other | Attending: Student | Admitting: Student

## 2022-05-17 ENCOUNTER — Other Ambulatory Visit: Payer: Self-pay

## 2022-05-17 ENCOUNTER — Emergency Department (HOSPITAL_COMMUNITY): Payer: Medicare Other

## 2022-05-17 ENCOUNTER — Encounter (HOSPITAL_COMMUNITY): Payer: Self-pay

## 2022-05-17 DIAGNOSIS — S12690A Other displaced fracture of seventh cervical vertebra, initial encounter for closed fracture: Secondary | ICD-10-CM | POA: Diagnosis present

## 2022-05-17 DIAGNOSIS — F419 Anxiety disorder, unspecified: Secondary | ICD-10-CM | POA: Diagnosis present

## 2022-05-17 DIAGNOSIS — M549 Dorsalgia, unspecified: Secondary | ICD-10-CM | POA: Diagnosis not present

## 2022-05-17 DIAGNOSIS — Z9851 Tubal ligation status: Secondary | ICD-10-CM

## 2022-05-17 DIAGNOSIS — N1831 Chronic kidney disease, stage 3a: Secondary | ICD-10-CM | POA: Diagnosis not present

## 2022-05-17 DIAGNOSIS — M50323 Other cervical disc degeneration at C6-C7 level: Secondary | ICD-10-CM | POA: Diagnosis not present

## 2022-05-17 DIAGNOSIS — K219 Gastro-esophageal reflux disease without esophagitis: Secondary | ICD-10-CM | POA: Diagnosis present

## 2022-05-17 DIAGNOSIS — S22071A Stable burst fracture of T9-T10 vertebra, initial encounter for closed fracture: Secondary | ICD-10-CM | POA: Diagnosis present

## 2022-05-17 DIAGNOSIS — E538 Deficiency of other specified B group vitamins: Secondary | ICD-10-CM | POA: Diagnosis present

## 2022-05-17 DIAGNOSIS — Z7401 Bed confinement status: Secondary | ICD-10-CM | POA: Diagnosis not present

## 2022-05-17 DIAGNOSIS — I5030 Unspecified diastolic (congestive) heart failure: Secondary | ICD-10-CM | POA: Diagnosis not present

## 2022-05-17 DIAGNOSIS — M40204 Unspecified kyphosis, thoracic region: Secondary | ICD-10-CM | POA: Diagnosis not present

## 2022-05-17 DIAGNOSIS — Z8719 Personal history of other diseases of the digestive system: Secondary | ICD-10-CM

## 2022-05-17 DIAGNOSIS — R079 Chest pain, unspecified: Secondary | ICD-10-CM | POA: Diagnosis not present

## 2022-05-17 DIAGNOSIS — Z833 Family history of diabetes mellitus: Secondary | ICD-10-CM

## 2022-05-17 DIAGNOSIS — G20A1 Parkinson's disease without dyskinesia, without mention of fluctuations: Secondary | ICD-10-CM | POA: Diagnosis present

## 2022-05-17 DIAGNOSIS — Z950 Presence of cardiac pacemaker: Secondary | ICD-10-CM

## 2022-05-17 DIAGNOSIS — R2689 Other abnormalities of gait and mobility: Secondary | ICD-10-CM | POA: Diagnosis present

## 2022-05-17 DIAGNOSIS — N1832 Chronic kidney disease, stage 3b: Secondary | ICD-10-CM | POA: Diagnosis not present

## 2022-05-17 DIAGNOSIS — N189 Chronic kidney disease, unspecified: Secondary | ICD-10-CM | POA: Diagnosis present

## 2022-05-17 DIAGNOSIS — W050XXA Fall from non-moving wheelchair, initial encounter: Secondary | ICD-10-CM | POA: Diagnosis present

## 2022-05-17 DIAGNOSIS — Z803 Family history of malignant neoplasm of breast: Secondary | ICD-10-CM

## 2022-05-17 DIAGNOSIS — I495 Sick sinus syndrome: Secondary | ICD-10-CM | POA: Diagnosis present

## 2022-05-17 DIAGNOSIS — S2241XA Multiple fractures of ribs, right side, initial encounter for closed fracture: Secondary | ICD-10-CM | POA: Diagnosis present

## 2022-05-17 DIAGNOSIS — Z043 Encounter for examination and observation following other accident: Secondary | ICD-10-CM | POA: Diagnosis not present

## 2022-05-17 DIAGNOSIS — S22061A Stable burst fracture of T7-T8 vertebra, initial encounter for closed fracture: Secondary | ICD-10-CM | POA: Diagnosis present

## 2022-05-17 DIAGNOSIS — M7989 Other specified soft tissue disorders: Secondary | ICD-10-CM | POA: Diagnosis not present

## 2022-05-17 DIAGNOSIS — S22000A Wedge compression fracture of unspecified thoracic vertebra, initial encounter for closed fracture: Principal | ICD-10-CM

## 2022-05-17 DIAGNOSIS — E785 Hyperlipidemia, unspecified: Secondary | ICD-10-CM | POA: Diagnosis present

## 2022-05-17 DIAGNOSIS — E782 Mixed hyperlipidemia: Secondary | ICD-10-CM | POA: Diagnosis not present

## 2022-05-17 DIAGNOSIS — Z9012 Acquired absence of left breast and nipple: Secondary | ICD-10-CM

## 2022-05-17 DIAGNOSIS — Z79899 Other long term (current) drug therapy: Secondary | ICD-10-CM

## 2022-05-17 DIAGNOSIS — S0103XA Puncture wound without foreign body of scalp, initial encounter: Secondary | ICD-10-CM | POA: Diagnosis present

## 2022-05-17 DIAGNOSIS — S51011A Laceration without foreign body of right elbow, initial encounter: Secondary | ICD-10-CM | POA: Diagnosis not present

## 2022-05-17 DIAGNOSIS — S22020A Wedge compression fracture of second thoracic vertebra, initial encounter for closed fracture: Principal | ICD-10-CM | POA: Diagnosis present

## 2022-05-17 DIAGNOSIS — Y92121 Bathroom in nursing home as the place of occurrence of the external cause: Secondary | ICD-10-CM | POA: Diagnosis not present

## 2022-05-17 DIAGNOSIS — S22040A Wedge compression fracture of fourth thoracic vertebra, initial encounter for closed fracture: Secondary | ICD-10-CM | POA: Diagnosis present

## 2022-05-17 DIAGNOSIS — S0990XA Unspecified injury of head, initial encounter: Secondary | ICD-10-CM | POA: Diagnosis not present

## 2022-05-17 DIAGNOSIS — Z8249 Family history of ischemic heart disease and other diseases of the circulatory system: Secondary | ICD-10-CM

## 2022-05-17 DIAGNOSIS — S22009A Unspecified fracture of unspecified thoracic vertebra, initial encounter for closed fracture: Secondary | ICD-10-CM | POA: Diagnosis not present

## 2022-05-17 DIAGNOSIS — R102 Pelvic and perineal pain: Secondary | ICD-10-CM | POA: Diagnosis not present

## 2022-05-17 DIAGNOSIS — Z886 Allergy status to analgesic agent status: Secondary | ICD-10-CM | POA: Diagnosis not present

## 2022-05-17 DIAGNOSIS — Z823 Family history of stroke: Secondary | ICD-10-CM

## 2022-05-17 DIAGNOSIS — R531 Weakness: Secondary | ICD-10-CM | POA: Diagnosis not present

## 2022-05-17 DIAGNOSIS — I130 Hypertensive heart and chronic kidney disease with heart failure and stage 1 through stage 4 chronic kidney disease, or unspecified chronic kidney disease: Secondary | ICD-10-CM | POA: Diagnosis present

## 2022-05-17 DIAGNOSIS — S2249XA Multiple fractures of ribs, unspecified side, initial encounter for closed fracture: Secondary | ICD-10-CM

## 2022-05-17 DIAGNOSIS — Z862 Personal history of diseases of the blood and blood-forming organs and certain disorders involving the immune mechanism: Secondary | ICD-10-CM

## 2022-05-17 DIAGNOSIS — Z923 Personal history of irradiation: Secondary | ICD-10-CM | POA: Diagnosis not present

## 2022-05-17 DIAGNOSIS — Z993 Dependence on wheelchair: Secondary | ICD-10-CM | POA: Diagnosis not present

## 2022-05-17 DIAGNOSIS — Z981 Arthrodesis status: Secondary | ICD-10-CM

## 2022-05-17 DIAGNOSIS — M546 Pain in thoracic spine: Secondary | ICD-10-CM | POA: Diagnosis not present

## 2022-05-17 DIAGNOSIS — Z853 Personal history of malignant neoplasm of breast: Secondary | ICD-10-CM | POA: Diagnosis not present

## 2022-05-17 DIAGNOSIS — I5032 Chronic diastolic (congestive) heart failure: Secondary | ICD-10-CM | POA: Diagnosis present

## 2022-05-17 DIAGNOSIS — Z9882 Breast implant status: Secondary | ICD-10-CM

## 2022-05-17 DIAGNOSIS — Z888 Allergy status to other drugs, medicaments and biological substances status: Secondary | ICD-10-CM | POA: Diagnosis not present

## 2022-05-17 DIAGNOSIS — W19XXXA Unspecified fall, initial encounter: Secondary | ICD-10-CM | POA: Diagnosis present

## 2022-05-17 DIAGNOSIS — Z743 Need for continuous supervision: Secondary | ICD-10-CM | POA: Diagnosis not present

## 2022-05-17 DIAGNOSIS — E7800 Pure hypercholesterolemia, unspecified: Secondary | ICD-10-CM | POA: Diagnosis present

## 2022-05-17 DIAGNOSIS — M810 Age-related osteoporosis without current pathological fracture: Secondary | ICD-10-CM | POA: Diagnosis present

## 2022-05-17 DIAGNOSIS — I959 Hypotension, unspecified: Secondary | ICD-10-CM | POA: Diagnosis not present

## 2022-05-17 LAB — BASIC METABOLIC PANEL
Anion gap: 9 (ref 5–15)
BUN: 19 mg/dL (ref 8–23)
CO2: 25 mmol/L (ref 22–32)
Calcium: 9.3 mg/dL (ref 8.9–10.3)
Chloride: 105 mmol/L (ref 98–111)
Creatinine, Ser: 1.59 mg/dL — ABNORMAL HIGH (ref 0.44–1.00)
GFR, Estimated: 32 mL/min — ABNORMAL LOW (ref >60)
Glucose, Bld: 92 mg/dL (ref 70–99)
Potassium: 3.9 mmol/L (ref 3.5–5.1)
Sodium: 139 mmol/L (ref 135–145)

## 2022-05-17 LAB — CBC WITH DIFFERENTIAL/PLATELET
Abs Immature Granulocytes: 0.1 10*3/uL — ABNORMAL HIGH (ref 0.00–0.07)
Basophils Absolute: 0.1 10*3/uL (ref 0.0–0.1)
Basophils Relative: 0 %
Eosinophils Absolute: 0.2 10*3/uL (ref 0.0–0.5)
Eosinophils Relative: 1 %
HCT: 34.5 % — ABNORMAL LOW (ref 36.0–46.0)
Hemoglobin: 11.4 g/dL — ABNORMAL LOW (ref 12.0–15.0)
Immature Granulocytes: 1 %
Lymphocytes Relative: 6 %
Lymphs Abs: 0.9 10*3/uL (ref 0.7–4.0)
MCH: 33.7 pg (ref 26.0–34.0)
MCHC: 33 g/dL (ref 30.0–36.0)
MCV: 102.1 fL — ABNORMAL HIGH (ref 80.0–100.0)
Monocytes Absolute: 0.9 10*3/uL (ref 0.1–1.0)
Monocytes Relative: 7 %
Neutro Abs: 11.7 10*3/uL — ABNORMAL HIGH (ref 1.7–7.7)
Neutrophils Relative %: 85 %
Platelets: 299 10*3/uL (ref 150–400)
RBC: 3.38 MIL/uL — ABNORMAL LOW (ref 3.87–5.11)
RDW: 12.8 % (ref 11.5–15.5)
WBC: 13.9 10*3/uL — ABNORMAL HIGH (ref 4.0–10.5)
nRBC: 0 % (ref 0.0–0.2)

## 2022-05-17 MED ORDER — OXYCODONE HCL 5 MG PO TABS
5.0000 mg | ORAL_TABLET | Freq: Once | ORAL | Status: AC
  Administered 2022-05-17: 5 mg via ORAL
  Filled 2022-05-17: qty 1

## 2022-05-17 MED ORDER — FERROUS SULFATE 325 (65 FE) MG PO TABS
325.0000 mg | ORAL_TABLET | ORAL | Status: DC
  Administered 2022-05-19: 325 mg via ORAL
  Filled 2022-05-17: qty 1

## 2022-05-17 MED ORDER — ACETAMINOPHEN 325 MG PO TABS
650.0000 mg | ORAL_TABLET | Freq: Four times a day (QID) | ORAL | Status: DC | PRN
  Administered 2022-05-17: 650 mg via ORAL
  Filled 2022-05-17: qty 2

## 2022-05-17 MED ORDER — MORPHINE SULFATE (PF) 2 MG/ML IV SOLN: 2.0000 mg | INTRAVENOUS | Status: DC | PRN

## 2022-05-17 MED ORDER — PANTOPRAZOLE SODIUM 40 MG PO TBEC
40.0000 mg | DELAYED_RELEASE_TABLET | Freq: Every day | ORAL | Status: DC
  Administered 2022-05-18 – 2022-05-19 (×2): 40 mg via ORAL
  Filled 2022-05-17 (×2): qty 1

## 2022-05-17 MED ORDER — ESCITALOPRAM OXALATE 10 MG PO TABS
10.0000 mg | ORAL_TABLET | Freq: Every day | ORAL | Status: DC
  Administered 2022-05-18 – 2022-05-19 (×2): 10 mg via ORAL
  Filled 2022-05-17 (×2): qty 1

## 2022-05-17 MED ORDER — TORSEMIDE 10 MG PO TABS: 5.0000 mg | ORAL_TABLET | Freq: Every day | ORAL | Status: DC

## 2022-05-17 MED ORDER — ACETAMINOPHEN 650 MG RE SUPP: 650.0000 mg | Freq: Four times a day (QID) | RECTAL | Status: DC | PRN

## 2022-05-17 MED ORDER — METOPROLOL SUCCINATE ER 25 MG PO TB24: 12.5000 mg | ORAL_TABLET | Freq: Every day | ORAL | Status: DC

## 2022-05-17 MED ORDER — BUSPIRONE HCL 10 MG PO TABS: 10.0000 mg | ORAL_TABLET | Freq: Three times a day (TID) | ORAL | Status: DC | PRN

## 2022-05-17 MED ORDER — OXYCODONE HCL 5 MG PO TABS
5.0000 mg | ORAL_TABLET | ORAL | Status: DC | PRN
  Administered 2022-05-17 – 2022-05-18 (×4): 5 mg via ORAL
  Filled 2022-05-17 (×5): qty 1

## 2022-05-17 MED ORDER — EZETIMIBE 10 MG PO TABS
10.0000 mg | ORAL_TABLET | Freq: Every morning | ORAL | Status: DC
  Administered 2022-05-18 – 2022-05-19 (×2): 10 mg via ORAL
  Filled 2022-05-17 (×2): qty 1

## 2022-05-17 MED ORDER — POLYETHYLENE GLYCOL 3350 17 G PO PACK: 17.0000 g | PACK | Freq: Every day | ORAL | Status: DC | PRN

## 2022-05-17 NOTE — ED Provider Notes (Signed)
[facility] DEPT Provider Note   CSN: [id number] Arrival date & time: 05/17/22  1200     History  Chief Complaint  Patient presents with   Fall    [name] is a 87 y.o. female.  Patient here after mechanical fall independent living facility.  Patient was pushing her wheelchair when she was going too fast and fell and hit the right side of her head and right side of her body on the wall.  She has small abrasion to her right side of her head as well as her right elbow per EMS.  She is not on blood thinners.  She is having some upper back pain as well.  Denies any hip pain or chest wall pain.  Nothing makes it worse or better.  Tetanus shot is up-to-date.  Denies any numbness, tingling, loss of consciousness, weakness.  The history is provided by the patient.       Home Medications Prior to Admission medications   Medication Sig Start Date End Date Taking? Authorizing Provider  acetaminophen (TYLENOL) 500 MG tablet Take 500 mg by mouth 2 (two) times daily.    [provider]  busPIRone (BUSPAR) 10 MG tablet Take 1 tablet (10 mg total) by mouth 3 (three) times daily as needed. Patient taking differently: Take 10 mg by mouth 3 (three) times daily as needed (anxiety). 01/18/21   Biagio Borg, MD  cholecalciferol (VITAMIN D) 1000 UNITS tablet Take 2,000 Units by mouth daily.     [provider]  Cyanocobalamin 1000 MCG TBCR Take 1,000 mcg by mouth daily.     [provider]  denosumab (PROLIA) 60 MG/ML SOSY injection Inject 60 mg into the skin every 6 (six) months.    [provider]  diclofenac Sodium (VOLTAREN) 1 % GEL Apply 2 g topically 4 (four) times daily.    [provider]  escitalopram (LEXAPRO) 10 MG tablet Take one tablet daily 02/25/22   Tat, Eustace Quail, DO  ezetimibe (ZETIA) 10 MG tablet TAKE ONE TABLET BY MOUTH EVERY MORNING 04/04/21   [name], MD  ferrous sulfate 325 (65 FE) MG tablet  Take 325 mg by mouth 3 (three) times a week.    [provider]  metoprolol succinate (TOPROL XL) 25 MG 24 hr tablet Take 0.5 tablets (12.5 mg total) by mouth daily. 12/19/20   [name], MD  Misc. Devices [facility]) [employer] Wheelchair 03/28/21   Tat, Eustace Quail, DO  omeprazole (PRILOSEC) 40 MG capsule TAKE 1 CAPSULE BY MOUTH TWICE A DAY FOR GERD. 05/07/21   Burns, Claudina Lick, MD  Polyethyl Glycol-Propyl Glycol (LUBRICANT EYE DROPS) 0.4-0.3 % SOLN Place 1 drop into both eyes 3 (three) times daily as needed (dry/irritated eyes.).    [provider]  polyethylene glycol (MIRALAX / GLYCOLAX) packet Take 17 g by mouth daily as needed (constipation). Patient not taking: Reported on 03/12/2022    [provider]  torsemide (DEMADEX) 5 MG tablet Take 5 mg by mouth daily.    [provider]      Allergies    Statins, Cymbalta [duloxetine hcl], Fosamax [alendronate sodium], Amitriptyline, Lyrica [pregabalin], Neurontin [gabapentin], and Zanaflex [tizanidine]    Review of Systems   Review of Systems  Physical Exam Updated Vital Signs BP 131/64 (BP Location: Left Arm)   Pulse 63   Temp 98 F (36.7 C) (Oral)   Resp 18   Ht 5' (1.524 m)   Wt  63.5 kg   SpO2 98%   BMI 27.34 kg/m  Physical Exam Vitals and nursing note reviewed.  Constitutional:      General: She is not in acute distress.    Appearance: She is well-developed. She is not ill-appearing.  HENT:     Head:     Comments: Small puncture wound that is hemostatic to the right side of the scalp    Nose: Nose normal.     Mouth/Throat:     Mouth: Mucous membranes are moist.  Eyes:     Extraocular Movements: Extraocular movements intact.     Conjunctiva/sclera: Conjunctivae normal.     Pupils: Pupils are equal, round, and reactive to light.  Cardiovascular:     Rate and Rhythm: Normal rate and regular rhythm.     Pulses: Normal pulses.     Heart sounds: Normal heart sounds. No murmur  heard. Pulmonary:     Effort: Pulmonary effort is normal. No respiratory distress.     Breath sounds: Normal breath sounds.  Abdominal:     General: Abdomen is flat.     Palpations: Abdomen is soft.     Tenderness: There is no abdominal tenderness.  Musculoskeletal:        General: No swelling or tenderness. Normal range of motion.     Cervical back: Normal range of motion and neck supple.  Skin:    General: Skin is warm and dry.     Capillary Refill: Capillary refill takes less than 2 seconds.     Comments: Skin tear to right elbow  Neurological:     General: No focal deficit present.     Mental Status: She is alert and oriented to person, place, and time.     Cranial Nerves: No cranial nerve deficit.     Sensory: No sensory deficit.     Motor: No weakness.     Coordination: Coordination normal.  Psychiatric:        Mood and Affect: Mood normal.     ED Results / Procedures / Treatments   Labs (all labs ordered are listed, but only abnormal results are displayed) Labs Reviewed  CBC WITH DIFFERENTIAL/PLATELET  BASIC METABOLIC PANEL    EKG None  Radiology DG Chest 2 View  Result Date: 05/17/2022 CLINICAL DATA:  Per EMS- patient is from [facility].Patient was hurrying to go to the bathroom and fell out of her wheelchair. Patient hit the right side of her head, a skin tear to the right elbow, and c/o pain mid right back EXAM: CHEST - 2 VIEW COMPARISON:  02/24/2022 FINDINGS: Lungs are clear. Heart size and mediastinal contours are within normal limits. 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 Mitral annulus calcifications. Aortic Atherosclerosis (ICD10-170.0). No effusion.  No pneumothorax. Cervical fixation hardware. Chronic T8 vertebral compression deformity. The subacute T2 and T4 fractures seen on today's CT are less conspicuous. IMPRESSION: No acute cardiopulmonary disease. Electronically Signed   By: Lucrezia Europe M.D.   On: 05/17/2022  14:34   DG Elbow Complete Right  Result Date: 05/17/2022 CLINICAL DATA:  [place], skin laceration, pain EXAM: RIGHT ELBOW - COMPLETE 3+ VIEW COMPARISON:  None Available. FINDINGS: There is no evidence of fracture, dislocation, or joint effusion. There is no evidence of arthropathy or other focal bone abnormality. There is dorsal soft tissue swelling at the level of the distal humerus. IMPRESSION: Dorsal soft tissue swelling. No fracture or other acute finding. Electronically Signed   By: Eden Emms.D.  On: 05/17/2022 14:22   CT Thoracic Spine Wo Contrast  Result Date: 05/17/2022 CLINICAL DATA:  Back pain after fall from wheelchair EXAM: CT THORACIC SPINE WITHOUT CONTRAST TECHNIQUE: Multidetector CT images of the thoracic were obtained using the standard protocol without intravenous contrast. RADIATION DOSE REDUCTION: This exam was performed according to the departmental dose-optimization program which includes automated exposure control, adjustment of the mA and/or kV according to patient size and/or use of iterative reconstruction technique. COMPARISON:  X-ray 02/03/2020.  CT 06/08/2017 FINDINGS: Alignment: Exaggerated thoracic kyphosis.  No traumatic listhesis. Vertebrae: Acute to subacute appearing mild superior endplate compression fractures of the T2 and T4 vertebral bodies within the thoracic spine as well as a mild inferior endplate compression fracture of C7 within the imaged lower cervical spine. Chronic severe inferior endplate compression fracture of T8 which is unchanged. Remaining thoracic vertebral body heights are maintained. Nondisplaced fracture through the right transverse processes of the T7, T8, T9, and T10 vertebrae (series 9, images 50 9-61). Nondisplaced fracture of the posterior right sixth rib. Nondisplaced fractures of the posterior right eighth and ninth ribs near the costovertebral junction (series 4, images 94 and 105). No lytic or sclerotic bone lesion. Paraspinal and other  soft tissues: Aortic and coronary artery atherosclerosis. No pleural effusion or pneumothorax. Nonobstructing stone within the left kidney. Disc levels: No significant spondylosis of the thoracic spine. IMPRESSION: 1. Acute-to-subacute appearing mild superior endplate compression fractures of the T2 and T4 vertebral bodies within the thoracic spine as well as a mild inferior endplate compression fracture of C7 within the imaged lower cervical spine. 2. Nondisplaced fracture through the right transverse processes of the T7, T8, T9, and T10 vertebrae. 3. Nondisplaced fractures of the posterior right sixth, eighth, and ninth ribs. 4. Chronic severe inferior endplate compression fracture of T8. 5. Aortic and coronary artery atherosclerosis (ICD10-I70.0). Electronically Signed   By: Davina Poke D.O.   On: 05/17/2022 14:10   CT Head Wo Contrast  Result Date: 05/17/2022 CLINICAL DATA:  Fall out of wheelchair. EXAM: CT HEAD WITHOUT CONTRAST CT CERVICAL SPINE WITHOUT CONTRAST TECHNIQUE: Multidetector CT imaging of the head and cervical spine was performed following the standard protocol without intravenous contrast. Multiplanar CT image reconstructions of the cervical spine were also generated. RADIATION DOSE REDUCTION: This exam was performed according to the departmental dose-optimization program which includes automated exposure control, adjustment of the mA and/or kV according to patient size and/or use of iterative reconstruction technique. COMPARISON:  February 24, 2021. FINDINGS: CT HEAD FINDINGS Brain: No evidence of acute infarction, hemorrhage, hydrocephalus, extra-axial collection or mass lesion/mass effect. Vascular: No hyperdense vessel or unexpected calcification. Skull: Normal. Negative for fracture or focal lesion. Sinuses/Orbits: No acute finding. Other: None. CT CERVICAL SPINE FINDINGS Alignment: Normal. Skull base and vertebrae: No acute fracture. No primary bone lesion or focal pathologic process.  Soft tissues and spinal canal: No prevertebral fluid or swelling. No visible canal hematoma. Disc levels: Status post surgical anterior fusion of C5-6. Moderate degenerative disc disease is noted at C6-7. Upper chest: Negative. Other: None. IMPRESSION: No acute intracranial abnormality seen. No acute abnormality seen in the cervical spine. Electronically Signed   By: Marijo Conception M.D.   On: 05/17/2022 14:03   CT Cervical Spine Wo Contrast  Result Date: 05/17/2022 CLINICAL DATA:  Fall out of wheelchair. EXAM: CT HEAD WITHOUT CONTRAST CT CERVICAL SPINE WITHOUT CONTRAST TECHNIQUE: Multidetector CT imaging of the head and cervical spine was performed following the standard protocol without intravenous contrast.  Multiplanar CT image reconstructions of the cervical spine were also generated. RADIATION DOSE REDUCTION: This exam was performed according to the departmental dose-optimization program which includes automated exposure control, adjustment of the mA and/or kV according to patient size and/or use of iterative reconstruction technique. COMPARISON:  February 24, 2021. FINDINGS: CT HEAD FINDINGS Brain: No evidence of acute infarction, hemorrhage, hydrocephalus, extra-axial collection or mass lesion/mass effect. Vascular: No hyperdense vessel or unexpected calcification. Skull: Normal. Negative for fracture or focal lesion. Sinuses/Orbits: No acute finding. Other: None. CT CERVICAL SPINE FINDINGS Alignment: Normal. Skull base and vertebrae: No acute fracture. No primary bone lesion or focal pathologic process. Soft tissues and spinal canal: No prevertebral fluid or swelling. No visible canal hematoma. Disc levels: Status post surgical anterior fusion of C5-6. Moderate degenerative disc disease is noted at C6-7. Upper chest: Negative. Other: None. IMPRESSION: No acute intracranial abnormality seen. No acute abnormality seen in the cervical spine. Electronically Signed   By: Marijo Conception M.D.   On: 05/17/2022  14:03    Procedures Procedures    Medications Ordered in ED Medications  oxyCODONE (Oxy IR/ROXICODONE) immediate release tablet 5 mg (has no administration in time range)    ED Course/ Medical Decision Making/ A&P                           Medical Decision Making Amount and/or Complexity of Data Reviewed Labs: ordered. Radiology: ordered.  Risk Prescription drug management. Decision regarding hospitalization.   [name] is here after mechanical fall at home.  Puncture wound to the right side of her scalp, skin tear to the right elbow.  She is complaining of upper back pain.  No lower extremity pain or upper extremity discomfort.  Normal vitals.  No fever.  She is on blood thinners.  Uses a wheelchair but she also uses it as a walker at times.  Wheelchair got away from her when she was pushing it and she fell.  Did not lose consciousness.  Has no midline spinal pain except for maybe some upper thoracic pain.  He has had compression fractures in the past.  She has a history of high cholesterol, heart failure, hypertension.  We will get a head and neck CT as well as thoracic CT of her spine.  We will get a chest x-ray and pelvic x-ray as well as right elbow x-ray.  Tetanus shot is up-to-date.  Per radiology report review patient has acute compression fracture of T2 and T4.  Transverse process fractures of right T7, T8, T9, T10.  Also has rib fractures of the posterior right sixth, eighth, ninth ribs.  She has no cervical spine injury or abnormalities on her head CT.  X-ray of her right elbow per my review and interpretation shows no fracture or malalignment.  Pelvic x-ray does not show any obvious pelvic fractures or hip fracture.  Overall given her baseline physical status and that she lives in an assisted living facility do not think it is safe for her to go home with these injuries as she is in a significant amount of pain.  I will place her in a TLSO brace.  I have ordered  incentive spirometry.  Will admit to medicine for further care.  Medical screening labs have been ordered.  While awaiting IV placement we will give her a dose of oxycodone.  This chart was dictated using voice recognition software.  Despite best efforts to proofread,  errors  can occur which can change the documentation meaning.         Final Clinical Impression(s) / ED Diagnoses Final diagnoses:  Compression fracture of body of thoracic vertebra (HCC)  Closed fracture of multiple ribs, unspecified laterality, initial encounter  Closed fracture of transverse process of thoracic vertebra, initial encounter [facility])    Rx / [place] Orders ED Discharge Orders     None         Lennice Sites, DO 05/17/22 1437

## 2022-05-17 NOTE — Plan of Care (Signed)
Discussed and reviewed plan of care with patient/famil

## 2022-05-17 NOTE — Progress Notes (Signed)
Orthopedic Tech Progress Note Patient Details:  [name] [birth year] [id number]  Ortho Devices Type of Ortho Device: Thoracolumbar corset (TLSO) Ortho Device/Splint Interventions: Application   Post Interventions Patient Tolerated: Well Instructions Provided: Care of device, Adjustment of device  [name] 05/17/2022, 4:04 PM

## 2022-05-17 NOTE — ED Triage Notes (Addendum)
Per EMS- patient is from [facility] side. Patient was hurrying to go to the bathroom and fell out of her wheelchair.  Patient hit the right side of her head, a skin tear to the right elbow, and c/o pain mid right back.  No blood thinners.  Patient stated she was pushing a wheelchair and bumped the wall which caused her to fall

## 2022-05-17 NOTE — H&P (Signed)
History and Physical    [name] QBV:[id number] DOB: [birth year] DOA: 05/17/2022  PCP: Pcp, No   Patient coming from: ALF    Chief Complaint: Fall  HPI: [name] is a 87 y.o. female with medical history significant of hypertension, diastolic congestive heart failure, Parkinson disease, hyperlipidemia, anxiety who presented from assisted living facility for the evaluation of fall.  Patient usually ambulates with the help of wheelchair/walker.  She was about to go to her bathroom for wheelchair but her wheelchair got away and see here in the wall and fell on her right side.  She got a small abrasion on the right side of her head and her right elbow and also complained of some upper back pain.  Patient was brought to the emergency department for further evaluation. Patient seen and examined at bedside in the emergency department.  She was on the hallway on stretcher.  During evaluation she was overall comfortable, hemodynamically stable and alert and oriented. She denies any chest pain, shortness of breath, abdomen pain, nausea, vomiting, diarrhea, hematochezia or melena.  ED Course: Hemodynamically stable on presentation.  Lab work not collected yet, pending.  CT head did not show any acute intracranial findings.  Chest x-ray did not show any pneumonia or pleural effusion.  Showed chronic T8 compression fracture.  Pelvic x-ray did not show any fracture.  CT thoracic spine showed acute-to-subacute appearing mild superior endplate compression fractures of the T2 and T4 vertebral bodies within the thoracic spine as well as a mild inferior endplate compression fracture of C7 within the imaged lower cervical spine.Nondisplaced fracture through the right transverse processes of the T7, T8, T9, and T10 vertebrae.Nondisplaced fractures of the posterior right sixth, eighth, and ninth ribs. TLSO brace ordered.  Patient admitted for the pain management and PT/OT evaluation  Review of  Systems: As per HPI otherwise 10 point review of systems negative.    Past Medical History:  Diagnosis Date   Allergy    Anemia    Cancer of left breast ([place]) 1978   CHF (congestive heart failure) ([place])    Chronic thoracic back pain    "T8; fracture; 03/2015; no OR" (05/05/2016)   GERD (gastroesophageal reflux disease)    Heart murmur    History of hiatal hernia    Hyperlipidemia    Hyperplastic colon polyp    Hypertension    IBS (irritable bowel syndrome)    Multiple thyroid nodules    Osteoporosis    T8 compression fx 03/2015    Personal history of radiation therapy    Presence of permanent cardiac pacemaker    Vitamin B12 deficiency     Past Surgical History:  Procedure Laterality Date   ANTERIOR CERVICAL DECOMP/DISCECTOMY FUSION  2008   C5-6   AUGMENTATION MAMMAPLASTY     BACK SURGERY     BREAST SURGERY     CARDIAC CATHETERIZATION  2001   EP IMPLANTABLE DEVICE N/A 05/05/2016   Procedure: Pacemaker Implant;  Surgeon: Evans Lance, MD;  Location: [facility];  Service: Cardiovascular;  Laterality: N/A;   EXCISIONAL HEMORRHOIDECTOMY  1984   With subsequent correction of surgery   INSERT / REPLACE / REMOVE PACEMAKER     INTRAVASCULAR PRESSURE WIRE/FFR STUDY N/A 10/07/2019   Procedure: INTRAVASCULAR PRESSURE WIRE/FFR STUDY;  Surgeon: Jettie Booze, MD;  Location: [facility];  Service: Cardiovascular;  Laterality: N/A;   KNEE ARTHROSCOPY Left    "meniscus tear"   LEFT HEART CATH AND  CORONARY ANGIOGRAPHY N/A 10/07/2019   Procedure: LEFT HEART CATH AND CORONARY ANGIOGRAPHY;  Surgeon: Jettie Booze, MD;  Location: [facility];  Service: Cardiovascular;  Laterality: N/A;   MASTECTOMY Left 1978   PLACEMENT OF BREAST IMPLANTS Left 1981   REDUCTION MAMMAPLASTY Right    SHOULDER ARTHROSCOPY W/ ROTATOR CUFF REPAIR Left 02/2006   "tear"   TUBAL LIGATION       reports that she has never smoked. She has never used smokeless tobacco. She reports  that she does not drink alcohol and does not use drugs.  Allergies  Allergen Reactions   Statins Other (See Comments)    Lipitor caused hospitalization - - depletion of electrolytes, fever, nausea, loss of appetite, couldn't get out of bed   Cymbalta [Duloxetine Hcl] Other (See Comments)    Dulled her too much, difficulty urinating, change in vision- "Allergic," per [facility]   Fosamax [Alendronate Sodium] Other (See Comments)    Caused chronic issues swallowing   Amitriptyline Other (See Comments)    Hallucinations    Fosamax [Alendronate] Other (See Comments)    "Allergic," per [facility]   Lyrica [Pregabalin] Other (See Comments)    "Allergic," per [name]   Ranitidine Other (See Comments)    "Allergic," per [name]   Neurontin [Gabapentin] Other (See Comments)    Dizziness and sedation (patient is tolerating in lower dose)- "Allergic," per [name]   Zanaflex [Tizanidine] Other (See Comments)    Could not sleep    Family History  Problem Relation Age of Onset   Stroke Sister    Diabetes Brother    Breast cancer Maternal Aunt    Atrial fibrillation Sister    Colon cancer Neg Hx    Stomach cancer Neg Hx      Prior to Admission medications   Medication Sig Start Date End Date Taking? Authorizing Provider  acetaminophen (TYLENOL) 500 MG tablet Take 500 mg by mouth 2 (two) times daily.    [provider]  busPIRone (BUSPAR) 10 MG tablet Take 1 tablet (10 mg total) by mouth 3 (three) times daily as needed. Patient taking differently: Take 10 mg by mouth 3 (three) times daily as needed (anxiety). 01/18/21   Biagio Borg, MD  cholecalciferol (VITAMIN D) 1000 UNITS tablet Take 2,000 Units by mouth daily.     [provider]  Cyanocobalamin 1000 MCG TBCR Take 1,000 mcg by mouth daily.     [provider]  denosumab (PROLIA) 60 MG/ML SOSY injection Inject 60 mg into the skin every 6 (six) months.    [provider]  diclofenac Sodium (VOLTAREN) 1 % GEL Apply 2 g  topically 4 (four) times daily.    [provider]  escitalopram (LEXAPRO) 10 MG tablet Take one tablet daily 02/25/22   Tat, Eustace Quail, DO  ezetimibe (ZETIA) 10 MG tablet TAKE ONE TABLET BY MOUTH EVERY MORNING 04/04/21   [name], MD  ferrous sulfate 325 (65 FE) MG tablet Take 325 mg by mouth 3 (three) times a week.    [provider]  metoprolol succinate (TOPROL XL) 25 MG 24 hr tablet Take 0.5 tablets (12.5 mg total) by mouth daily. 12/19/20   [name], MD  Misc. Devices [facility]) [place] Power Wheelchair 03/28/21   Tat, Eustace Quail, DO  omeprazole (PRILOSEC) 40 MG capsule TAKE 1 CAPSULE BY MOUTH TWICE A DAY FOR GERD. 05/07/21   [place], Claudina Lick, MD  Polyethyl Glycol-Propyl Glycol (LUBRICANT EYE DROPS) 0.4-0.3 % SOLN  Place 1 drop into both eyes 3 (three) times daily as needed (dry/irritated eyes.).    [provider]  polyethylene glycol (MIRALAX / GLYCOLAX) packet Take 17 g by mouth daily as needed (constipation). Patient not taking: Reported on 03/12/2022    [provider]  torsemide (DEMADEX) 5 MG tablet Take 5 mg by mouth daily.    [provider]    Physical Exam: Vitals:   05/17/22 1209 05/17/22 1215 05/17/22 1223  BP:  131/64   Pulse:  63   Resp:  18   Temp:  98 F (36.7 C)   TempSrc:  Oral   SpO2: 97% 98%   Weight:   63.5 kg  Height:   5' (1.524 m)    Constitutional: NAD, calm, comfortable, pleasant elderly female Vitals:   05/17/22 1209 05/17/22 1215 05/17/22 1223  BP:  131/64   Pulse:  63   Resp:  18   Temp:  98 F (36.7 C)   TempSrc:  Oral   SpO2: 97% 98%   Weight:   63.5 kg  Height:   5' (1.524 m)   Eyes: PERRL, lids and conjunctivae normal ENMT: Mucous membranes are moist.  Neck: normal, supple, no masses, no thyromegaly Respiratory: clear to auscultation bilaterally, no wheezing, no crackles. Normal respiratory effort. No accessory muscle use.  Cardiovascular: Regular rate and rhythm, no murmurs / rubs /  gallops. Abdomen: no tenderness, no masses palpated. No hepatosplenomegaly. Bowel sounds positive.  Musculoskeletal: no clubbing / cyanosis. No joint deformity upper and lower extremities.  Trace lower extremity edema Skin: no rashes, lesions, ulcers. No induration Neurologic: CN 2-12 grossly intact.  Strength 5/5 in all 4.  Psychiatric: Normal judgment and insight. Alert and oriented x 3. Normal mood.   Foley Catheter:None  Labs on Admission: I have personally reviewed following labs and imaging studies  CBC: No results for input(s): "WBC", "NEUTROABS", "HGB", "HCT", "MCV", "PLT" in the last 168 hours. Basic Metabolic Panel: No results for input(s): "NA", "K", "CL", "CO2", "GLUCOSE", "BUN", "CREATININE", "CALCIUM", "MG", "PHOS" in the last 168 hours. GFR: CrCl cannot be calculated (Patient's most recent lab result is older than the maximum 21 days allowed.). Liver Function Tests: No results for input(s): "AST", "ALT", "ALKPHOS", "BILITOT", "PROT", "ALBUMIN" in the last 168 hours. No results for input(s): "LIPASE", "AMYLASE" in the last 168 hours. No results for input(s): "AMMONIA" in the last 168 hours. Coagulation Profile: No results for input(s): "INR", "PROTIME" in the last 168 hours. Cardiac Enzymes: No results for input(s): "CKTOTAL", "CKMB", "CKMBINDEX", "TROPONINI" in the last 168 hours. BNP (last 3 results) No results for input(s): "PROBNP" in the last 8760 hours. HbA1C: No results for input(s): "HGBA1C" in the last 72 hours. CBG: No results for input(s): "GLUCAP" in the last 168 hours. Lipid Profile: No results for input(s): "CHOL", "HDL", "LDLCALC", "TRIG", "CHOLHDL", "LDLDIRECT" in the last 72 hours. Thyroid Function Tests: No results for input(s): "TSH", "T4TOTAL", "FREET4", "T3FREE", "THYROIDAB" in the last 72 hours. Anemia Panel: No results for input(s): "VITAMINB12", "FOLATE", "FERRITIN", "TIBC", "IRON", "RETICCTPCT" in the last 72 hours. Urine analysis:     Component Value Date/Time   COLORURINE YELLOW 02/24/2022 1938   APPEARANCEUR CLEAR 02/24/2022 1938   LABSPEC 1.009 02/24/2022 1938   PHURINE 5.5 02/24/2022 1938   GLUCOSEU NEGATIVE 02/24/2022 1938   HGBUR NEGATIVE 02/24/2022 1938   BILIRUBINUR NEGATIVE 02/24/2022 1938   KETONESUR NEGATIVE 02/24/2022 1938   PROTEINUR NEGATIVE 02/24/2022 1938   NITRITE NEGATIVE 02/24/2022 1938  LEUKOCYTESUR SMALL (A) 02/24/2022 1938    Radiological Exams on Admission: DG Pelvis 1-2 Views  Result Date: 05/17/2022 CLINICAL DATA:  Pain post fall EXAM: PELVIS - 1-2 VIEW COMPARISON:  CT 01/31/2018 FINDINGS: There is no evidence of pelvic fracture or diastasis. No pelvic bone lesions are seen. 8 mm calculus projects over the left renal collecting system as before. IMPRESSION: 1. No fracture or other acute finding 2. 8 mm left renal calculus. Electronically Signed   By: Lucrezia Europe M.D.   On: 05/17/2022 14:36   DG Chest 2 View  Result Date: 05/17/2022 CLINICAL DATA:  Per EMS- [facility] is from [facility].Patient was hurrying to go to the bathroom and fell out of her wheelchair. Patient hit the right side of her head, a skin tear to the right elbow, and c/o pain mid right back EXAM: CHEST - 2 VIEW COMPARISON:  02/24/2022 FINDINGS: Lungs are clear. Heart size and mediastinal contours are within normal limits. 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 Mitral annulus calcifications. Aortic Atherosclerosis (ICD10-170.0). No effusion.  No pneumothorax. Cervical fixation hardware. Chronic T8 vertebral compression deformity. The subacute T2 and T4 fractures seen on today's CT are less conspicuous. IMPRESSION: No acute cardiopulmonary disease. Electronically Signed   By: Lucrezia Europe M.D.   On: 05/17/2022 14:34   DG Elbow Complete Right  Result Date: 05/17/2022 CLINICAL DATA:  [place], skin laceration, pain EXAM: RIGHT ELBOW - COMPLETE 3+ VIEW COMPARISON:  None Available. FINDINGS: There is  no evidence of fracture, dislocation, or joint effusion. There is no evidence of arthropathy or other focal bone abnormality. There is dorsal soft tissue swelling at the level of the distal humerus. IMPRESSION: Dorsal soft tissue swelling. No fracture or other acute finding. Electronically Signed   By: Lucrezia Europe M.D.   On: 05/17/2022 14:22   CT Thoracic Spine Wo Contrast  Result Date: 05/17/2022 CLINICAL DATA:  Back pain after fall from wheelchair EXAM: CT THORACIC SPINE WITHOUT CONTRAST TECHNIQUE: Multidetector CT images of the thoracic were obtained using the standard protocol without intravenous contrast. RADIATION DOSE REDUCTION: This exam was performed according to the departmental dose-optimization program which includes automated exposure control, adjustment of the mA and/or kV according to patient size and/or use of iterative reconstruction technique. COMPARISON:  X-ray 02/03/2020.  CT 06/08/2017 FINDINGS: Alignment: Exaggerated thoracic kyphosis.  No traumatic listhesis. Vertebrae: Acute to subacute appearing mild superior endplate compression fractures of the T2 and T4 vertebral bodies within the thoracic spine as well as a mild inferior endplate compression fracture of C7 within the imaged lower cervical spine. Chronic severe inferior endplate compression fracture of T8 which is unchanged. Remaining thoracic vertebral body heights are maintained. Nondisplaced fracture through the right transverse processes of the T7, T8, T9, and T10 vertebrae (series 9, images 50 9-61). Nondisplaced fracture of the posterior right sixth rib. Nondisplaced fractures of the posterior right eighth and ninth ribs near the costovertebral junction (series 4, images 94 and 105). No lytic or sclerotic bone lesion. Paraspinal and other soft tissues: Aortic and coronary artery atherosclerosis. No pleural effusion or pneumothorax. Nonobstructing stone within the left kidney. Disc levels: No significant spondylosis of the  thoracic spine. IMPRESSION: 1. Acute-to-subacute appearing mild superior endplate compression fractures of the T2 and T4 vertebral bodies within the thoracic spine as well as a mild inferior endplate compression fracture of C7 within the imaged lower cervical spine. 2. Nondisplaced fracture through the right transverse processes of the T7, T8, T9, and T10  vertebrae. 3. Nondisplaced fractures of the posterior right sixth, eighth, and ninth ribs. 4. Chronic severe inferior endplate compression fracture of T8. 5. Aortic and coronary artery atherosclerosis (ICD10-I70.0). Electronically Signed   By: Davina Poke D.O.   On: 05/17/2022 14:10   CT Head Wo Contrast  Result Date: 05/17/2022 CLINICAL DATA:  Fall out of wheelchair. EXAM: CT HEAD WITHOUT CONTRAST CT CERVICAL SPINE WITHOUT CONTRAST TECHNIQUE: Multidetector CT imaging of the head and cervical spine was performed following the standard protocol without intravenous contrast. Multiplanar CT image reconstructions of the cervical spine were also generated. RADIATION DOSE REDUCTION: This exam was performed according to the departmental dose-optimization program which includes automated exposure control, adjustment of the mA and/or kV according to patient size and/or use of iterative reconstruction technique. COMPARISON:  February 24, 2021. FINDINGS: CT HEAD FINDINGS Brain: No evidence of acute infarction, hemorrhage, hydrocephalus, extra-axial collection or mass lesion/mass effect. Vascular: No hyperdense vessel or unexpected calcification. Skull: Normal. Negative for fracture or focal lesion. Sinuses/Orbits: No acute finding. Other: None. CT CERVICAL SPINE FINDINGS Alignment: Normal. Skull base and vertebrae: No acute fracture. No primary bone lesion or focal pathologic process. Soft tissues and spinal canal: No prevertebral fluid or swelling. No visible canal hematoma. Disc levels: Status post surgical anterior fusion of C5-6. Moderate degenerative disc disease  is noted at C6-7. Upper chest: Negative. Other: None. IMPRESSION: No acute intracranial abnormality seen. No acute abnormality seen in the cervical spine. Electronically Signed   By: Marijo Conception M.D.   On: 05/17/2022 14:03   CT Cervical Spine Wo Contrast  Result Date: 05/17/2022 CLINICAL DATA:  Fall out of wheelchair. EXAM: CT HEAD WITHOUT CONTRAST CT CERVICAL SPINE WITHOUT CONTRAST TECHNIQUE: Multidetector CT imaging of the head and cervical spine was performed following the standard protocol without intravenous contrast. Multiplanar CT image reconstructions of the cervical spine were also generated. RADIATION DOSE REDUCTION: This exam was performed according to the departmental dose-optimization program which includes automated exposure control, adjustment of the mA and/or kV according to patient size and/or use of iterative reconstruction technique. COMPARISON:  February 24, 2021. FINDINGS: CT HEAD FINDINGS Brain: No evidence of acute infarction, hemorrhage, hydrocephalus, extra-axial collection or mass lesion/mass effect. Vascular: No hyperdense vessel or unexpected calcification. Skull: Normal. Negative for fracture or focal lesion. Sinuses/Orbits: No acute finding. Other: None. CT CERVICAL SPINE FINDINGS Alignment: Normal. Skull base and vertebrae: No acute fracture. No primary bone lesion or focal pathologic process. Soft tissues and spinal canal: No prevertebral fluid or swelling. No visible canal hematoma. Disc levels: Status post surgical anterior fusion of C5-6. Moderate degenerative disc disease is noted at C6-7. Upper chest: Negative. Other: None. IMPRESSION: No acute intracranial abnormality seen. No acute abnormality seen in the cervical spine. Electronically Signed   By: Marijo Conception M.D.   On: 05/17/2022 14:03     Assessment/Plan Principal Problem:   Fall Active Problems:   Hyperlipidemia   Poor balance   CKD (chronic kidney disease)   Pacemaker   (HFpEF) heart failure with  preserved ejection fraction ([place])  [facility].  Skeleton survey does not show any obvious fracture or dislocation of hip or elbow  but showed acute-to-subacute appearing mild superior endplate compression fractures of the T2 and T4 vertebral bodies within the thoracic spine as well as a mild inferior endplate compression fracture of C7 within the imaged lower cervical spine.Nondisplaced fracture through the right transverse processes of the T7, T8, T9, and T10  vertebrae.Nondisplaced fractures of the posterior right sixth, eighth, and ninth ribs. At baseline, patient ambulates with the help of wheelchair, walker.  She lives at [facility]. Will place TLSO brace. We will initiate pain management, supportive care PT/OT consulted.  Might need a skilled nursing facility on discharge.  Hyperlipidemia: Takes Zetia  Hypertension: Currently blood pressure stable.  Continue metoprolol  History of anxiety: Continue Lexapro, buspirone  History of GERD: Continue PPI  Diastolic CHF: Currently appears near euvolemic but has trace lower extremity edema.  Continue torsemide 5 daily that she takes at home.  CKD stage II: Baseline creatinine around 1.2.  New lab work pending  Patient is status post with pace maker placement        Severity of Illness: The appropriate patient status for this patient is OBSERVATION.  DVT prophylaxis: SCD Code Status: Full Family Communication: Friend at bedside Consults called: None     Shelly Coss MD [facility]  05/17/2022, 2:57 PM

## 2022-05-18 DIAGNOSIS — I5030 Unspecified diastolic (congestive) heart failure: Secondary | ICD-10-CM

## 2022-05-18 DIAGNOSIS — Z79899 Other long term (current) drug therapy: Secondary | ICD-10-CM | POA: Diagnosis not present

## 2022-05-18 DIAGNOSIS — S22020A Wedge compression fracture of second thoracic vertebra, initial encounter for closed fracture: Secondary | ICD-10-CM | POA: Diagnosis present

## 2022-05-18 DIAGNOSIS — W050XXA Fall from non-moving wheelchair, initial encounter: Secondary | ICD-10-CM | POA: Diagnosis present

## 2022-05-18 DIAGNOSIS — N1832 Chronic kidney disease, stage 3b: Secondary | ICD-10-CM

## 2022-05-18 DIAGNOSIS — S2241XA Multiple fractures of ribs, right side, initial encounter for closed fracture: Secondary | ICD-10-CM | POA: Diagnosis present

## 2022-05-18 DIAGNOSIS — R2689 Other abnormalities of gait and mobility: Secondary | ICD-10-CM | POA: Diagnosis not present

## 2022-05-18 DIAGNOSIS — S51011A Laceration without foreign body of right elbow, initial encounter: Secondary | ICD-10-CM | POA: Diagnosis present

## 2022-05-18 DIAGNOSIS — G20A1 Parkinson's disease without dyskinesia, without mention of fluctuations: Secondary | ICD-10-CM | POA: Diagnosis present

## 2022-05-18 DIAGNOSIS — S12690A Other displaced fracture of seventh cervical vertebra, initial encounter for closed fracture: Secondary | ICD-10-CM | POA: Diagnosis present

## 2022-05-18 DIAGNOSIS — S22061A Stable burst fracture of T7-T8 vertebra, initial encounter for closed fracture: Secondary | ICD-10-CM | POA: Diagnosis present

## 2022-05-18 DIAGNOSIS — Z886 Allergy status to analgesic agent status: Secondary | ICD-10-CM | POA: Diagnosis not present

## 2022-05-18 DIAGNOSIS — Z853 Personal history of malignant neoplasm of breast: Secondary | ICD-10-CM | POA: Diagnosis not present

## 2022-05-18 DIAGNOSIS — Z8719 Personal history of other diseases of the digestive system: Secondary | ICD-10-CM | POA: Diagnosis not present

## 2022-05-18 DIAGNOSIS — Y92121 Bathroom in nursing home as the place of occurrence of the external cause: Secondary | ICD-10-CM | POA: Diagnosis not present

## 2022-05-18 DIAGNOSIS — E782 Mixed hyperlipidemia: Secondary | ICD-10-CM

## 2022-05-18 DIAGNOSIS — Z888 Allergy status to other drugs, medicaments and biological substances status: Secondary | ICD-10-CM | POA: Diagnosis not present

## 2022-05-18 DIAGNOSIS — W19XXXA Unspecified fall, initial encounter: Secondary | ICD-10-CM | POA: Diagnosis not present

## 2022-05-18 DIAGNOSIS — I495 Sick sinus syndrome: Secondary | ICD-10-CM

## 2022-05-18 DIAGNOSIS — I130 Hypertensive heart and chronic kidney disease with heart failure and stage 1 through stage 4 chronic kidney disease, or unspecified chronic kidney disease: Secondary | ICD-10-CM | POA: Diagnosis present

## 2022-05-18 DIAGNOSIS — M549 Dorsalgia, unspecified: Secondary | ICD-10-CM

## 2022-05-18 DIAGNOSIS — F419 Anxiety disorder, unspecified: Secondary | ICD-10-CM | POA: Diagnosis present

## 2022-05-18 DIAGNOSIS — Z950 Presence of cardiac pacemaker: Secondary | ICD-10-CM | POA: Diagnosis not present

## 2022-05-18 DIAGNOSIS — I959 Hypotension, unspecified: Secondary | ICD-10-CM | POA: Diagnosis not present

## 2022-05-18 DIAGNOSIS — M810 Age-related osteoporosis without current pathological fracture: Secondary | ICD-10-CM | POA: Diagnosis present

## 2022-05-18 DIAGNOSIS — N1831 Chronic kidney disease, stage 3a: Secondary | ICD-10-CM | POA: Diagnosis not present

## 2022-05-18 DIAGNOSIS — I5032 Chronic diastolic (congestive) heart failure: Secondary | ICD-10-CM | POA: Diagnosis present

## 2022-05-18 DIAGNOSIS — Z923 Personal history of irradiation: Secondary | ICD-10-CM | POA: Diagnosis not present

## 2022-05-18 DIAGNOSIS — Z993 Dependence on wheelchair: Secondary | ICD-10-CM | POA: Diagnosis not present

## 2022-05-18 DIAGNOSIS — S0103XA Puncture wound without foreign body of scalp, initial encounter: Secondary | ICD-10-CM | POA: Diagnosis present

## 2022-05-18 DIAGNOSIS — S22071A Stable burst fracture of T9-T10 vertebra, initial encounter for closed fracture: Secondary | ICD-10-CM | POA: Diagnosis present

## 2022-05-18 DIAGNOSIS — M546 Pain in thoracic spine: Secondary | ICD-10-CM | POA: Diagnosis not present

## 2022-05-18 DIAGNOSIS — S2249XA Multiple fractures of ribs, unspecified side, initial encounter for closed fracture: Secondary | ICD-10-CM

## 2022-05-18 DIAGNOSIS — S22009A Unspecified fracture of unspecified thoracic vertebra, initial encounter for closed fracture: Secondary | ICD-10-CM

## 2022-05-18 DIAGNOSIS — K219 Gastro-esophageal reflux disease without esophagitis: Secondary | ICD-10-CM | POA: Diagnosis present

## 2022-05-18 DIAGNOSIS — R531 Weakness: Secondary | ICD-10-CM | POA: Diagnosis not present

## 2022-05-18 DIAGNOSIS — S22040A Wedge compression fracture of fourth thoracic vertebra, initial encounter for closed fracture: Secondary | ICD-10-CM | POA: Diagnosis present

## 2022-05-18 DIAGNOSIS — Z7401 Bed confinement status: Secondary | ICD-10-CM | POA: Diagnosis not present

## 2022-05-18 LAB — CBC
HCT: 32.6 % — ABNORMAL LOW (ref 36.0–46.0)
Hemoglobin: 10.9 g/dL — ABNORMAL LOW (ref 12.0–15.0)
MCH: 33.7 pg (ref 26.0–34.0)
MCHC: 33.4 g/dL (ref 30.0–36.0)
MCV: 100.9 fL — ABNORMAL HIGH (ref 80.0–100.0)
Platelets: 279 10*3/uL (ref 150–400)
RBC: 3.23 MIL/uL — ABNORMAL LOW (ref 3.87–5.11)
RDW: 12.6 % (ref 11.5–15.5)
WBC: 8 10*3/uL (ref 4.0–10.5)
nRBC: 0 % (ref 0.0–0.2)

## 2022-05-18 LAB — BASIC METABOLIC PANEL
Anion gap: 8 (ref 5–15)
BUN: 17 mg/dL (ref 8–23)
CO2: 26 mmol/L (ref 22–32)
Calcium: 9 mg/dL (ref 8.9–10.3)
Chloride: 103 mmol/L (ref 98–111)
Creatinine, Ser: 1.22 mg/dL — ABNORMAL HIGH (ref 0.44–1.00)
GFR, Estimated: 43 mL/min — ABNORMAL LOW (ref >60)
Glucose, Bld: 109 mg/dL — ABNORMAL HIGH (ref 70–99)
Potassium: 3.5 mmol/L (ref 3.5–5.1)
Sodium: 137 mmol/L (ref 135–145)

## 2022-05-18 LAB — CK: Total CK: 50 U/L (ref 38–234)

## 2022-05-18 MED ORDER — ORAL CARE MOUTH RINSE: 15.0000 mL | OROMUCOSAL | Status: DC | PRN

## 2022-05-18 MED ORDER — ACETAMINOPHEN 325 MG PO TABS
650.0000 mg | ORAL_TABLET | Freq: Four times a day (QID) | ORAL | Status: DC
  Administered 2022-05-18 – 2022-05-19 (×4): 650 mg via ORAL
  Filled 2022-05-18 (×4): qty 2

## 2022-05-18 MED ORDER — HYDROMORPHONE HCL 1 MG/ML IJ SOLN: 0.5000 mg | INTRAMUSCULAR | Status: DC | PRN

## 2022-05-18 MED ORDER — SENNOSIDES-DOCUSATE SODIUM 8.6-50 MG PO TABS: 1.0000 | ORAL_TABLET | Freq: Two times a day (BID) | ORAL | Status: DC | PRN

## 2022-05-18 NOTE — Evaluation (Signed)
Physical Therapy Evaluation Patient Details Name: [name] MRN: [id number] DOB: 1934-09-02 Today's Date: 05/18/2022  History of Present Illness  87 y.o. female with medical history significant of hypertension, diastolic congestive heart failure, Parkinson disease, hyperlipidemia, anxiety who presented from assisted living facility for the evaluation of fall.  Patient usually ambulates with the help of wheelchair/walker.  She was about to go to her bathroom for wheelchair but her wheelchair got away and see here in the wall and fell on her right side.  Per notes, Imaging: "Showed chronic T8 compression fracture.  Pelvic x-ray did not show any fracture.  CT thoracic spine showed acute-to-subacute appearing mild superior endplate compression fractures of the T2 and T4 vertebral bodies within the thoracic spine as well as a mild inferior endplate compression fracture of C7 within the imaged lower cervical spine. Nondisplaced fracture through the right transverse processes of  the T7, T8, T9, and T10 vertebrae. Nondisplaced fractures of the posterior right sixth, eighth, and ninth ribs.  TLSO brace ordered.  Patient admitted for the pain management and PT/OT evaluation"  Clinical Impression  Pt admitted with above diagnosis.  Pt currently with functional limitations due to the deficits listed below (see PT Problem List). Pt will benefit from skilled PT to increase their independence and safety with mobility to allow discharge to the venue listed below.   Pt requiring at least mod assist for bed mobility due to pain and felt unable to stand today (reports she has had very little sleep).  Pt will need assist to don TLSO if used.  Pt not tolerating TLSO at present and states she does not want to wear brace upon d/c.  Pt prefers to d/c back to her ALF with her spouse and states if she uses pull cords, an aide will come assist her.  Pt typically able to transfer without physical assist however now requiring  assistance.  Will recommend SNF upon d/c in case ALF is not able to provide increased physical assist.        Recommendations for follow up therapy are one component of a multi-disciplinary discharge planning process, led by the attending physician.  Recommendations may be updated based on patient status, additional functional criteria and insurance authorization.  Follow Up Recommendations Skilled nursing-short term rehab (<3 hours/day) Can patient physically be transported by private vehicle: No    Assistance Recommended at Discharge Frequent or constant Supervision/Assistance  Patient can return home with the following  A lot of help with walking and/or transfers;A lot of help with bathing/dressing/bathroom    Equipment Recommendations None recommended by PT  Recommendations for Other Services       Functional Status Assessment Patient has had a recent decline in their functional status and demonstrates the ability to make significant improvements in function in a reasonable and predictable amount of time.     Precautions / Restrictions Precautions Precautions: Fall;Back Precaution Comments: utilizing log roll technique, pt reports she doesn't think she can tolerate TLSO, attempted to don today however pt not tolerating (RN notified) Required Braces or Orthoses: Spinal Brace Spinal Brace: Thoracolumbosacral orthotic;Applied in sitting position      Mobility  Bed Mobility Overal bed mobility: Needs Assistance Bed Mobility: Rolling, Sidelying to Sit, Sit to Sidelying Rolling: Min assist Sidelying to sit: Mod assist     Sit to sidelying: Min assist General bed mobility comments: verbal cues for log roll technique, cues for hand placement to self assist with rail, assist for trunk upright due to  pain, light assist for LEs to return to bed upon return to sidelying    Transfers                   General transfer comment: pt declined attempting today from fatigue and  pain    Ambulation/Gait               General Gait Details: typically only transfer to w/c at baseline  Stairs            Wheelchair Mobility    Modified Rankin (Stroke Patients Only)       Balance Overall balance assessment: Needs assistance Sitting-balance support: No upper extremity supported, Feet supported Sitting balance-Leahy Scale: Fair Sitting balance - Comments: at least fair, did not perform challenges or perturbations due to numerous fractures and pt not tolerating back brace                                     Pertinent Vitals/Pain Pain Assessment Pain Assessment: 0-10 Pain Score: 10-Worst pain ever Pain Location: right posterior thoracic area Pain Descriptors / Indicators: Sore, Tender Pain Intervention(s): Monitored during session, Repositioned    Home Living Family/patient expects to be discharged to:: Assisted living                 Home Equipment: Wheelchair - power;Wheelchair - manual;Other (comment) (upright walker with a seat but doesn't use it) Additional Comments: lives in ALF with spouse but he has poor health    Prior Function Prior Level of Function : Needs assist       Physical Assist : ADLs (physical)     Mobility Comments: states she is able to get into/out of w/c from bed or toilet without assist; typically uses w/c for mobility due to poor balance ADLs Comments: an aide will come when she pulls the cord to assist with bathing/dressing     Hand Dominance        Extremity/Trunk Assessment   Upper Extremity Assessment Upper Extremity Assessment:  (observed UE tremors which pt reports is baseline)    Lower Extremity Assessment Lower Extremity Assessment: Generalized weakness       Communication   Communication: No difficulties  Cognition Arousal/Alertness: Awake/alert Behavior During Therapy: Flat affect Overall Cognitive Status: Within Functional Limits for tasks assessed                                           General Comments      Exercises     Assessment/Plan    PT Assessment Patient needs continued PT services  PT Problem List Decreased mobility;Decreased strength;Decreased activity tolerance;Decreased balance;Pain;Decreased knowledge of use of DME       PT Treatment Interventions Gait training;Therapeutic exercise;Balance training;Functional mobility training;Therapeutic activities;Patient/family education;DME instruction;Wheelchair mobility training    PT Goals (Current goals can be found in the Care Plan section)  Acute Rehab PT Goals Patient Stated Goal: pt wants to d/c back to her facility to be with her husband (RN reports he is on hospice) PT Goal Formulation: With patient Time For Goal Achievement: 06/01/22 Potential to Achieve Goals: Good    Frequency Min 3X/week     Co-evaluation               AM-PAC PT "6 Clicks" Mobility  Outcome Measure  Help needed turning from your back to your side while in a flat bed without using bedrails?: A Lot Help needed moving from lying on your back to sitting on the side of a flat bed without using bedrails?: A Lot Help needed moving to and from a bed to a chair (including a wheelchair)?: Total Help needed standing up from a chair using your arms (e.g., wheelchair or bedside chair)?: Total Help needed to walk in hospital room?: Total Help needed climbing 3-5 steps with a railing? : Total 6 Click Score: 8    End of Session   Activity Tolerance: Patient limited by pain;Patient limited by fatigue Patient left: in bed;with call bell/phone within reach Nurse Communication: Mobility status PT Visit Diagnosis: History of falling (Z91.81);Muscle weakness (generalized) (M62.81)    Time: 1020-1049 PT Time Calculation (min) (ACUTE ONLY): 29 min   Charges:   PT Evaluation $PT Eval Low Complexity: 1 Low PT Treatments $Therapeutic Activity: 8-22 mins       Jannette Spanner PT, DPT Physical  Therapist Acute Rehabilitation Services Preferred contact method: Secure Chat Weekend Pager Only: [phone] Office: [place] 05/18/2022, 1:49 PM

## 2022-05-18 NOTE — Evaluation (Signed)
Clinical/Bedside Swallow Evaluation Patient Details  Name: [name] MRN: [id number] Date of Birth: 08/25/1934  Today's Date: 05/18/2022 Time: SLP Start Time (ACUTE ONLY): 1700 SLP Stop Time (ACUTE ONLY): 6834 SLP Time Calculation (min) (ACUTE ONLY): 20 min  Past Medical History:  Past Medical History:  Diagnosis Date   Allergy    Anemia    Cancer of left breast ([place]) 1978   CHF (congestive heart failure) (HCC)    Chronic thoracic back pain    "T8; fracture; 03/2015; no OR" (05/05/2016)   GERD (gastroesophageal reflux disease)    Heart murmur    History of hiatal hernia    Hyperlipidemia    Hyperplastic colon polyp    Hypertension    IBS (irritable bowel syndrome)    Multiple thyroid nodules    Osteoporosis    T8 compression fx 03/2015    Personal history of radiation therapy    Presence of permanent cardiac pacemaker    Vitamin B12 deficiency    Past Surgical History:  Past Surgical History:  Procedure Laterality Date   ANTERIOR CERVICAL DECOMP/DISCECTOMY FUSION  2008   C5-6   AUGMENTATION MAMMAPLASTY     BACK SURGERY     BREAST SURGERY     CARDIAC CATHETERIZATION  2001   EP IMPLANTABLE DEVICE N/A 05/05/2016   Procedure: Pacemaker Implant;  Surgeon: Evans Lance, MD;  Location: [facility];  Service: Cardiovascular;  Laterality: N/A;   EXCISIONAL HEMORRHOIDECTOMY  1984   With subsequent correction of surgery   INSERT / REPLACE / REMOVE PACEMAKER     INTRAVASCULAR PRESSURE WIRE/FFR STUDY N/A 10/07/2019   Procedure: INTRAVASCULAR PRESSURE WIRE/FFR STUDY;  Surgeon: Jettie Booze, MD;  Location: [facility];  Service: Cardiovascular;  Laterality: N/A;   KNEE ARTHROSCOPY Left    "meniscus tear"   LEFT HEART CATH AND CORONARY ANGIOGRAPHY N/A 10/07/2019   Procedure: LEFT HEART CATH AND CORONARY ANGIOGRAPHY;  Surgeon: Jettie Booze, MD;  Location: [facility];  Service: Cardiovascular;  Laterality: N/A;   MASTECTOMY Left 1978    PLACEMENT OF BREAST IMPLANTS Left 1981   REDUCTION MAMMAPLASTY Right    SHOULDER ARTHROSCOPY W/ ROTATOR CUFF REPAIR Left 02/2006   "tear"   TUBAL LIGATION     HPI:  87 y.o. female with medical history significant of hypertension, diastolic congestive heart failure, Parkinson disease, hyperlipidemia, anxiety who presented from assisted living facility for the evaluation of fall. Imaging: "Showed chronic T8 compression fracture.  Pelvic x-ray did not show any fracture.  CT thoracic spine showed acute-to-subacute appearing mild superior endplate compression fractures of the T2 and T4 vertebral bodies within the thoracic spine as well as a mild inferior endplate compression fracture of C7 within the imaged lower cervical spine. SLP swallow evaluation ordered secondary to patient with observed difficulty swallowing pills as well as liquids.    Assessment / Plan / Recommendation  Clinical Impression  Patient presenting with clinical s/s of dysphagia as per this bedside/clinical swallow evaluation. She exhibited suspected swallow initaiton delay and immediate and some slightly delayed coughs following sips of thin liquids (water) but with nectar thick liquids, patient did not exhibit any overt s/s aspiration or penetration. Initially she did not agree that the nectar thick liquid was better tolerated but after a few more sips she did seem to notice its benefit. As per MBS completed August of 2022, patient with swallow initiation delays which were lessened when nectar thick, puree or regular solids were  swallowed as compared to thin liquids. At this time, SLP recommending nectar thick liquids, continue with regular solids and patient may have thin liquids in between meals if she is able to sit up well enough. SLP will f/u with patient for toleration and determination of LRD. SLP Visit Diagnosis: Dysphagia, unspecified (R13.10)    Aspiration Risk  Mild aspiration risk;Moderate aspiration risk    Diet  Recommendation Regular;Nectar-thick liquid   Liquid Administration via: Cup;Straw Medication Administration: Crushed with puree Supervision: Patient able to self feed;Full supervision/cueing for compensatory strategies Compensations: Minimize environmental distractions;Slow rate;Small sips/bites Postural Changes: Seated upright at 90 degrees;Remain upright for at least 30 minutes after po intake    Other  Recommendations Oral Care Recommendations: Oral care BID Other Recommendations: Order thickener from pharmacy;Prohibited food (jello, ice cream, thin soups);Remove water pitcher;Clarify dietary restrictions    Recommendations for follow up therapy are one component of a multi-disciplinary discharge planning process, led by the attending physician.  Recommendations may be updated based on patient status, additional functional criteria and insurance authorization.  Follow up Recommendations Follow physician's recommendations for discharge plan and follow up therapies      Assistance Recommended at Discharge Frequent or constant Supervision/Assistance  Functional Status Assessment Patient has had a recent decline in their functional status and demonstrates the ability to make significant improvements in function in a reasonable and predictable amount of time.  Frequency and Duration min 2x/week  1 week       Prognosis Prognosis for Safe Diet Advancement: Good Barriers to Reach Goals: Severity of deficits;Time post onset      Swallow Study   General Date of Onset: 05/17/22 HPI: 87 y.o. female with medical history significant of hypertension, diastolic congestive heart failure, Parkinson disease, hyperlipidemia, anxiety who presented from assisted living facility for the evaluation of fall. Imaging: "Showed chronic T8 compression fracture.  Pelvic x-ray did not show any fracture.  CT thoracic spine showed acute-to-subacute appearing mild superior endplate compression fractures of the T2  and T4 vertebral bodies within the thoracic spine as well as a mild inferior endplate compression fracture of C7 within the imaged lower cervical spine. SLP swallow evaluation ordered secondary to patient with observed difficulty swallowing pills as well as liquids. Type of Study: Bedside Swallow Evaluation Previous Swallow Assessment: OP MBS 02/2021 showing decline of swallow from previous Diet Prior to this Study: Regular;Thin liquids Temperature Spikes Noted: No Respiratory Status: Room air History of Recent Intubation: No Behavior/Cognition: Alert;Cooperative;Pleasant mood Oral Cavity Assessment: Within Functional Limits Oral Care Completed by SLP: No Oral Cavity - Dentition: Adequate natural dentition Vision: Functional for self-feeding Self-Feeding Abilities: Needs assist;Needs set up;Able to feed self Patient Positioning: Partially reclined;Postural control adequate for testing Baseline Vocal Quality: Low vocal intensity Volitional Cough: Weak Volitional Swallow: Able to elicit    Oral/Motor/Sensory Function Overall Oral Motor/Sensory Function: Generalized oral weakness   Ice Chips     Thin Liquid Thin Liquid: Impaired Presentation: Cup;Self Fed Pharyngeal  Phase Impairments: Suspected delayed Swallow;Cough - Immediate;Cough - Delayed    Nectar Thick Nectar Thick Liquid: Within functional limits Presentation: Cup;Self Fed   Honey Thick     Puree Puree: Not tested   Solid     Solid: Not tested      Sonia Baller, MA, CCC-SLP Speech Therapy

## 2022-05-18 NOTE — Progress Notes (Signed)
PROGRESS NOTE  [name] TFT:[id number] DOB: [name] 20, 1935   PCP: Pcp, No  Patient is from: ALF.  Uses wheelchair at baseline.  DOA: 05/17/2022 LOS: 0  Chief complaints Chief Complaint  Patient presents with   Fall     Brief Narrative / Interim history: 87 year old F with PMH of Parkinson's disease, diastolic CHF, HTN, HLD, anxiety and T8 compression fracture brought to ED after she had accidental fall at ALF.  Patient was about to go to her bathroom when her wheelchair got away and she fell against the wall on the right side.  She bruised the right side of her head and right elbow and complains some upper back pain.  In ED, CT head, CXR and pelvic x-ray without acute finding.  CT cervical spine showed acute to subacute appearing mild superior endplate compression fractures of the T2 and T4 vertebral bodies, mild inferior endplate compression fracture of C7 and not displaced fractures through the right transverse process of T7, T8, T9 and T10, and nondisplaced fractures of posterior right sixth, eighth and ninth ribs.  TLSO brace ordered.  Patient was admitted for pain management and PT/OT evaluation.  Subjective: Seen and examined earlier this morning.  No major events overnight of this morning.  Patient is fully oriented but not a great historian.  She reports 10/10 back pain.  Pain is worse with movement.  He denies numbness or tingling in the legs.  Objective: Vitals:   05/17/22 2302 05/18/22 0258 05/18/22 0423 05/18/22 0826  BP: 126/67 133/64 (!) 153/53 (!) 141/51  Pulse: 67 76 72 81  Resp: '18 15 16 18  '$ Temp: 98.6 F (37 C) 98.8 F (37.1 C) 98.4 F (36.9 C) 98.5 F (36.9 C)  TempSrc: Oral Oral Oral Oral  SpO2: 94% 97% 93% 97%  Weight:      Height:        Examination:  GENERAL: No apparent distress.  Nontoxic. HEENT: MMM.  Vision and hearing grossly intact.  NECK: Supple.  No apparent JVD.  RESP:  No IWOB.  Fair aeration bilaterally. CVS:  RRR. Heart sounds  normal.  ABD/GI/GU: BS+. Abd soft, NTND.  MSK/EXT:  Moves extremities. No apparent deformity. No edema.  SKIN: no apparent skin lesion or wound NEURO: Awake, alert and oriented x4 but somewhat slow.  No apparent focal neuro deficit. PSYCH: Calm. Normal affect.   Procedures:  None  Microbiology summarized: None  Assessment and plan: Principal Problem:   Fall Active Problems:   Hyperlipidemia   Osteoporosis   Sinus node dysfunction (HCC)   Poor balance   CKD (chronic kidney disease)   Pacemaker   (HFpEF) heart failure with preserved ejection fraction (HCC)   Multiple rib fractures   Thoracic spine fracture (HCC)   Acute back pain  Accidental fall at the nursing home: wheelchair-bound at baseline.  Wheelchair moved away and she fell on the right side Acute to subacute T2 and T4 mild superior endplate compression fracture Acute nondisplaced T7-T10 right transverse fractures Acute right sixth, eighth and ninth rib fracture History of osteoporosis: On Prolia. -Endorses 10/10 pain although she does not appear to be in that much distress.  Pain is worse with movement. -Supportive care including pain control -TLSO brace as needed but patient refuses -Schedule Tylenol -Continue oxycodone as needed for moderate pain  -Change IV morphine to IV Dilaudid for severe pain -Incentive spirometry -Fall precaution -PT/OT-recommended SNF.  Hyperlipidemia: Takes Zetia   Hypertension: Currently blood pressure stable.   -Continue metoprolol  History of anxiety:  -Continue Lexapro, buspirone   History of GERD:  -Continue PPI   Chronic diastolic CHF: Currently appears near euvolemic but has trace lower extremity edema.   -Continue torsemide 5 daily that she takes at home.  History of Parkinson disease.  Does not seem to be on medication for this.  Sick sinus syndrome s/p PPM.    Body mass index is 27.34 kg/m.          DVT prophylaxis:  SCDs Start: 05/17/22 1449  Code  Status: Full code Family Communication: None at bedside Level of care: Telemetry Status is: Observation The patient will require care spanning > 2 midnights and should be moved to inpatient because: Pain due to multiple traumatic thoracic and rib fractures   Final disposition: SNF Consultants:  None  Sch Meds:  Scheduled Meds:  acetaminophen  650 mg Oral Q6H WA   escitalopram  10 mg Oral Daily   ezetimibe  10 mg Oral q morning   [START ON 05/19/2022] ferrous sulfate  325 mg Oral Once per day on Mon Wed Fri   pantoprazole  40 mg Oral Daily   Continuous Infusions: PRN Meds:.busPIRone, HYDROmorphone (DILAUDID) injection, oxyCODONE, polyethylene glycol, senna-docusate  Antimicrobials: Anti-infectives (From admission, onward)    None        I have personally reviewed the following labs and images: CBC: Recent Labs  Lab 05/17/22 1458 05/18/22 0414  WBC 13.9* 8.0  NEUTROABS 11.7*  --   HGB 11.4* 10.9*  HCT 34.5* 32.6*  MCV 102.1* 100.9*  PLT 299 279   BMP &GFR Recent Labs  Lab 05/17/22 1458 05/18/22 0414  NA 139 137  K 3.9 3.5  CL 105 103  CO2 25 26  GLUCOSE 92 109*  BUN 19 17  CREATININE 1.59* 1.22*  CALCIUM 9.3 9.0   Estimated Creatinine Clearance: 28 mL/min (A) (by C-G formula based on SCr of 1.22 mg/dL (H)). Liver & Pancreas: No results for input(s): "AST", "ALT", "ALKPHOS", "BILITOT", "PROT", "ALBUMIN" in the last 168 hours. No results for input(s): "LIPASE", "AMYLASE" in the last 168 hours. No results for input(s): "AMMONIA" in the last 168 hours. Diabetic: No results for input(s): "HGBA1C" in the last 72 hours. No results for input(s): "GLUCAP" in the last 168 hours. Cardiac Enzymes: No results for input(s): "CKTOTAL", "CKMB", "CKMBINDEX", "TROPONINI" in the last 168 hours. No results for input(s): "PROBNP" in the last 8760 hours. Coagulation Profile: No results for input(s): "INR", "PROTIME" in the last 168 hours. Thyroid Function Tests: No  results for input(s): "TSH", "T4TOTAL", "FREET4", "T3FREE", "THYROIDAB" in the last 72 hours. Lipid Profile: No results for input(s): "CHOL", "HDL", "LDLCALC", "TRIG", "CHOLHDL", "LDLDIRECT" in the last 72 hours. Anemia Panel: No results for input(s): "VITAMINB12", "FOLATE", "FERRITIN", "TIBC", "IRON", "RETICCTPCT" in the last 72 hours. Urine analysis:    Component Value Date/Time   COLORURINE YELLOW 02/24/2022 1938   APPEARANCEUR CLEAR 02/24/2022 1938   LABSPEC 1.009 02/24/2022 1938   PHURINE 5.5 02/24/2022 1938   GLUCOSEU NEGATIVE 02/24/2022 1938   HGBUR NEGATIVE 02/24/2022 1938   BILIRUBINUR NEGATIVE 02/24/2022 1938   KETONESUR NEGATIVE 02/24/2022 1938   PROTEINUR NEGATIVE 02/24/2022 1938   NITRITE NEGATIVE 02/24/2022 1938   LEUKOCYTESUR SMALL (A) 02/24/2022 1938   Sepsis Labs: Invalid input(s): "PROCALCITONIN", "LACTICIDVEN"  Microbiology: No results found for this or any previous visit (from the past 240 hour(s)).  Radiology Studies: No results found.    Seairra Otani [place]  If 7PM-7AM, please contact night-coverage  [email or web address] 05/18/2022, 2:23 PM

## 2022-05-19 DIAGNOSIS — M546 Pain in thoracic spine: Secondary | ICD-10-CM | POA: Diagnosis not present

## 2022-05-19 DIAGNOSIS — W19XXXA Unspecified fall, initial encounter: Secondary | ICD-10-CM | POA: Diagnosis not present

## 2022-05-19 DIAGNOSIS — N1831 Chronic kidney disease, stage 3a: Secondary | ICD-10-CM

## 2022-05-19 DIAGNOSIS — S2241XA Multiple fractures of ribs, right side, initial encounter for closed fracture: Secondary | ICD-10-CM

## 2022-05-19 DIAGNOSIS — N1832 Chronic kidney disease, stage 3b: Secondary | ICD-10-CM

## 2022-05-19 DIAGNOSIS — I5030 Unspecified diastolic (congestive) heart failure: Secondary | ICD-10-CM | POA: Diagnosis not present

## 2022-05-19 LAB — RENAL FUNCTION PANEL
Albumin: 3.7 g/dL (ref 3.5–5.0)
Anion gap: 11 (ref 5–15)
BUN: 17 mg/dL (ref 8–23)
CO2: 25 mmol/L (ref 22–32)
Calcium: 9.3 mg/dL (ref 8.9–10.3)
Chloride: 104 mmol/L (ref 98–111)
Creatinine, Ser: 1.19 mg/dL — ABNORMAL HIGH (ref 0.44–1.00)
GFR, Estimated: 45 mL/min — ABNORMAL LOW (ref >60)
Glucose, Bld: 120 mg/dL — ABNORMAL HIGH (ref 70–99)
Phosphorus: 4.6 mg/dL (ref 2.5–4.6)
Potassium: 3.6 mmol/L (ref 3.5–5.1)
Sodium: 140 mmol/L (ref 135–145)

## 2022-05-19 LAB — CBC
HCT: 32.9 % — ABNORMAL LOW (ref 36.0–46.0)
Hemoglobin: 10.7 g/dL — ABNORMAL LOW (ref 12.0–15.0)
MCH: 33.1 pg (ref 26.0–34.0)
MCHC: 32.5 g/dL (ref 30.0–36.0)
MCV: 101.9 fL — ABNORMAL HIGH (ref 80.0–100.0)
Platelets: 264 10*3/uL (ref 150–400)
RBC: 3.23 MIL/uL — ABNORMAL LOW (ref 3.87–5.11)
RDW: 12.6 % (ref 11.5–15.5)
WBC: 8.7 10*3/uL (ref 4.0–10.5)
nRBC: 0 % (ref 0.0–0.2)

## 2022-05-19 LAB — MAGNESIUM: Magnesium: 2.1 mg/dL (ref 1.7–2.4)

## 2022-05-19 LAB — CK: Total CK: 53 U/L (ref 38–234)

## 2022-05-19 MED ORDER — SENNOSIDES-DOCUSATE SODIUM 8.6-50 MG PO TABS: 1.0000 | ORAL_TABLET | Freq: Two times a day (BID) | ORAL | 0 refills | Status: AC | PRN

## 2022-05-19 MED ORDER — ACETAMINOPHEN 325 MG PO TABS: ORAL_TABLET | ORAL | 0 refills | Status: DC

## 2022-05-19 MED ORDER — OXYCODONE HCL 5 MG PO TABS: 5.0000 mg | ORAL_TABLET | Freq: Three times a day (TID) | ORAL | 0 refills | Status: AC | PRN

## 2022-05-19 NOTE — [place] FL2 (Signed)
[place] MEDICAID FL2 LEVEL OF CARE SCREENING TOOL     IDENTIFICATION  Patient Name: [name] Birthdate: [birth year] Sex: female Admission Date (Current Location): 05/17/2022  [name] and [place] Number:  Herbalist and Address:  [facility],  [place], [place]      Provider Number: [id number]  Attending Physician Name and Address:  Mercy Riding, MD  Relative Name and Phone Number:  Renne Crigler [phone]    Current Level of Care: Hospital Recommended Level of Care: [place] Prior Approval Number:    Date Approved/Denied:   PASRR Number:    Discharge Plan: Other (Comment) ([facility]))    Current Diagnoses: Patient Active Problem List   Diagnosis Date Noted   Multiple rib fractures 05/18/2022   Thoracic spine fracture ([place]) 05/18/2022   Acute back pain 05/18/2022   Stage 3b chronic kidney disease (CKD) ([place]) 05/18/2022   Fall 05/17/2022   IBS (irritable bowel syndrome) 10/28/2020   Tremor of both hands 07/30/2020   Left arm swelling 04/10/2020   Aortic atherosclerosis ([place]) 03/30/2020   Cellulitis of arm, left 03/30/2020   Chronic thoracic back pain 02/03/2020   Iron deficiency anemia 02/02/2020   Near syncope 09/27/2019   Hypertension 09/20/2019   Acute pain of right knee 08/03/2019   (HFpEF) heart failure with preserved ejection fraction ([place]) 08/02/2019   Pacemaker 07/19/2019   CKD (chronic kidney disease) 01/30/2019   Lumbar spinal stenosis 01/10/2018   Degenerative arthritis of knee, bilateral 12/15/2017   Epigastric pain 09/09/2017   Neuralgia 09/09/2017   Claudication of calf muscles ([place]) 11/22/2016   DOE (dyspnea on exertion) 11/22/2016   Hyperreflexia of lower extremity 07/16/2016   Bilateral leg paresthesia 07/16/2016   Carotid arterial disease ([place]) 07/13/2016   Poor balance 06/23/2016   Weakness of both lower extremities 06/23/2016   Sinus node  dysfunction (HCC) 05/05/2016   Right shoulder pain 03/14/2016   Multinodular goiter (nontoxic) 02/21/2016   Neck pain 02/15/2016   Prediabetes 12/17/2015   Dysphagia 12/17/2015   Anxiety 11/13/2015   T8 vertebral fracture ([place]) 04/09/2015   Overweight 10/09/2011   Allergic rhinitis, cause unspecified    GERD (gastroesophageal reflux disease) 02/05/2011   Vitamin B 12 deficiency 04/17/2009   Hyperlipidemia 04/17/2009   Osteoporosis 04/17/2009   ADENOCARCINOMA, BREAST, HX OF 04/17/2009   Constipation 03/08/2009   COLONIC POLYPS, HYPERPLASTIC, HX OF 03/06/2009    Orientation RESPIRATION BLADDER Height & Weight     Self, Time, Situation  Normal Continent Weight: 63.5 kg Height:  5' (152.4 cm)  BEHAVIORAL SYMPTOMS/MOOD NEUROLOGICAL BOWEL NUTRITION STATUS      Continent Diet (heart healthy, fluids: nectar thick)  AMBULATORY STATUS COMMUNICATION OF NEEDS Skin   Extensive Assist Verbally Other (Comment) (05/17/22 right quarter size skin tear elbow)                       Personal Care Assistance Level of Assistance  Bathing, Feeding, Dressing Bathing Assistance: Maximum assistance Feeding assistance: Maximum assistance Dressing Assistance: Maximum assistance     Functional Limitations Info  Sight, Hearing, Speech Sight Info: Adequate Hearing Info: Adequate Speech Info: Adequate    SPECIAL CARE FACTORS FREQUENCY  OT (By licensed OT), PT (By licensed PT)     PT Frequency: 5x per week OT Frequency: 5x per week            Contractures Contractures Info: Not present    Additional  Factors Info  Code Status, Allergies Code Status Info: Full Allergies Info: Statins, Cymbalta (Duloxetine Hcl), Fosamax (Alendronate Sodium), Amitriptyline, Fosamax (Alendronate), Lyrica (Pregabalin), Ranitidine, Neurontin (Gabapentin), Zanaflex (Tizanidine)           Current Medications (05/19/2022):  This is the current hospital active medication list Current Facility-Administered  Medications  Medication Dose Route Frequency Provider Last Rate Last Admin   acetaminophen (TYLENOL) tablet 650 mg  650 mg Oral Q6H WA Gonfa, Taye T, MD   650 mg at 05/19/22 1003   busPIRone (BUSPAR) tablet 10 mg  10 mg Oral TID PRN Shelly Coss, MD       escitalopram (LEXAPRO) tablet 10 mg  10 mg Oral Daily Adhikari, Amrit, MD   10 mg at 05/19/22 1003   ezetimibe (ZETIA) tablet 10 mg  10 mg Oral q morning Adhikari, Tamsen Meek, MD   10 mg at 05/19/22 1004   ferrous sulfate tablet 325 mg  325 mg Oral Once per day on Mon Wed Fri Adhikari, Tamsen Meek, MD   325 mg at 05/19/22 1004   HYDROmorphone (DILAUDID) injection 0.5 mg  0.5 mg Intravenous Q3H PRN Mercy Riding, MD       Oral care mouth rinse  15 mL Mouth Rinse PRN Wendee Beavers T, MD       oxyCODONE (Oxy IR/ROXICODONE) immediate release tablet 5 mg  5 mg Oral Q4H PRN Shelly Coss, MD   5 mg at 05/18/22 1642   pantoprazole (PROTONIX) EC tablet 40 mg  40 mg Oral Daily Shelly Coss, MD   40 mg at 05/19/22 1004   polyethylene glycol (MIRALAX / GLYCOLAX) packet 17 g  17 g Oral Daily PRN Adhikari, Amrit, MD       senna-docusate (Senokot-S) tablet 1 tablet  1 tablet Oral BID PRN Mercy Riding, MD         Discharge Medications: Please see discharge summary for a list of discharge medications.  Relevant Imaging Results:  Relevant Lab Results:   Additional Information SSN: [id number]  Roseanne Kaufman, RN

## 2022-05-19 NOTE — TOC Transition Note (Addendum)
Transition of Care [facility]) - CM/SW Discharge Note   Patient Details  Name: ANNABELLA ELFORD MRN: [id number] Date of Birth: June 28, 1935  [facility]) CM/SW Contact:  Roseanne Kaufman, RN Phone Number: 05/19/2022, 1:16 PM   Clinical Narrative:   Damaris Schooner with Mickel Baas patient is a [facility] patient. Faxed FL2, discharge summary to Mickel Baas. Awaiting report number and room number.   TOC will continue to follow.   - 1:53p Spoke with Mickel Baas with [facility], reports can be called to Olivia Mackie [phone], notified RN.  [facility] has been called. TOC will continue to follow.  Final next level of care: Assisted Living Barriers to Discharge: Continued Medical Work up   Patient Goals and CMS Choice Patient states their goals for this hospitalization and ongoing recovery are:: Patient wants to return to [place] with home health services. CMS Medicare.gov Compare Post Acute Care list provided to:: Patient Represenative (must comment) Loletha Carrow (daughter) [phone]) Choice offered to / list presented to : Adult Children  Discharge Placement                       Discharge Plan and Services In-house Referral: NA Discharge Planning Services: CM Consult Post Acute Care Choice: NA          DME Arranged: N/A DME Agency: NA       HH Arranged: PT, OT, Nurse's Aide          Social Determinants of Health (SDOH) Interventions     Readmission Risk Interventions     No data to display

## 2022-05-19 NOTE — TOC Initial Note (Addendum)
Transition of Care [facility]) - Initial/Assessment Note    Patient Details  Name: [name] MRN: [id number] Date of Birth: October 19, 1934  [facility]) CM/SW Contact:    Roseanne Kaufman, RN Phone Number: 05/19/2022, 10:41 AM  Clinical Narrative:    Received TOC consult for [facility]. PT has recommended short term SNF, however patient's daughter Loletha Carrow reports patient declines SNF wants to return to [place] with husband as patient's husband is currently on hospice services.  This RNCM provided a list of private duty nursing vendors and will make a referral to [facility] for HHPT/OT, HHA per family's request. Patient will be transported via safe transport at discharge.   TOC will continue to follow.                - 11:19am unsuccessful attempt to speak with patient, OT was working with patient at bedside. Spoke with patient's daughter Loletha Carrow to report [facility] Lemmings will follow at discharge. Vickie will work to get patient's wheelchair to the hospital prior to safe transport picking her up. Per daughter request patient not be transported via ambulance.  - 12:01p, spoke with patient's daughter to advise for safety patient needs to be transported via [place]. [name] verbalized understanding and agreed to [facility]. Awaiting discharge note, notified MD if medically stable.    Expected Discharge Plan: Assisted Living Barriers to Discharge: Continued Medical Work up   Patient Goals and CMS Choice Patient states their goals for this hospitalization and ongoing recovery are:: Patient wants to return to [place] with home health services. CMS Medicare.gov Compare Post Acute Care list provided to:: Patient Represenative (must comment) Loletha Carrow (daughter) [phone]) Choice offered to / list presented to : Adult Children  Expected Discharge Plan and Services Expected Discharge Plan: Assisted Living In-house Referral: NA Discharge Planning Services: CM Consult Post  Acute Care Choice: NA Living arrangements for the past 2 months: Apartment Expected Discharge Date: 05/19/22               DME Arranged: N/A DME Agency: NA       HH Arranged: PT, OT, Nurse's Aide          Prior Living Arrangements/Services Living arrangements for the past 2 months: Apartment Lives with:: Spouse Patient language and need for interpreter reviewed:: Yes Do you feel safe going back to the place where you live?: Yes      Need for Family Participation in Patient Care: No (Comment) Care giver support system in place?: Yes (comment) Current home services: Other (comment) (n/a) Criminal Activity/Legal Involvement Pertinent to Current Situation/Hospitalization: No - Comment as needed  Activities of Daily Living Home Assistive Devices/Equipment: Wheelchair ADL Screening (condition at time of admission) Patient's cognitive ability adequate to safely complete daily activities?: Yes Is the patient deaf or have difficulty hearing?: Yes Does the patient have difficulty seeing, even when wearing glasses/contacts?: No Does the patient have difficulty concentrating, remembering, or making decisions?: No Patient able to express need for assistance with ADLs?: Yes Does the patient have difficulty dressing or bathing?: Yes Independently performs ADLs?: No Communication: Independent Dressing (OT): Appropriate for developmental age Grooming: Appropriate for developmental age Feeding: Appropriate for developmental age Bathing: Appropriate for developmental age 29: Appropriate for developmental age, Needs assistance Is this a change from baseline?: Pre-admission baseline In/Out Bed: Needs assistance Is this a change from baseline?: Pre-admission baseline Walks in Home: Needs assistance Is this a change from baseline?: Pre-admission baseline Does the patient have difficulty walking  or climbing stairs?: Yes Weakness of Legs: Both Weakness of Arms/Hands: Left  Permission  Sought/Granted Permission sought to share information with : Case Manager Permission granted to share information with : Yes, Verbal Permission Granted  Share Information with NAME: Case manager           Emotional Assessment Appearance:: Appears stated age Attitude/Demeanor/Rapport: Engaged Affect (typically observed): Accepting Orientation: : Oriented to Self, Oriented to Place, Oriented to  Time Alcohol / Substance Use: Not Applicable Psych Involvement: No (comment)  Admission diagnosis:  Fall [W19.XXXA] Closed fracture of multiple ribs, unspecified laterality, initial encounter [S22.49XA] Closed fracture of transverse process of thoracic vertebra, initial encounter ([place]) [S22.009A] Compression fracture of body of thoracic vertebra ([place]) [S22.000A] Patient Active Problem List   Diagnosis Date Noted   Multiple rib fractures 05/18/2022   Thoracic spine fracture ([place]) 05/18/2022   Acute back pain 05/18/2022   Stage 3b chronic kidney disease (CKD) ([place]) 05/18/2022   Fall 05/17/2022   IBS (irritable bowel syndrome) 10/28/2020   Tremor of both hands 07/30/2020   Left arm swelling 04/10/2020   Aortic atherosclerosis ([place]) 03/30/2020   Cellulitis of arm, left 03/30/2020   Chronic thoracic back pain 02/03/2020   Iron deficiency anemia 02/02/2020   Near syncope 09/27/2019   Hypertension 09/20/2019   Acute pain of right knee 08/03/2019   (HFpEF) heart failure with preserved ejection fraction ([place]) 08/02/2019   Pacemaker 07/19/2019   CKD (chronic kidney disease) 01/30/2019   Lumbar spinal stenosis 01/10/2018   Degenerative arthritis of knee, bilateral 12/15/2017   Epigastric pain 09/09/2017   Neuralgia 09/09/2017   Claudication of calf muscles ([place]) 11/22/2016   DOE (dyspnea on exertion) 11/22/2016   Hyperreflexia of lower extremity 07/16/2016   Bilateral leg paresthesia 07/16/2016   Carotid arterial disease ([place]) 07/13/2016   Poor balance 06/23/2016   Weakness of both lower  extremities 06/23/2016   Sinus node dysfunction (HCC) 05/05/2016   Right shoulder pain 03/14/2016   Multinodular goiter (nontoxic) 02/21/2016   Neck pain 02/15/2016   Prediabetes 12/17/2015   Dysphagia 12/17/2015   Anxiety 11/13/2015   T8 vertebral fracture ([place]) 04/09/2015   Overweight 10/09/2011   Allergic rhinitis, cause unspecified    GERD (gastroesophageal reflux disease) 02/05/2011   Vitamin B 12 deficiency 04/17/2009   Hyperlipidemia 04/17/2009   Osteoporosis 04/17/2009   ADENOCARCINOMA, BREAST, HX OF 04/17/2009   Constipation 03/08/2009   COLONIC POLYPS, HYPERPLASTIC, HX OF 03/06/2009   PCP:  Pcp, No Pharmacy:   [facility], [address] [address] Phone: [phone] Fax: [phone]     Social Determinants of Health (SDOH) Interventions    Readmission Risk Interventions     No data to display

## 2022-05-19 NOTE — Evaluation (Signed)
Occupational Therapy Evaluation Patient Details Name: [name] MRN: [id number] DOB: [birth year] Today's Date: 05/19/2022   History of Present Illness 87 y.o. female with medical history significant of hypertension, diastolic congestive heart failure, Parkinson disease, hyperlipidemia, anxiety who presented from assisted living facility for the evaluation of fall.  Patient usually ambulates with the help of wheelchair/walker.  She was about to go to her bathroom for wheelchair but her wheelchair got away and see here in the wall and fell on her right side.  Per notes, Imaging: "Showed chronic T8 compression fracture.  Pelvic x-ray did not show any fracture.  CT thoracic spine showed acute-to-subacute appearing mild superior endplate compression fractures of the T2 and T4 vertebral bodies within the thoracic spine as well as a mild inferior endplate compression fracture of C7 within the imaged lower cervical spine. Nondisplaced fracture through the right transverse processes of  the T7, T8, T9, and T10 vertebrae. Nondisplaced fractures of the posterior right sixth, eighth, and ninth ribs.  TLSO brace ordered.  Patient admitted for the pain management and PT/OT evaluation"   Clinical Impression   [name] is an 87 year old female with above medical history. Prior to hospitalization patient at [facility] assisted living, requiring mod assistance for dressing and bathing. Patient used w/c to mobilize due to poor balance, but able to transfer to toilet/bed independently. Patient now presents with pain, decreased activity tolerance, poor balance, and generalized weakness. Patient max +2 for rolling, and Mod A for sidelying to sitting. Patient min a for sit to stand and mod A for ambulation to chair with walker- requiring verbal cueing for walker management and foot placement. Patient declined wearing TLSO stating it chokes her. Patient is intermittently confused, believing she is at [facility] and  asking to see her husband. Patient is Max A for LB tasks and Total A for upper body dressing. Patient requires increased assistance at discharge. Recommend SNF to return to [place].      Recommendations for follow up therapy are one component of a multi-disciplinary discharge planning process, led by the attending physician.  Recommendations may be updated based on patient status, additional functional criteria and insurance authorization.   Follow Up Recommendations  Skilled nursing-short term rehab (<3 hours/day)    Assistance Recommended at Discharge Frequent or constant Supervision/Assistance  Patient can return home with the following A lot of help with walking and/or transfers;A lot of help with bathing/dressing/bathroom;Assist for transportation;Direct supervision/assist for financial management;Assistance with cooking/housework;Help with stairs or ramp for entrance;Direct supervision/assist for medications management    Functional Status Assessment  Patient has had a recent decline in their functional status and demonstrates the ability to make significant improvements in function in a reasonable and predictable amount of time.  Equipment Recommendations  None recommended by OT    Recommendations for Other Services       Precautions / Restrictions Precautions Precautions: Fall;Back Precaution Comments: utilizing log roll technique, pt reports she doesn't think she can tolerate TLSO Required Braces or Orthoses: Spinal Brace Spinal Brace: Thoracolumbosacral orthotic Restrictions Weight Bearing Restrictions: No      Mobility Bed Mobility Overal bed mobility: Needs Assistance Bed Mobility: Rolling, Sidelying to Sit Rolling: Max assist, +2 for physical assistance Sidelying to sit: Mod assist       General bed mobility comments: verbal cues for log roll technique, cues for hand placement to self assist with rail, light assist for LEs to return to bed upon return to sidelying  Transfers Overall transfer level: Needs assistance Equipment used: Rolling walker (2 wheels) Transfers: Sit to/from Stand Sit to Stand: Min assist                  Balance Overall balance assessment: Needs assistance Sitting-balance support: No upper extremity supported, Feet supported Sitting balance-Leahy Scale: Fair     Standing balance support: Bilateral upper extremity supported, During functional activity, Reliant on assistive device for balance Standing balance-Leahy Scale: Poor                             ADL either performed or assessed with clinical judgement   ADL Overall ADL's : Needs assistance/impaired Eating/Feeding: Set up   Grooming: Set up   Upper Body Bathing: Minimal assistance   Lower Body Bathing: Maximal assistance   Upper Body Dressing : Total assistance Upper Body Dressing Details (indicate cue type and reason): pt. unable tolerate TLSO, requires assistance donning shirts due to bra Lower Body Dressing: Maximal assistance   Toilet Transfer: Minimal assistance;Regular Toilet;Rolling walker (2 wheels)   Toileting- Clothing Manipulation and Hygiene: Sit to/from stand;Maximal assistance       Functional mobility during ADLs: Moderate assistance       Vision   Vision Assessment?: No apparent visual deficits     Perception     Praxis      Pertinent Vitals/Pain Pain Assessment Pain Assessment: 0-10 Pain Score: 10-Worst pain ever Pain Location: back, reports 10/10 pain with movement but stated " I must not be a 10/10 if I am able to sit EOB". Pain Intervention(s): Monitored during session     Hand Dominance Right   Extremity/Trunk Assessment Upper Extremity Assessment Upper Extremity Assessment: RUE deficits/detail;LUE deficits/detail RUE Deficits / Details: ROM~65 degrees flexion RUE Sensation: WNL RUE Coordination: WNL LUE Deficits / Details: ROM~65 degrees flexion LUE Sensation: WNL LUE Coordination: WNL    Lower Extremity Assessment Lower Extremity Assessment: Defer to PT evaluation       Communication Communication Communication: No difficulties   Cognition Arousal/Alertness: Awake/alert Behavior During Therapy: Flat affect Overall Cognitive Status: No family/caregiver present to determine baseline cognitive functioning                                 General Comments: AOx3, able to state the year and president. However after therapy stated she is at her Assisted living facility, and wanted to see her husband.     General Comments       Exercises     Shoulder Instructions      Home Living Family/patient expects to be discharged to:: Assisted living                             Home Equipment: Wheelchair - power;Wheelchair - Education administrator (comment)   Additional Comments: lives in ALF with spouse but he has poor health      Prior Functioning/Environment Prior Level of Function : Needs assist       Physical Assist : ADLs (physical)     Mobility Comments: states she is able to get into/out of w/c from bed or toilet without assist; typically uses w/c for mobility due to poor balance ADLs Comments: an aide will come when she pulls the cord to assist with bathing/dressing        OT Problem List: Decreased strength;Decreased knowledge  of use of DME or AE;Decreased range of motion;Decreased activity tolerance;Impaired UE functional use;Pain;Decreased safety awareness;Impaired balance (sitting and/or standing)      OT Treatment/Interventions: Self-care/ADL training;Therapeutic exercise;Patient/family education;DME and/or AE instruction;Therapeutic activities;Balance training    OT Goals(Current goals can be found in the care plan section) Acute Rehab OT Goals OT Goal Formulation: Patient unable to participate in goal setting ADL Goals Pt Will Transfer to Toilet: with supervision Pt Will Perform Toileting - Clothing Manipulation and hygiene: with  min assist;sit to/from stand Additional ADL Goal #1: Patient will donn TLSO brace with moderate assistance  OT Frequency: Min 2X/week    Co-evaluation              AM-PAC OT "6 Clicks" Daily Activity     Outcome Measure Help from another person eating meals?: A Little Help from another person taking care of personal grooming?: A Little Help from another person toileting, which includes using toliet, bedpan, or urinal?: Total Help from another person bathing (including washing, rinsing, drying)?: A Lot Help from another person to put on and taking off regular upper body clothing?: Total Help from another person to put on and taking off regular lower body clothing?: A Lot 6 Click Score: 12   End of Session Equipment Utilized During Treatment: Gait belt;Rolling walker (2 wheels) Nurse Communication: Mobility status (pt. declined TLSO, and moved well with therpy. Still very confused and believed she was at assisted living facility.)  Activity Tolerance: Patient tolerated treatment well Patient left: in chair;with call bell/phone within reach;with chair alarm set  OT Visit Diagnosis: History of falling (Z91.81);Pain;Muscle weakness (generalized) (M62.81);Unsteadiness on feet (R26.81)                Time: 1040-1100 OT Time Calculation (min): 20 min Charges:  OT General Charges $OT Visit: 1 Visit OT Evaluation $OT Eval Low Complexity: [place], OTS Acute rehab services   Charlann Lange 05/19/2022, 12:00 PM

## 2022-05-19 NOTE — Discharge Summary (Signed)
Physician Discharge Summary  [name] MCN:[id number] DOB: [birth year] DOA: 05/17/2022  PCP: Pcp, No  Admit date: 05/17/2022 Discharge date: 05/19/2022 Admitted From: ILF Disposition:  ILF Recommendations for Outpatient Follow-up:  Follow up with PCP in one to two weeks Check CMp and CBC in one week Please follow up on the following pending results: None  Home Health: HHPT/OT Equipment/Devices: None  Discharge Condition: Stable CODE STATUS: Full code  Follow-up Information     [facility].   Specialty: [place] Why: A represerntative with Alvis Lemmings will contact you within 24-48 hours after discharge from the hositpal regarding: home health services ( physical therapy, occupational therapy, home health aide, RN) Contact information: [address] [place] [address] [phone]                 Hospital course 87 year old F with PMH of Parkinson's disease, diastolic CHF, HTN, HLD, anxiety and T8 compression fracture brought to ED after she had accidental fall at ALF.  Patient was about to go to her bathroom when her wheelchair got away and she fell against the wall on the right side.  She bruised the right side of her head and right elbow and complains some upper back pain.  In ED, CT head, CXR and pelvic x-ray without acute finding.  CT cervical spine showed acute to subacute appearing mild superior endplate compression fractures of the T2 and T4 vertebral bodies, mild inferior endplate compression fracture of C7 and not displaced fractures through the right transverse process of T7, T8, T9 and T10, and nondisplaced fractures of posterior right sixth, eighth and ninth ribs.  TLSO brace ordered.  Patient was admitted for pain management and PT/OT evaluation.  Patient remained stable.  Pain improved.  She is discharged on scheduled Tylenol with as needed oxycodone for pain.  Repeat recommended SNF.  Patient is discharged  back to ALF with home health.  Family to hire private duty nursing.   See individual problem list below for more.   Problems addressed during this hospitalization Principal Problem:   Fall Active Problems:   Hyperlipidemia   Osteoporosis   Sinus node dysfunction (HCC)   Poor balance   CKD (chronic kidney disease)   Pacemaker   (HFpEF) heart failure with preserved ejection fraction (HCC)   Multiple rib fractures   Thoracic spine fracture (HCC)   Acute back pain   Stage 3b chronic kidney disease (CKD) ([place])   Accidental fall at the nursing home: wheelchair-bound.  Wheelchair moved away and she fell on the right side Acute to subacute T2 and T4 mild superior endplate compression fracture Acute nondisplaced T7-T10 right transverse fractures Acute right sixth, eighth and ninth rib fracture History of osteoporosis: On Prolia. -Pain improved. -TLSO brace as needed but patient refuses -Schedule Tylenol while awake for about a week and then as needed -Oxycodone 5 mg every 8 hours as needed moderate to severe pain -Incentive spirometry -Fall precaution -HH PT/OT   Hyperlipidemia: Takes Zetia   Hypertension: BP within acceptable range.  Not on medication.  No longer takes metoprolol.   History of anxiety:  -On Lexapro, BuSpar and Xanax.  Noted that she is taking BuSpar as needed which might not be effective as needed.   History of GERD:  -Continue PPI   Chronic diastolic CHF: Currently appears near euvolemic but has trace lower extremity edema.   -Continue torsemide 5 daily that she takes at home.   History of Parkinson  disease.  Does not seem to be on medication for this.   Sick sinus syndrome s/p PPM.  Family communication: Attempted to call patient's daughter, Loletha Carrow [phone] (cell) but no answer.  Reportedly, Vickie is at hospice home with a father.           Vital signs Vitals:   05/18/22 0423 05/18/22 0826 05/18/22 2002 05/19/22 0421  BP: (!) 153/53 (!) 141/51  (!) 151/58 (!) 140/49  Pulse: 72 81 81 77  Temp: 98.4 F (36.9 C) 98.5 F (36.9 C) 98.2 F (36.8 C) 98 F (36.7 C)  Resp: '16 18 20   '$ Height:      Weight:      SpO2: 93% 97% 94% 94%  TempSrc: Oral Oral Oral Oral  BMI (Calculated):         Discharge exam  GENERAL: No apparent distress.  Nontoxic. HEENT: MMM.  Vision and hearing grossly intact.  NECK: Supple.  No apparent JVD.  RESP:  No IWOB.  Fair aeration bilaterally. CVS:  RRR. Heart sounds normal.  ABD/GI/GU: BS+. Abd soft, NTND.  MSK/EXT:  Moves extremities. No apparent deformity. No edema.  SKIN: no apparent skin lesion or wound NEURO: Awake and alert. Oriented appropriately but is slow when she talks.  No apparent focal neuro deficit. PSYCH: Calm. Normal affect.   Discharge Instructions Discharge Instructions     Diet general   Complete by: As directed    Increase activity slowly   Complete by: As directed    No wound care   Complete by: As directed       Allergies as of 05/19/2022       Reactions   Statins Other (See Comments)   Lipitor caused hospitalization - - depletion of electrolytes, fever, nausea, loss of appetite, couldn't get out of bed   Cymbalta [duloxetine Hcl] Other (See Comments)   Dulled her too much, difficulty urinating, change in vision- "Allergic," per [facility]   Fosamax [alendronate Sodium] Other (See Comments)   Caused chronic issues swallowing   Amitriptyline Other (See Comments)   Hallucinations   Fosamax [alendronate] Other (See Comments)   "Allergic," per [name]   Lyrica [pregabalin] Other (See Comments)   "Allergic," per [name]   Ranitidine Other (See Comments)   "Allergic," per [name]   Neurontin [gabapentin] Other (See Comments)   Dizziness and sedation (patient is tolerating in lower dose)- "Allergic," per [name]   Zanaflex [tizanidine] Other (See Comments)   Could not sleep        Medication List     STOP taking these medications    metoprolol succinate 25 MG 24 hr  tablet Commonly known as: Toprol XL       TAKE these medications    acetaminophen 325 MG tablet Commonly known as: TYLENOL Take 2 tablets (650 mg total) by mouth every 6 (six) hours for 7 days, THEN 2 tablets (650 mg total) every 6 (six) hours as needed. Start taking on: May 19, 2022 What changed:  medication strength See the new instructions.   ALPRAZolam 0.25 MG tablet Commonly known as: XANAX Take 0.25 mg by mouth 2 (two) times daily as needed for anxiety.   busPIRone 10 MG tablet Commonly known as: BUSPAR Take 1 tablet (10 mg total) by mouth 3 (three) times daily as needed. What changed: reasons to take this   cyanocobalamin 1000 MCG tablet Commonly known as: VITAMIN B12 Take 1,000 mcg by mouth in the morning.   denosumab 60 MG/ML Sosy injection Commonly known  as: PROLIA Inject 60 mg into the skin every 6 (six) months.   diclofenac Sodium 1 % Gel Commonly known as: VOLTAREN Apply 2 g topically 4 (four) times daily.   escitalopram 10 MG tablet Commonly known as: Lexapro Take one tablet daily What changed:  how much to take how to take this when to take this additional instructions   ezetimibe 10 MG tablet Commonly known as: ZETIA TAKE ONE TABLET BY MOUTH EVERY MORNING What changed: when to take this   ferrous sulfate 325 (65 FE) MG tablet Take 325 mg by mouth every Monday, Wednesday, and Friday.   Lubricant Eye Drops 0.4-0.3 % Soln Generic drug: Polyethyl Glycol-Propyl Glycol Place 1 drop into both eyes 3 (three) times daily as needed (dry/irritated eyes.).   omeprazole 40 MG capsule Commonly known as: PRILOSEC TAKE 1 CAPSULE BY MOUTH TWICE A DAY FOR GERD. What changed:  how much to take how to take this when to take this additional instructions   oxyCODONE 5 MG immediate release tablet Commonly known as: Oxy IR/ROXICODONE Take 1 tablet (5 mg total) by mouth every 8 (eight) hours as needed for up to 5 days for severe pain or breakthrough  pain.   polyethylene glycol 17 g packet Commonly known as: MIRALAX / GLYCOLAX Take 17 g by mouth daily as needed (constipation- MIX AND DRINK AS DIRECTED).   senna-docusate 8.6-50 MG tablet Commonly known as: Senokot-S Take 1 tablet by mouth 2 (two) times daily between meals as needed for mild constipation.   torsemide 5 MG tablet Commonly known as: DEMADEX Take 5 mg by mouth in the morning.   Vitamin D3 50 MCG (2000 UT) Tabs Take 2,000 Units by mouth in the morning.   Wheelchair Misc Power Wheelchair        Consultations: None  Procedures/Studies:   DG Pelvis 1-2 Views  Result Date: 05/17/2022 CLINICAL DATA:  Pain post fall EXAM: PELVIS - 1-2 VIEW COMPARISON:  CT 01/31/2018 FINDINGS: There is no evidence of pelvic fracture or diastasis. No pelvic bone lesions are seen. 8 mm calculus projects over the left renal collecting system as before. IMPRESSION: 1. No fracture or other acute finding 2. 8 mm left renal calculus. Electronically Signed   By: Lucrezia Europe M.D.   On: 05/17/2022 14:36   DG Chest 2 View  Result Date: 05/17/2022 CLINICAL DATA:  Per EMS- [facility].Patient was hurrying to go to the bathroom and fell out of her wheelchair. Patient hit the right side of her head, a skin tear to the right elbow, and c/o pain mid right back EXAM: CHEST - 2 VIEW COMPARISON:  02/24/2022 FINDINGS: Lungs are clear. Heart size and mediastinal contours are within normal limits. 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 Mitral annulus calcifications. Aortic Atherosclerosis (ICD10-170.0). No effusion.  No pneumothorax. Cervical fixation hardware. Chronic T8 vertebral compression deformity. The subacute T2 and T4 fractures seen on today's CT are less conspicuous. IMPRESSION: No acute cardiopulmonary disease. Electronically Signed   By: Lucrezia Europe M.D.   On: 05/17/2022 14:34   DG Elbow Complete Right  Result Date: 05/17/2022 CLINICAL DATA:   [place], skin laceration, pain EXAM: RIGHT ELBOW - COMPLETE 3+ VIEW COMPARISON:  None Available. FINDINGS: There is no evidence of fracture, dislocation, or joint effusion. There is no evidence of arthropathy or other focal bone abnormality. There is dorsal soft tissue swelling at the level of the distal humerus. IMPRESSION: Dorsal soft tissue swelling. No fracture or  other acute finding. Electronically Signed   By: Lucrezia Europe M.D.   On: 05/17/2022 14:22   CT Thoracic Spine Wo Contrast  Result Date: 05/17/2022 CLINICAL DATA:  Back pain after fall from wheelchair EXAM: CT THORACIC SPINE WITHOUT CONTRAST TECHNIQUE: Multidetector CT images of the thoracic were obtained using the standard protocol without intravenous contrast. RADIATION DOSE REDUCTION: This exam was performed according to the departmental dose-optimization program which includes automated exposure control, adjustment of the mA and/or kV according to patient size and/or use of iterative reconstruction technique. COMPARISON:  X-ray 02/03/2020.  CT 06/08/2017 FINDINGS: Alignment: Exaggerated thoracic kyphosis.  No traumatic listhesis. Vertebrae: Acute to subacute appearing mild superior endplate compression fractures of the T2 and T4 vertebral bodies within the thoracic spine as well as a mild inferior endplate compression fracture of C7 within the imaged lower cervical spine. Chronic severe inferior endplate compression fracture of T8 which is unchanged. Remaining thoracic vertebral body heights are maintained. Nondisplaced fracture through the right transverse processes of the T7, T8, T9, and T10 vertebrae (series 9, images 50 9-61). Nondisplaced fracture of the posterior right sixth rib. Nondisplaced fractures of the posterior right eighth and ninth ribs near the costovertebral junction (series 4, images 94 and 105). No lytic or sclerotic bone lesion. Paraspinal and other soft tissues: Aortic and coronary artery atherosclerosis. No pleural effusion or  pneumothorax. Nonobstructing stone within the left kidney. Disc levels: No significant spondylosis of the thoracic spine. IMPRESSION: 1. Acute-to-subacute appearing mild superior endplate compression fractures of the T2 and T4 vertebral bodies within the thoracic spine as well as a mild inferior endplate compression fracture of C7 within the imaged lower cervical spine. 2. Nondisplaced fracture through the right transverse processes of the T7, T8, T9, and T10 vertebrae. 3. Nondisplaced fractures of the posterior right sixth, eighth, and ninth ribs. 4. Chronic severe inferior endplate compression fracture of T8. 5. Aortic and coronary artery atherosclerosis (ICD10-I70.0). Electronically Signed   By: Davina Poke D.O.   On: 05/17/2022 14:10   CT Head Wo Contrast  Result Date: 05/17/2022 CLINICAL DATA:  Fall out of wheelchair. EXAM: CT HEAD WITHOUT CONTRAST CT CERVICAL SPINE WITHOUT CONTRAST TECHNIQUE: Multidetector CT imaging of the head and cervical spine was performed following the standard protocol without intravenous contrast. Multiplanar CT image reconstructions of the cervical spine were also generated. RADIATION DOSE REDUCTION: This exam was performed according to the departmental dose-optimization program which includes automated exposure control, adjustment of the mA and/or kV according to patient size and/or use of iterative reconstruction technique. COMPARISON:  February 24, 2021. FINDINGS: CT HEAD FINDINGS Brain: No evidence of acute infarction, hemorrhage, hydrocephalus, extra-axial collection or mass lesion/mass effect. Vascular: No hyperdense vessel or unexpected calcification. Skull: Normal. Negative for fracture or focal lesion. Sinuses/Orbits: No acute finding. Other: None. CT CERVICAL SPINE FINDINGS Alignment: Normal. Skull base and vertebrae: No acute fracture. No primary bone lesion or focal pathologic process. Soft tissues and spinal canal: No prevertebral fluid or swelling. No visible canal  hematoma. Disc levels: Status post surgical anterior fusion of C5-6. Moderate degenerative disc disease is noted at C6-7. Upper chest: Negative. Other: None. IMPRESSION: No acute intracranial abnormality seen. No acute abnormality seen in the cervical spine. Electronically Signed   By: Marijo Conception M.D.   On: 05/17/2022 14:03   CT Cervical Spine Wo Contrast  Result Date: 05/17/2022 CLINICAL DATA:  Fall out of wheelchair. EXAM: CT HEAD WITHOUT CONTRAST CT CERVICAL SPINE WITHOUT CONTRAST TECHNIQUE: Multidetector CT imaging of  the head and cervical spine was performed following the standard protocol without intravenous contrast. Multiplanar CT image reconstructions of the cervical spine were also generated. RADIATION DOSE REDUCTION: This exam was performed according to the departmental dose-optimization program which includes automated exposure control, adjustment of the mA and/or kV according to patient size and/or use of iterative reconstruction technique. COMPARISON:  February 24, 2021. FINDINGS: CT HEAD FINDINGS Brain: No evidence of acute infarction, hemorrhage, hydrocephalus, extra-axial collection or mass lesion/mass effect. Vascular: No hyperdense vessel or unexpected calcification. Skull: Normal. Negative for fracture or focal lesion. Sinuses/Orbits: No acute finding. Other: None. CT CERVICAL SPINE FINDINGS Alignment: Normal. Skull base and vertebrae: No acute fracture. No primary bone lesion or focal pathologic process. Soft tissues and spinal canal: No prevertebral fluid or swelling. No visible canal hematoma. Disc levels: Status post surgical anterior fusion of C5-6. Moderate degenerative disc disease is noted at C6-7. Upper chest: Negative. Other: None. IMPRESSION: No acute intracranial abnormality seen. No acute abnormality seen in the cervical spine. Electronically Signed   By: Marijo Conception M.D.   On: 05/17/2022 14:03   CUP PACEART REMOTE DEVICE CHECK  Result Date: 04/29/2022 Scheduled remote  reviewed. Normal device function.  Next remote 91 days. LA      The results of significant diagnostics from this hospitalization (including imaging, microbiology, ancillary and laboratory) are listed below for reference.     Microbiology: No results found for this or any previous visit (from the past 240 hour(s)).   Labs:  CBC: Recent Labs  Lab 05/17/22 1458 05/18/22 0414 05/19/22 0351  WBC 13.9* 8.0 8.7  NEUTROABS 11.7*  --   --   HGB 11.4* 10.9* 10.7*  HCT 34.5* 32.6* 32.9*  MCV 102.1* 100.9* 101.9*  PLT 299 279 264   BMP &GFR Recent Labs  Lab 05/17/22 1458 05/18/22 0414 05/19/22 0351  NA 139 137 140  K 3.9 3.5 3.6  CL 105 103 104  CO2 '25 26 25  '$ GLUCOSE 92 109* 120*  BUN '19 17 17  '$ CREATININE 1.59* 1.22* 1.19*  CALCIUM 9.3 9.0 9.3  MG  --   --  2.1  PHOS  --   --  4.6   Estimated Creatinine Clearance: 28.8 mL/min (A) (by C-G formula based on SCr of 1.19 mg/dL (H)). Liver & Pancreas: Recent Labs  Lab 05/19/22 0351  ALBUMIN 3.7   No results for input(s): "LIPASE", "AMYLASE" in the last 168 hours. No results for input(s): "AMMONIA" in the last 168 hours. Diabetic: No results for input(s): "HGBA1C" in the last 72 hours. No results for input(s): "GLUCAP" in the last 168 hours. Cardiac Enzymes: Recent Labs  Lab 05/18/22 0414 05/19/22 0351  CKTOTAL 50 53   No results for input(s): "PROBNP" in the last 8760 hours. Coagulation Profile: No results for input(s): "INR", "PROTIME" in the last 168 hours. Thyroid Function Tests: No results for input(s): "TSH", "T4TOTAL", "FREET4", "T3FREE", "THYROIDAB" in the last 72 hours. Lipid Profile: No results for input(s): "CHOL", "HDL", "LDLCALC", "TRIG", "CHOLHDL", "LDLDIRECT" in the last 72 hours. Anemia Panel: No results for input(s): "VITAMINB12", "FOLATE", "FERRITIN", "TIBC", "IRON", "RETICCTPCT" in the last 72 hours. Urine analysis:    Component Value Date/Time   COLORURINE YELLOW 02/24/2022 1938    APPEARANCEUR CLEAR 02/24/2022 1938   LABSPEC 1.009 02/24/2022 1938   PHURINE 5.5 02/24/2022 1938   GLUCOSEU NEGATIVE 02/24/2022 1938   HGBUR NEGATIVE 02/24/2022 1938   BILIRUBINUR NEGATIVE 02/24/2022 [place] NEGATIVE 02/24/2022 1938  PROTEINUR NEGATIVE 02/24/2022 1938   NITRITE NEGATIVE 02/24/2022 1938   LEUKOCYTESUR SMALL (A) 02/24/2022 1938   Sepsis Labs: Invalid input(s): "PROCALCITONIN", "LACTICIDVEN"   SIGNED:  Mercy Riding, MD  [facility] 05/19/2022, 12:22 PM

## 2022-05-20 DIAGNOSIS — M6281 Muscle weakness (generalized): Secondary | ICD-10-CM | POA: Diagnosis not present

## 2022-05-21 ENCOUNTER — Telehealth: Payer: Self-pay

## 2022-05-21 DIAGNOSIS — M6281 Muscle weakness (generalized): Secondary | ICD-10-CM | POA: Diagnosis not present

## 2022-05-21 NOTE — Telephone Encounter (Signed)
Device alert - Right ventricular pacing lead impedance out of range 2015 ohms, 10/24, switched to unipolar  Discussed with Dr. Lovena Le.  Need to determine if this is a permanent change to unipolar.  If it is, no action needed.  Will discuss with BS rep.

## 2022-05-21 NOTE — Telephone Encounter (Signed)
Discussed with BS rep.  He advises this change will be permanent, and will not change back unless programmed.  Per Dr. Marge Duncans action needed.

## 2022-05-22 DIAGNOSIS — S41112A Laceration without foreign body of left upper arm, initial encounter: Secondary | ICD-10-CM | POA: Diagnosis not present

## 2022-05-22 DIAGNOSIS — D649 Anemia, unspecified: Secondary | ICD-10-CM | POA: Diagnosis not present

## 2022-05-22 DIAGNOSIS — S2239XD Fracture of one rib, unspecified side, subsequent encounter for fracture with routine healing: Secondary | ICD-10-CM | POA: Diagnosis not present

## 2022-05-22 DIAGNOSIS — M6281 Muscle weakness (generalized): Secondary | ICD-10-CM | POA: Diagnosis not present

## 2022-05-22 DIAGNOSIS — G20A1 Parkinson's disease without dyskinesia, without mention of fluctuations: Secondary | ICD-10-CM | POA: Diagnosis not present

## 2022-05-22 DIAGNOSIS — M810 Age-related osteoporosis without current pathological fracture: Secondary | ICD-10-CM | POA: Diagnosis not present

## 2022-05-22 DIAGNOSIS — F0283 Dementia in other diseases classified elsewhere, unspecified severity, with mood disturbance: Secondary | ICD-10-CM | POA: Diagnosis not present

## 2022-05-22 DIAGNOSIS — I509 Heart failure, unspecified: Secondary | ICD-10-CM | POA: Diagnosis not present

## 2022-05-22 DIAGNOSIS — E785 Hyperlipidemia, unspecified: Secondary | ICD-10-CM | POA: Diagnosis not present

## 2022-05-22 DIAGNOSIS — I130 Hypertensive heart and chronic kidney disease with heart failure and stage 1 through stage 4 chronic kidney disease, or unspecified chronic kidney disease: Secondary | ICD-10-CM | POA: Diagnosis not present

## 2022-05-22 DIAGNOSIS — Z9181 History of falling: Secondary | ICD-10-CM | POA: Diagnosis not present

## 2022-05-22 DIAGNOSIS — F0284 Dementia in other diseases classified elsewhere, unspecified severity, with anxiety: Secondary | ICD-10-CM | POA: Diagnosis not present

## 2022-05-22 DIAGNOSIS — N1832 Chronic kidney disease, stage 3b: Secondary | ICD-10-CM | POA: Diagnosis not present

## 2022-05-23 DIAGNOSIS — I130 Hypertensive heart and chronic kidney disease with heart failure and stage 1 through stage 4 chronic kidney disease, or unspecified chronic kidney disease: Secondary | ICD-10-CM | POA: Diagnosis not present

## 2022-05-23 DIAGNOSIS — I509 Heart failure, unspecified: Secondary | ICD-10-CM | POA: Diagnosis not present

## 2022-05-23 DIAGNOSIS — F0284 Dementia in other diseases classified elsewhere, unspecified severity, with anxiety: Secondary | ICD-10-CM | POA: Diagnosis not present

## 2022-05-23 DIAGNOSIS — F0283 Dementia in other diseases classified elsewhere, unspecified severity, with mood disturbance: Secondary | ICD-10-CM | POA: Diagnosis not present

## 2022-05-23 DIAGNOSIS — S2239XD Fracture of one rib, unspecified side, subsequent encounter for fracture with routine healing: Secondary | ICD-10-CM | POA: Diagnosis not present

## 2022-05-23 DIAGNOSIS — G20A1 Parkinson's disease without dyskinesia, without mention of fluctuations: Secondary | ICD-10-CM | POA: Diagnosis not present

## 2022-05-25 DIAGNOSIS — G20A1 Parkinson's disease without dyskinesia, without mention of fluctuations: Secondary | ICD-10-CM | POA: Diagnosis not present

## 2022-05-25 DIAGNOSIS — I130 Hypertensive heart and chronic kidney disease with heart failure and stage 1 through stage 4 chronic kidney disease, or unspecified chronic kidney disease: Secondary | ICD-10-CM | POA: Diagnosis not present

## 2022-05-25 DIAGNOSIS — I509 Heart failure, unspecified: Secondary | ICD-10-CM | POA: Diagnosis not present

## 2022-05-25 DIAGNOSIS — S2239XD Fracture of one rib, unspecified side, subsequent encounter for fracture with routine healing: Secondary | ICD-10-CM | POA: Diagnosis not present

## 2022-05-25 DIAGNOSIS — F0284 Dementia in other diseases classified elsewhere, unspecified severity, with anxiety: Secondary | ICD-10-CM | POA: Diagnosis not present

## 2022-05-25 DIAGNOSIS — F0283 Dementia in other diseases classified elsewhere, unspecified severity, with mood disturbance: Secondary | ICD-10-CM | POA: Diagnosis not present

## 2022-05-26 DIAGNOSIS — S2239XD Fracture of one rib, unspecified side, subsequent encounter for fracture with routine healing: Secondary | ICD-10-CM | POA: Diagnosis not present

## 2022-05-26 DIAGNOSIS — F0284 Dementia in other diseases classified elsewhere, unspecified severity, with anxiety: Secondary | ICD-10-CM | POA: Diagnosis not present

## 2022-05-26 DIAGNOSIS — I509 Heart failure, unspecified: Secondary | ICD-10-CM | POA: Diagnosis not present

## 2022-05-26 DIAGNOSIS — F0283 Dementia in other diseases classified elsewhere, unspecified severity, with mood disturbance: Secondary | ICD-10-CM | POA: Diagnosis not present

## 2022-05-26 DIAGNOSIS — I130 Hypertensive heart and chronic kidney disease with heart failure and stage 1 through stage 4 chronic kidney disease, or unspecified chronic kidney disease: Secondary | ICD-10-CM | POA: Diagnosis not present

## 2022-05-26 DIAGNOSIS — G20A1 Parkinson's disease without dyskinesia, without mention of fluctuations: Secondary | ICD-10-CM | POA: Diagnosis not present

## 2022-05-27 DIAGNOSIS — I10 Essential (primary) hypertension: Secondary | ICD-10-CM | POA: Diagnosis not present

## 2022-05-27 DIAGNOSIS — D508 Other iron deficiency anemias: Secondary | ICD-10-CM | POA: Diagnosis not present

## 2022-05-28 DIAGNOSIS — F0283 Dementia in other diseases classified elsewhere, unspecified severity, with mood disturbance: Secondary | ICD-10-CM | POA: Diagnosis not present

## 2022-05-28 DIAGNOSIS — G20A1 Parkinson's disease without dyskinesia, without mention of fluctuations: Secondary | ICD-10-CM | POA: Diagnosis not present

## 2022-05-28 DIAGNOSIS — S2239XD Fracture of one rib, unspecified side, subsequent encounter for fracture with routine healing: Secondary | ICD-10-CM | POA: Diagnosis not present

## 2022-05-28 DIAGNOSIS — F0284 Dementia in other diseases classified elsewhere, unspecified severity, with anxiety: Secondary | ICD-10-CM | POA: Diagnosis not present

## 2022-05-28 DIAGNOSIS — N1832 Chronic kidney disease, stage 3b: Secondary | ICD-10-CM | POA: Diagnosis not present

## 2022-05-28 DIAGNOSIS — I130 Hypertensive heart and chronic kidney disease with heart failure and stage 1 through stage 4 chronic kidney disease, or unspecified chronic kidney disease: Secondary | ICD-10-CM | POA: Diagnosis not present

## 2022-05-28 DIAGNOSIS — F32A Depression, unspecified: Secondary | ICD-10-CM | POA: Diagnosis not present

## 2022-05-28 DIAGNOSIS — E785 Hyperlipidemia, unspecified: Secondary | ICD-10-CM | POA: Diagnosis not present

## 2022-05-28 DIAGNOSIS — D649 Anemia, unspecified: Secondary | ICD-10-CM | POA: Diagnosis not present

## 2022-05-28 DIAGNOSIS — I509 Heart failure, unspecified: Secondary | ICD-10-CM | POA: Diagnosis not present

## 2022-05-28 DIAGNOSIS — M810 Age-related osteoporosis without current pathological fracture: Secondary | ICD-10-CM | POA: Diagnosis not present

## 2022-05-28 DIAGNOSIS — F4320 Adjustment disorder, unspecified: Secondary | ICD-10-CM | POA: Diagnosis not present

## 2022-05-28 DIAGNOSIS — F411 Generalized anxiety disorder: Secondary | ICD-10-CM | POA: Diagnosis not present

## 2022-05-29 DIAGNOSIS — S2239XD Fracture of one rib, unspecified side, subsequent encounter for fracture with routine healing: Secondary | ICD-10-CM | POA: Diagnosis not present

## 2022-05-29 DIAGNOSIS — F0284 Dementia in other diseases classified elsewhere, unspecified severity, with anxiety: Secondary | ICD-10-CM | POA: Diagnosis not present

## 2022-05-29 DIAGNOSIS — F0283 Dementia in other diseases classified elsewhere, unspecified severity, with mood disturbance: Secondary | ICD-10-CM | POA: Diagnosis not present

## 2022-05-29 DIAGNOSIS — G20A1 Parkinson's disease without dyskinesia, without mention of fluctuations: Secondary | ICD-10-CM | POA: Diagnosis not present

## 2022-05-29 DIAGNOSIS — I509 Heart failure, unspecified: Secondary | ICD-10-CM | POA: Diagnosis not present

## 2022-05-29 DIAGNOSIS — I130 Hypertensive heart and chronic kidney disease with heart failure and stage 1 through stage 4 chronic kidney disease, or unspecified chronic kidney disease: Secondary | ICD-10-CM | POA: Diagnosis not present

## 2022-06-03 DIAGNOSIS — S2239XD Fracture of one rib, unspecified side, subsequent encounter for fracture with routine healing: Secondary | ICD-10-CM | POA: Diagnosis not present

## 2022-06-03 DIAGNOSIS — I509 Heart failure, unspecified: Secondary | ICD-10-CM | POA: Diagnosis not present

## 2022-06-03 DIAGNOSIS — F0284 Dementia in other diseases classified elsewhere, unspecified severity, with anxiety: Secondary | ICD-10-CM | POA: Diagnosis not present

## 2022-06-03 DIAGNOSIS — F0283 Dementia in other diseases classified elsewhere, unspecified severity, with mood disturbance: Secondary | ICD-10-CM | POA: Diagnosis not present

## 2022-06-03 DIAGNOSIS — G20A1 Parkinson's disease without dyskinesia, without mention of fluctuations: Secondary | ICD-10-CM | POA: Diagnosis not present

## 2022-06-03 DIAGNOSIS — I130 Hypertensive heart and chronic kidney disease with heart failure and stage 1 through stage 4 chronic kidney disease, or unspecified chronic kidney disease: Secondary | ICD-10-CM | POA: Diagnosis not present

## 2022-06-11 DIAGNOSIS — F411 Generalized anxiety disorder: Secondary | ICD-10-CM | POA: Diagnosis not present

## 2022-06-11 DIAGNOSIS — F32A Depression, unspecified: Secondary | ICD-10-CM | POA: Diagnosis not present

## 2022-06-11 DIAGNOSIS — F4321 Adjustment disorder with depressed mood: Secondary | ICD-10-CM | POA: Diagnosis not present

## 2022-06-12 DIAGNOSIS — Z9181 History of falling: Secondary | ICD-10-CM | POA: Diagnosis not present

## 2022-06-12 DIAGNOSIS — Z66 Do not resuscitate: Secondary | ICD-10-CM | POA: Diagnosis not present

## 2022-06-12 DIAGNOSIS — Z7189 Other specified counseling: Secondary | ICD-10-CM | POA: Diagnosis not present

## 2022-06-12 DIAGNOSIS — S41112A Laceration without foreign body of left upper arm, initial encounter: Secondary | ICD-10-CM | POA: Diagnosis not present

## 2022-06-13 DIAGNOSIS — I509 Heart failure, unspecified: Secondary | ICD-10-CM | POA: Diagnosis not present

## 2022-06-13 DIAGNOSIS — G20A1 Parkinson's disease without dyskinesia, without mention of fluctuations: Secondary | ICD-10-CM | POA: Diagnosis not present

## 2022-06-13 DIAGNOSIS — S2239XD Fracture of one rib, unspecified side, subsequent encounter for fracture with routine healing: Secondary | ICD-10-CM | POA: Diagnosis not present

## 2022-06-13 DIAGNOSIS — I130 Hypertensive heart and chronic kidney disease with heart failure and stage 1 through stage 4 chronic kidney disease, or unspecified chronic kidney disease: Secondary | ICD-10-CM | POA: Diagnosis not present

## 2022-06-13 DIAGNOSIS — F0284 Dementia in other diseases classified elsewhere, unspecified severity, with anxiety: Secondary | ICD-10-CM | POA: Diagnosis not present

## 2022-06-13 DIAGNOSIS — F0283 Dementia in other diseases classified elsewhere, unspecified severity, with mood disturbance: Secondary | ICD-10-CM | POA: Diagnosis not present

## 2022-06-16 ENCOUNTER — Telehealth: Payer: Self-pay

## 2022-06-16 DIAGNOSIS — G20A1 Parkinson's disease without dyskinesia, without mention of fluctuations: Secondary | ICD-10-CM | POA: Diagnosis not present

## 2022-06-16 DIAGNOSIS — I130 Hypertensive heart and chronic kidney disease with heart failure and stage 1 through stage 4 chronic kidney disease, or unspecified chronic kidney disease: Secondary | ICD-10-CM | POA: Diagnosis not present

## 2022-06-16 DIAGNOSIS — F0284 Dementia in other diseases classified elsewhere, unspecified severity, with anxiety: Secondary | ICD-10-CM | POA: Diagnosis not present

## 2022-06-16 DIAGNOSIS — S2239XD Fracture of one rib, unspecified side, subsequent encounter for fracture with routine healing: Secondary | ICD-10-CM | POA: Diagnosis not present

## 2022-06-16 DIAGNOSIS — F0283 Dementia in other diseases classified elsewhere, unspecified severity, with mood disturbance: Secondary | ICD-10-CM | POA: Diagnosis not present

## 2022-06-16 DIAGNOSIS — I509 Heart failure, unspecified: Secondary | ICD-10-CM | POA: Diagnosis not present

## 2022-06-16 NOTE — Telephone Encounter (Signed)
Pt is returning call. Requesting call back.  

## 2022-06-16 NOTE — Telephone Encounter (Signed)
Device alert for VT Episode occurred (V>A). EGM's show oversensing of ventricular lead noise, no recent hx noted, route to triage Known RV switch to unipolar in October.  Consulted with Joey with [employer] and recommends patient come into device clinic for programming, states he will program device. Attempted to contact patient as well as [facility] [place] [facility] [place]. Nurse was unavailable, left message with Langley Gauss to have patient or staff call back to make DC apt.   RV lead noise noted (RV paces 0% of time), patient is RA dependent although noise does not cross to RA lead. Lead switched to unipolar and alert was sent to Dr. Lovena Le 05/21/22 and no changes recommended.   Will wait to hear back from staff/patient to make device clinic apt. When apt made please consult with Joey as he will need to be present for programming.

## 2022-06-16 NOTE — Telephone Encounter (Signed)
Mickel Baas returning call  Best c/b- [phone]

## 2022-06-16 NOTE — Telephone Encounter (Signed)
Attempted to return call. No answer, LMTCB.

## 2022-06-16 NOTE — Telephone Encounter (Signed)
Attempted to return call twice. No answer, LMTCB.

## 2022-06-17 DIAGNOSIS — S2239XD Fracture of one rib, unspecified side, subsequent encounter for fracture with routine healing: Secondary | ICD-10-CM | POA: Diagnosis not present

## 2022-06-17 DIAGNOSIS — F0283 Dementia in other diseases classified elsewhere, unspecified severity, with mood disturbance: Secondary | ICD-10-CM | POA: Diagnosis not present

## 2022-06-17 DIAGNOSIS — G20A1 Parkinson's disease without dyskinesia, without mention of fluctuations: Secondary | ICD-10-CM | POA: Diagnosis not present

## 2022-06-17 DIAGNOSIS — F0284 Dementia in other diseases classified elsewhere, unspecified severity, with anxiety: Secondary | ICD-10-CM | POA: Diagnosis not present

## 2022-06-17 DIAGNOSIS — I509 Heart failure, unspecified: Secondary | ICD-10-CM | POA: Diagnosis not present

## 2022-06-17 DIAGNOSIS — I130 Hypertensive heart and chronic kidney disease with heart failure and stage 1 through stage 4 chronic kidney disease, or unspecified chronic kidney disease: Secondary | ICD-10-CM | POA: Diagnosis not present

## 2022-06-17 NOTE — Telephone Encounter (Signed)
Called patient to make device clinic apt. States she has to have transportation. Attempted to call [place] to arrange for patient. No answer and unable to leave VM. Phone continues to ring continually.

## 2022-06-17 NOTE — Telephone Encounter (Signed)
Patients daughter Janyth Contes called, [facility] apt made 06/18/22 @ 1:00 pm. Discussed location, date and time w/ verbal understanding.

## 2022-06-17 NOTE — Telephone Encounter (Signed)
[name] with [employer] made aware need assistance at DC apt.

## 2022-06-18 ENCOUNTER — Ambulatory Visit: Payer: Medicare Other | Attending: Interventional Cardiology

## 2022-06-18 DIAGNOSIS — I495 Sick sinus syndrome: Secondary | ICD-10-CM

## 2022-06-18 LAB — CUP PACEART INCLINIC DEVICE CHECK
Date Time Interrogation Session: 20231122134247
Implantable Lead Connection Status: 753985
Implantable Lead Connection Status: 753985
Implantable Lead Implant Date: 20171009
Implantable Lead Implant Date: 20171009
Implantable Lead Location: 753859
Implantable Lead Location: 753860
Implantable Lead Model: 5076
Implantable Lead Model: 7741
Implantable Lead Serial Number: 785430
Implantable Pulse Generator Implant Date: 20171009
Lead Channel Impedance Value: 1991 Ohm
Lead Channel Impedance Value: 403 Ohm
Lead Channel Pacing Threshold Amplitude: 0.5 V
Lead Channel Pacing Threshold Amplitude: 0.6 V
Lead Channel Pacing Threshold Pulse Width: 1 ms
Lead Channel Pacing Threshold Pulse Width: 1 ms
Lead Channel Sensing Intrinsic Amplitude: 10.4 mV
Lead Channel Sensing Intrinsic Amplitude: 2.5 mV
Lead Channel Setting Pacing Amplitude: 2 V
Lead Channel Setting Pacing Amplitude: 2.6 V
Lead Channel Setting Pacing Pulse Width: 1 ms
Lead Channel Setting Sensing Sensitivity: 2.5 mV
Pulse Gen Serial Number: 766467
Zone Setting Status: 755011

## 2022-06-18 NOTE — Progress Notes (Signed)
Device check in clinic due to noise on RV lead / polarity switch. No noise noted with pocket stimulation or isometrics.  R waves bipolar 9-28m, threshold bipolar 0.680m@ 1.0061mbipolar impedance 1991-2000 normal per patient trends. Patient RVP <1% of the time. Sensing and pacing on RV lead programmed to bipolar, max pace impedance programmed from 2000ohms to 2750ohms.

## 2022-06-20 DIAGNOSIS — I130 Hypertensive heart and chronic kidney disease with heart failure and stage 1 through stage 4 chronic kidney disease, or unspecified chronic kidney disease: Secondary | ICD-10-CM | POA: Diagnosis not present

## 2022-06-20 DIAGNOSIS — G20A1 Parkinson's disease without dyskinesia, without mention of fluctuations: Secondary | ICD-10-CM | POA: Diagnosis not present

## 2022-06-20 DIAGNOSIS — F0284 Dementia in other diseases classified elsewhere, unspecified severity, with anxiety: Secondary | ICD-10-CM | POA: Diagnosis not present

## 2022-06-20 DIAGNOSIS — I509 Heart failure, unspecified: Secondary | ICD-10-CM | POA: Diagnosis not present

## 2022-06-20 DIAGNOSIS — F0283 Dementia in other diseases classified elsewhere, unspecified severity, with mood disturbance: Secondary | ICD-10-CM | POA: Diagnosis not present

## 2022-06-20 DIAGNOSIS — S2239XD Fracture of one rib, unspecified side, subsequent encounter for fracture with routine healing: Secondary | ICD-10-CM | POA: Diagnosis not present

## 2022-06-24 DIAGNOSIS — F0283 Dementia in other diseases classified elsewhere, unspecified severity, with mood disturbance: Secondary | ICD-10-CM | POA: Diagnosis not present

## 2022-06-24 DIAGNOSIS — F0284 Dementia in other diseases classified elsewhere, unspecified severity, with anxiety: Secondary | ICD-10-CM | POA: Diagnosis not present

## 2022-06-24 DIAGNOSIS — G20A1 Parkinson's disease without dyskinesia, without mention of fluctuations: Secondary | ICD-10-CM | POA: Diagnosis not present

## 2022-06-24 DIAGNOSIS — I509 Heart failure, unspecified: Secondary | ICD-10-CM | POA: Diagnosis not present

## 2022-06-24 DIAGNOSIS — I130 Hypertensive heart and chronic kidney disease with heart failure and stage 1 through stage 4 chronic kidney disease, or unspecified chronic kidney disease: Secondary | ICD-10-CM | POA: Diagnosis not present

## 2022-06-24 DIAGNOSIS — S2239XD Fracture of one rib, unspecified side, subsequent encounter for fracture with routine healing: Secondary | ICD-10-CM | POA: Diagnosis not present

## 2022-06-25 ENCOUNTER — Ambulatory Visit (INDEPENDENT_AMBULATORY_CARE_PROVIDER_SITE_OTHER): Payer: Medicare Other | Admitting: Family Medicine

## 2022-06-25 ENCOUNTER — Ambulatory Visit (INDEPENDENT_AMBULATORY_CARE_PROVIDER_SITE_OTHER): Payer: Medicare Other

## 2022-06-25 VITALS — Ht 60.0 in | Wt 140.0 lb

## 2022-06-25 DIAGNOSIS — M48062 Spinal stenosis, lumbar region with neurogenic claudication: Secondary | ICD-10-CM | POA: Diagnosis not present

## 2022-06-25 DIAGNOSIS — M545 Low back pain, unspecified: Secondary | ICD-10-CM

## 2022-06-25 DIAGNOSIS — S22009A Unspecified fracture of unspecified thoracic vertebra, initial encounter for closed fracture: Secondary | ICD-10-CM | POA: Diagnosis not present

## 2022-06-25 DIAGNOSIS — R29898 Other symptoms and signs involving the musculoskeletal system: Secondary | ICD-10-CM | POA: Diagnosis not present

## 2022-06-25 DIAGNOSIS — S22060S Wedge compression fracture of T7-T8 vertebra, sequela: Secondary | ICD-10-CM | POA: Diagnosis not present

## 2022-06-25 DIAGNOSIS — I2510 Atherosclerotic heart disease of native coronary artery without angina pectoris: Secondary | ICD-10-CM | POA: Diagnosis not present

## 2022-06-25 MED ORDER — KETOROLAC TROMETHAMINE 30 MG/ML IJ SOLN
30.0000 mg | Freq: Once | INTRAMUSCULAR | Status: AC
  Administered 2022-06-25: 30 mg via INTRAMUSCULAR

## 2022-06-25 MED ORDER — METHYLPREDNISOLONE ACETATE 40 MG/ML IJ SUSP
40.0000 mg | Freq: Once | INTRAMUSCULAR | Status: AC
  Administered 2022-06-25: 40 mg via INTRAMUSCULAR

## 2022-06-25 NOTE — Assessment & Plan Note (Signed)
Concerned the patient could have another possible vertebral fracture.  This could be causing some weakness.  I do believe the patient is also failure to thrive.  Patient does have this in the house but I do think patient trying to be active with range of motion would be better than anything else at the moment.  [name] agrees with the plan.  Total time discussing this with patient, caregiver 44 minutes.

## 2022-06-25 NOTE — Progress Notes (Signed)
[place] Phone: [phone] Subjective:    I'm seeing this patient by the request  of:  Pcp, No  CC: Worsening leg pain  [id number]  [name] is a 87 y.o. female with past medical history significant for Parkinson's disease, congestive heart failure, hypertension and anxiety coming in with complaint of worsening leg pain.  Since we have seen patient patient has been in the emergency room twice.  Patient does have a compression fracture of the thoracic vertebrae in October.  Patient well as there did have a CT of the cervical spine showing his T2 and T4 vertebral fractures as well as even a C7 compression fracture.  Fractures of the right transverse process of T7-T10 as well as multiple rib fractures on the right side. She is in a lot of pain , she has had falls  May 17, 2022, Tuesday 06/24/2022 pain is down her legs from her thigh , she was given a brace from the ED , cervical collar   Patient is accompanied with daughter who states that patient is just in pain all the time.      Past Medical History:  Diagnosis Date   Allergy    Anemia    Cancer of left breast ([place]) 1978   CHF (congestive heart failure) ([place])    Chronic thoracic back pain    "T8; fracture; 03/2015; no OR" (05/05/2016)   GERD (gastroesophageal reflux disease)    Heart murmur    History of hiatal hernia    Hyperlipidemia    Hyperplastic colon polyp    Hypertension    IBS (irritable bowel syndrome)    Multiple thyroid nodules    Osteoporosis    T8 compression fx 03/2015    Personal history of radiation therapy    Presence of permanent cardiac pacemaker    Vitamin B12 deficiency    Past Surgical History:  Procedure Laterality Date   ANTERIOR CERVICAL DECOMP/DISCECTOMY FUSION  2008   C5-6   AUGMENTATION MAMMAPLASTY     BACK SURGERY     BREAST SURGERY     CARDIAC CATHETERIZATION  2001   EP IMPLANTABLE DEVICE N/A 05/05/2016    Procedure: Pacemaker Implant;  Surgeon: Evans Lance, MD;  Location: [facility];  Service: Cardiovascular;  Laterality: N/A;   EXCISIONAL HEMORRHOIDECTOMY  1984   With subsequent correction of surgery   INSERT / REPLACE / REMOVE PACEMAKER     INTRAVASCULAR PRESSURE WIRE/FFR STUDY N/A 10/07/2019   Procedure: INTRAVASCULAR PRESSURE WIRE/FFR STUDY;  Surgeon: Jettie Booze, MD;  Location: [facility];  Service: Cardiovascular;  Laterality: N/A;   KNEE ARTHROSCOPY Left    "meniscus tear"   LEFT HEART CATH AND CORONARY ANGIOGRAPHY N/A 10/07/2019   Procedure: LEFT HEART CATH AND CORONARY ANGIOGRAPHY;  Surgeon: Jettie Booze, MD;  Location: [facility];  Service: Cardiovascular;  Laterality: N/A;   MASTECTOMY Left 1978   PLACEMENT OF BREAST IMPLANTS Left 1981   REDUCTION MAMMAPLASTY Right    SHOULDER ARTHROSCOPY W/ ROTATOR CUFF REPAIR Left 02/2006   "tear"   TUBAL LIGATION     Social History   Socioeconomic History   Marital status: Married    Spouse name: Not on file   Number of children: 2   Years of education: Not on file   Highest education level: Not on file  Occupational History   Occupation: retired  Tobacco Use   Smoking  status: Never   Smokeless tobacco: Never  Vaping Use   Vaping Use: Never used  Substance and Sexual Activity   Alcohol use: No    Alcohol/week: 0.0 standard drinks of alcohol   Drug use: No   Sexual activity: Not Currently  Other Topics Concern   Not on file  Social History Narrative   Housewife, Lives with spouse, 2 children   Social Determinants of Health   Financial Resource Strain: Low Risk  (04/04/2021)   Overall Financial Resource Strain (CARDIA)    Difficulty of Paying Living Expenses: Not hard at all  Food Insecurity: No Food Insecurity (05/17/2022)   Hunger Vital Sign    Worried About Running Out of Food in the Last Year: Never true    Ran Out of Food in the Last Year: Never true  Transportation Needs:  No Transportation Needs (05/17/2022)   PRAPARE - Hydrologist (Medical): No    Lack of Transportation (Non-Medical): No  Physical Activity: Inactive (04/04/2021)   Exercise Vital Sign    Days of Exercise per Week: 0 days    Minutes of Exercise per Session: 0 min  Stress: No Stress Concern Present (04/04/2021)   [place]    Feeling of Stress : Not at all  Social Connections: Socially Integrated (04/04/2021)   Social Connection and Isolation Panel [NHANES]    Frequency of Communication with Friends and Family: More than three times a week    Frequency of Social Gatherings with Friends and Family: More than three times a week    Attends Religious Services: 1 to 4 times per year    Active Member of [employer] or Organizations: No    Attends Music therapist: 1 to 4 times per year    Marital Status: Married   Allergies  Allergen Reactions   Statins Other (See Comments)    Lipitor caused hospitalization - - depletion of electrolytes, fever, nausea, loss of appetite, couldn't get out of bed   Cymbalta [Duloxetine Hcl] Other (See Comments)    Dulled her too much, difficulty urinating, change in vision- "Allergic," per [facility]   Fosamax [Alendronate Sodium] Other (See Comments)    Caused chronic issues swallowing   Amitriptyline Other (See Comments)    Hallucinations    Fosamax [Alendronate] Other (See Comments)    "Allergic," per [facility]   Lyrica [Pregabalin] Other (See Comments)    "Allergic," per [name]   Ranitidine Other (See Comments)    "Allergic," per [name]   Neurontin [Gabapentin] Other (See Comments)    Dizziness and sedation (patient is tolerating in lower dose)- "Allergic," per [name]   Zanaflex [Tizanidine] Other (See Comments)    Could not sleep   Family History  Problem Relation Age of Onset   Stroke Sister    Diabetes Brother    Breast cancer Maternal Aunt    Atrial fibrillation  Sister    Colon cancer Neg Hx    Stomach cancer Neg Hx     Current Outpatient Medications (Endocrine & Metabolic):    denosumab (PROLIA) 60 MG/ML SOSY injection, Inject 60 mg into the skin every 6 (six) months.  Current Outpatient Medications (Cardiovascular):    ezetimibe (ZETIA) 10 MG tablet, TAKE ONE TABLET BY MOUTH EVERY MORNING (Patient taking differently: Take 10 mg by mouth in the morning.)   torsemide (DEMADEX) 5 MG tablet, Take 5 mg by mouth in the morning.   Current Outpatient Medications (Analgesics):  acetaminophen (TYLENOL) 325 MG tablet, Take 2 tablets (650 mg total) by mouth every 6 (six) hours for 7 days, THEN 2 tablets (650 mg total) every 6 (six) hours as needed.  Current Outpatient Medications (Hematological):    cyanocobalamin (VITAMIN B12) 1000 MCG tablet, Take 1,000 mcg by mouth in the morning.   ferrous sulfate 325 (65 FE) MG tablet, Take 325 mg by mouth every Monday, Wednesday, and Friday.  Current Outpatient Medications (Other):    ALPRAZolam (XANAX) 0.25 MG tablet, Take 0.25 mg by mouth 2 (two) times daily as needed for anxiety.   busPIRone (BUSPAR) 10 MG tablet, Take 1 tablet (10 mg total) by mouth 3 (three) times daily as needed. (Patient taking differently: Take 10 mg by mouth 3 (three) times daily as needed (for anxiety).)   Cholecalciferol (VITAMIN D3) 50 MCG (2000 UT) TABS, Take 2,000 Units by mouth in the morning.   diclofenac Sodium (VOLTAREN) 1 % GEL, Apply 2 g topically 4 (four) times daily.   escitalopram (LEXAPRO) 10 MG tablet, Take one tablet daily (Patient taking differently: Take 10 mg by mouth in the morning.)   Misc. Devices (WHEELCHAIR) MISC, Power Wheelchair   omeprazole (PRILOSEC) 40 MG capsule, TAKE 1 CAPSULE BY MOUTH TWICE A DAY FOR GERD. (Patient taking differently: Take 40 mg by mouth 2 (two) times daily before a meal.)   Polyethyl Glycol-Propyl Glycol (LUBRICANT EYE DROPS) 0.4-0.3 % SOLN, Place 1 drop into both eyes 3 (three) times  daily as needed (dry/irritated eyes.).   polyethylene glycol (MIRALAX / GLYCOLAX) packet, Take 17 g by mouth daily as needed (constipation- MIX AND DRINK AS DIRECTED).   senna-docusate (SENOKOT-S) 8.6-50 MG tablet, Take 1 tablet by mouth 2 (two) times daily between meals as needed for mild constipation.   Reviewed prior external information including notes and imaging from  primary care provider As well as notes that were available from care everywhere and other healthcare systems.  Past medical history, social, surgical and family history all reviewed in electronic medical record.  No pertanent information unless stated regarding to the chief complaint.   Review of Systems:  No headache, visual changes, nausea, vomiting, diarrhea, constipation, dizziness, abdominal pain, skin rash, fevers, chills, night sweats, weight loss, swollen lymph nodes, body aches, joint swelling, chest pain, shortness of breath, mood changes. POSITIVE muscle aches  Objective  Height 5' (1.524 m), weight 140 lb (63.5 kg).   General: alert not oriented patient does have masked facies noted.  The patient does have a resting tremor mostly in the right upper extremity.  Does have a look not being very comfortable.  Patient is accompanied with his daughter.  Patient has pain to even light palpation.   Negative straight leg test.  Does have some cogwheeling noted though. No back pain seems to be more worse than anything else.    Impression and Recommendations:     The above documentation has been reviewed and is accurate and complete Lyndal Pulley, DO

## 2022-06-25 NOTE — Patient Instructions (Signed)
Recommend Mirilax 17g daily, Colace '100mg'$  daily I think pain medications are correct Defer to hospice I do think PT for balance and coordination would be good if tolerated This will take 2-3 weeks to feel much better Follow-up in 4-5 weeks if needed but will not change management significantly Happy Holidays

## 2022-06-25 NOTE — Assessment & Plan Note (Signed)
Known lumbar spinal stenosis with worsening pain at the moment.  Patient has hyperreflexia and chronic muscular spasm secondary to patient's underlying Parkinson's.  Recent loss of husband and patient is on hospice at this point.  Multiple falls we do need to be careful with different types of medications.  We discussed different treatment options including Toradol and Depo-Medrol injections today.  Discussed the pain relievers that patient is getting with hospice and consider doing MiraLAX and Colace to help her with the chronic constipation that is secondary to this medication.  These would be recommendations.  I do highly think that getting patient to do something he had would be beneficial.  Discussed with him now that at this stage it is difficult for [place] to do anything else more aggressive that would be helping patient's quality of care would recommend continuing under the care of hospice.

## 2022-06-27 ENCOUNTER — Ambulatory Visit: Payer: Medicare Other | Admitting: Family Medicine

## 2022-06-27 DIAGNOSIS — D649 Anemia, unspecified: Secondary | ICD-10-CM | POA: Diagnosis not present

## 2022-06-27 DIAGNOSIS — M810 Age-related osteoporosis without current pathological fracture: Secondary | ICD-10-CM | POA: Diagnosis not present

## 2022-06-27 DIAGNOSIS — I130 Hypertensive heart and chronic kidney disease with heart failure and stage 1 through stage 4 chronic kidney disease, or unspecified chronic kidney disease: Secondary | ICD-10-CM | POA: Diagnosis not present

## 2022-06-27 DIAGNOSIS — F0284 Dementia in other diseases classified elsewhere, unspecified severity, with anxiety: Secondary | ICD-10-CM | POA: Diagnosis not present

## 2022-06-27 DIAGNOSIS — E785 Hyperlipidemia, unspecified: Secondary | ICD-10-CM | POA: Diagnosis not present

## 2022-06-27 DIAGNOSIS — S2239XD Fracture of one rib, unspecified side, subsequent encounter for fracture with routine healing: Secondary | ICD-10-CM | POA: Diagnosis not present

## 2022-06-27 DIAGNOSIS — N1832 Chronic kidney disease, stage 3b: Secondary | ICD-10-CM | POA: Diagnosis not present

## 2022-06-27 DIAGNOSIS — F0283 Dementia in other diseases classified elsewhere, unspecified severity, with mood disturbance: Secondary | ICD-10-CM | POA: Diagnosis not present

## 2022-06-27 DIAGNOSIS — I509 Heart failure, unspecified: Secondary | ICD-10-CM | POA: Diagnosis not present

## 2022-06-27 DIAGNOSIS — G20A1 Parkinson's disease without dyskinesia, without mention of fluctuations: Secondary | ICD-10-CM | POA: Diagnosis not present

## 2022-06-30 DIAGNOSIS — G20A1 Parkinson's disease without dyskinesia, without mention of fluctuations: Secondary | ICD-10-CM | POA: Diagnosis not present

## 2022-06-30 DIAGNOSIS — F0284 Dementia in other diseases classified elsewhere, unspecified severity, with anxiety: Secondary | ICD-10-CM | POA: Diagnosis not present

## 2022-06-30 DIAGNOSIS — S2239XD Fracture of one rib, unspecified side, subsequent encounter for fracture with routine healing: Secondary | ICD-10-CM | POA: Diagnosis not present

## 2022-06-30 DIAGNOSIS — I509 Heart failure, unspecified: Secondary | ICD-10-CM | POA: Diagnosis not present

## 2022-06-30 DIAGNOSIS — F0283 Dementia in other diseases classified elsewhere, unspecified severity, with mood disturbance: Secondary | ICD-10-CM | POA: Diagnosis not present

## 2022-06-30 DIAGNOSIS — I130 Hypertensive heart and chronic kidney disease with heart failure and stage 1 through stage 4 chronic kidney disease, or unspecified chronic kidney disease: Secondary | ICD-10-CM | POA: Diagnosis not present

## 2022-07-02 DIAGNOSIS — F0284 Dementia in other diseases classified elsewhere, unspecified severity, with anxiety: Secondary | ICD-10-CM | POA: Diagnosis not present

## 2022-07-02 DIAGNOSIS — F0283 Dementia in other diseases classified elsewhere, unspecified severity, with mood disturbance: Secondary | ICD-10-CM | POA: Diagnosis not present

## 2022-07-02 DIAGNOSIS — S2239XD Fracture of one rib, unspecified side, subsequent encounter for fracture with routine healing: Secondary | ICD-10-CM | POA: Diagnosis not present

## 2022-07-02 DIAGNOSIS — I509 Heart failure, unspecified: Secondary | ICD-10-CM | POA: Diagnosis not present

## 2022-07-02 DIAGNOSIS — I130 Hypertensive heart and chronic kidney disease with heart failure and stage 1 through stage 4 chronic kidney disease, or unspecified chronic kidney disease: Secondary | ICD-10-CM | POA: Diagnosis not present

## 2022-07-02 DIAGNOSIS — G20A1 Parkinson's disease without dyskinesia, without mention of fluctuations: Secondary | ICD-10-CM | POA: Diagnosis not present

## 2022-07-08 DIAGNOSIS — F0283 Dementia in other diseases classified elsewhere, unspecified severity, with mood disturbance: Secondary | ICD-10-CM | POA: Diagnosis not present

## 2022-07-08 DIAGNOSIS — F0284 Dementia in other diseases classified elsewhere, unspecified severity, with anxiety: Secondary | ICD-10-CM | POA: Diagnosis not present

## 2022-07-08 DIAGNOSIS — I509 Heart failure, unspecified: Secondary | ICD-10-CM | POA: Diagnosis not present

## 2022-07-08 DIAGNOSIS — S2239XD Fracture of one rib, unspecified side, subsequent encounter for fracture with routine healing: Secondary | ICD-10-CM | POA: Diagnosis not present

## 2022-07-08 DIAGNOSIS — G20A1 Parkinson's disease without dyskinesia, without mention of fluctuations: Secondary | ICD-10-CM | POA: Diagnosis not present

## 2022-07-08 DIAGNOSIS — I130 Hypertensive heart and chronic kidney disease with heart failure and stage 1 through stage 4 chronic kidney disease, or unspecified chronic kidney disease: Secondary | ICD-10-CM | POA: Diagnosis not present

## 2022-07-11 DIAGNOSIS — I130 Hypertensive heart and chronic kidney disease with heart failure and stage 1 through stage 4 chronic kidney disease, or unspecified chronic kidney disease: Secondary | ICD-10-CM | POA: Diagnosis not present

## 2022-07-11 DIAGNOSIS — S2239XD Fracture of one rib, unspecified side, subsequent encounter for fracture with routine healing: Secondary | ICD-10-CM | POA: Diagnosis not present

## 2022-07-11 DIAGNOSIS — I509 Heart failure, unspecified: Secondary | ICD-10-CM | POA: Diagnosis not present

## 2022-07-11 DIAGNOSIS — F0283 Dementia in other diseases classified elsewhere, unspecified severity, with mood disturbance: Secondary | ICD-10-CM | POA: Diagnosis not present

## 2022-07-11 DIAGNOSIS — F0284 Dementia in other diseases classified elsewhere, unspecified severity, with anxiety: Secondary | ICD-10-CM | POA: Diagnosis not present

## 2022-07-11 DIAGNOSIS — G20A1 Parkinson's disease without dyskinesia, without mention of fluctuations: Secondary | ICD-10-CM | POA: Diagnosis not present

## 2022-07-14 DIAGNOSIS — S2239XD Fracture of one rib, unspecified side, subsequent encounter for fracture with routine healing: Secondary | ICD-10-CM | POA: Diagnosis not present

## 2022-07-14 DIAGNOSIS — F0284 Dementia in other diseases classified elsewhere, unspecified severity, with anxiety: Secondary | ICD-10-CM | POA: Diagnosis not present

## 2022-07-14 DIAGNOSIS — I130 Hypertensive heart and chronic kidney disease with heart failure and stage 1 through stage 4 chronic kidney disease, or unspecified chronic kidney disease: Secondary | ICD-10-CM | POA: Diagnosis not present

## 2022-07-14 DIAGNOSIS — I509 Heart failure, unspecified: Secondary | ICD-10-CM | POA: Diagnosis not present

## 2022-07-14 DIAGNOSIS — G20A1 Parkinson's disease without dyskinesia, without mention of fluctuations: Secondary | ICD-10-CM | POA: Diagnosis not present

## 2022-07-14 DIAGNOSIS — F0283 Dementia in other diseases classified elsewhere, unspecified severity, with mood disturbance: Secondary | ICD-10-CM | POA: Diagnosis not present

## 2022-07-15 DIAGNOSIS — Z5189 Encounter for other specified aftercare: Secondary | ICD-10-CM | POA: Diagnosis not present

## 2022-07-15 DIAGNOSIS — Z8589 Personal history of malignant neoplasm of other organs and systems: Secondary | ICD-10-CM | POA: Diagnosis not present

## 2022-07-17 DIAGNOSIS — N390 Urinary tract infection, site not specified: Secondary | ICD-10-CM | POA: Diagnosis not present

## 2022-07-18 DIAGNOSIS — I130 Hypertensive heart and chronic kidney disease with heart failure and stage 1 through stage 4 chronic kidney disease, or unspecified chronic kidney disease: Secondary | ICD-10-CM | POA: Diagnosis not present

## 2022-07-18 DIAGNOSIS — F0284 Dementia in other diseases classified elsewhere, unspecified severity, with anxiety: Secondary | ICD-10-CM | POA: Diagnosis not present

## 2022-07-18 DIAGNOSIS — F0283 Dementia in other diseases classified elsewhere, unspecified severity, with mood disturbance: Secondary | ICD-10-CM | POA: Diagnosis not present

## 2022-07-18 DIAGNOSIS — I509 Heart failure, unspecified: Secondary | ICD-10-CM | POA: Diagnosis not present

## 2022-07-18 DIAGNOSIS — S2239XD Fracture of one rib, unspecified side, subsequent encounter for fracture with routine healing: Secondary | ICD-10-CM | POA: Diagnosis not present

## 2022-07-18 DIAGNOSIS — G20A1 Parkinson's disease without dyskinesia, without mention of fluctuations: Secondary | ICD-10-CM | POA: Diagnosis not present

## 2022-07-19 DIAGNOSIS — I509 Heart failure, unspecified: Secondary | ICD-10-CM | POA: Diagnosis not present

## 2022-07-19 DIAGNOSIS — G20A1 Parkinson's disease without dyskinesia, without mention of fluctuations: Secondary | ICD-10-CM | POA: Diagnosis not present

## 2022-07-19 DIAGNOSIS — F0283 Dementia in other diseases classified elsewhere, unspecified severity, with mood disturbance: Secondary | ICD-10-CM | POA: Diagnosis not present

## 2022-07-19 DIAGNOSIS — I130 Hypertensive heart and chronic kidney disease with heart failure and stage 1 through stage 4 chronic kidney disease, or unspecified chronic kidney disease: Secondary | ICD-10-CM | POA: Diagnosis not present

## 2022-07-19 DIAGNOSIS — S2239XD Fracture of one rib, unspecified side, subsequent encounter for fracture with routine healing: Secondary | ICD-10-CM | POA: Diagnosis not present

## 2022-07-19 DIAGNOSIS — F0284 Dementia in other diseases classified elsewhere, unspecified severity, with anxiety: Secondary | ICD-10-CM | POA: Diagnosis not present

## 2022-07-22 DIAGNOSIS — G20A1 Parkinson's disease without dyskinesia, without mention of fluctuations: Secondary | ICD-10-CM | POA: Diagnosis not present

## 2022-07-22 DIAGNOSIS — I509 Heart failure, unspecified: Secondary | ICD-10-CM | POA: Diagnosis not present

## 2022-07-22 DIAGNOSIS — F0283 Dementia in other diseases classified elsewhere, unspecified severity, with mood disturbance: Secondary | ICD-10-CM | POA: Diagnosis not present

## 2022-07-22 DIAGNOSIS — I130 Hypertensive heart and chronic kidney disease with heart failure and stage 1 through stage 4 chronic kidney disease, or unspecified chronic kidney disease: Secondary | ICD-10-CM | POA: Diagnosis not present

## 2022-07-22 DIAGNOSIS — S2239XD Fracture of one rib, unspecified side, subsequent encounter for fracture with routine healing: Secondary | ICD-10-CM | POA: Diagnosis not present

## 2022-07-22 DIAGNOSIS — F0284 Dementia in other diseases classified elsewhere, unspecified severity, with anxiety: Secondary | ICD-10-CM | POA: Diagnosis not present

## 2022-07-23 ENCOUNTER — Telehealth: Payer: Self-pay | Admitting: Neurology

## 2022-07-23 DIAGNOSIS — G20A1 Parkinson's disease without dyskinesia, without mention of fluctuations: Secondary | ICD-10-CM | POA: Diagnosis not present

## 2022-07-23 DIAGNOSIS — I130 Hypertensive heart and chronic kidney disease with heart failure and stage 1 through stage 4 chronic kidney disease, or unspecified chronic kidney disease: Secondary | ICD-10-CM | POA: Diagnosis not present

## 2022-07-23 DIAGNOSIS — F0283 Dementia in other diseases classified elsewhere, unspecified severity, with mood disturbance: Secondary | ICD-10-CM | POA: Diagnosis not present

## 2022-07-23 DIAGNOSIS — F0284 Dementia in other diseases classified elsewhere, unspecified severity, with anxiety: Secondary | ICD-10-CM | POA: Diagnosis not present

## 2022-07-23 DIAGNOSIS — S2239XD Fracture of one rib, unspecified side, subsequent encounter for fracture with routine healing: Secondary | ICD-10-CM | POA: Diagnosis not present

## 2022-07-23 DIAGNOSIS — I509 Heart failure, unspecified: Secondary | ICD-10-CM | POA: Diagnosis not present

## 2022-07-23 NOTE — Telephone Encounter (Signed)
Pt's assisted living center called her and told her the pt had a seizure that lasted about 4 minutes. She wanted Dr. Carles Collet to be aware of that when the pt comes in on 08/12/22 for her next follow up.

## 2022-07-24 DIAGNOSIS — E08311 Diabetes mellitus due to underlying condition with unspecified diabetic retinopathy with macular edema: Secondary | ICD-10-CM | POA: Diagnosis not present

## 2022-07-24 NOTE — Telephone Encounter (Signed)
1.  I see no ER visits.  If this was new onset seizure lasting that long, she should have been evaluated in ER and still needs asap f/u with pcp 2.  Who diagnosed "seizure."  How did they know it was primary seizure?    Called patients daughter Loletha Carrow and she filled in some history about this seizure.  Patient was with the Med Tech at the assisted living facility when the seizure began she stayed with patient and timed the incident Patient was visited by a hospice nurse who evaluated her I told patients daughter she needs further evaluation. Patients daughter reaching out to [facility] to come by and see her mom.

## 2022-07-25 DIAGNOSIS — F0284 Dementia in other diseases classified elsewhere, unspecified severity, with anxiety: Secondary | ICD-10-CM | POA: Diagnosis not present

## 2022-07-25 DIAGNOSIS — S2239XD Fracture of one rib, unspecified side, subsequent encounter for fracture with routine healing: Secondary | ICD-10-CM | POA: Diagnosis not present

## 2022-07-25 DIAGNOSIS — I130 Hypertensive heart and chronic kidney disease with heart failure and stage 1 through stage 4 chronic kidney disease, or unspecified chronic kidney disease: Secondary | ICD-10-CM | POA: Diagnosis not present

## 2022-07-25 DIAGNOSIS — I509 Heart failure, unspecified: Secondary | ICD-10-CM | POA: Diagnosis not present

## 2022-07-25 DIAGNOSIS — G20A1 Parkinson's disease without dyskinesia, without mention of fluctuations: Secondary | ICD-10-CM | POA: Diagnosis not present

## 2022-07-25 DIAGNOSIS — F0283 Dementia in other diseases classified elsewhere, unspecified severity, with mood disturbance: Secondary | ICD-10-CM | POA: Diagnosis not present

## 2022-07-25 NOTE — Progress Notes (Deleted)
[place] Phone: [phone] Subjective:    I'm seeing this patient by the request  of:  Pcp, No  CC:   [id number]  06/25/2022 Concerned the patient could have another possible vertebral fracture.  This could be causing some weakness.  I do believe the patient is also failure to thrive.  Patient does have this in the house but I do think patient trying to be active with range of motion would be better than anything else at the moment.  [name] agrees with the plan.  Total time discussing this with patient, caregiver 44 minutes.   Known lumbar spinal stenosis with worsening pain at the moment.  Patient has hyperreflexia and chronic muscular spasm secondary to patient's underlying Parkinson's.  Recent loss of husband and patient is on hospice at this point.  Multiple falls we do need to be careful with different types of medications.  We discussed different treatment options including Toradol and Depo-Medrol injections today.  Discussed the pain relievers that patient is getting with hospice and consider doing MiraLAX and Colace to help her with the chronic constipation that is secondary to this medication.  These would be recommendations.  I do highly think that getting patient to do something he had would be beneficial.  Discussed with him now that at this stage it is difficult for [place] to do anything else more aggressive that would be helping patient's quality of care would recommend continuing under the care of hospice.      Update 07/31/2022 [name] is a 87 y.o. female coming in with complaint of lumbar spine pain. Patient states       Past Medical History:  Diagnosis Date   Allergy    Anemia    Cancer of left breast ([place]) 1978   CHF (congestive heart failure) ([place])    Chronic thoracic back pain    "T8; fracture; 03/2015; no OR" (05/05/2016)   GERD (gastroesophageal reflux disease)    Heart  murmur    History of hiatal hernia    Hyperlipidemia    Hyperplastic colon polyp    Hypertension    IBS (irritable bowel syndrome)    Multiple thyroid nodules    Osteoporosis    T8 compression fx 03/2015    Personal history of radiation therapy    Presence of permanent cardiac pacemaker    Vitamin B12 deficiency    Past Surgical History:  Procedure Laterality Date   ANTERIOR CERVICAL DECOMP/DISCECTOMY FUSION  2008   C5-6   AUGMENTATION MAMMAPLASTY     BACK SURGERY     BREAST SURGERY     CARDIAC CATHETERIZATION  2001   EP IMPLANTABLE DEVICE N/A 05/05/2016   Procedure: Pacemaker Implant;  Surgeon: Evans Lance, MD;  Location: [facility];  Service: Cardiovascular;  Laterality: N/A;   EXCISIONAL HEMORRHOIDECTOMY  1984   With subsequent correction of surgery   INSERT / REPLACE / REMOVE PACEMAKER     INTRAVASCULAR PRESSURE WIRE/FFR STUDY N/A 10/07/2019   Procedure: INTRAVASCULAR PRESSURE WIRE/FFR STUDY;  Surgeon: Jettie Booze, MD;  Location: [facility];  Service: Cardiovascular;  Laterality: N/A;   KNEE ARTHROSCOPY Left    "meniscus tear"   LEFT HEART CATH AND CORONARY ANGIOGRAPHY N/A 10/07/2019   Procedure: LEFT HEART CATH AND CORONARY ANGIOGRAPHY;  Surgeon: Jettie Booze, MD;  Location: [facility];  Service: Cardiovascular;  Laterality: N/A;   MASTECTOMY Left  1978   PLACEMENT OF BREAST IMPLANTS Left 1981   REDUCTION MAMMAPLASTY Right    SHOULDER ARTHROSCOPY W/ ROTATOR CUFF REPAIR Left 02/2006   "tear"   TUBAL LIGATION     Social History   Socioeconomic History   Marital status: Married    Spouse name: Not on file   Number of children: 2   Years of education: Not on file   Highest education level: Not on file  Occupational History   Occupation: retired  Tobacco Use   Smoking status: Never   Smokeless tobacco: Never  Vaping Use   Vaping Use: Never used  Substance and Sexual Activity   Alcohol use: No    Alcohol/week: 0.0 standard  drinks of alcohol   Drug use: No   Sexual activity: Not Currently  Other Topics Concern   Not on file  Social History Narrative   Housewife, Lives with spouse, 2 children   Social Determinants of Health   Financial Resource Strain: [place]  (04/04/2021)   Overall Financial Resource Strain (CARDIA)    Difficulty of Paying Living Expenses: Not hard at all  Food Insecurity: No Food Insecurity (05/17/2022)   Hunger Vital Sign    Worried About Running Out of Food in the Last Year: Never true    [place] in the Last Year: Never true  Transportation Needs: No Transportation Needs (05/17/2022)   PRAPARE - Hydrologist (Medical): No    Lack of Transportation (Non-Medical): No  Physical Activity: Inactive (04/04/2021)   Exercise Vital Sign    Days of Exercise per Week: 0 days    Minutes of Exercise per Session: 0 min  Stress: No Stress Concern Present (04/04/2021)   [place]    Feeling of Stress : Not at all  Social Connections: Socially Integrated (04/04/2021)   Social Connection and Isolation Panel [NHANES]    Frequency of Communication with Friends and Family: More than three times a week    Frequency of Social Gatherings with Friends and Family: More than three times a week    Attends Religious Services: 1 to 4 times per year    Active Member of [employer] or Organizations: No    Attends Music therapist: 1 to 4 times per year    Marital Status: Married   Allergies  Allergen Reactions   Statins Other (See Comments)    Lipitor caused hospitalization - - depletion of electrolytes, fever, nausea, loss of appetite, couldn't get out of bed   Cymbalta [Duloxetine Hcl] Other (See Comments)    Dulled her too much, difficulty urinating, change in vision- "Allergic," per [facility]   Fosamax [Alendronate Sodium] Other (See Comments)    Caused chronic issues swallowing   Amitriptyline Other  (See Comments)    Hallucinations    Fosamax [Alendronate] Other (See Comments)    "Allergic," per [facility]   Lyrica [Pregabalin] Other (See Comments)    "Allergic," per [name]   Ranitidine Other (See Comments)    "Allergic," per [name]   Neurontin [Gabapentin] Other (See Comments)    Dizziness and sedation (patient is tolerating in lower dose)- "Allergic," per [name]   Zanaflex [Tizanidine] Other (See Comments)    Could not sleep   Family History  Problem Relation Age of Onset   Stroke Sister    Diabetes Brother    Breast cancer Maternal Aunt    Atrial fibrillation Sister    Colon cancer  Neg Hx    Stomach cancer Neg Hx     Current Outpatient Medications (Endocrine & Metabolic):    denosumab (PROLIA) 60 MG/ML SOSY injection, Inject 60 mg into the skin every 6 (six) months.  Current Outpatient Medications (Cardiovascular):    ezetimibe (ZETIA) 10 MG tablet, TAKE ONE TABLET BY MOUTH EVERY MORNING (Patient taking differently: Take 10 mg by mouth in the morning.)   torsemide (DEMADEX) 5 MG tablet, Take 5 mg by mouth in the morning.   Current Outpatient Medications (Analgesics):    acetaminophen (TYLENOL) 325 MG tablet, Take 2 tablets (650 mg total) by mouth every 6 (six) hours for 7 days, THEN 2 tablets (650 mg total) every 6 (six) hours as needed.  Current Outpatient Medications (Hematological):    cyanocobalamin (VITAMIN B12) 1000 MCG tablet, Take 1,000 mcg by mouth in the morning.   ferrous sulfate 325 (65 FE) MG tablet, Take 325 mg by mouth every Monday, Wednesday, and Friday.  Current Outpatient Medications (Other):    ALPRAZolam (XANAX) 0.25 MG tablet, Take 0.25 mg by mouth 2 (two) times daily as needed for anxiety.   busPIRone (BUSPAR) 10 MG tablet, Take 1 tablet (10 mg total) by mouth 3 (three) times daily as needed. (Patient taking differently: Take 10 mg by mouth 3 (three) times daily as needed (for anxiety).)   Cholecalciferol (VITAMIN D3) 50 MCG (2000 UT) TABS, Take 2,000 Units by  mouth in the morning.   diclofenac Sodium (VOLTAREN) 1 % GEL, Apply 2 g topically 4 (four) times daily.   escitalopram (LEXAPRO) 10 MG tablet, Take one tablet daily (Patient taking differently: Take 10 mg by mouth in the morning.)   Misc. Devices (WHEELCHAIR) MISC, Power Wheelchair   omeprazole (PRILOSEC) 40 MG capsule, TAKE 1 CAPSULE BY MOUTH TWICE A DAY FOR GERD. (Patient taking differently: Take 40 mg by mouth 2 (two) times daily before a meal.)   Polyethyl Glycol-Propyl Glycol (LUBRICANT EYE DROPS) 0.4-0.3 % SOLN, Place 1 drop into both eyes 3 (three) times daily as needed (dry/irritated eyes.).   polyethylene glycol (MIRALAX / GLYCOLAX) packet, Take 17 g by mouth daily as needed (constipation- MIX AND DRINK AS DIRECTED).   senna-docusate (SENOKOT-S) 8.6-50 MG tablet, Take 1 tablet by mouth 2 (two) times daily between meals as needed for mild constipation.   Reviewed prior external information including notes and imaging from  primary care provider As well as notes that were available from care everywhere and other healthcare systems.  Past medical history, social, surgical and family history all reviewed in electronic medical record.  No pertanent information unless stated regarding to the chief complaint.   Review of Systems:  No headache, visual changes, nausea, vomiting, diarrhea, constipation, dizziness, abdominal pain, skin rash, fevers, chills, night sweats, weight loss, swollen lymph nodes, body aches, joint swelling, chest pain, shortness of breath, mood changes. POSITIVE muscle aches  Objective  There were no vitals taken for this visit.   General: No apparent distress alert and oriented x3 mood and affect normal, dressed appropriately.  HEENT: Pupils equal, extraocular movements intact  Respiratory: Patient's speak in full sentences and does not appear short of breath  Cardiovascular: No lower extremity edema, non tender, no erythema      Impression and Recommendations:

## 2022-07-28 DIAGNOSIS — F0284 Dementia in other diseases classified elsewhere, unspecified severity, with anxiety: Secondary | ICD-10-CM | POA: Diagnosis not present

## 2022-07-28 DIAGNOSIS — N1832 Chronic kidney disease, stage 3b: Secondary | ICD-10-CM | POA: Diagnosis not present

## 2022-07-28 DIAGNOSIS — S2239XD Fracture of one rib, unspecified side, subsequent encounter for fracture with routine healing: Secondary | ICD-10-CM | POA: Diagnosis not present

## 2022-07-28 DIAGNOSIS — I130 Hypertensive heart and chronic kidney disease with heart failure and stage 1 through stage 4 chronic kidney disease, or unspecified chronic kidney disease: Secondary | ICD-10-CM | POA: Diagnosis not present

## 2022-07-28 DIAGNOSIS — E785 Hyperlipidemia, unspecified: Secondary | ICD-10-CM | POA: Diagnosis not present

## 2022-07-28 DIAGNOSIS — I509 Heart failure, unspecified: Secondary | ICD-10-CM | POA: Diagnosis not present

## 2022-07-28 DIAGNOSIS — D649 Anemia, unspecified: Secondary | ICD-10-CM | POA: Diagnosis not present

## 2022-07-28 DIAGNOSIS — F0283 Dementia in other diseases classified elsewhere, unspecified severity, with mood disturbance: Secondary | ICD-10-CM | POA: Diagnosis not present

## 2022-07-28 DIAGNOSIS — M810 Age-related osteoporosis without current pathological fracture: Secondary | ICD-10-CM | POA: Diagnosis not present

## 2022-07-28 DIAGNOSIS — G20A1 Parkinson's disease without dyskinesia, without mention of fluctuations: Secondary | ICD-10-CM | POA: Diagnosis not present

## 2022-07-29 DIAGNOSIS — G20A1 Parkinson's disease without dyskinesia, without mention of fluctuations: Secondary | ICD-10-CM | POA: Diagnosis not present

## 2022-07-29 DIAGNOSIS — F0284 Dementia in other diseases classified elsewhere, unspecified severity, with anxiety: Secondary | ICD-10-CM | POA: Diagnosis not present

## 2022-07-29 DIAGNOSIS — S2239XD Fracture of one rib, unspecified side, subsequent encounter for fracture with routine healing: Secondary | ICD-10-CM | POA: Diagnosis not present

## 2022-07-29 DIAGNOSIS — I509 Heart failure, unspecified: Secondary | ICD-10-CM | POA: Diagnosis not present

## 2022-07-29 DIAGNOSIS — I130 Hypertensive heart and chronic kidney disease with heart failure and stage 1 through stage 4 chronic kidney disease, or unspecified chronic kidney disease: Secondary | ICD-10-CM | POA: Diagnosis not present

## 2022-07-29 DIAGNOSIS — F0283 Dementia in other diseases classified elsewhere, unspecified severity, with mood disturbance: Secondary | ICD-10-CM | POA: Diagnosis not present

## 2022-07-30 ENCOUNTER — Ambulatory Visit (INDEPENDENT_AMBULATORY_CARE_PROVIDER_SITE_OTHER): Payer: Medicare Other

## 2022-07-30 DIAGNOSIS — G20A1 Parkinson's disease without dyskinesia, without mention of fluctuations: Secondary | ICD-10-CM | POA: Diagnosis not present

## 2022-07-30 DIAGNOSIS — F0284 Dementia in other diseases classified elsewhere, unspecified severity, with anxiety: Secondary | ICD-10-CM | POA: Diagnosis not present

## 2022-07-30 DIAGNOSIS — F0283 Dementia in other diseases classified elsewhere, unspecified severity, with mood disturbance: Secondary | ICD-10-CM | POA: Diagnosis not present

## 2022-07-30 DIAGNOSIS — I495 Sick sinus syndrome: Secondary | ICD-10-CM | POA: Diagnosis not present

## 2022-07-30 DIAGNOSIS — S2239XD Fracture of one rib, unspecified side, subsequent encounter for fracture with routine healing: Secondary | ICD-10-CM | POA: Diagnosis not present

## 2022-07-30 DIAGNOSIS — I130 Hypertensive heart and chronic kidney disease with heart failure and stage 1 through stage 4 chronic kidney disease, or unspecified chronic kidney disease: Secondary | ICD-10-CM | POA: Diagnosis not present

## 2022-07-30 DIAGNOSIS — I509 Heart failure, unspecified: Secondary | ICD-10-CM | POA: Diagnosis not present

## 2022-07-31 ENCOUNTER — Ambulatory Visit: Payer: Medicare Other | Admitting: Family Medicine

## 2022-07-31 DIAGNOSIS — I509 Heart failure, unspecified: Secondary | ICD-10-CM | POA: Diagnosis not present

## 2022-07-31 DIAGNOSIS — F0284 Dementia in other diseases classified elsewhere, unspecified severity, with anxiety: Secondary | ICD-10-CM | POA: Diagnosis not present

## 2022-07-31 DIAGNOSIS — I130 Hypertensive heart and chronic kidney disease with heart failure and stage 1 through stage 4 chronic kidney disease, or unspecified chronic kidney disease: Secondary | ICD-10-CM | POA: Diagnosis not present

## 2022-07-31 DIAGNOSIS — S2239XD Fracture of one rib, unspecified side, subsequent encounter for fracture with routine healing: Secondary | ICD-10-CM | POA: Diagnosis not present

## 2022-07-31 DIAGNOSIS — G20A1 Parkinson's disease without dyskinesia, without mention of fluctuations: Secondary | ICD-10-CM | POA: Diagnosis not present

## 2022-07-31 DIAGNOSIS — F0283 Dementia in other diseases classified elsewhere, unspecified severity, with mood disturbance: Secondary | ICD-10-CM | POA: Diagnosis not present

## 2022-07-31 LAB — CUP PACEART REMOTE DEVICE CHECK
Battery Remaining Longevity: 108 mo
Battery Remaining Percentage: 100 %
Brady Statistic RA Percent Paced: 27 %
Brady Statistic RV Percent Paced: 0 %
Date Time Interrogation Session: 20240102030100
Implantable Lead Connection Status: 753985
Implantable Lead Connection Status: 753985
Implantable Lead Implant Date: 20171009
Implantable Lead Implant Date: 20171009
Implantable Lead Location: 753859
Implantable Lead Location: 753860
Implantable Lead Model: 5076
Implantable Lead Model: 7741
Implantable Lead Serial Number: 785430
Implantable Pulse Generator Implant Date: 20171009
Lead Channel Impedance Value: 1949 Ohm
Lead Channel Impedance Value: 421 Ohm
Lead Channel Setting Pacing Amplitude: 2 V
Lead Channel Setting Pacing Amplitude: 2.6 V
Lead Channel Setting Pacing Pulse Width: 1 ms
Lead Channel Setting Sensing Sensitivity: 2.5 mV
Pulse Gen Serial Number: 766467
Zone Setting Status: 755011

## 2022-08-04 DIAGNOSIS — I130 Hypertensive heart and chronic kidney disease with heart failure and stage 1 through stage 4 chronic kidney disease, or unspecified chronic kidney disease: Secondary | ICD-10-CM | POA: Diagnosis not present

## 2022-08-04 DIAGNOSIS — G20A1 Parkinson's disease without dyskinesia, without mention of fluctuations: Secondary | ICD-10-CM | POA: Diagnosis not present

## 2022-08-04 DIAGNOSIS — S2239XD Fracture of one rib, unspecified side, subsequent encounter for fracture with routine healing: Secondary | ICD-10-CM | POA: Diagnosis not present

## 2022-08-04 DIAGNOSIS — F0283 Dementia in other diseases classified elsewhere, unspecified severity, with mood disturbance: Secondary | ICD-10-CM | POA: Diagnosis not present

## 2022-08-04 DIAGNOSIS — F0284 Dementia in other diseases classified elsewhere, unspecified severity, with anxiety: Secondary | ICD-10-CM | POA: Diagnosis not present

## 2022-08-04 DIAGNOSIS — I509 Heart failure, unspecified: Secondary | ICD-10-CM | POA: Diagnosis not present

## 2022-08-08 DIAGNOSIS — F0284 Dementia in other diseases classified elsewhere, unspecified severity, with anxiety: Secondary | ICD-10-CM | POA: Diagnosis not present

## 2022-08-08 DIAGNOSIS — S2239XD Fracture of one rib, unspecified side, subsequent encounter for fracture with routine healing: Secondary | ICD-10-CM | POA: Diagnosis not present

## 2022-08-08 DIAGNOSIS — I509 Heart failure, unspecified: Secondary | ICD-10-CM | POA: Diagnosis not present

## 2022-08-08 DIAGNOSIS — I130 Hypertensive heart and chronic kidney disease with heart failure and stage 1 through stage 4 chronic kidney disease, or unspecified chronic kidney disease: Secondary | ICD-10-CM | POA: Diagnosis not present

## 2022-08-08 DIAGNOSIS — G20A1 Parkinson's disease without dyskinesia, without mention of fluctuations: Secondary | ICD-10-CM | POA: Diagnosis not present

## 2022-08-08 DIAGNOSIS — F0283 Dementia in other diseases classified elsewhere, unspecified severity, with mood disturbance: Secondary | ICD-10-CM | POA: Diagnosis not present

## 2022-08-11 NOTE — Progress Notes (Unsigned)
Assessment/Plan:   1.  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  -cannot do MRI brain due to PPM that is not MRI compatible.  -DaT scan positive, with decreased uptake in bilateral putamen  -following with Dr. Renda Rolls  -Now utilizing power wheelchair.  -Future planning discussed, especially with husband's ailing health.  This was the majority of the visit.  -Patient has both physiologic tremor as well as functional/psychogenic tremor noted again today.  We discussed the stress component.  -asked for dnr.  And we discussed this in detail.  Last visit, she did not want to be DNR, so I discussed in detail exactly what DNR was and she expressed understanding and her desire to be a DNR.  Forms were filled out.  -Discussed skin biopsies.  Discussed what they would offer and what it would not.  She has declined.   2.  Orthostatic hypotension  -Following with cardiology.  No longer on Lasix.  Metoprolol was previously cut in half.  I wonder if she still needs it as she is feeling very tired and her blood pressure is still quite low.  Told her she needed to follow-up with cardiology.    3.  GAD/depression  -on buspar  -Increase Lexapro, 20 mg daily   4.  Confusion  -Did not appear confused today.  -Patient canceled neurocognitive testing that was scheduled in the past.  5.  Dysphagia  - MBE was completed on March 06, 2021.  Swallowing function was worse than 1 year prior.  There was evidence of oropharyngeal and pharyngeal esophageal phase dysphagia mechanical soft solids, thin liquids, meds crushed with pure recommended.  Patient thinks that this is getting worse.  Was going to consider repeating this, but since she decided to make herself DNR, I decided to hold on that until they have further family discussions.   Subjective:   [name] was seen today in follow up for newly diagnosed parkinsonism.  Patient with***who  supplements history.  Fortunately, patient's husband passed away since our last visit.  They did call me at the end of December to state that the patient had a seizure that lasted for minutes.  We called them back to get details and they stated that a med tech was present when the event happened and then a hospice nurse was called.  They were going to reach out to the hospice physician to ask for a more detailed evaluation, which I thought was good as the patient has had many episodes of shaking spells previously, some of which were fairly violent that were not seizure.  She was in the emergency room at the end of July with generalized weakness, at which point they decreased her Lexapro back to 10 mg daily.  She was back in the hospital, this time for admission in October after a fall.  This resulted in an endplate fracture of the T2 and T4 vertebral bodies and mild inferior endplate compression fracture of C7 and not displaced fractures through the right transverse process of T7, T8, T9 and T10, and nondisplaced fractures of posterior right sixth, eighth and ninth ribs.  She is Dr. Charlann Boxer move after this for the pain.  Current movement disorder medications: Lexapro, 10 mg daily (decreased by the emergency room in July)  Prior medications: Levodopa (discontinued because not helpful)  ALLERGIES:   Allergies  Allergen Reactions   Statins Other (See Comments)    Lipitor caused hospitalization - - depletion of  electrolytes, fever, nausea, loss of appetite, couldn't get out of bed   Cymbalta [Duloxetine Hcl] Other (See Comments)    Dulled her too much, difficulty urinating, change in vision- "Allergic," per [facility]   Fosamax [Alendronate Sodium] Other (See Comments)    Caused chronic issues swallowing   Amitriptyline Other (See Comments)    Hallucinations    Fosamax [Alendronate] Other (See Comments)    "Allergic," per [facility]   Lyrica [Pregabalin] Other (See Comments)    "Allergic," per [name]    Ranitidine Other (See Comments)    "Allergic," per [name]   Neurontin [Gabapentin] Other (See Comments)    Dizziness and sedation (patient is tolerating in lower dose)- "Allergic," per [name]   Zanaflex [Tizanidine] Other (See Comments)    Could not sleep    CURRENT MEDICATIONS:  Outpatient Encounter Medications as of 08/12/2022  Medication Sig   acetaminophen (TYLENOL) 325 MG tablet Take 2 tablets (650 mg total) by mouth every 6 (six) hours for 7 days, THEN 2 tablets (650 mg total) every 6 (six) hours as needed.   ALPRAZolam (XANAX) 0.25 MG tablet Take 0.25 mg by mouth 2 (two) times daily as needed for anxiety.   busPIRone (BUSPAR) 10 MG tablet Take 1 tablet (10 mg total) by mouth 3 (three) times daily as needed. (Patient taking differently: Take 10 mg by mouth 3 (three) times daily as needed (for anxiety).)   Cholecalciferol (VITAMIN D3) 50 MCG (2000 UT) TABS Take 2,000 Units by mouth in the morning.   cyanocobalamin (VITAMIN B12) 1000 MCG tablet Take 1,000 mcg by mouth in the morning.   denosumab (PROLIA) 60 MG/ML SOSY injection Inject 60 mg into the skin every 6 (six) months.   diclofenac Sodium (VOLTAREN) 1 % GEL Apply 2 g topically 4 (four) times daily.   escitalopram (LEXAPRO) 10 MG tablet Take one tablet daily (Patient taking differently: Take 10 mg by mouth in the morning.)   ezetimibe (ZETIA) 10 MG tablet TAKE ONE TABLET BY MOUTH EVERY MORNING (Patient taking differently: Take 10 mg by mouth in the morning.)   ferrous sulfate 325 (65 FE) MG tablet Take 325 mg by mouth every Monday, Wednesday, and Friday.   Misc. Devices [facility]) MISC Power Wheelchair   omeprazole (PRILOSEC) 40 MG capsule TAKE 1 CAPSULE BY MOUTH TWICE A DAY FOR GERD. (Patient taking differently: Take 40 mg by mouth 2 (two) times daily before a meal.)   Polyethyl Glycol-Propyl Glycol (LUBRICANT EYE DROPS) 0.4-0.3 % SOLN Place 1 drop into both eyes 3 (three) times daily as needed (dry/irritated eyes.).   polyethylene  glycol (MIRALAX / GLYCOLAX) packet Take 17 g by mouth daily as needed (constipation- MIX AND DRINK AS DIRECTED).   senna-docusate (SENOKOT-S) 8.6-50 MG tablet Take 1 tablet by mouth 2 (two) times daily between meals as needed for mild constipation.   torsemide (DEMADEX) 5 MG tablet Take 5 mg by mouth in the morning.   No facility-administered encounter medications on file as of 08/12/2022.    Objective:   PHYSICAL EXAMINATION:    VITALS:   There were no vitals filed for this visit.      No data found.   GEN:  The patient appears stated age and is in NAD. HEENT:  Normocephalic, atraumatic.  The mucous membranes are moist.   Neurological examination:  Orientation: The patient is alert and oriented x3. Cranial nerves: There is good facial symmetry with facial hypomimia.  Extraocular muscles are intact.  She does have decreased blink.  The  speech is fluent and clear.  Abnormal movements: There is LEFT UPPER EXTREMITY rest tremor.  There is also functional tremor that is more exaggerated and there is entrainment associated with that.   Gait:  not tested today due to previous experience (see prior note).  In [place].   I have reviewed and interpreted the following labs independently    Chemistry      Component Value Date/Time   NA 140 05/19/2022 0351   NA 139 03/12/2022 1052   K 3.6 05/19/2022 0351   CL 104 05/19/2022 0351   CO2 25 05/19/2022 0351   BUN 17 05/19/2022 0351   BUN 16 03/12/2022 1052   CREATININE 1.19 (H) 05/19/2022 0351   CREATININE 1.23 (H) 03/30/2020 1011      Component Value Date/Time   CALCIUM 9.3 05/19/2022 0351   ALKPHOS 34 (L) 01/15/2021 1459   AST 12 01/15/2021 1459   ALT 4 01/15/2021 1459   BILITOT 0.3 01/15/2021 1459       Lab Results  Component Value Date   WBC 8.7 05/19/2022   HGB 10.7 (L) 05/19/2022   HCT 32.9 (L) 05/19/2022   MCV 101.9 (H) 05/19/2022   PLT 264 05/19/2022    Lab Results  Component Value Date   TSH 1.85 02/03/2020      Total time spent on today's visit was *** minutes, including both face-to-face time and nonface-to-face time.  Time included that spent on review of records (prior notes available to me/labs/imaging if pertinent), discussing treatment and goals, answering patient's questions and coordinating care.  Cc:  Pcp, No

## 2022-08-12 ENCOUNTER — Ambulatory Visit (INDEPENDENT_AMBULATORY_CARE_PROVIDER_SITE_OTHER): Payer: Medicare Other | Admitting: Neurology

## 2022-08-12 ENCOUNTER — Encounter: Payer: Self-pay | Admitting: Neurology

## 2022-08-12 VITALS — BP 110/62 | HR 71 | Ht 61.0 in | Wt 147.0 lb

## 2022-08-12 DIAGNOSIS — G20C Parkinsonism, unspecified: Secondary | ICD-10-CM

## 2022-08-12 DIAGNOSIS — R251 Tremor, unspecified: Secondary | ICD-10-CM | POA: Diagnosis not present

## 2022-08-12 DIAGNOSIS — R410 Disorientation, unspecified: Secondary | ICD-10-CM

## 2022-08-13 ENCOUNTER — Encounter: Payer: Self-pay | Admitting: Neurology

## 2022-08-14 ENCOUNTER — Other Ambulatory Visit: Payer: Self-pay

## 2022-08-14 DIAGNOSIS — F0283 Dementia in other diseases classified elsewhere, unspecified severity, with mood disturbance: Secondary | ICD-10-CM | POA: Diagnosis not present

## 2022-08-14 DIAGNOSIS — I509 Heart failure, unspecified: Secondary | ICD-10-CM | POA: Diagnosis not present

## 2022-08-14 DIAGNOSIS — S2239XD Fracture of one rib, unspecified side, subsequent encounter for fracture with routine healing: Secondary | ICD-10-CM | POA: Diagnosis not present

## 2022-08-14 DIAGNOSIS — I130 Hypertensive heart and chronic kidney disease with heart failure and stage 1 through stage 4 chronic kidney disease, or unspecified chronic kidney disease: Secondary | ICD-10-CM | POA: Diagnosis not present

## 2022-08-14 DIAGNOSIS — F0284 Dementia in other diseases classified elsewhere, unspecified severity, with anxiety: Secondary | ICD-10-CM | POA: Diagnosis not present

## 2022-08-14 DIAGNOSIS — G20A1 Parkinson's disease without dyskinesia, without mention of fluctuations: Secondary | ICD-10-CM | POA: Diagnosis not present

## 2022-08-14 MED ORDER — ACETAMINOPHEN 325 MG PO TABS: ORAL_TABLET | ORAL | 0 refills | Status: AC

## 2022-08-19 DIAGNOSIS — G20A1 Parkinson's disease without dyskinesia, without mention of fluctuations: Secondary | ICD-10-CM | POA: Diagnosis not present

## 2022-08-19 DIAGNOSIS — S2239XD Fracture of one rib, unspecified side, subsequent encounter for fracture with routine healing: Secondary | ICD-10-CM | POA: Diagnosis not present

## 2022-08-19 DIAGNOSIS — I509 Heart failure, unspecified: Secondary | ICD-10-CM | POA: Diagnosis not present

## 2022-08-19 DIAGNOSIS — F0283 Dementia in other diseases classified elsewhere, unspecified severity, with mood disturbance: Secondary | ICD-10-CM | POA: Diagnosis not present

## 2022-08-19 DIAGNOSIS — I130 Hypertensive heart and chronic kidney disease with heart failure and stage 1 through stage 4 chronic kidney disease, or unspecified chronic kidney disease: Secondary | ICD-10-CM | POA: Diagnosis not present

## 2022-08-19 DIAGNOSIS — F0284 Dementia in other diseases classified elsewhere, unspecified severity, with anxiety: Secondary | ICD-10-CM | POA: Diagnosis not present

## 2022-08-20 ENCOUNTER — Ambulatory Visit (INDEPENDENT_AMBULATORY_CARE_PROVIDER_SITE_OTHER): Payer: Medicare Other | Admitting: Neurology

## 2022-08-20 DIAGNOSIS — R410 Disorientation, unspecified: Secondary | ICD-10-CM

## 2022-08-20 DIAGNOSIS — R251 Tremor, unspecified: Secondary | ICD-10-CM | POA: Diagnosis not present

## 2022-08-20 NOTE — Progress Notes (Unsigned)
EEG complete - results pending 

## 2022-08-21 DIAGNOSIS — F0284 Dementia in other diseases classified elsewhere, unspecified severity, with anxiety: Secondary | ICD-10-CM | POA: Diagnosis not present

## 2022-08-21 DIAGNOSIS — G20A1 Parkinson's disease without dyskinesia, without mention of fluctuations: Secondary | ICD-10-CM | POA: Diagnosis not present

## 2022-08-21 DIAGNOSIS — S2239XD Fracture of one rib, unspecified side, subsequent encounter for fracture with routine healing: Secondary | ICD-10-CM | POA: Diagnosis not present

## 2022-08-21 DIAGNOSIS — I509 Heart failure, unspecified: Secondary | ICD-10-CM | POA: Diagnosis not present

## 2022-08-21 DIAGNOSIS — I130 Hypertensive heart and chronic kidney disease with heart failure and stage 1 through stage 4 chronic kidney disease, or unspecified chronic kidney disease: Secondary | ICD-10-CM | POA: Diagnosis not present

## 2022-08-21 DIAGNOSIS — F0283 Dementia in other diseases classified elsewhere, unspecified severity, with mood disturbance: Secondary | ICD-10-CM | POA: Diagnosis not present

## 2022-08-21 NOTE — Procedures (Signed)
TECHNICAL SUMMARY:  A multichannel referential and bipolar montage EEG using the standard international 10-20 system was performed on the patient described as awake.  The dominant background activity consists of 8-8.5 hertz activity seen most prominantly over the posterior head region.  The backgound activity is minimally reactive to eye opening and closing procedures.  Low voltage fast (beta) activity is distributed symmetrically and maximally over the anterior head regions.  ACTIVATION:  Stepwise photic stimulation at 4-20 flashes per second was performed and did not elicit any abnormal waveforms.  Hyperventilation was not performed.  EPILEPTIFORM ACTIVITY:  There were no spikes, sharp waves or paroxysmal activity.  SLEEP:  brief drowsiness is noted but no stage 2 sleep.  Pt had multiple episodes of bilateral upper extremity shaking, left upper extremity shaking, right upper extremity shaking, without change in the background activity.   IMPRESSION:  This is a normal EEG for the patients stated age.  There were no focal, hemispheric or lateralizing features.  No epileptiform activity was recorded.  A normal EEG does not exclude the diagnosis of a seizure disorder and if seizure remains high on the list of differential diagnosis, an ambulatory EEG may be of value.  Pt had multiple episodes of bilateral upper extremity shaking, left upper extremity shaking, right upper extremity shaking, without change in the background activity.  Clinical correlation is required.

## 2022-08-25 NOTE — Progress Notes (Signed)
Remote pacemaker transmission.   

## 2022-08-26 DIAGNOSIS — F0284 Dementia in other diseases classified elsewhere, unspecified severity, with anxiety: Secondary | ICD-10-CM | POA: Diagnosis not present

## 2022-08-26 DIAGNOSIS — I130 Hypertensive heart and chronic kidney disease with heart failure and stage 1 through stage 4 chronic kidney disease, or unspecified chronic kidney disease: Secondary | ICD-10-CM | POA: Diagnosis not present

## 2022-08-26 DIAGNOSIS — G20A1 Parkinson's disease without dyskinesia, without mention of fluctuations: Secondary | ICD-10-CM | POA: Diagnosis not present

## 2022-08-26 DIAGNOSIS — I509 Heart failure, unspecified: Secondary | ICD-10-CM | POA: Diagnosis not present

## 2022-08-26 DIAGNOSIS — S2239XD Fracture of one rib, unspecified side, subsequent encounter for fracture with routine healing: Secondary | ICD-10-CM | POA: Diagnosis not present

## 2022-08-26 DIAGNOSIS — F0283 Dementia in other diseases classified elsewhere, unspecified severity, with mood disturbance: Secondary | ICD-10-CM | POA: Diagnosis not present

## 2022-08-28 DIAGNOSIS — F0283 Dementia in other diseases classified elsewhere, unspecified severity, with mood disturbance: Secondary | ICD-10-CM | POA: Diagnosis not present

## 2022-08-28 DIAGNOSIS — R32 Unspecified urinary incontinence: Secondary | ICD-10-CM | POA: Diagnosis not present

## 2022-08-28 DIAGNOSIS — I509 Heart failure, unspecified: Secondary | ICD-10-CM | POA: Diagnosis not present

## 2022-08-28 DIAGNOSIS — G20A1 Parkinson's disease without dyskinesia, without mention of fluctuations: Secondary | ICD-10-CM | POA: Diagnosis not present

## 2022-08-28 DIAGNOSIS — R159 Full incontinence of feces: Secondary | ICD-10-CM | POA: Diagnosis not present

## 2022-08-28 DIAGNOSIS — Z741 Need for assistance with personal care: Secondary | ICD-10-CM | POA: Diagnosis not present

## 2022-08-28 DIAGNOSIS — N1832 Chronic kidney disease, stage 3b: Secondary | ICD-10-CM | POA: Diagnosis not present

## 2022-08-28 DIAGNOSIS — M810 Age-related osteoporosis without current pathological fracture: Secondary | ICD-10-CM | POA: Diagnosis not present

## 2022-08-28 DIAGNOSIS — I130 Hypertensive heart and chronic kidney disease with heart failure and stage 1 through stage 4 chronic kidney disease, or unspecified chronic kidney disease: Secondary | ICD-10-CM | POA: Diagnosis not present

## 2022-08-28 DIAGNOSIS — D509 Iron deficiency anemia, unspecified: Secondary | ICD-10-CM | POA: Diagnosis not present

## 2022-08-28 DIAGNOSIS — E785 Hyperlipidemia, unspecified: Secondary | ICD-10-CM | POA: Diagnosis not present

## 2022-08-28 DIAGNOSIS — Z87310 Personal history of (healed) osteoporosis fracture: Secondary | ICD-10-CM | POA: Diagnosis not present

## 2022-08-28 DIAGNOSIS — Z6823 Body mass index (BMI) 23.0-23.9, adult: Secondary | ICD-10-CM | POA: Diagnosis not present

## 2022-08-28 DIAGNOSIS — F0284 Dementia in other diseases classified elsewhere, unspecified severity, with anxiety: Secondary | ICD-10-CM | POA: Diagnosis not present

## 2022-09-01 DIAGNOSIS — F0283 Dementia in other diseases classified elsewhere, unspecified severity, with mood disturbance: Secondary | ICD-10-CM | POA: Diagnosis not present

## 2022-09-01 DIAGNOSIS — G20A1 Parkinson's disease without dyskinesia, without mention of fluctuations: Secondary | ICD-10-CM | POA: Diagnosis not present

## 2022-09-01 DIAGNOSIS — N1832 Chronic kidney disease, stage 3b: Secondary | ICD-10-CM | POA: Diagnosis not present

## 2022-09-01 DIAGNOSIS — I509 Heart failure, unspecified: Secondary | ICD-10-CM | POA: Diagnosis not present

## 2022-09-01 DIAGNOSIS — I130 Hypertensive heart and chronic kidney disease with heart failure and stage 1 through stage 4 chronic kidney disease, or unspecified chronic kidney disease: Secondary | ICD-10-CM | POA: Diagnosis not present

## 2022-09-01 DIAGNOSIS — F0284 Dementia in other diseases classified elsewhere, unspecified severity, with anxiety: Secondary | ICD-10-CM | POA: Diagnosis not present

## 2022-09-04 DIAGNOSIS — G20A1 Parkinson's disease without dyskinesia, without mention of fluctuations: Secondary | ICD-10-CM | POA: Diagnosis not present

## 2022-09-04 DIAGNOSIS — N1832 Chronic kidney disease, stage 3b: Secondary | ICD-10-CM | POA: Diagnosis not present

## 2022-09-04 DIAGNOSIS — F0283 Dementia in other diseases classified elsewhere, unspecified severity, with mood disturbance: Secondary | ICD-10-CM | POA: Diagnosis not present

## 2022-09-04 DIAGNOSIS — F0284 Dementia in other diseases classified elsewhere, unspecified severity, with anxiety: Secondary | ICD-10-CM | POA: Diagnosis not present

## 2022-09-04 DIAGNOSIS — I130 Hypertensive heart and chronic kidney disease with heart failure and stage 1 through stage 4 chronic kidney disease, or unspecified chronic kidney disease: Secondary | ICD-10-CM | POA: Diagnosis not present

## 2022-09-04 DIAGNOSIS — I509 Heart failure, unspecified: Secondary | ICD-10-CM | POA: Diagnosis not present

## 2022-09-11 DIAGNOSIS — G20A1 Parkinson's disease without dyskinesia, without mention of fluctuations: Secondary | ICD-10-CM | POA: Diagnosis not present

## 2022-09-11 DIAGNOSIS — N1832 Chronic kidney disease, stage 3b: Secondary | ICD-10-CM | POA: Diagnosis not present

## 2022-09-11 DIAGNOSIS — F0283 Dementia in other diseases classified elsewhere, unspecified severity, with mood disturbance: Secondary | ICD-10-CM | POA: Diagnosis not present

## 2022-09-11 DIAGNOSIS — F0284 Dementia in other diseases classified elsewhere, unspecified severity, with anxiety: Secondary | ICD-10-CM | POA: Diagnosis not present

## 2022-09-11 DIAGNOSIS — I130 Hypertensive heart and chronic kidney disease with heart failure and stage 1 through stage 4 chronic kidney disease, or unspecified chronic kidney disease: Secondary | ICD-10-CM | POA: Diagnosis not present

## 2022-09-11 DIAGNOSIS — I509 Heart failure, unspecified: Secondary | ICD-10-CM | POA: Diagnosis not present

## 2022-09-15 DIAGNOSIS — G20A1 Parkinson's disease without dyskinesia, without mention of fluctuations: Secondary | ICD-10-CM | POA: Diagnosis not present

## 2022-09-15 DIAGNOSIS — N1832 Chronic kidney disease, stage 3b: Secondary | ICD-10-CM | POA: Diagnosis not present

## 2022-09-15 DIAGNOSIS — F0283 Dementia in other diseases classified elsewhere, unspecified severity, with mood disturbance: Secondary | ICD-10-CM | POA: Diagnosis not present

## 2022-09-15 DIAGNOSIS — I130 Hypertensive heart and chronic kidney disease with heart failure and stage 1 through stage 4 chronic kidney disease, or unspecified chronic kidney disease: Secondary | ICD-10-CM | POA: Diagnosis not present

## 2022-09-15 DIAGNOSIS — I509 Heart failure, unspecified: Secondary | ICD-10-CM | POA: Diagnosis not present

## 2022-09-15 DIAGNOSIS — F0284 Dementia in other diseases classified elsewhere, unspecified severity, with anxiety: Secondary | ICD-10-CM | POA: Diagnosis not present

## 2022-09-22 DIAGNOSIS — I130 Hypertensive heart and chronic kidney disease with heart failure and stage 1 through stage 4 chronic kidney disease, or unspecified chronic kidney disease: Secondary | ICD-10-CM | POA: Diagnosis not present

## 2022-09-22 DIAGNOSIS — F0283 Dementia in other diseases classified elsewhere, unspecified severity, with mood disturbance: Secondary | ICD-10-CM | POA: Diagnosis not present

## 2022-09-22 DIAGNOSIS — N1832 Chronic kidney disease, stage 3b: Secondary | ICD-10-CM | POA: Diagnosis not present

## 2022-09-22 DIAGNOSIS — F0284 Dementia in other diseases classified elsewhere, unspecified severity, with anxiety: Secondary | ICD-10-CM | POA: Diagnosis not present

## 2022-09-22 DIAGNOSIS — G20A1 Parkinson's disease without dyskinesia, without mention of fluctuations: Secondary | ICD-10-CM | POA: Diagnosis not present

## 2022-09-22 DIAGNOSIS — I509 Heart failure, unspecified: Secondary | ICD-10-CM | POA: Diagnosis not present

## 2022-09-23 DIAGNOSIS — F0283 Dementia in other diseases classified elsewhere, unspecified severity, with mood disturbance: Secondary | ICD-10-CM | POA: Diagnosis not present

## 2022-09-23 DIAGNOSIS — G20A1 Parkinson's disease without dyskinesia, without mention of fluctuations: Secondary | ICD-10-CM | POA: Diagnosis not present

## 2022-09-23 DIAGNOSIS — F0284 Dementia in other diseases classified elsewhere, unspecified severity, with anxiety: Secondary | ICD-10-CM | POA: Diagnosis not present

## 2022-09-23 DIAGNOSIS — N1832 Chronic kidney disease, stage 3b: Secondary | ICD-10-CM | POA: Diagnosis not present

## 2022-09-23 DIAGNOSIS — I130 Hypertensive heart and chronic kidney disease with heart failure and stage 1 through stage 4 chronic kidney disease, or unspecified chronic kidney disease: Secondary | ICD-10-CM | POA: Diagnosis not present

## 2022-09-23 DIAGNOSIS — I509 Heart failure, unspecified: Secondary | ICD-10-CM | POA: Diagnosis not present

## 2022-09-25 DIAGNOSIS — G20A1 Parkinson's disease without dyskinesia, without mention of fluctuations: Secondary | ICD-10-CM | POA: Diagnosis not present

## 2022-09-25 DIAGNOSIS — I130 Hypertensive heart and chronic kidney disease with heart failure and stage 1 through stage 4 chronic kidney disease, or unspecified chronic kidney disease: Secondary | ICD-10-CM | POA: Diagnosis not present

## 2022-09-25 DIAGNOSIS — F0284 Dementia in other diseases classified elsewhere, unspecified severity, with anxiety: Secondary | ICD-10-CM | POA: Diagnosis not present

## 2022-09-25 DIAGNOSIS — I509 Heart failure, unspecified: Secondary | ICD-10-CM | POA: Diagnosis not present

## 2022-09-25 DIAGNOSIS — F0283 Dementia in other diseases classified elsewhere, unspecified severity, with mood disturbance: Secondary | ICD-10-CM | POA: Diagnosis not present

## 2022-09-25 DIAGNOSIS — N1832 Chronic kidney disease, stage 3b: Secondary | ICD-10-CM | POA: Diagnosis not present

## 2022-09-26 DIAGNOSIS — D509 Iron deficiency anemia, unspecified: Secondary | ICD-10-CM | POA: Diagnosis not present

## 2022-09-26 DIAGNOSIS — M810 Age-related osteoporosis without current pathological fracture: Secondary | ICD-10-CM | POA: Diagnosis not present

## 2022-09-26 DIAGNOSIS — F0283 Dementia in other diseases classified elsewhere, unspecified severity, with mood disturbance: Secondary | ICD-10-CM | POA: Diagnosis not present

## 2022-09-26 DIAGNOSIS — Z741 Need for assistance with personal care: Secondary | ICD-10-CM | POA: Diagnosis not present

## 2022-09-26 DIAGNOSIS — N1832 Chronic kidney disease, stage 3b: Secondary | ICD-10-CM | POA: Diagnosis not present

## 2022-09-26 DIAGNOSIS — I130 Hypertensive heart and chronic kidney disease with heart failure and stage 1 through stage 4 chronic kidney disease, or unspecified chronic kidney disease: Secondary | ICD-10-CM | POA: Diagnosis not present

## 2022-09-26 DIAGNOSIS — Z87310 Personal history of (healed) osteoporosis fracture: Secondary | ICD-10-CM | POA: Diagnosis not present

## 2022-09-26 DIAGNOSIS — F0284 Dementia in other diseases classified elsewhere, unspecified severity, with anxiety: Secondary | ICD-10-CM | POA: Diagnosis not present

## 2022-09-26 DIAGNOSIS — I509 Heart failure, unspecified: Secondary | ICD-10-CM | POA: Diagnosis not present

## 2022-09-26 DIAGNOSIS — R159 Full incontinence of feces: Secondary | ICD-10-CM | POA: Diagnosis not present

## 2022-09-26 DIAGNOSIS — Z6823 Body mass index (BMI) 23.0-23.9, adult: Secondary | ICD-10-CM | POA: Diagnosis not present

## 2022-09-26 DIAGNOSIS — G20A1 Parkinson's disease without dyskinesia, without mention of fluctuations: Secondary | ICD-10-CM | POA: Diagnosis not present

## 2022-09-26 DIAGNOSIS — E785 Hyperlipidemia, unspecified: Secondary | ICD-10-CM | POA: Diagnosis not present

## 2022-09-26 DIAGNOSIS — R32 Unspecified urinary incontinence: Secondary | ICD-10-CM | POA: Diagnosis not present

## 2022-10-01 DIAGNOSIS — I130 Hypertensive heart and chronic kidney disease with heart failure and stage 1 through stage 4 chronic kidney disease, or unspecified chronic kidney disease: Secondary | ICD-10-CM | POA: Diagnosis not present

## 2022-10-01 DIAGNOSIS — N1832 Chronic kidney disease, stage 3b: Secondary | ICD-10-CM | POA: Diagnosis not present

## 2022-10-01 DIAGNOSIS — G20A1 Parkinson's disease without dyskinesia, without mention of fluctuations: Secondary | ICD-10-CM | POA: Diagnosis not present

## 2022-10-01 DIAGNOSIS — F0284 Dementia in other diseases classified elsewhere, unspecified severity, with anxiety: Secondary | ICD-10-CM | POA: Diagnosis not present

## 2022-10-01 DIAGNOSIS — F0283 Dementia in other diseases classified elsewhere, unspecified severity, with mood disturbance: Secondary | ICD-10-CM | POA: Diagnosis not present

## 2022-10-01 DIAGNOSIS — I509 Heart failure, unspecified: Secondary | ICD-10-CM | POA: Diagnosis not present

## 2022-10-07 DIAGNOSIS — G20A1 Parkinson's disease without dyskinesia, without mention of fluctuations: Secondary | ICD-10-CM | POA: Diagnosis not present

## 2022-10-07 DIAGNOSIS — N1832 Chronic kidney disease, stage 3b: Secondary | ICD-10-CM | POA: Diagnosis not present

## 2022-10-07 DIAGNOSIS — F0283 Dementia in other diseases classified elsewhere, unspecified severity, with mood disturbance: Secondary | ICD-10-CM | POA: Diagnosis not present

## 2022-10-07 DIAGNOSIS — I130 Hypertensive heart and chronic kidney disease with heart failure and stage 1 through stage 4 chronic kidney disease, or unspecified chronic kidney disease: Secondary | ICD-10-CM | POA: Diagnosis not present

## 2022-10-07 DIAGNOSIS — I509 Heart failure, unspecified: Secondary | ICD-10-CM | POA: Diagnosis not present

## 2022-10-07 DIAGNOSIS — F0284 Dementia in other diseases classified elsewhere, unspecified severity, with anxiety: Secondary | ICD-10-CM | POA: Diagnosis not present

## 2022-10-09 DIAGNOSIS — G20A1 Parkinson's disease without dyskinesia, without mention of fluctuations: Secondary | ICD-10-CM | POA: Diagnosis not present

## 2022-10-09 DIAGNOSIS — F0283 Dementia in other diseases classified elsewhere, unspecified severity, with mood disturbance: Secondary | ICD-10-CM | POA: Diagnosis not present

## 2022-10-09 DIAGNOSIS — N1832 Chronic kidney disease, stage 3b: Secondary | ICD-10-CM | POA: Diagnosis not present

## 2022-10-09 DIAGNOSIS — I130 Hypertensive heart and chronic kidney disease with heart failure and stage 1 through stage 4 chronic kidney disease, or unspecified chronic kidney disease: Secondary | ICD-10-CM | POA: Diagnosis not present

## 2022-10-09 DIAGNOSIS — F0284 Dementia in other diseases classified elsewhere, unspecified severity, with anxiety: Secondary | ICD-10-CM | POA: Diagnosis not present

## 2022-10-09 DIAGNOSIS — I509 Heart failure, unspecified: Secondary | ICD-10-CM | POA: Diagnosis not present

## 2022-10-14 DIAGNOSIS — G20A1 Parkinson's disease without dyskinesia, without mention of fluctuations: Secondary | ICD-10-CM | POA: Diagnosis not present

## 2022-10-14 DIAGNOSIS — N1832 Chronic kidney disease, stage 3b: Secondary | ICD-10-CM | POA: Diagnosis not present

## 2022-10-14 DIAGNOSIS — I130 Hypertensive heart and chronic kidney disease with heart failure and stage 1 through stage 4 chronic kidney disease, or unspecified chronic kidney disease: Secondary | ICD-10-CM | POA: Diagnosis not present

## 2022-10-14 DIAGNOSIS — F0284 Dementia in other diseases classified elsewhere, unspecified severity, with anxiety: Secondary | ICD-10-CM | POA: Diagnosis not present

## 2022-10-14 DIAGNOSIS — F0283 Dementia in other diseases classified elsewhere, unspecified severity, with mood disturbance: Secondary | ICD-10-CM | POA: Diagnosis not present

## 2022-10-14 DIAGNOSIS — I509 Heart failure, unspecified: Secondary | ICD-10-CM | POA: Diagnosis not present

## 2022-10-16 DIAGNOSIS — I130 Hypertensive heart and chronic kidney disease with heart failure and stage 1 through stage 4 chronic kidney disease, or unspecified chronic kidney disease: Secondary | ICD-10-CM | POA: Diagnosis not present

## 2022-10-16 DIAGNOSIS — F0284 Dementia in other diseases classified elsewhere, unspecified severity, with anxiety: Secondary | ICD-10-CM | POA: Diagnosis not present

## 2022-10-16 DIAGNOSIS — G20A1 Parkinson's disease without dyskinesia, without mention of fluctuations: Secondary | ICD-10-CM | POA: Diagnosis not present

## 2022-10-16 DIAGNOSIS — F0283 Dementia in other diseases classified elsewhere, unspecified severity, with mood disturbance: Secondary | ICD-10-CM | POA: Diagnosis not present

## 2022-10-16 DIAGNOSIS — I509 Heart failure, unspecified: Secondary | ICD-10-CM | POA: Diagnosis not present

## 2022-10-16 DIAGNOSIS — N1832 Chronic kidney disease, stage 3b: Secondary | ICD-10-CM | POA: Diagnosis not present

## 2022-10-20 DIAGNOSIS — G20A1 Parkinson's disease without dyskinesia, without mention of fluctuations: Secondary | ICD-10-CM | POA: Diagnosis not present

## 2022-10-20 DIAGNOSIS — I130 Hypertensive heart and chronic kidney disease with heart failure and stage 1 through stage 4 chronic kidney disease, or unspecified chronic kidney disease: Secondary | ICD-10-CM | POA: Diagnosis not present

## 2022-10-20 DIAGNOSIS — F0284 Dementia in other diseases classified elsewhere, unspecified severity, with anxiety: Secondary | ICD-10-CM | POA: Diagnosis not present

## 2022-10-20 DIAGNOSIS — I509 Heart failure, unspecified: Secondary | ICD-10-CM | POA: Diagnosis not present

## 2022-10-20 DIAGNOSIS — N1832 Chronic kidney disease, stage 3b: Secondary | ICD-10-CM | POA: Diagnosis not present

## 2022-10-20 DIAGNOSIS — F0283 Dementia in other diseases classified elsewhere, unspecified severity, with mood disturbance: Secondary | ICD-10-CM | POA: Diagnosis not present

## 2022-10-21 DIAGNOSIS — F0284 Dementia in other diseases classified elsewhere, unspecified severity, with anxiety: Secondary | ICD-10-CM | POA: Diagnosis not present

## 2022-10-21 DIAGNOSIS — I509 Heart failure, unspecified: Secondary | ICD-10-CM | POA: Diagnosis not present

## 2022-10-21 DIAGNOSIS — I130 Hypertensive heart and chronic kidney disease with heart failure and stage 1 through stage 4 chronic kidney disease, or unspecified chronic kidney disease: Secondary | ICD-10-CM | POA: Diagnosis not present

## 2022-10-21 DIAGNOSIS — N1832 Chronic kidney disease, stage 3b: Secondary | ICD-10-CM | POA: Diagnosis not present

## 2022-10-21 DIAGNOSIS — G20A1 Parkinson's disease without dyskinesia, without mention of fluctuations: Secondary | ICD-10-CM | POA: Diagnosis not present

## 2022-10-21 DIAGNOSIS — F0283 Dementia in other diseases classified elsewhere, unspecified severity, with mood disturbance: Secondary | ICD-10-CM | POA: Diagnosis not present

## 2022-10-23 DIAGNOSIS — N1832 Chronic kidney disease, stage 3b: Secondary | ICD-10-CM | POA: Diagnosis not present

## 2022-10-23 DIAGNOSIS — F0283 Dementia in other diseases classified elsewhere, unspecified severity, with mood disturbance: Secondary | ICD-10-CM | POA: Diagnosis not present

## 2022-10-23 DIAGNOSIS — F0284 Dementia in other diseases classified elsewhere, unspecified severity, with anxiety: Secondary | ICD-10-CM | POA: Diagnosis not present

## 2022-10-23 DIAGNOSIS — I509 Heart failure, unspecified: Secondary | ICD-10-CM | POA: Diagnosis not present

## 2022-10-23 DIAGNOSIS — I130 Hypertensive heart and chronic kidney disease with heart failure and stage 1 through stage 4 chronic kidney disease, or unspecified chronic kidney disease: Secondary | ICD-10-CM | POA: Diagnosis not present

## 2022-10-23 DIAGNOSIS — G20A1 Parkinson's disease without dyskinesia, without mention of fluctuations: Secondary | ICD-10-CM | POA: Diagnosis not present

## 2022-10-27 DIAGNOSIS — Z6823 Body mass index (BMI) 23.0-23.9, adult: Secondary | ICD-10-CM | POA: Diagnosis not present

## 2022-10-27 DIAGNOSIS — E785 Hyperlipidemia, unspecified: Secondary | ICD-10-CM | POA: Diagnosis not present

## 2022-10-27 DIAGNOSIS — M810 Age-related osteoporosis without current pathological fracture: Secondary | ICD-10-CM | POA: Diagnosis not present

## 2022-10-27 DIAGNOSIS — Z87310 Personal history of (healed) osteoporosis fracture: Secondary | ICD-10-CM | POA: Diagnosis not present

## 2022-10-27 DIAGNOSIS — F0283 Dementia in other diseases classified elsewhere, unspecified severity, with mood disturbance: Secondary | ICD-10-CM | POA: Diagnosis not present

## 2022-10-27 DIAGNOSIS — I130 Hypertensive heart and chronic kidney disease with heart failure and stage 1 through stage 4 chronic kidney disease, or unspecified chronic kidney disease: Secondary | ICD-10-CM | POA: Diagnosis not present

## 2022-10-27 DIAGNOSIS — G20A1 Parkinson's disease without dyskinesia, without mention of fluctuations: Secondary | ICD-10-CM | POA: Diagnosis not present

## 2022-10-27 DIAGNOSIS — R159 Full incontinence of feces: Secondary | ICD-10-CM | POA: Diagnosis not present

## 2022-10-27 DIAGNOSIS — R32 Unspecified urinary incontinence: Secondary | ICD-10-CM | POA: Diagnosis not present

## 2022-10-27 DIAGNOSIS — N1832 Chronic kidney disease, stage 3b: Secondary | ICD-10-CM | POA: Diagnosis not present

## 2022-10-27 DIAGNOSIS — D509 Iron deficiency anemia, unspecified: Secondary | ICD-10-CM | POA: Diagnosis not present

## 2022-10-27 DIAGNOSIS — I509 Heart failure, unspecified: Secondary | ICD-10-CM | POA: Diagnosis not present

## 2022-10-27 DIAGNOSIS — F0284 Dementia in other diseases classified elsewhere, unspecified severity, with anxiety: Secondary | ICD-10-CM | POA: Diagnosis not present

## 2022-10-27 DIAGNOSIS — Z741 Need for assistance with personal care: Secondary | ICD-10-CM | POA: Diagnosis not present

## 2022-10-28 DIAGNOSIS — F0283 Dementia in other diseases classified elsewhere, unspecified severity, with mood disturbance: Secondary | ICD-10-CM | POA: Diagnosis not present

## 2022-10-28 DIAGNOSIS — I130 Hypertensive heart and chronic kidney disease with heart failure and stage 1 through stage 4 chronic kidney disease, or unspecified chronic kidney disease: Secondary | ICD-10-CM | POA: Diagnosis not present

## 2022-10-28 DIAGNOSIS — F0284 Dementia in other diseases classified elsewhere, unspecified severity, with anxiety: Secondary | ICD-10-CM | POA: Diagnosis not present

## 2022-10-28 DIAGNOSIS — I509 Heart failure, unspecified: Secondary | ICD-10-CM | POA: Diagnosis not present

## 2022-10-28 DIAGNOSIS — G20A1 Parkinson's disease without dyskinesia, without mention of fluctuations: Secondary | ICD-10-CM | POA: Diagnosis not present

## 2022-10-28 DIAGNOSIS — N1832 Chronic kidney disease, stage 3b: Secondary | ICD-10-CM | POA: Diagnosis not present

## 2022-10-30 DIAGNOSIS — I130 Hypertensive heart and chronic kidney disease with heart failure and stage 1 through stage 4 chronic kidney disease, or unspecified chronic kidney disease: Secondary | ICD-10-CM | POA: Diagnosis not present

## 2022-10-30 DIAGNOSIS — N1832 Chronic kidney disease, stage 3b: Secondary | ICD-10-CM | POA: Diagnosis not present

## 2022-10-30 DIAGNOSIS — F0284 Dementia in other diseases classified elsewhere, unspecified severity, with anxiety: Secondary | ICD-10-CM | POA: Diagnosis not present

## 2022-10-30 DIAGNOSIS — G20A1 Parkinson's disease without dyskinesia, without mention of fluctuations: Secondary | ICD-10-CM | POA: Diagnosis not present

## 2022-10-30 DIAGNOSIS — F0283 Dementia in other diseases classified elsewhere, unspecified severity, with mood disturbance: Secondary | ICD-10-CM | POA: Diagnosis not present

## 2022-10-30 DIAGNOSIS — I517 Cardiomegaly: Secondary | ICD-10-CM | POA: Diagnosis not present

## 2022-10-30 DIAGNOSIS — I509 Heart failure, unspecified: Secondary | ICD-10-CM | POA: Diagnosis not present

## 2022-10-30 DIAGNOSIS — Z950 Presence of cardiac pacemaker: Secondary | ICD-10-CM | POA: Diagnosis not present

## 2022-10-31 ENCOUNTER — Ambulatory Visit (INDEPENDENT_AMBULATORY_CARE_PROVIDER_SITE_OTHER): Payer: Medicare Other

## 2022-10-31 DIAGNOSIS — F0283 Dementia in other diseases classified elsewhere, unspecified severity, with mood disturbance: Secondary | ICD-10-CM | POA: Diagnosis not present

## 2022-10-31 DIAGNOSIS — N1832 Chronic kidney disease, stage 3b: Secondary | ICD-10-CM | POA: Diagnosis not present

## 2022-10-31 DIAGNOSIS — I495 Sick sinus syndrome: Secondary | ICD-10-CM | POA: Diagnosis not present

## 2022-10-31 DIAGNOSIS — F0284 Dementia in other diseases classified elsewhere, unspecified severity, with anxiety: Secondary | ICD-10-CM | POA: Diagnosis not present

## 2022-10-31 DIAGNOSIS — I130 Hypertensive heart and chronic kidney disease with heart failure and stage 1 through stage 4 chronic kidney disease, or unspecified chronic kidney disease: Secondary | ICD-10-CM | POA: Diagnosis not present

## 2022-10-31 DIAGNOSIS — G20A1 Parkinson's disease without dyskinesia, without mention of fluctuations: Secondary | ICD-10-CM | POA: Diagnosis not present

## 2022-10-31 DIAGNOSIS — I509 Heart failure, unspecified: Secondary | ICD-10-CM | POA: Diagnosis not present

## 2022-10-31 LAB — CUP PACEART REMOTE DEVICE CHECK
Battery Remaining Longevity: 102 mo
Battery Remaining Percentage: 98 %
Brady Statistic RA Percent Paced: 32 %
Brady Statistic RV Percent Paced: 0 %
Date Time Interrogation Session: 20240405030100
Implantable Lead Connection Status: 753985
Implantable Lead Connection Status: 753985
Implantable Lead Implant Date: 20171009
Implantable Lead Implant Date: 20171009
Implantable Lead Location: 753859
Implantable Lead Location: 753860
Implantable Lead Model: 5076
Implantable Lead Model: 7741
Implantable Lead Serial Number: 785430
Implantable Pulse Generator Implant Date: 20171009
Lead Channel Impedance Value: 1807 Ohm
Lead Channel Impedance Value: 459 Ohm
Lead Channel Setting Pacing Amplitude: 2 V
Lead Channel Setting Pacing Amplitude: 2.6 V
Lead Channel Setting Pacing Pulse Width: 1 ms
Lead Channel Setting Sensing Sensitivity: 2.5 mV
Pulse Gen Serial Number: 766467
Zone Setting Status: 755011

## 2022-11-01 DIAGNOSIS — I130 Hypertensive heart and chronic kidney disease with heart failure and stage 1 through stage 4 chronic kidney disease, or unspecified chronic kidney disease: Secondary | ICD-10-CM | POA: Diagnosis not present

## 2022-11-01 DIAGNOSIS — F0283 Dementia in other diseases classified elsewhere, unspecified severity, with mood disturbance: Secondary | ICD-10-CM | POA: Diagnosis not present

## 2022-11-01 DIAGNOSIS — N1832 Chronic kidney disease, stage 3b: Secondary | ICD-10-CM | POA: Diagnosis not present

## 2022-11-01 DIAGNOSIS — G20A1 Parkinson's disease without dyskinesia, without mention of fluctuations: Secondary | ICD-10-CM | POA: Diagnosis not present

## 2022-11-01 DIAGNOSIS — F0284 Dementia in other diseases classified elsewhere, unspecified severity, with anxiety: Secondary | ICD-10-CM | POA: Diagnosis not present

## 2022-11-01 DIAGNOSIS — I509 Heart failure, unspecified: Secondary | ICD-10-CM | POA: Diagnosis not present

## 2022-11-03 DIAGNOSIS — I509 Heart failure, unspecified: Secondary | ICD-10-CM | POA: Diagnosis not present

## 2022-11-03 DIAGNOSIS — N1832 Chronic kidney disease, stage 3b: Secondary | ICD-10-CM | POA: Diagnosis not present

## 2022-11-03 DIAGNOSIS — I130 Hypertensive heart and chronic kidney disease with heart failure and stage 1 through stage 4 chronic kidney disease, or unspecified chronic kidney disease: Secondary | ICD-10-CM | POA: Diagnosis not present

## 2022-11-03 DIAGNOSIS — F0284 Dementia in other diseases classified elsewhere, unspecified severity, with anxiety: Secondary | ICD-10-CM | POA: Diagnosis not present

## 2022-11-03 DIAGNOSIS — G20A1 Parkinson's disease without dyskinesia, without mention of fluctuations: Secondary | ICD-10-CM | POA: Diagnosis not present

## 2022-11-03 DIAGNOSIS — F0283 Dementia in other diseases classified elsewhere, unspecified severity, with mood disturbance: Secondary | ICD-10-CM | POA: Diagnosis not present

## 2022-11-04 DIAGNOSIS — I509 Heart failure, unspecified: Secondary | ICD-10-CM | POA: Diagnosis not present

## 2022-11-04 DIAGNOSIS — F0284 Dementia in other diseases classified elsewhere, unspecified severity, with anxiety: Secondary | ICD-10-CM | POA: Diagnosis not present

## 2022-11-04 DIAGNOSIS — F0283 Dementia in other diseases classified elsewhere, unspecified severity, with mood disturbance: Secondary | ICD-10-CM | POA: Diagnosis not present

## 2022-11-04 DIAGNOSIS — N1832 Chronic kidney disease, stage 3b: Secondary | ICD-10-CM | POA: Diagnosis not present

## 2022-11-04 DIAGNOSIS — I130 Hypertensive heart and chronic kidney disease with heart failure and stage 1 through stage 4 chronic kidney disease, or unspecified chronic kidney disease: Secondary | ICD-10-CM | POA: Diagnosis not present

## 2022-11-04 DIAGNOSIS — G20A1 Parkinson's disease without dyskinesia, without mention of fluctuations: Secondary | ICD-10-CM | POA: Diagnosis not present

## 2022-11-07 DIAGNOSIS — N1832 Chronic kidney disease, stage 3b: Secondary | ICD-10-CM | POA: Diagnosis not present

## 2022-11-07 DIAGNOSIS — I509 Heart failure, unspecified: Secondary | ICD-10-CM | POA: Diagnosis not present

## 2022-11-07 DIAGNOSIS — I130 Hypertensive heart and chronic kidney disease with heart failure and stage 1 through stage 4 chronic kidney disease, or unspecified chronic kidney disease: Secondary | ICD-10-CM | POA: Diagnosis not present

## 2022-11-07 DIAGNOSIS — F0284 Dementia in other diseases classified elsewhere, unspecified severity, with anxiety: Secondary | ICD-10-CM | POA: Diagnosis not present

## 2022-11-07 DIAGNOSIS — G20A1 Parkinson's disease without dyskinesia, without mention of fluctuations: Secondary | ICD-10-CM | POA: Diagnosis not present

## 2022-11-07 DIAGNOSIS — F0283 Dementia in other diseases classified elsewhere, unspecified severity, with mood disturbance: Secondary | ICD-10-CM | POA: Diagnosis not present

## 2022-11-13 DIAGNOSIS — I509 Heart failure, unspecified: Secondary | ICD-10-CM | POA: Diagnosis not present

## 2022-11-13 DIAGNOSIS — F0283 Dementia in other diseases classified elsewhere, unspecified severity, with mood disturbance: Secondary | ICD-10-CM | POA: Diagnosis not present

## 2022-11-13 DIAGNOSIS — N1832 Chronic kidney disease, stage 3b: Secondary | ICD-10-CM | POA: Diagnosis not present

## 2022-11-13 DIAGNOSIS — G20A1 Parkinson's disease without dyskinesia, without mention of fluctuations: Secondary | ICD-10-CM | POA: Diagnosis not present

## 2022-11-13 DIAGNOSIS — I130 Hypertensive heart and chronic kidney disease with heart failure and stage 1 through stage 4 chronic kidney disease, or unspecified chronic kidney disease: Secondary | ICD-10-CM | POA: Diagnosis not present

## 2022-11-13 DIAGNOSIS — F0284 Dementia in other diseases classified elsewhere, unspecified severity, with anxiety: Secondary | ICD-10-CM | POA: Diagnosis not present

## 2022-11-16 DIAGNOSIS — G20A1 Parkinson's disease without dyskinesia, without mention of fluctuations: Secondary | ICD-10-CM | POA: Diagnosis not present

## 2022-11-16 DIAGNOSIS — N1832 Chronic kidney disease, stage 3b: Secondary | ICD-10-CM | POA: Diagnosis not present

## 2022-11-16 DIAGNOSIS — F0284 Dementia in other diseases classified elsewhere, unspecified severity, with anxiety: Secondary | ICD-10-CM | POA: Diagnosis not present

## 2022-11-16 DIAGNOSIS — F0283 Dementia in other diseases classified elsewhere, unspecified severity, with mood disturbance: Secondary | ICD-10-CM | POA: Diagnosis not present

## 2022-11-16 DIAGNOSIS — I509 Heart failure, unspecified: Secondary | ICD-10-CM | POA: Diagnosis not present

## 2022-11-16 DIAGNOSIS — I130 Hypertensive heart and chronic kidney disease with heart failure and stage 1 through stage 4 chronic kidney disease, or unspecified chronic kidney disease: Secondary | ICD-10-CM | POA: Diagnosis not present

## 2022-11-17 DIAGNOSIS — F0283 Dementia in other diseases classified elsewhere, unspecified severity, with mood disturbance: Secondary | ICD-10-CM | POA: Diagnosis not present

## 2022-11-17 DIAGNOSIS — I130 Hypertensive heart and chronic kidney disease with heart failure and stage 1 through stage 4 chronic kidney disease, or unspecified chronic kidney disease: Secondary | ICD-10-CM | POA: Diagnosis not present

## 2022-11-17 DIAGNOSIS — N1832 Chronic kidney disease, stage 3b: Secondary | ICD-10-CM | POA: Diagnosis not present

## 2022-11-17 DIAGNOSIS — G20A1 Parkinson's disease without dyskinesia, without mention of fluctuations: Secondary | ICD-10-CM | POA: Diagnosis not present

## 2022-11-17 DIAGNOSIS — I509 Heart failure, unspecified: Secondary | ICD-10-CM | POA: Diagnosis not present

## 2022-11-17 DIAGNOSIS — F0284 Dementia in other diseases classified elsewhere, unspecified severity, with anxiety: Secondary | ICD-10-CM | POA: Diagnosis not present

## 2022-11-20 DIAGNOSIS — G20A1 Parkinson's disease without dyskinesia, without mention of fluctuations: Secondary | ICD-10-CM | POA: Diagnosis not present

## 2022-11-20 DIAGNOSIS — N1832 Chronic kidney disease, stage 3b: Secondary | ICD-10-CM | POA: Diagnosis not present

## 2022-11-20 DIAGNOSIS — I130 Hypertensive heart and chronic kidney disease with heart failure and stage 1 through stage 4 chronic kidney disease, or unspecified chronic kidney disease: Secondary | ICD-10-CM | POA: Diagnosis not present

## 2022-11-20 DIAGNOSIS — F0284 Dementia in other diseases classified elsewhere, unspecified severity, with anxiety: Secondary | ICD-10-CM | POA: Diagnosis not present

## 2022-11-20 DIAGNOSIS — F0283 Dementia in other diseases classified elsewhere, unspecified severity, with mood disturbance: Secondary | ICD-10-CM | POA: Diagnosis not present

## 2022-11-20 DIAGNOSIS — I509 Heart failure, unspecified: Secondary | ICD-10-CM | POA: Diagnosis not present

## 2022-11-21 DIAGNOSIS — F0284 Dementia in other diseases classified elsewhere, unspecified severity, with anxiety: Secondary | ICD-10-CM | POA: Diagnosis not present

## 2022-11-21 DIAGNOSIS — N1832 Chronic kidney disease, stage 3b: Secondary | ICD-10-CM | POA: Diagnosis not present

## 2022-11-21 DIAGNOSIS — I130 Hypertensive heart and chronic kidney disease with heart failure and stage 1 through stage 4 chronic kidney disease, or unspecified chronic kidney disease: Secondary | ICD-10-CM | POA: Diagnosis not present

## 2022-11-21 DIAGNOSIS — G20A1 Parkinson's disease without dyskinesia, without mention of fluctuations: Secondary | ICD-10-CM | POA: Diagnosis not present

## 2022-11-21 DIAGNOSIS — F0283 Dementia in other diseases classified elsewhere, unspecified severity, with mood disturbance: Secondary | ICD-10-CM | POA: Diagnosis not present

## 2022-11-21 DIAGNOSIS — I509 Heart failure, unspecified: Secondary | ICD-10-CM | POA: Diagnosis not present

## 2022-11-26 DIAGNOSIS — D509 Iron deficiency anemia, unspecified: Secondary | ICD-10-CM | POA: Diagnosis not present

## 2022-11-26 DIAGNOSIS — R159 Full incontinence of feces: Secondary | ICD-10-CM | POA: Diagnosis not present

## 2022-11-26 DIAGNOSIS — Z6823 Body mass index (BMI) 23.0-23.9, adult: Secondary | ICD-10-CM | POA: Diagnosis not present

## 2022-11-26 DIAGNOSIS — I509 Heart failure, unspecified: Secondary | ICD-10-CM | POA: Diagnosis not present

## 2022-11-26 DIAGNOSIS — M810 Age-related osteoporosis without current pathological fracture: Secondary | ICD-10-CM | POA: Diagnosis not present

## 2022-11-26 DIAGNOSIS — F0283 Dementia in other diseases classified elsewhere, unspecified severity, with mood disturbance: Secondary | ICD-10-CM | POA: Diagnosis not present

## 2022-11-26 DIAGNOSIS — E785 Hyperlipidemia, unspecified: Secondary | ICD-10-CM | POA: Diagnosis not present

## 2022-11-26 DIAGNOSIS — R32 Unspecified urinary incontinence: Secondary | ICD-10-CM | POA: Diagnosis not present

## 2022-11-26 DIAGNOSIS — D045 Carcinoma in situ of skin of trunk: Secondary | ICD-10-CM | POA: Diagnosis not present

## 2022-11-26 DIAGNOSIS — B078 Other viral warts: Secondary | ICD-10-CM | POA: Diagnosis not present

## 2022-11-26 DIAGNOSIS — F0284 Dementia in other diseases classified elsewhere, unspecified severity, with anxiety: Secondary | ICD-10-CM | POA: Diagnosis not present

## 2022-11-26 DIAGNOSIS — Z87310 Personal history of (healed) osteoporosis fracture: Secondary | ICD-10-CM | POA: Diagnosis not present

## 2022-11-26 DIAGNOSIS — Z741 Need for assistance with personal care: Secondary | ICD-10-CM | POA: Diagnosis not present

## 2022-11-26 DIAGNOSIS — L309 Dermatitis, unspecified: Secondary | ICD-10-CM | POA: Diagnosis not present

## 2022-11-26 DIAGNOSIS — D485 Neoplasm of uncertain behavior of skin: Secondary | ICD-10-CM | POA: Diagnosis not present

## 2022-11-26 DIAGNOSIS — B009 Herpesviral infection, unspecified: Secondary | ICD-10-CM | POA: Diagnosis not present

## 2022-11-26 DIAGNOSIS — G20A1 Parkinson's disease without dyskinesia, without mention of fluctuations: Secondary | ICD-10-CM | POA: Diagnosis not present

## 2022-11-26 DIAGNOSIS — I130 Hypertensive heart and chronic kidney disease with heart failure and stage 1 through stage 4 chronic kidney disease, or unspecified chronic kidney disease: Secondary | ICD-10-CM | POA: Diagnosis not present

## 2022-11-26 DIAGNOSIS — N1832 Chronic kidney disease, stage 3b: Secondary | ICD-10-CM | POA: Diagnosis not present

## 2022-11-27 ENCOUNTER — Telehealth: Payer: Self-pay | Admitting: Radiology

## 2022-11-27 NOTE — Telephone Encounter (Signed)
Contacted [name] to schedule their annual wellness visit.  LVM for pt to call back to schedule AWV appt. Please schedule if she returns call.   Zi Sek K.  CMA

## 2022-12-02 DIAGNOSIS — N1832 Chronic kidney disease, stage 3b: Secondary | ICD-10-CM | POA: Diagnosis not present

## 2022-12-02 DIAGNOSIS — F0283 Dementia in other diseases classified elsewhere, unspecified severity, with mood disturbance: Secondary | ICD-10-CM | POA: Diagnosis not present

## 2022-12-02 DIAGNOSIS — F0284 Dementia in other diseases classified elsewhere, unspecified severity, with anxiety: Secondary | ICD-10-CM | POA: Diagnosis not present

## 2022-12-02 DIAGNOSIS — I509 Heart failure, unspecified: Secondary | ICD-10-CM | POA: Diagnosis not present

## 2022-12-02 DIAGNOSIS — I130 Hypertensive heart and chronic kidney disease with heart failure and stage 1 through stage 4 chronic kidney disease, or unspecified chronic kidney disease: Secondary | ICD-10-CM | POA: Diagnosis not present

## 2022-12-02 DIAGNOSIS — G20A1 Parkinson's disease without dyskinesia, without mention of fluctuations: Secondary | ICD-10-CM | POA: Diagnosis not present

## 2022-12-02 NOTE — Progress Notes (Signed)
Remote pacemaker transmission.   

## 2022-12-05 DIAGNOSIS — F0284 Dementia in other diseases classified elsewhere, unspecified severity, with anxiety: Secondary | ICD-10-CM | POA: Diagnosis not present

## 2022-12-05 DIAGNOSIS — I130 Hypertensive heart and chronic kidney disease with heart failure and stage 1 through stage 4 chronic kidney disease, or unspecified chronic kidney disease: Secondary | ICD-10-CM | POA: Diagnosis not present

## 2022-12-05 DIAGNOSIS — G20A1 Parkinson's disease without dyskinesia, without mention of fluctuations: Secondary | ICD-10-CM | POA: Diagnosis not present

## 2022-12-05 DIAGNOSIS — F0283 Dementia in other diseases classified elsewhere, unspecified severity, with mood disturbance: Secondary | ICD-10-CM | POA: Diagnosis not present

## 2022-12-05 DIAGNOSIS — N1832 Chronic kidney disease, stage 3b: Secondary | ICD-10-CM | POA: Diagnosis not present

## 2022-12-05 DIAGNOSIS — I509 Heart failure, unspecified: Secondary | ICD-10-CM | POA: Diagnosis not present

## 2022-12-08 ENCOUNTER — Encounter (HOSPITAL_COMMUNITY): Payer: Self-pay

## 2022-12-08 ENCOUNTER — Inpatient Hospital Stay (HOSPITAL_COMMUNITY)
Admission: EM | Admit: 2022-12-08 | Discharge: 2022-12-12 | DRG: 522 | Disposition: A | Discharge status: Home / Self Care | Attending: Family Medicine | Admitting: Family Medicine

## 2022-12-08 ENCOUNTER — Emergency Department (HOSPITAL_COMMUNITY)

## 2022-12-08 ENCOUNTER — Other Ambulatory Visit: Payer: Self-pay

## 2022-12-08 DIAGNOSIS — D631 Anemia in chronic kidney disease: Secondary | ICD-10-CM | POA: Diagnosis not present

## 2022-12-08 DIAGNOSIS — Z833 Family history of diabetes mellitus: Secondary | ICD-10-CM | POA: Diagnosis not present

## 2022-12-08 DIAGNOSIS — Z9882 Breast implant status: Secondary | ICD-10-CM

## 2022-12-08 DIAGNOSIS — Z888 Allergy status to other drugs, medicaments and biological substances status: Secondary | ICD-10-CM

## 2022-12-08 DIAGNOSIS — G20A1 Parkinson's disease without dyskinesia, without mention of fluctuations: Secondary | ICD-10-CM | POA: Diagnosis not present

## 2022-12-08 DIAGNOSIS — Z86010 Personal history of colonic polyps: Secondary | ICD-10-CM

## 2022-12-08 DIAGNOSIS — Z66 Do not resuscitate: Secondary | ICD-10-CM | POA: Diagnosis present

## 2022-12-08 DIAGNOSIS — M810 Age-related osteoporosis without current pathological fracture: Secondary | ICD-10-CM | POA: Diagnosis present

## 2022-12-08 DIAGNOSIS — M1711 Unilateral primary osteoarthritis, right knee: Secondary | ICD-10-CM | POA: Diagnosis not present

## 2022-12-08 DIAGNOSIS — F411 Generalized anxiety disorder: Secondary | ICD-10-CM | POA: Diagnosis present

## 2022-12-08 DIAGNOSIS — S72001A Fracture of unspecified part of neck of right femur, initial encounter for closed fracture: Secondary | ICD-10-CM | POA: Diagnosis present

## 2022-12-08 DIAGNOSIS — Z981 Arthrodesis status: Secondary | ICD-10-CM

## 2022-12-08 DIAGNOSIS — F0283 Dementia in other diseases classified elsewhere, unspecified severity, with mood disturbance: Secondary | ICD-10-CM | POA: Diagnosis present

## 2022-12-08 DIAGNOSIS — S199XXA Unspecified injury of neck, initial encounter: Secondary | ICD-10-CM | POA: Diagnosis not present

## 2022-12-08 DIAGNOSIS — I130 Hypertensive heart and chronic kidney disease with heart failure and stage 1 through stage 4 chronic kidney disease, or unspecified chronic kidney disease: Secondary | ICD-10-CM | POA: Diagnosis present

## 2022-12-08 DIAGNOSIS — N1832 Chronic kidney disease, stage 3b: Secondary | ICD-10-CM | POA: Diagnosis not present

## 2022-12-08 DIAGNOSIS — W1830XA Fall on same level, unspecified, initial encounter: Secondary | ICD-10-CM | POA: Diagnosis present

## 2022-12-08 DIAGNOSIS — K219 Gastro-esophageal reflux disease without esophagitis: Secondary | ICD-10-CM | POA: Diagnosis present

## 2022-12-08 DIAGNOSIS — Z87310 Personal history of (healed) osteoporosis fracture: Secondary | ICD-10-CM | POA: Diagnosis not present

## 2022-12-08 DIAGNOSIS — N189 Chronic kidney disease, unspecified: Secondary | ICD-10-CM | POA: Diagnosis not present

## 2022-12-08 DIAGNOSIS — I495 Sick sinus syndrome: Secondary | ICD-10-CM | POA: Diagnosis present

## 2022-12-08 DIAGNOSIS — R7303 Prediabetes: Secondary | ICD-10-CM | POA: Diagnosis present

## 2022-12-08 DIAGNOSIS — R519 Headache, unspecified: Secondary | ICD-10-CM | POA: Diagnosis not present

## 2022-12-08 DIAGNOSIS — Z96641 Presence of right artificial hip joint: Secondary | ICD-10-CM | POA: Diagnosis not present

## 2022-12-08 DIAGNOSIS — S72011A Unspecified intracapsular fracture of right femur, initial encounter for closed fracture: Secondary | ICD-10-CM | POA: Diagnosis present

## 2022-12-08 DIAGNOSIS — N184 Chronic kidney disease, stage 4 (severe): Secondary | ICD-10-CM | POA: Diagnosis present

## 2022-12-08 DIAGNOSIS — B9689 Other specified bacterial agents as the cause of diseases classified elsewhere: Secondary | ICD-10-CM | POA: Diagnosis present

## 2022-12-08 DIAGNOSIS — Z950 Presence of cardiac pacemaker: Secondary | ICD-10-CM | POA: Diagnosis not present

## 2022-12-08 DIAGNOSIS — I5032 Chronic diastolic (congestive) heart failure: Secondary | ICD-10-CM | POA: Diagnosis present

## 2022-12-08 DIAGNOSIS — G8911 Acute pain due to trauma: Secondary | ICD-10-CM | POA: Diagnosis not present

## 2022-12-08 DIAGNOSIS — F0284 Dementia in other diseases classified elsewhere, unspecified severity, with anxiety: Secondary | ICD-10-CM | POA: Diagnosis present

## 2022-12-08 DIAGNOSIS — D649 Anemia, unspecified: Secondary | ICD-10-CM | POA: Diagnosis present

## 2022-12-08 DIAGNOSIS — Z79899 Other long term (current) drug therapy: Secondary | ICD-10-CM

## 2022-12-08 DIAGNOSIS — E785 Hyperlipidemia, unspecified: Secondary | ICD-10-CM | POA: Diagnosis present

## 2022-12-08 DIAGNOSIS — M25572 Pain in left ankle and joints of left foot: Secondary | ICD-10-CM | POA: Diagnosis not present

## 2022-12-08 DIAGNOSIS — Z9012 Acquired absence of left breast and nipple: Secondary | ICD-10-CM

## 2022-12-08 DIAGNOSIS — I5030 Unspecified diastolic (congestive) heart failure: Secondary | ICD-10-CM | POA: Diagnosis present

## 2022-12-08 DIAGNOSIS — W010XXA Fall on same level from slipping, tripping and stumbling without subsequent striking against object, initial encounter: Secondary | ICD-10-CM | POA: Diagnosis not present

## 2022-12-08 DIAGNOSIS — F32A Depression, unspecified: Secondary | ICD-10-CM | POA: Diagnosis present

## 2022-12-08 DIAGNOSIS — N390 Urinary tract infection, site not specified: Secondary | ICD-10-CM | POA: Diagnosis present

## 2022-12-08 DIAGNOSIS — R000 Tachycardia, unspecified: Secondary | ICD-10-CM | POA: Diagnosis not present

## 2022-12-08 DIAGNOSIS — B965 Pseudomonas (aeruginosa) (mallei) (pseudomallei) as the cause of diseases classified elsewhere: Secondary | ICD-10-CM | POA: Diagnosis present

## 2022-12-08 DIAGNOSIS — Z471 Aftercare following joint replacement surgery: Secondary | ICD-10-CM | POA: Diagnosis not present

## 2022-12-08 DIAGNOSIS — Z853 Personal history of malignant neoplasm of breast: Secondary | ICD-10-CM

## 2022-12-08 DIAGNOSIS — F0390 Unspecified dementia without behavioral disturbance: Secondary | ICD-10-CM | POA: Diagnosis not present

## 2022-12-08 DIAGNOSIS — I10 Essential (primary) hypertension: Secondary | ICD-10-CM | POA: Diagnosis not present

## 2022-12-08 DIAGNOSIS — W19XXXA Unspecified fall, initial encounter: Secondary | ICD-10-CM | POA: Diagnosis not present

## 2022-12-08 DIAGNOSIS — Z823 Family history of stroke: Secondary | ICD-10-CM

## 2022-12-08 DIAGNOSIS — M1990 Unspecified osteoarthritis, unspecified site: Secondary | ICD-10-CM | POA: Diagnosis present

## 2022-12-08 DIAGNOSIS — Z043 Encounter for examination and observation following other accident: Secondary | ICD-10-CM | POA: Diagnosis not present

## 2022-12-08 DIAGNOSIS — Z803 Family history of malignant neoplasm of breast: Secondary | ICD-10-CM

## 2022-12-08 DIAGNOSIS — Z923 Personal history of irradiation: Secondary | ICD-10-CM

## 2022-12-08 DIAGNOSIS — I959 Hypotension, unspecified: Secondary | ICD-10-CM | POA: Diagnosis not present

## 2022-12-08 DIAGNOSIS — M25551 Pain in right hip: Secondary | ICD-10-CM | POA: Diagnosis not present

## 2022-12-08 DIAGNOSIS — I509 Heart failure, unspecified: Secondary | ICD-10-CM | POA: Diagnosis not present

## 2022-12-08 LAB — CBC WITH DIFFERENTIAL/PLATELET
Abs Immature Granulocytes: 0.12 10*3/uL — ABNORMAL HIGH (ref 0.00–0.07)
Basophils Absolute: 0 10*3/uL (ref 0.0–0.1)
Basophils Relative: 0 %
Eosinophils Absolute: 0 10*3/uL (ref 0.0–0.5)
Eosinophils Relative: 0 %
HCT: 36.5 % (ref 36.0–46.0)
Hemoglobin: 12 g/dL (ref 12.0–15.0)
Immature Granulocytes: 1 %
Lymphocytes Relative: 4 %
Lymphs Abs: 0.8 10*3/uL (ref 0.7–4.0)
MCH: 32.3 pg (ref 26.0–34.0)
MCHC: 32.9 g/dL (ref 30.0–36.0)
MCV: 98.1 fL (ref 80.0–100.0)
Monocytes Absolute: 1.1 10*3/uL — ABNORMAL HIGH (ref 0.1–1.0)
Monocytes Relative: 6 %
Neutro Abs: 15.5 10*3/uL — ABNORMAL HIGH (ref 1.7–7.7)
Neutrophils Relative %: 89 %
Platelets: 310 10*3/uL (ref 150–400)
RBC: 3.72 MIL/uL — ABNORMAL LOW (ref 3.87–5.11)
RDW: 13.8 % (ref 11.5–15.5)
WBC: 17.5 10*3/uL — ABNORMAL HIGH (ref 4.0–10.5)
nRBC: 0 % (ref 0.0–0.2)

## 2022-12-08 LAB — TROPONIN I (HIGH SENSITIVITY)
Troponin I (High Sensitivity): 17 ng/L (ref <18)
Troponin I (High Sensitivity): 43 ng/L — ABNORMAL HIGH (ref <18)

## 2022-12-08 LAB — URINALYSIS, W/ REFLEX TO CULTURE (INFECTION SUSPECTED)
Bilirubin Urine: NEGATIVE
Glucose, UA: NEGATIVE mg/dL
Ketones, ur: 20 mg/dL — AB
Leukocytes,Ua: NEGATIVE
Nitrite: POSITIVE — AB
Protein, ur: NEGATIVE mg/dL
Specific Gravity, Urine: 1.018 (ref 1.005–1.030)
pH: 5 (ref 5.0–8.0)

## 2022-12-08 LAB — BASIC METABOLIC PANEL
Anion gap: 11 (ref 5–15)
BUN: 22 mg/dL (ref 8–23)
CO2: 23 mmol/L (ref 22–32)
Calcium: 9.2 mg/dL (ref 8.9–10.3)
Chloride: 104 mmol/L (ref 98–111)
Creatinine, Ser: 0.95 mg/dL (ref 0.44–1.00)
GFR, Estimated: 59 mL/min — ABNORMAL LOW (ref >60)
Glucose, Bld: 117 mg/dL — ABNORMAL HIGH (ref 70–99)
Potassium: 3.7 mmol/L (ref 3.5–5.1)
Sodium: 138 mmol/L (ref 135–145)

## 2022-12-08 LAB — PROTIME-INR
INR: 0.9 (ref 0.8–1.2)
Prothrombin Time: 12.3 seconds (ref 11.4–15.2)

## 2022-12-08 LAB — CBG MONITORING, ED: Glucose-Capillary: 111 mg/dL — ABNORMAL HIGH (ref 70–99)

## 2022-12-08 MED ORDER — POLYETHYLENE GLYCOL 3350 17 G PO PACK: 17.0000 g | PACK | Freq: Every day | ORAL | Status: DC | PRN

## 2022-12-08 MED ORDER — OXYCODONE HCL 5 MG PO TABS
5.0000 mg | ORAL_TABLET | ORAL | Status: DC | PRN
  Administered 2022-12-08: 5 mg via ORAL
  Filled 2022-12-08: qty 1

## 2022-12-08 MED ORDER — SENNOSIDES-DOCUSATE SODIUM 8.6-50 MG PO TABS: 1.0000 | ORAL_TABLET | Freq: Two times a day (BID) | ORAL | Status: DC | PRN

## 2022-12-08 MED ORDER — ESCITALOPRAM OXALATE 10 MG PO TABS
10.0000 mg | ORAL_TABLET | Freq: Every day | ORAL | Status: DC
  Administered 2022-12-09 – 2022-12-12 (×3): 10 mg via ORAL
  Filled 2022-12-08 (×3): qty 1

## 2022-12-08 MED ORDER — ENOXAPARIN SODIUM 40 MG/0.4ML IJ SOSY
40.0000 mg | PREFILLED_SYRINGE | INTRAMUSCULAR | Status: AC
  Administered 2022-12-08: 40 mg via SUBCUTANEOUS
  Filled 2022-12-08: qty 0.4

## 2022-12-08 MED ORDER — ONDANSETRON HCL 4 MG/2ML IJ SOLN: 4.0000 mg | Freq: Four times a day (QID) | INTRAMUSCULAR | Status: DC | PRN

## 2022-12-08 MED ORDER — ONDANSETRON HCL 4 MG PO TABS: 4.0000 mg | ORAL_TABLET | Freq: Four times a day (QID) | ORAL | Status: DC | PRN

## 2022-12-08 MED ORDER — ALBUTEROL SULFATE (2.5 MG/3ML) 0.083% IN NEBU: 2.5000 mg | INHALATION_SOLUTION | RESPIRATORY_TRACT | Status: DC | PRN

## 2022-12-08 MED ORDER — ACETAMINOPHEN 325 MG PO TABS
650.0000 mg | ORAL_TABLET | Freq: Four times a day (QID) | ORAL | Status: DC | PRN
  Administered 2022-12-08 – 2022-12-09 (×2): 650 mg via ORAL
  Filled 2022-12-08 (×2): qty 2

## 2022-12-08 MED ORDER — ACETAMINOPHEN 650 MG RE SUPP: 650.0000 mg | Freq: Four times a day (QID) | RECTAL | Status: DC | PRN

## 2022-12-08 MED ORDER — OXYCODONE HCL 5 MG PO TABS
5.0000 mg | ORAL_TABLET | Freq: Once | ORAL | Status: AC
  Administered 2022-12-08: 5 mg via ORAL
  Filled 2022-12-08: qty 1

## 2022-12-08 MED ORDER — PANTOPRAZOLE SODIUM 40 MG PO TBEC
40.0000 mg | DELAYED_RELEASE_TABLET | Freq: Two times a day (BID) | ORAL | Status: DC
  Administered 2022-12-08 – 2022-12-09 (×3): 40 mg via ORAL
  Filled 2022-12-08 (×3): qty 1

## 2022-12-08 NOTE — ED Notes (Signed)
MD removed C collar

## 2022-12-08 NOTE — H&P (Signed)
History and Physical  [name] ZOX:[id number] DOB: 1935/04/06 DOA: 12/08/2022  PCP: Pcp, No   Chief Complaint: Found down, right hip pain  HPI: [name] is a 88 y.o. female with medical history significant for dementia, GERD, hypertension, hyperlipidemia, lives in assisted living and was found down on the ground by her daughter this morning, and being admitted to the hospital with traumatic closed right hip fracture.  [name] checks on her daily denies any recent illness, specifically no complaints of chest pain, shortness of breath, cough, or fever.  Upon my interview with the patient, she is awake and oriented only to herself.  Tells me and her daughter that she was trying to get in bed with her husband this morning, prior to falling.  Daughter notes that the patient's husband is deceased.  Workup in the emergency department did not reveal any acute findings, other than right hip fracture as noted below.  ER provider discussed with orthopedic surgery, who states that they will likely recommend surgical repair and will come speak to the patient and her daughter.  In the meantime, hospitalist admission was requested.  Currently the patient is resting comfortably and denies any discomfort.  Review of Systems: Please see HPI for pertinent positives and negatives. A complete 10 system review of systems are otherwise negative.  Past Medical History:  Diagnosis Date   Allergy    Anemia    Cancer of left breast (HCC) 1978   CHF (congestive heart failure) (HCC)    Chronic thoracic back pain    "T8; fracture; 03/2015; no OR" (05/05/2016)   GERD (gastroesophageal reflux disease)    Heart murmur    History of hiatal hernia    Hyperlipidemia    Hyperplastic colon polyp    Hypertension    IBS (irritable bowel syndrome)    Multiple thyroid nodules    Osteoporosis    T8 compression fx 03/2015    Personal history of radiation therapy    Presence of permanent cardiac  pacemaker    Vitamin B12 deficiency    Past Surgical History:  Procedure Laterality Date   ANTERIOR CERVICAL DECOMP/DISCECTOMY FUSION  2008   C5-6   AUGMENTATION MAMMAPLASTY     BACK SURGERY     BREAST SURGERY     CARDIAC CATHETERIZATION  2001   CORONARY PRESSURE/FFR STUDY N/A 10/07/2019   Procedure: INTRAVASCULAR PRESSURE WIRE/FFR STUDY;  Surgeon: Corky Crafts, MD;  Location: [facility] INVASIVE CV LAB;  Service: Cardiovascular;  Laterality: N/A;   EP IMPLANTABLE DEVICE N/A 05/05/2016   Procedure: Pacemaker Implant;  Surgeon: Marinus Maw, MD;  Location: [facility] INVASIVE CV LAB;  Service: Cardiovascular;  Laterality: N/A;   EXCISIONAL HEMORRHOIDECTOMY  1984   With subsequent correction of surgery   INSERT / REPLACE / REMOVE PACEMAKER     KNEE ARTHROSCOPY Left    "meniscus tear"   LEFT HEART CATH AND CORONARY ANGIOGRAPHY N/A 10/07/2019   Procedure: LEFT HEART CATH AND CORONARY ANGIOGRAPHY;  Surgeon: Corky Crafts, MD;  Location: [facility] INVASIVE CV LAB;  Service: Cardiovascular;  Laterality: N/A;   MASTECTOMY Left 1978   PLACEMENT OF BREAST IMPLANTS Left 1981   REDUCTION MAMMAPLASTY Right    SHOULDER ARTHROSCOPY W/ ROTATOR CUFF REPAIR Left 02/2006   "tear"   TUBAL LIGATION     Social History:  reports that she has never smoked. She has never used smokeless tobacco. She reports that she does not drink alcohol and does not use drugs.  Allergies  Allergen Reactions   Statins Other (See Comments)    Lipitor caused hospitalization - - depletion of electrolytes, fever, nausea, loss of appetite, couldn't get out of bed   Cymbalta [Duloxetine Hcl] Other (See Comments)    Dulled her too much, difficulty urinating, change in vision- "Allergic," per [facility]   Fosamax [Alendronate Sodium] Other (See Comments)    Caused chronic issues swallowing   Amitriptyline Other (See Comments)    Hallucinations    Fosamax [Alendronate] Other (See Comments)    "Allergic," per [facility]   Lyrica [Pregabalin]  Other (See Comments)    "Allergic," per [name]   Ranitidine Other (See Comments)    "Allergic," per [name]   Neurontin [Gabapentin] Other (See Comments)    Dizziness and sedation (patient is tolerating in lower dose)- "Allergic," per [name]   Zanaflex [Tizanidine] Other (See Comments)    Could not sleep    Family History  Problem Relation Age of Onset   Stroke Sister    Diabetes Brother    Breast cancer Maternal Aunt    Atrial fibrillation Sister    Colon cancer Neg Hx    Stomach cancer Neg Hx      Prior to Admission medications   Medication Sig Start Date End Date Taking? Authorizing Provider  acetaminophen (TYLENOL) 500 MG tablet Take 500 mg by mouth every 8 (eight) hours as needed for mild pain.   Yes [provider]  cyanocobalamin (VITAMIN B12) 1000 MCG tablet Take 1,000 mcg by mouth daily.   Yes [provider]  escitalopram (LEXAPRO) 10 MG tablet Take one tablet daily 02/25/22  Yes Tat, Lurena Joiner S, DO  LORazepam (ATIVAN) 0.5 MG tablet Take 0.5 mg by mouth 3 (three) times daily between meals as needed for anxiety.   Yes [provider]  omeprazole (PRILOSEC) 40 MG capsule TAKE 1 CAPSULE BY MOUTH TWICE A DAY FOR GERD. Patient taking differently: Take 40 mg by mouth 2 (two) times daily before a meal. 05/07/21  Yes Burns, Bobette Mo, MD  polyethylene glycol (MIRALAX / GLYCOLAX) packet Take 17 g by mouth daily as needed (constipation- MIX AND DRINK AS DIRECTED).   Yes [provider]  senna-docusate (SENOKOT-S) 8.6-50 MG tablet Take 1 tablet by mouth 2 (two) times daily between meals as needed for mild constipation. 05/19/22  Yes Almon Hercules, MD  traMADol (ULTRAM) 50 MG tablet Take 25 mg by mouth 3 (three) times daily. 12/02/22  Yes [provider]  busPIRone (BUSPAR) 10 MG tablet Take 1 tablet (10 mg total) by mouth 3 (three) times daily as needed. Patient not taking: Reported on 12/08/2022 01/18/21   Corwin Levins, MD  ezetimibe (ZETIA) 10 MG tablet  TAKE ONE TABLET BY MOUTH EVERY MORNING Patient not taking: Reported on 12/08/2022 04/04/21   Pincus Sanes, MD  Misc. Devices [facility]) MISC Power Wheelchair Patient not taking: Reported on 12/08/2022 03/28/21   Tat, Octaviano Batty, DO    Physical Exam: BP (!) 169/69 (BP Location: Right Arm)   Pulse (!) 102   Temp 97.6 F (36.4 C) (Axillary)   Resp (!) 24   Ht 5\' 1"  (1.549 m)   Wt 66.7 kg   SpO2 98%   BMI 27.78 kg/m   General:  Alert, oriented to self only, calm, in no acute distress, daughter is at the bedside Eyes: EOMI, clear conjuctivae, white sclerea Neck: supple, no masses, trachea mildline  Cardiovascular: RRR, no murmurs or rubs, no peripheral edema  Respiratory: clear  to auscultation bilaterally, no wheezes, no crackles  Abdomen: soft, nontender, nondistended, normal bowel tones heard  Skin: dry, no rashes  Musculoskeletal: no joint effusions, normal range of motion  Psychiatric: appropriate affect, normal speech  Neurologic: extraocular muscles intact, clear speech, moving all extremities with intact sensorium          Labs on Admission:  Basic Metabolic Panel: Recent Labs  Lab 12/08/22 1010  NA 138  K 3.7  CL 104  CO2 23  GLUCOSE 117*  BUN 22  CREATININE 0.95  CALCIUM 9.2   Liver Function Tests: No results for input(s): "AST", "ALT", "ALKPHOS", "BILITOT", "PROT", "ALBUMIN" in the last 168 hours. No results for input(s): "LIPASE", "AMYLASE" in the last 168 hours. No results for input(s): "AMMONIA" in the last 168 hours. CBC: Recent Labs  Lab 12/08/22 1010  WBC 17.5*  NEUTROABS 15.5*  HGB 12.0  HCT 36.5  MCV 98.1  PLT 310   Cardiac Enzymes: No results for input(s): "CKTOTAL", "CKMB", "CKMBINDEX", "TROPONINI" in the last 168 hours.  BNP (last 3 results) No results for input(s): "BNP" in the last 8760 hours.  ProBNP (last 3 results) No results for input(s): "PROBNP" in the last 8760 hours.  CBG: Recent Labs  Lab 12/08/22 0954  GLUCAP 111*     Radiological Exams on Admission: CT HEAD WO CONTRAST ( )  Result Date: 12/08/2022 CLINICAL DATA:  Head trauma, minor (Age >= 65y); Polytrauma, blunt. Found down/fall. Recent headache. EXAM: CT HEAD WITHOUT CONTRAST CT CERVICAL SPINE WITHOUT CONTRAST TECHNIQUE: Multidetector CT imaging of the head and cervical spine was performed following the standard protocol without intravenous contrast. Multiplanar CT image reconstructions of the cervical spine were also generated. RADIATION DOSE REDUCTION: This exam was performed according to the departmental dose-optimization program which includes automated exposure control, adjustment of the mA and/or kV according to patient size and/or use of iterative reconstruction technique. COMPARISON:  CT head, cervical spine, and thoracic spine 05/17/2022 FINDINGS: CT HEAD FINDINGS Brain: There is no evidence of an acute infarct, intracranial hemorrhage, mass, midline shift, or extra-axial fluid collection. Mild cerebral atrophy is within normal limits for age. Cerebral white matter hypodensities are unchanged and nonspecific but compatible with mild chronic small vessel ischemic disease. Vascular: Calcified atherosclerosis at the skull base. No hyperdense vessel. Skull: No acute fracture or suspicious osseous lesion. Sinuses/Orbits: Paranasal sinuses and mastoid air cells are clear. Bilateral cataract extraction. Other: None. CT CERVICAL SPINE FINDINGS Alignment: Chronic trace retrolisthesis of C3 on C4 and trace anterolisthesis of C7 on T1. Skull base and vertebrae: No acute fracture or suspicious osseous lesion. Mild chronic C7 inferior and T2 superior endplate compression fractures, also present on the comparison studies. Solid C5-6 ACDF. Soft tissues and spinal canal: No prevertebral fluid or swelling. No visible canal hematoma. Disc levels: Mild to moderate disc degeneration at C6-7. No evidence of high-grade stenosis. Upper chest: No mass or consolidation in the  included lung apices. Other: Mild calcified atherosclerosis at the carotid bifurcations. IMPRESSION: 1. No evidence of acute intracranial abnormality. 2. No acute cervical spine fracture. Electronically Signed   By: Sebastian Ache M.D.   On: 12/08/2022 11:08   CT CERVICAL SPINE WO CONTRAST  Result Date: 12/08/2022 CLINICAL DATA:  Head trauma, minor (Age >= 65y); Polytrauma, blunt. Found down/fall. Recent headache. EXAM: CT HEAD WITHOUT CONTRAST CT CERVICAL SPINE WITHOUT CONTRAST TECHNIQUE: Multidetector CT imaging of the head and cervical spine was performed following the standard protocol without intravenous contrast. Multiplanar CT image reconstructions of  the cervical spine were also generated. RADIATION DOSE REDUCTION: This exam was performed according to the departmental dose-optimization program which includes automated exposure control, adjustment of the mA and/or kV according to patient size and/or use of iterative reconstruction technique. COMPARISON:  CT head, cervical spine, and thoracic spine 05/17/2022 FINDINGS: CT HEAD FINDINGS Brain: There is no evidence of an acute infarct, intracranial hemorrhage, mass, midline shift, or extra-axial fluid collection. Mild cerebral atrophy is within normal limits for age. Cerebral white matter hypodensities are unchanged and nonspecific but compatible with mild chronic small vessel ischemic disease. Vascular: Calcified atherosclerosis at the skull base. No hyperdense vessel. Skull: No acute fracture or suspicious osseous lesion. Sinuses/Orbits: Paranasal sinuses and mastoid air cells are clear. Bilateral cataract extraction. Other: None. CT CERVICAL SPINE FINDINGS Alignment: Chronic trace retrolisthesis of C3 on C4 and trace anterolisthesis of C7 on T1. Skull base and vertebrae: No acute fracture or suspicious osseous lesion. Mild chronic C7 inferior and T2 superior endplate compression fractures, also present on the comparison studies. Solid C5-6 ACDF. Soft  tissues and spinal canal: No prevertebral fluid or swelling. No visible canal hematoma. Disc levels: Mild to moderate disc degeneration at C6-7. No evidence of high-grade stenosis. Upper chest: No mass or consolidation in the included lung apices. Other: Mild calcified atherosclerosis at the carotid bifurcations. IMPRESSION: 1. No evidence of acute intracranial abnormality. 2. No acute cervical spine fracture. Electronically Signed   By: Sebastian Ache M.D.   On: 12/08/2022 11:08   DG Hip Unilat W or Wo Pelvis 2-3 Views Right  Result Date: 12/08/2022 CLINICAL DATA:  Fall EXAM: DG HIP (WITH OR WITHOUT PELVIS) 2-3V RIGHT COMPARISON:  12/16/2013 FINDINGS: Acute fracture of the right femoral neck, which appears predominantly subcapital in location. There is fracture shortening and varus angulation. No evidence of fracture extension to the intertrochanteric region. Hip joint alignment is maintained without dislocation. Mild to moderate diffuse right hip joint space narrowing. IMPRESSION: Acute fracture of the right femoral neck. Electronically Signed   By: Duanne Guess D.O.   On: 12/08/2022 10:34   DG Chest Portable 1 View  Result Date: 12/08/2022 CLINICAL DATA:  Fall EXAM: PORTABLE CHEST 1 VIEW COMPARISON:  05/17/2022 FINDINGS: Right-sided implanted cardiac device remains in place. Stable heart size. Aortic and mitral annulus calcification. No focal airspace consolidation, pleural effusion, or pneumothorax. Bones are demineralized. No acute bony abnormalities are identified. IMPRESSION: No active disease. Electronically Signed   By: Duanne Guess D.O.   On: 12/08/2022 10:33    Assessment/Plan Pleasantly demented 88 year old female with history of hypertension, hyperlipidemia, GERD be admitted to the hospital with a mechanical fall overnight, after she thought she saw her deceased husband. -Inpatient admission -Pain and nausea control as needed -Await formal orthopedic surgery consultation and  recommendations -Will keep n.p.o. until surgical planning is clarified  Confusion-will check urinalysis and culture if indicated    GERD (gastroesophageal reflux disease)-Home PPI twice daily   Prediabetes   (HFpEF) heart failure with preserved ejection fraction (HCC)-continue present home medications, no evidence of acute heart failure   Stage 3b chronic kidney disease (CKD) (HCC)   DVT prophylaxis: Lovenox     Code Status: DNR, would not want intubation.  Patient has goldenrod form, discussed with daughter in detail.  Also discussed that DNR may need to be rescinded during the perioperative period.  Consults called: None  Admission status: The appropriate patient status for this patient is INPATIENT. Inpatient status is judged to be reasonable and necessary in  order to provide the required intensity of service to ensure the patient's safety. The patient's presenting symptoms, physical exam findings, and initial radiographic and laboratory data in the context of their chronic comorbidities is felt to place them at high risk for further clinical deterioration. Furthermore, it is not anticipated that the patient will be medically stable for discharge from the hospital within 2 midnights of admission.    I certify that at the point of admission it is my clinical judgment that the patient will require inpatient hospital care spanning beyond 2 midnights from the point of admission due to high intensity of service, high risk for further deterioration and high frequency of surveillance required   Time spent: 48 minutes  [name] Raus Sharlette Dense MD [facility] Pager [phone]  If 7PM-7AM, please contact night-coverage [email or web address] Password [email or web address]  12/08/2022, 1:24 PM

## 2022-12-08 NOTE — ED Provider Notes (Signed)
[place] EMERGENCY DEPARTMENT AT [facility] Provider Note   CSN: [id number] Arrival date & time: 12/08/22  1914     History Chief Complaint  Patient presents with   Fall    Found on floor this morning at facility    HPI [name] is a 88 y.o. female presenting for chief complaint ground-level fall.  She is found down at her facility this morning.  Obvious right hip deformity.  Patient is confused requiring repeated prompting for questions which is different from her baseline per daughter who is at bedside.  Has been having a lot of anxiety and pain at the facility who has had increased doses of Ativan and tramadol administered.  Also has Parkinson's disease.   Patient's recorded medical, surgical, social, medication list and allergies were reviewed in the Snapshot window as part of the initial history.   Review of Systems   Review of Systems  Unable to perform ROS: Dementia    Physical Exam Updated Vital Signs BP (!) 173/71 (BP Location: Right Arm)   Pulse 98   Temp 98.6 F (37 C)   Resp 19   Ht 5\' 1"  (1.549 m)   Wt 66.7 kg   SpO2 95%   BMI 27.78 kg/m  Physical Exam Vitals and nursing note reviewed.  Constitutional:      General: She is not in acute distress.    Appearance: She is well-developed.  HENT:     Head: Normocephalic and atraumatic.  Eyes:     Conjunctiva/sclera: Conjunctivae normal.  Cardiovascular:     Rate and Rhythm: Normal rate and regular rhythm.     Heart sounds: No murmur heard. Pulmonary:     Effort: Pulmonary effort is normal. No respiratory distress.     Breath sounds: Normal breath sounds.  Abdominal:     General: There is no distension.     Palpations: Abdomen is soft.     Tenderness: There is no abdominal tenderness. There is no right CVA tenderness or left CVA tenderness.  Musculoskeletal:        General: Tenderness (Deformity present right hip but only mild tenderness palpation all of the joints palpated without  tenderness to palpation.) present. No swelling. Normal range of motion.     Cervical back: Neck supple.  Skin:    General: Skin is warm and dry.  Neurological:     General: No focal deficit present.     Mental Status: She is alert and oriented to person, place, and time. Mental status is at baseline.     Cranial Nerves: No cranial nerve deficit.      ED Course/ Medical Decision Making/ A&P    Procedures Procedures   Medications Ordered in ED Medications  pantoprazole (PROTONIX) EC tablet 40 mg (has no administration in time range)  escitalopram (LEXAPRO) tablet 10 mg (has no administration in time range)  polyethylene glycol (MIRALAX / GLYCOLAX) packet 17 g (has no administration in time range)  senna-docusate (Senokot-S) tablet 1 tablet (has no administration in time range)  enoxaparin (LOVENOX) injection 40 mg (has no administration in time range)  acetaminophen (TYLENOL) tablet 650 mg (has no administration in time range)    Or  acetaminophen (TYLENOL) suppository 650 mg (has no administration in time range)  oxyCODONE (Oxy IR/ROXICODONE) immediate release tablet 5 mg (has no administration in time range)  ondansetron (ZOFRAN) tablet 4 mg (has no administration in time range)    Or  ondansetron (ZOFRAN) injection 4 mg (has  no administration in time range)  albuterol (PROVENTIL) (2.5 MG/3ML) 0.083% nebulizer solution 2.5 mg (has no administration in time range)  oxyCODONE (Oxy IR/ROXICODONE) immediate release tablet 5 mg (5 mg Oral Given 12/08/22 1136)   Medical Decision Making:   [name] is a 88 y.o. female who presented to the ED today with a fall at their living facility detailed above. They are not on a blood thinner. Patient's presentation is complicated by their history of multiple comorbid medical problems.  Patient placed on continuous vitals and telemetry monitoring while in ED which was reviewed periodically.   Complete initial physical exam performed,  notably the patient  was hemodynamically stable  in no acute distress. No obvious deformities or injuries appreciated on extensive physical exam including active range of motion of all joints.     Reviewed and confirmed nursing documentation for past medical history, family history, social history.    Initial Assessment/Plan:   This is a patient presenting with a moderate blunt mechanism trauma.  As such, I have considered intracranial injuries including intracranial hemorrhage, intrathoracic injuries including blunt myocardial or blunt lung injury, blunt abdominal injuries including aortic dissection, bladder injury, spleen injury, liver injury and I have considered orthopedic injuries including extremity or spinal injury. This is most consistent with an acute life/limb threatening illness complicated by underlying chronic conditions.  With the patient's presentation of moderate mechanism trauma but an otherwise reassuring exam, patient warrants targeted evaluation for potential traumatic injuries. Will proceed with targeted evaluation for potential injuries. Will proceed with CT Head, Cervical Spine CT, and Chest/Pelvis XR.    Images reviewed and agree with radiology interpretation.  CT HEAD WO CONTRAST ( )  Result Date: 12/08/2022 CLINICAL DATA:  Head trauma, minor (Age >= 65y); Polytrauma, blunt. Found down/fall. Recent headache. EXAM: CT HEAD WITHOUT CONTRAST CT CERVICAL SPINE WITHOUT CONTRAST TECHNIQUE: Multidetector CT imaging of the head and cervical spine was performed following the standard protocol without intravenous contrast. Multiplanar CT image reconstructions of the cervical spine were also generated. RADIATION DOSE REDUCTION: This exam was performed according to the departmental dose-optimization program which includes automated exposure control, adjustment of the mA and/or kV according to patient size and/or use of iterative reconstruction technique. COMPARISON:  CT head, cervical  spine, and thoracic spine 05/17/2022 FINDINGS: CT HEAD FINDINGS Brain: There is no evidence of an acute infarct, intracranial hemorrhage, mass, midline shift, or extra-axial fluid collection. Mild cerebral atrophy is within normal limits for age. Cerebral white matter hypodensities are unchanged and nonspecific but compatible with mild chronic small vessel ischemic disease. Vascular: Calcified atherosclerosis at the skull base. No hyperdense vessel. Skull: No acute fracture or suspicious osseous lesion. Sinuses/Orbits: Paranasal sinuses and mastoid air cells are clear. Bilateral cataract extraction. Other: None. CT CERVICAL SPINE FINDINGS Alignment: Chronic trace retrolisthesis of C3 on C4 and trace anterolisthesis of C7 on T1. Skull base and vertebrae: No acute fracture or suspicious osseous lesion. Mild chronic C7 inferior and T2 superior endplate compression fractures, also present on the comparison studies. Solid C5-6 ACDF. Soft tissues and spinal canal: No prevertebral fluid or swelling. No visible canal hematoma. Disc levels: Mild to moderate disc degeneration at C6-7. No evidence of high-grade stenosis. Upper chest: No mass or consolidation in the included lung apices. Other: Mild calcified atherosclerosis at the carotid bifurcations. IMPRESSION: 1. No evidence of acute intracranial abnormality. 2. No acute cervical spine fracture. Electronically Signed   By: Sebastian Ache M.D.   On: 12/08/2022 11:08   CT  CERVICAL SPINE WO CONTRAST  Result Date: 12/08/2022 CLINICAL DATA:  Head trauma, minor (Age >= 65y); Polytrauma, blunt. Found down/fall. Recent headache. EXAM: CT HEAD WITHOUT CONTRAST CT CERVICAL SPINE WITHOUT CONTRAST TECHNIQUE: Multidetector CT imaging of the head and cervical spine was performed following the standard protocol without intravenous contrast. Multiplanar CT image reconstructions of the cervical spine were also generated. RADIATION DOSE REDUCTION: This exam was performed according to the  departmental dose-optimization program which includes automated exposure control, adjustment of the mA and/or kV according to patient size and/or use of iterative reconstruction technique. COMPARISON:  CT head, cervical spine, and thoracic spine 05/17/2022 FINDINGS: CT HEAD FINDINGS Brain: There is no evidence of an acute infarct, intracranial hemorrhage, mass, midline shift, or extra-axial fluid collection. Mild cerebral atrophy is within normal limits for age. Cerebral white matter hypodensities are unchanged and nonspecific but compatible with mild chronic small vessel ischemic disease. Vascular: Calcified atherosclerosis at the skull base. No hyperdense vessel. Skull: No acute fracture or suspicious osseous lesion. Sinuses/Orbits: Paranasal sinuses and mastoid air cells are clear. Bilateral cataract extraction. Other: None. CT CERVICAL SPINE FINDINGS Alignment: Chronic trace retrolisthesis of C3 on C4 and trace anterolisthesis of C7 on T1. Skull base and vertebrae: No acute fracture or suspicious osseous lesion. Mild chronic C7 inferior and T2 superior endplate compression fractures, also present on the comparison studies. Solid C5-6 ACDF. Soft tissues and spinal canal: No prevertebral fluid or swelling. No visible canal hematoma. Disc levels: Mild to moderate disc degeneration at C6-7. No evidence of high-grade stenosis. Upper chest: No mass or consolidation in the included lung apices. Other: Mild calcified atherosclerosis at the carotid bifurcations. IMPRESSION: 1. No evidence of acute intracranial abnormality. 2. No acute cervical spine fracture. Electronically Signed   By: Sebastian Ache M.D.   On: 12/08/2022 11:08   DG Hip Unilat W or Wo Pelvis 2-3 Views Right  Result Date: 12/08/2022 CLINICAL DATA:  Fall EXAM: DG HIP (WITH OR WITHOUT PELVIS) 2-3V RIGHT COMPARISON:  12/16/2013 FINDINGS: Acute fracture of the right femoral neck, which appears predominantly subcapital in location. There is fracture  shortening and varus angulation. No evidence of fracture extension to the intertrochanteric region. Hip joint alignment is maintained without dislocation. Mild to moderate diffuse right hip joint space narrowing. IMPRESSION: Acute fracture of the right femoral neck. Electronically Signed   By: Duanne Guess D.O.   On: 12/08/2022 10:34   DG Chest Portable 1 View  Result Date: 12/08/2022 CLINICAL DATA:  Fall EXAM: PORTABLE CHEST 1 VIEW COMPARISON:  05/17/2022 FINDINGS: Right-sided implanted cardiac device remains in place. Stable heart size. Aortic and mitral annulus calcification. No focal airspace consolidation, pleural effusion, or pneumothorax. Bones are demineralized. No acute bony abnormalities are identified. IMPRESSION: No active disease. Electronically Signed   By: Duanne Guess D.O.   On: 12/08/2022 10:33    Final Reassessment and Plan:   Patient with a right hip fracture. Consulted orthopedic surgery for further definitive care management.  They recommend operative intervention and medical admission in the interim.  Disposition:   Based on the above findings, I believe this patient is stable for admission.    Patient/family educated about specific findings on our evaluation and explained exact reasons for admission.  Patient/family educated about clinical situation and time was allowed to answer questions.   Admission team communicated with and agreed with need for admission. Patient admitted. Patient  ready to move at this time.     Emergency Department Medication Summary:  Medications  pantoprazole (PROTONIX) EC tablet 40 mg (has no administration in time range)  escitalopram (LEXAPRO) tablet 10 mg (has no administration in time range)  polyethylene glycol (MIRALAX / GLYCOLAX) packet 17 g (has no administration in time range)  senna-docusate (Senokot-S) tablet 1 tablet (has no administration in time range)  enoxaparin (LOVENOX) injection 40 mg (has no administration in time  range)  acetaminophen (TYLENOL) tablet 650 mg (has no administration in time range)    Or  acetaminophen (TYLENOL) suppository 650 mg (has no administration in time range)  oxyCODONE (Oxy IR/ROXICODONE) immediate release tablet 5 mg (has no administration in time range)  ondansetron (ZOFRAN) tablet 4 mg (has no administration in time range)    Or  ondansetron (ZOFRAN) injection 4 mg (has no administration in time range)  albuterol (PROVENTIL) (2.5 MG/3ML) 0.083% nebulizer solution 2.5 mg (has no administration in time range)  oxyCODONE (Oxy IR/ROXICODONE) immediate release tablet 5 mg (5 mg Oral Given 12/08/22 1136)            Clinical Impression:  1. Fall, initial encounter      Admit   Final Clinical Impression(s) / ED Diagnoses Final diagnoses:  Fall, initial encounter    Rx / DC Orders ED Discharge Orders     None         Glyn Ade, MD 12/08/22 [phone]

## 2022-12-08 NOTE — H&P (View-Only) (Signed)
Reason for Consult:right hip pain Referring Physician: Dr. Ikramullah  [name] is an 87 y.o. female.  HPI: 87-year-old female with a past medical history significant for Parkinson's disease and dementia was found on the floor beside her bed at living this morning.  Her daughter reports that the patient said she fell getting into bed.  She complains of pain in the right hip.  She was unable to bear weight.  She uses a wheelchair primarily at baseline but is able to stand for transfers.  Her daughter and daughter-in-law are both at the bedside this evening.  She does not take any blood thinners.  Past Medical History:  Diagnosis Date   Allergy    Anemia    Cancer of left breast (HCC) 1978   CHF (congestive heart failure) (HCC)    Chronic thoracic back pain    "T8; fracture; 03/2015; no OR" (05/05/2016)   GERD (gastroesophageal reflux disease)    Heart murmur    History of hiatal hernia    Hyperlipidemia    Hyperplastic colon polyp    Hypertension    IBS (irritable bowel syndrome)    Multiple thyroid nodules    Osteoporosis    T8 compression fx 03/2015    Personal history of radiation therapy    Presence of permanent cardiac pacemaker    Vitamin B12 deficiency     Past Surgical History:  Procedure Laterality Date   ANTERIOR CERVICAL DECOMP/DISCECTOMY FUSION  2008   C5-6   AUGMENTATION MAMMAPLASTY     BACK SURGERY     BREAST SURGERY     CARDIAC CATHETERIZATION  2001   CORONARY PRESSURE/FFR STUDY N/A 10/07/2019   Procedure: INTRAVASCULAR PRESSURE WIRE/FFR STUDY;  Surgeon: Varanasi, Jayadeep S, MD;  Location: [facility];  Service: Cardiovascular;  Laterality: N/A;   EP IMPLANTABLE DEVICE N/A 05/05/2016   Procedure: Pacemaker Implant;  Surgeon: Gregg W Taylor, MD;  Location: [facility];  Service: Cardiovascular;  Laterality: N/A;   EXCISIONAL HEMORRHOIDECTOMY  1984   With subsequent correction of surgery   INSERT / REPLACE / REMOVE PACEMAKER     KNEE  ARTHROSCOPY Left    "meniscus tear"   LEFT HEART CATH AND CORONARY ANGIOGRAPHY N/A 10/07/2019   Procedure: LEFT HEART CATH AND CORONARY ANGIOGRAPHY;  Surgeon: Varanasi, Jayadeep S, MD;  Location: [facility];  Service: Cardiovascular;  Laterality: N/A;   MASTECTOMY Left 1978   PLACEMENT OF BREAST IMPLANTS Left 1981   REDUCTION MAMMAPLASTY Right    SHOULDER ARTHROSCOPY W/ ROTATOR CUFF REPAIR Left 02/2006   "tear"   TUBAL LIGATION      Family History  Problem Relation Age of Onset   Stroke Sister    Diabetes Brother    Breast cancer Maternal Aunt    Atrial fibrillation Sister    Colon cancer Neg Hx    Stomach cancer Neg Hx     Social History:  reports that she has never smoked. She has never used smokeless tobacco. She reports that she does not drink alcohol and does not use drugs.  Allergies:  Allergies  Allergen Reactions   Statins Other (See Comments)    Lipitor caused hospitalization - - depletion of electrolytes, fever, nausea, loss of appetite, couldn't get out of bed   Cymbalta [Duloxetine Hcl] Other (See Comments)    Dulled her too much, difficulty urinating, change in vision- "Allergic," per [name]   Fosamax [Alendronate Sodium] Other (See Comments)    Caused chronic issues   swallowing   Amitriptyline Other (See Comments)    Hallucinations    Fosamax [Alendronate] Other (See Comments)    "Allergic," per [name]   Lyrica [Pregabalin] Other (See Comments)    "Allergic," per [name]   Ranitidine Other (See Comments)    "Allergic," per [name]   Neurontin [Gabapentin] Other (See Comments)    Dizziness and sedation (patient is tolerating in lower dose)- "Allergic," per [name]   Zanaflex [Tizanidine] Other (See Comments)    Could not sleep    Medications: I have reviewed the patient's current medications.  Results for orders placed or performed during the hospital encounter of 12/08/22 (from the past 48 hour(s))  CBG monitoring, ED     Status: Abnormal   Collection Time:  12/08/22  9:54 AM  Result Value Ref Range   Glucose-Capillary 111 (H) 70 - 99 mg/dL    Comment: Glucose reference range applies only to samples taken after fasting for at least 8 hours.  CBC with Differential     Status: Abnormal   Collection Time: 12/08/22 10:10 AM  Result Value Ref Range   WBC 17.5 (H) 4.0 - 10.5 K/uL   RBC 3.72 (L) 3.87 - 5.11 MIL/uL   Hemoglobin 12.0 12.0 - 15.0 g/dL   HCT 36.5 36.0 - 46.0 %   MCV 98.1 80.0 - 100.0 fL   MCH 32.3 26.0 - 34.0 pg   MCHC 32.9 30.0 - 36.0 g/dL   RDW 13.8 11.5 - 15.5 %   Platelets 310 150 - 400 K/uL   nRBC 0.0 0.0 - 0.2 %   Neutrophils Relative % 89 %   Neutro Abs 15.5 (H) 1.7 - 7.7 K/uL   Lymphocytes Relative 4 %   Lymphs Abs 0.8 0.7 - 4.0 K/uL   Monocytes Relative 6 %   Monocytes Absolute 1.1 (H) 0.1 - 1.0 K/uL   Eosinophils Relative 0 %   Eosinophils Absolute 0.0 0.0 - 0.5 K/uL   Basophils Relative 0 %   Basophils Absolute 0.0 0.0 - 0.1 K/uL   Immature Granulocytes 1 %   Abs Immature Granulocytes 0.12 (H) 0.00 - 0.07 K/uL    Comment: Performed at [facility], [address] [place], [place] [address]  Basic metabolic panel     Status: Abnormal   Collection Time: 12/08/22 10:10 AM  Result Value Ref Range   Sodium 138 135 - 145 mmol/L   Potassium 3.7 3.5 - 5.1 mmol/L   Chloride 104 98 - 111 mmol/L   CO2 23 22 - 32 mmol/L   Glucose, Bld 117 (H) 70 - 99 mg/dL    Comment: Glucose reference range applies only to samples taken after fasting for at least 8 hours.   BUN 22 8 - 23 mg/dL   Creatinine, Ser 0.95 0.44 - 1.00 mg/dL   Calcium 9.2 8.9 - 10.3 mg/dL   GFR, Estimated 59 (L) >60 mL/min    Comment: (NOTE) Calculated using the CKD-EPI Creatinine Equation (2021)    Anion gap 11 5 - 15    Comment: Performed at [facility], [address] [place], [place] [address]  Protime-INR     Status: None   Collection Time: 12/08/22 10:10 AM  Result Value Ref Range   Prothrombin Time 12.3 11.4  - 15.2 seconds   INR 0.9 0.8 - 1.2    Comment: (NOTE) INR goal varies based on device and disease states. Performed at [facility], [address] [place], [place] [address]     Troponin I (High Sensitivity)     Status: None   Collection Time: 12/08/22 10:10 AM  Result Value Ref Range   Troponin I (High Sensitivity) 17 <18 ng/L    Comment: (NOTE) Elevated high sensitivity troponin I (hsTnI) values and significant  changes across serial measurements may suggest ACS but many other  chronic and acute conditions are known to elevate hsTnI results.  Refer to the "Links" section for chest pain algorithms and additional  guidance. Performed at [facility], [address] [place], [place] [address]   Troponin I (High Sensitivity)     Status: Abnormal   Collection Time: 12/08/22  2:28 PM  Result Value Ref Range   Troponin I (High Sensitivity) 43 (H) <18 ng/L    Comment: DELTA CHECK NOTED (NOTE) Elevated high sensitivity troponin I (hsTnI) values and significant  changes across serial measurements may suggest ACS but many other  chronic and acute conditions are known to elevate hsTnI results.  Refer to the "Links" section for chest pain algorithms and additional  guidance. Performed at [facility], [address] , [place] [address]     CT HEAD WO CONTRAST (5MM)  Result Date: 12/08/2022 CLINICAL DATA:  Head trauma, minor (Age >= 65y); Polytrauma, blunt. Found down/fall. Recent headache. EXAM: CT HEAD WITHOUT CONTRAST CT CERVICAL SPINE WITHOUT CONTRAST TECHNIQUE: Multidetector CT imaging of the head and cervical spine was performed following the standard protocol without intravenous contrast. Multiplanar CT image reconstructions of the cervical spine were also generated. RADIATION DOSE REDUCTION: This exam was performed according to the departmental dose-optimization program which includes automated exposure control,  adjustment of the mA and/or kV according to patient size and/or use of iterative reconstruction technique. COMPARISON:  CT head, cervical spine, and thoracic spine 05/17/2022 FINDINGS: CT HEAD FINDINGS Brain: There is no evidence of an acute infarct, intracranial hemorrhage, mass, midline shift, or extra-axial fluid collection. Mild cerebral atrophy is within normal limits for age. Cerebral white matter hypodensities are unchanged and nonspecific but compatible with mild chronic small vessel ischemic disease. Vascular: Calcified atherosclerosis at the skull base. No hyperdense vessel. Skull: No acute fracture or suspicious osseous lesion. Sinuses/Orbits: Paranasal sinuses and mastoid air cells are clear. Bilateral cataract extraction. Other: None. CT CERVICAL SPINE FINDINGS Alignment: Chronic trace retrolisthesis of C3 on C4 and trace anterolisthesis of C7 on T1. Skull base and vertebrae: No acute fracture or suspicious osseous lesion. Mild chronic C7 inferior and T2 superior endplate compression fractures, also present on the comparison studies. Solid C5-6 ACDF. Soft tissues and spinal canal: No prevertebral fluid or swelling. No visible canal hematoma. Disc levels: Mild to moderate disc degeneration at C6-7. No evidence of high-grade stenosis. Upper chest: No mass or consolidation in the included lung apices. Other: Mild calcified atherosclerosis at the carotid bifurcations. IMPRESSION: 1. No evidence of acute intracranial abnormality. 2. No acute cervical spine fracture. Electronically Signed   By: Allen  Grady M.D.   On: 12/08/2022 11:08   CT CERVICAL SPINE WO CONTRAST  Result Date: 12/08/2022 CLINICAL DATA:  Head trauma, minor (Age >= 65y); Polytrauma, blunt. Found down/fall. Recent headache. EXAM: CT HEAD WITHOUT CONTRAST CT CERVICAL SPINE WITHOUT CONTRAST TECHNIQUE: Multidetector CT imaging of the head and cervical spine was performed following the standard protocol without intravenous contrast.  Multiplanar CT image reconstructions of the cervical spine were also generated. RADIATION DOSE REDUCTION: This exam was performed according to the departmental dose-optimization program which includes automated exposure control, adjustment of the   mA and/or kV according to patient size and/or use of iterative reconstruction technique. COMPARISON:  CT head, cervical spine, and thoracic spine 05/17/2022 FINDINGS: CT HEAD FINDINGS Brain: There is no evidence of an acute infarct, intracranial hemorrhage, mass, midline shift, or extra-axial fluid collection. Mild cerebral atrophy is within normal limits for age. Cerebral white matter hypodensities are unchanged and nonspecific but compatible with mild chronic small vessel ischemic disease. Vascular: Calcified atherosclerosis at the skull base. No hyperdense vessel. Skull: No acute fracture or suspicious osseous lesion. Sinuses/Orbits: Paranasal sinuses and mastoid air cells are clear. Bilateral cataract extraction. Other: None. CT CERVICAL SPINE FINDINGS Alignment: Chronic trace retrolisthesis of C3 on C4 and trace anterolisthesis of C7 on T1. Skull base and vertebrae: No acute fracture or suspicious osseous lesion. Mild chronic C7 inferior and T2 superior endplate compression fractures, also present on the comparison studies. Solid C5-6 ACDF. Soft tissues and spinal canal: No prevertebral fluid or swelling. No visible canal hematoma. Disc levels: Mild to moderate disc degeneration at C6-7. No evidence of high-grade stenosis. Upper chest: No mass or consolidation in the included lung apices. Other: Mild calcified atherosclerosis at the carotid bifurcations. IMPRESSION: 1. No evidence of acute intracranial abnormality. 2. No acute cervical spine fracture. Electronically Signed   By: Allen  Grady M.D.   On: 12/08/2022 11:08   DG Hip Unilat W or Wo Pelvis 2-3 Views Right  Result Date: 12/08/2022 CLINICAL DATA:  Fall EXAM: DG HIP (WITH OR WITHOUT PELVIS) 2-3V RIGHT  COMPARISON:  12/16/2013 FINDINGS: Acute fracture of the right femoral neck, which appears predominantly subcapital in location. There is fracture shortening and varus angulation. No evidence of fracture extension to the intertrochanteric region. Hip joint alignment is maintained without dislocation. Mild to moderate diffuse right hip joint space narrowing. IMPRESSION: Acute fracture of the right femoral neck. Electronically Signed   By: Nicholas  Plundo D.O.   On: 12/08/2022 10:34   DG Chest Portable 1 View  Result Date: 12/08/2022 CLINICAL DATA:  Fall EXAM: PORTABLE CHEST 1 VIEW COMPARISON:  05/17/2022 FINDINGS: Right-sided implanted cardiac device remains in place. Stable heart size. Aortic and mitral annulus calcification. No focal airspace consolidation, pleural effusion, or pneumothorax. Bones are demineralized. No acute bony abnormalities are identified. IMPRESSION: No active disease. Electronically Signed   By: Nicholas  Plundo D.O.   On: 12/08/2022 10:33    ROS: No recent fever, chills, nausea, vomiting or changes in her appetite PE:  Blood pressure (!) 173/71, pulse 98, temperature 98.6 F (37 C), resp. rate 19, height 5' 1" (1.549 m), weight 66.7 kg, SpO2 95 %. Thin chronically ill-appearing elderly woman in no apparent distress.  She has a significant tremor of the right upper extremity.  She is alert and oriented to person and place.  She is hard of hearing.  Extraocular motions are intact.  Respirations are unlabored.  The right lower extremity is slightly shortened and internally rotated.  2+ dorsalis pedis pulse at the foot.  Active plantarflexion and dorsiflexion strength of the toes.  Intact sensibility to light touch dorsally and plantarly at the foot.  Assessment/Plan: Right displaced femoral neck fracture -today we talked about treatment options in detail.  We discussed the risks and benefits of surgical treatment versus palliative care with closed management of this displaced  femoral neck fracture.  The patient and her family members understand the risks and benefits of the alternative treatment options.  They will talk over these options and decide how they want to proceed.  She   is already under care of hospice.  A hospice nurse has already been by to check on her.  Rina Adney 12/08/2022, 5:20 PM       

## 2022-12-08 NOTE — Consult Note (Signed)
Reason for Consult:right hip pain Referring Physician: Dr. Elie Confer [name] is an 88 y.o. female.  HPI: 88 year old female with a past medical history significant for Parkinson's disease and dementia was found on the floor beside her bed at living this morning.  Her daughter reports that the patient said she fell getting into bed.  She complains of pain in the right hip.  She was unable to bear weight.  She uses a wheelchair primarily at baseline but is able to stand for transfers.  Her daughter and daughter-in-law are both at the bedside this evening.  She does not take any blood thinners.  Past Medical History:  Diagnosis Date   Allergy    Anemia    Cancer of left breast (HCC) 1978   CHF (congestive heart failure) (HCC)    Chronic thoracic back pain    "T8; fracture; 03/2015; no OR" (05/05/2016)   GERD (gastroesophageal reflux disease)    Heart murmur    History of hiatal hernia    Hyperlipidemia    Hyperplastic colon polyp    Hypertension    IBS (irritable bowel syndrome)    Multiple thyroid nodules    Osteoporosis    T8 compression fx 03/2015    Personal history of radiation therapy    Presence of permanent cardiac pacemaker    Vitamin B12 deficiency     Past Surgical History:  Procedure Laterality Date   ANTERIOR CERVICAL DECOMP/DISCECTOMY FUSION  2008   C5-6   AUGMENTATION MAMMAPLASTY     BACK SURGERY     BREAST SURGERY     CARDIAC CATHETERIZATION  2001   CORONARY PRESSURE/FFR STUDY N/A 10/07/2019   Procedure: INTRAVASCULAR PRESSURE WIRE/FFR STUDY;  Surgeon: Corky Crafts, MD;  Location: [facility] INVASIVE CV LAB;  Service: Cardiovascular;  Laterality: N/A;   EP IMPLANTABLE DEVICE N/A 05/05/2016   Procedure: Pacemaker Implant;  Surgeon: Marinus Maw, MD;  Location: [facility] INVASIVE CV LAB;  Service: Cardiovascular;  Laterality: N/A;   EXCISIONAL HEMORRHOIDECTOMY  1984   With subsequent correction of surgery   INSERT / REPLACE / REMOVE PACEMAKER     KNEE  ARTHROSCOPY Left    "meniscus tear"   LEFT HEART CATH AND CORONARY ANGIOGRAPHY N/A 10/07/2019   Procedure: LEFT HEART CATH AND CORONARY ANGIOGRAPHY;  Surgeon: Corky Crafts, MD;  Location: [facility] INVASIVE CV LAB;  Service: Cardiovascular;  Laterality: N/A;   MASTECTOMY Left 1978   PLACEMENT OF BREAST IMPLANTS Left 1981   REDUCTION MAMMAPLASTY Right    SHOULDER ARTHROSCOPY W/ ROTATOR CUFF REPAIR Left 02/2006   "tear"   TUBAL LIGATION      Family History  Problem Relation Age of Onset   Stroke Sister    Diabetes Brother    Breast cancer Maternal Aunt    Atrial fibrillation Sister    Colon cancer Neg Hx    Stomach cancer Neg Hx     Social History:  reports that she has never smoked. She has never used smokeless tobacco. She reports that she does not drink alcohol and does not use drugs.  Allergies:  Allergies  Allergen Reactions   Statins Other (See Comments)    Lipitor caused hospitalization - - depletion of electrolytes, fever, nausea, loss of appetite, couldn't get out of bed   Cymbalta [Duloxetine Hcl] Other (See Comments)    Dulled her too much, difficulty urinating, change in vision- "Allergic," per [facility]   Fosamax [Alendronate Sodium] Other (See Comments)    Caused chronic issues  swallowing   Amitriptyline Other (See Comments)    Hallucinations    Fosamax [Alendronate] Other (See Comments)    "Allergic," per [facility]   Lyrica [Pregabalin] Other (See Comments)    "Allergic," per [name]   Ranitidine Other (See Comments)    "Allergic," per [name]   Neurontin [Gabapentin] Other (See Comments)    Dizziness and sedation (patient is tolerating in lower dose)- "Allergic," per [name]   Zanaflex [Tizanidine] Other (See Comments)    Could not sleep    Medications: I have reviewed the patient's current medications.  Results for orders placed or performed during the hospital encounter of 12/08/22 (from the past 48 hour(s))  CBG monitoring, ED     Status: Abnormal   Collection Time:  12/08/22  9:54 AM  Result Value Ref Range   Glucose-Capillary 111 (H) 70 - 99 mg/dL    Comment: Glucose reference range applies only to samples taken after fasting for at least 8 hours.  CBC with Differential     Status: Abnormal   Collection Time: 12/08/22 10:10 AM  Result Value Ref Range   WBC 17.5 (H) 4.0 - 10.5 K/uL   RBC 3.72 (L) 3.87 - 5.11 MIL/uL   Hemoglobin 12.0 12.0 - 15.0 g/dL   HCT 16.1 09.6 - 04.5 %   MCV 98.1 80.0 - 100.0 fL   MCH 32.3 26.0 - 34.0 pg   MCHC 32.9 30.0 - 36.0 g/dL   RDW 40.9 81.1 - 91.4 %   Platelets 310 150 - 400 K/uL   nRBC 0.0 0.0 - 0.2 %   Neutrophils Relative % 89 %   Neutro Abs 15.5 (H) 1.7 - 7.7 K/uL   Lymphocytes Relative 4 %   Lymphs Abs 0.8 0.7 - 4.0 K/uL   Monocytes Relative 6 %   Monocytes Absolute 1.1 (H) 0.1 - 1.0 K/uL   Eosinophils Relative 0 %   Eosinophils Absolute 0.0 0.0 - 0.5 K/uL   Basophils Relative 0 %   Basophils Absolute 0.0 0.0 - 0.1 K/uL   Immature Granulocytes 1 %   Abs Immature Granulocytes 0.12 (H) 0.00 - 0.07 K/uL    Comment: Performed at [facility], [address] [place], [place] [address]  Basic metabolic panel     Status: Abnormal   Collection Time: 12/08/22 10:10 AM  Result Value Ref Range   Sodium 138 135 - 145 mmol/L   Potassium 3.7 3.5 - 5.1 mmol/L   Chloride 104 98 - 111 mmol/L   CO2 23 22 - 32 mmol/L   Glucose, Bld 117 (H) 70 - 99 mg/dL    Comment: Glucose reference range applies only to samples taken after fasting for at least 8 hours.   BUN 22 8 - 23 mg/dL   Creatinine, Ser 6.21 0.44 - 1.00 mg/dL   Calcium 9.2 8.9 - 30.8 mg/dL   GFR, Estimated 59 (L) >60 mL/min    Comment: (NOTE) Calculated using the CKD-EPI Creatinine Equation (2021)    Anion gap 11 5 - 15    Comment: Performed at [facility], [address] [place], [place] [address]  Protime-INR     Status: None   Collection Time: 12/08/22 10:10 AM  Result Value Ref Range   Prothrombin Time 12.3 11.4  - 15.2 seconds   INR 0.9 0.8 - 1.2    Comment: (NOTE) INR goal varies based on device and disease states. Performed at [facility], [address] [place], [place] [address]  Troponin I (High Sensitivity)     Status: None   Collection Time: 12/08/22 10:10 AM  Result Value Ref Range   Troponin I (High Sensitivity) 17 <18 ng/L    Comment: (NOTE) Elevated high sensitivity troponin I (hsTnI) values and significant  changes across serial measurements may suggest ACS but many other  chronic and acute conditions are known to elevate hsTnI results.  Refer to the "Links" section for chest pain algorithms and additional  guidance. Performed at [facility], [address], [place], [place] [address]   Troponin I (High Sensitivity)     Status: Abnormal   Collection Time: 12/08/22  2:28 PM  Result Value Ref Range   Troponin I (High Sensitivity) 43 (H) <18 ng/L    Comment: DELTA CHECK NOTED (NOTE) Elevated high sensitivity troponin I (hsTnI) values and significant  changes across serial measurements may suggest ACS but many other  chronic and acute conditions are known to elevate hsTnI results.  Refer to the "Links" section for chest pain algorithms and additional  guidance. Performed at [facility], [address], [place], [place] [address]     CT HEAD WO CONTRAST ( )  Result Date: 12/08/2022 CLINICAL DATA:  Head trauma, minor (Age >= 65y); Polytrauma, blunt. Found down/fall. Recent headache. EXAM: CT HEAD WITHOUT CONTRAST CT CERVICAL SPINE WITHOUT CONTRAST TECHNIQUE: Multidetector CT imaging of the head and cervical spine was performed following the standard protocol without intravenous contrast. Multiplanar CT image reconstructions of the cervical spine were also generated. RADIATION DOSE REDUCTION: This exam was performed according to the departmental dose-optimization program which includes automated exposure control,  adjustment of the mA and/or kV according to patient size and/or use of iterative reconstruction technique. COMPARISON:  CT head, cervical spine, and thoracic spine 05/17/2022 FINDINGS: CT HEAD FINDINGS Brain: There is no evidence of an acute infarct, intracranial hemorrhage, mass, midline shift, or extra-axial fluid collection. Mild cerebral atrophy is within normal limits for age. Cerebral white matter hypodensities are unchanged and nonspecific but compatible with mild chronic small vessel ischemic disease. Vascular: Calcified atherosclerosis at the skull base. No hyperdense vessel. Skull: No acute fracture or suspicious osseous lesion. Sinuses/Orbits: Paranasal sinuses and mastoid air cells are clear. Bilateral cataract extraction. Other: None. CT CERVICAL SPINE FINDINGS Alignment: Chronic trace retrolisthesis of C3 on C4 and trace anterolisthesis of C7 on T1. Skull base and vertebrae: No acute fracture or suspicious osseous lesion. Mild chronic C7 inferior and T2 superior endplate compression fractures, also present on the comparison studies. Solid C5-6 ACDF. Soft tissues and spinal canal: No prevertebral fluid or swelling. No visible canal hematoma. Disc levels: Mild to moderate disc degeneration at C6-7. No evidence of high-grade stenosis. Upper chest: No mass or consolidation in the included lung apices. Other: Mild calcified atherosclerosis at the carotid bifurcations. IMPRESSION: 1. No evidence of acute intracranial abnormality. 2. No acute cervical spine fracture. Electronically Signed   By: Sebastian Ache M.D.   On: 12/08/2022 11:08   CT CERVICAL SPINE WO CONTRAST  Result Date: 12/08/2022 CLINICAL DATA:  Head trauma, minor (Age >= 65y); Polytrauma, blunt. Found down/fall. Recent headache. EXAM: CT HEAD WITHOUT CONTRAST CT CERVICAL SPINE WITHOUT CONTRAST TECHNIQUE: Multidetector CT imaging of the head and cervical spine was performed following the standard protocol without intravenous contrast.  Multiplanar CT image reconstructions of the cervical spine were also generated. RADIATION DOSE REDUCTION: This exam was performed according to the departmental dose-optimization program which includes automated exposure control, adjustment of the  mA and/or kV according to patient size and/or use of iterative reconstruction technique. COMPARISON:  CT head, cervical spine, and thoracic spine 05/17/2022 FINDINGS: CT HEAD FINDINGS Brain: There is no evidence of an acute infarct, intracranial hemorrhage, mass, midline shift, or extra-axial fluid collection. Mild cerebral atrophy is within normal limits for age. Cerebral white matter hypodensities are unchanged and nonspecific but compatible with mild chronic small vessel ischemic disease. Vascular: Calcified atherosclerosis at the skull base. No hyperdense vessel. Skull: No acute fracture or suspicious osseous lesion. Sinuses/Orbits: Paranasal sinuses and mastoid air cells are clear. Bilateral cataract extraction. Other: None. CT CERVICAL SPINE FINDINGS Alignment: Chronic trace retrolisthesis of C3 on C4 and trace anterolisthesis of C7 on T1. Skull base and vertebrae: No acute fracture or suspicious osseous lesion. Mild chronic C7 inferior and T2 superior endplate compression fractures, also present on the comparison studies. Solid C5-6 ACDF. Soft tissues and spinal canal: No prevertebral fluid or swelling. No visible canal hematoma. Disc levels: Mild to moderate disc degeneration at C6-7. No evidence of high-grade stenosis. Upper chest: No mass or consolidation in the included lung apices. Other: Mild calcified atherosclerosis at the carotid bifurcations. IMPRESSION: 1. No evidence of acute intracranial abnormality. 2. No acute cervical spine fracture. Electronically Signed   By: Sebastian Ache M.D.   On: 12/08/2022 11:08   DG Hip Unilat W or Wo Pelvis 2-3 Views Right  Result Date: 12/08/2022 CLINICAL DATA:  Fall EXAM: DG HIP (WITH OR WITHOUT PELVIS) 2-3V RIGHT  COMPARISON:  12/16/2013 FINDINGS: Acute fracture of the right femoral neck, which appears predominantly subcapital in location. There is fracture shortening and varus angulation. No evidence of fracture extension to the intertrochanteric region. Hip joint alignment is maintained without dislocation. Mild to moderate diffuse right hip joint space narrowing. IMPRESSION: Acute fracture of the right femoral neck. Electronically Signed   By: Duanne Guess D.O.   On: 12/08/2022 10:34   DG Chest Portable 1 View  Result Date: 12/08/2022 CLINICAL DATA:  Fall EXAM: PORTABLE CHEST 1 VIEW COMPARISON:  05/17/2022 FINDINGS: Right-sided implanted cardiac device remains in place. Stable heart size. Aortic and mitral annulus calcification. No focal airspace consolidation, pleural effusion, or pneumothorax. Bones are demineralized. No acute bony abnormalities are identified. IMPRESSION: No active disease. Electronically Signed   By: Duanne Guess D.O.   On: 12/08/2022 10:33    ROS: No recent fever, chills, nausea, vomiting or changes in her appetite PE:  Blood pressure (!) 173/71, pulse 98, temperature 98.6 F (37 C), resp. rate 19, height 5\' 1"  (1.549 m), weight 66.7 kg, SpO2 95 %. Thin chronically ill-appearing elderly woman in no apparent distress.  She has a significant tremor of the right upper extremity.  She is alert and oriented to person and place.  She is hard of hearing.  Extraocular motions are intact.  Respirations are unlabored.  The right lower extremity is slightly shortened and internally rotated.  2+ dorsalis pedis pulse at the foot.  Active plantarflexion and dorsiflexion strength of the toes.  Intact sensibility to light touch dorsally and plantarly at the foot.  Assessment/Plan: Right displaced femoral neck fracture -today we talked about treatment options in detail.  We discussed the risks and benefits of surgical treatment versus palliative care with closed management of this displaced  femoral neck fracture.  The patient and her family members understand the risks and benefits of the alternative treatment options.  They will talk over these options and decide how they want to proceed.  She  is already under care of hospice.  A hospice nurse has already been by to check on her.  Toni Arthurs 12/08/2022, 5:20 PM

## 2022-12-08 NOTE — Progress Notes (Signed)
88 y/o female with displaced right femoral neck fracture.  She will need hemi arthroplasty or THA in the next couple of days.  Please hold blood thinners.  Full consult note to follow.

## 2022-12-08 NOTE — Progress Notes (Signed)
[facility] [id number] [facility]) Hospitalized Hospice patient visit   [name] is a current [facility] hospice patient with a terminal diagnosis of parkinson's disease without dyskinesia and dementia.  Patient has secondary diagnoses of stage IV chronic kidney disease and heart failure. Patient was transported to the hospital 5.13 via EMS after patient was found in the floor of her room at the [facility] [place] [facility].  Patient's code status with [facility] is DNR.  Met with bedside RN who states patient arrived to floor from ER around 1 hour ago and is resting comfortably with daughter by her side.  Rounded with patient and daughter in room. Patient states she is in pain but she is unsure if she wants pain medication.  Daughter states she has been confused all day but she feels she is grimacing and could use pain medication.  She is additionally asking about patient being able to eat/drink.  Relayed concerns to bedside RN who will follow up on securing diet orders for patient.  She is also looking into if patient can have pain medication at this time. Liaison contact information provided to patient's daughter in case additional questions arise.    Vital Signs- 98.6/98/19    173/71   95% Room Air I&O- none recorded Abnormal Labs- WBC 17.5, RBC 3.72, Troponin 43, Glucose 111 Diagnostics- CT head, CT c-spine, chest xray, hip/pelvis xray   IV/PRN Meds- Oxycodone Problem List per Hospitalist: Assessment/Plan Pleasantly demented 88 year old female with history of hypertension, hyperlipidemia, GERD be admitted to the hospital with a mechanical fall overnight, after she thought she saw her deceased husband. -Inpatient admission -Pain and nausea control as needed -Await formal orthopedic surgery consultation and recommendations -Will keep n.p.o. until surgical planning is clarified   Confusion-will check urinalysis and culture if indicated     GERD (gastroesophageal reflux disease)-Home PPI twice daily    Prediabetes   (HFpEF) heart failure with preserved ejection fraction (HCC)-continue present home medications, no evidence of acute heart failure   Stage 3b chronic kidney disease (CKD) (HCC)    Discharge Planning- Ongoing, to surgery in next 1-2 days  Family Contact- Family at bedside.  Offered emotional support. Liaison contact information left if needed  IDT: updated [facility] team Goals of Care: Likely surgery next 1-2 days to repair displaced right femoral neck fracture. Discharge planning ongoing.  Thank you for the opportunity to participate in this patient's care, please don't hesitate to call for any hospice related questions or concerns.   Doreatha Martin, RN, BSN [facility] Liaison [phone]

## 2022-12-08 NOTE — ED Notes (Signed)
ED TO INPATIENT HANDOFF REPORT  Name/Age/Gender [name] 88 y.o. female  Code Status    Code Status Orders  (From admission, onward)           Start     Ordered   12/08/22 1324  Do not attempt resuscitation (DNR)  Continuous       Question Answer Comment  If patient has no pulse and is not breathing Do Not Attempt Resuscitation   If patient has a pulse and/or is breathing: Medical Treatment Goals LIMITED ADDITIONAL INTERVENTIONS: Use medication/IV fluids and cardiac monitoring as indicated; Do not use intubation or mechanical ventilation (DNI), also provide comfort medications.  Transfer to Progressive/Stepdown as indicated, avoid Intensive Care.   Consent: Discussion documented in EHR or advanced directives reviewed      12/08/22 1324           Code Status History     Date Active Date Inactive Code Status Order ID Comments User Context   05/17/2022 1450 05/19/2022 2011 Full Code [id number]  Burnadette Pop, MD ED   10/07/2019 1307 10/07/2019 2021 Full Code [id number]  Corky Crafts, MD Inpatient   06/08/2017 1100 06/09/2017 0316 Full Code [id number]  Sebastian Ache, MD HOV   05/05/2016 1032 05/06/2016 1207 Full Code [id number]  Marinus Maw, MD Inpatient       Home/SNF/Other Skilled nursing facility  Chief Complaint Closed right hip fracture, initial encounter (HCC) [S72.001A]  Level of Care/Admitting Diagnosis ED Disposition     ED Disposition  Admit   Condition  --   Comment  Hospital Area: [facility] [[id number]]  Level of Care: Med-Surg [16]  May admit patient to [facility] or [facility] if equivalent level of care is available:: Yes  Covid Evaluation: Asymptomatic - no recent exposure (last 10 days) testing not required  Diagnosis: Closed right hip fracture, initial encounter [facility]) [[id number]]  Admitting Physician: Maryln Gottron [[id number]]  Attending Physician: Olexa.Dam, MIR Jaxson.Roy [[id number]]  Certification:: I certify this  patient will need inpatient services for at least 2 midnights  Estimated Length of Stay: 3          Medical History Past Medical History:  Diagnosis Date   Allergy    Anemia    Cancer of left breast (HCC) 1978   CHF (congestive heart failure) (HCC)    Chronic thoracic back pain    "T8; fracture; 03/2015; no OR" (05/05/2016)   GERD (gastroesophageal reflux disease)    Heart murmur    History of hiatal hernia    Hyperlipidemia    Hyperplastic colon polyp    Hypertension    IBS (irritable bowel syndrome)    Multiple thyroid nodules    Osteoporosis    T8 compression fx 03/2015    Personal history of radiation therapy    Presence of permanent cardiac pacemaker    Vitamin B12 deficiency     Allergies Allergies  Allergen Reactions   Statins Other (See Comments)    Lipitor caused hospitalization - - depletion of electrolytes, fever, nausea, loss of appetite, couldn't get out of bed   Cymbalta [Duloxetine Hcl] Other (See Comments)    Dulled her too much, difficulty urinating, change in vision- "Allergic," per [facility]   Fosamax [Alendronate Sodium] Other (See Comments)    Caused chronic issues swallowing   Amitriptyline Other (See Comments)    Hallucinations    Fosamax [Alendronate] Other (See Comments)    "Allergic," per [facility]   Lyrica [Pregabalin] Other (See  Comments)    "Allergic," per [name]   Ranitidine Other (See Comments)    "Allergic," per [facility]   Neurontin [Gabapentin] Other (See Comments)    Dizziness and sedation (patient is tolerating in lower dose)- "Allergic," per [name]   Zanaflex [Tizanidine] Other (See Comments)    Could not sleep    IV Location/Drains/Wounds Patient Lines/Drains/Airways Status     Active Line/Drains/Airways     None            Labs/Imaging Results for orders placed or performed during the hospital encounter of 12/08/22 (from the past 48 hour(s))  CBG monitoring, ED     Status: Abnormal   Collection Time: 12/08/22  9:54 AM  Result Value  Ref Range   Glucose-Capillary 111 (H) 70 - 99 mg/dL    Comment: Glucose reference range applies only to samples taken after fasting for at least 8 hours.  CBC with Differential     Status: Abnormal   Collection Time: 12/08/22 10:10 AM  Result Value Ref Range   WBC 17.5 (H) 4.0 - 10.5 K/uL   RBC 3.72 (L) 3.87 - 5.11 MIL/uL   Hemoglobin 12.0 12.0 - 15.0 g/dL   HCT 16.1 09.6 - 04.5 %   MCV 98.1 80.0 - 100.0 fL   MCH 32.3 26.0 - 34.0 pg   MCHC 32.9 30.0 - 36.0 g/dL   RDW 40.9 81.1 - 91.4 %   Platelets 310 150 - 400 K/uL   nRBC 0.0 0.0 - 0.2 %   Neutrophils Relative % 89 %   Neutro Abs 15.5 (H) 1.7 - 7.7 K/uL   Lymphocytes Relative 4 %   Lymphs Abs 0.8 0.7 - 4.0 K/uL   Monocytes Relative 6 %   Monocytes Absolute 1.1 (H) 0.1 - 1.0 K/uL   Eosinophils Relative 0 %   Eosinophils Absolute 0.0 0.0 - 0.5 K/uL   Basophils Relative 0 %   Basophils Absolute 0.0 0.0 - 0.1 K/uL   Immature Granulocytes 1 %   Abs Immature Granulocytes 0.12 (H) 0.00 - 0.07 K/uL    Comment: Performed at [facility], [address], [place], [place] [address]  Basic metabolic panel     Status: Abnormal   Collection Time: 12/08/22 10:10 AM  Result Value Ref Range   Sodium 138 135 - 145 mmol/L   Potassium 3.7 3.5 - 5.1 mmol/L   Chloride 104 98 - 111 mmol/L   CO2 23 22 - 32 mmol/L   Glucose, Bld 117 (H) 70 - 99 mg/dL    Comment: Glucose reference range applies only to samples taken after fasting for at least 8 hours.   BUN 22 8 - 23 mg/dL   Creatinine, Ser 6.21 0.44 - 1.00 mg/dL   Calcium 9.2 8.9 - 30.8 mg/dL   GFR, Estimated 59 (L) >60 mL/min    Comment: (NOTE) Calculated using the CKD-EPI Creatinine Equation (2021)    Anion gap 11 5 - 15    Comment: Performed at [facility], [address] [place], [place] [address]  Protime-INR     Status: None   Collection Time: 12/08/22 10:10 AM  Result Value Ref Range   Prothrombin Time 12.3 11.4 - 15.2 seconds   INR 0.9 0.8 -  1.2    Comment: (NOTE) INR goal varies based on device and disease states. Performed at [facility], [address] [place], [place] [address]   Troponin I (High Sensitivity)     Status: None  Collection Time: 12/08/22 10:10 AM  Result Value Ref Range   Troponin I (High Sensitivity) 17 <18 ng/L    Comment: (NOTE) Elevated high sensitivity troponin I (hsTnI) values and significant  changes across serial measurements may suggest ACS but many other  chronic and acute conditions are known to elevate hsTnI results.  Refer to the "Links" section for chest pain algorithms and additional  guidance. Performed at [facility], [address], [place], [place] [address]    CT HEAD WO CONTRAST ( )  Result Date: 12/08/2022 CLINICAL DATA:  Head trauma, minor (Age >= 65y); Polytrauma, blunt. Found down/fall. Recent headache. EXAM: CT HEAD WITHOUT CONTRAST CT CERVICAL SPINE WITHOUT CONTRAST TECHNIQUE: Multidetector CT imaging of the head and cervical spine was performed following the standard protocol without intravenous contrast. Multiplanar CT image reconstructions of the cervical spine were also generated. RADIATION DOSE REDUCTION: This exam was performed according to the departmental dose-optimization program which includes automated exposure control, adjustment of the mA and/or kV according to patient size and/or use of iterative reconstruction technique. COMPARISON:  CT head, cervical spine, and thoracic spine 05/17/2022 FINDINGS: CT HEAD FINDINGS Brain: There is no evidence of an acute infarct, intracranial hemorrhage, mass, midline shift, or extra-axial fluid collection. Mild cerebral atrophy is within normal limits for age. Cerebral white matter hypodensities are unchanged and nonspecific but compatible with mild chronic small vessel ischemic disease. Vascular: Calcified atherosclerosis at the skull base. No hyperdense vessel. Skull: No acute fracture or  suspicious osseous lesion. Sinuses/Orbits: Paranasal sinuses and mastoid air cells are clear. Bilateral cataract extraction. Other: None. CT CERVICAL SPINE FINDINGS Alignment: Chronic trace retrolisthesis of C3 on C4 and trace anterolisthesis of C7 on T1. Skull base and vertebrae: No acute fracture or suspicious osseous lesion. Mild chronic C7 inferior and T2 superior endplate compression fractures, also present on the comparison studies. Solid C5-6 ACDF. Soft tissues and spinal canal: No prevertebral fluid or swelling. No visible canal hematoma. Disc levels: Mild to moderate disc degeneration at C6-7. No evidence of high-grade stenosis. Upper chest: No mass or consolidation in the included lung apices. Other: Mild calcified atherosclerosis at the carotid bifurcations. IMPRESSION: 1. No evidence of acute intracranial abnormality. 2. No acute cervical spine fracture. Electronically Signed   By: Sebastian Ache M.D.   On: 12/08/2022 11:08   CT CERVICAL SPINE WO CONTRAST  Result Date: 12/08/2022 CLINICAL DATA:  Head trauma, minor (Age >= 65y); Polytrauma, blunt. Found down/fall. Recent headache. EXAM: CT HEAD WITHOUT CONTRAST CT CERVICAL SPINE WITHOUT CONTRAST TECHNIQUE: Multidetector CT imaging of the head and cervical spine was performed following the standard protocol without intravenous contrast. Multiplanar CT image reconstructions of the cervical spine were also generated. RADIATION DOSE REDUCTION: This exam was performed according to the departmental dose-optimization program which includes automated exposure control, adjustment of the mA and/or kV according to patient size and/or use of iterative reconstruction technique. COMPARISON:  CT head, cervical spine, and thoracic spine 05/17/2022 FINDINGS: CT HEAD FINDINGS Brain: There is no evidence of an acute infarct, intracranial hemorrhage, mass, midline shift, or extra-axial fluid collection. Mild cerebral atrophy is within normal limits for age. Cerebral white  matter hypodensities are unchanged and nonspecific but compatible with mild chronic small vessel ischemic disease. Vascular: Calcified atherosclerosis at the skull base. No hyperdense vessel. Skull: No acute fracture or suspicious osseous lesion. Sinuses/Orbits: Paranasal sinuses and mastoid air cells are clear. Bilateral cataract extraction. Other: None. CT CERVICAL SPINE FINDINGS Alignment: Chronic trace retrolisthesis of C3 on  C4 and trace anterolisthesis of C7 on T1. Skull base and vertebrae: No acute fracture or suspicious osseous lesion. Mild chronic C7 inferior and T2 superior endplate compression fractures, also present on the comparison studies. Solid C5-6 ACDF. Soft tissues and spinal canal: No prevertebral fluid or swelling. No visible canal hematoma. Disc levels: Mild to moderate disc degeneration at C6-7. No evidence of high-grade stenosis. Upper chest: No mass or consolidation in the included lung apices. Other: Mild calcified atherosclerosis at the carotid bifurcations. IMPRESSION: 1. No evidence of acute intracranial abnormality. 2. No acute cervical spine fracture. Electronically Signed   By: Sebastian Ache M.D.   On: 12/08/2022 11:08   DG Hip Unilat W or Wo Pelvis 2-3 Views Right  Result Date: 12/08/2022 CLINICAL DATA:  Fall EXAM: DG HIP (WITH OR WITHOUT PELVIS) 2-3V RIGHT COMPARISON:  12/16/2013 FINDINGS: Acute fracture of the right femoral neck, which appears predominantly subcapital in location. There is fracture shortening and varus angulation. No evidence of fracture extension to the intertrochanteric region. Hip joint alignment is maintained without dislocation. Mild to moderate diffuse right hip joint space narrowing. IMPRESSION: Acute fracture of the right femoral neck. Electronically Signed   By: Duanne Guess D.O.   On: 12/08/2022 10:34   DG Chest Portable 1 View  Result Date: 12/08/2022 CLINICAL DATA:  Fall EXAM: PORTABLE CHEST 1 VIEW COMPARISON:  05/17/2022 FINDINGS:  Right-sided implanted cardiac device remains in place. Stable heart size. Aortic and mitral annulus calcification. No focal airspace consolidation, pleural effusion, or pneumothorax. Bones are demineralized. No acute bony abnormalities are identified. IMPRESSION: No active disease. Electronically Signed   By: Duanne Guess D.O.   On: 12/08/2022 10:33    Pending Labs Unresulted Labs (From admission, onward)     Start     Ordered   12/09/22 0500  Basic metabolic panel  Tomorrow morning,   R        12/08/22 1324   12/09/22 0500  CBC  Tomorrow morning,   R        12/08/22 1324            Vitals/Pain Today's Vitals   12/08/22 0949 12/08/22 0950 12/08/22 1005 12/08/22 1200  BP:   (!) 183/65 (!) 169/69  Pulse:   (!) 106 (!) 102  Resp:   19 (!) 24  Temp:   98.1 F (36.7 C) 97.6 F (36.4 C)  TempSrc:   Oral Axillary  SpO2:   98% 98%  Weight:  66.7 kg    Height:  5\' 1"  (1.549 m)    PainSc: 0-No pain       Isolation Precautions No active isolations  Medications Medications  pantoprazole (PROTONIX) EC tablet 40 mg (has no administration in time range)  escitalopram (LEXAPRO) tablet 10 mg (has no administration in time range)  polyethylene glycol (MIRALAX / GLYCOLAX) packet 17 g (has no administration in time range)  senna-docusate (Senokot-S) tablet 1 tablet (has no administration in time range)  enoxaparin (LOVENOX) injection 40 mg (has no administration in time range)  acetaminophen (TYLENOL) tablet 650 mg (has no administration in time range)    Or  acetaminophen (TYLENOL) suppository 650 mg (has no administration in time range)  oxyCODONE (Oxy IR/ROXICODONE) immediate release tablet 5 mg (has no administration in time range)  ondansetron (ZOFRAN) tablet 4 mg (has no administration in time range)    Or  ondansetron (ZOFRAN) injection 4 mg (has no administration in time range)  albuterol (PROVENTIL) (2.5 MG/3ML) 0.083% nebulizer solution  2.5 mg (has no administration in  time range)  oxyCODONE (Oxy IR/ROXICODONE) immediate release tablet 5 mg (5 mg Oral Given 12/08/22 1136)    Mobility non-ambulatory

## 2022-12-09 ENCOUNTER — Inpatient Hospital Stay (HOSPITAL_COMMUNITY)

## 2022-12-09 ENCOUNTER — Inpatient Hospital Stay (HOSPITAL_COMMUNITY): Admitting: Anesthesiology

## 2022-12-09 DIAGNOSIS — S72001A Fracture of unspecified part of neck of right femur, initial encounter for closed fracture: Secondary | ICD-10-CM | POA: Diagnosis not present

## 2022-12-09 LAB — BASIC METABOLIC PANEL
Anion gap: 9 (ref 5–15)
BUN: 16 mg/dL (ref 8–23)
CO2: 22 mmol/L (ref 22–32)
Calcium: 8.5 mg/dL — ABNORMAL LOW (ref 8.9–10.3)
Chloride: 105 mmol/L (ref 98–111)
Creatinine, Ser: 0.75 mg/dL (ref 0.44–1.00)
GFR, Estimated: >60 mL/min (ref >60)
Glucose, Bld: 110 mg/dL — ABNORMAL HIGH (ref 70–99)
Potassium: 3.4 mmol/L — ABNORMAL LOW (ref 3.5–5.1)
Sodium: 136 mmol/L (ref 135–145)

## 2022-12-09 LAB — CBC
HCT: 32.9 % — ABNORMAL LOW (ref 36.0–46.0)
Hemoglobin: 10.8 g/dL — ABNORMAL LOW (ref 12.0–15.0)
MCH: 31.9 pg (ref 26.0–34.0)
MCHC: 32.8 g/dL (ref 30.0–36.0)
MCV: 97.1 fL (ref 80.0–100.0)
Platelets: 263 10*3/uL (ref 150–400)
RBC: 3.39 MIL/uL — ABNORMAL LOW (ref 3.87–5.11)
RDW: 13.7 % (ref 11.5–15.5)
WBC: 10.2 10*3/uL (ref 4.0–10.5)
nRBC: 0 % (ref 0.0–0.2)

## 2022-12-09 LAB — SURGICAL PCR SCREEN
MRSA, PCR: NEGATIVE
Staphylococcus aureus: NEGATIVE

## 2022-12-09 LAB — VITAMIN D 25 HYDROXY (VIT D DEFICIENCY, FRACTURES): Vit D, 25-Hydroxy: 34.29 ng/mL (ref 30–100)

## 2022-12-09 MED ORDER — VITAMIN B-12 1000 MCG PO TABS
1000.0000 ug | ORAL_TABLET | Freq: Every day | ORAL | Status: DC
  Administered 2022-12-09 – 2022-12-12 (×3): 1000 ug via ORAL
  Filled 2022-12-09 (×3): qty 1

## 2022-12-09 MED ORDER — SODIUM CHLORIDE 0.9 % IV SOLN
1.0000 g | INTRAVENOUS | Status: DC
  Administered 2022-12-09 – 2022-12-10 (×2): 1 g via INTRAVENOUS
  Filled 2022-12-09 (×2): qty 10

## 2022-12-09 MED ORDER — FENTANYL CITRATE PF 50 MCG/ML IJ SOSY
PREFILLED_SYRINGE | INTRAMUSCULAR | Status: AC
  Administered 2022-12-09: 50 ug
  Filled 2022-12-09: qty 2

## 2022-12-09 MED ORDER — CLONIDINE HCL (ANALGESIA) 100 MCG/ML EP SOLN
EPIDURAL | Status: DC | PRN
  Administered 2022-12-09: 50 ug

## 2022-12-09 MED ORDER — POTASSIUM CHLORIDE CRYS ER 20 MEQ PO TBCR
30.0000 meq | EXTENDED_RELEASE_TABLET | ORAL | Status: AC
  Administered 2022-12-09 (×2): 30 meq via ORAL
  Filled 2022-12-09 (×2): qty 1

## 2022-12-09 MED ORDER — MORPHINE SULFATE (PF) 2 MG/ML IV SOLN: 1.0000 mg | INTRAVENOUS | Status: DC | PRN

## 2022-12-09 MED ORDER — LORAZEPAM 0.5 MG PO TABS: 0.5000 mg | ORAL_TABLET | Freq: Three times a day (TID) | ORAL | Status: DC | PRN

## 2022-12-09 MED ORDER — SODIUM CHLORIDE 0.9 % IV SOLN: INTRAVENOUS | Status: DC | PRN

## 2022-12-09 MED ORDER — METHOCARBAMOL 500 MG PO TABS
500.0000 mg | ORAL_TABLET | Freq: Four times a day (QID) | ORAL | Status: DC | PRN
  Administered 2022-12-11: 500 mg via ORAL
  Filled 2022-12-09 (×2): qty 1

## 2022-12-09 MED ORDER — BUPIVACAINE HCL (PF) 0.5 % IJ SOLN
INTRAMUSCULAR | Status: DC | PRN
  Administered 2022-12-09: 25 mL via PERINEURAL

## 2022-12-09 MED ORDER — HYDRALAZINE HCL 20 MG/ML IJ SOLN: 5.0000 mg | Freq: Four times a day (QID) | INTRAMUSCULAR | Status: DC | PRN

## 2022-12-09 NOTE — TOC Initial Note (Addendum)
Transition of Care [facility]) - Initial/Assessment Note   Patient Details  Name: [name] MRN: [id number] Date of Birth: [name] 13, 1935  [facility]) CM/SW Contact:    Ewing Schlein, LCSW Phone Number: 12/09/2022, 2:31 PM  Clinical Narrative: CSW confirmed patient is active with [facility] hospice at [facility]. CSW attempted to call Deno Lunger at [facility] [phone]) to discuss discharge planning, but was unable to reach her. CSW left message with Dominque and requested call back.  Addendum: 4:04pm-CSW received call from Ms. Rumley. Per Ms. Rumley, the plan is for the patient to return to the ALF with hospice services but the facility will need to assess the patient in-person before she can return. The facility will also require an FL2 at discharge.  Expected Discharge Plan: Assisted Living Barriers to Discharge: Continued Medical Work up  Expected Discharge Plan and Services In-house Referral: Clinical Social Work Post Acute Care Choice: Hospice Living arrangements for the past 2 months: Assisted Living Facility           DME Arranged: N/A DME Agency: NA  Prior Living Arrangements/Services Living arrangements for the past 2 months: Assisted Living Facility Lives with:: Facility Resident Patient language and need for interpreter reviewed:: Yes Need for Family Participation in Patient Care: Yes (Comment) Care giver support system in place?: Yes (comment) Current home services: Hospice Criminal Activity/Legal Involvement Pertinent to Current Situation/Hospitalization: No - Comment as needed  Activities of Daily Living Home Assistive Devices/Equipment: Wheelchair ADL Screening (condition at time of admission) Patient's cognitive ability adequate to safely complete daily activities?: Yes Is the patient deaf or have difficulty hearing?: Yes (Wears hearing aids) Does the patient have difficulty seeing, even when wearing glasses/contacts?: No Does the patient have  difficulty concentrating, remembering, or making decisions?: Yes Patient able to express need for assistance with ADLs?: Yes Does the patient have difficulty dressing or bathing?: Yes Independently performs ADLs?: No Communication: Independent Dressing (OT): Needs assistance Is this a change from baseline?: Pre-admission baseline Grooming: Needs assistance Is this a change from baseline?: Pre-admission baseline Feeding: Needs assistance Is this a change from baseline?: Pre-admission baseline Bathing: Needs assistance Is this a change from baseline?: Pre-admission baseline Toileting: Needs assistance Is this a change from baseline?: Pre-admission baseline In/Out Bed: Dependent Is this a change from baseline?: Pre-admission baseline Walks in Home: Dependent Is this a change from baseline?: Pre-admission baseline Does the patient have difficulty walking or climbing stairs?: Yes Weakness of Legs: Both Weakness of Arms/Hands: Both  Emotional Assessment Orientation: : Oriented to Self Alcohol / Substance Use: Not Applicable Psych Involvement: No (comment)  Admission diagnosis:  Closed right hip fracture, initial encounter (HCC) [S72.001A] Patient Active Problem List   Diagnosis Date Noted   Closed right hip fracture, initial encounter (HCC) 12/08/2022   Multiple rib fractures 05/18/2022   Thoracic spine fracture (HCC) 05/18/2022   Acute back pain 05/18/2022   Stage 3b chronic kidney disease (CKD) (HCC) 05/18/2022   Fall 05/17/2022   IBS (irritable bowel syndrome) 10/28/2020   Tremor of both hands 07/30/2020   Left arm swelling 04/10/2020   Aortic atherosclerosis (HCC) 03/30/2020   Cellulitis of arm, left 03/30/2020   Chronic thoracic back pain 02/03/2020   Iron deficiency anemia 02/02/2020   Near syncope 09/27/2019   Hypertension 09/20/2019   Acute pain of right knee 08/03/2019   (HFpEF) heart failure with preserved ejection fraction (HCC) 08/02/2019   Pacemaker 07/19/2019    CKD (chronic kidney disease) 01/30/2019   Lumbar spinal  stenosis 01/10/2018   Degenerative arthritis of knee, bilateral 12/15/2017   Epigastric pain 09/09/2017   Neuralgia 09/09/2017   Claudication of calf muscles (HCC) 11/22/2016   DOE (dyspnea on exertion) 11/22/2016   Hyperreflexia of lower extremity 07/16/2016   Bilateral leg paresthesia 07/16/2016   Carotid arterial disease (HCC) 07/13/2016   Poor balance 06/23/2016   Weakness of both lower extremities 06/23/2016   Sinus node dysfunction (HCC) 05/05/2016   Right shoulder pain 03/14/2016   Multinodular goiter (nontoxic) 02/21/2016   Neck pain 02/15/2016   Prediabetes 12/17/2015   Dysphagia 12/17/2015   Anxiety 11/13/2015   T8 vertebral fracture (HCC) 04/09/2015   Overweight 10/09/2011   Allergic rhinitis, cause unspecified    GERD (gastroesophageal reflux disease) 02/05/2011   Vitamin B 12 deficiency 04/17/2009   Hyperlipidemia 04/17/2009   Osteoporosis 04/17/2009   ADENOCARCINOMA, BREAST, HX OF 04/17/2009   Constipation 03/08/2009   COLONIC POLYPS, HYPERPLASTIC, HX OF 03/06/2009   PCP:  Pcp, No Pharmacy:   [facility] [place], [place] - [address] [place] [address] Phone: [phone] Fax: [phone]  Social Determinants of Health (SDOH) Social History: SDOH Screenings   Food Insecurity: No Food Insecurity (12/08/2022)  Housing: Low Risk  (12/08/2022)  Transportation Needs: No Transportation Needs (12/08/2022)  Utilities: Not At Risk (12/08/2022)  Alcohol Screen: Low Risk  (04/04/2021)  Depression (PHQ2-9): Low Risk  (04/04/2021)  Financial Resource Strain: Low Risk  (04/04/2021)  Physical Activity: Inactive (04/04/2021)  Social Connections: Socially Integrated (04/04/2021)  Stress: No Stress Concern Present (04/04/2021)  Tobacco Use: Low Risk  (12/08/2022)   SDOH Interventions:    Readmission Risk Interventions     No data to display

## 2022-12-09 NOTE — Progress Notes (Signed)
PROGRESS NOTE    [name]  WUJ:[id number] DOB: 1934/09/27 DOA: 12/08/2022 PCP: Pcp, No    Brief Narrative:   [name] is a 88 y.o. female with past medical history significant for dementia, parkinsonism, essential hypertension, sinus node dysfunction s/p PPM, diastolic congestive heart failure, CKD stage IV, GAD/depression, hyperlipidemia, GERD who presented to [facility] ED on 5/13 from her facility [place] [facility] [place] via EMS after she was found on the floor of her room at her facility.  Workup in the ED with noted right hip fracture and urinalysis concerning for UTI.  CT head/C-spine unrevealing.  Orthopedics was consulted and [facility] consulted for admission for further evaluation management.  Assessment & Plan:   Right hip fracture Patient was found on the floor at her facility.  Complaining of pain.  Right hip/pelvis x-ray with acute fracture of the right femoral neck. -- Orthopedics following, appreciate assistance -- Oxycodone 5 mg p.o. every 4 hours as needed moderate pain -- Morphine 1 mg IV every 2 hours as needed severe pain -- Robaxin 500 mg p.o. every 6 hours as needed muscle spasm -- N.p.o. after midnight, orthopedics plans operative management on 5/15 -- Plan to return to [facility] so when stable for discharge  Urinary tract infection Urinalysis with no leukocytes, positive nitrite, many bacteria, 0-5 WBCs. -- Urine culture: Pending -- Ceftriaxone 1 g IV every 24 hours  Essential hypertension Sinus node dysfunction s/p PPM Chronic diastolic congestive heart failure, compensated Follows with cardiology outpatient.  EKG on admission with sinus tachycardia, rate 107. -- Not on any antihypertensives or rate controlling medications at baseline -- Hydralazine 5 mg IV q6h PRN SBP >165  CKD stage IV Creatinine 0.75, stable  GAD/depression -- Lexapro 10 mg p.o. daily -- Lorazepam 0.5 mg p.o. 3 times daily as needed anxiety  Hyperlipidemia --  Currently not on any medication outpatient  GERD -- Protonix 40 mg p.o. twice daily substituted for home omeprazole  Dementia, parkinsonism Follows with neurology outpatient, Dr. Arbutus Leas.  Also hospice outpatient.   DVT prophylaxis: SCDs Start: 12/08/22 1324    Code Status: DNR Family Communication: No family present at bedside this morning  Disposition Plan:  Level of care: Med-Surg Status is: Inpatient Remains inpatient appropriate because: Pending operative management for hip fracture    Consultants:  Ortho, Dr. Linna Caprice  Procedures:  None  Antimicrobials:  Ceftriaxone 5/14>>   Subjective: Patient seen examined bedside, resting comfortably.  Lying in bed.  No family present.  Pleasantly confused.  Does not appear to be in significant pain at this time.  Heart rate within normal limits.  Unable to obtain any further ROS from the patient.  Seen by orthopedics with plan for operative management tomorrow.  No acute concerns overnight per nursing staff.  Objective: Vitals:   12/08/22 2146 12/09/22 0059 12/09/22 0632 12/09/22 1013  BP: (!) 168/71 (!) 188/67 (!) 155/57 (!) 151/76  Pulse: 92 91 85 87  Resp: 17 18 17 18   Temp: 98.3 F (36.8 C) 98.7 F (37.1 C) 99 F (37.2 C) 98.3 F (36.8 C)  TempSrc: Oral Oral Oral Oral  SpO2: 95% 97% 96% 96%  Weight:      Height:        Intake/Output Summary (Last 24 hours) at 12/09/2022 1054 Last data filed at 12/09/2022 0635 Gross per 24 hour  Intake --  Output 300 ml  Net -300 ml   Filed Weights   12/08/22 0950  Weight: 66.7 kg  Examination:  Physical Exam: GEN: NAD, alert, pleasant confused, chronically ill/elderly in appearance HEENT: NCAT, PERRL, EOMI, sclera clear, MMM PULM: CTAB w/o wheezes/crackles, normal respiratory effort, on room air CV: RRR w/ + SEM LUSB, no gallop/rub; left chest PPM generator appreciated GI: abd soft, NTND, + BS MSK: no peripheral edema, moves all extremities independently, right lower  extremity slightly shortened with external rotation Integumentary: No concerning rashes/lesions/wounds noted on exposed skin surfaces    Data Reviewed: I have personally reviewed following labs and imaging studies  CBC: Recent Labs  Lab 12/08/22 1010 12/09/22 0350  WBC 17.5* 10.2  NEUTROABS 15.5*  --   HGB 12.0 10.8*  HCT 36.5 32.9*  MCV 98.1 97.1  PLT 310 263   Basic Metabolic Panel: Recent Labs  Lab 12/08/22 1010 12/09/22 0350  NA 138 136  K 3.7 3.4*  CL 104 105  CO2 23 22  GLUCOSE 117* 110*  BUN 22 16  CREATININE 0.95 0.75  CALCIUM 9.2 8.5*   GFR: Estimated Creatinine Clearance: 45 mL/min (by C-G formula based on SCr of 0.75 mg/dL). Liver Function Tests: No results for input(s): "AST", "ALT", "ALKPHOS", "BILITOT", "PROT", "ALBUMIN" in the last 168 hours. No results for input(s): "LIPASE", "AMYLASE" in the last 168 hours. No results for input(s): "AMMONIA" in the last 168 hours. Coagulation Profile: Recent Labs  Lab 12/08/22 1010  INR 0.9   Cardiac Enzymes: No results for input(s): "CKTOTAL", "CKMB", "CKMBINDEX", "TROPONINI" in the last 168 hours. BNP (last 3 results) No results for input(s): "PROBNP" in the last 8760 hours. HbA1C: No results for input(s): "HGBA1C" in the last 72 hours. CBG: Recent Labs  Lab 12/08/22 0954  GLUCAP 111*   Lipid Profile: No results for input(s): "CHOL", "HDL", "LDLCALC", "TRIG", "CHOLHDL", "LDLDIRECT" in the last 72 hours. Thyroid Function Tests: No results for input(s): "TSH", "T4TOTAL", "FREET4", "T3FREE", "THYROIDAB" in the last 72 hours. Anemia Panel: No results for input(s): "VITAMINB12", "FOLATE", "FERRITIN", "TIBC", "IRON", "RETICCTPCT" in the last 72 hours. Sepsis Labs: No results for input(s): "PROCALCITON", "LATICACIDVEN" in the last 168 hours.  No results found for this or any previous visit (from the past 240 hour(s)).       Radiology Studies: DG Knee Right Port  Result Date: 12/09/2022 CLINICAL  DATA:  Closed displaced fracture of right femoral neck. EXAM: PORTABLE RIGHT KNEE - 1-2 VIEW COMPARISON:  Frontal view of the bilateral knees 01/15/2021, right knee radiographs 08/03/2019 FINDINGS: There is minimal medial compartment joint space narrowing. Mild peripheral medial tibial plateau degenerative osteophytosis. No joint effusion. Unchanged cortical thickening that may represent a possible old healed fracture versus chronic stress changes of the proximal diaphysis of the fibula, unchanged from prior. Mild benign-appearing chronic periosteal reaction at the medial aspect of the proximal tibial metadiaphysis is slightly increased from prior and may represent the sequela of chronic stress changes and/or remote trauma. No acute fracture or dislocation. IMPRESSION: 1. Minimal medial compartment osteoarthritis. 2. No acute fracture. Electronically Signed   By: Neita Garnet M.D.   On: 12/09/2022 08:46   CT HEAD WO CONTRAST ( )  Result Date: 12/08/2022 CLINICAL DATA:  Head trauma, minor (Age >= 65y); Polytrauma, blunt. Found down/fall. Recent headache. EXAM: CT HEAD WITHOUT CONTRAST CT CERVICAL SPINE WITHOUT CONTRAST TECHNIQUE: Multidetector CT imaging of the head and cervical spine was performed following the standard protocol without intravenous contrast. Multiplanar CT image reconstructions of the cervical spine were also generated. RADIATION DOSE REDUCTION: This exam was performed according to the departmental dose-optimization program  which includes automated exposure control, adjustment of the mA and/or kV according to patient size and/or use of iterative reconstruction technique. COMPARISON:  CT head, cervical spine, and thoracic spine 05/17/2022 FINDINGS: CT HEAD FINDINGS Brain: There is no evidence of an acute infarct, intracranial hemorrhage, mass, midline shift, or extra-axial fluid collection. Mild cerebral atrophy is within normal limits for age. Cerebral white matter hypodensities are unchanged  and nonspecific but compatible with mild chronic small vessel ischemic disease. Vascular: Calcified atherosclerosis at the skull base. No hyperdense vessel. Skull: No acute fracture or suspicious osseous lesion. Sinuses/Orbits: Paranasal sinuses and mastoid air cells are clear. Bilateral cataract extraction. Other: None. CT CERVICAL SPINE FINDINGS Alignment: Chronic trace retrolisthesis of C3 on C4 and trace anterolisthesis of C7 on T1. Skull base and vertebrae: No acute fracture or suspicious osseous lesion. Mild chronic C7 inferior and T2 superior endplate compression fractures, also present on the comparison studies. Solid C5-6 ACDF. Soft tissues and spinal canal: No prevertebral fluid or swelling. No visible canal hematoma. Disc levels: Mild to moderate disc degeneration at C6-7. No evidence of high-grade stenosis. Upper chest: No mass or consolidation in the included lung apices. Other: Mild calcified atherosclerosis at the carotid bifurcations. IMPRESSION: 1. No evidence of acute intracranial abnormality. 2. No acute cervical spine fracture. Electronically Signed   By: Sebastian Ache M.D.   On: 12/08/2022 11:08   CT CERVICAL SPINE WO CONTRAST  Result Date: 12/08/2022 CLINICAL DATA:  Head trauma, minor (Age >= 65y); Polytrauma, blunt. Found down/fall. Recent headache. EXAM: CT HEAD WITHOUT CONTRAST CT CERVICAL SPINE WITHOUT CONTRAST TECHNIQUE: Multidetector CT imaging of the head and cervical spine was performed following the standard protocol without intravenous contrast. Multiplanar CT image reconstructions of the cervical spine were also generated. RADIATION DOSE REDUCTION: This exam was performed according to the departmental dose-optimization program which includes automated exposure control, adjustment of the mA and/or kV according to patient size and/or use of iterative reconstruction technique. COMPARISON:  CT head, cervical spine, and thoracic spine 05/17/2022 FINDINGS: CT HEAD FINDINGS Brain: There  is no evidence of an acute infarct, intracranial hemorrhage, mass, midline shift, or extra-axial fluid collection. Mild cerebral atrophy is within normal limits for age. Cerebral white matter hypodensities are unchanged and nonspecific but compatible with mild chronic small vessel ischemic disease. Vascular: Calcified atherosclerosis at the skull base. No hyperdense vessel. Skull: No acute fracture or suspicious osseous lesion. Sinuses/Orbits: Paranasal sinuses and mastoid air cells are clear. Bilateral cataract extraction. Other: None. CT CERVICAL SPINE FINDINGS Alignment: Chronic trace retrolisthesis of C3 on C4 and trace anterolisthesis of C7 on T1. Skull base and vertebrae: No acute fracture or suspicious osseous lesion. Mild chronic C7 inferior and T2 superior endplate compression fractures, also present on the comparison studies. Solid C5-6 ACDF. Soft tissues and spinal canal: No prevertebral fluid or swelling. No visible canal hematoma. Disc levels: Mild to moderate disc degeneration at C6-7. No evidence of high-grade stenosis. Upper chest: No mass or consolidation in the included lung apices. Other: Mild calcified atherosclerosis at the carotid bifurcations. IMPRESSION: 1. No evidence of acute intracranial abnormality. 2. No acute cervical spine fracture. Electronically Signed   By: Sebastian Ache M.D.   On: 12/08/2022 11:08   DG Hip Unilat W or Wo Pelvis 2-3 Views Right  Result Date: 12/08/2022 CLINICAL DATA:  Fall EXAM: DG HIP (WITH OR WITHOUT PELVIS) 2-3V RIGHT COMPARISON:  12/16/2013 FINDINGS: Acute fracture of the right femoral neck, which appears predominantly subcapital in location. There is fracture  shortening and varus angulation. No evidence of fracture extension to the intertrochanteric region. Hip joint alignment is maintained without dislocation. Mild to moderate diffuse right hip joint space narrowing. IMPRESSION: Acute fracture of the right femoral neck. Electronically Signed   By: Duanne Guess D.O.   On: 12/08/2022 10:34   DG Chest Portable 1 View  Result Date: 12/08/2022 CLINICAL DATA:  Fall EXAM: PORTABLE CHEST 1 VIEW COMPARISON:  05/17/2022 FINDINGS: Right-sided implanted cardiac device remains in place. Stable heart size. Aortic and mitral annulus calcification. No focal airspace consolidation, pleural effusion, or pneumothorax. Bones are demineralized. No acute bony abnormalities are identified. IMPRESSION: No active disease. Electronically Signed   By: Duanne Guess D.O.   On: 12/08/2022 10:33        Scheduled Meds:  escitalopram  10 mg Oral Daily   pantoprazole  40 mg Oral BID AC   potassium chloride  30 mEq Oral Q3H   Continuous Infusions:  sodium chloride 10 mL/hr at 12/09/22 0957   cefTRIAXone (ROCEPHIN)  IV 1 g (12/09/22 0959)     LOS: 1 day    Time spent: 51 minutes spent on chart review, discussion with nursing staff, consultants, updating family and interview/physical exam; more than 50% of that time was spent in counseling and/or coordination of care.    Alvira Philips [place], DO [facility] Available via Epic secure chat 7am-7pm After these hours, please refer to coverage provider listed on [email or web address] 12/09/2022, 10:54 AM

## 2022-12-09 NOTE — Progress Notes (Signed)
[id number]) Hospitalized Hospice patient visit   [name] is a current [facility] hospice patient with a terminal diagnosis of parkinson's disease without dyskinesia and dementia.  Patient has secondary diagnoses of stage IV chronic kidney disease and heart failure. Patient was transported to the hospital 5.13 via EMS after patient was found in the floor of her room at the [place] [facility] [place].  Patient's code status with ACC is DNR.  Met with bedside RN in room along with patient.  Patient appears more alert than yesterday.  She states she is "feeling good."  Bedside RN states tentative hold for patient for tomorrow to have surgery but noted family is still trying to make decision if they would like to proceed with surgery.  Called patient's daughter [name] via phone who state her and patient's son are still in discussions on if surgery is best idea for patient but they are leaning toward surgery so that patient can continue to transfer from bed to wheelchair and not be totally bedbound.  They do understand risks of anesthesia so they are carefully weighing options.  Daughter denies additional needs at this point but has liaison contact if needed.   Vital Signs- 98.3/87/18    151/76   96% Room Air I&O- 0/300 Abnormal Labs- RBC 3.39, Hgb 10.8, Hct 32.9, Potassium 3.4, Calcium 8.5, Troponin 43, Glucose 110 Diagnostics- Right knee xray   IV/PRN Meds- Oxy IR, Fentanyl Problem List per Hospitalist: Assessment & Plan: Right hip fracture Patient was found on the floor at her facility.  Complaining of pain.  Right hip/pelvis x-ray with acute fracture of the right femoral neck. -- Orthopedics following, appreciate assistance -- Oxycodone 5 mg p.o. every 4 hours as needed moderate pain -- Morphine 1 mg IV every 2 hours as needed severe pain -- Robaxin 500 mg p.o. every 6 hours as needed muscle spasm -- N.p.o. after midnight, orthopedics plans operative management on 5/15 --  Plan to return to [facility] so when stable for discharge  Urinary tract infection Urinalysis with no leukocytes, positive nitrite, many bacteria, 0-5 WBCs. -- Urine culture: Pending -- Ceftriaxone 1 g IV every 24 hours  Essential hypertension Sinus node dysfunction s/p PPM Chronic diastolic congestive heart failure, compensated Follows with cardiology outpatient.  EKG on admission with sinus tachycardia, rate 107. -- Not on any antihypertensives or rate controlling medications at baseline -- Hydralazine 5 mg IV q6h PRN SBP >165   CKD stage IV Creatinine 0.75, stable  GAD/depression -- Lexapro 10 mg p.o. daily -- Lorazepam 0.5 mg p.o. 3 times daily as needed anxiety  Hyperlipidemia -- Currently not on any medication outpatient  GERD -- Protonix 40 mg p.o. twice daily substituted for home omeprazole  Dementia, parkinsonism Follows with neurology outpatient, Dr. Arbutus Leas.  Also hospice outpatient.    Discharge Planning- Ongoing, to surgery in next 1-2 days  Family Contact- Spoke to patient's daughter via phone, no identified needs at this time.  IDT: updated ACC team Goals of Care: Possible surgery tomorrow, family trying to decide. Back to SNF post surgery--may need higher level of care; from AL at [facility].  Thank you for the opportunity to participate in this patient's care, please don't hesitate to call for any hospice related questions or concerns.   [name] Martin, RN, BSN [facility] Liaison [phone]

## 2022-12-09 NOTE — Anesthesia Preprocedure Evaluation (Addendum)
Anesthesia Evaluation  Patient identified by MRN, date of birth, ID band Patient confused    Reviewed: Allergy & Precautions, Patient's Chart, lab work & pertinent test results  History of Anesthesia Complications Negative for: history of anesthetic complications  Airway Mallampati: Unable to assess       Dental   Pulmonary    Pulmonary exam normal        Cardiovascular hypertension, +CHF  Normal cardiovascular exam+ pacemaker      Neuro/Psych  PSYCHIATRIC DISORDERS Anxiety    Dementia  Parkinson's dementia     GI/Hepatic hiatal hernia,GERD  Medicated,,  Endo/Other    Renal/GU CRFRenal disease     Musculoskeletal  (+) Arthritis ,    Abdominal   Peds  Hematology   Anesthesia Other Findings   Reproductive/Obstetrics                             Anesthesia Physical Anesthesia Plan  ASA: 3  Anesthesia Plan: Regional   Post-op Pain Management:    Induction:   PONV Risk Score and Plan: 2 and Treatment may vary due to age or medical condition  Airway Management Planned: Nasal Cannula and Natural Airway  Additional Equipment: None  Intra-op Plan:   Post-operative Plan:   Informed Consent: I have reviewed the patients History and Physical, chart, labs and discussed the procedure including the risks, benefits and alternatives for the proposed anesthesia with the patient or authorized representative who has indicated his/her understanding and acceptance.     Consent reviewed with POA  Plan Discussed with: Anesthesiologist  Anesthesia Plan Comments: (Nerve block for hip fracture, plan for OR tomorrow)        Anesthesia Quick Evaluation

## 2022-12-09 NOTE — Anesthesia Procedure Notes (Addendum)
Anesthesia Regional Block: Femoral nerve block   Pre-Anesthetic Checklist: , timeout performed,  Correct Patient, Correct Site, Correct Laterality,  Correct Procedure, Correct Position, site marked,  Risks and benefits discussed,  Surgical consent,  Pre-op evaluation,  At surgeon's request and post-op pain management  Laterality: Right  Prep: chloraprep       Needles:  Injection technique: Single-shot  Needle Type: Echogenic Needle     Needle Length: 10cm  Needle Gauge: 21     Additional Needles:   Narrative:  Start time: 12/09/2022 12:35 PM End time: 12/09/2022 12:38 PM Injection made incrementally with aspirations every 5 mL.  Performed by: Personally  Anesthesiologist: Beryle Lathe, MD  Additional Notes: No pain on injection. No increased resistance to injection. Injection made in 5cc increments. Good needle visualization. Patient tolerated the procedure well.

## 2022-12-09 NOTE — Anesthesia Preprocedure Evaluation (Signed)
Anesthesia Evaluation  Patient identified by MRN, date of birth, ID band Patient awake    Reviewed: Allergy & Precautions, NPO status , Patient's Chart, lab work & pertinent test results  History of Anesthesia Complications Negative for: history of anesthetic complications  Airway Mallampati: III  TM Distance: >3 FB Neck ROM: Full    Dental no notable dental hx.    Pulmonary neg pulmonary ROS   Pulmonary exam normal        Cardiovascular hypertension, +CHF  Normal cardiovascular exam+ pacemaker      Neuro/Psych  PSYCHIATRIC DISORDERS Anxiety    Dementia  Parkinson's dementia     GI/Hepatic Neg liver ROS, hiatal hernia,GERD  Medicated and Controlled,,  Endo/Other  negative endocrine ROS    Renal/GU CRFRenal disease     Musculoskeletal  (+) Arthritis ,    Abdominal   Peds  Hematology  (+) Blood dyscrasia, anemia   Anesthesia Other Findings   Reproductive/Obstetrics                             Anesthesia Physical Anesthesia Plan  ASA: 3  Anesthesia Plan: Spinal   Post-op Pain Management:    Induction: Intravenous  PONV Risk Score and Plan: 2 and Ondansetron, Dexamethasone, Propofol infusion and Treatment may vary due to age or medical condition  Airway Management Planned: Simple Face Mask  Additional Equipment:   Intra-op Plan:   Post-operative Plan:   Informed Consent: I have reviewed the patients History and Physical, chart, labs and discussed the procedure including the risks, benefits and alternatives for the proposed anesthesia with the patient or authorized representative who has indicated his/her understanding and acceptance.   Patient has DNR.  Discussed DNR with power of attorney and Suspend DNR.   Dental advisory given  Plan Discussed with: CRNA  Anesthesia Plan Comments:         Anesthesia Quick Evaluation

## 2022-12-10 ENCOUNTER — Telehealth: Payer: Self-pay | Admitting: Neurology

## 2022-12-10 ENCOUNTER — Encounter (HOSPITAL_COMMUNITY): Payer: Self-pay | Admitting: Internal Medicine

## 2022-12-10 ENCOUNTER — Other Ambulatory Visit: Payer: Self-pay

## 2022-12-10 ENCOUNTER — Inpatient Hospital Stay (HOSPITAL_COMMUNITY)

## 2022-12-10 ENCOUNTER — Inpatient Hospital Stay (HOSPITAL_COMMUNITY): Admitting: Anesthesiology

## 2022-12-10 ENCOUNTER — Encounter (HOSPITAL_COMMUNITY)
Admission: EM | Disposition: A | Discharge status: Home / Self Care | Payer: Self-pay | Source: Home / Self Care | Attending: Internal Medicine

## 2022-12-10 DIAGNOSIS — S72001A Fracture of unspecified part of neck of right femur, initial encounter for closed fracture: Secondary | ICD-10-CM | POA: Diagnosis not present

## 2022-12-10 DIAGNOSIS — I130 Hypertensive heart and chronic kidney disease with heart failure and stage 1 through stage 4 chronic kidney disease, or unspecified chronic kidney disease: Secondary | ICD-10-CM

## 2022-12-10 DIAGNOSIS — N189 Chronic kidney disease, unspecified: Secondary | ICD-10-CM

## 2022-12-10 DIAGNOSIS — D631 Anemia in chronic kidney disease: Secondary | ICD-10-CM

## 2022-12-10 DIAGNOSIS — I509 Heart failure, unspecified: Secondary | ICD-10-CM

## 2022-12-10 HISTORY — PX: TOTAL HIP ARTHROPLASTY: SHX124

## 2022-12-10 LAB — TYPE AND SCREEN: Antibody Screen: NEGATIVE

## 2022-12-10 LAB — URINE CULTURE

## 2022-12-10 LAB — BPAM RBC: Unit Type and Rh: 6200

## 2022-12-10 LAB — CBC
HCT: 33.8 % — ABNORMAL LOW (ref 36.0–46.0)
Hemoglobin: 11 g/dL — ABNORMAL LOW (ref 12.0–15.0)
MCH: 31.3 pg (ref 26.0–34.0)
MCHC: 32.5 g/dL (ref 30.0–36.0)
MCV: 96.3 fL (ref 80.0–100.0)
Platelets: 268 10*3/uL (ref 150–400)
RBC: 3.51 MIL/uL — ABNORMAL LOW (ref 3.87–5.11)
RDW: 13.9 % (ref 11.5–15.5)
WBC: 9.9 10*3/uL (ref 4.0–10.5)
nRBC: 0 % (ref 0.0–0.2)

## 2022-12-10 LAB — ABO/RH: ABO/RH(D): A POS

## 2022-12-10 LAB — BASIC METABOLIC PANEL
Anion gap: 7 (ref 5–15)
BUN: 17 mg/dL (ref 8–23)
CO2: 23 mmol/L (ref 22–32)
Calcium: 8.3 mg/dL — ABNORMAL LOW (ref 8.9–10.3)
Chloride: 105 mmol/L (ref 98–111)
Creatinine, Ser: 0.8 mg/dL (ref 0.44–1.00)
GFR, Estimated: >60 mL/min (ref >60)
Glucose, Bld: 110 mg/dL — ABNORMAL HIGH (ref 70–99)
Potassium: 4 mmol/L (ref 3.5–5.1)
Sodium: 135 mmol/L (ref 135–145)

## 2022-12-10 LAB — PREPARE RBC (CROSSMATCH)

## 2022-12-10 LAB — MAGNESIUM: Magnesium: 1.8 mg/dL (ref 1.7–2.4)

## 2022-12-10 SURGERY — ARTHROPLASTY, HIP, TOTAL, ANTERIOR APPROACH
Anesthesia: Spinal | Site: Hip | Laterality: Right

## 2022-12-10 MED ORDER — HYDROCODONE-ACETAMINOPHEN 7.5-325 MG PO TABS: 1.0000 | ORAL_TABLET | ORAL | Status: DC | PRN

## 2022-12-10 MED ORDER — DOCUSATE SODIUM 100 MG PO CAPS
100.0000 mg | ORAL_CAPSULE | Freq: Two times a day (BID) | ORAL | Status: DC
  Administered 2022-12-10 – 2022-12-12 (×4): 100 mg via ORAL
  Filled 2022-12-10 (×4): qty 1

## 2022-12-10 MED ORDER — TRANEXAMIC ACID-NACL 1000-0.7 MG/100ML-% IV SOLN
1000.0000 mg | INTRAVENOUS | Status: DC
  Filled 2022-12-10: qty 100

## 2022-12-10 MED ORDER — SODIUM CHLORIDE 0.9% IV SOLUTION: Freq: Once | INTRAVENOUS | Status: DC

## 2022-12-10 MED ORDER — ONDANSETRON HCL 4 MG PO TABS
4.0000 mg | ORAL_TABLET | Freq: Four times a day (QID) | ORAL | Status: DC | PRN
  Administered 2022-12-11: 4 mg via ORAL
  Filled 2022-12-10: qty 1

## 2022-12-10 MED ORDER — AMISULPRIDE (ANTIEMETIC) 5 MG/2ML IV SOLN: 10.0000 mg | Freq: Once | INTRAVENOUS | Status: DC | PRN

## 2022-12-10 MED ORDER — PROPOFOL 10 MG/ML IV BOLUS
INTRAVENOUS | Status: DC | PRN
  Administered 2022-12-10: 20 mg via INTRAVENOUS
  Administered 2022-12-10 (×2): 40 mg via INTRAVENOUS
  Administered 2022-12-10: 20 mg via INTRAVENOUS
  Administered 2022-12-10 (×2): 30 mg via INTRAVENOUS
  Administered 2022-12-10: 20 mg via INTRAVENOUS

## 2022-12-10 MED ORDER — FENTANYL CITRATE PF 50 MCG/ML IJ SOSY: 25.0000 ug | PREFILLED_SYRINGE | INTRAMUSCULAR | Status: DC | PRN

## 2022-12-10 MED ORDER — BUPIVACAINE-EPINEPHRINE 0.25% -1:200000 IJ SOLN
INTRAMUSCULAR | Status: DC | PRN
  Administered 2022-12-10: 30 mL

## 2022-12-10 MED ORDER — ACETAMINOPHEN 500 MG PO TABS: 1000.0000 mg | ORAL_TABLET | Freq: Once | ORAL | Status: DC

## 2022-12-10 MED ORDER — ACETAMINOPHEN 10 MG/ML IV SOLN: 1000.0000 mg | Freq: Once | INTRAVENOUS | Status: DC | PRN

## 2022-12-10 MED ORDER — PROPOFOL 10 MG/ML IV BOLUS
INTRAVENOUS | Status: AC
  Filled 2022-12-10: qty 20

## 2022-12-10 MED ORDER — METOCLOPRAMIDE HCL 5 MG PO TABS: 5.0000 mg | ORAL_TABLET | Freq: Three times a day (TID) | ORAL | Status: DC | PRN

## 2022-12-10 MED ORDER — POLYETHYLENE GLYCOL 3350 17 G PO PACK
17.0000 g | PACK | Freq: Every day | ORAL | Status: DC | PRN
  Administered 2022-12-12: 17 g via ORAL
  Filled 2022-12-10: qty 1

## 2022-12-10 MED ORDER — SODIUM CHLORIDE 0.9 % IR SOLN
Status: DC | PRN
  Administered 2022-12-10: 3000 mL

## 2022-12-10 MED ORDER — DIPHENHYDRAMINE HCL 12.5 MG/5ML PO ELIX: 12.5000 mg | ORAL_SOLUTION | ORAL | Status: DC | PRN

## 2022-12-10 MED ORDER — ONDANSETRON HCL 4 MG/2ML IJ SOLN
INTRAMUSCULAR | Status: AC
  Filled 2022-12-10: qty 2

## 2022-12-10 MED ORDER — PHENYLEPHRINE HCL-NACL 20-0.9 MG/250ML-% IV SOLN
INTRAVENOUS | Status: DC | PRN
  Administered 2022-12-10: 25 ug/min via INTRAVENOUS

## 2022-12-10 MED ORDER — SODIUM CHLORIDE 0.9 % IV SOLN: INTRAVENOUS | Status: AC

## 2022-12-10 MED ORDER — ACETAMINOPHEN 325 MG PO TABS
325.0000 mg | ORAL_TABLET | Freq: Four times a day (QID) | ORAL | Status: DC | PRN
  Administered 2022-12-11: 650 mg via ORAL
  Filled 2022-12-10: qty 2

## 2022-12-10 MED ORDER — SODIUM CHLORIDE (PF) 0.9 % IJ SOLN
INTRAMUSCULAR | Status: AC
  Filled 2022-12-10: qty 50

## 2022-12-10 MED ORDER — SODIUM CHLORIDE 0.9 % IV SOLN
2.0000 g | Freq: Two times a day (BID) | INTRAVENOUS | Status: DC
  Administered 2022-12-10 – 2022-12-11 (×2): 2 g via INTRAVENOUS
  Filled 2022-12-10 (×2): qty 12.5

## 2022-12-10 MED ORDER — FENTANYL CITRATE (PF) 100 MCG/2ML IJ SOLN
INTRAMUSCULAR | Status: DC | PRN
  Administered 2022-12-10 (×4): 25 ug via INTRAVENOUS

## 2022-12-10 MED ORDER — 0.9 % SODIUM CHLORIDE (POUR BTL) OPTIME
TOPICAL | Status: DC | PRN
  Administered 2022-12-10: 1000 mL

## 2022-12-10 MED ORDER — MORPHINE SULFATE (PF) 2 MG/ML IV SOLN: 0.5000 mg | INTRAVENOUS | Status: DC | PRN

## 2022-12-10 MED ORDER — MENTHOL 3 MG MT LOZG: 1.0000 | LOZENGE | OROMUCOSAL | Status: DC | PRN

## 2022-12-10 MED ORDER — CEFAZOLIN SODIUM-DEXTROSE 2-4 GM/100ML-% IV SOLN
2.0000 g | INTRAVENOUS | Status: AC
  Administered 2022-12-10: 2 g via INTRAVENOUS
  Filled 2022-12-10: qty 100

## 2022-12-10 MED ORDER — SENNA 8.6 MG PO TABS
1.0000 | ORAL_TABLET | Freq: Two times a day (BID) | ORAL | Status: DC
  Administered 2022-12-10 – 2022-12-12 (×4): 8.6 mg via ORAL
  Filled 2022-12-10 (×4): qty 1

## 2022-12-10 MED ORDER — KETOROLAC TROMETHAMINE 30 MG/ML IJ SOLN
INTRAMUSCULAR | Status: DC | PRN
  Administered 2022-12-10: 30 mg

## 2022-12-10 MED ORDER — ONDANSETRON HCL 4 MG/2ML IJ SOLN: 4.0000 mg | Freq: Once | INTRAMUSCULAR | Status: DC | PRN

## 2022-12-10 MED ORDER — ALUM & MAG HYDROXIDE-SIMETH 200-200-20 MG/5ML PO SUSP: 30.0000 mL | ORAL | Status: DC | PRN

## 2022-12-10 MED ORDER — EPINEPHRINE PF 1 MG/ML IJ SOLN
INTRAMUSCULAR | Status: AC
  Filled 2022-12-10: qty 1

## 2022-12-10 MED ORDER — POVIDONE-IODINE 10 % EX SWAB: 2.0000 | Freq: Once | CUTANEOUS | Status: DC

## 2022-12-10 MED ORDER — PHENOL 1.4 % MT LIQD: 1.0000 | OROMUCOSAL | Status: DC | PRN

## 2022-12-10 MED ORDER — FENTANYL CITRATE (PF) 100 MCG/2ML IJ SOLN
INTRAMUSCULAR | Status: AC
  Filled 2022-12-10: qty 2

## 2022-12-10 MED ORDER — METOCLOPRAMIDE HCL 5 MG/ML IJ SOLN: 5.0000 mg | Freq: Three times a day (TID) | INTRAMUSCULAR | Status: DC | PRN

## 2022-12-10 MED ORDER — SODIUM CHLORIDE 0.9 % IV SOLN: INTRAVENOUS | Status: DC

## 2022-12-10 MED ORDER — SODIUM CHLORIDE 0.9 % IR SOLN
Status: DC | PRN
  Administered 2022-12-10: 1000 mL

## 2022-12-10 MED ORDER — ISOPROPYL ALCOHOL 70 % SOLN
Status: DC | PRN
  Administered 2022-12-10: 1 via TOPICAL

## 2022-12-10 MED ORDER — BUPIVACAINE HCL 0.25 % IJ SOLN
INTRAMUSCULAR | Status: AC
  Filled 2022-12-10: qty 1

## 2022-12-10 MED ORDER — HYDROCODONE-ACETAMINOPHEN 5-325 MG PO TABS
1.0000 | ORAL_TABLET | ORAL | Status: DC | PRN
  Administered 2022-12-10 – 2022-12-11 (×3): 1 via ORAL
  Filled 2022-12-10 (×3): qty 1

## 2022-12-10 MED ORDER — WATER FOR IRRIGATION, STERILE IR SOLN
Status: DC | PRN
  Administered 2022-12-10: 2000 mL

## 2022-12-10 MED ORDER — BUPIVACAINE IN DEXTROSE 0.75-8.25 % IT SOLN
INTRATHECAL | Status: DC | PRN
  Administered 2022-12-10: 1.8 mL via INTRATHECAL

## 2022-12-10 MED ORDER — KETOROLAC TROMETHAMINE 30 MG/ML IJ SOLN
INTRAMUSCULAR | Status: AC
  Filled 2022-12-10: qty 1

## 2022-12-10 MED ORDER — PROPOFOL 500 MG/50ML IV EMUL
INTRAVENOUS | Status: AC
  Filled 2022-12-10: qty 50

## 2022-12-10 MED ORDER — LACTATED RINGERS IV SOLN: INTRAVENOUS | Status: DC | PRN

## 2022-12-10 MED ORDER — SODIUM CHLORIDE (PF) 0.9 % IJ SOLN
INTRAMUSCULAR | Status: DC | PRN
  Administered 2022-12-10: 30 mL

## 2022-12-10 MED ORDER — ONDANSETRON HCL 4 MG/2ML IJ SOLN: 4.0000 mg | Freq: Four times a day (QID) | INTRAMUSCULAR | Status: DC | PRN

## 2022-12-10 MED ORDER — CHLORHEXIDINE GLUCONATE 4 % EX SOLN: 60.0000 mL | Freq: Once | CUTANEOUS | Status: DC

## 2022-12-10 MED ORDER — PROPOFOL 500 MG/50ML IV EMUL
INTRAVENOUS | Status: DC | PRN
  Administered 2022-12-10: 100 ug/kg/min via INTRAVENOUS

## 2022-12-10 MED ORDER — ASPIRIN 81 MG PO CHEW
81.0000 mg | CHEWABLE_TABLET | Freq: Two times a day (BID) | ORAL | Status: DC
  Administered 2022-12-10 – 2022-12-12 (×4): 81 mg via ORAL
  Filled 2022-12-10 (×4): qty 1

## 2022-12-10 MED ORDER — PHENYLEPHRINE 80 MCG/ML (10ML) SYRINGE FOR IV PUSH (FOR BLOOD PRESSURE SUPPORT)
PREFILLED_SYRINGE | INTRAVENOUS | Status: DC | PRN
  Administered 2022-12-10 (×2): 160 ug via INTRAVENOUS
  Administered 2022-12-10 (×2): 80 ug via INTRAVENOUS

## 2022-12-10 MED ORDER — DEXAMETHASONE SODIUM PHOSPHATE 10 MG/ML IJ SOLN
INTRAMUSCULAR | Status: AC
  Filled 2022-12-10: qty 1

## 2022-12-10 MED ORDER — CEFAZOLIN SODIUM-DEXTROSE 2-4 GM/100ML-% IV SOLN: 2.0000 g | Freq: Four times a day (QID) | INTRAVENOUS | Status: DC

## 2022-12-10 SURGICAL SUPPLY — 58 items
ADH SKN CLS APL DERMABOND .7 (GAUZE/BANDAGES/DRESSINGS) ×1
APL PRP STRL LF DISP 70% ISPRP (MISCELLANEOUS) ×1
BAG COUNTER SPONGE SURGICOUNT (BAG) IMPLANT
BAG DECANTER FOR FLEXI CONT (MISCELLANEOUS) IMPLANT
BAG SPEC THK2 15X12 ZIP CLS (MISCELLANEOUS)
BAG SPNG CNTER NS LX DISP (BAG)
BAG ZIPLOCK 12X15 (MISCELLANEOUS) IMPLANT
CHLORAPREP W/TINT 26 (MISCELLANEOUS) ×1 IMPLANT
COVER PERINEAL POST (MISCELLANEOUS) ×1 IMPLANT
COVER SURGICAL LIGHT HANDLE (MISCELLANEOUS) ×1 IMPLANT
DERMABOND ADVANCED .7 DNX12 (GAUZE/BANDAGES/DRESSINGS) ×2 IMPLANT
DRAPE IMP U-DRAPE 54X76 (DRAPES) ×1 IMPLANT
DRAPE SHEET LG 3/4 BI-LAMINATE (DRAPES) ×3 IMPLANT
DRAPE STERI IOBAN 125X83 (DRAPES) ×1 IMPLANT
DRAPE U-SHAPE 47X51 STRL (DRAPES) ×1 IMPLANT
DRSG AQUACEL AG ADV 3.5X10 (GAUZE/BANDAGES/DRESSINGS) ×1 IMPLANT
ELECT REM PT RETURN 15FT ADLT (MISCELLANEOUS) ×1 IMPLANT
GAUZE SPONGE 4X4 12PLY STRL (GAUZE/BANDAGES/DRESSINGS) ×1 IMPLANT
GLOVE BIO SURGEON STRL SZ7 (GLOVE) ×1 IMPLANT
GLOVE BIO SURGEON STRL SZ8.5 (GLOVE) ×2 IMPLANT
GLOVE BIOGEL PI IND STRL 7.5 (GLOVE) ×1 IMPLANT
GLOVE BIOGEL PI IND STRL 8.5 (GLOVE) ×1 IMPLANT
GOWN SPEC L3 XXLG W/TWL (GOWN DISPOSABLE) ×1 IMPLANT
GOWN STRL REUS W/ TWL XL LVL3 (GOWN DISPOSABLE) ×1 IMPLANT
GOWN STRL REUS W/TWL XL LVL3 (GOWN DISPOSABLE) ×1
HANDPIECE INTERPULSE COAX TIP (DISPOSABLE) ×1
HEAD FEM -3XOFST 36XMDLR (Head) IMPLANT
HEAD MODULAR 36MM (Head) ×1 IMPLANT
HOLDER FOLEY CATH W/STRAP (MISCELLANEOUS) ×1 IMPLANT
HOOD PEEL AWAY T7 (MISCELLANEOUS) ×3 IMPLANT
KIT TURNOVER KIT A (KITS) IMPLANT
LINER G7 VIT E +5 36 D (Liner) IMPLANT
MANIFOLD NEPTUNE II (INSTRUMENTS) ×1 IMPLANT
MARKER SKIN DUAL TIP RULER LAB (MISCELLANEOUS) ×1 IMPLANT
NDL SAFETY ECLIP 18X1.5 (MISCELLANEOUS) ×1 IMPLANT
NDL SPNL 18GX3.5 QUINCKE PK (NEEDLE) ×1 IMPLANT
NEEDLE SPNL 18GX3.5 QUINCKE PK (NEEDLE) ×1 IMPLANT
PACK ANTERIOR HIP CUSTOM (KITS) ×1 IMPLANT
SAW OSC TIP CART 19.5X105X1.3 (SAW) ×1 IMPLANT
SEALER BIPOLAR AQUA 6.0 (INSTRUMENTS) ×1 IMPLANT
SET HNDPC FAN SPRY TIP SCT (DISPOSABLE) ×1 IMPLANT
SHELL ACET G7 3H 50 SZD (Shell) IMPLANT
SOLUTION PRONTOSAN WOUND 350ML (IRRIGATION / IRRIGATOR) ×1 IMPLANT
SPIKE FLUID TRANSFER (MISCELLANEOUS) ×1 IMPLANT
STAPLER VISISTAT 35W (STAPLE) IMPLANT
STEM FEM CMTL 15X150 133D (Stem) IMPLANT
SUT MNCRL AB 3-0 PS2 18 (SUTURE) ×1 IMPLANT
SUT MON AB 2-0 CT1 36 (SUTURE) ×1 IMPLANT
SUT STRATAFIX 14 PDO 48 VLT (SUTURE) IMPLANT
SUT STRATAFIX PDO 1 14 VIOLET (SUTURE) ×2
SUT STRATFX PDO 1 14 VIOLET (SUTURE) ×1
SUT VIC AB 2-0 CT1 27 (SUTURE)
SUT VIC AB 2-0 CT1 TAPERPNT 27 (SUTURE) IMPLANT
SUTURE STRATFX PDO 1 14 VIOLET (SUTURE) ×1 IMPLANT
SYR 3ML LL SCALE MARK (SYRINGE) ×1 IMPLANT
TRAY FOLEY MTR SLVR 16FR STAT (SET/KITS/TRAYS/PACK) IMPLANT
TUBE SUCTION HIGH CAP CLEAR NV (SUCTIONS) ×1 IMPLANT
WATER STERILE IRR 1000ML POUR (IV SOLUTION) ×1 IMPLANT

## 2022-12-10 NOTE — Progress Notes (Signed)
PROGRESS NOTE    [name]  ZOX:[id number] DOB: [birth year] DOA: 12/08/2022 PCP: Pcp, No    Brief Narrative:   [name] is a 88 y.o. female with past medical history significant for dementia, parkinsonism, essential hypertension, sinus node dysfunction s/p PPM, diastolic congestive heart failure, CKD stage IV, GAD/depression, hyperlipidemia, GERD who presented to [facility] ED on 5/13 from her facility [facility] [place] via EMS after she was found on the floor of her room at her facility.  Workup in the ED with noted right hip fracture and urinalysis concerning for UTI.  CT head/C-spine unrevealing.  Orthopedics was consulted and TR[facility] consulted for admission for further evaluation management.  Assessment & Plan:   Right hip fracture Patient was found on the floor at her facility.  Complaining of pain.  Right hip/pelvis x-ray with acute fracture of the right femoral neck. -- Orthopedics following, appreciate assistance -- Oxycodone 5 mg p.o. every 4 hours as needed moderate pain -- Morphine 1 mg IV every 2 hours as needed severe pain -- Robaxin 500 mg p.o. every 6 hours as needed muscle spasm -- N.p.o., orthopedics plans operative management this afternoon -- NS at 75 mL/h -- Plan to return to [facility] when stable for discharge  GNR Urinary tract infection Urinalysis with no leukocytes, positive nitrite, many bacteria, 0-5 WBCs. -- Urine culture: > 100K GNR -- Ceftriaxone 1 g IV every 24 hours  Essential hypertension Sinus node dysfunction s/p PPM Chronic diastolic congestive heart failure, compensated Follows with cardiology outpatient.  EKG on admission with sinus tachycardia, rate 107. -- Not on any antihypertensives or rate controlling medications at baseline -- Hydralazine 5 mg IV q6h PRN SBP >165  CKD stage IV Creatinine 0.75, stable  GAD/depression -- Lexapro 10 mg p.o. daily -- Lorazepam 0.5 mg p.o. 3 times daily as needed  anxiety  Hyperlipidemia -- Currently not on any medication outpatient  GERD -- Protonix 40 mg p.o. twice daily substituted for home omeprazole  Dementia, parkinsonism Follows with neurology outpatient, Dr. Arbutus Leas.  Also hospice outpatient.   DVT prophylaxis: SCDs Start: 12/08/22 1324    Code Status: DNR Family Communication: No family present at bedside this morning; updated patient's daughter Vickie via telephone this morning  Disposition Plan:  Level of care: Med-Surg Status is: Inpatient Remains inpatient appropriate because: Pending operative management for hip fracture    Consultants:  Ortho, Dr. Linna Caprice  Procedures:  None  Antimicrobials:  Ceftriaxone 5/14>>   Subjective: Patient seen examined bedside, resting comfortably.  Lying in bed.  No family present.  Remains pleasantly confused.  Does not appear to be in significant pain at this time. Unable to obtain any further ROS from the patient.  Seen by orthopedics with plan for operative management this afternoon.  No acute concerns overnight per nursing staff.  Updated daughter via telephone this morning  Objective: Vitals:   12/09/22 1330 12/09/22 1342 12/09/22 2059 12/10/22 0503  BP: (!) 106/50 (!) 110/53 (!) 171/68 (!) 155/63  Pulse: 70 74 76 73  Resp: 16 17 17 17   Temp:   98 F (36.7 C) 98.1 F (36.7 C)  TempSrc:   Oral   SpO2: 96% 95% 96% 97%  Weight:      Height:        Intake/Output Summary (Last 24 hours) at 12/10/2022 0959 Last data filed at 12/09/2022 2200 Gross per 24 hour  Intake 100.31 ml  Output 300 ml  Net -199.69 ml  Filed Weights   12/08/22 0950  Weight: 66.7 kg    Examination:  Physical Exam: GEN: NAD, alert, pleasant confused, chronically ill/elderly in appearance HEENT: NCAT, PERRL, EOMI, sclera clear, MMM PULM: CTAB w/o wheezes/crackles, normal respiratory effort, on room air CV: RRR w/ + SEM LUSB, no gallop/rub; left chest PPM generator appreciated GI: abd soft, NTND, +  BS MSK: no peripheral edema, moves all extremities independently, right lower extremity slightly shortened with external rotation Integumentary: No concerning rashes/lesions/wounds noted on exposed skin surfaces    Data Reviewed: I have personally reviewed following labs and imaging studies  CBC: Recent Labs  Lab 12/08/22 1010 12/09/22 0350 12/10/22 0335  WBC 17.5* 10.2 9.9  NEUTROABS 15.5*  --   --   HGB 12.0 10.8* 11.0*  HCT 36.5 32.9* 33.8*  MCV 98.1 97.1 96.3  PLT 310 263 268   Basic Metabolic Panel: Recent Labs  Lab 12/08/22 1010 12/09/22 0350 12/10/22 0335  NA 138 136 135  K 3.7 3.4* 4.0  CL 104 105 105  CO2 23 22 23   GLUCOSE 117* 110* 110*  BUN 22 16 17   CREATININE 0.95 0.75 0.80  CALCIUM 9.2 8.5* 8.3*  MG  --   --  1.8   GFR: Estimated Creatinine Clearance: 45 mL/min (by C-G formula based on SCr of 0.8 mg/dL). Liver Function Tests: No results for input(s): "AST", "ALT", "ALKPHOS", "BILITOT", "PROT", "ALBUMIN" in the last 168 hours. No results for input(s): "LIPASE", "AMYLASE" in the last 168 hours. No results for input(s): "AMMONIA" in the last 168 hours. Coagulation Profile: Recent Labs  Lab 12/08/22 1010  INR 0.9   Cardiac Enzymes: No results for input(s): "CKTOTAL", "CKMB", "CKMBINDEX", "TROPONINI" in the last 168 hours. BNP (last 3 results) No results for input(s): "PROBNP" in the last 8760 hours. HbA1C: No results for input(s): "HGBA1C" in the last 72 hours. CBG: Recent Labs  Lab 12/08/22 0954  GLUCAP 111*   Lipid Profile: No results for input(s): "CHOL", "HDL", "LDLCALC", "TRIG", "CHOLHDL", "LDLDIRECT" in the last 72 hours. Thyroid Function Tests: No results for input(s): "TSH", "T4TOTAL", "FREET4", "T3FREE", "THYROIDAB" in the last 72 hours. Anemia Panel: No results for input(s): "VITAMINB12", "FOLATE", "FERRITIN", "TIBC", "IRON", "RETICCTPCT" in the last 72 hours. Sepsis Labs: No results for input(s): "PROCALCITON", "LATICACIDVEN" in  the last 168 hours.  Recent Results (from the past 240 hour(s))  Urine Culture (for pregnant, neutropenic or urologic patients or patients with an indwelling urinary catheter)     Status: Abnormal (Preliminary result)   Collection Time: 12/08/22  1:45 PM   Specimen: Urine, Clean Catch  Result Value Ref Range Status   Specimen Description   Final    URINE, CLEAN CATCH Performed at [facility], [address] [place], [place] [address]    Special Requests   Final    NONE Performed at [facility], [address], [place], [place] [address]    Culture >=100,000 COLONIES/mL GRAM NEGATIVE RODS (A)  Final   Report Status PENDING  Incomplete  Surgical pcr screen     Status: None   Collection Time: 12/09/22  3:12 PM   Specimen: Nasal Mucosa; Nasal Swab  Result Value Ref Range Status   MRSA, PCR NEGATIVE NEGATIVE Final   Staphylococcus aureus NEGATIVE NEGATIVE Final    Comment: (NOTE) The Xpert SA Assay (FDA approved for NASAL specimens in patients 21 years of age and older), is one component of a comprehensive surveillance program. It is not intended to  diagnose infection nor to guide or monitor treatment. Performed at [facility], [address] [place], [place] [address]          Radiology Studies: DG Knee Right Port  Result Date: 12/09/2022 CLINICAL DATA:  Closed displaced fracture of right femoral neck. EXAM: PORTABLE RIGHT KNEE - 1-2 VIEW COMPARISON:  Frontal view of the bilateral knees 01/15/2021, right knee radiographs 08/03/2019 FINDINGS: There is minimal medial compartment joint space narrowing. Mild peripheral medial tibial plateau degenerative osteophytosis. No joint effusion. Unchanged cortical thickening that may represent a possible old healed fracture versus chronic stress changes of the proximal diaphysis of the fibula, unchanged from prior. Mild benign-appearing chronic periosteal reaction at the medial  aspect of the proximal tibial metadiaphysis is slightly increased from prior and may represent the sequela of chronic stress changes and/or remote trauma. No acute fracture or dislocation. IMPRESSION: 1. Minimal medial compartment osteoarthritis. 2. No acute fracture. Electronically Signed   By: Neita Garnet M.D.   On: 12/09/2022 08:46   CT HEAD WO CONTRAST ( )  Result Date: 12/08/2022 CLINICAL DATA:  Head trauma, minor (Age >= 65y); Polytrauma, blunt. Found down/fall. Recent headache. EXAM: CT HEAD WITHOUT CONTRAST CT CERVICAL SPINE WITHOUT CONTRAST TECHNIQUE: Multidetector CT imaging of the head and cervical spine was performed following the standard protocol without intravenous contrast. Multiplanar CT image reconstructions of the cervical spine were also generated. RADIATION DOSE REDUCTION: This exam was performed according to the departmental dose-optimization program which includes automated exposure control, adjustment of the mA and/or kV according to patient size and/or use of iterative reconstruction technique. COMPARISON:  CT head, cervical spine, and thoracic spine 05/17/2022 FINDINGS: CT HEAD FINDINGS Brain: There is no evidence of an acute infarct, intracranial hemorrhage, mass, midline shift, or extra-axial fluid collection. Mild cerebral atrophy is within normal limits for age. Cerebral white matter hypodensities are unchanged and nonspecific but compatible with mild chronic small vessel ischemic disease. Vascular: Calcified atherosclerosis at the skull base. No hyperdense vessel. Skull: No acute fracture or suspicious osseous lesion. Sinuses/Orbits: Paranasal sinuses and mastoid air cells are clear. Bilateral cataract extraction. Other: None. CT CERVICAL SPINE FINDINGS Alignment: Chronic trace retrolisthesis of C3 on C4 and trace anterolisthesis of C7 on T1. Skull base and vertebrae: No acute fracture or suspicious osseous lesion. Mild chronic C7 inferior and T2 superior endplate compression  fractures, also present on the comparison studies. Solid C5-6 ACDF. Soft tissues and spinal canal: No prevertebral fluid or swelling. No visible canal hematoma. Disc levels: Mild to moderate disc degeneration at C6-7. No evidence of high-grade stenosis. Upper chest: No mass or consolidation in the included lung apices. Other: Mild calcified atherosclerosis at the carotid bifurcations. IMPRESSION: 1. No evidence of acute intracranial abnormality. 2. No acute cervical spine fracture. Electronically Signed   By: Sebastian Ache M.D.   On: 12/08/2022 11:08   CT CERVICAL SPINE WO CONTRAST  Result Date: 12/08/2022 CLINICAL DATA:  Head trauma, minor (Age >= 65y); Polytrauma, blunt. Found down/fall. Recent headache. EXAM: CT HEAD WITHOUT CONTRAST CT CERVICAL SPINE WITHOUT CONTRAST TECHNIQUE: Multidetector CT imaging of the head and cervical spine was performed following the standard protocol without intravenous contrast. Multiplanar CT image reconstructions of the cervical spine were also generated. RADIATION DOSE REDUCTION: This exam was performed according to the departmental dose-optimization program which includes automated exposure control, adjustment of the mA and/or kV according to patient size and/or use of iterative reconstruction technique. COMPARISON:  CT head, cervical spine, and thoracic spine 05/17/2022  FINDINGS: CT HEAD FINDINGS Brain: There is no evidence of an acute infarct, intracranial hemorrhage, mass, midline shift, or extra-axial fluid collection. Mild cerebral atrophy is within normal limits for age. Cerebral white matter hypodensities are unchanged and nonspecific but compatible with mild chronic small vessel ischemic disease. Vascular: Calcified atherosclerosis at the skull base. No hyperdense vessel. Skull: No acute fracture or suspicious osseous lesion. Sinuses/Orbits: Paranasal sinuses and mastoid air cells are clear. Bilateral cataract extraction. Other: None. CT CERVICAL SPINE FINDINGS  Alignment: Chronic trace retrolisthesis of C3 on C4 and trace anterolisthesis of C7 on T1. Skull base and vertebrae: No acute fracture or suspicious osseous lesion. Mild chronic C7 inferior and T2 superior endplate compression fractures, also present on the comparison studies. Solid C5-6 ACDF. Soft tissues and spinal canal: No prevertebral fluid or swelling. No visible canal hematoma. Disc levels: Mild to moderate disc degeneration at C6-7. No evidence of high-grade stenosis. Upper chest: No mass or consolidation in the included lung apices. Other: Mild calcified atherosclerosis at the carotid bifurcations. IMPRESSION: 1. No evidence of acute intracranial abnormality. 2. No acute cervical spine fracture. Electronically Signed   By: Sebastian Ache M.D.   On: 12/08/2022 11:08   DG Hip Unilat W or Wo Pelvis 2-3 Views Right  Result Date: 12/08/2022 CLINICAL DATA:  Fall EXAM: DG HIP (WITH OR WITHOUT PELVIS) 2-3V RIGHT COMPARISON:  12/16/2013 FINDINGS: Acute fracture of the right femoral neck, which appears predominantly subcapital in location. There is fracture shortening and varus angulation. No evidence of fracture extension to the intertrochanteric region. Hip joint alignment is maintained without dislocation. Mild to moderate diffuse right hip joint space narrowing. IMPRESSION: Acute fracture of the right femoral neck. Electronically Signed   By: Duanne Guess D.O.   On: 12/08/2022 10:34   DG Chest Portable 1 View  Result Date: 12/08/2022 CLINICAL DATA:  Fall EXAM: PORTABLE CHEST 1 VIEW COMPARISON:  05/17/2022 FINDINGS: Right-sided implanted cardiac device remains in place. Stable heart size. Aortic and mitral annulus calcification. No focal airspace consolidation, pleural effusion, or pneumothorax. Bones are demineralized. No acute bony abnormalities are identified. IMPRESSION: No active disease. Electronically Signed   By: Duanne Guess D.O.   On: 12/08/2022 10:33        Scheduled Meds:  sodium  chloride   Intravenous Once   cyanocobalamin  1,000 mcg Oral Daily   escitalopram  10 mg Oral Daily   pantoprazole  40 mg Oral BID AC   Continuous Infusions:  sodium chloride Stopped (12/09/22 1059)   cefTRIAXone (ROCEPHIN)  IV 1 g (12/10/22 0935)     LOS: 2 days    Time spent: 51 minutes spent on chart review, discussion with nursing staff, consultants, updating family and interview/physical exam; more than 50% of that time was spent in counseling and/or coordination of care.    Alvira Philips [place], DO [facility] Available via Epic secure chat 7am-7pm After these hours, please refer to coverage provider listed on [email or web address] 12/10/2022, 9:59 AM

## 2022-12-10 NOTE — Progress Notes (Signed)
Patient refused to dangle.

## 2022-12-10 NOTE — Plan of Care (Signed)
°  Problem: Coping: °Goal: Level of anxiety will decrease °Outcome: Progressing °  °

## 2022-12-10 NOTE — Discharge Instructions (Signed)
? ?Dr. Brian Swinteck ?Joint Replacement Specialist ?[facility] ?[address] ?[place], [place] [address] ?[phone] ? ? ?TOTAL HIP REPLACEMENT POSTOPERATIVE DIRECTIONS ? ? ? ?Hip Rehabilitation, Guidelines Following Surgery  ? ?WEIGHT BEARING ?Weight bearing as tolerated with assist device (walker, cane, etc) as directed, use it as long as suggested by your surgeon or therapist, typically at least 4-6 weeks. ? ?The results of a hip operation are greatly improved after range of motion and muscle strengthening exercises. Follow all safety measures which are given to protect your hip. If any of these exercises cause increased pain or swelling in your joint, decrease the amount until you are comfortable again. Then slowly increase the exercises. Call your caregiver if you have problems or questions.  ? ?HOME CARE INSTRUCTIONS  ?Most of the following instructions are designed to prevent the dislocation of your new hip.  ?Remove items at home which could result in a fall. This includes throw rugs or furniture in walking pathways.  ?Continue medications as instructed at time of discharge. ?You may have some home medications which will be placed on hold until you complete the course of blood thinner medication. ?You may start showering once you are discharged home. Do not remove your dressing. ?Do not put on socks or shoes without following the instructions of your caregivers.   ?Sit on chairs with arms. Use the chair arms to help push yourself up when arising.  ?Arrange for the use of a toilet seat elevator so you are not sitting low.  ?Walk with walker as instructed.  ?You may resume a sexual relationship in one month or when given the OK by your caregiver.  ?Use walker as long as suggested by your caregivers.  ?You may put full weight on your legs and walk as much as is comfortable. ?Avoid periods of inactivity such as sitting longer than an hour when not asleep. This helps prevent blood  clots.  ?You may return to work once you are cleared by your surgeon.  ?Do not drive a car for 6 weeks or until released by your surgeon.  ?Do not drive while taking narcotics.  ?Wear elastic stockings for two weeks following surgery during the day but you may remove then at night.  ?Make sure you keep all of your appointments after your operation with all of your doctors and caregivers. You should call the office at the above phone number and make an appointment for approximately two weeks after the date of your surgery. ?Please pick up a stool softener and laxative for home use as long as you are requiring pain medications. ?ICE to the affected hip every three hours for 30 minutes at a time and then as needed for pain and swelling. Continue to use ice on the hip for pain and swelling from surgery. You may notice swelling that will progress down to the foot and ankle.  This is normal after surgery.  Elevate the leg when you are not up walking on it.   ?It is important for you to complete the blood thinner medication as prescribed by your doctor. ?Continue to use the breathing machine which will help keep your temperature down.  It is common for your temperature to cycle up and down following surgery, especially at night when you are not up moving around and exerting yourself.  The breathing machine keeps your lungs expanded and your temperature down. ? ?RANGE OF MOTION AND STRENGTHENING EXERCISES  ?These exercises are designed to help you   keep full movement of your hip joint. Follow your caregiver's or physical therapist's instructions. Perform all exercises about fifteen times, three times per day or as directed. Exercise both hips, even if you have had only one joint replacement. These exercises can be done on a training (exercise) mat, on the floor, on a table or on a bed. Use whatever works the best and is most comfortable for you. Use music or television while you are exercising so that the exercises are a  pleasant break in your day. This will make your life better with the exercises acting as a break in routine you can look forward to.  ?Lying on your back, slowly slide your foot toward your buttocks, raising your knee up off the floor. Then slowly slide your foot back down until your leg is straight again.  ?Lying on your back spread your legs as far apart as you can without causing discomfort.  ?Lying on your side, raise your upper leg and foot straight up from the floor as far as is comfortable. Slowly lower the leg and repeat.  ?Lying on your back, tighten up the muscle in the front of your thigh (quadriceps muscles). You can do this by keeping your leg straight and trying to raise your heel off the floor. This helps strengthen the largest muscle supporting your knee.  ?Lying on your back, tighten up the muscles of your buttocks both with the legs straight and with the knee bent at a comfortable angle while keeping your heel on the floor.  ? ?SKILLED REHAB INSTRUCTIONS: ?If the patient is transferred to a skilled rehab facility following release from the hospital, a list of the current medications will be sent to the facility for the patient to continue.  When discharged from the skilled rehab facility, please have the facility set up the patient's Home Health Physical Therapy prior to being released. Also, the skilled facility will be responsible for providing the patient with their medications at time of release from the facility to include their pain medication and their blood thinner medication. If the patient is still at the rehab facility at time of the two week follow up appointment, the skilled rehab facility will also need to assist the patient in arranging follow up appointment in our office and any transportation needs. ? ?POST-OPERATIVE OPIOID TAPER INSTRUCTIONS: ?It is important to wean off of your opioid medication as soon as possible. If you do not need pain medication after your surgery it is ok  to stop day one. ?Opioids include: ?Codeine, Hydrocodone(Norco, Vicodin), Oxycodone(Percocet, oxycontin) and hydromorphone amongst others.  ?Long term and even short term use of opiods can cause: ?Increased pain response ?Dependence ?Constipation ?Depression ?Respiratory depression ?And more.  ?Withdrawal symptoms can include ?Flu like symptoms ?Nausea, vomiting ?And more ?Techniques to manage these symptoms ?Hydrate well ?Eat regular healthy meals ?Stay active ?Use relaxation techniques(deep breathing, meditating, yoga) ?Do Not substitute Alcohol to help with tapering ?If you have been on opioids for less than two weeks and do not have pain than it is ok to stop all together.  ?Plan to wean off of opioids ?This plan should start within one week post op of your joint replacement. ?Maintain the same interval or time between taking each dose and first decrease the dose.  ?Cut the total daily intake of opioids by one tablet each day ?Next start to increase the time between doses. ?The last dose that should be eliminated is the evening dose.  ? ? ?MAKE   SURE YOU:  ?Understand these instructions.  ?Will watch your condition.  ?Will get help right away if you are not doing well or get worse. ? ?Pick up stool softner and laxative for home use following surgery while on pain medications. ?Do not remove your dressing. ?The dressing is waterproof--it is OK to take showers. ?Continue to use ice for pain and swelling after surgery. ?Do not use any lotions or creams on the incision until instructed by your surgeon. ?Total Hip Protocol. ? ?

## 2022-12-10 NOTE — Progress Notes (Signed)
[facility]) Hospitalized Hospice patient visit   [name] is a current [facility] hospice patient with a terminal diagnosis of parkinson's disease without dyskinesia and dementia.  Patient has secondary diagnoses of stage IV chronic kidney disease and heart failure. Patient was transported to the hospital 5.13 via EMS after patient was found in the floor of her room at the [facility] [place].  Patient's code status with ACC is DNR.  According to Dr. Patric Dykes with [facility], this is a hospice related admission.  Rounded on patient and daughter at bedside.  Plan is for surgery today tentatively scheduled for 1pm.  Offered emotional support to patient and daughter.  Patient states she just "wants to get it over with" as she is in pain this morning.  Daughter denies any other needs at this time.  ACC liaison will continue to follow for discharge planning post surgery.   Vital Signs- 98.2/71/15    191/58   97% Room Air I&O- 100/300 Abnormal Labs- RBC 3.51, Hgb 11.0, Hct 33.8, Calcium 8.3, Troponin 43, Glucose 110 Diagnostics- pending per OR needs   IV/PRN Meds- Hydralazine, Ativan, Robaxin, Morphine, Zofran, Oxycodone, Miralax, Senna, Albuterol, Tylenol Problem List per Hospitalist: Assessment & Plan:  Right hip fracture Patient was found on the floor at her facility.  Complaining of pain.  Right hip/pelvis x-ray with acute fracture of the right femoral neck. -- Orthopedics following, appreciate assistance -- Oxycodone 5 mg p.o. every 4 hours as needed moderate pain -- Morphine 1 mg IV every 2 hours as needed severe pain -- Robaxin 500 mg p.o. every 6 hours as needed muscle spasm -- N.p.o., orthopedics plans operative management this afternoon -- NS at 75 mL/h -- Plan to return to [facility] [place] when stable for discharge  GNR Urinary tract infection Urinalysis with no leukocytes, positive nitrite, many bacteria, 0-5 WBCs. -- Urine culture: > 100K GNR --  Ceftriaxone 1 g IV every 24 hours   Essential hypertension Sinus node dysfunction s/p PPM Chronic diastolic congestive heart failure, compensated Follows with cardiology outpatient.  EKG on admission with sinus tachycardia, rate 107. -- Not on any antihypertensives or rate controlling medications at baseline -- Hydralazine 5 mg IV q6h PRN SBP >165   CKD stage IV Creatinine 0.75, stable   GAD/depression -- Lexapro 10 mg p.o. daily -- Lorazepam 0.5 mg p.o. 3 times daily as needed anxiety   Hyperlipidemia -- Currently not on any medication outpatient   GERD -- Protonix 40 mg p.o. twice daily substituted for home omeprazole   Dementia, parkinsonism Follows with neurology outpatient, Dr. Arbutus Leas.  Also hospice outpatient.    Discharge Planning- Surgery today; to evaluate needs post surgery  Family Contact- Updated patient's daughter in room.  IDT: updated ACC team Goals of Care: Surgical intervention of break today. To evaluate needs post surgery.  Thank you for the opportunity to participate in this patient's care, please don't hesitate to call for any hospice related questions or concerns.   Doreatha Martin, RN, BSN [facility] Liaison [phone]

## 2022-12-10 NOTE — Anesthesia Procedure Notes (Signed)
Spinal  Patient location during procedure: OR Start time: 12/10/2022 2:12 PM End time: 12/10/2022 2:17 PM Reason for block: surgical anesthesia Staffing Performed: anesthesiologist  Anesthesiologist: Leonides Grills, MD Performed by: Leonides Grills, MD Authorized by: Leonides Grills, MD   Preanesthetic Checklist Completed: patient identified, IV checked, risks and benefits discussed, surgical consent, monitors and equipment checked, pre-op evaluation and timeout performed Spinal Block Patient position: right lateral decubitus Prep: DuraPrep Patient monitoring: cardiac monitor, continuous pulse ox and blood pressure Approach: midline Location: L3-4 Injection technique: single-shot Needle Needle type: Quincke  Needle gauge: 22 G Needle length: 9 cm Assessment Sensory level: T10 Events: CSF return Additional Notes Functioning IV was confirmed and monitors were applied. Sterile prep and drape, including hand hygiene and sterile gloves were used. The patient was positioned and the spine was prepped. The skin was anesthetized with lidocaine.  Free flow of clear CSF was obtained on the third attempt prior to injecting local anesthetic into the CSF.  The spinal needle aspirated freely following injection.  The needle was carefully withdrawn.  The patient tolerated the procedure well.

## 2022-12-10 NOTE — Plan of Care (Signed)
  Problem: Activity: Goal: Risk for activity intolerance will decrease Outcome: Progressing   Problem: Pain Managment: Goal: General experience of comfort will improve Outcome: Progressing   Problem: Safety: Goal: Ability to remain free from injury will improve Outcome: Progressing   

## 2022-12-10 NOTE — Transfer of Care (Signed)
Immediate Anesthesia Transfer of Care Note  Patient: [name]  Procedure(s) Performed: TOTAL HIP ARTHROPLASTY ANTERIOR APPROACH (Right: Hip)  Patient Location: PACU  Anesthesia Type:Spinal  Level of Consciousness: drowsy and patient cooperative  Airway & Oxygen Therapy: Patient Spontanous Breathing and Patient connected to face mask oxygen  Post-op Assessment: Report given to RN and Post -op Vital signs reviewed and stable  Post vital signs: Reviewed and stable  Last Vitals:  Vitals Value Taken Time  BP 143/64 12/10/22 1617  Temp    Pulse 73 12/10/22 1620  Resp 15 12/10/22 1620  SpO2 100 % 12/10/22 1620  Vitals shown include unvalidated device data.  Last Pain:  Vitals:   12/10/22 1100  TempSrc: Oral  PainSc:       Patients Stated Pain Goal: 3 (12/09/22 1000)  Complications: No notable events documented.

## 2022-12-10 NOTE — TOC Progression Note (Addendum)
Transition of Care [facility]) - Progression Note   Patient Details  Name: [name] MRN: [id number] Date of Birth: 1935-03-23  Transition of Care [facility]) CM/SW Contact  Ewing Schlein, LCSW Phone Number: 12/10/2022, 1:36 PM  Clinical Narrative: CSW attempted to call Deno Lunger with [facility] twice today (11:29am, 1:32pm) and called the facility's main number twice (11:29am-spoke with Dominque, 1:32pm-spoke with Tresa Endo) to follow up with having the facility come out and assess the patient in person. CSW left a VM as well as a message with Tresa Endo requesting a call back so the patient can be assessed prior to returning to the facility. CSW spoke with patient's daughter, Phoebe Sharps, regarding issues with reaching the facility to have the patient assessed prior to discharge. Daughter to also follow up with ALF.  Addendum: 1:54pm-CSW received return call from Ms. Rumley with [facility]. Per Ms. Rumley, she will come assess the patient in-person tomorrow afternoon and will need to confirm that the patient will not discharge on any medications that need to be given via IV or injection.  Expected Discharge Plan: Assisted Living Barriers to Discharge: Continued Medical Work up  Expected Discharge Plan and Services In-house Referral: Clinical Social Work Post Acute Care Choice: Hospice Living arrangements for the past 2 months: Assisted Living Facility           DME Arranged: N/A DME Agency: NA  Social Determinants of Health (SDOH) Interventions SDOH Screenings   Food Insecurity: No Food Insecurity (12/08/2022)  Housing: Low Risk  (12/08/2022)  Transportation Needs: No Transportation Needs (12/08/2022)  Utilities: Not At Risk (12/08/2022)  Alcohol Screen: Low Risk  (04/04/2021)  Depression (PHQ2-9): Low Risk  (04/04/2021)  Financial Resource Strain: Low Risk  (04/04/2021)  Physical Activity: Inactive (04/04/2021)  Social Connections: Socially Integrated (04/04/2021)  Stress: No Stress Concern Present  (04/04/2021)  Tobacco Use: Low Risk  (12/10/2022)   Readmission Risk Interventions     No data to display

## 2022-12-10 NOTE — Telephone Encounter (Signed)
FYI  only   Patient daughter called wanted [place] to know that her mother is having a hip replacement done today.

## 2022-12-10 NOTE — Progress Notes (Addendum)
Discussed the patient's injury with her daughter, [name].  She has a new right femoral neck fx with underlying severe DJD. Patient is on home hospice following a fall with rib fxs and spine fxs. She also has Parkinson's and dementia. Baseline mobility is WB for transfers. For pain control and in order to allow restoration of mobility, I recommend R THA. We discussed the R/B/A. Nonoperative treatment would surely result in a bad outcome due to suddenly becoming bed bound. She understands and wishes to proceed. Plan for surgery today. Cont NPO, hold blood thinners. Received Lovenox yesterday at 1600. Hgb is 11.0 Type and screen ordered. I have set up 2 units PRBCs in case she requires blood intra-op.  The risks, benefits, and alternatives were discussed with the patient's POA. There are risks associated with the surgery including, but not limited to, problems with anesthesia (death), infection, instability (giving out of the joint), dislocation, differences in leg length/angulation/rotation, fracture of bones, loosening or failure of implants, hematoma (blood accumulation) which may require surgical drainage, blood clots, pulmonary embolism, nerve injury (foot drop and lateral thigh numbness), and blood vessel injury. The patient's POA understands these risks and elects to proceed.

## 2022-12-10 NOTE — Op Note (Signed)
OPERATIVE REPORT  SURGEON: Samson Frederic, MD   ASSISTANT: Clint Bolder, PA-C  PREOPERATIVE DIAGNOSIS: Displaced Right femoral neck fracture.   POSTOPERATIVE DIAGNOSIS: Displaced Right femoral neck fracture.   PROCEDURE: Right total hip arthroplasty, anterior approach.   IMPLANTS: Biomet Taperloc Reduced Distal stem, size 15 x 150 mm, high offset. Biomet G7 OsseoTi Cup, size 50 mm. Biomet Vivacit-E liner, size 36 mm, D, +5 neutral. Biomet metal head ball, size 36 - 3 mm.  ANESTHESIA:  MAC and Spinal  ANTIBIOTICS: 2g ancef.  ESTIMATED BLOOD LOSS:-75 mL    DRAINS: None.  COMPLICATIONS: None   CONDITION: PACU - hemodynamically stable.   BRIEF CLINICAL NOTE: [name] is a 88 y.o. female with a displaced Right femoral neck fracture. The patient was admitted to the hospitalist service and underwent perioperative risk stratification and medical optimization. The risks, benefits, and alternatives to total hip arthroplasty were explained, and the patient elected to proceed.  PROCEDURE IN DETAIL: The patient was taken to the operating room and general anesthesia was induced on the hospital bed.  The patient was then positioned on the Hana table.  All bony prominences were well padded.  The hip was prepped and draped in the normal sterile surgical fashion.  A time-out was called verifying side and site of surgery. Antibiotics were given within 60 minutes of beginning the procedure.   Bikini incision was made, and the direct anterior approach to the hip was performed through the Hueter interval.  Lateral femoral circumflex vessels were treated with the Auqumantys. The anterior capsule was exposed and an inverted T capsulotomy was made.  Fracture hematoma was encountered and evacuated. The patient was found to have a comminuted Right subcapital femoral neck fracture.  I freshened the femoral neck cut with a saw.  I removed the femoral neck fragment.  A corkscrew was placed into the head  and the head was removed.  This was passed to the back table and was measured. The pubofemoral ligament was released subperiosteally to the lesser trochanter.  Acetabular exposure was achieved, and the pulvinar and labrum were excised. Sequential reaming of the acetabulum was then performed up to a size 49 mm reamer under direct visulization. A 50 mm cup was then opened and impacted into place at approximately 40 degrees of abduction and 20 degrees of anteversion. The final polyethylene liner was impacted into place and acetabular osteophytes were removed.    I then gained femoral exposure taking care to protect the abductors and greater trochanter.  This was performed using standard external rotation, extension, and adduction.  A cookie cutter was used to enter the femoral canal, and then the femoral canal finder was placed.  Sequential broaching was performed up to a size 15.  Calcar planer was used on the femoral neck remnant.  I placed a high offset neck and a trial head ball.  The hip was reduced.  Leg lengths and offset were checked fluoroscopically.  The hip was dislocated and trial components were removed.  The final implants were placed, and the hip was reduced.  Fluoroscopy was used to confirm component position and leg lengths.  At 90 degrees of external rotation and full extension, the hip was stable to an anterior directed force.   The wound was copiously irrigated with Prontosan solution and normal saline using pule lavage.  Marcaine solution was injected into the periarticular soft tissue.  The wound was closed in layers using #1 Stratafix for the fascia, 2-0 Vicryl for the subcutaneous  fat, 2-0 Monocryl for the deep dermal layer, and staples + Dermabond for the skin.  Once the glue was fully dried, an Aquacell Ag dressing was applied.  The patient was transported to the recovery room in stable condition.  Sponge, needle, and instrument counts were correct at the end of the case x2.  The patient  tolerated the procedure well and there were no known complications.  The aquamantis was utilized for this case to help facilitate better hemostasis as patient was felt to be at increased risk of bleeding because of preop anemia.  A oscillating saw tip was utilized for this case to prevent damage to the soft tissue structures such as muscles, ligaments and tendons, and to ensure accurate bone cuts. This patient was at increased risk for above structures due to  minimally invasive approach.  Please note that a surgical assistant was a medical necessity for this procedure to perform it in a safe and expeditious manner. Assistant was necessary to provide appropriate retraction of vital neurovascular structures, to prevent femoral fracture, and to allow for anatomic placement of the prosthesis.

## 2022-12-10 NOTE — Anesthesia Postprocedure Evaluation (Signed)
Anesthesia Post Note  Patient: Manmeet S Gin  Procedure(s) Performed: TOTAL HIP ARTHROPLASTY ANTERIOR APPROACH (Right: Hip)     Patient location during evaluation: PACU Anesthesia Type: Spinal Level of consciousness: awake Pain management: pain level controlled Vital Signs Assessment: post-procedure vital signs reviewed and stable Respiratory status: spontaneous breathing, nonlabored ventilation and respiratory function stable Cardiovascular status: blood pressure returned to baseline and stable Postop Assessment: no apparent nausea or vomiting Anesthetic complications: no   No notable events documented.  Last Vitals:  Vitals:   12/10/22 1700 12/10/22 1715  BP: (!) 158/63 (!) 160/56  Pulse: 74 74  Resp: 16 18  Temp:    SpO2: 100% 99%    Last Pain:  Vitals:   12/10/22 1715  TempSrc:   PainSc: 0-No pain                 Jeslin Bazinet P Roberts Bon

## 2022-12-10 NOTE — Anesthesia Procedure Notes (Signed)
Procedure Name: MAC Date/Time: 12/10/2022 2:20 PM  Performed by: Adria Dill, CRNAPre-anesthesia Checklist: Patient identified, Emergency Drugs available, Suction available and Patient being monitored Patient Re-evaluated:Patient Re-evaluated prior to induction Oxygen Delivery Method: Simple face mask Preoxygenation: Pre-oxygenation with 100% oxygen Induction Type: IV induction Placement Confirmation: positive ETCO2 and breath sounds checked- equal and bilateral Dental Injury: Teeth and Oropharynx as per pre-operative assessment

## 2022-12-10 NOTE — Interval H&P Note (Signed)
History and Physical Interval Note:  12/10/2022 11:34 AM  [name]  has presented today for surgery, with the diagnosis of RIGHT FEMORAL NECK FRACTURE.  The various methods of treatment have been discussed with the patient and family. After consideration of risks, benefits and other options for treatment, the patient has consented to  Procedure(s): TOTAL HIP ARTHROPLASTY ANTERIOR APPROACH (Right) as a surgical intervention.  The patient's history has been reviewed, patient examined, no change in status, stable for surgery.  I have reviewed the patient's chart and labs.  Questions were answered to the patient's satisfaction.     Iline Oven Rhianon Zabawa

## 2022-12-11 ENCOUNTER — Encounter (HOSPITAL_COMMUNITY): Payer: Self-pay | Admitting: Orthopedic Surgery

## 2022-12-11 DIAGNOSIS — S72001A Fracture of unspecified part of neck of right femur, initial encounter for closed fracture: Secondary | ICD-10-CM | POA: Diagnosis not present

## 2022-12-11 LAB — BASIC METABOLIC PANEL
Anion gap: 9 (ref 5–15)
BUN: 21 mg/dL (ref 8–23)
CO2: 19 mmol/L — ABNORMAL LOW (ref 22–32)
Calcium: 7.9 mg/dL — ABNORMAL LOW (ref 8.9–10.3)
Chloride: 105 mmol/L (ref 98–111)
Creatinine, Ser: 0.87 mg/dL (ref 0.44–1.00)
GFR, Estimated: >60 mL/min (ref >60)
Glucose, Bld: 136 mg/dL — ABNORMAL HIGH (ref 70–99)
Potassium: 4 mmol/L (ref 3.5–5.1)
Sodium: 133 mmol/L — ABNORMAL LOW (ref 135–145)

## 2022-12-11 LAB — URINE CULTURE: Culture: >=100000 — AB

## 2022-12-11 LAB — CBC
HCT: 31.2 % — ABNORMAL LOW (ref 36.0–46.0)
Hemoglobin: 10.6 g/dL — ABNORMAL LOW (ref 12.0–15.0)
MCH: 32.3 pg (ref 26.0–34.0)
MCHC: 34 g/dL (ref 30.0–36.0)
MCV: 95.1 fL (ref 80.0–100.0)
Platelets: 246 10*3/uL (ref 150–400)
RBC: 3.28 MIL/uL — ABNORMAL LOW (ref 3.87–5.11)
RDW: 13.3 % (ref 11.5–15.5)
WBC: 15.5 10*3/uL — ABNORMAL HIGH (ref 4.0–10.5)
nRBC: 0 % (ref 0.0–0.2)

## 2022-12-11 MED ORDER — CIPROFLOXACIN HCL 500 MG PO TABS
500.0000 mg | ORAL_TABLET | Freq: Two times a day (BID) | ORAL | Status: DC
  Administered 2022-12-11 – 2022-12-12 (×3): 500 mg via ORAL
  Filled 2022-12-11 (×3): qty 1

## 2022-12-11 MED ORDER — AMLODIPINE BESYLATE 5 MG PO TABS
5.0000 mg | ORAL_TABLET | Freq: Every day | ORAL | Status: DC
  Administered 2022-12-11 – 2022-12-12 (×2): 5 mg via ORAL
  Filled 2022-12-11 (×2): qty 1

## 2022-12-11 MED ORDER — ASPIRIN 81 MG PO CHEW: 81.0000 mg | CHEWABLE_TABLET | Freq: Two times a day (BID) | ORAL | 0 refills | Status: AC

## 2022-12-11 MED ORDER — HYDROCODONE-ACETAMINOPHEN 5-325 MG PO TABS: 1.0000 | ORAL_TABLET | ORAL | 0 refills | Status: AC | PRN

## 2022-12-11 NOTE — Plan of Care (Signed)
  Problem: Activity: Goal: Risk for activity intolerance will decrease Outcome: Progressing   Problem: Nutrition: Goal: Adequate nutrition will be maintained Outcome: Progressing   Problem: Pain Managment: Goal: General experience of comfort will improve Outcome: Progressing   

## 2022-12-11 NOTE — Progress Notes (Signed)
SLP Cancellation Note  Patient Details Name: [name] MRN: [id number] DOB: 12/10/1934   Cancelled treatment:       Reason Eval/Treat Not Completed: Other (comment). Patient using bedside commode.   Ferdinand Lango MA, CCC-SLP    [name] Meryl 12/11/2022, 1:03 PM

## 2022-12-11 NOTE — [place] FL2 (Signed)
[place] MEDICAID FL2 LEVEL OF CARE FORM     IDENTIFICATION  Patient Name: [name] Birthdate: Apr 06, 1935 Sex: female Admission Date (Current Location): 12/08/2022  [facility] and [place] Number:  Producer, television/film/video and Address:  [facility],  [address] [place], [place] [address]      Provider Number: [id number]  Attending Physician Name and Address:  [place], Eric J, DO  Relative Name and Phone Number:  Phoebe Sharps (daughter) Ph: [phone]    Current Level of Care: Hospital Recommended Level of Care: Assisted Living Facility Prior Approval Number:    Date Approved/Denied:   PASRR Number:    Discharge Plan: Other (Comment) ([facility])    Current Diagnoses: Patient Active Problem List   Diagnosis Date Noted   Closed right hip fracture, initial encounter (HCC) 12/08/2022   Multiple rib fractures 05/18/2022   Thoracic spine fracture (HCC) 05/18/2022   Acute back pain 05/18/2022   Stage 3b chronic kidney disease (CKD) (HCC) 05/18/2022   Fall 05/17/2022   IBS (irritable bowel syndrome) 10/28/2020   Tremor of both hands 07/30/2020   Left arm swelling 04/10/2020   Aortic atherosclerosis (HCC) 03/30/2020   Cellulitis of arm, left 03/30/2020   Chronic thoracic back pain 02/03/2020   Iron deficiency anemia 02/02/2020   Near syncope 09/27/2019   Hypertension 09/20/2019   Acute pain of right knee 08/03/2019   (HFpEF) heart failure with preserved ejection fraction (HCC) 08/02/2019   Pacemaker 07/19/2019   CKD (chronic kidney disease) 01/30/2019   Lumbar spinal stenosis 01/10/2018   Degenerative arthritis of knee, bilateral 12/15/2017   Epigastric pain 09/09/2017   Neuralgia 09/09/2017   Claudication of calf muscles (HCC) 11/22/2016   DOE (dyspnea on exertion) 11/22/2016   Hyperreflexia of lower extremity 07/16/2016   Bilateral leg paresthesia 07/16/2016   Carotid arterial disease (HCC) 07/13/2016   Poor balance 06/23/2016   Weakness of both  lower extremities 06/23/2016   Sinus node dysfunction (HCC) 05/05/2016   Right shoulder pain 03/14/2016   Multinodular goiter (nontoxic) 02/21/2016   Neck pain 02/15/2016   Prediabetes 12/17/2015   Dysphagia 12/17/2015   Anxiety 11/13/2015   T8 vertebral fracture (HCC) 04/09/2015   Overweight 10/09/2011   Allergic rhinitis, cause unspecified    GERD (gastroesophageal reflux disease) 02/05/2011   Vitamin B 12 deficiency 04/17/2009   Hyperlipidemia 04/17/2009   Osteoporosis 04/17/2009   ADENOCARCINOMA, BREAST, HX OF 04/17/2009   Constipation 03/08/2009   COLONIC POLYPS, HYPERPLASTIC, HX OF 03/06/2009    Orientation RESPIRATION BLADDER Height & Weight     Self  Normal Incontinent Weight: 66.7 kg Height:  5\' 1"  (154.9 cm)  BEHAVIORAL SYMPTOMS/MOOD NEUROLOGICAL BOWEL NUTRITION STATUS     (N/A) Continent Diet (Regular diet)  AMBULATORY STATUS COMMUNICATION OF NEEDS Skin   Total Care Verbally Surgical wounds, Other (Comment), Skin abrasions (Abrasion: right elbow; Ecchymosis: scattered)                       Personal Care Assistance Level of Assistance  Bathing, Feeding, Dressing Bathing Assistance: Maximum assistance Feeding assistance: Independent Dressing Assistance: Maximum assistance     Functional Limitations Info  Sight, Hearing, Speech Sight Info: Impaired Hearing Info: Impaired Speech Info: Adequate    SPECIAL CARE FACTORS FREQUENCY                       Contractures      Additional Factors Info  Code Status, Allergies, Psychotropic Code Status  Info: DNR Allergies Info: Statins, Cymbalta (Duloxetine Hcl), Fosamax (Alendronate Sodium), Amitriptyline, Fosamax (Alendronate), Lyrica (Pregabalin), Ranitidine, Neurontin (Gabapentin), Zanaflex (Tizanidine) Psychotropic Info: Lexapro, Ativan         Current Medications (12/11/2022):  This is the current hospital active medication list Current Facility-Administered Medications  Medication Dose Route  Frequency Provider Last Rate Last Admin   acetaminophen (TYLENOL) tablet 325-650 mg  325-650 mg Oral Q6H PRN Swinteck, Arlys John, MD       albuterol (PROVENTIL) (2.5 MG/3ML) 0.083% nebulizer solution 2.5 mg  2.5 mg Nebulization Q2H PRN Swinteck, Arlys John, MD       alum & mag hydroxide-simeth (MAALOX/MYLANTA) 200-200-20 MG/5ML suspension 30 mL  30 mL Oral Q4H PRN Swinteck, Arlys John, MD       amLODipine (NORVASC) tablet 5 mg  5 mg Oral Daily [place], Alvira Philips, DO   5 mg at 12/11/22 8182   aspirin chewable tablet 81 mg  81 mg Oral BID Samson Frederic, MD   81 mg at 12/11/22 9937   ciprofloxacin (CIPRO) tablet 500 mg  500 mg Oral BID [place], Eric J, DO   500 mg at 12/11/22 1144   cyanocobalamin (VITAMIN B12) tablet 1,000 mcg  1,000 mcg Oral Daily Samson Frederic, MD   1,000 mcg at 12/11/22 1696   diphenhydrAMINE (BENADRYL) 12.5 MG/5ML elixir 12.5-25 mg  12.5-25 mg Oral Q4H PRN Swinteck, Arlys John, MD       docusate sodium (COLACE) capsule 100 mg  100 mg Oral BID Samson Frederic, MD   100 mg at 12/11/22 0853   escitalopram (LEXAPRO) tablet 10 mg  10 mg Oral Daily Samson Frederic, MD   10 mg at 12/11/22 7893   hydrALAZINE (APRESOLINE) injection 5 mg  5 mg Intravenous Q6H PRN Swinteck, Arlys John, MD       HYDROcodone-acetaminophen (NORCO) 7.5-325 MG per tablet 1-2 tablet  1-2 tablet Oral Q4H PRN Samson Frederic, MD       HYDROcodone-acetaminophen (NORCO/VICODIN) 5-325 MG per tablet 1-2 tablet  1-2 tablet Oral Q4H PRN Samson Frederic, MD   1 tablet at 12/11/22 0853   LORazepam (ATIVAN) tablet 0.5 mg  0.5 mg Oral TID BM PRN Swinteck, Arlys John, MD       menthol-cetylpyridinium (CEPACOL) lozenge 3 mg  1 lozenge Oral PRN Swinteck, Arlys John, MD       Or   phenol (CHLORASEPTIC) mouth spray 1 spray  1 spray Mouth/Throat PRN Swinteck, Arlys John, MD       methocarbamol (ROBAXIN) tablet 500 mg  500 mg Oral Q6H PRN Samson Frederic, MD   500 mg at 12/11/22 8101   metoCLOPramide (REGLAN) tablet 5-10 mg  5-10 mg Oral Q8H PRN Swinteck, Arlys John, MD        Or   metoCLOPramide (REGLAN) injection 5-10 mg  5-10 mg Intravenous Q8H PRN Swinteck, Arlys John, MD       morphine (PF) 2 MG/ML injection 0.5-1 mg  0.5-1 mg Intravenous Q2H PRN Swinteck, Arlys John, MD       morphine (PF) 2 MG/ML injection 1 mg  1 mg Intravenous Q2H PRN Swinteck, Arlys John, MD       ondansetron [facility]) tablet 4 mg  4 mg Oral Q6H PRN Swinteck, Arlys John, MD       Or   ondansetron [facility]) injection 4 mg  4 mg Intravenous Q6H PRN Swinteck, Arlys John, MD       oxyCODONE (Oxy IR/ROXICODONE) immediate release tablet 5 mg  5 mg Oral Q4H PRN Samson Frederic, MD   5 mg at 12/08/22 1557  polyethylene glycol (MIRALAX / GLYCOLAX) packet 17 g  17 g Oral Daily PRN Swinteck, Arlys John, MD       senna (SENOKOT) tablet 8.6 mg  1 tablet Oral BID Samson Frederic, MD   8.6 mg at 12/11/22 2440     Discharge Medications: Please see discharge summary for a list of discharge medications.  Relevant Imaging Results:  Relevant Lab Results:   Additional Information SSN: [id number]. Patient is on aspirin to prevent clots.  Armanda Heritage, RN

## 2022-12-11 NOTE — Plan of Care (Signed)
  Problem: Pain Management: Goal: Pain level will decrease with appropriate interventions Outcome: Progressing   Problem: Safety: Goal: Ability to remain free from injury will improve Outcome: Progressing   Problem: Elimination: Goal: Will not experience complications related to urinary retention Outcome: Progressing

## 2022-12-11 NOTE — Evaluation (Signed)
Physical Therapy Evaluation Patient Details Name: [name] MRN: [id number] DOB: May 25, 1935 Today's Date: 12/11/2022  History of Present Illness  Pt is an 88 year old female with past medical history significant of hypertension, Parkinson disease, hyperlipidemia, anxiety, sinus node dysfunction s/p PPM, diastolic congestive heart failure, CKD stage IV, GAD/depression, hyperlipidemia, GERD who presented to [facility] ED on 5/13 from her facility [facility] [place] via EMS after she was found on the floor of her room at her facility.  Workup in the ED with noted right hip fracture and urinalysis concerning for UTI.  Pt s/p R THA direct anterior approach on 12/10/22.  Clinical Impression  Patient is s/p above surgery resulting in functional limitations due to the deficits listed below (see PT Problem List).  Patient will benefit from acute skilled PT to increase their independence and safety with mobility to facilitate discharge.  Pt typically requiring at least min/guard for transfers at baseline.  Daughter present and reports pt occasionally needs more assist depending on the day.  Pt transfers to/from w/c and has hx of falls.  Pt requiring increased assist for bed mobility today and at least mod assist for transfer.  Per notes, ALF to visit to see if pt is able to return upon d/c.  If ALF does not feel able to provide current level of assist, pt may need higher level of care.        Recommendations for follow up therapy are one component of a multi-disciplinary discharge planning process, led by the attending physician.  Recommendations may be updated based on patient status, additional functional criteria and insurance authorization.  Follow Up Recommendations Can patient physically be transported by private vehicle: No     Assistance Recommended at Discharge PRN  Patient can return home with the following  A lot of help with walking and/or transfers;A lot of help with  bathing/dressing/bathroom    Equipment Recommendations None recommended by PT  Recommendations for Other Services       Functional Status Assessment Patient has had a recent decline in their functional status and demonstrates the ability to make significant improvements in function in a reasonable and predictable amount of time.     Precautions / Restrictions Precautions Precautions: Fall Restrictions Weight Bearing Restrictions: No RLE Weight Bearing: Non weight bearing Other Position/Activity Restrictions: WBAT with walker      Mobility  Bed Mobility Overal bed mobility: Needs Assistance Bed Mobility: Supine to Sit     Supine to sit: Total assist, +2 for physical assistance, HOB elevated     General bed mobility comments: pt requiring cues for technique, attempting to assist with LE movement however complete assist for trunk and scooting to EOB    Transfers Overall transfer level: Needs assistance Equipment used: Rolling walker (2 wheels) Transfers: Sit to/from Stand, Bed to chair/wheelchair/BSC Sit to Stand: Mod assist, +2 safety/equipment   Step pivot transfers: Min assist, +2 safety/equipment       General transfer comment: cues for technique, assist to rise and steady, required assist maneuvering RW (pt typically has person assist with her transfers and doesn't use device), taking small steps over to recliner, assist for weakness and stability    Ambulation/Gait               General Gait Details: nonambulatory at baseline  Stairs            Wheelchair Mobility    Modified Rankin (Stroke Patients Only)  Balance Overall balance assessment: History of Falls                                           Pertinent Vitals/Pain Pain Assessment Pain Assessment: PAINAD Breathing: normal Negative Vocalization: none Facial Expression: smiling or inexpressive Body Language: relaxed Consolability: no need to console PAINAD  Score: 0 Pain Intervention(s): Repositioned, Monitored during session    Home Living Family/patient expects to be discharged to:: Assisted living                 Home Equipment: Wheelchair - power;Wheelchair - manual      Prior Function Prior Level of Function : Needs assist             Mobility Comments: typically able to assist with transfers to/from w/c, w/c for mobility; hx of falls       Hand Dominance        Extremity/Trunk Assessment   Upper Extremity Assessment Upper Extremity Assessment: Generalized weakness    Lower Extremity Assessment Lower Extremity Assessment: Generalized weakness;RLE deficits/detail RLE Deficits / Details: requiring assist, grossly able to lift LE against gravity however not full range       Communication   Communication: HOH  Cognition Arousal/Alertness: Awake/alert Behavior During Therapy: WFL for tasks assessed/performed Overall Cognitive Status: History of cognitive impairments - at baseline                                 General Comments: hx dementia        General Comments      Exercises     Assessment/Plan    PT Assessment Patient needs continued PT services  PT Problem List Decreased strength;Decreased activity tolerance;Decreased balance;Decreased mobility;Decreased knowledge of use of DME       PT Treatment Interventions DME instruction;Therapeutic exercise;Functional mobility training;Wheelchair mobility training;Therapeutic activities;Patient/family education    PT Goals (Current goals can be found in the Care Plan section)  Acute Rehab PT Goals PT Goal Formulation: With patient/family Time For Goal Achievement: 12/25/22 Potential to Achieve Goals: Good    Frequency Min 1X/week     Co-evaluation               AM-PAC PT "6 Clicks" Mobility  Outcome Measure Help needed turning from your back to your side while in a flat bed without using bedrails?: Total Help needed  moving from lying on your back to sitting on the side of a flat bed without using bedrails?: Total Help needed moving to and from a bed to a chair (including a wheelchair)?: A Lot Help needed standing up from a chair using your arms (e.g., wheelchair or bedside chair)?: A Lot Help needed to walk in hospital room?: Total Help needed climbing 3-5 steps with a railing? : Total 6 Click Score: 8    End of Session Equipment Utilized During Treatment: Gait belt Activity Tolerance: Patient tolerated treatment well Patient left: in chair;with call bell/phone within reach;with chair alarm set;with family/visitor present Nurse Communication: Mobility status PT Visit Diagnosis: Difficulty in walking, not elsewhere classified (R26.2)    Time: 0960-4540 PT Time Calculation (min) (ACUTE ONLY): 19 min   Charges:   PT Evaluation $PT Eval Low Complexity: 1 Low         [name] PT, DPT Physical Therapist Acute Rehabilitation Services Office:  [phone]   [name] 12/11/2022, 12:05 PM

## 2022-12-11 NOTE — Care Management Important Message (Signed)
Important Message  Patient Details IM Letter given Name: [name] MRN: [id number] Date of Birth: [birth year]   Medicare Important Message Given:  Yes     Caren Macadam 12/11/2022, 3:50 PM

## 2022-12-11 NOTE — Progress Notes (Signed)
    Subjective: Patient has dementia at baseline. She is oriented to self this morning.  Patient reports pain as mild to moderate with thigh soreness.  Denies N/V/CP/SOB.  She denies any tingling or numbness in LE bilaterally.   Objective:   VITALS:   Vitals:   12/10/22 1744 12/10/22 2136 12/11/22 0118 12/11/22 0556  BP: (!) 151/61 (!) 112/58 (!) 141/66 (!) 175/65  Pulse: 77 79 81 89  Resp: 16 15 18 15   Temp: 98.1 F (36.7 C) 97.7 F (36.5 C) 98.6 F (37 C) 98.7 F (37.1 C)  TempSrc: Oral Oral    SpO2: 100% 100% 99%   Weight:      Height:        Patient sitting up in bed. NAD.  She is oriented to self this morning.  Neurologically intact ABD soft Neurovascular intact Sensation intact distally Intact pulses distally Dorsiflexion/Plantar flexion intact Incision: dressing C/D/I No cellulitis present Compartment soft She has ice on her thigh this morning.    Lab Results  Component Value Date   WBC 15.5 (H) 12/11/2022   HGB 10.6 (L) 12/11/2022   HCT 31.2 (L) 12/11/2022   MCV 95.1 12/11/2022   PLT 246 12/11/2022   BMET    Component Value Date/Time   NA 133 (L) 12/11/2022 0339   NA 139 03/12/2022 1052   K 4.0 12/11/2022 0339   CL 105 12/11/2022 0339   CO2 19 (L) 12/11/2022 0339   GLUCOSE 136 (H) 12/11/2022 0339   BUN 21 12/11/2022 0339   BUN 16 03/12/2022 1052   CREATININE 0.87 12/11/2022 0339   CREATININE 1.23 (H) 03/30/2020 1011   CALCIUM 7.9 (L) 12/11/2022 0339   EGFR 45 (L) 03/12/2022 1052   GFRNONAA >60 12/11/2022 0339   GFRNONAA 41 (L) 03/30/2020 1011     Assessment/Plan: 1 Day Post-Op   Principal Problem:   Closed right hip fracture, initial encounter (HCC) Active Problems:   GERD (gastroesophageal reflux disease)   Prediabetes   (HFpEF) heart failure with preserved ejection fraction (HCC)   Stage 3b chronic kidney disease (CKD) (HCC)   WBAT with walker as tolerated.  DVT ppx: Aspirin, SCDs, TEDS PO pain control PT/OT as tolerated.   Dispo: Patient under care of the medical team, disposition per their recommendation. Patient on hospice care. WB  usually for transfers only. Pain medication and DVT ppx printed in chart.    Clois Dupes, PA-C 12/11/2022, 7:29 AM   [facility]  [place] [address], [place], [place] [address] Phone: [phone] [email or web address] Facebook  [employer]

## 2022-12-11 NOTE — Progress Notes (Addendum)
PROGRESS NOTE    [name]  ZOX:[id number] DOB: Oct 02, 1934 DOA: 12/08/2022 PCP: Pcp, No    Brief Narrative:   [name] is a 88 y.o. female with past medical history significant for dementia, parkinsonism, essential hypertension, sinus node dysfunction s/p PPM, diastolic congestive heart failure, CKD stage IV, GAD/depression, hyperlipidemia, GERD who presented to [facility] ED on 5/13 from her facility [facility] [place] via EMS after she was found on the floor of her room at her facility.  Workup in the ED with noted right hip fracture and urinalysis concerning for UTI.  CT head/C-spine unrevealing.  Orthopedics was consulted and TRH consulted for admission for further evaluation management.  Assessment & Plan:   Right hip fracture Patient was found on the floor at her facility.  Complaining of pain.  Right hip/pelvis x-ray with acute fracture of the right femoral neck.  Orthopedics was consulted and patient underwent right total hip arthroplasty anterior approach by Dr. Linna Caprice on 12/10/2022. -- Orthopedics following, appreciate assistance -- Oxycodone 5 mg p.o. every 4 hours as needed moderate pain -- Morphine 1 mg IV every 2 hours as needed severe pain -- Robaxin 500 mg p.o. every 6 hours as needed muscle spasm -- Aspirin 81 mg p.o. twice daily for DVT prophylaxis per orthopedics -- Pending PT/OT evaluation: Pending -- Plan to return to [place] [facility] [place] when stable for discharge; per SW, facility will assess patient this afternoon  Pseudomonas urinary tract infection -- transition cefepime to ciprofloxacin 5 mg p.o. twice daily to complete 5-day course  Essential hypertension Sinus node dysfunction s/p PPM Chronic diastolic congestive heart failure, compensated Follows with cardiology outpatient.  EKG on admission with sinus tachycardia, rate 107. -- Not on any antihypertensives or rate controlling medications at baseline -- Started on amlodipine 5 mg p.o. daily  for elevated blood pressure -- Hydralazine 5 mg IV q6h PRN SBP >165  CKD stage IV Creatinine 0.87, stable  GAD/depression -- Lexapro 10 mg p.o. daily -- Lorazepam 0.5 mg p.o. 3 times daily as needed anxiety  Hyperlipidemia -- Currently not on any medication outpatient  GERD -- Protonix 40 mg p.o. twice daily substituted for home omeprazole  Dementia, parkinsonism Follows with neurology outpatient, Dr. Arbutus Leas.  Also follows with hospice outpatient.   DVT prophylaxis: SCDs Start: 12/10/22 1746    Code Status: DNR Family Communication: Updated patient's daughter Vickie and patient's pastor at bedside this morning Disposition Plan:  Level of care: Med-Surg Status is: Inpatient Remains inpatient appropriate because: Pending PT/OT evaluation, anticipate discharge back to [facility] care possibly tomorrow    Consultants:  Ortho, Dr. Linna Caprice  Procedures:  right total hip arthroplasty anterior approach by Dr. Linna Caprice on 12/10/2022.  Antimicrobials:  Ceftriaxone 5/14 - 5/15 Cefepime 5/15 - 5/16 Ciprofloxacin 5/16>>   Subjective: Patient seen examined bedside, resting comfortably.  Lying in bed.  Daughter and family pastor present at bedside this morning.  Remains pleasantly confused.  Reports some soreness to leg.  RN present at bedside.   Pending PT/OT evaluation.  Also social work reports facility will evaluate patient this afternoon.  No acute concerns overnight per nursing staff.  Updated daughter via telephone this morning.  Hopeful for return back to facility possibly tomorrow.  Objective: Vitals:   12/10/22 1744 12/10/22 2136 12/11/22 0118 12/11/22 0556  BP: (!) 151/61 (!) 112/58 (!) 141/66 (!) 175/65  Pulse: 77 79 81 89  Resp: 16 15 18 15   Temp: 98.1 F (36.7 C) 97.7  F (36.5 C) 98.6 F (37 C) 98.7 F (37.1 C)  TempSrc: Oral Oral    SpO2: 100% 100% 99%   Weight:      Height:        Intake/Output Summary (Last 24 hours) at 12/11/2022 1043 Last  data filed at 12/11/2022 0910 Gross per 24 hour  Intake 3505.55 ml  Output 975 ml  Net 2530.55 ml   Filed Weights   12/08/22 0950 12/10/22 1109  Weight: 66.7 kg 66.7 kg    Examination:  Physical Exam: GEN: NAD, alert, pleasant confused, chronically ill/elderly in appearance HEENT: NCAT, PERRL, EOMI, sclera clear, MMM PULM: CTAB w/o wheezes/crackles, normal respiratory effort, on room air CV: RRR w/ + SEM LUSB, no gallop/rub; left chest PPM generator appreciated GI: abd soft, NTND, + BS MSK: no peripheral edema, moves all extremities independently, neurovascularly intact, surgical dressing noted right hip clean/dry/intact Integumentary: No concerning rashes/lesions/wounds noted on exposed skin surfaces    Data Reviewed: I have personally reviewed following labs and imaging studies  CBC: Recent Labs  Lab 12/08/22 1010 12/09/22 0350 12/10/22 0335 12/11/22 0339  WBC 17.5* 10.2 9.9 15.5*  NEUTROABS 15.5*  --   --   --   HGB 12.0 10.8* 11.0* 10.6*  HCT 36.5 32.9* 33.8* 31.2*  MCV 98.1 97.1 96.3 95.1  PLT 310 263 268 246   Basic Metabolic Panel: Recent Labs  Lab 12/08/22 1010 12/09/22 0350 12/10/22 0335 12/11/22 0339  NA 138 136 135 133*  K 3.7 3.4* 4.0 4.0  CL 104 105 105 105  CO2 23 22 23  19*  GLUCOSE 117* 110* 110* 136*  BUN 22 16 17 21   CREATININE 0.95 0.75 0.80 0.87  CALCIUM 9.2 8.5* 8.3* 7.9*  MG  --   --  1.8  --    GFR: Estimated Creatinine Clearance: 41.3 mL/min (by C-G formula based on SCr of 0.87 mg/dL). Liver Function Tests: No results for input(s): "AST", "ALT", "ALKPHOS", "BILITOT", "PROT", "ALBUMIN" in the last 168 hours. No results for input(s): "LIPASE", "AMYLASE" in the last 168 hours. No results for input(s): "AMMONIA" in the last 168 hours. Coagulation Profile: Recent Labs  Lab 12/08/22 1010  INR 0.9   Cardiac Enzymes: No results for input(s): "CKTOTAL", "CKMB", "CKMBINDEX", "TROPONINI" in the last 168 hours. BNP (last 3 results) No  results for input(s): "PROBNP" in the last 8760 hours. HbA1C: No results for input(s): "HGBA1C" in the last 72 hours. CBG: Recent Labs  Lab 12/08/22 0954  GLUCAP 111*   Lipid Profile: No results for input(s): "CHOL", "HDL", "LDLCALC", "TRIG", "CHOLHDL", "LDLDIRECT" in the last 72 hours. Thyroid Function Tests: No results for input(s): "TSH", "T4TOTAL", "FREET4", "T3FREE", "THYROIDAB" in the last 72 hours. Anemia Panel: No results for input(s): "VITAMINB12", "FOLATE", "FERRITIN", "TIBC", "IRON", "RETICCTPCT" in the last 72 hours. Sepsis Labs: No results for input(s): "PROCALCITON", "LATICACIDVEN" in the last 168 hours.  Recent Results (from the past 240 hour(s))  Urine Culture (for pregnant, neutropenic or urologic patients or patients with an indwelling urinary catheter)     Status: Abnormal   Collection Time: 12/08/22  1:45 PM   Specimen: Urine, Clean Catch  Result Value Ref Range Status   Specimen Description   Final    URINE, CLEAN CATCH Performed at [facility], [address], [place], [place] [address]    Special Requests   Final    NONE Performed at [facility], [address], [place], [place] [address]    Culture >=  100,000 COLONIES/mL PSEUDOMONAS AERUGINOSA (A)  Final   Report Status 12/11/2022 FINAL  Final   Organism ID, Bacteria PSEUDOMONAS AERUGINOSA (A)  Final      Susceptibility   Pseudomonas aeruginosa - MIC*    CEFTAZIDIME <=1 SENSITIVE Sensitive     CIPROFLOXACIN <=0.25 SENSITIVE Sensitive     GENTAMICIN <=1 SENSITIVE Sensitive     IMIPENEM <=0.25 SENSITIVE Sensitive     PIP/TAZO <=4 SENSITIVE Sensitive     CEFEPIME 0.5 SENSITIVE Sensitive     * >=100,000 COLONIES/mL PSEUDOMONAS AERUGINOSA  Surgical pcr screen     Status: None   Collection Time: 12/09/22  3:12 PM   Specimen: Nasal Mucosa; Nasal Swab  Result Value Ref Range Status   MRSA, PCR NEGATIVE NEGATIVE Final   Staphylococcus aureus NEGATIVE NEGATIVE Final     Comment: (NOTE) The Xpert SA Assay (FDA approved for NASAL specimens in patients 60 years of age and older), is one component of a comprehensive surveillance program. It is not intended to diagnose infection nor to guide or monitor treatment. Performed at [facility], [address], [place], [place] [address]          Radiology Studies: DG Pelvis Portable  Result Date: 12/10/2022 CLINICAL DATA:  Postoperative check.  Post right hip surgery EXAM: PORTABLE PELVIS 1-2 VIEWS COMPARISON:  05/17/2022 FINDINGS: Postoperative changes with right total hip arthroplasty using non cemented components. Components appear well seated. No evidence of acute fracture or dislocation. No focal bone lesions. Skin clips and soft tissue gas are consistent with recent surgery. IMPRESSION: Non cemented right hip arthroplasty. Components appear well seated. No acute bony abnormalities. Electronically Signed   By: Burman Nieves M.D.   On: 12/10/2022 17:26   DG HIP UNILAT WITH PELVIS 1V RIGHT  Result Date: 12/10/2022 CLINICAL DATA:  [id number] Surgery, elective [id number] EXAM: DG HIP (WITH OR WITHOUT PELVIS) 1V RIGHT COMPARISON:  None Available. FINDINGS: Intraoperative images during right hip arthroplasty. Normal alignment. No evidence of loosening or periprosthetic fracture. Expected soft tissue changes. IMPRESSION: Intraoperative images during right hip arthroplasty. Normal alignment. No evidence of immediate hardware complication. Electronically Signed   By: Caprice Renshaw M.D.   On: 12/10/2022 16:07   DG C-Arm 1-60 Min-No Report  Result Date: 12/10/2022 Fluoroscopy was utilized by the requesting physician.  No radiographic interpretation.   DG C-Arm 1-60 Min-No Report  Result Date: 12/10/2022 Fluoroscopy was utilized by the requesting physician.  No radiographic interpretation.        Scheduled Meds:  amLODipine  5 mg Oral Daily   aspirin  81 mg Oral BID   ciprofloxacin  500 mg Oral  BID   cyanocobalamin  1,000 mcg Oral Daily   docusate sodium  100 mg Oral BID   escitalopram  10 mg Oral Daily   senna  1 tablet Oral BID   Continuous Infusions:     LOS: 3 days    Time spent: 51 minutes spent on chart review, discussion with nursing staff, consultants, updating family and interview/physical exam; more than 50% of that time was spent in counseling and/or coordination of care.    Alvira Philips [place], DO [facility] Available via Epic secure chat 7am-7pm After these hours, please refer to coverage provider listed on [email or web address] 12/11/2022, 10:43 AM

## 2022-12-11 NOTE — Progress Notes (Signed)
[facility]) Hospitalized Hospice patient visit   [name] is a current [facility] hospice patient with a terminal diagnosis of parkinson's disease without dyskinesia and dementia.  Patient has secondary diagnoses of stage IV chronic kidney disease and heart failure. Patient was transported to the hospital 5.13 via EMS after patient was found in the floor of her room at the [facility] [place].  Patient's code status with ACC is DNR.  According to Dr. Patric Dykes with [facility], this is a hospice related admission.  Rounded at bedside with patient and daughter. Patient states her pain is there but she is feeling better this morning. Daughter stated surgery went well, plan per MD is for PT/OT evaluation today. Additionally, RN from [facility] coming to hospital today to evaluate patient to see if she can be discharged back to facility once medically stable for discharge. Daughter verbalized understanding of the plan and voiced no additional needs.   Vital Signs- 98.7/89/15    175/65   99% Room Air I&O- 3166/1225 Abnormal Labs- WBC 15.5, RBC 3.28, Hgb 10.6, Hct 31.2, Sodium 133, CO2 19, Calcium 7.9, Troponin 43, Glucose 136 Diagnostics- none   IV/PRN Meds- Senna, Miralax, Oxydodone IR, Zofran, Morphine, Robaxin, Ativan, Vicodin, Hydralazine, Albuterol, Tylenol Problem List per Hospitalist: Assessment & Plan: Right hip fracture Patient was found on the floor at her facility.  Complaining of pain.  Right hip/pelvis x-ray with acute fracture of the right femoral neck.  Orthopedics was consulted and patient underwent right total hip arthroplasty anterior approach by Dr. Linna Caprice on 12/10/2022. -- Orthopedics following, appreciate assistance -- Oxycodone 5 mg p.o. every 4 hours as needed moderate pain -- Morphine 1 mg IV every 2 hours as needed severe pain -- Robaxin 500 mg p.o. every 6 hours as needed muscle spasm -- Aspirin 81 mg p.o. twice daily for DVT prophylaxis per  orthopedics -- Pending PT/OT evaluation: Pending -- Plan to return to [facility] [place] when stable for discharge; per SW, facility will assess patient this afternoon  Pseudomonas urinary tract infection -- transition cefepime to ciprofloxacin 5 mg p.o. twice daily to complete 5-day course  Essential hypertension Sinus node dysfunction s/p PPM Chronic diastolic congestive heart failure, compensated Follows with cardiology outpatient.  EKG on admission with sinus tachycardia, rate 107. -- Not on any antihypertensives or rate controlling medications at baseline -- Started on amlodipine 5 mg p.o. daily for elevated blood pressure -- Hydralazine 5 mg IV q6h PRN SBP >165   CKD stage IV Creatinine 0.87, stable   GAD/depression -- Lexapro 10 mg p.o. daily -- Lorazepam 0.5 mg p.o. 3 times daily as needed anxiety   Hyperlipidemia -- Currently not on any medication outpatient   GERD -- Protonix 40 mg p.o. twice daily substituted for home omeprazole   Dementia, parkinsonism Follows with neurology outpatient, Dr. Arbutus Leas.  Also follows with hospice outpatient.    Discharge Planning- PT/OT eval pending; [facility] RN to evaluate if patient can discharge back to facility once medically ready.  Family Contact- Updated patient's daughter in room.  IDT: updated ACC team Goals of Care: PT/OT evaluation for recommendations; pain control.  Thank you for the opportunity to participate in this patient's care, please don't hesitate to call for any hospice related questions or concerns.   Doreatha Martin, RN, BSN [facility] Liaison [phone]

## 2022-12-11 NOTE — TOC Progression Note (Signed)
Transition of Care [facility]) - Progression Note    Patient Details  Name: [name] MRN: [id number] Date of Birth: 11/07/1934  Transition [facility]) CM/SW Contact  Armanda Heritage, RN Phone Number: 12/11/2022, 3:30 PM  Clinical Narrative:    CM spoke with [facility], Mrs Caroleen Hamman, who reports patient can return tomorrow to facility.  She has received the [facility] faxed by this writer, however requests an amended version reflecting discharge medication, have explained that we can accommodate this request at the time of discharge.    Expected Discharge Plan: Assisted Living Barriers to Discharge: Continued Medical Work up  Expected Discharge Plan and Services In-house Referral: Clinical Social Work   Post Acute Care Choice: Hospice Living arrangements for the past 2 months: Assisted Living Facility                 DME Arranged: N/A DME Agency: NA                   Social Determinants of Health (SDOH) Interventions SDOH Screenings   Food Insecurity: No Food Insecurity (12/08/2022)  Housing: Low Risk  (12/08/2022)  Transportation Needs: No Transportation Needs (12/08/2022)  Utilities: Not At Risk (12/08/2022)  Alcohol Screen: Low Risk  (04/04/2021)  Depression (PHQ2-9): Low Risk  (04/04/2021)  Financial Resource Strain: Low Risk  (04/04/2021)  Physical Activity: Inactive (04/04/2021)  Social Connections: Socially Integrated (04/04/2021)  Stress: No Stress Concern Present (04/04/2021)  Tobacco Use: Low Risk  (12/11/2022)    Readmission Risk Interventions     No data to display

## 2022-12-12 ENCOUNTER — Telehealth: Payer: Self-pay | Admitting: Internal Medicine

## 2022-12-12 DIAGNOSIS — S72001A Fracture of unspecified part of neck of right femur, initial encounter for closed fracture: Secondary | ICD-10-CM | POA: Diagnosis not present

## 2022-12-12 LAB — CBC
HCT: 27.1 % — ABNORMAL LOW (ref 36.0–46.0)
Hemoglobin: 8.8 g/dL — ABNORMAL LOW (ref 12.0–15.0)
MCH: 32.1 pg (ref 26.0–34.0)
MCHC: 32.5 g/dL (ref 30.0–36.0)
MCV: 98.9 fL (ref 80.0–100.0)
Platelets: 248 10*3/uL (ref 150–400)
RBC: 2.74 MIL/uL — ABNORMAL LOW (ref 3.87–5.11)
RDW: 13.5 % (ref 11.5–15.5)
WBC: 9.7 10*3/uL (ref 4.0–10.5)
nRBC: 0 % (ref 0.0–0.2)

## 2022-12-12 LAB — BASIC METABOLIC PANEL
Anion gap: 8 (ref 5–15)
BUN: 21 mg/dL (ref 8–23)
CO2: 20 mmol/L — ABNORMAL LOW (ref 22–32)
Calcium: 8 mg/dL — ABNORMAL LOW (ref 8.9–10.3)
Chloride: 109 mmol/L (ref 98–111)
Creatinine, Ser: 0.79 mg/dL (ref 0.44–1.00)
GFR, Estimated: >60 mL/min (ref >60)
Glucose, Bld: 102 mg/dL — ABNORMAL HIGH (ref 70–99)
Potassium: 3.6 mmol/L (ref 3.5–5.1)
Sodium: 137 mmol/L (ref 135–145)

## 2022-12-12 MED ORDER — CIPROFLOXACIN HCL 500 MG PO TABS: 500.0000 mg | ORAL_TABLET | Freq: Two times a day (BID) | ORAL | 0 refills | Status: AC

## 2022-12-12 MED ORDER — AMLODIPINE BESYLATE 5 MG PO TABS: 5.0000 mg | ORAL_TABLET | Freq: Every day | ORAL | 0 refills | Status: AC

## 2022-12-12 NOTE — Progress Notes (Signed)
[facility]) Hospitalized Hospice patient visit   [name] is a current [facility] hospice patient with a terminal diagnosis of parkinson's disease without dyskinesia and dementia.  Patient has secondary diagnoses of stage IV chronic kidney disease and heart failure. Patient was transported to the hospital 5.13 via EMS after patient was found in the floor of her room at the [facility] [place].  Patient's code status with ACC is DNR.  According to Dr. Patric Dykes with [facility], this is a hospice related admission.  Rounded with bedside RN who states patient up in chair, did well with transfer and is likely at baseline. Per RN tentative plan for discharge back to facility today. Patient sleeping comfortably in chair in room.  Called patient's daughter, [name] to update. Confirmed with TOC plan for discharge today via [facility].   Vital Signs- 97.7/81/15    143/70   98% Room Air I&O- 553/225 Abnormal Labs- RBC 2.74, Hgb 8.8, Hct 27.1, CO2 20, Calcium 8.0, Glucose 102 Diagnostics- none   IV/PRN Meds- Tylenol 650mg , Vicodin 5-325mg , Zofran 4mg , Miralax 17g Problem List per Hospitalist: Pseudomonas urinary tract infection -- transitioned cefepime to ciprofloxacin 5 mg p.o. twice daily to complete 5-day course, she will be discharged on 4 more days.   Essential hypertension Sinus node dysfunction s/p PPM Chronic diastolic congestive heart failure, compensated Follows with cardiology outpatient.  EKG on admission with sinus tachycardia, rate 107. -- Not on any antihypertensives or rate controlling medications at baseline -- Started on amlodipine 5 mg p.o. daily for elevated blood pressure   CKD stage IV ruled out Patient's main is within normal limit.  She does not appear to have any CKD.   GAD/depression -- Lexapro 10 mg p.o. daily   Hyperlipidemia -- Currently not on any medication outpatient   GERD -- Protonix 40 mg p.o. twice daily substituted for home  omeprazole   Dementia, parkinsonism Follows with neurology outpatient, Dr. Arbutus Leas.  Also follows with hospice outpatient.   Discharge Diagnoses:  Principal Problem:   Closed right hip fracture, initial encounter (HCC) Active Problems:   GERD (gastroesophageal reflux disease)   Prediabetes   (HFpEF) heart failure with preserved ejection fraction (HCC)   Stage 3b chronic kidney disease (CKD) (HCC)    Discharge Planning- To discharge back to [facility] today per TOC.  Family Contact- Updated patient's daughter via phone.  IDT: updated ACC team Goals of Care: Discharge today.  Thank you for the opportunity to participate in this patient's care, please don't hesitate to call for any hospice related questions or concerns.   Doreatha Martin, RN, BSN [facility] Liaison [phone]

## 2022-12-12 NOTE — Telephone Encounter (Signed)
Daughter stated she got her mother a call button pendant for her assisted living facility and wants to know if this could affect her pacemaker.

## 2022-12-12 NOTE — Telephone Encounter (Signed)
Spoke to the pt daughter [name], [place],  pt is currently in a assisted living and she has a life alert necklace. Pt daughter wanted to know if the necklace will interference with the pt pacemaker. Advised pt daughter the life alert necklace should not cause any interference. Vickie voiced understanding.

## 2022-12-12 NOTE — [place] FL2 (Signed)
[place] MEDICAID FL2 LEVEL OF CARE FORM     IDENTIFICATION  Patient Name: [name] Birthdate: 22-Sep-1934 Sex: female Admission Date (Current Location): 12/08/2022  [facility] and [place] Number:  Producer, television/film/video and Address:  [facility],  [address] [place], [place] [address]      Provider Number: [id number]  Attending Physician Name and Address:  Hughie Closs, MD  Relative Name and Phone Number:  Phoebe Sharps (daughter) Ph: [phone]    Current Level of Care: Hospital Recommended Level of Care: Assisted Living Facility Prior Approval Number:    Date Approved/Denied:   PASRR Number:    Discharge Plan: Other (Comment) ([facility])    Current Diagnoses: Patient Active Problem List   Diagnosis Date Noted   Closed right hip fracture, initial encounter (HCC) 12/08/2022   Multiple rib fractures 05/18/2022   Thoracic spine fracture (HCC) 05/18/2022   Acute back pain 05/18/2022   Stage 3b chronic kidney disease (CKD) (HCC) 05/18/2022   Fall 05/17/2022   IBS (irritable bowel syndrome) 10/28/2020   Tremor of both hands 07/30/2020   Left arm swelling 04/10/2020   Aortic atherosclerosis (HCC) 03/30/2020   Cellulitis of arm, left 03/30/2020   Chronic thoracic back pain 02/03/2020   Iron deficiency anemia 02/02/2020   Near syncope 09/27/2019   Hypertension 09/20/2019   Acute pain of right knee 08/03/2019   (HFpEF) heart failure with preserved ejection fraction (HCC) 08/02/2019   Pacemaker 07/19/2019   CKD (chronic kidney disease) 01/30/2019   Lumbar spinal stenosis 01/10/2018   Degenerative arthritis of knee, bilateral 12/15/2017   Epigastric pain 09/09/2017   Neuralgia 09/09/2017   Claudication of calf muscles (HCC) 11/22/2016   DOE (dyspnea on exertion) 11/22/2016   Hyperreflexia of lower extremity 07/16/2016   Bilateral leg paresthesia 07/16/2016   Carotid arterial disease (HCC) 07/13/2016   Poor balance 06/23/2016   Weakness of both  lower extremities 06/23/2016   Sinus node dysfunction (HCC) 05/05/2016   Right shoulder pain 03/14/2016   Multinodular goiter (nontoxic) 02/21/2016   Neck pain 02/15/2016   Prediabetes 12/17/2015   Dysphagia 12/17/2015   Anxiety 11/13/2015   T8 vertebral fracture (HCC) 04/09/2015   Overweight 10/09/2011   Allergic rhinitis, cause unspecified    GERD (gastroesophageal reflux disease) 02/05/2011   Vitamin B 12 deficiency 04/17/2009   Hyperlipidemia 04/17/2009   Osteoporosis 04/17/2009   ADENOCARCINOMA, BREAST, HX OF 04/17/2009   Constipation 03/08/2009   COLONIC POLYPS, HYPERPLASTIC, HX OF 03/06/2009    Orientation RESPIRATION BLADDER Height & Weight     Self  Normal Incontinent Weight: 147 lb 0.8 oz (66.7 kg) Height:  5\' 1"  (154.9 cm)  BEHAVIORAL SYMPTOMS/MOOD NEUROLOGICAL BOWEL NUTRITION STATUS     (N/A) Continent Diet (Regular diet)  AMBULATORY STATUS COMMUNICATION OF NEEDS Skin   Total Care Verbally Surgical wounds, Other (Comment), Skin abrasions (Abrasion: right elbow; Ecchymosis: scattered)                       Personal Care Assistance Level of Assistance  Bathing, Feeding, Dressing Bathing Assistance: Maximum assistance Feeding assistance: Independent Dressing Assistance: Maximum assistance     Functional Limitations Info  Sight, Hearing, Speech Sight Info: Impaired Hearing Info: Impaired Speech Info: Adequate    SPECIAL CARE FACTORS FREQUENCY                       Contractures      Additional Factors Info  Code Status, Allergies,  Psychotropic Code Status Info: DNR Allergies Info: Statins, Cymbalta (Duloxetine Hcl), Fosamax (Alendronate Sodium), Amitriptyline, Fosamax (Alendronate), Lyrica (Pregabalin), Ranitidine, Neurontin (Gabapentin), Zanaflex (Tizanidine) Psychotropic Info: Lexapro, Ativan         Current Medications (12/12/2022):  This is the current hospital active medication list Current Facility-Administered Medications   Medication Dose Route Frequency Provider Last Rate Last Admin   acetaminophen (TYLENOL) tablet 325-650 mg  325-650 mg Oral Q6H PRN Samson Frederic, MD   650 mg at 12/11/22 1735   albuterol (PROVENTIL) (2.5 MG/3ML) 0.083% nebulizer solution 2.5 mg  2.5 mg Nebulization Q2H PRN Swinteck, Arlys John, MD       alum & mag hydroxide-simeth (MAALOX/MYLANTA) 200-200-20 MG/5ML suspension 30 mL  30 mL Oral Q4H PRN Swinteck, Arlys John, MD       amLODipine (NORVASC) tablet 5 mg  5 mg Oral Daily [place], Alvira Philips, DO   5 mg at 12/12/22 0454   aspirin chewable tablet 81 mg  81 mg Oral BID Samson Frederic, MD   81 mg at 12/12/22 0981   ciprofloxacin (CIPRO) tablet 500 mg  500 mg Oral BID [place], Eric J, DO   500 mg at 12/12/22 0820   cyanocobalamin (VITAMIN B12) tablet 1,000 mcg  1,000 mcg Oral Daily Samson Frederic, MD   1,000 mcg at 12/12/22 1914   diphenhydrAMINE (BENADRYL) 12.5 MG/5ML elixir 12.5-25 mg  12.5-25 mg Oral Q4H PRN Swinteck, Arlys John, MD       docusate sodium (COLACE) capsule 100 mg  100 mg Oral BID Samson Frederic, MD   100 mg at 12/12/22 0956   escitalopram (LEXAPRO) tablet 10 mg  10 mg Oral Daily Swinteck, Arlys John, MD   10 mg at 12/12/22 7829   hydrALAZINE (APRESOLINE) injection 5 mg  5 mg Intravenous Q6H PRN Swinteck, Arlys John, MD       HYDROcodone-acetaminophen (NORCO) 7.5-325 MG per tablet 1-2 tablet  1-2 tablet Oral Q4H PRN Samson Frederic, MD       HYDROcodone-acetaminophen (NORCO/VICODIN) 5-325 MG per tablet 1-2 tablet  1-2 tablet Oral Q4H PRN Samson Frederic, MD   1 tablet at 12/11/22 2106   LORazepam (ATIVAN) tablet 0.5 mg  0.5 mg Oral TID BM PRN Swinteck, Arlys John, MD       menthol-cetylpyridinium (CEPACOL) lozenge 3 mg  1 lozenge Oral PRN Swinteck, Arlys John, MD       Or   phenol (CHLORASEPTIC) mouth spray 1 spray  1 spray Mouth/Throat PRN Swinteck, Arlys John, MD       methocarbamol (ROBAXIN) tablet 500 mg  500 mg Oral Q6H PRN Samson Frederic, MD   500 mg at 12/11/22 5621   metoCLOPramide (REGLAN) tablet 5-10  mg  5-10 mg Oral Q8H PRN Swinteck, Arlys John, MD       Or   metoCLOPramide (REGLAN) injection 5-10 mg  5-10 mg Intravenous Q8H PRN Swinteck, Arlys John, MD       morphine (PF) 2 MG/ML injection 0.5-1 mg  0.5-1 mg Intravenous Q2H PRN Swinteck, Arlys John, MD       morphine (PF) 2 MG/ML injection 1 mg  1 mg Intravenous Q2H PRN Swinteck, Arlys John, MD       ondansetron [facility]) tablet 4 mg  4 mg Oral Q6H PRN Swinteck, Arlys John, MD   4 mg at 12/11/22 1512   Or   ondansetron (ZOFRAN) injection 4 mg  4 mg Intravenous Q6H PRN Swinteck, Arlys John, MD       oxyCODONE (Oxy IR/ROXICODONE) immediate release tablet 5 mg  5 mg Oral Q4H PRN Samson Frederic, MD  5 mg at 12/08/22 1557   polyethylene glycol (MIRALAX / GLYCOLAX) packet 17 g  17 g Oral Daily PRN Samson Frederic, MD   17 g at 12/12/22 0956   senna (SENOKOT) tablet 8.6 mg  1 tablet Oral BID Samson Frederic, MD   8.6 mg at 12/12/22 1610    Discharge Medications: Please see discharge summary for a list of discharge medications. TAKE these medications     acetaminophen 500 MG tablet Commonly known as: TYLENOL Take 500 mg by mouth every 8 (eight) hours as needed for mild pain.    amLODipine 5 MG tablet Commonly known as: NORVASC Take 1 tablet (5 mg total) by mouth daily.    aspirin 81 MG chewable tablet Commonly known as: Aspirin Childrens Chew 1 tablet (81 mg total) by mouth 2 (two) times daily with a meal.    ciprofloxacin 500 MG tablet Commonly known as: CIPRO Take 1 tablet (500 mg total) by mouth 2 (two) times daily for 4 days.    cyanocobalamin 1000 MCG tablet Commonly known as: VITAMIN B12 Take 1,000 mcg by mouth daily.    escitalopram 10 MG tablet Commonly known as: Lexapro Take one tablet daily    HYDROcodone-acetaminophen 5-325 MG tablet Commonly known as: NORCO/VICODIN Take 1 tablet by mouth every 4 (four) hours as needed for up to 7 days for moderate pain or severe pain.    LORazepam 0.5 MG tablet Commonly known as: ATIVAN Take 0.5 mg by  mouth 3 (three) times daily between meals as needed for anxiety.    omeprazole 40 MG capsule Commonly known as: PRILOSEC TAKE 1 CAPSULE BY MOUTH TWICE A DAY FOR GERD. What changed:  how much to take how to take this when to take this additional instructions    polyethylene glycol 17 g packet Commonly known as: MIRALAX / GLYCOLAX Take 17 g by mouth daily as needed (constipation- MIX AND DRINK AS DIRECTED).    senna-docusate 8.6-50 MG tablet Commonly known as: Senokot-S Take 1 tablet by mouth 2 (two) times daily between meals as needed for mild constipation.    traMADol 50 MG tablet Commonly known as: ULTRAM Take 25 mg by mouth 3 (three) times daily.    Immunologist   Relevant Imaging Results:  Relevant Lab Results:   Additional Information SSN: [id number]. Patient is on aspirin to prevent clots.  Ewing Schlein, LCSW

## 2022-12-12 NOTE — Plan of Care (Signed)

## 2022-12-12 NOTE — TOC Transition Note (Signed)
Transition of Care [facility]) - CM/SW Discharge Note  Patient Details  Name: [name] MRN: [id number] Date of Birth: March 18, 1935  Transition of Care [facility]) CM/SW Contact:  Ewing Schlein, LCSW Phone Number: 12/12/2022, 12:47 PM  Clinical Narrative: [facility] to discharge to [facility] to resume hospice under [facility]. FL2 updated per facilities request with discharge medications. CSW spoke with Dominque at [facility] and left a VM for Deno Lunger after the [facility] and discharge summary were faxed [phone]) to the facility. Medical necessity form done; [facility] scheduled. Daughter notified of transportation being set up. Discharge packet completed. LPN updated.   Final next level of care: Assisted Living Barriers to Discharge: Barriers Resolved  Patient Goals and CMS Choice Choice offered to / list presented to : NA  Discharge Placement      Patient chooses bed at: Other - please specify in the comment section below: ([facility]) Patient to be transferred to facility by: [facility] Name of family member notified: Phoebe Sharps (daughter) Patient and family notified of of transfer: 12/12/22  Discharge Plan and Services Additional resources added to the After Visit Summary for   In-house Referral: Clinical Social Work Post Acute Care Choice: Hospice          DME Arranged: N/A DME Agency: NA  Social Determinants of Health (SDOH) Interventions SDOH Screenings   Food Insecurity: No Food Insecurity (12/08/2022)  Housing: Low Risk  (12/08/2022)  Transportation Needs: No Transportation Needs (12/08/2022)  Utilities: Not At Risk (12/08/2022)  Alcohol Screen: Low Risk  (04/04/2021)  Depression (PHQ2-9): Low Risk  (04/04/2021)  Financial Resource Strain: Low Risk  (04/04/2021)  Physical Activity: Inactive (04/04/2021)  Social Connections: Socially Integrated (04/04/2021)  Stress: No Stress Concern Present (04/04/2021)  Tobacco Use: Low Risk  (12/11/2022)   Readmission Risk  Interventions     No data to display

## 2022-12-12 NOTE — Progress Notes (Signed)
Second attempt to call [name] of [facility] was made to call for report. There was no answer. [facility] is on the way with patient. Patient is stable for transport. Left a voicemail with name and call back number.

## 2022-12-12 NOTE — Progress Notes (Signed)
Attempted to call to give report at [facility]. Voicemail with name and call back number was given.

## 2022-12-12 NOTE — Evaluation (Signed)
Clinical/Bedside Swallow Evaluation Patient Details  Name: [name] MRN: [id number] Date of Birth: [birth year]  Today's Date: 12/12/2022 Time: SLP Start Time (ACUTE ONLY): 0840 SLP Stop Time (ACUTE ONLY): 0910 SLP Time Calculation (min) (ACUTE ONLY): 30 min  Past Medical History:  Past Medical History:  Diagnosis Date   Allergy    Anemia    Cancer of left breast (HCC) 1978   CHF (congestive heart failure) (HCC)    Chronic thoracic back pain    "T8; fracture; 03/2015; no OR" (05/05/2016)   GERD (gastroesophageal reflux disease)    Heart murmur    History of hiatal hernia    Hyperlipidemia    Hyperplastic colon polyp    Hypertension    IBS (irritable bowel syndrome)    Multiple thyroid nodules    Osteoporosis    T8 compression fx 03/2015    Personal history of radiation therapy    Presence of permanent cardiac pacemaker    Vitamin B12 deficiency    Past Surgical History:  Past Surgical History:  Procedure Laterality Date   ANTERIOR CERVICAL DECOMP/DISCECTOMY FUSION  2008   C5-6   AUGMENTATION MAMMAPLASTY     BACK SURGERY     BREAST SURGERY     CARDIAC CATHETERIZATION  2001   CORONARY PRESSURE/FFR STUDY N/A 10/07/2019   Procedure: INTRAVASCULAR PRESSURE WIRE/FFR STUDY;  Surgeon: Corky Crafts, MD;  Location: [facility] INVASIVE CV LAB;  Service: Cardiovascular;  Laterality: N/A;   EP IMPLANTABLE DEVICE N/A 05/05/2016   Procedure: Pacemaker Implant;  Surgeon: Marinus Maw, MD;  Location: [facility] INVASIVE CV LAB;  Service: Cardiovascular;  Laterality: N/A;   EXCISIONAL HEMORRHOIDECTOMY  1984   With subsequent correction of surgery   INSERT / REPLACE / REMOVE PACEMAKER     KNEE ARTHROSCOPY Left    "meniscus tear"   LEFT HEART CATH AND CORONARY ANGIOGRAPHY N/A 10/07/2019   Procedure: LEFT HEART CATH AND CORONARY ANGIOGRAPHY;  Surgeon: Corky Crafts, MD;  [facility] IN[facility] CV LAB;  Service: Cardiovascular;  Laterality: N/A;   MASTECTOMY Left 1978   PLACEMENT OF  BREAST IMPLANTS Left 1981   REDUCTION MAMMAPLASTY Right    SHOULDER ARTHROSCOPY W/ ROTATOR CUFF REPAIR Left 02/2006   "tear"   TOTAL HIP ARTHROPLASTY Right 12/10/2022   Procedure: TOTAL HIP ARTHROPLASTY ANTERIOR APPROACH;  Surgeon: Samson Frederic, MD;  Location: [facility] ORS;  Service: Orthopedics;  Laterality: Right;   TUBAL LIGATION     HPI:  Pt is an 88 year old female with past medical history significant of hypertension, Parkinson disease, hyperlipidemia, anxiety, sinus node dysfunction s/p PPM, diastolic congestive heart failure, CKD stage IV, GAD/depression, hyperlipidemia, GERD, ACDF, who presented to [facility] ED on 5/13 from her facility [name] [place] via EMS after she was found on the floor of her room at her facility.  Workup in the ED with noted right hip fracture and urinalysis concerning for UTI.  Pt s/p R THA direct anterior approach on 12/10/22.PAtient has a h/o dyphagia. Most recently seen in 2023 at bedside with function consistent with results of  MBS completed August of 2022, patient with swallow initiation delays which were lessened when nectar thick, puree or regular solids were swallowed as compared to thin liquids. REcommended nectar thick liquids at that time.    Assessment / Plan / Recommendation  Clinical Impression  Patient presents with evidence of a mild oropharyngeal dysphagia relatively  similar in presentation to during recent visits. She presents with prolonged but deliberate  and functional mastication of regular texture solids due to generalized oral weakness and tremors, as well as cough post swallow of thin liquids likely due to delay in swallow initiation. When straw removed and patient consumed thin liquids via small cup sips, no overt indication of aspiration observed. Discussed with patient and daughter via phone options for thin liquids via cup sip vs thickening of liquids as recommended in the past. At this time, particularly given clear lungs and no  recent h/o PNA/lung infection, patient appears to be tolerating thin liquids from a pulmonary standpoint. Suspect that if she does intermittently penetration or aspirate, which is possible, cough is effective to clear. Recommend continuation of current diet with aspiration precautions. Should coughing become more frequent in nature even without use of straws, may wish to consider repeat instrumental testing and/or downgrade to nectar thick liquids. SLP will f/u for tolerance and education as well as to reinforce use of precautions. SLP Visit Diagnosis: Dysphagia, oropharyngeal phase (R13.12)    Aspiration Risk  Moderate aspiration risk    Diet Recommendation Regular;Thin liquid   Liquid Administration via: Cup;No straw Medication Administration: Whole meds with puree Supervision: Patient able to self feed;Intermittent supervision to cue for compensatory strategies Compensations: Slow rate;Small sips/bites Postural Changes: Seated upright at 90 degrees    Other  Recommendations Oral Care Recommendations: Oral care BID    Recommendations for follow up therapy are one component of a multi-disciplinary discharge planning process, led by the attending physician.  Recommendations may be updated based on patient status, additional functional criteria and insurance authorization.  Follow up Recommendations Home health SLP         Functional Status Assessment Patient has had a recent decline in their functional status and demonstrates the ability to make significant improvements in function in a reasonable and predictable amount of time.  Frequency and Duration min 2x/week  1 week        Swallow Study   General HPI: Pt is an 88 year old female with past medical history significant of hypertension, Parkinson disease, hyperlipidemia, anxiety, sinus node dysfunction s/p PPM, diastolic congestive heart failure, CKD stage IV, GAD/depression, hyperlipidemia, GERD, ACDF, who presented to [facility] ED on 5/13 from her facility [facility] [place] via EMS after she was found on the floor of her room at her facility.  Workup in the ED with noted right hip fracture and urinalysis concerning for UTI.  Pt s/p R THA direct anterior approach on 12/10/22.PAtient has a h/o dyphagia. Most recently seen in 2023 at bedside with function consistent with results of  MBS completed August of 2022, patient with swallow initiation delays which were lessened when nectar thick, puree or regular solids were swallowed as compared to thin liquids. REcommended nectar thick liquids at that time. Type of Study: Bedside Swallow Evaluation Previous Swallow Assessment: see HPI Diet Prior to this Study: Regular;Thin liquids (Level 0) Temperature Spikes Noted: No Respiratory Status: Room air History of Recent Intubation: No Behavior/Cognition: Alert;Cooperative;Pleasant mood Oral Cavity Assessment: Dry Oral Care Completed by SLP: Recent completion by staff Oral Cavity - Dentition: Adequate natural dentition Vision: Functional for self-feeding Self-Feeding Abilities: Able to feed self;Able to feed self with adaptive devices;Needs assist Patient Positioning: Upright in bed Baseline Vocal Quality: Low vocal intensity Volitional Cough: Strong Volitional Swallow: Able to elicit    Oral/Motor/Sensory Function Overall Oral Motor/Sensory Function: Generalized oral weakness   Ice Chips Ice chips: Not tested   Thin Liquid Thin Liquid: Impaired Presentation: Straw;Cup;Self  Fed Pharyngeal  Phase Impairments: Suspected delayed Swallow;Cough - Immediate Other Comments: eliminated with small cup sips    Nectar Thick Nectar Thick Liquid: Not tested   Honey Thick Honey Thick Liquid: Not tested   Puree Puree: Not tested   Solid     Solid: Within functional limits Presentation: Self Fed     Dimetrius Montfort MA, CCC-SLP  Tevis Conger Meryl 12/12/2022,9:16 AM

## 2022-12-12 NOTE — Discharge Summary (Signed)
Physician Discharge Summary  [name] WGN:[id number] DOB: July 18, 1935 DOA: 12/08/2022  PCP: Pcp, No  Admit date: 12/08/2022 Discharge date: 12/12/2022 30 Day Unplanned Readmission Risk Score    Flowsheet Row ED to Hosp-Admission (Current) from 12/08/2022 in [facility]  30 Day Unplanned Readmission Risk Score (%) 16.68 Filed at 12/12/2022 0801       This score is the patient's risk of an unplanned readmission within 30 days of being discharged (0 -100%). The score is based on dignosis, age, lab data, medications, orders, and past utilization.   Low:  0-14.9   Medium: 15-21.9   High: 22-29.9   Extreme: 30 and above          Admitted From: Assisted living facility/memory care Disposition: Assisted living facility/memory care  Recommendations for Outpatient Follow-up:  Follow up with PCP in 1-2 weeks Please obtain BMP/CBC in one week Follow-up with orthopedics in 2 weeks Please follow up with your PCP on the following pending results: Unresulted Labs (From admission, onward)     Start     Ordered   12/12/22 0500  Basic metabolic panel  Daily,   R      12/11/22 1050              Home Health: None Equipment/Devices: None  Discharge Condition: Stable CODE STATUS: DNR Diet recommendation: Cardiac  Subjective: Seen and examined.  She has no complaints.  She is alert and oriented to herself and place.  Brief/Interim Summary: [name] is a 88 y.o. female with past medical history significant for dementia, parkinsonism, essential hypertension, sinus node dysfunction s/p PPM, diastolic congestive heart failure, CKD stage IV, GAD/depression, hyperlipidemia, GERD who presented to [facility] ED on 5/13 from her facility [place] [facility] [place] via EMS after she was found on the floor of her room at her facility.  Workup in the ED with noted right hip fracture and urinalysis concerning for UTI.  CT head/C-spine unrevealing.  Orthopedics was  consulted and [facility] consulted for admission for further evaluation management. patient underwent right total hip arthroplasty anterior approach by Dr. Linna Caprice on 12/10/2022.  Her pain is fairly controlled.  Orthopedics has cleared her for discharge and recommended aspirin for DVT prophylaxis.  She was seen by PT OT who recommended SNF.  She is going back to her memory care unit and per [facility], they can accommodate her needs.   Pseudomonas urinary tract infection -- transitioned cefepime to ciprofloxacin 5 mg p.o. twice daily to complete 5-day course, she will be discharged on 4 more days.   Essential hypertension Sinus node dysfunction s/p PPM Chronic diastolic congestive heart failure, compensated Follows with cardiology outpatient.  EKG on admission with sinus tachycardia, rate 107. -- Not on any antihypertensives or rate controlling medications at baseline -- Started on amlodipine 5 mg p.o. daily for elevated blood pressure   CKD stage IV ruled out Patient's main is within normal limit.  She does not appear to have any CKD.   GAD/depression -- Lexapro 10 mg p.o. daily   Hyperlipidemia -- Currently not on any medication outpatient   GERD -- Protonix 40 mg p.o. twice daily substituted for home omeprazole   Dementia, parkinsonism Follows with neurology outpatient, Dr. Arbutus Leas.  Also follows with hospice outpatient.  Discharge Diagnoses:  Principal Problem:   Closed right hip fracture, initial encounter [facility]) Active Problems:   GERD (gastroesophageal reflux disease)   Prediabetes   (HFpEF) heart failure with preserved ejection fraction (HCC)  Stage 3b chronic kidney disease (CKD) (HCC)    Discharge Instructions   Allergies as of 12/12/2022       Reactions   Statins Other (See Comments)   Lipitor caused hospitalization - - depletion of electrolytes, fever, nausea, loss of appetite, couldn't get out of bed   Cymbalta [duloxetine Hcl] Other (See Comments)   Dulled her too much,  difficulty urinating, change in vision- "Allergic," per [facility]   Fosamax [alendronate Sodium] Other (See Comments)   Caused chronic issues swallowing   Amitriptyline Other (See Comments)   Hallucinations   Fosamax [alendronate] Other (See Comments)   "Allergic," per [name]   Lyrica [pregabalin] Other (See Comments)   "Allergic," per [name]   Ranitidine Other (See Comments)   "Allergic," per [name]   Neurontin [gabapentin] Other (See Comments)   Dizziness and sedation (patient is tolerating in lower dose)- "Allergic," per [name]   Zanaflex [tizanidine] Other (See Comments)   Could not sleep        Medication List     STOP taking these medications    busPIRone 10 MG tablet Commonly known as: BUSPAR   ezetimibe 10 MG tablet Commonly known as: ZETIA       TAKE these medications    acetaminophen 500 MG tablet Commonly known as: TYLENOL Take 500 mg by mouth every 8 (eight) hours as needed for mild pain.   amLODipine 5 MG tablet Commonly known as: NORVASC Take 1 tablet (5 mg total) by mouth daily.   aspirin 81 MG chewable tablet Commonly known as: Aspirin Childrens Chew 1 tablet (81 mg total) by mouth 2 (two) times daily with a meal.   ciprofloxacin 500 MG tablet Commonly known as: CIPRO Take 1 tablet (500 mg total) by mouth 2 (two) times daily for 4 days.   cyanocobalamin 1000 MCG tablet Commonly known as: VITAMIN B12 Take 1,000 mcg by mouth daily.   escitalopram 10 MG tablet Commonly known as: Lexapro Take one tablet daily   HYDROcodone-acetaminophen 5-325 MG tablet Commonly known as: NORCO/VICODIN Take 1 tablet by mouth every 4 (four) hours as needed for up to 7 days for moderate pain or severe pain.   LORazepam 0.5 MG tablet Commonly known as: ATIVAN Take 0.5 mg by mouth 3 (three) times daily between meals as needed for anxiety.   omeprazole 40 MG capsule Commonly known as: PRILOSEC TAKE 1 CAPSULE BY MOUTH TWICE A DAY FOR GERD. What changed:  how much to take how  to take this when to take this additional instructions   polyethylene glycol 17 g packet Commonly known as: MIRALAX / GLYCOLAX Take 17 g by mouth daily as needed (constipation- MIX AND DRINK AS DIRECTED).   senna-docusate 8.6-50 MG tablet Commonly known as: Senokot-S Take 1 tablet by mouth 2 (two) times daily between meals as needed for mild constipation.   traMADol 50 MG tablet Commonly known as: ULTRAM Take 25 mg by mouth 3 (three) times daily.   Wheelchair Misc Power Wheelchair        Follow-up Information     [place], Alain Honey, [place]. Schedule an appointment as soon as possible for a visit in 2 week(s).   Specialty: Orthopedic Surgery Why: For suture removal, For wound re-check Contact information: [address], [address] [place] [address] [phone]                Allergies  Allergen Reactions   Statins Other (See Comments)    Lipitor caused hospitalization - - depletion of  electrolytes, fever, nausea, loss of appetite, couldn't get out of bed   Cymbalta [Duloxetine Hcl] Other (See Comments)    Dulled her too much, difficulty urinating, change in vision- "Allergic," per [facility]   Fosamax [Alendronate Sodium] Other (See Comments)    Caused chronic issues swallowing   Amitriptyline Other (See Comments)    Hallucinations    Fosamax [Alendronate] Other (See Comments)    "Allergic," per [name]   Lyrica [Pregabalin] Other (See Comments)    "Allergic," per [name]   Ranitidine Other (See Comments)    "Allergic," per [name]   Neurontin [Gabapentin] Other (See Comments)    Dizziness and sedation (patient is tolerating in lower dose)- "Allergic," per [name]   Zanaflex [Tizanidine] Other (See Comments)    Could not sleep    Consultations: Orthopedics   Procedures/Studies: DG Pelvis Portable  Result Date: 12/10/2022 CLINICAL DATA:  Postoperative check.  Post right hip surgery EXAM: PORTABLE PELVIS 1-2 VIEWS COMPARISON:  05/17/2022 FINDINGS: Postoperative changes with  right total hip arthroplasty using non cemented components. Components appear well seated. No evidence of acute fracture or dislocation. No focal bone lesions. Skin clips and soft tissue gas are consistent with recent surgery. IMPRESSION: Non cemented right hip arthroplasty. Components appear well seated. No acute bony abnormalities. Electronically Signed   By: Burman Nieves M.D.   On: 12/10/2022 17:26   DG HIP UNILAT WITH PELVIS 1V RIGHT  Result Date: 12/10/2022 CLINICAL DATA:  [id number] Surgery, elective [id number] EXAM: DG HIP (WITH OR WITHOUT PELVIS) 1V RIGHT COMPARISON:  None Available. FINDINGS: Intraoperative images during right hip arthroplasty. Normal alignment. No evidence of loosening or periprosthetic fracture. Expected soft tissue changes. IMPRESSION: Intraoperative images during right hip arthroplasty. Normal alignment. No evidence of immediate hardware complication. Electronically Signed   By: Caprice Renshaw M.D.   On: 12/10/2022 16:07   DG C-Arm 1-60 Min-No Report  Result Date: 12/10/2022 Fluoroscopy was utilized by the requesting physician.  No radiographic interpretation.   DG C-Arm 1-60 Min-No Report  Result Date: 12/10/2022 Fluoroscopy was utilized by the requesting physician.  No radiographic interpretation.   DG Knee Right Port  Result Date: 12/09/2022 CLINICAL DATA:  Closed displaced fracture of right femoral neck. EXAM: PORTABLE RIGHT KNEE - 1-2 VIEW COMPARISON:  Frontal view of the bilateral knees 01/15/2021, right knee radiographs 08/03/2019 FINDINGS: There is minimal medial compartment joint space narrowing. Mild peripheral medial tibial plateau degenerative osteophytosis. No joint effusion. Unchanged cortical thickening that may represent a possible old healed fracture versus chronic stress changes of the proximal diaphysis of the fibula, unchanged from prior. Mild benign-appearing chronic periosteal reaction at the medial aspect of the proximal tibial metadiaphysis is slightly  increased from prior and may represent the sequela of chronic stress changes and/or remote trauma. No acute fracture or dislocation. IMPRESSION: 1. Minimal medial compartment osteoarthritis. 2. No acute fracture. Electronically Signed   By: Neita Garnet M.D.   On: 12/09/2022 08:46   CT HEAD WO CONTRAST ( )  Result Date: 12/08/2022 CLINICAL DATA:  Head trauma, minor (Age >= 65y); Polytrauma, blunt. Found down/fall. Recent headache. EXAM: CT HEAD WITHOUT CONTRAST CT CERVICAL SPINE WITHOUT CONTRAST TECHNIQUE: Multidetector CT imaging of the head and cervical spine was performed following the standard protocol without intravenous contrast. Multiplanar CT image reconstructions of the cervical spine were also generated. RADIATION DOSE REDUCTION: This exam was performed according to the departmental dose-optimization program which includes automated exposure control, adjustment of the mA and/or kV according to patient size and/or use  of iterative reconstruction technique. COMPARISON:  CT head, cervical spine, and thoracic spine 05/17/2022 FINDINGS: CT HEAD FINDINGS Brain: There is no evidence of an acute infarct, intracranial hemorrhage, mass, midline shift, or extra-axial fluid collection. Mild cerebral atrophy is within normal limits for age. Cerebral white matter hypodensities are unchanged and nonspecific but compatible with mild chronic small vessel ischemic disease. Vascular: Calcified atherosclerosis at the skull base. No hyperdense vessel. Skull: No acute fracture or suspicious osseous lesion. Sinuses/Orbits: Paranasal sinuses and mastoid air cells are clear. Bilateral cataract extraction. Other: None. CT CERVICAL SPINE FINDINGS Alignment: Chronic trace retrolisthesis of C3 on C4 and trace anterolisthesis of C7 on T1. Skull base and vertebrae: No acute fracture or suspicious osseous lesion. Mild chronic C7 inferior and T2 superior endplate compression fractures, also present on the comparison studies. Solid  C5-6 ACDF. Soft tissues and spinal canal: No prevertebral fluid or swelling. No visible canal hematoma. Disc levels: Mild to moderate disc degeneration at C6-7. No evidence of high-grade stenosis. Upper chest: No mass or consolidation in the included lung apices. Other: Mild calcified atherosclerosis at the carotid bifurcations. IMPRESSION: 1. No evidence of acute intracranial abnormality. 2. No acute cervical spine fracture. Electronically Signed   By: Sebastian Ache M.D.   On: 12/08/2022 11:08   CT CERVICAL SPINE WO CONTRAST  Result Date: 12/08/2022 CLINICAL DATA:  Head trauma, minor (Age >= 65y); Polytrauma, blunt. Found down/fall. Recent headache. EXAM: CT HEAD WITHOUT CONTRAST CT CERVICAL SPINE WITHOUT CONTRAST TECHNIQUE: Multidetector CT imaging of the head and cervical spine was performed following the standard protocol without intravenous contrast. Multiplanar CT image reconstructions of the cervical spine were also generated. RADIATION DOSE REDUCTION: This exam was performed according to the departmental dose-optimization program which includes automated exposure control, adjustment of the mA and/or kV according to patient size and/or use of iterative reconstruction technique. COMPARISON:  CT head, cervical spine, and thoracic spine 05/17/2022 FINDINGS: CT HEAD FINDINGS Brain: There is no evidence of an acute infarct, intracranial hemorrhage, mass, midline shift, or extra-axial fluid collection. Mild cerebral atrophy is within normal limits for age. Cerebral white matter hypodensities are unchanged and nonspecific but compatible with mild chronic small vessel ischemic disease. Vascular: Calcified atherosclerosis at the skull base. No hyperdense vessel. Skull: No acute fracture or suspicious osseous lesion. Sinuses/Orbits: Paranasal sinuses and mastoid air cells are clear. Bilateral cataract extraction. Other: None. CT CERVICAL SPINE FINDINGS Alignment: Chronic trace retrolisthesis of C3 on C4 and trace  anterolisthesis of C7 on T1. Skull base and vertebrae: No acute fracture or suspicious osseous lesion. Mild chronic C7 inferior and T2 superior endplate compression fractures, also present on the comparison studies. Solid C5-6 ACDF. Soft tissues and spinal canal: No prevertebral fluid or swelling. No visible canal hematoma. Disc levels: Mild to moderate disc degeneration at C6-7. No evidence of high-grade stenosis. Upper chest: No mass or consolidation in the included lung apices. Other: Mild calcified atherosclerosis at the carotid bifurcations. IMPRESSION: 1. No evidence of acute intracranial abnormality. 2. No acute cervical spine fracture. Electronically Signed   By: Sebastian Ache M.D.   On: 12/08/2022 11:08   DG Hip Unilat W or Wo Pelvis 2-3 Views Right  Result Date: 12/08/2022 CLINICAL DATA:  Fall EXAM: DG HIP (WITH OR WITHOUT PELVIS) 2-3V RIGHT COMPARISON:  12/16/2013 FINDINGS: Acute fracture of the right femoral neck, which appears predominantly subcapital in location. There is fracture shortening and varus angulation. No evidence of fracture extension to the intertrochanteric region. Hip joint alignment is  maintained without dislocation. Mild to moderate diffuse right hip joint space narrowing. IMPRESSION: Acute fracture of the right femoral neck. Electronically Signed   By: Duanne Guess D.O.   On: 12/08/2022 10:34   DG Chest Portable 1 View  Result Date: 12/08/2022 CLINICAL DATA:  Fall EXAM: PORTABLE CHEST 1 VIEW COMPARISON:  05/17/2022 FINDINGS: Right-sided implanted cardiac device remains in place. Stable heart size. Aortic and mitral annulus calcification. No focal airspace consolidation, pleural effusion, or pneumothorax. Bones are demineralized. No acute bony abnormalities are identified. IMPRESSION: No active disease. Electronically Signed   By: Duanne Guess D.O.   On: 12/08/2022 10:33     Discharge Exam: Vitals:   12/11/22 2114 12/12/22 0530  BP: (!) 126/51 (!) 143/70  Pulse:  76 81  Resp: 15 15  Temp: 98.2 F (36.8 C) 97.7 F (36.5 C)  SpO2: 97% 98%   Vitals:   12/11/22 0556 12/11/22 1316 12/11/22 2114 12/12/22 0530  BP: (!) 175/65 (!) 121/52 (!) 126/51 (!) 143/70  Pulse: 89 86 76 81  Resp: 15 16 15 15   Temp: 98.7 F (37.1 C) 97.9 F (36.6 C) 98.2 F (36.8 C) 97.7 F (36.5 C)  TempSrc:  Oral Oral   SpO2:  100% 97% 98%  Weight:      Height:        General: Pt is alert, awake, not in acute distress Cardiovascular: RRR, S1/S2 +, no rubs, no gallops Respiratory: CTA bilaterally, no wheezing, no rhonchi Abdominal: Soft, NT, ND, bowel sounds + Extremities: no edema, no cyanosis    The results of significant diagnostics from this hospitalization (including imaging, microbiology, ancillary and laboratory) are listed below for reference.     Microbiology: Recent Results (from the past 240 hour(s))  Urine Culture (for pregnant, neutropenic or urologic patients or patients with an indwelling urinary catheter)     Status: Abnormal   Collection Time: 12/08/22  1:45 PM   Specimen: Urine, Clean Catch  Result Value Ref Range Status   Specimen Description   Final    URINE, CLEAN CATCH Performed at [facility], [address], [place], [place] [address]    Special Requests   Final    NONE Performed at [facility], [address] [place], [place] [address]    Culture >=100,000 COLONIES/mL PSEUDOMONAS AERUGINOSA (A)  Final   Report Status 12/11/2022 FINAL  Final   Organism ID, Bacteria PSEUDOMONAS AERUGINOSA (A)  Final      Susceptibility   Pseudomonas aeruginosa - MIC*    CEFTAZIDIME <=1 SENSITIVE Sensitive     CIPROFLOXACIN <=0.25 SENSITIVE Sensitive     GENTAMICIN <=1 SENSITIVE Sensitive     IMIPENEM <=0.25 SENSITIVE Sensitive     PIP/TAZO <=4 SENSITIVE Sensitive     CEFEPIME 0.5 SENSITIVE Sensitive     * >=100,000 COLONIES/mL PSEUDOMONAS AERUGINOSA  Surgical pcr screen     Status: None   Collection  Time: 12/09/22  3:12 PM   Specimen: Nasal Mucosa; Nasal Swab  Result Value Ref Range Status   MRSA, PCR NEGATIVE NEGATIVE Final   Staphylococcus aureus NEGATIVE NEGATIVE Final    Comment: (NOTE) The Xpert SA Assay (FDA approved for NASAL specimens in patients 18 years of age and older), is one component of a comprehensive surveillance program. It is not intended to diagnose infection nor to guide or monitor treatment. Performed at [facility], [address], [place], [place] [address]      Labs: BNP (last  3 results) No results for input(s): "BNP" in the last 8760 hours. Basic Metabolic Panel: Recent Labs  Lab 12/08/22 1010 12/09/22 0350 12/10/22 0335 12/11/22 0339 12/12/22 0349  NA 138 136 135 133* 137  K 3.7 3.4* 4.0 4.0 3.6  CL 104 105 105 105 109  CO2 23 22 23  19* 20*  GLUCOSE 117* 110* 110* 136* 102*  BUN 22 16 17 21 21   CREATININE 0.95 0.75 0.80 0.87 0.79  CALCIUM 9.2 8.5* 8.3* 7.9* 8.0*  MG  --   --  1.8  --   --    Liver Function Tests: No results for input(s): "AST", "ALT", "ALKPHOS", "BILITOT", "PROT", "ALBUMIN" in the last 168 hours. No results for input(s): "LIPASE", "AMYLASE" in the last 168 hours. No results for input(s): "AMMONIA" in the last 168 hours. CBC: Recent Labs  Lab 12/08/22 1010 12/09/22 0350 12/10/22 0335 12/11/22 0339 12/12/22 0349  WBC 17.5* 10.2 9.9 15.5* 9.7  NEUTROABS 15.5*  --   --   --   --   HGB 12.0 10.8* 11.0* 10.6* 8.8*  HCT 36.5 32.9* 33.8* 31.2* 27.1*  MCV 98.1 97.1 96.3 95.1 98.9  PLT 310 263 268 246 248   Cardiac Enzymes: No results for input(s): "CKTOTAL", "CKMB", "CKMBINDEX", "TROPONINI" in the last 168 hours. BNP: Invalid input(s): "POCBNP" CBG: Recent Labs  Lab 12/08/22 0954  GLUCAP 111*   D-Dimer No results for input(s): "DDIMER" in the last 72 hours. Hgb A1c No results for input(s): "HGBA1C" in the last 72 hours. Lipid Profile No results for input(s): "CHOL", "HDL", "LDLCALC",  "TRIG", "CHOLHDL", "LDLDIRECT" in the last 72 hours. Thyroid function studies No results for input(s): "TSH", "T4TOTAL", "T3FREE", "THYROIDAB" in the last 72 hours.  Invalid input(s): "FREET3" Anemia work up No results for input(s): "VITAMINB12", "FOLATE", "FERRITIN", "TIBC", "IRON", "RETICCTPCT" in the last 72 hours. Urinalysis    Component Value Date/Time   COLORURINE YELLOW 12/08/2022 1345   APPEARANCEUR CLEAR 12/08/2022 1345   LABSPEC 1.018 12/08/2022 1345   PHURINE 5.0 12/08/2022 1345   GLUCOSEU NEGATIVE 12/08/2022 1345   HGBUR MODERATE (A) 12/08/2022 1345   BILIRUBINUR NEGATIVE 12/08/2022 1345   KETONESUR 20 (A) 12/08/2022 1345   PROTEINUR NEGATIVE 12/08/2022 1345   NITRITE POSITIVE (A) 12/08/2022 1345   LEUKOCYTESUR NEGATIVE 12/08/2022 1345   Sepsis Labs Recent Labs  Lab 12/09/22 0350 12/10/22 0335 12/11/22 0339 12/12/22 0349  WBC 10.2 9.9 15.5* 9.7   Microbiology Recent Results (from the past 240 hour(s))  Urine Culture (for pregnant, neutropenic or urologic patients or patients with an indwelling urinary catheter)     Status: Abnormal   Collection Time: 12/08/22  1:45 PM   Specimen: Urine, Clean Catch  Result Value Ref Range Status   Specimen Description   Final    URINE, CLEAN CATCH Performed at [facility], [address] [place], [place] [address]    Special Requests   Final    NONE Performed at [facility], [address], [place], [place] [address]    Culture >=100,000 COLONIES/mL PSEUDOMONAS AERUGINOSA (A)  Final   Report Status 12/11/2022 FINAL  Final   Organism ID, Bacteria PSEUDOMONAS AERUGINOSA (A)  Final      Susceptibility   Pseudomonas aeruginosa - MIC*    CEFTAZIDIME <=1 SENSITIVE Sensitive     CIPROFLOXACIN <=0.25 SENSITIVE Sensitive     GENTAMICIN <=1 SENSITIVE Sensitive     IMIPENEM <=0.25 SENSITIVE Sensitive     PIP/TAZO <=4 SENSITIVE Sensitive  CEFEPIME 0.5 SENSITIVE Sensitive     *  >=100,000 COLONIES/mL PSEUDOMONAS AERUGINOSA  Surgical pcr screen     Status: None   Collection Time: 12/09/22  3:12 PM   Specimen: Nasal Mucosa; Nasal Swab  Result Value Ref Range Status   MRSA, PCR NEGATIVE NEGATIVE Final   Staphylococcus aureus NEGATIVE NEGATIVE Final    Comment: (NOTE) The Xpert SA Assay (FDA approved for NASAL specimens in patients 45 years of age and older), is one component of a comprehensive surveillance program. It is not intended to diagnose infection nor to guide or monitor treatment. Performed at [facility], [address] [place], [place] [address]      Time coordinating discharge: Over 30 minutes  SIGNED:   Hughie Closs, MD  [facility] 12/12/2022, 8:22 AM *Please note that this is a verbal dictation therefore any spelling or grammatical errors are due to the "[employer]agon [employer] One" system interpretation. If 7PM-7AM, please contact night-coverage [email or web address]

## 2022-12-14 LAB — BPAM RBC
Blood Product Expiration Date: 202406072359
Blood Product Expiration Date: 202406072359
Unit Type and Rh: 6200

## 2022-12-14 LAB — TYPE AND SCREEN
ABO/RH(D): A POS
Unit division: 0
Unit division: 0

## 2022-12-15 NOTE — Telephone Encounter (Signed)
Spoke to patients daughter ~ Lynden Ang, advised to keep alert button greater than 6 inches from pacemaker. Advised not to allow button over pacemaker itself. Voiced understanding and appreciative of return call.

## 2022-12-16 DIAGNOSIS — N1832 Chronic kidney disease, stage 3b: Secondary | ICD-10-CM | POA: Diagnosis not present

## 2022-12-16 DIAGNOSIS — G20A1 Parkinson's disease without dyskinesia, without mention of fluctuations: Secondary | ICD-10-CM | POA: Diagnosis not present

## 2022-12-16 DIAGNOSIS — F0284 Dementia in other diseases classified elsewhere, unspecified severity, with anxiety: Secondary | ICD-10-CM | POA: Diagnosis not present

## 2022-12-16 DIAGNOSIS — F0283 Dementia in other diseases classified elsewhere, unspecified severity, with mood disturbance: Secondary | ICD-10-CM | POA: Diagnosis not present

## 2022-12-16 DIAGNOSIS — I509 Heart failure, unspecified: Secondary | ICD-10-CM | POA: Diagnosis not present

## 2022-12-16 DIAGNOSIS — I130 Hypertensive heart and chronic kidney disease with heart failure and stage 1 through stage 4 chronic kidney disease, or unspecified chronic kidney disease: Secondary | ICD-10-CM | POA: Diagnosis not present

## 2022-12-18 DIAGNOSIS — I130 Hypertensive heart and chronic kidney disease with heart failure and stage 1 through stage 4 chronic kidney disease, or unspecified chronic kidney disease: Secondary | ICD-10-CM | POA: Diagnosis not present

## 2022-12-18 DIAGNOSIS — N1832 Chronic kidney disease, stage 3b: Secondary | ICD-10-CM | POA: Diagnosis not present

## 2022-12-18 DIAGNOSIS — I509 Heart failure, unspecified: Secondary | ICD-10-CM | POA: Diagnosis not present

## 2022-12-18 DIAGNOSIS — F0284 Dementia in other diseases classified elsewhere, unspecified severity, with anxiety: Secondary | ICD-10-CM | POA: Diagnosis not present

## 2022-12-18 DIAGNOSIS — F0283 Dementia in other diseases classified elsewhere, unspecified severity, with mood disturbance: Secondary | ICD-10-CM | POA: Diagnosis not present

## 2022-12-18 DIAGNOSIS — G20A1 Parkinson's disease without dyskinesia, without mention of fluctuations: Secondary | ICD-10-CM | POA: Diagnosis not present

## 2022-12-23 DIAGNOSIS — F0283 Dementia in other diseases classified elsewhere, unspecified severity, with mood disturbance: Secondary | ICD-10-CM | POA: Diagnosis not present

## 2022-12-23 DIAGNOSIS — S72031D Displaced midcervical fracture of right femur, subsequent encounter for closed fracture with routine healing: Secondary | ICD-10-CM | POA: Diagnosis not present

## 2022-12-23 DIAGNOSIS — G20A1 Parkinson's disease without dyskinesia, without mention of fluctuations: Secondary | ICD-10-CM | POA: Diagnosis not present

## 2022-12-23 DIAGNOSIS — I130 Hypertensive heart and chronic kidney disease with heart failure and stage 1 through stage 4 chronic kidney disease, or unspecified chronic kidney disease: Secondary | ICD-10-CM | POA: Diagnosis not present

## 2022-12-23 DIAGNOSIS — R7989 Other specified abnormal findings of blood chemistry: Secondary | ICD-10-CM | POA: Diagnosis not present

## 2022-12-23 DIAGNOSIS — I509 Heart failure, unspecified: Secondary | ICD-10-CM | POA: Diagnosis not present

## 2022-12-23 DIAGNOSIS — N1832 Chronic kidney disease, stage 3b: Secondary | ICD-10-CM | POA: Diagnosis not present

## 2022-12-23 DIAGNOSIS — F0284 Dementia in other diseases classified elsewhere, unspecified severity, with anxiety: Secondary | ICD-10-CM | POA: Diagnosis not present

## 2022-12-24 DIAGNOSIS — G231 Progressive supranuclear ophthalmoplegia [Steele-Richardson-Olszewski]: Secondary | ICD-10-CM | POA: Diagnosis not present

## 2022-12-24 DIAGNOSIS — I5032 Chronic diastolic (congestive) heart failure: Secondary | ICD-10-CM | POA: Diagnosis not present

## 2022-12-24 DIAGNOSIS — S72001D Fracture of unspecified part of neck of right femur, subsequent encounter for closed fracture with routine healing: Secondary | ICD-10-CM | POA: Diagnosis not present

## 2022-12-24 DIAGNOSIS — G903 Multi-system degeneration of the autonomic nervous system: Secondary | ICD-10-CM | POA: Diagnosis not present

## 2022-12-24 DIAGNOSIS — I495 Sick sinus syndrome: Secondary | ICD-10-CM | POA: Diagnosis not present

## 2022-12-24 DIAGNOSIS — I110 Hypertensive heart disease with heart failure: Secondary | ICD-10-CM | POA: Diagnosis not present

## 2022-12-24 DIAGNOSIS — Z950 Presence of cardiac pacemaker: Secondary | ICD-10-CM | POA: Diagnosis not present

## 2022-12-24 DIAGNOSIS — F0280 Dementia in other diseases classified elsewhere without behavioral disturbance: Secondary | ICD-10-CM | POA: Diagnosis not present

## 2022-12-24 DIAGNOSIS — G20A1 Parkinson's disease without dyskinesia, without mention of fluctuations: Secondary | ICD-10-CM | POA: Diagnosis not present

## 2022-12-25 DIAGNOSIS — I130 Hypertensive heart and chronic kidney disease with heart failure and stage 1 through stage 4 chronic kidney disease, or unspecified chronic kidney disease: Secondary | ICD-10-CM | POA: Diagnosis not present

## 2022-12-25 DIAGNOSIS — I509 Heart failure, unspecified: Secondary | ICD-10-CM | POA: Diagnosis not present

## 2022-12-25 DIAGNOSIS — G20A1 Parkinson's disease without dyskinesia, without mention of fluctuations: Secondary | ICD-10-CM | POA: Diagnosis not present

## 2022-12-25 DIAGNOSIS — N1832 Chronic kidney disease, stage 3b: Secondary | ICD-10-CM | POA: Diagnosis not present

## 2022-12-25 DIAGNOSIS — F0284 Dementia in other diseases classified elsewhere, unspecified severity, with anxiety: Secondary | ICD-10-CM | POA: Diagnosis not present

## 2022-12-25 DIAGNOSIS — F0283 Dementia in other diseases classified elsewhere, unspecified severity, with mood disturbance: Secondary | ICD-10-CM | POA: Diagnosis not present

## 2022-12-27 DIAGNOSIS — L309 Dermatitis, unspecified: Secondary | ICD-10-CM | POA: Diagnosis not present

## 2022-12-27 DIAGNOSIS — I130 Hypertensive heart and chronic kidney disease with heart failure and stage 1 through stage 4 chronic kidney disease, or unspecified chronic kidney disease: Secondary | ICD-10-CM | POA: Diagnosis not present

## 2022-12-27 DIAGNOSIS — I509 Heart failure, unspecified: Secondary | ICD-10-CM | POA: Diagnosis not present

## 2022-12-27 DIAGNOSIS — Z87310 Personal history of (healed) osteoporosis fracture: Secondary | ICD-10-CM | POA: Diagnosis not present

## 2022-12-27 DIAGNOSIS — H04129 Dry eye syndrome of unspecified lacrimal gland: Secondary | ICD-10-CM | POA: Diagnosis not present

## 2022-12-27 DIAGNOSIS — M810 Age-related osteoporosis without current pathological fracture: Secondary | ICD-10-CM | POA: Diagnosis not present

## 2022-12-27 DIAGNOSIS — E785 Hyperlipidemia, unspecified: Secondary | ICD-10-CM | POA: Diagnosis not present

## 2022-12-27 DIAGNOSIS — Z741 Need for assistance with personal care: Secondary | ICD-10-CM | POA: Diagnosis not present

## 2022-12-27 DIAGNOSIS — K219 Gastro-esophageal reflux disease without esophagitis: Secondary | ICD-10-CM | POA: Diagnosis not present

## 2022-12-27 DIAGNOSIS — F0283 Dementia in other diseases classified elsewhere, unspecified severity, with mood disturbance: Secondary | ICD-10-CM | POA: Diagnosis not present

## 2022-12-27 DIAGNOSIS — D509 Iron deficiency anemia, unspecified: Secondary | ICD-10-CM | POA: Diagnosis not present

## 2022-12-27 DIAGNOSIS — N1832 Chronic kidney disease, stage 3b: Secondary | ICD-10-CM | POA: Diagnosis not present

## 2022-12-27 DIAGNOSIS — R159 Full incontinence of feces: Secondary | ICD-10-CM | POA: Diagnosis not present

## 2022-12-27 DIAGNOSIS — F0284 Dementia in other diseases classified elsewhere, unspecified severity, with anxiety: Secondary | ICD-10-CM | POA: Diagnosis not present

## 2022-12-27 DIAGNOSIS — R32 Unspecified urinary incontinence: Secondary | ICD-10-CM | POA: Diagnosis not present

## 2022-12-27 DIAGNOSIS — Z6823 Body mass index (BMI) 23.0-23.9, adult: Secondary | ICD-10-CM | POA: Diagnosis not present

## 2022-12-27 DIAGNOSIS — G20A1 Parkinson's disease without dyskinesia, without mention of fluctuations: Secondary | ICD-10-CM | POA: Diagnosis not present

## 2022-12-29 DIAGNOSIS — F0283 Dementia in other diseases classified elsewhere, unspecified severity, with mood disturbance: Secondary | ICD-10-CM | POA: Diagnosis not present

## 2022-12-29 DIAGNOSIS — I130 Hypertensive heart and chronic kidney disease with heart failure and stage 1 through stage 4 chronic kidney disease, or unspecified chronic kidney disease: Secondary | ICD-10-CM | POA: Diagnosis not present

## 2022-12-29 DIAGNOSIS — G20A1 Parkinson's disease without dyskinesia, without mention of fluctuations: Secondary | ICD-10-CM | POA: Diagnosis not present

## 2022-12-29 DIAGNOSIS — I509 Heart failure, unspecified: Secondary | ICD-10-CM | POA: Diagnosis not present

## 2022-12-29 DIAGNOSIS — N1832 Chronic kidney disease, stage 3b: Secondary | ICD-10-CM | POA: Diagnosis not present

## 2022-12-29 DIAGNOSIS — F0284 Dementia in other diseases classified elsewhere, unspecified severity, with anxiety: Secondary | ICD-10-CM | POA: Diagnosis not present

## 2023-01-05 DIAGNOSIS — N1832 Chronic kidney disease, stage 3b: Secondary | ICD-10-CM | POA: Diagnosis not present

## 2023-01-05 DIAGNOSIS — F0284 Dementia in other diseases classified elsewhere, unspecified severity, with anxiety: Secondary | ICD-10-CM | POA: Diagnosis not present

## 2023-01-05 DIAGNOSIS — I509 Heart failure, unspecified: Secondary | ICD-10-CM | POA: Diagnosis not present

## 2023-01-05 DIAGNOSIS — F0283 Dementia in other diseases classified elsewhere, unspecified severity, with mood disturbance: Secondary | ICD-10-CM | POA: Diagnosis not present

## 2023-01-05 DIAGNOSIS — I130 Hypertensive heart and chronic kidney disease with heart failure and stage 1 through stage 4 chronic kidney disease, or unspecified chronic kidney disease: Secondary | ICD-10-CM | POA: Diagnosis not present

## 2023-01-05 DIAGNOSIS — G20A1 Parkinson's disease without dyskinesia, without mention of fluctuations: Secondary | ICD-10-CM | POA: Diagnosis not present

## 2023-01-09 DIAGNOSIS — F0283 Dementia in other diseases classified elsewhere, unspecified severity, with mood disturbance: Secondary | ICD-10-CM | POA: Diagnosis not present

## 2023-01-09 DIAGNOSIS — I509 Heart failure, unspecified: Secondary | ICD-10-CM | POA: Diagnosis not present

## 2023-01-09 DIAGNOSIS — F0284 Dementia in other diseases classified elsewhere, unspecified severity, with anxiety: Secondary | ICD-10-CM | POA: Diagnosis not present

## 2023-01-09 DIAGNOSIS — I130 Hypertensive heart and chronic kidney disease with heart failure and stage 1 through stage 4 chronic kidney disease, or unspecified chronic kidney disease: Secondary | ICD-10-CM | POA: Diagnosis not present

## 2023-01-09 DIAGNOSIS — G20A1 Parkinson's disease without dyskinesia, without mention of fluctuations: Secondary | ICD-10-CM | POA: Diagnosis not present

## 2023-01-09 DIAGNOSIS — N1832 Chronic kidney disease, stage 3b: Secondary | ICD-10-CM | POA: Diagnosis not present

## 2023-01-12 DIAGNOSIS — F0284 Dementia in other diseases classified elsewhere, unspecified severity, with anxiety: Secondary | ICD-10-CM | POA: Diagnosis not present

## 2023-01-12 DIAGNOSIS — I509 Heart failure, unspecified: Secondary | ICD-10-CM | POA: Diagnosis not present

## 2023-01-12 DIAGNOSIS — F0283 Dementia in other diseases classified elsewhere, unspecified severity, with mood disturbance: Secondary | ICD-10-CM | POA: Diagnosis not present

## 2023-01-12 DIAGNOSIS — N1832 Chronic kidney disease, stage 3b: Secondary | ICD-10-CM | POA: Diagnosis not present

## 2023-01-12 DIAGNOSIS — I130 Hypertensive heart and chronic kidney disease with heart failure and stage 1 through stage 4 chronic kidney disease, or unspecified chronic kidney disease: Secondary | ICD-10-CM | POA: Diagnosis not present

## 2023-01-12 DIAGNOSIS — G20A1 Parkinson's disease without dyskinesia, without mention of fluctuations: Secondary | ICD-10-CM | POA: Diagnosis not present

## 2023-01-13 DIAGNOSIS — I509 Heart failure, unspecified: Secondary | ICD-10-CM | POA: Diagnosis not present

## 2023-01-13 DIAGNOSIS — I130 Hypertensive heart and chronic kidney disease with heart failure and stage 1 through stage 4 chronic kidney disease, or unspecified chronic kidney disease: Secondary | ICD-10-CM | POA: Diagnosis not present

## 2023-01-13 DIAGNOSIS — F0283 Dementia in other diseases classified elsewhere, unspecified severity, with mood disturbance: Secondary | ICD-10-CM | POA: Diagnosis not present

## 2023-01-13 DIAGNOSIS — G20A1 Parkinson's disease without dyskinesia, without mention of fluctuations: Secondary | ICD-10-CM | POA: Diagnosis not present

## 2023-01-13 DIAGNOSIS — F0284 Dementia in other diseases classified elsewhere, unspecified severity, with anxiety: Secondary | ICD-10-CM | POA: Diagnosis not present

## 2023-01-13 DIAGNOSIS — N1832 Chronic kidney disease, stage 3b: Secondary | ICD-10-CM | POA: Diagnosis not present

## 2023-01-18 DIAGNOSIS — F0283 Dementia in other diseases classified elsewhere, unspecified severity, with mood disturbance: Secondary | ICD-10-CM | POA: Diagnosis not present

## 2023-01-18 DIAGNOSIS — I509 Heart failure, unspecified: Secondary | ICD-10-CM | POA: Diagnosis not present

## 2023-01-18 DIAGNOSIS — G20A1 Parkinson's disease without dyskinesia, without mention of fluctuations: Secondary | ICD-10-CM | POA: Diagnosis not present

## 2023-01-18 DIAGNOSIS — N1832 Chronic kidney disease, stage 3b: Secondary | ICD-10-CM | POA: Diagnosis not present

## 2023-01-18 DIAGNOSIS — I130 Hypertensive heart and chronic kidney disease with heart failure and stage 1 through stage 4 chronic kidney disease, or unspecified chronic kidney disease: Secondary | ICD-10-CM | POA: Diagnosis not present

## 2023-01-18 DIAGNOSIS — F0284 Dementia in other diseases classified elsewhere, unspecified severity, with anxiety: Secondary | ICD-10-CM | POA: Diagnosis not present

## 2023-01-20 DIAGNOSIS — I130 Hypertensive heart and chronic kidney disease with heart failure and stage 1 through stage 4 chronic kidney disease, or unspecified chronic kidney disease: Secondary | ICD-10-CM | POA: Diagnosis not present

## 2023-01-20 DIAGNOSIS — I509 Heart failure, unspecified: Secondary | ICD-10-CM | POA: Diagnosis not present

## 2023-01-20 DIAGNOSIS — Z471 Aftercare following joint replacement surgery: Secondary | ICD-10-CM | POA: Diagnosis not present

## 2023-01-20 DIAGNOSIS — Z96641 Presence of right artificial hip joint: Secondary | ICD-10-CM | POA: Diagnosis not present

## 2023-01-20 DIAGNOSIS — G20A1 Parkinson's disease without dyskinesia, without mention of fluctuations: Secondary | ICD-10-CM | POA: Diagnosis not present

## 2023-01-20 DIAGNOSIS — F0283 Dementia in other diseases classified elsewhere, unspecified severity, with mood disturbance: Secondary | ICD-10-CM | POA: Diagnosis not present

## 2023-01-20 DIAGNOSIS — N1832 Chronic kidney disease, stage 3b: Secondary | ICD-10-CM | POA: Diagnosis not present

## 2023-01-20 DIAGNOSIS — F0284 Dementia in other diseases classified elsewhere, unspecified severity, with anxiety: Secondary | ICD-10-CM | POA: Diagnosis not present

## 2023-01-20 DIAGNOSIS — S72031D Displaced midcervical fracture of right femur, subsequent encounter for closed fracture with routine healing: Secondary | ICD-10-CM | POA: Diagnosis not present

## 2023-01-26 DIAGNOSIS — Z6822 Body mass index (BMI) 22.0-22.9, adult: Secondary | ICD-10-CM | POA: Diagnosis not present

## 2023-01-26 DIAGNOSIS — E785 Hyperlipidemia, unspecified: Secondary | ICD-10-CM | POA: Diagnosis not present

## 2023-01-26 DIAGNOSIS — D509 Iron deficiency anemia, unspecified: Secondary | ICD-10-CM | POA: Diagnosis not present

## 2023-01-26 DIAGNOSIS — R159 Full incontinence of feces: Secondary | ICD-10-CM | POA: Diagnosis not present

## 2023-01-26 DIAGNOSIS — F0284 Dementia in other diseases classified elsewhere, unspecified severity, with anxiety: Secondary | ICD-10-CM | POA: Diagnosis not present

## 2023-01-26 DIAGNOSIS — G20A1 Parkinson's disease without dyskinesia, without mention of fluctuations: Secondary | ICD-10-CM | POA: Diagnosis not present

## 2023-01-26 DIAGNOSIS — I509 Heart failure, unspecified: Secondary | ICD-10-CM | POA: Diagnosis not present

## 2023-01-26 DIAGNOSIS — H04129 Dry eye syndrome of unspecified lacrimal gland: Secondary | ICD-10-CM | POA: Diagnosis not present

## 2023-01-26 DIAGNOSIS — L309 Dermatitis, unspecified: Secondary | ICD-10-CM | POA: Diagnosis not present

## 2023-01-26 DIAGNOSIS — Z87310 Personal history of (healed) osteoporosis fracture: Secondary | ICD-10-CM | POA: Diagnosis not present

## 2023-01-26 DIAGNOSIS — M810 Age-related osteoporosis without current pathological fracture: Secondary | ICD-10-CM | POA: Diagnosis not present

## 2023-01-26 DIAGNOSIS — R32 Unspecified urinary incontinence: Secondary | ICD-10-CM | POA: Diagnosis not present

## 2023-01-26 DIAGNOSIS — I130 Hypertensive heart and chronic kidney disease with heart failure and stage 1 through stage 4 chronic kidney disease, or unspecified chronic kidney disease: Secondary | ICD-10-CM | POA: Diagnosis not present

## 2023-01-26 DIAGNOSIS — F0283 Dementia in other diseases classified elsewhere, unspecified severity, with mood disturbance: Secondary | ICD-10-CM | POA: Diagnosis not present

## 2023-01-26 DIAGNOSIS — N1832 Chronic kidney disease, stage 3b: Secondary | ICD-10-CM | POA: Diagnosis not present

## 2023-01-26 DIAGNOSIS — K219 Gastro-esophageal reflux disease without esophagitis: Secondary | ICD-10-CM | POA: Diagnosis not present

## 2023-01-26 DIAGNOSIS — Z741 Need for assistance with personal care: Secondary | ICD-10-CM | POA: Diagnosis not present

## 2023-01-27 DIAGNOSIS — N1832 Chronic kidney disease, stage 3b: Secondary | ICD-10-CM | POA: Diagnosis not present

## 2023-01-27 DIAGNOSIS — I130 Hypertensive heart and chronic kidney disease with heart failure and stage 1 through stage 4 chronic kidney disease, or unspecified chronic kidney disease: Secondary | ICD-10-CM | POA: Diagnosis not present

## 2023-01-27 DIAGNOSIS — I509 Heart failure, unspecified: Secondary | ICD-10-CM | POA: Diagnosis not present

## 2023-01-27 DIAGNOSIS — F0284 Dementia in other diseases classified elsewhere, unspecified severity, with anxiety: Secondary | ICD-10-CM | POA: Diagnosis not present

## 2023-01-27 DIAGNOSIS — G20A1 Parkinson's disease without dyskinesia, without mention of fluctuations: Secondary | ICD-10-CM | POA: Diagnosis not present

## 2023-01-27 DIAGNOSIS — F0283 Dementia in other diseases classified elsewhere, unspecified severity, with mood disturbance: Secondary | ICD-10-CM | POA: Diagnosis not present

## 2023-01-30 ENCOUNTER — Ambulatory Visit (INDEPENDENT_AMBULATORY_CARE_PROVIDER_SITE_OTHER): Payer: Medicare Other

## 2023-01-30 DIAGNOSIS — I495 Sick sinus syndrome: Secondary | ICD-10-CM

## 2023-02-02 DIAGNOSIS — G20A1 Parkinson's disease without dyskinesia, without mention of fluctuations: Secondary | ICD-10-CM | POA: Diagnosis not present

## 2023-02-02 DIAGNOSIS — I509 Heart failure, unspecified: Secondary | ICD-10-CM | POA: Diagnosis not present

## 2023-02-02 DIAGNOSIS — F0283 Dementia in other diseases classified elsewhere, unspecified severity, with mood disturbance: Secondary | ICD-10-CM | POA: Diagnosis not present

## 2023-02-02 DIAGNOSIS — F0284 Dementia in other diseases classified elsewhere, unspecified severity, with anxiety: Secondary | ICD-10-CM | POA: Diagnosis not present

## 2023-02-02 DIAGNOSIS — I130 Hypertensive heart and chronic kidney disease with heart failure and stage 1 through stage 4 chronic kidney disease, or unspecified chronic kidney disease: Secondary | ICD-10-CM | POA: Diagnosis not present

## 2023-02-02 DIAGNOSIS — N1832 Chronic kidney disease, stage 3b: Secondary | ICD-10-CM | POA: Diagnosis not present

## 2023-02-03 LAB — CUP PACEART REMOTE DEVICE CHECK
Battery Remaining Longevity: 102 mo
Battery Remaining Percentage: 98 %
Brady Statistic RA Percent Paced: 29 %
Brady Statistic RV Percent Paced: 0 %
Date Time Interrogation Session: 20240709030100
Implantable Lead Connection Status: 753985
Implantable Lead Connection Status: 753985
Implantable Lead Implant Date: 20171009
Implantable Lead Implant Date: 20171009
Implantable Lead Location: 753859
Implantable Lead Location: 753860
Implantable Lead Model: 5076
Implantable Lead Model: 7741
Implantable Lead Serial Number: 785430
Implantable Pulse Generator Implant Date: 20171009
Lead Channel Impedance Value: 1857 Ohm
Lead Channel Impedance Value: 541 Ohm
Lead Channel Setting Pacing Amplitude: 2 V
Lead Channel Setting Pacing Amplitude: 2.6 V
Lead Channel Setting Pacing Pulse Width: 1 ms
Lead Channel Setting Sensing Sensitivity: 2.5 mV
Pulse Gen Serial Number: 766467
Zone Setting Status: 755011

## 2023-02-10 DIAGNOSIS — D509 Iron deficiency anemia, unspecified: Secondary | ICD-10-CM | POA: Diagnosis not present

## 2023-02-10 DIAGNOSIS — K219 Gastro-esophageal reflux disease without esophagitis: Secondary | ICD-10-CM | POA: Diagnosis not present

## 2023-02-10 DIAGNOSIS — I130 Hypertensive heart and chronic kidney disease with heart failure and stage 1 through stage 4 chronic kidney disease, or unspecified chronic kidney disease: Secondary | ICD-10-CM | POA: Diagnosis not present

## 2023-02-10 DIAGNOSIS — F0283 Dementia in other diseases classified elsewhere, unspecified severity, with mood disturbance: Secondary | ICD-10-CM | POA: Diagnosis not present

## 2023-02-10 DIAGNOSIS — G8929 Other chronic pain: Secondary | ICD-10-CM | POA: Diagnosis not present

## 2023-02-10 DIAGNOSIS — I509 Heart failure, unspecified: Secondary | ICD-10-CM | POA: Diagnosis not present

## 2023-02-10 DIAGNOSIS — F32A Depression, unspecified: Secondary | ICD-10-CM | POA: Diagnosis not present

## 2023-02-10 DIAGNOSIS — R29818 Other symptoms and signs involving the nervous system: Secondary | ICD-10-CM | POA: Diagnosis not present

## 2023-02-10 DIAGNOSIS — N1832 Chronic kidney disease, stage 3b: Secondary | ICD-10-CM | POA: Diagnosis not present

## 2023-02-10 DIAGNOSIS — E785 Hyperlipidemia, unspecified: Secondary | ICD-10-CM | POA: Diagnosis not present

## 2023-02-10 DIAGNOSIS — E538 Deficiency of other specified B group vitamins: Secondary | ICD-10-CM | POA: Diagnosis not present

## 2023-02-10 DIAGNOSIS — G20A1 Parkinson's disease without dyskinesia, without mention of fluctuations: Secondary | ICD-10-CM | POA: Diagnosis not present

## 2023-02-10 DIAGNOSIS — B079 Viral wart, unspecified: Secondary | ICD-10-CM | POA: Diagnosis not present

## 2023-02-10 DIAGNOSIS — R262 Difficulty in walking, not elsewhere classified: Secondary | ICD-10-CM | POA: Diagnosis not present

## 2023-02-10 DIAGNOSIS — Z66 Do not resuscitate: Secondary | ICD-10-CM | POA: Diagnosis not present

## 2023-02-10 DIAGNOSIS — F0284 Dementia in other diseases classified elsewhere, unspecified severity, with anxiety: Secondary | ICD-10-CM | POA: Diagnosis not present

## 2023-02-17 ENCOUNTER — Ambulatory Visit: Payer: Medicare Other | Attending: Internal Medicine | Admitting: Internal Medicine

## 2023-02-17 ENCOUNTER — Encounter: Payer: Self-pay | Admitting: Internal Medicine

## 2023-02-17 VITALS — BP 134/66 | HR 62 | Ht 61.0 in | Wt 125.8 lb

## 2023-02-17 DIAGNOSIS — Z950 Presence of cardiac pacemaker: Secondary | ICD-10-CM | POA: Insufficient documentation

## 2023-02-17 DIAGNOSIS — I495 Sick sinus syndrome: Secondary | ICD-10-CM | POA: Insufficient documentation

## 2023-02-17 DIAGNOSIS — I5032 Chronic diastolic (congestive) heart failure: Secondary | ICD-10-CM | POA: Insufficient documentation

## 2023-02-17 NOTE — Progress Notes (Signed)
HPI  [name] returns today for followup. She is a pleasant 88 yo woman with a h/o CAD, HFPEF, breast CA and sinus node dysfunction. She has been diagnosed with Parkinsons and is on medical therapy. She has had trouble with ambulation and is now wheel chair bound.     Current Outpatient Medications  Medication Sig Dispense Refill   acetaminophen (TYLENOL) 500 MG tablet Take 500 mg by mouth every 8 (eight) hours as needed for mild pain.     cyanocobalamin (VITAMIN B12) 1000 MCG tablet Take 1,000 mcg by mouth daily.     escitalopram (LEXAPRO) 10 MG tablet Take one tablet daily 90 tablet 0   LORazepam (ATIVAN) 0.5 MG tablet Take 0.5 mg by mouth 3 (three) times daily between meals as needed for anxiety.     Misc. Devices [facility]) MISC Power Wheelchair 1 each 0   omeprazole (PRILOSEC) 40 MG capsule TAKE 1 CAPSULE BY MOUTH TWICE A DAY FOR GERD. (Patient taking differently: Take 40 mg by mouth 2 (two) times daily before a meal.) 28 capsule 0   polyethylene glycol (MIRALAX / GLYCOLAX) packet Take 17 g by mouth daily as needed (constipation- MIX AND DRINK AS DIRECTED).     senna-docusate (SENOKOT-S) 8.6-50 MG tablet Take 1 tablet by mouth 2 (two) times daily between meals as needed for mild constipation. 60 tablet 0   traMADol (ULTRAM) 50 MG tablet Take 25 mg by mouth 3 (three) times daily.     amLODipine (NORVASC) 5 MG tablet Take 1 tablet (5 mg total) by mouth daily. 30 tablet 0   No current facility-administered medications for this visit.     Past Medical History:  Diagnosis Date   Allergy    Anemia    Cancer of left breast (HCC) 1978   CHF (congestive heart failure) (HCC)    Chronic thoracic back pain    "T8; fracture; 03/2015; no OR" (05/05/2016)   GERD (gastroesophageal reflux disease)    Heart murmur    History of hiatal hernia    Hyperlipidemia    Hyperplastic colon polyp    Hypertension    IBS (irritable bowel syndrome)    Multiple thyroid nodules     Osteoporosis    T8 compression fx 03/2015    Personal history of radiation therapy    Presence of permanent cardiac pacemaker    Vitamin B12 deficiency     ROS:   All systems reviewed and negative except as noted in the HPI.   Past Surgical History:  Procedure Laterality Date   ANTERIOR CERVICAL DECOMP/DISCECTOMY FUSION  2008   C5-6   AUGMENTATION MAMMAPLASTY     BACK SURGERY     BREAST SURGERY     CARDIAC CATHETERIZATION  2001   CORONARY PRESSURE/FFR STUDY N/A 10/07/2019   Procedure: INTRAVASCULAR PRESSURE WIRE/FFR STUDY;  Surgeon: Corky Crafts, MD;  Location: [facility] INVASIVE CV LAB;  Service: Cardiovascular;  Laterality: N/A;   EP IMPLANTABLE DEVICE N/A 05/05/2016   Procedure: Pacemaker Implant;  Surgeon: Marinus Maw, MD;  Location: [facility] INVASIVE CV LAB;  Service: Cardiovascular;  Laterality: N/A;   EXCISIONAL HEMORRHOIDECTOMY  1984   With subsequent correction of surgery   INSERT / REPLACE / REMOVE PACEMAKER     KNEE ARTHROSCOPY Left    "meniscus tear"   LEFT HEART CATH AND CORONARY ANGIOGRAPHY N/A 10/07/2019   Procedure: LEFT HEART CATH AND CORONARY ANGIOGRAPHY;  Surgeon: Corky Crafts, MD;  Location: [facility] INVASIVE CV LAB;  Service: Cardiovascular;  Laterality: N/A;   MASTECTOMY Left 1978   PLACEMENT OF BREAST IMPLANTS Left 1981   REDUCTION MAMMAPLASTY Right    SHOULDER ARTHROSCOPY W/ ROTATOR CUFF REPAIR Left 02/2006   "tear"   TOTAL HIP ARTHROPLASTY Right 12/10/2022   Procedure: TOTAL HIP ARTHROPLASTY ANTERIOR APPROACH;  Surgeon: Samson Frederic, MD;  Location: [facility] ORS;  Service: Orthopedics;  Laterality: Right;   TUBAL LIGATION       Family History  Problem Relation Age of Onset   Stroke Sister    Diabetes Brother    Breast cancer Maternal Aunt    Atrial fibrillation Sister    Colon cancer Neg Hx    Stomach cancer Neg Hx      Social History   Socioeconomic History   Marital status: Married    Spouse name: Not on file   Number of children: 2   Years  of education: Not on file   Highest education level: Not on file  Occupational History   Occupation: retired  Tobacco Use   Smoking status: Never   Smokeless tobacco: Never  Vaping Use   Vaping status: Never Used  Substance and Sexual Activity   Alcohol use: No    Alcohol/week: 0.0 standard drinks of alcohol   Drug use: No   Sexual activity: Not Currently  Other Topics Concern   Not on file  Social History Narrative   Housewife, Lives with spouse, 2 children   Social Determinants of Health   Financial Resource Strain: Low Risk  (04/04/2021)   Overall Financial Resource Strain (CARDIA)    Difficulty of Paying Living Expenses: Not hard at all  Food Insecurity: No Food Insecurity (12/08/2022)   Hunger Vital Sign    Worried About Running Out of Food in the Last Year: Never true    Ran Out of Food in the Last Year: Never true  Transportation Needs: No Transportation Needs (12/08/2022)   PRAPARE - Administrator, Civil Service (Medical): No    Lack of Transportation (Non-Medical): No  Physical Activity: Inactive (04/04/2021)   Exercise Vital Sign    Days of Exercise per Week: 0 days    Minutes of Exercise per Session: 0 min  Stress: No Stress Concern Present (04/04/2021)   [employer] of Occupational Health - Occupational Stress Questionnaire    Feeling of Stress : Not at all  Social Connections: Socially Integrated (04/04/2021)   Social Connection and Isolation Panel [NHANES]    Frequency of Communication with Friends and Family: More than three times a week    Frequency of Social Gatherings with Friends and Family: More than three times a week    Attends Religious Services: 1 to 4 times per year    Active Member of [employer] or Organizations: No    Attends Engineer, structural: 1 to 4 times per year    Marital Status: Married  Catering manager Violence: Not At Risk (12/08/2022)   Humiliation, Afraid, Rape, and Kick questionnaire    Fear of Current or  Ex-Partner: No    Emotionally Abused: No    Physically Abused: No    Sexually Abused: No     BP 134/66   Pulse 62   Ht 5\' 1"  (1.549 m)   Wt 125 lb 12.8 oz (57.1 kg)   SpO2 96%   BMI 23.77 kg/m   Physical Exam:  Well appearing NAD HEENT: Unremarkable Neck:  No JVD, no thyromegally Lymphatics:  No adenopathy Back:  No CVA  tenderness Lungs:  Clear HEART:  Regular rate rhythm, no murmurs, no rubs, no clicks Abd:  soft, positive bowel sounds, no organomegally, no rebound, no guarding Ext:  2 plus pulses, no edema, no cyanosis, no clubbing Skin:  No rashes no nodules Neuro:  CN II through XII intact, motor grossly intact  DEVICE  Normal device function.  See PaceArt for details.   Assess/Plan:  HFPEF - she has become more sedentary due to the Parkinsons. I have asked her to avoid salty foods.  Sinus node dysfunction - she is asymptomatic, s/p PPM insertion. Peripheral edema - she is taking the lasix only as needed.  PPM - her [place] Sci device is stable. Her RV bipolar threshold is stable as is the bipolar impedence. We will follow.    Sharlot Gowda Gerome Kokesh,MD

## 2023-02-17 NOTE — Patient Instructions (Addendum)
Medication Instructions:  Your physician recommends that you continue on your current medications as directed. Please refer to the Current Medication list given to you today.  *If you need a refill on your cardiac medications before your next appointment, please call your pharmacy*  Lab Work: None ordered.  If you have labs (blood work) drawn today and your tests are completely normal, you will receive your results only by: MyChart Message (if you have MyChart) OR A paper copy in the mail If you have any lab test that is abnormal or we need to change your treatment, we will call you to review the results.  Testing/Procedures: None ordered.  Follow-Up: At [facility], you and your health needs are our priority.  As part of our continuing mission to provide you with exceptional heart care, we have created designated Provider Care Teams.  These Care Teams include your primary Cardiologist (physician) and Advanced Practice Providers (APPs -  Physician Assistants and Nurse Practitioners) who all work together to provide you with the care you need, when you need it.  We recommend signing up for the patient portal called "MyChart".  Sign up information is provided on this After Visit Summary.  MyChart is used to connect with patients for Virtual Visits (Telemedicine).  Patients are able to view lab/test results, encounter notes, upcoming appointments, etc.  Non-urgent messages can be sent to your provider as well.   To learn more about what you can do with MyChart, go to [email or web address].    Your next appointment:   1 year(s)  The format for your next appointment:   In Person  Provider:   Lewayne Bunting, MD{or one of the following Advanced Practice Providers on your designated Care Team:   Francis Dowse, [place] Casimiro Needle "Mardelle Matte" [place], [place] Earnest Rosier, NP  Remote monitoring is used to monitor your Pacemaker/ ICD from home. This monitoring reduces the number of office visits required  to check your device to one time per year. It allows [place] to keep an eye on the functioning of your device to ensure it is working properly. You are scheduled for a device check from home on 05/01/2023. You may send your transmission at any time that day. If you have a wireless device, the transmission will be sent automatically. After your physician reviews your transmission, you will receive a postcard with your next transmission date.  Important Information About Sugar

## 2023-02-18 DIAGNOSIS — F0283 Dementia in other diseases classified elsewhere, unspecified severity, with mood disturbance: Secondary | ICD-10-CM | POA: Diagnosis not present

## 2023-02-18 DIAGNOSIS — G20A1 Parkinson's disease without dyskinesia, without mention of fluctuations: Secondary | ICD-10-CM | POA: Diagnosis not present

## 2023-02-18 DIAGNOSIS — N1832 Chronic kidney disease, stage 3b: Secondary | ICD-10-CM | POA: Diagnosis not present

## 2023-02-18 DIAGNOSIS — I509 Heart failure, unspecified: Secondary | ICD-10-CM | POA: Diagnosis not present

## 2023-02-18 DIAGNOSIS — F0284 Dementia in other diseases classified elsewhere, unspecified severity, with anxiety: Secondary | ICD-10-CM | POA: Diagnosis not present

## 2023-02-18 DIAGNOSIS — I130 Hypertensive heart and chronic kidney disease with heart failure and stage 1 through stage 4 chronic kidney disease, or unspecified chronic kidney disease: Secondary | ICD-10-CM | POA: Diagnosis not present

## 2023-02-18 NOTE — Progress Notes (Signed)
Remote pacemaker transmission.   

## 2023-02-24 DIAGNOSIS — F0283 Dementia in other diseases classified elsewhere, unspecified severity, with mood disturbance: Secondary | ICD-10-CM | POA: Diagnosis not present

## 2023-02-24 DIAGNOSIS — N1832 Chronic kidney disease, stage 3b: Secondary | ICD-10-CM | POA: Diagnosis not present

## 2023-02-24 DIAGNOSIS — F0284 Dementia in other diseases classified elsewhere, unspecified severity, with anxiety: Secondary | ICD-10-CM | POA: Diagnosis not present

## 2023-02-24 DIAGNOSIS — G20A1 Parkinson's disease without dyskinesia, without mention of fluctuations: Secondary | ICD-10-CM | POA: Diagnosis not present

## 2023-02-24 DIAGNOSIS — I130 Hypertensive heart and chronic kidney disease with heart failure and stage 1 through stage 4 chronic kidney disease, or unspecified chronic kidney disease: Secondary | ICD-10-CM | POA: Diagnosis not present

## 2023-02-24 DIAGNOSIS — I509 Heart failure, unspecified: Secondary | ICD-10-CM | POA: Diagnosis not present

## 2023-02-26 DIAGNOSIS — R32 Unspecified urinary incontinence: Secondary | ICD-10-CM | POA: Diagnosis not present

## 2023-02-26 DIAGNOSIS — F0284 Dementia in other diseases classified elsewhere, unspecified severity, with anxiety: Secondary | ICD-10-CM | POA: Diagnosis not present

## 2023-02-26 DIAGNOSIS — I130 Hypertensive heart and chronic kidney disease with heart failure and stage 1 through stage 4 chronic kidney disease, or unspecified chronic kidney disease: Secondary | ICD-10-CM | POA: Diagnosis not present

## 2023-02-26 DIAGNOSIS — E785 Hyperlipidemia, unspecified: Secondary | ICD-10-CM | POA: Diagnosis not present

## 2023-02-26 DIAGNOSIS — L309 Dermatitis, unspecified: Secondary | ICD-10-CM | POA: Diagnosis not present

## 2023-02-26 DIAGNOSIS — Z6822 Body mass index (BMI) 22.0-22.9, adult: Secondary | ICD-10-CM | POA: Diagnosis not present

## 2023-02-26 DIAGNOSIS — I509 Heart failure, unspecified: Secondary | ICD-10-CM | POA: Diagnosis not present

## 2023-02-26 DIAGNOSIS — D509 Iron deficiency anemia, unspecified: Secondary | ICD-10-CM | POA: Diagnosis not present

## 2023-02-26 DIAGNOSIS — Z741 Need for assistance with personal care: Secondary | ICD-10-CM | POA: Diagnosis not present

## 2023-02-26 DIAGNOSIS — H04129 Dry eye syndrome of unspecified lacrimal gland: Secondary | ICD-10-CM | POA: Diagnosis not present

## 2023-02-26 DIAGNOSIS — R159 Full incontinence of feces: Secondary | ICD-10-CM | POA: Diagnosis not present

## 2023-02-26 DIAGNOSIS — F0283 Dementia in other diseases classified elsewhere, unspecified severity, with mood disturbance: Secondary | ICD-10-CM | POA: Diagnosis not present

## 2023-02-26 DIAGNOSIS — K219 Gastro-esophageal reflux disease without esophagitis: Secondary | ICD-10-CM | POA: Diagnosis not present

## 2023-02-26 DIAGNOSIS — N1832 Chronic kidney disease, stage 3b: Secondary | ICD-10-CM | POA: Diagnosis not present

## 2023-02-26 DIAGNOSIS — M810 Age-related osteoporosis without current pathological fracture: Secondary | ICD-10-CM | POA: Diagnosis not present

## 2023-02-26 DIAGNOSIS — Z87310 Personal history of (healed) osteoporosis fracture: Secondary | ICD-10-CM | POA: Diagnosis not present

## 2023-02-26 DIAGNOSIS — G20A1 Parkinson's disease without dyskinesia, without mention of fluctuations: Secondary | ICD-10-CM | POA: Diagnosis not present

## 2023-02-27 DIAGNOSIS — N1832 Chronic kidney disease, stage 3b: Secondary | ICD-10-CM | POA: Diagnosis not present

## 2023-02-27 DIAGNOSIS — G20A1 Parkinson's disease without dyskinesia, without mention of fluctuations: Secondary | ICD-10-CM | POA: Diagnosis not present

## 2023-02-27 DIAGNOSIS — F0284 Dementia in other diseases classified elsewhere, unspecified severity, with anxiety: Secondary | ICD-10-CM | POA: Diagnosis not present

## 2023-02-27 DIAGNOSIS — I130 Hypertensive heart and chronic kidney disease with heart failure and stage 1 through stage 4 chronic kidney disease, or unspecified chronic kidney disease: Secondary | ICD-10-CM | POA: Diagnosis not present

## 2023-02-27 DIAGNOSIS — F0283 Dementia in other diseases classified elsewhere, unspecified severity, with mood disturbance: Secondary | ICD-10-CM | POA: Diagnosis not present

## 2023-02-27 DIAGNOSIS — I509 Heart failure, unspecified: Secondary | ICD-10-CM | POA: Diagnosis not present

## 2023-02-28 DIAGNOSIS — F0284 Dementia in other diseases classified elsewhere, unspecified severity, with anxiety: Secondary | ICD-10-CM | POA: Diagnosis not present

## 2023-02-28 DIAGNOSIS — F0283 Dementia in other diseases classified elsewhere, unspecified severity, with mood disturbance: Secondary | ICD-10-CM | POA: Diagnosis not present

## 2023-02-28 DIAGNOSIS — I130 Hypertensive heart and chronic kidney disease with heart failure and stage 1 through stage 4 chronic kidney disease, or unspecified chronic kidney disease: Secondary | ICD-10-CM | POA: Diagnosis not present

## 2023-02-28 DIAGNOSIS — I509 Heart failure, unspecified: Secondary | ICD-10-CM | POA: Diagnosis not present

## 2023-02-28 DIAGNOSIS — G20A1 Parkinson's disease without dyskinesia, without mention of fluctuations: Secondary | ICD-10-CM | POA: Diagnosis not present

## 2023-02-28 DIAGNOSIS — N1832 Chronic kidney disease, stage 3b: Secondary | ICD-10-CM | POA: Diagnosis not present

## 2023-03-04 DIAGNOSIS — F0284 Dementia in other diseases classified elsewhere, unspecified severity, with anxiety: Secondary | ICD-10-CM | POA: Diagnosis not present

## 2023-03-04 DIAGNOSIS — F0283 Dementia in other diseases classified elsewhere, unspecified severity, with mood disturbance: Secondary | ICD-10-CM | POA: Diagnosis not present

## 2023-03-04 DIAGNOSIS — I130 Hypertensive heart and chronic kidney disease with heart failure and stage 1 through stage 4 chronic kidney disease, or unspecified chronic kidney disease: Secondary | ICD-10-CM | POA: Diagnosis not present

## 2023-03-04 DIAGNOSIS — I509 Heart failure, unspecified: Secondary | ICD-10-CM | POA: Diagnosis not present

## 2023-03-04 DIAGNOSIS — N1832 Chronic kidney disease, stage 3b: Secondary | ICD-10-CM | POA: Diagnosis not present

## 2023-03-04 DIAGNOSIS — G20A1 Parkinson's disease without dyskinesia, without mention of fluctuations: Secondary | ICD-10-CM | POA: Diagnosis not present

## 2023-03-11 DIAGNOSIS — G20A1 Parkinson's disease without dyskinesia, without mention of fluctuations: Secondary | ICD-10-CM | POA: Diagnosis not present

## 2023-03-11 DIAGNOSIS — F0283 Dementia in other diseases classified elsewhere, unspecified severity, with mood disturbance: Secondary | ICD-10-CM | POA: Diagnosis not present

## 2023-03-11 DIAGNOSIS — I509 Heart failure, unspecified: Secondary | ICD-10-CM | POA: Diagnosis not present

## 2023-03-11 DIAGNOSIS — N1832 Chronic kidney disease, stage 3b: Secondary | ICD-10-CM | POA: Diagnosis not present

## 2023-03-11 DIAGNOSIS — I130 Hypertensive heart and chronic kidney disease with heart failure and stage 1 through stage 4 chronic kidney disease, or unspecified chronic kidney disease: Secondary | ICD-10-CM | POA: Diagnosis not present

## 2023-03-11 DIAGNOSIS — F0284 Dementia in other diseases classified elsewhere, unspecified severity, with anxiety: Secondary | ICD-10-CM | POA: Diagnosis not present

## 2023-03-12 DIAGNOSIS — G20A1 Parkinson's disease without dyskinesia, without mention of fluctuations: Secondary | ICD-10-CM | POA: Diagnosis not present

## 2023-03-12 DIAGNOSIS — N1832 Chronic kidney disease, stage 3b: Secondary | ICD-10-CM | POA: Diagnosis not present

## 2023-03-12 DIAGNOSIS — I130 Hypertensive heart and chronic kidney disease with heart failure and stage 1 through stage 4 chronic kidney disease, or unspecified chronic kidney disease: Secondary | ICD-10-CM | POA: Diagnosis not present

## 2023-03-12 DIAGNOSIS — F0283 Dementia in other diseases classified elsewhere, unspecified severity, with mood disturbance: Secondary | ICD-10-CM | POA: Diagnosis not present

## 2023-03-12 DIAGNOSIS — I509 Heart failure, unspecified: Secondary | ICD-10-CM | POA: Diagnosis not present

## 2023-03-12 DIAGNOSIS — F0284 Dementia in other diseases classified elsewhere, unspecified severity, with anxiety: Secondary | ICD-10-CM | POA: Diagnosis not present

## 2023-03-29 DIAGNOSIS — E785 Hyperlipidemia, unspecified: Secondary | ICD-10-CM | POA: Diagnosis not present

## 2023-03-29 DIAGNOSIS — M810 Age-related osteoporosis without current pathological fracture: Secondary | ICD-10-CM | POA: Diagnosis not present

## 2023-03-29 DIAGNOSIS — F32A Depression, unspecified: Secondary | ICD-10-CM | POA: Diagnosis not present

## 2023-03-29 DIAGNOSIS — L309 Dermatitis, unspecified: Secondary | ICD-10-CM | POA: Diagnosis not present

## 2023-03-29 DIAGNOSIS — K219 Gastro-esophageal reflux disease without esophagitis: Secondary | ICD-10-CM | POA: Diagnosis not present

## 2023-03-29 DIAGNOSIS — D509 Iron deficiency anemia, unspecified: Secondary | ICD-10-CM | POA: Diagnosis not present

## 2023-03-29 DIAGNOSIS — G20A1 Parkinson's disease without dyskinesia, without mention of fluctuations: Secondary | ICD-10-CM | POA: Diagnosis not present

## 2023-03-29 DIAGNOSIS — N1832 Chronic kidney disease, stage 3b: Secondary | ICD-10-CM | POA: Diagnosis not present

## 2023-03-29 DIAGNOSIS — I509 Heart failure, unspecified: Secondary | ICD-10-CM | POA: Diagnosis not present

## 2023-03-30 DIAGNOSIS — E785 Hyperlipidemia, unspecified: Secondary | ICD-10-CM | POA: Diagnosis not present

## 2023-03-30 DIAGNOSIS — F32A Depression, unspecified: Secondary | ICD-10-CM | POA: Diagnosis not present

## 2023-03-30 DIAGNOSIS — I509 Heart failure, unspecified: Secondary | ICD-10-CM | POA: Diagnosis not present

## 2023-03-30 DIAGNOSIS — N1832 Chronic kidney disease, stage 3b: Secondary | ICD-10-CM | POA: Diagnosis not present

## 2023-03-30 DIAGNOSIS — G20A1 Parkinson's disease without dyskinesia, without mention of fluctuations: Secondary | ICD-10-CM | POA: Diagnosis not present

## 2023-03-30 DIAGNOSIS — D509 Iron deficiency anemia, unspecified: Secondary | ICD-10-CM | POA: Diagnosis not present

## 2023-03-31 DIAGNOSIS — F32A Depression, unspecified: Secondary | ICD-10-CM | POA: Diagnosis not present

## 2023-03-31 DIAGNOSIS — G20A1 Parkinson's disease without dyskinesia, without mention of fluctuations: Secondary | ICD-10-CM | POA: Diagnosis not present

## 2023-03-31 DIAGNOSIS — I509 Heart failure, unspecified: Secondary | ICD-10-CM | POA: Diagnosis not present

## 2023-03-31 DIAGNOSIS — E785 Hyperlipidemia, unspecified: Secondary | ICD-10-CM | POA: Diagnosis not present

## 2023-03-31 DIAGNOSIS — N1832 Chronic kidney disease, stage 3b: Secondary | ICD-10-CM | POA: Diagnosis not present

## 2023-03-31 DIAGNOSIS — D509 Iron deficiency anemia, unspecified: Secondary | ICD-10-CM | POA: Diagnosis not present

## 2023-04-01 DIAGNOSIS — F32A Depression, unspecified: Secondary | ICD-10-CM | POA: Diagnosis not present

## 2023-04-01 DIAGNOSIS — G20A1 Parkinson's disease without dyskinesia, without mention of fluctuations: Secondary | ICD-10-CM | POA: Diagnosis not present

## 2023-04-01 DIAGNOSIS — E785 Hyperlipidemia, unspecified: Secondary | ICD-10-CM | POA: Diagnosis not present

## 2023-04-01 DIAGNOSIS — D509 Iron deficiency anemia, unspecified: Secondary | ICD-10-CM | POA: Diagnosis not present

## 2023-04-01 DIAGNOSIS — I509 Heart failure, unspecified: Secondary | ICD-10-CM | POA: Diagnosis not present

## 2023-04-01 DIAGNOSIS — N1832 Chronic kidney disease, stage 3b: Secondary | ICD-10-CM | POA: Diagnosis not present

## 2023-04-02 DIAGNOSIS — N1832 Chronic kidney disease, stage 3b: Secondary | ICD-10-CM | POA: Diagnosis not present

## 2023-04-02 DIAGNOSIS — D509 Iron deficiency anemia, unspecified: Secondary | ICD-10-CM | POA: Diagnosis not present

## 2023-04-02 DIAGNOSIS — F32A Depression, unspecified: Secondary | ICD-10-CM | POA: Diagnosis not present

## 2023-04-02 DIAGNOSIS — E785 Hyperlipidemia, unspecified: Secondary | ICD-10-CM | POA: Diagnosis not present

## 2023-04-02 DIAGNOSIS — I509 Heart failure, unspecified: Secondary | ICD-10-CM | POA: Diagnosis not present

## 2023-04-02 DIAGNOSIS — G20A1 Parkinson's disease without dyskinesia, without mention of fluctuations: Secondary | ICD-10-CM | POA: Diagnosis not present

## 2023-04-03 DIAGNOSIS — I509 Heart failure, unspecified: Secondary | ICD-10-CM | POA: Diagnosis not present

## 2023-04-03 DIAGNOSIS — E785 Hyperlipidemia, unspecified: Secondary | ICD-10-CM | POA: Diagnosis not present

## 2023-04-03 DIAGNOSIS — F32A Depression, unspecified: Secondary | ICD-10-CM | POA: Diagnosis not present

## 2023-04-03 DIAGNOSIS — D509 Iron deficiency anemia, unspecified: Secondary | ICD-10-CM | POA: Diagnosis not present

## 2023-04-03 DIAGNOSIS — N1832 Chronic kidney disease, stage 3b: Secondary | ICD-10-CM | POA: Diagnosis not present

## 2023-04-03 DIAGNOSIS — G20A1 Parkinson's disease without dyskinesia, without mention of fluctuations: Secondary | ICD-10-CM | POA: Diagnosis not present

## 2023-04-04 DIAGNOSIS — I509 Heart failure, unspecified: Secondary | ICD-10-CM | POA: Diagnosis not present

## 2023-04-04 DIAGNOSIS — E785 Hyperlipidemia, unspecified: Secondary | ICD-10-CM | POA: Diagnosis not present

## 2023-04-04 DIAGNOSIS — N1832 Chronic kidney disease, stage 3b: Secondary | ICD-10-CM | POA: Diagnosis not present

## 2023-04-04 DIAGNOSIS — D509 Iron deficiency anemia, unspecified: Secondary | ICD-10-CM | POA: Diagnosis not present

## 2023-04-04 DIAGNOSIS — G20A1 Parkinson's disease without dyskinesia, without mention of fluctuations: Secondary | ICD-10-CM | POA: Diagnosis not present

## 2023-04-04 DIAGNOSIS — F32A Depression, unspecified: Secondary | ICD-10-CM | POA: Diagnosis not present

## 2023-04-05 DIAGNOSIS — F32A Depression, unspecified: Secondary | ICD-10-CM | POA: Diagnosis not present

## 2023-04-05 DIAGNOSIS — I509 Heart failure, unspecified: Secondary | ICD-10-CM | POA: Diagnosis not present

## 2023-04-05 DIAGNOSIS — G20A1 Parkinson's disease without dyskinesia, without mention of fluctuations: Secondary | ICD-10-CM | POA: Diagnosis not present

## 2023-04-05 DIAGNOSIS — D509 Iron deficiency anemia, unspecified: Secondary | ICD-10-CM | POA: Diagnosis not present

## 2023-04-05 DIAGNOSIS — E785 Hyperlipidemia, unspecified: Secondary | ICD-10-CM | POA: Diagnosis not present

## 2023-04-05 DIAGNOSIS — N1832 Chronic kidney disease, stage 3b: Secondary | ICD-10-CM | POA: Diagnosis not present

## 2023-04-06 DIAGNOSIS — F32A Depression, unspecified: Secondary | ICD-10-CM | POA: Diagnosis not present

## 2023-04-06 DIAGNOSIS — D509 Iron deficiency anemia, unspecified: Secondary | ICD-10-CM | POA: Diagnosis not present

## 2023-04-06 DIAGNOSIS — I509 Heart failure, unspecified: Secondary | ICD-10-CM | POA: Diagnosis not present

## 2023-04-06 DIAGNOSIS — G20A1 Parkinson's disease without dyskinesia, without mention of fluctuations: Secondary | ICD-10-CM | POA: Diagnosis not present

## 2023-04-06 DIAGNOSIS — N1832 Chronic kidney disease, stage 3b: Secondary | ICD-10-CM | POA: Diagnosis not present

## 2023-04-06 DIAGNOSIS — E785 Hyperlipidemia, unspecified: Secondary | ICD-10-CM | POA: Diagnosis not present

## 2023-04-07 DIAGNOSIS — D509 Iron deficiency anemia, unspecified: Secondary | ICD-10-CM | POA: Diagnosis not present

## 2023-04-07 DIAGNOSIS — G20A1 Parkinson's disease without dyskinesia, without mention of fluctuations: Secondary | ICD-10-CM | POA: Diagnosis not present

## 2023-04-07 DIAGNOSIS — F32A Depression, unspecified: Secondary | ICD-10-CM | POA: Diagnosis not present

## 2023-04-07 DIAGNOSIS — N1832 Chronic kidney disease, stage 3b: Secondary | ICD-10-CM | POA: Diagnosis not present

## 2023-04-07 DIAGNOSIS — E785 Hyperlipidemia, unspecified: Secondary | ICD-10-CM | POA: Diagnosis not present

## 2023-04-07 DIAGNOSIS — I509 Heart failure, unspecified: Secondary | ICD-10-CM | POA: Diagnosis not present

## 2023-04-08 DIAGNOSIS — G20A1 Parkinson's disease without dyskinesia, without mention of fluctuations: Secondary | ICD-10-CM | POA: Diagnosis not present

## 2023-04-08 DIAGNOSIS — N1832 Chronic kidney disease, stage 3b: Secondary | ICD-10-CM | POA: Diagnosis not present

## 2023-04-08 DIAGNOSIS — E785 Hyperlipidemia, unspecified: Secondary | ICD-10-CM | POA: Diagnosis not present

## 2023-04-08 DIAGNOSIS — I509 Heart failure, unspecified: Secondary | ICD-10-CM | POA: Diagnosis not present

## 2023-04-08 DIAGNOSIS — D509 Iron deficiency anemia, unspecified: Secondary | ICD-10-CM | POA: Diagnosis not present

## 2023-04-08 DIAGNOSIS — F32A Depression, unspecified: Secondary | ICD-10-CM | POA: Diagnosis not present

## 2023-04-09 DIAGNOSIS — G20A1 Parkinson's disease without dyskinesia, without mention of fluctuations: Secondary | ICD-10-CM | POA: Diagnosis not present

## 2023-04-09 DIAGNOSIS — F32A Depression, unspecified: Secondary | ICD-10-CM | POA: Diagnosis not present

## 2023-04-09 DIAGNOSIS — D509 Iron deficiency anemia, unspecified: Secondary | ICD-10-CM | POA: Diagnosis not present

## 2023-04-09 DIAGNOSIS — I509 Heart failure, unspecified: Secondary | ICD-10-CM | POA: Diagnosis not present

## 2023-04-09 DIAGNOSIS — E785 Hyperlipidemia, unspecified: Secondary | ICD-10-CM | POA: Diagnosis not present

## 2023-04-09 DIAGNOSIS — N1832 Chronic kidney disease, stage 3b: Secondary | ICD-10-CM | POA: Diagnosis not present

## 2023-04-09 NOTE — Progress Notes (Signed)
Assessment/Plan:   1.  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  -cannot do MRI brain due to PPM that is not MRI compatible.  -DaT scan positive, with decreased uptake in bilateral putamen  -Has a power wheelchair, but does not use it anymore  -she is working with physical therapy some on transfers.  Do not recommend walking given retropulsion and experience with this in the past.  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  We discussed the stress component.  Her daughter thanked [place] for the conversation, as she was unaware that about the functional tremor component, which is clearly the most disabling component.  Most of the tremor seen today was functional in quality, as with prior visits.  We discussed counseling in detail again today.  She should be able to use hospice counseling services.  -Discussed skin biopsies.  Discussed what they would offer and what it would not.  She has declined.  -Status post hip fracture in May, 2024.  -Discussed with patient, caregiver, daughter that I do not recommend that she continue living in assisted living alone and I think she needs a higher level of care.  2.  Orthostatic hypotension  -Following with cardiology.  No longer on Lasix.  Metoprolol was previously cut in half.  I wonder if she still needs it as she is feeling very tired and her blood pressure is still quite low.  Told her she needed to follow-up with cardiology.    3.  GAD/depression  -on buspar  -On Lexapro, 10 mg daily  4.  Confusion  -Off of oxycodone, but on tramadol, which certainly can also contribute to confusion.  -I think that the Zanaflex, 4 mg 3 times per day that is prescribed at hospice is also contributing to confusion  -[name] is going to talk to hospice about the tramadol and the Zanaflex.  -Discussed with patient, daughter, caregiver (all present in the room) that she  needs to stay active during the day and discussed exactly what type of activities could help her brain.  She is currently sitting around and not doing much and "looking at the window all day."  5.  Dysphagia  - MBE was completed on March 06, 2021.  Swallowing function was worse than 1 year prior.  There was evidence of oropharyngeal and pharyngeal esophageal phase dysphagia mechanical soft solids, thin liquids, meds crushed with pure recommended.  Patient thinks that this is getting worse.  Was going to consider repeating this, but since she decided to make herself DNR, I decided to hold on that until they have further family discussions.  6.  Suspect rebound headache  She is getting scheduled tramadol 3 times per day.  I think that is likely causing rebound headache.  I would recommend that we actually get rid of the tramadol and not use it more than 2-3 days/week for headache.  We discussed that headache will first increase before it decreases, but otherwise we are just going to be in a vicious cycle.   Subjective:   [name] was seen today in follow up retropulsion, parkinsonism and functional tremor.  Pt with daughter who supplements hx.  Patient was admitted to the hospital for a few days in May due to fall.  Unfortunately, she sustained a right hip fracture.  She had a UTI at the time.  Patient underwent right total hip arthroplasty on Dec 10, 2022.  Patient  was discharged back to the facility on Dec 12, 2022.  Daughter states that PT has been "hit or miss" in part because pt may not feel good or not want to participate.  Records indicate that she was transferred back to memory care but daughter states that she is just in assisted living.  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  She has a button to press for the CNA when she needs to use the RR.  Daughter states that she has more trouble transferring.  She was doing well right after  hospitalization for transfers but not as good now.  Pt admits to some hallucinations - saw someone sleeping on her couch.  That has happened other times but doesn't happen daily.  Pt is taking tramadol tid for headache.    Current movement disorder medications: Lexapro, 10 mg daily  Prior medications: Levodopa (discontinued because not helpful)  ALLERGIES:   Allergies  Allergen Reactions   Statins Other (See Comments)    Lipitor caused hospitalization - - depletion of electrolytes, fever, nausea, loss of appetite, couldn't get out of bed   Cymbalta [Duloxetine Hcl] Other (See Comments)    Dulled her too much, difficulty urinating, change in vision- "Allergic," per [name]   Fosamax [Alendronate Sodium] Other (See Comments)    Caused chronic issues swallowing   Amitriptyline Other (See Comments)    Hallucinations    Fosamax [Alendronate] Other (See Comments)    "Allergic," per [name]   Lyrica [Pregabalin] Other (See Comments)    "Allergic," per [name]   Ranitidine Other (See Comments)    "Allergic," per [name]   Neurontin [Gabapentin] Other (See Comments)    Dizziness and sedation (patient is tolerating in lower dose)- "Allergic," per [name]   Zanaflex [Tizanidine] Other (See Comments)    Could not sleep    CURRENT MEDICATIONS:  Outpatient Encounter Medications as of 04/14/2023  Medication Sig   acetaminophen (TYLENOL) 500 MG tablet Take 500 mg by mouth every 8 (eight) hours as needed for mild pain.   cyanocobalamin (VITAMIN B12) 1000 MCG tablet Take 1,000 mcg by mouth daily.   escitalopram (LEXAPRO) 10 MG tablet Take one tablet daily   LORazepam (ATIVAN) 0.5 MG tablet Take 0.5 mg by mouth 3 (three) times daily between meals as needed for anxiety.   Misc. Devices [name]) MISC Power Wheelchair   omeprazole (PRILOSEC) 40 MG capsule TAKE 1 CAPSULE BY MOUTH TWICE A DAY FOR GERD. (Patient taking differently: Take 40 mg by mouth 2 (two) times daily before a meal.)   polyethylene glycol (MIRALAX  / GLYCOLAX) packet Take 17 g by mouth daily as needed (constipation- MIX AND DRINK AS DIRECTED).   prochlorperazine (COMPAZINE) 10 MG tablet Take 10 mg by mouth every 6 (six) hours as needed for nausea or vomiting.   senna-docusate (SENOKOT-S) 8.6-50 MG tablet Take 1 tablet by mouth 2 (two) times daily between meals as needed for mild constipation.   tiZANidine (ZANAFLEX) 4 MG capsule Take 4 mg by mouth 3 (three) times daily. Take 1 tablet every 8 hours as needed for muscle spasms   traMADol (ULTRAM) 50 MG tablet Take 25 mg by mouth 3 (three) times daily.   amLODipine (NORVASC) 5 MG tablet Take 1 tablet (5 mg total) by mouth daily.   No facility-administered encounter medications on file as of 04/14/2023.    Objective:   PHYSICAL EXAMINATION:    VITALS:   Vitals:   04/14/23  1448  BP: 124/82  Pulse: (!) 58  SpO2: 95%  Weight: 125 lb 8 oz (56.9 kg)  Height: 5\' 1"  (1.549 m)     No data found.   GEN:  The patient appears stated age and is in NAD. HEENT:  Normocephalic, atraumatic.  The mucous membranes are moist.  Head/neck to the right Cardiovascular: Bradycardic.  Regular. Lungs: Clear to auscultation bilaterally Neurological examination:  Orientation: The patient is alert and oriented x3. Cranial nerves: There is good facial symmetry with facial hypomimia.  There are sq wave jerks.  Extraocular muscles are intact.  She does have decreased blink.  The speech is fluent and clear.  Abnormal movements: There is LEFT UPPER EXTREMITY rest tremor.  There is also functional tremor that is more exaggerated and there is entrainment associated with that.  This is consistent with prior visits Gait:  not tested today due to the fact that patient comes in a transport chair and no longer ambulates and even has trouble with transfers. I have reviewed and interpreted the following labs independently    Chemistry      Component Value Date/Time   NA 137 12/12/2022 0349   NA 139 03/12/2022 1052    K 3.6 12/12/2022 0349   CL 109 12/12/2022 0349   CO2 20 (L) 12/12/2022 0349   BUN 21 12/12/2022 0349   BUN 16 03/12/2022 1052   CREATININE 0.79 12/12/2022 0349   CREATININE 1.23 (H) 03/30/2020 1011      Component Value Date/Time   CALCIUM 8.0 (L) 12/12/2022 0349   ALKPHOS 34 (L) 01/15/2021 1459   AST 12 01/15/2021 1459   ALT 4 01/15/2021 1459   BILITOT 0.3 01/15/2021 1459       Lab Results  Component Value Date   WBC 9.7 12/12/2022   HGB 8.8 (L) 12/12/2022   HCT 27.1 (L) 12/12/2022   MCV 98.9 12/12/2022   PLT 248 12/12/2022    Lab Results  Component Value Date   TSH 1.85 02/03/2020     Total time spent on today's visit was 35 minutes, including both face-to-face time and nonface-to-face time.  Time included that spent on review of records (prior notes available to me/labs/imaging if pertinent), discussing treatment and goals, answering patient's questions and coordinating care.  Cc:  Pcp, No

## 2023-04-10 DIAGNOSIS — F32A Depression, unspecified: Secondary | ICD-10-CM | POA: Diagnosis not present

## 2023-04-10 DIAGNOSIS — N1832 Chronic kidney disease, stage 3b: Secondary | ICD-10-CM | POA: Diagnosis not present

## 2023-04-10 DIAGNOSIS — I509 Heart failure, unspecified: Secondary | ICD-10-CM | POA: Diagnosis not present

## 2023-04-10 DIAGNOSIS — E785 Hyperlipidemia, unspecified: Secondary | ICD-10-CM | POA: Diagnosis not present

## 2023-04-10 DIAGNOSIS — D509 Iron deficiency anemia, unspecified: Secondary | ICD-10-CM | POA: Diagnosis not present

## 2023-04-10 DIAGNOSIS — G20A1 Parkinson's disease without dyskinesia, without mention of fluctuations: Secondary | ICD-10-CM | POA: Diagnosis not present

## 2023-04-11 DIAGNOSIS — D509 Iron deficiency anemia, unspecified: Secondary | ICD-10-CM | POA: Diagnosis not present

## 2023-04-11 DIAGNOSIS — I509 Heart failure, unspecified: Secondary | ICD-10-CM | POA: Diagnosis not present

## 2023-04-11 DIAGNOSIS — E785 Hyperlipidemia, unspecified: Secondary | ICD-10-CM | POA: Diagnosis not present

## 2023-04-11 DIAGNOSIS — N1832 Chronic kidney disease, stage 3b: Secondary | ICD-10-CM | POA: Diagnosis not present

## 2023-04-11 DIAGNOSIS — F32A Depression, unspecified: Secondary | ICD-10-CM | POA: Diagnosis not present

## 2023-04-11 DIAGNOSIS — G20A1 Parkinson's disease without dyskinesia, without mention of fluctuations: Secondary | ICD-10-CM | POA: Diagnosis not present

## 2023-04-12 DIAGNOSIS — F32A Depression, unspecified: Secondary | ICD-10-CM | POA: Diagnosis not present

## 2023-04-12 DIAGNOSIS — I509 Heart failure, unspecified: Secondary | ICD-10-CM | POA: Diagnosis not present

## 2023-04-12 DIAGNOSIS — D509 Iron deficiency anemia, unspecified: Secondary | ICD-10-CM | POA: Diagnosis not present

## 2023-04-12 DIAGNOSIS — G20A1 Parkinson's disease without dyskinesia, without mention of fluctuations: Secondary | ICD-10-CM | POA: Diagnosis not present

## 2023-04-12 DIAGNOSIS — N1832 Chronic kidney disease, stage 3b: Secondary | ICD-10-CM | POA: Diagnosis not present

## 2023-04-12 DIAGNOSIS — E785 Hyperlipidemia, unspecified: Secondary | ICD-10-CM | POA: Diagnosis not present

## 2023-04-13 DIAGNOSIS — D509 Iron deficiency anemia, unspecified: Secondary | ICD-10-CM | POA: Diagnosis not present

## 2023-04-13 DIAGNOSIS — N1832 Chronic kidney disease, stage 3b: Secondary | ICD-10-CM | POA: Diagnosis not present

## 2023-04-13 DIAGNOSIS — G20A1 Parkinson's disease without dyskinesia, without mention of fluctuations: Secondary | ICD-10-CM | POA: Diagnosis not present

## 2023-04-13 DIAGNOSIS — I509 Heart failure, unspecified: Secondary | ICD-10-CM | POA: Diagnosis not present

## 2023-04-13 DIAGNOSIS — F32A Depression, unspecified: Secondary | ICD-10-CM | POA: Diagnosis not present

## 2023-04-13 DIAGNOSIS — E785 Hyperlipidemia, unspecified: Secondary | ICD-10-CM | POA: Diagnosis not present

## 2023-04-14 ENCOUNTER — Ambulatory Visit (INDEPENDENT_AMBULATORY_CARE_PROVIDER_SITE_OTHER): Payer: Medicare Other | Admitting: Neurology

## 2023-04-14 VITALS — BP 124/82 | HR 58 | Ht 61.0 in | Wt 125.5 lb

## 2023-04-14 DIAGNOSIS — R251 Tremor, unspecified: Secondary | ICD-10-CM

## 2023-04-14 DIAGNOSIS — G20C Parkinsonism, unspecified: Secondary | ICD-10-CM

## 2023-04-14 DIAGNOSIS — E785 Hyperlipidemia, unspecified: Secondary | ICD-10-CM | POA: Diagnosis not present

## 2023-04-14 DIAGNOSIS — N1832 Chronic kidney disease, stage 3b: Secondary | ICD-10-CM | POA: Diagnosis not present

## 2023-04-14 DIAGNOSIS — I509 Heart failure, unspecified: Secondary | ICD-10-CM | POA: Diagnosis not present

## 2023-04-14 DIAGNOSIS — D509 Iron deficiency anemia, unspecified: Secondary | ICD-10-CM | POA: Diagnosis not present

## 2023-04-14 DIAGNOSIS — F32A Depression, unspecified: Secondary | ICD-10-CM | POA: Diagnosis not present

## 2023-04-14 DIAGNOSIS — R410 Disorientation, unspecified: Secondary | ICD-10-CM | POA: Diagnosis not present

## 2023-04-14 DIAGNOSIS — G20A1 Parkinson's disease without dyskinesia, without mention of fluctuations: Secondary | ICD-10-CM | POA: Diagnosis not present

## 2023-04-14 NOTE — Patient Instructions (Signed)
Talk to your hospice provider regarding the tramadol.  I think it is causing rebound headache and confusions/hallucinations.

## 2023-04-15 DIAGNOSIS — D509 Iron deficiency anemia, unspecified: Secondary | ICD-10-CM | POA: Diagnosis not present

## 2023-04-15 DIAGNOSIS — G20A1 Parkinson's disease without dyskinesia, without mention of fluctuations: Secondary | ICD-10-CM | POA: Diagnosis not present

## 2023-04-15 DIAGNOSIS — E785 Hyperlipidemia, unspecified: Secondary | ICD-10-CM | POA: Diagnosis not present

## 2023-04-15 DIAGNOSIS — N1832 Chronic kidney disease, stage 3b: Secondary | ICD-10-CM | POA: Diagnosis not present

## 2023-04-15 DIAGNOSIS — I509 Heart failure, unspecified: Secondary | ICD-10-CM | POA: Diagnosis not present

## 2023-04-15 DIAGNOSIS — F32A Depression, unspecified: Secondary | ICD-10-CM | POA: Diagnosis not present

## 2023-04-16 DIAGNOSIS — E785 Hyperlipidemia, unspecified: Secondary | ICD-10-CM | POA: Diagnosis not present

## 2023-04-16 DIAGNOSIS — D509 Iron deficiency anemia, unspecified: Secondary | ICD-10-CM | POA: Diagnosis not present

## 2023-04-16 DIAGNOSIS — G20A1 Parkinson's disease without dyskinesia, without mention of fluctuations: Secondary | ICD-10-CM | POA: Diagnosis not present

## 2023-04-16 DIAGNOSIS — F32A Depression, unspecified: Secondary | ICD-10-CM | POA: Diagnosis not present

## 2023-04-16 DIAGNOSIS — I509 Heart failure, unspecified: Secondary | ICD-10-CM | POA: Diagnosis not present

## 2023-04-16 DIAGNOSIS — N1832 Chronic kidney disease, stage 3b: Secondary | ICD-10-CM | POA: Diagnosis not present

## 2023-04-17 DIAGNOSIS — F32A Depression, unspecified: Secondary | ICD-10-CM | POA: Diagnosis not present

## 2023-04-17 DIAGNOSIS — I509 Heart failure, unspecified: Secondary | ICD-10-CM | POA: Diagnosis not present

## 2023-04-17 DIAGNOSIS — N1832 Chronic kidney disease, stage 3b: Secondary | ICD-10-CM | POA: Diagnosis not present

## 2023-04-17 DIAGNOSIS — G20A1 Parkinson's disease without dyskinesia, without mention of fluctuations: Secondary | ICD-10-CM | POA: Diagnosis not present

## 2023-04-17 DIAGNOSIS — E785 Hyperlipidemia, unspecified: Secondary | ICD-10-CM | POA: Diagnosis not present

## 2023-04-17 DIAGNOSIS — D509 Iron deficiency anemia, unspecified: Secondary | ICD-10-CM | POA: Diagnosis not present

## 2023-04-18 DIAGNOSIS — G20A1 Parkinson's disease without dyskinesia, without mention of fluctuations: Secondary | ICD-10-CM | POA: Diagnosis not present

## 2023-04-18 DIAGNOSIS — N1832 Chronic kidney disease, stage 3b: Secondary | ICD-10-CM | POA: Diagnosis not present

## 2023-04-18 DIAGNOSIS — F32A Depression, unspecified: Secondary | ICD-10-CM | POA: Diagnosis not present

## 2023-04-18 DIAGNOSIS — I509 Heart failure, unspecified: Secondary | ICD-10-CM | POA: Diagnosis not present

## 2023-04-18 DIAGNOSIS — D509 Iron deficiency anemia, unspecified: Secondary | ICD-10-CM | POA: Diagnosis not present

## 2023-04-18 DIAGNOSIS — E785 Hyperlipidemia, unspecified: Secondary | ICD-10-CM | POA: Diagnosis not present

## 2023-04-19 DIAGNOSIS — E785 Hyperlipidemia, unspecified: Secondary | ICD-10-CM | POA: Diagnosis not present

## 2023-04-19 DIAGNOSIS — I509 Heart failure, unspecified: Secondary | ICD-10-CM | POA: Diagnosis not present

## 2023-04-19 DIAGNOSIS — G20A1 Parkinson's disease without dyskinesia, without mention of fluctuations: Secondary | ICD-10-CM | POA: Diagnosis not present

## 2023-04-19 DIAGNOSIS — F32A Depression, unspecified: Secondary | ICD-10-CM | POA: Diagnosis not present

## 2023-04-19 DIAGNOSIS — D509 Iron deficiency anemia, unspecified: Secondary | ICD-10-CM | POA: Diagnosis not present

## 2023-04-19 DIAGNOSIS — N1832 Chronic kidney disease, stage 3b: Secondary | ICD-10-CM | POA: Diagnosis not present

## 2023-04-20 DIAGNOSIS — D509 Iron deficiency anemia, unspecified: Secondary | ICD-10-CM | POA: Diagnosis not present

## 2023-04-20 DIAGNOSIS — F32A Depression, unspecified: Secondary | ICD-10-CM | POA: Diagnosis not present

## 2023-04-20 DIAGNOSIS — N1832 Chronic kidney disease, stage 3b: Secondary | ICD-10-CM | POA: Diagnosis not present

## 2023-04-20 DIAGNOSIS — I509 Heart failure, unspecified: Secondary | ICD-10-CM | POA: Diagnosis not present

## 2023-04-20 DIAGNOSIS — E785 Hyperlipidemia, unspecified: Secondary | ICD-10-CM | POA: Diagnosis not present

## 2023-04-20 DIAGNOSIS — G20A1 Parkinson's disease without dyskinesia, without mention of fluctuations: Secondary | ICD-10-CM | POA: Diagnosis not present

## 2023-04-21 DIAGNOSIS — I509 Heart failure, unspecified: Secondary | ICD-10-CM | POA: Diagnosis not present

## 2023-04-21 DIAGNOSIS — F32A Depression, unspecified: Secondary | ICD-10-CM | POA: Diagnosis not present

## 2023-04-21 DIAGNOSIS — E785 Hyperlipidemia, unspecified: Secondary | ICD-10-CM | POA: Diagnosis not present

## 2023-04-21 DIAGNOSIS — G20A1 Parkinson's disease without dyskinesia, without mention of fluctuations: Secondary | ICD-10-CM | POA: Diagnosis not present

## 2023-04-21 DIAGNOSIS — N1832 Chronic kidney disease, stage 3b: Secondary | ICD-10-CM | POA: Diagnosis not present

## 2023-04-21 DIAGNOSIS — D509 Iron deficiency anemia, unspecified: Secondary | ICD-10-CM | POA: Diagnosis not present

## 2023-04-22 DIAGNOSIS — D509 Iron deficiency anemia, unspecified: Secondary | ICD-10-CM | POA: Diagnosis not present

## 2023-04-22 DIAGNOSIS — I509 Heart failure, unspecified: Secondary | ICD-10-CM | POA: Diagnosis not present

## 2023-04-22 DIAGNOSIS — G20A1 Parkinson's disease without dyskinesia, without mention of fluctuations: Secondary | ICD-10-CM | POA: Diagnosis not present

## 2023-04-22 DIAGNOSIS — N1832 Chronic kidney disease, stage 3b: Secondary | ICD-10-CM | POA: Diagnosis not present

## 2023-04-22 DIAGNOSIS — F32A Depression, unspecified: Secondary | ICD-10-CM | POA: Diagnosis not present

## 2023-04-22 DIAGNOSIS — E785 Hyperlipidemia, unspecified: Secondary | ICD-10-CM | POA: Diagnosis not present

## 2023-04-23 DIAGNOSIS — D509 Iron deficiency anemia, unspecified: Secondary | ICD-10-CM | POA: Diagnosis not present

## 2023-04-23 DIAGNOSIS — E785 Hyperlipidemia, unspecified: Secondary | ICD-10-CM | POA: Diagnosis not present

## 2023-04-23 DIAGNOSIS — N1832 Chronic kidney disease, stage 3b: Secondary | ICD-10-CM | POA: Diagnosis not present

## 2023-04-23 DIAGNOSIS — I509 Heart failure, unspecified: Secondary | ICD-10-CM | POA: Diagnosis not present

## 2023-04-23 DIAGNOSIS — F32A Depression, unspecified: Secondary | ICD-10-CM | POA: Diagnosis not present

## 2023-04-23 DIAGNOSIS — G20A1 Parkinson's disease without dyskinesia, without mention of fluctuations: Secondary | ICD-10-CM | POA: Diagnosis not present

## 2023-04-24 DIAGNOSIS — G20A1 Parkinson's disease without dyskinesia, without mention of fluctuations: Secondary | ICD-10-CM | POA: Diagnosis not present

## 2023-04-24 DIAGNOSIS — N1832 Chronic kidney disease, stage 3b: Secondary | ICD-10-CM | POA: Diagnosis not present

## 2023-04-24 DIAGNOSIS — E785 Hyperlipidemia, unspecified: Secondary | ICD-10-CM | POA: Diagnosis not present

## 2023-04-24 DIAGNOSIS — D509 Iron deficiency anemia, unspecified: Secondary | ICD-10-CM | POA: Diagnosis not present

## 2023-04-24 DIAGNOSIS — F32A Depression, unspecified: Secondary | ICD-10-CM | POA: Diagnosis not present

## 2023-04-24 DIAGNOSIS — I509 Heart failure, unspecified: Secondary | ICD-10-CM | POA: Diagnosis not present

## 2023-04-25 DIAGNOSIS — D509 Iron deficiency anemia, unspecified: Secondary | ICD-10-CM | POA: Diagnosis not present

## 2023-04-25 DIAGNOSIS — E785 Hyperlipidemia, unspecified: Secondary | ICD-10-CM | POA: Diagnosis not present

## 2023-04-25 DIAGNOSIS — I509 Heart failure, unspecified: Secondary | ICD-10-CM | POA: Diagnosis not present

## 2023-04-25 DIAGNOSIS — G20A1 Parkinson's disease without dyskinesia, without mention of fluctuations: Secondary | ICD-10-CM | POA: Diagnosis not present

## 2023-04-25 DIAGNOSIS — N1832 Chronic kidney disease, stage 3b: Secondary | ICD-10-CM | POA: Diagnosis not present

## 2023-04-25 DIAGNOSIS — F32A Depression, unspecified: Secondary | ICD-10-CM | POA: Diagnosis not present

## 2023-04-26 DIAGNOSIS — D509 Iron deficiency anemia, unspecified: Secondary | ICD-10-CM | POA: Diagnosis not present

## 2023-04-26 DIAGNOSIS — N1832 Chronic kidney disease, stage 3b: Secondary | ICD-10-CM | POA: Diagnosis not present

## 2023-04-26 DIAGNOSIS — E785 Hyperlipidemia, unspecified: Secondary | ICD-10-CM | POA: Diagnosis not present

## 2023-04-26 DIAGNOSIS — G20A1 Parkinson's disease without dyskinesia, without mention of fluctuations: Secondary | ICD-10-CM | POA: Diagnosis not present

## 2023-04-26 DIAGNOSIS — F32A Depression, unspecified: Secondary | ICD-10-CM | POA: Diagnosis not present

## 2023-04-26 DIAGNOSIS — I509 Heart failure, unspecified: Secondary | ICD-10-CM | POA: Diagnosis not present

## 2023-04-27 DIAGNOSIS — D509 Iron deficiency anemia, unspecified: Secondary | ICD-10-CM | POA: Diagnosis not present

## 2023-04-27 DIAGNOSIS — I509 Heart failure, unspecified: Secondary | ICD-10-CM | POA: Diagnosis not present

## 2023-04-27 DIAGNOSIS — N1832 Chronic kidney disease, stage 3b: Secondary | ICD-10-CM | POA: Diagnosis not present

## 2023-04-27 DIAGNOSIS — F32A Depression, unspecified: Secondary | ICD-10-CM | POA: Diagnosis not present

## 2023-04-27 DIAGNOSIS — E785 Hyperlipidemia, unspecified: Secondary | ICD-10-CM | POA: Diagnosis not present

## 2023-04-27 DIAGNOSIS — G20A1 Parkinson's disease without dyskinesia, without mention of fluctuations: Secondary | ICD-10-CM | POA: Diagnosis not present

## 2023-04-28 DIAGNOSIS — E785 Hyperlipidemia, unspecified: Secondary | ICD-10-CM | POA: Diagnosis not present

## 2023-04-28 DIAGNOSIS — G8929 Other chronic pain: Secondary | ICD-10-CM | POA: Diagnosis not present

## 2023-04-28 DIAGNOSIS — I5032 Chronic diastolic (congestive) heart failure: Secondary | ICD-10-CM | POA: Diagnosis not present

## 2023-04-28 DIAGNOSIS — M810 Age-related osteoporosis without current pathological fracture: Secondary | ICD-10-CM | POA: Diagnosis not present

## 2023-04-28 DIAGNOSIS — G20A1 Parkinson's disease without dyskinesia, without mention of fluctuations: Secondary | ICD-10-CM | POA: Diagnosis not present

## 2023-04-28 DIAGNOSIS — Z515 Encounter for palliative care: Secondary | ICD-10-CM | POA: Diagnosis not present

## 2023-04-28 DIAGNOSIS — D509 Iron deficiency anemia, unspecified: Secondary | ICD-10-CM | POA: Diagnosis not present

## 2023-04-28 DIAGNOSIS — I959 Hypotension, unspecified: Secondary | ICD-10-CM | POA: Diagnosis not present

## 2023-04-28 DIAGNOSIS — K219 Gastro-esophageal reflux disease without esophagitis: Secondary | ICD-10-CM | POA: Diagnosis not present

## 2023-04-28 DIAGNOSIS — F0390 Unspecified dementia without behavioral disturbance: Secondary | ICD-10-CM | POA: Diagnosis not present

## 2023-04-28 DIAGNOSIS — G231 Progressive supranuclear ophthalmoplegia [Steele-Richardson-Olszewski]: Secondary | ICD-10-CM | POA: Diagnosis not present

## 2023-04-28 DIAGNOSIS — R519 Headache, unspecified: Secondary | ICD-10-CM | POA: Diagnosis not present

## 2023-04-28 DIAGNOSIS — I509 Heart failure, unspecified: Secondary | ICD-10-CM | POA: Diagnosis not present

## 2023-04-28 DIAGNOSIS — F32A Depression, unspecified: Secondary | ICD-10-CM | POA: Diagnosis not present

## 2023-04-28 DIAGNOSIS — L309 Dermatitis, unspecified: Secondary | ICD-10-CM | POA: Diagnosis not present

## 2023-04-28 DIAGNOSIS — N1832 Chronic kidney disease, stage 3b: Secondary | ICD-10-CM | POA: Diagnosis not present

## 2023-04-29 DIAGNOSIS — E785 Hyperlipidemia, unspecified: Secondary | ICD-10-CM | POA: Diagnosis not present

## 2023-04-29 DIAGNOSIS — G20A1 Parkinson's disease without dyskinesia, without mention of fluctuations: Secondary | ICD-10-CM | POA: Diagnosis not present

## 2023-04-29 DIAGNOSIS — D509 Iron deficiency anemia, unspecified: Secondary | ICD-10-CM | POA: Diagnosis not present

## 2023-04-29 DIAGNOSIS — N1832 Chronic kidney disease, stage 3b: Secondary | ICD-10-CM | POA: Diagnosis not present

## 2023-04-29 DIAGNOSIS — I509 Heart failure, unspecified: Secondary | ICD-10-CM | POA: Diagnosis not present

## 2023-04-29 DIAGNOSIS — F32A Depression, unspecified: Secondary | ICD-10-CM | POA: Diagnosis not present

## 2023-04-30 DIAGNOSIS — D509 Iron deficiency anemia, unspecified: Secondary | ICD-10-CM | POA: Diagnosis not present

## 2023-04-30 DIAGNOSIS — F32A Depression, unspecified: Secondary | ICD-10-CM | POA: Diagnosis not present

## 2023-04-30 DIAGNOSIS — I509 Heart failure, unspecified: Secondary | ICD-10-CM | POA: Diagnosis not present

## 2023-04-30 DIAGNOSIS — N1832 Chronic kidney disease, stage 3b: Secondary | ICD-10-CM | POA: Diagnosis not present

## 2023-04-30 DIAGNOSIS — G20A1 Parkinson's disease without dyskinesia, without mention of fluctuations: Secondary | ICD-10-CM | POA: Diagnosis not present

## 2023-04-30 DIAGNOSIS — E785 Hyperlipidemia, unspecified: Secondary | ICD-10-CM | POA: Diagnosis not present

## 2023-05-01 ENCOUNTER — Ambulatory Visit (INDEPENDENT_AMBULATORY_CARE_PROVIDER_SITE_OTHER): Payer: Medicare Other

## 2023-05-01 DIAGNOSIS — G20A1 Parkinson's disease without dyskinesia, without mention of fluctuations: Secondary | ICD-10-CM | POA: Diagnosis not present

## 2023-05-01 DIAGNOSIS — E785 Hyperlipidemia, unspecified: Secondary | ICD-10-CM | POA: Diagnosis not present

## 2023-05-01 DIAGNOSIS — N1832 Chronic kidney disease, stage 3b: Secondary | ICD-10-CM | POA: Diagnosis not present

## 2023-05-01 DIAGNOSIS — F32A Depression, unspecified: Secondary | ICD-10-CM | POA: Diagnosis not present

## 2023-05-01 DIAGNOSIS — I495 Sick sinus syndrome: Secondary | ICD-10-CM

## 2023-05-01 DIAGNOSIS — I509 Heart failure, unspecified: Secondary | ICD-10-CM | POA: Diagnosis not present

## 2023-05-01 DIAGNOSIS — D509 Iron deficiency anemia, unspecified: Secondary | ICD-10-CM | POA: Diagnosis not present

## 2023-05-02 DIAGNOSIS — D509 Iron deficiency anemia, unspecified: Secondary | ICD-10-CM | POA: Diagnosis not present

## 2023-05-02 DIAGNOSIS — G20A1 Parkinson's disease without dyskinesia, without mention of fluctuations: Secondary | ICD-10-CM | POA: Diagnosis not present

## 2023-05-02 DIAGNOSIS — N1832 Chronic kidney disease, stage 3b: Secondary | ICD-10-CM | POA: Diagnosis not present

## 2023-05-02 DIAGNOSIS — I509 Heart failure, unspecified: Secondary | ICD-10-CM | POA: Diagnosis not present

## 2023-05-02 DIAGNOSIS — E785 Hyperlipidemia, unspecified: Secondary | ICD-10-CM | POA: Diagnosis not present

## 2023-05-02 DIAGNOSIS — F32A Depression, unspecified: Secondary | ICD-10-CM | POA: Diagnosis not present

## 2023-05-02 LAB — CUP PACEART REMOTE DEVICE CHECK
Battery Remaining Longevity: 90 mo
Battery Remaining Percentage: 91 %
Brady Statistic RA Percent Paced: 40 %
Brady Statistic RV Percent Paced: 1 %
Date Time Interrogation Session: 20241004030000
Implantable Lead Connection Status: 753985
Implantable Lead Connection Status: 753985
Implantable Lead Implant Date: 20171009
Implantable Lead Implant Date: 20171009
Implantable Lead Location: 753859
Implantable Lead Location: 753860
Implantable Lead Model: 5076
Implantable Lead Model: 7741
Implantable Lead Serial Number: 785430
Implantable Pulse Generator Implant Date: 20171009
Lead Channel Impedance Value: 1725 Ohm
Lead Channel Impedance Value: 535 Ohm
Lead Channel Setting Pacing Amplitude: 2 V
Lead Channel Setting Pacing Amplitude: 2.6 V
Lead Channel Setting Pacing Pulse Width: 1 ms
Lead Channel Setting Sensing Sensitivity: 2.5 mV
Pulse Gen Serial Number: 766467
Zone Setting Status: 755011

## 2023-05-03 DIAGNOSIS — I509 Heart failure, unspecified: Secondary | ICD-10-CM | POA: Diagnosis not present

## 2023-05-03 DIAGNOSIS — D509 Iron deficiency anemia, unspecified: Secondary | ICD-10-CM | POA: Diagnosis not present

## 2023-05-03 DIAGNOSIS — G20A1 Parkinson's disease without dyskinesia, without mention of fluctuations: Secondary | ICD-10-CM | POA: Diagnosis not present

## 2023-05-03 DIAGNOSIS — N1832 Chronic kidney disease, stage 3b: Secondary | ICD-10-CM | POA: Diagnosis not present

## 2023-05-03 DIAGNOSIS — E785 Hyperlipidemia, unspecified: Secondary | ICD-10-CM | POA: Diagnosis not present

## 2023-05-03 DIAGNOSIS — F32A Depression, unspecified: Secondary | ICD-10-CM | POA: Diagnosis not present

## 2023-05-04 DIAGNOSIS — E785 Hyperlipidemia, unspecified: Secondary | ICD-10-CM | POA: Diagnosis not present

## 2023-05-04 DIAGNOSIS — D509 Iron deficiency anemia, unspecified: Secondary | ICD-10-CM | POA: Diagnosis not present

## 2023-05-04 DIAGNOSIS — I509 Heart failure, unspecified: Secondary | ICD-10-CM | POA: Diagnosis not present

## 2023-05-04 DIAGNOSIS — N1832 Chronic kidney disease, stage 3b: Secondary | ICD-10-CM | POA: Diagnosis not present

## 2023-05-04 DIAGNOSIS — G20A1 Parkinson's disease without dyskinesia, without mention of fluctuations: Secondary | ICD-10-CM | POA: Diagnosis not present

## 2023-05-04 DIAGNOSIS — F32A Depression, unspecified: Secondary | ICD-10-CM | POA: Diagnosis not present

## 2023-05-05 DIAGNOSIS — G20A1 Parkinson's disease without dyskinesia, without mention of fluctuations: Secondary | ICD-10-CM | POA: Diagnosis not present

## 2023-05-05 DIAGNOSIS — E785 Hyperlipidemia, unspecified: Secondary | ICD-10-CM | POA: Diagnosis not present

## 2023-05-05 DIAGNOSIS — D509 Iron deficiency anemia, unspecified: Secondary | ICD-10-CM | POA: Diagnosis not present

## 2023-05-05 DIAGNOSIS — N1832 Chronic kidney disease, stage 3b: Secondary | ICD-10-CM | POA: Diagnosis not present

## 2023-05-05 DIAGNOSIS — F32A Depression, unspecified: Secondary | ICD-10-CM | POA: Diagnosis not present

## 2023-05-05 DIAGNOSIS — I509 Heart failure, unspecified: Secondary | ICD-10-CM | POA: Diagnosis not present

## 2023-05-06 DIAGNOSIS — F32A Depression, unspecified: Secondary | ICD-10-CM | POA: Diagnosis not present

## 2023-05-06 DIAGNOSIS — G20A1 Parkinson's disease without dyskinesia, without mention of fluctuations: Secondary | ICD-10-CM | POA: Diagnosis not present

## 2023-05-06 DIAGNOSIS — N1832 Chronic kidney disease, stage 3b: Secondary | ICD-10-CM | POA: Diagnosis not present

## 2023-05-06 DIAGNOSIS — D509 Iron deficiency anemia, unspecified: Secondary | ICD-10-CM | POA: Diagnosis not present

## 2023-05-06 DIAGNOSIS — E785 Hyperlipidemia, unspecified: Secondary | ICD-10-CM | POA: Diagnosis not present

## 2023-05-06 DIAGNOSIS — I509 Heart failure, unspecified: Secondary | ICD-10-CM | POA: Diagnosis not present

## 2023-05-07 DIAGNOSIS — D509 Iron deficiency anemia, unspecified: Secondary | ICD-10-CM | POA: Diagnosis not present

## 2023-05-07 DIAGNOSIS — E785 Hyperlipidemia, unspecified: Secondary | ICD-10-CM | POA: Diagnosis not present

## 2023-05-07 DIAGNOSIS — F32A Depression, unspecified: Secondary | ICD-10-CM | POA: Diagnosis not present

## 2023-05-07 DIAGNOSIS — G20A1 Parkinson's disease without dyskinesia, without mention of fluctuations: Secondary | ICD-10-CM | POA: Diagnosis not present

## 2023-05-07 DIAGNOSIS — N1832 Chronic kidney disease, stage 3b: Secondary | ICD-10-CM | POA: Diagnosis not present

## 2023-05-07 DIAGNOSIS — I509 Heart failure, unspecified: Secondary | ICD-10-CM | POA: Diagnosis not present

## 2023-05-08 DIAGNOSIS — G20A1 Parkinson's disease without dyskinesia, without mention of fluctuations: Secondary | ICD-10-CM | POA: Diagnosis not present

## 2023-05-08 DIAGNOSIS — D509 Iron deficiency anemia, unspecified: Secondary | ICD-10-CM | POA: Diagnosis not present

## 2023-05-08 DIAGNOSIS — I509 Heart failure, unspecified: Secondary | ICD-10-CM | POA: Diagnosis not present

## 2023-05-08 DIAGNOSIS — E785 Hyperlipidemia, unspecified: Secondary | ICD-10-CM | POA: Diagnosis not present

## 2023-05-08 DIAGNOSIS — F32A Depression, unspecified: Secondary | ICD-10-CM | POA: Diagnosis not present

## 2023-05-08 DIAGNOSIS — N1832 Chronic kidney disease, stage 3b: Secondary | ICD-10-CM | POA: Diagnosis not present

## 2023-05-09 DIAGNOSIS — G20A1 Parkinson's disease without dyskinesia, without mention of fluctuations: Secondary | ICD-10-CM | POA: Diagnosis not present

## 2023-05-09 DIAGNOSIS — I509 Heart failure, unspecified: Secondary | ICD-10-CM | POA: Diagnosis not present

## 2023-05-09 DIAGNOSIS — D509 Iron deficiency anemia, unspecified: Secondary | ICD-10-CM | POA: Diagnosis not present

## 2023-05-09 DIAGNOSIS — E785 Hyperlipidemia, unspecified: Secondary | ICD-10-CM | POA: Diagnosis not present

## 2023-05-09 DIAGNOSIS — N1832 Chronic kidney disease, stage 3b: Secondary | ICD-10-CM | POA: Diagnosis not present

## 2023-05-09 DIAGNOSIS — F32A Depression, unspecified: Secondary | ICD-10-CM | POA: Diagnosis not present

## 2023-05-10 DIAGNOSIS — F32A Depression, unspecified: Secondary | ICD-10-CM | POA: Diagnosis not present

## 2023-05-10 DIAGNOSIS — G20A1 Parkinson's disease without dyskinesia, without mention of fluctuations: Secondary | ICD-10-CM | POA: Diagnosis not present

## 2023-05-10 DIAGNOSIS — E785 Hyperlipidemia, unspecified: Secondary | ICD-10-CM | POA: Diagnosis not present

## 2023-05-10 DIAGNOSIS — D509 Iron deficiency anemia, unspecified: Secondary | ICD-10-CM | POA: Diagnosis not present

## 2023-05-10 DIAGNOSIS — I509 Heart failure, unspecified: Secondary | ICD-10-CM | POA: Diagnosis not present

## 2023-05-10 DIAGNOSIS — N1832 Chronic kidney disease, stage 3b: Secondary | ICD-10-CM | POA: Diagnosis not present

## 2023-05-11 DIAGNOSIS — G20A1 Parkinson's disease without dyskinesia, without mention of fluctuations: Secondary | ICD-10-CM | POA: Diagnosis not present

## 2023-05-11 DIAGNOSIS — N1832 Chronic kidney disease, stage 3b: Secondary | ICD-10-CM | POA: Diagnosis not present

## 2023-05-11 DIAGNOSIS — I509 Heart failure, unspecified: Secondary | ICD-10-CM | POA: Diagnosis not present

## 2023-05-11 DIAGNOSIS — E785 Hyperlipidemia, unspecified: Secondary | ICD-10-CM | POA: Diagnosis not present

## 2023-05-11 DIAGNOSIS — D509 Iron deficiency anemia, unspecified: Secondary | ICD-10-CM | POA: Diagnosis not present

## 2023-05-11 DIAGNOSIS — F32A Depression, unspecified: Secondary | ICD-10-CM | POA: Diagnosis not present

## 2023-05-12 DIAGNOSIS — E785 Hyperlipidemia, unspecified: Secondary | ICD-10-CM | POA: Diagnosis not present

## 2023-05-12 DIAGNOSIS — D509 Iron deficiency anemia, unspecified: Secondary | ICD-10-CM | POA: Diagnosis not present

## 2023-05-12 DIAGNOSIS — I509 Heart failure, unspecified: Secondary | ICD-10-CM | POA: Diagnosis not present

## 2023-05-12 DIAGNOSIS — N1832 Chronic kidney disease, stage 3b: Secondary | ICD-10-CM | POA: Diagnosis not present

## 2023-05-12 DIAGNOSIS — G20A1 Parkinson's disease without dyskinesia, without mention of fluctuations: Secondary | ICD-10-CM | POA: Diagnosis not present

## 2023-05-12 DIAGNOSIS — F32A Depression, unspecified: Secondary | ICD-10-CM | POA: Diagnosis not present

## 2023-05-13 DIAGNOSIS — D509 Iron deficiency anemia, unspecified: Secondary | ICD-10-CM | POA: Diagnosis not present

## 2023-05-13 DIAGNOSIS — N1832 Chronic kidney disease, stage 3b: Secondary | ICD-10-CM | POA: Diagnosis not present

## 2023-05-13 DIAGNOSIS — F32A Depression, unspecified: Secondary | ICD-10-CM | POA: Diagnosis not present

## 2023-05-13 DIAGNOSIS — G20A1 Parkinson's disease without dyskinesia, without mention of fluctuations: Secondary | ICD-10-CM | POA: Diagnosis not present

## 2023-05-13 DIAGNOSIS — I509 Heart failure, unspecified: Secondary | ICD-10-CM | POA: Diagnosis not present

## 2023-05-13 DIAGNOSIS — E785 Hyperlipidemia, unspecified: Secondary | ICD-10-CM | POA: Diagnosis not present

## 2023-05-14 DIAGNOSIS — E785 Hyperlipidemia, unspecified: Secondary | ICD-10-CM | POA: Diagnosis not present

## 2023-05-14 DIAGNOSIS — N1832 Chronic kidney disease, stage 3b: Secondary | ICD-10-CM | POA: Diagnosis not present

## 2023-05-14 DIAGNOSIS — F32A Depression, unspecified: Secondary | ICD-10-CM | POA: Diagnosis not present

## 2023-05-14 DIAGNOSIS — I509 Heart failure, unspecified: Secondary | ICD-10-CM | POA: Diagnosis not present

## 2023-05-14 DIAGNOSIS — D509 Iron deficiency anemia, unspecified: Secondary | ICD-10-CM | POA: Diagnosis not present

## 2023-05-14 DIAGNOSIS — G20A1 Parkinson's disease without dyskinesia, without mention of fluctuations: Secondary | ICD-10-CM | POA: Diagnosis not present

## 2023-05-14 NOTE — Progress Notes (Signed)
Remote pacemaker transmission.   

## 2023-05-15 DIAGNOSIS — I509 Heart failure, unspecified: Secondary | ICD-10-CM | POA: Diagnosis not present

## 2023-05-15 DIAGNOSIS — N1832 Chronic kidney disease, stage 3b: Secondary | ICD-10-CM | POA: Diagnosis not present

## 2023-05-15 DIAGNOSIS — G20A1 Parkinson's disease without dyskinesia, without mention of fluctuations: Secondary | ICD-10-CM | POA: Diagnosis not present

## 2023-05-15 DIAGNOSIS — F32A Depression, unspecified: Secondary | ICD-10-CM | POA: Diagnosis not present

## 2023-05-15 DIAGNOSIS — E785 Hyperlipidemia, unspecified: Secondary | ICD-10-CM | POA: Diagnosis not present

## 2023-05-15 DIAGNOSIS — D509 Iron deficiency anemia, unspecified: Secondary | ICD-10-CM | POA: Diagnosis not present

## 2023-05-16 DIAGNOSIS — D509 Iron deficiency anemia, unspecified: Secondary | ICD-10-CM | POA: Diagnosis not present

## 2023-05-16 DIAGNOSIS — G20A1 Parkinson's disease without dyskinesia, without mention of fluctuations: Secondary | ICD-10-CM | POA: Diagnosis not present

## 2023-05-16 DIAGNOSIS — I509 Heart failure, unspecified: Secondary | ICD-10-CM | POA: Diagnosis not present

## 2023-05-16 DIAGNOSIS — E785 Hyperlipidemia, unspecified: Secondary | ICD-10-CM | POA: Diagnosis not present

## 2023-05-16 DIAGNOSIS — F32A Depression, unspecified: Secondary | ICD-10-CM | POA: Diagnosis not present

## 2023-05-16 DIAGNOSIS — N1832 Chronic kidney disease, stage 3b: Secondary | ICD-10-CM | POA: Diagnosis not present

## 2023-05-17 DIAGNOSIS — I509 Heart failure, unspecified: Secondary | ICD-10-CM | POA: Diagnosis not present

## 2023-05-17 DIAGNOSIS — F32A Depression, unspecified: Secondary | ICD-10-CM | POA: Diagnosis not present

## 2023-05-17 DIAGNOSIS — N1832 Chronic kidney disease, stage 3b: Secondary | ICD-10-CM | POA: Diagnosis not present

## 2023-05-17 DIAGNOSIS — G20A1 Parkinson's disease without dyskinesia, without mention of fluctuations: Secondary | ICD-10-CM | POA: Diagnosis not present

## 2023-05-17 DIAGNOSIS — D509 Iron deficiency anemia, unspecified: Secondary | ICD-10-CM | POA: Diagnosis not present

## 2023-05-17 DIAGNOSIS — E785 Hyperlipidemia, unspecified: Secondary | ICD-10-CM | POA: Diagnosis not present

## 2023-05-18 DIAGNOSIS — N1832 Chronic kidney disease, stage 3b: Secondary | ICD-10-CM | POA: Diagnosis not present

## 2023-05-18 DIAGNOSIS — F32A Depression, unspecified: Secondary | ICD-10-CM | POA: Diagnosis not present

## 2023-05-18 DIAGNOSIS — D509 Iron deficiency anemia, unspecified: Secondary | ICD-10-CM | POA: Diagnosis not present

## 2023-05-18 DIAGNOSIS — G20A1 Parkinson's disease without dyskinesia, without mention of fluctuations: Secondary | ICD-10-CM | POA: Diagnosis not present

## 2023-05-18 DIAGNOSIS — I509 Heart failure, unspecified: Secondary | ICD-10-CM | POA: Diagnosis not present

## 2023-05-18 DIAGNOSIS — E785 Hyperlipidemia, unspecified: Secondary | ICD-10-CM | POA: Diagnosis not present

## 2023-05-19 DIAGNOSIS — E785 Hyperlipidemia, unspecified: Secondary | ICD-10-CM | POA: Diagnosis not present

## 2023-05-19 DIAGNOSIS — I509 Heart failure, unspecified: Secondary | ICD-10-CM | POA: Diagnosis not present

## 2023-05-19 DIAGNOSIS — F32A Depression, unspecified: Secondary | ICD-10-CM | POA: Diagnosis not present

## 2023-05-19 DIAGNOSIS — N1832 Chronic kidney disease, stage 3b: Secondary | ICD-10-CM | POA: Diagnosis not present

## 2023-05-19 DIAGNOSIS — G20A1 Parkinson's disease without dyskinesia, without mention of fluctuations: Secondary | ICD-10-CM | POA: Diagnosis not present

## 2023-05-19 DIAGNOSIS — D509 Iron deficiency anemia, unspecified: Secondary | ICD-10-CM | POA: Diagnosis not present

## 2023-05-20 DIAGNOSIS — I509 Heart failure, unspecified: Secondary | ICD-10-CM | POA: Diagnosis not present

## 2023-05-20 DIAGNOSIS — F32A Depression, unspecified: Secondary | ICD-10-CM | POA: Diagnosis not present

## 2023-05-20 DIAGNOSIS — G20A1 Parkinson's disease without dyskinesia, without mention of fluctuations: Secondary | ICD-10-CM | POA: Diagnosis not present

## 2023-05-20 DIAGNOSIS — N1832 Chronic kidney disease, stage 3b: Secondary | ICD-10-CM | POA: Diagnosis not present

## 2023-05-20 DIAGNOSIS — E785 Hyperlipidemia, unspecified: Secondary | ICD-10-CM | POA: Diagnosis not present

## 2023-05-20 DIAGNOSIS — D509 Iron deficiency anemia, unspecified: Secondary | ICD-10-CM | POA: Diagnosis not present

## 2023-05-21 DIAGNOSIS — I509 Heart failure, unspecified: Secondary | ICD-10-CM | POA: Diagnosis not present

## 2023-05-21 DIAGNOSIS — D509 Iron deficiency anemia, unspecified: Secondary | ICD-10-CM | POA: Diagnosis not present

## 2023-05-21 DIAGNOSIS — N1832 Chronic kidney disease, stage 3b: Secondary | ICD-10-CM | POA: Diagnosis not present

## 2023-05-21 DIAGNOSIS — F32A Depression, unspecified: Secondary | ICD-10-CM | POA: Diagnosis not present

## 2023-05-21 DIAGNOSIS — G20A1 Parkinson's disease without dyskinesia, without mention of fluctuations: Secondary | ICD-10-CM | POA: Diagnosis not present

## 2023-05-21 DIAGNOSIS — E785 Hyperlipidemia, unspecified: Secondary | ICD-10-CM | POA: Diagnosis not present

## 2023-05-22 DIAGNOSIS — N1832 Chronic kidney disease, stage 3b: Secondary | ICD-10-CM | POA: Diagnosis not present

## 2023-05-22 DIAGNOSIS — I509 Heart failure, unspecified: Secondary | ICD-10-CM | POA: Diagnosis not present

## 2023-05-22 DIAGNOSIS — G20A1 Parkinson's disease without dyskinesia, without mention of fluctuations: Secondary | ICD-10-CM | POA: Diagnosis not present

## 2023-05-22 DIAGNOSIS — E785 Hyperlipidemia, unspecified: Secondary | ICD-10-CM | POA: Diagnosis not present

## 2023-05-22 DIAGNOSIS — D509 Iron deficiency anemia, unspecified: Secondary | ICD-10-CM | POA: Diagnosis not present

## 2023-05-22 DIAGNOSIS — F32A Depression, unspecified: Secondary | ICD-10-CM | POA: Diagnosis not present

## 2023-05-23 DIAGNOSIS — G20A1 Parkinson's disease without dyskinesia, without mention of fluctuations: Secondary | ICD-10-CM | POA: Diagnosis not present

## 2023-05-23 DIAGNOSIS — E785 Hyperlipidemia, unspecified: Secondary | ICD-10-CM | POA: Diagnosis not present

## 2023-05-23 DIAGNOSIS — F32A Depression, unspecified: Secondary | ICD-10-CM | POA: Diagnosis not present

## 2023-05-23 DIAGNOSIS — I509 Heart failure, unspecified: Secondary | ICD-10-CM | POA: Diagnosis not present

## 2023-05-23 DIAGNOSIS — N1832 Chronic kidney disease, stage 3b: Secondary | ICD-10-CM | POA: Diagnosis not present

## 2023-05-23 DIAGNOSIS — D509 Iron deficiency anemia, unspecified: Secondary | ICD-10-CM | POA: Diagnosis not present

## 2023-05-24 DIAGNOSIS — I509 Heart failure, unspecified: Secondary | ICD-10-CM | POA: Diagnosis not present

## 2023-05-24 DIAGNOSIS — N1832 Chronic kidney disease, stage 3b: Secondary | ICD-10-CM | POA: Diagnosis not present

## 2023-05-24 DIAGNOSIS — G20A1 Parkinson's disease without dyskinesia, without mention of fluctuations: Secondary | ICD-10-CM | POA: Diagnosis not present

## 2023-05-24 DIAGNOSIS — E785 Hyperlipidemia, unspecified: Secondary | ICD-10-CM | POA: Diagnosis not present

## 2023-05-24 DIAGNOSIS — F32A Depression, unspecified: Secondary | ICD-10-CM | POA: Diagnosis not present

## 2023-05-24 DIAGNOSIS — D509 Iron deficiency anemia, unspecified: Secondary | ICD-10-CM | POA: Diagnosis not present

## 2023-05-25 DIAGNOSIS — I509 Heart failure, unspecified: Secondary | ICD-10-CM | POA: Diagnosis not present

## 2023-05-25 DIAGNOSIS — D509 Iron deficiency anemia, unspecified: Secondary | ICD-10-CM | POA: Diagnosis not present

## 2023-05-25 DIAGNOSIS — F32A Depression, unspecified: Secondary | ICD-10-CM | POA: Diagnosis not present

## 2023-05-25 DIAGNOSIS — N1832 Chronic kidney disease, stage 3b: Secondary | ICD-10-CM | POA: Diagnosis not present

## 2023-05-25 DIAGNOSIS — E785 Hyperlipidemia, unspecified: Secondary | ICD-10-CM | POA: Diagnosis not present

## 2023-05-25 DIAGNOSIS — G20A1 Parkinson's disease without dyskinesia, without mention of fluctuations: Secondary | ICD-10-CM | POA: Diagnosis not present

## 2023-05-26 DIAGNOSIS — G20A1 Parkinson's disease without dyskinesia, without mention of fluctuations: Secondary | ICD-10-CM | POA: Diagnosis not present

## 2023-05-26 DIAGNOSIS — I509 Heart failure, unspecified: Secondary | ICD-10-CM | POA: Diagnosis not present

## 2023-05-26 DIAGNOSIS — F32A Depression, unspecified: Secondary | ICD-10-CM | POA: Diagnosis not present

## 2023-05-26 DIAGNOSIS — N1832 Chronic kidney disease, stage 3b: Secondary | ICD-10-CM | POA: Diagnosis not present

## 2023-05-26 DIAGNOSIS — E785 Hyperlipidemia, unspecified: Secondary | ICD-10-CM | POA: Diagnosis not present

## 2023-05-26 DIAGNOSIS — D509 Iron deficiency anemia, unspecified: Secondary | ICD-10-CM | POA: Diagnosis not present

## 2023-05-27 DIAGNOSIS — N1832 Chronic kidney disease, stage 3b: Secondary | ICD-10-CM | POA: Diagnosis not present

## 2023-05-27 DIAGNOSIS — G20A1 Parkinson's disease without dyskinesia, without mention of fluctuations: Secondary | ICD-10-CM | POA: Diagnosis not present

## 2023-05-27 DIAGNOSIS — I509 Heart failure, unspecified: Secondary | ICD-10-CM | POA: Diagnosis not present

## 2023-05-27 DIAGNOSIS — E785 Hyperlipidemia, unspecified: Secondary | ICD-10-CM | POA: Diagnosis not present

## 2023-05-27 DIAGNOSIS — D509 Iron deficiency anemia, unspecified: Secondary | ICD-10-CM | POA: Diagnosis not present

## 2023-05-27 DIAGNOSIS — F32A Depression, unspecified: Secondary | ICD-10-CM | POA: Diagnosis not present

## 2023-05-28 DIAGNOSIS — G20A1 Parkinson's disease without dyskinesia, without mention of fluctuations: Secondary | ICD-10-CM | POA: Diagnosis not present

## 2023-05-28 DIAGNOSIS — E785 Hyperlipidemia, unspecified: Secondary | ICD-10-CM | POA: Diagnosis not present

## 2023-05-28 DIAGNOSIS — N1832 Chronic kidney disease, stage 3b: Secondary | ICD-10-CM | POA: Diagnosis not present

## 2023-05-28 DIAGNOSIS — I509 Heart failure, unspecified: Secondary | ICD-10-CM | POA: Diagnosis not present

## 2023-05-28 DIAGNOSIS — D509 Iron deficiency anemia, unspecified: Secondary | ICD-10-CM | POA: Diagnosis not present

## 2023-05-28 DIAGNOSIS — F32A Depression, unspecified: Secondary | ICD-10-CM | POA: Diagnosis not present

## 2023-05-29 DIAGNOSIS — E785 Hyperlipidemia, unspecified: Secondary | ICD-10-CM | POA: Diagnosis not present

## 2023-05-29 DIAGNOSIS — F32A Depression, unspecified: Secondary | ICD-10-CM | POA: Diagnosis not present

## 2023-05-29 DIAGNOSIS — I509 Heart failure, unspecified: Secondary | ICD-10-CM | POA: Diagnosis not present

## 2023-05-29 DIAGNOSIS — N1832 Chronic kidney disease, stage 3b: Secondary | ICD-10-CM | POA: Diagnosis not present

## 2023-05-29 DIAGNOSIS — M810 Age-related osteoporosis without current pathological fracture: Secondary | ICD-10-CM | POA: Diagnosis not present

## 2023-05-29 DIAGNOSIS — L309 Dermatitis, unspecified: Secondary | ICD-10-CM | POA: Diagnosis not present

## 2023-05-29 DIAGNOSIS — G20A1 Parkinson's disease without dyskinesia, without mention of fluctuations: Secondary | ICD-10-CM | POA: Diagnosis not present

## 2023-05-29 DIAGNOSIS — K219 Gastro-esophageal reflux disease without esophagitis: Secondary | ICD-10-CM | POA: Diagnosis not present

## 2023-05-29 DIAGNOSIS — D509 Iron deficiency anemia, unspecified: Secondary | ICD-10-CM | POA: Diagnosis not present

## 2023-06-01 DIAGNOSIS — I509 Heart failure, unspecified: Secondary | ICD-10-CM | POA: Diagnosis not present

## 2023-06-01 DIAGNOSIS — F32A Depression, unspecified: Secondary | ICD-10-CM | POA: Diagnosis not present

## 2023-06-01 DIAGNOSIS — D509 Iron deficiency anemia, unspecified: Secondary | ICD-10-CM | POA: Diagnosis not present

## 2023-06-01 DIAGNOSIS — E785 Hyperlipidemia, unspecified: Secondary | ICD-10-CM | POA: Diagnosis not present

## 2023-06-01 DIAGNOSIS — G20A1 Parkinson's disease without dyskinesia, without mention of fluctuations: Secondary | ICD-10-CM | POA: Diagnosis not present

## 2023-06-01 DIAGNOSIS — N1832 Chronic kidney disease, stage 3b: Secondary | ICD-10-CM | POA: Diagnosis not present

## 2023-06-03 DIAGNOSIS — E785 Hyperlipidemia, unspecified: Secondary | ICD-10-CM | POA: Diagnosis not present

## 2023-06-03 DIAGNOSIS — F32A Depression, unspecified: Secondary | ICD-10-CM | POA: Diagnosis not present

## 2023-06-03 DIAGNOSIS — G20A1 Parkinson's disease without dyskinesia, without mention of fluctuations: Secondary | ICD-10-CM | POA: Diagnosis not present

## 2023-06-03 DIAGNOSIS — D509 Iron deficiency anemia, unspecified: Secondary | ICD-10-CM | POA: Diagnosis not present

## 2023-06-03 DIAGNOSIS — N1832 Chronic kidney disease, stage 3b: Secondary | ICD-10-CM | POA: Diagnosis not present

## 2023-06-03 DIAGNOSIS — I509 Heart failure, unspecified: Secondary | ICD-10-CM | POA: Diagnosis not present

## 2023-06-04 DIAGNOSIS — D509 Iron deficiency anemia, unspecified: Secondary | ICD-10-CM | POA: Diagnosis not present

## 2023-06-04 DIAGNOSIS — N1832 Chronic kidney disease, stage 3b: Secondary | ICD-10-CM | POA: Diagnosis not present

## 2023-06-04 DIAGNOSIS — E785 Hyperlipidemia, unspecified: Secondary | ICD-10-CM | POA: Diagnosis not present

## 2023-06-04 DIAGNOSIS — G20A1 Parkinson's disease without dyskinesia, without mention of fluctuations: Secondary | ICD-10-CM | POA: Diagnosis not present

## 2023-06-04 DIAGNOSIS — I509 Heart failure, unspecified: Secondary | ICD-10-CM | POA: Diagnosis not present

## 2023-06-04 DIAGNOSIS — F32A Depression, unspecified: Secondary | ICD-10-CM | POA: Diagnosis not present

## 2023-06-10 DIAGNOSIS — F32A Depression, unspecified: Secondary | ICD-10-CM | POA: Diagnosis not present

## 2023-06-10 DIAGNOSIS — E785 Hyperlipidemia, unspecified: Secondary | ICD-10-CM | POA: Diagnosis not present

## 2023-06-10 DIAGNOSIS — I509 Heart failure, unspecified: Secondary | ICD-10-CM | POA: Diagnosis not present

## 2023-06-10 DIAGNOSIS — N1832 Chronic kidney disease, stage 3b: Secondary | ICD-10-CM | POA: Diagnosis not present

## 2023-06-10 DIAGNOSIS — D509 Iron deficiency anemia, unspecified: Secondary | ICD-10-CM | POA: Diagnosis not present

## 2023-06-10 DIAGNOSIS — G20A1 Parkinson's disease without dyskinesia, without mention of fluctuations: Secondary | ICD-10-CM | POA: Diagnosis not present

## 2023-06-12 DIAGNOSIS — E785 Hyperlipidemia, unspecified: Secondary | ICD-10-CM | POA: Diagnosis not present

## 2023-06-12 DIAGNOSIS — N1832 Chronic kidney disease, stage 3b: Secondary | ICD-10-CM | POA: Diagnosis not present

## 2023-06-12 DIAGNOSIS — I509 Heart failure, unspecified: Secondary | ICD-10-CM | POA: Diagnosis not present

## 2023-06-12 DIAGNOSIS — D509 Iron deficiency anemia, unspecified: Secondary | ICD-10-CM | POA: Diagnosis not present

## 2023-06-12 DIAGNOSIS — G20A1 Parkinson's disease without dyskinesia, without mention of fluctuations: Secondary | ICD-10-CM | POA: Diagnosis not present

## 2023-06-12 DIAGNOSIS — F32A Depression, unspecified: Secondary | ICD-10-CM | POA: Diagnosis not present

## 2023-06-13 DIAGNOSIS — G20A1 Parkinson's disease without dyskinesia, without mention of fluctuations: Secondary | ICD-10-CM | POA: Diagnosis not present

## 2023-06-13 DIAGNOSIS — N1832 Chronic kidney disease, stage 3b: Secondary | ICD-10-CM | POA: Diagnosis not present

## 2023-06-13 DIAGNOSIS — E785 Hyperlipidemia, unspecified: Secondary | ICD-10-CM | POA: Diagnosis not present

## 2023-06-13 DIAGNOSIS — I509 Heart failure, unspecified: Secondary | ICD-10-CM | POA: Diagnosis not present

## 2023-06-13 DIAGNOSIS — F32A Depression, unspecified: Secondary | ICD-10-CM | POA: Diagnosis not present

## 2023-06-13 DIAGNOSIS — D509 Iron deficiency anemia, unspecified: Secondary | ICD-10-CM | POA: Diagnosis not present

## 2023-06-15 DIAGNOSIS — E785 Hyperlipidemia, unspecified: Secondary | ICD-10-CM | POA: Diagnosis not present

## 2023-06-15 DIAGNOSIS — G20A1 Parkinson's disease without dyskinesia, without mention of fluctuations: Secondary | ICD-10-CM | POA: Diagnosis not present

## 2023-06-15 DIAGNOSIS — I509 Heart failure, unspecified: Secondary | ICD-10-CM | POA: Diagnosis not present

## 2023-06-15 DIAGNOSIS — N1832 Chronic kidney disease, stage 3b: Secondary | ICD-10-CM | POA: Diagnosis not present

## 2023-06-15 DIAGNOSIS — D509 Iron deficiency anemia, unspecified: Secondary | ICD-10-CM | POA: Diagnosis not present

## 2023-06-15 DIAGNOSIS — F32A Depression, unspecified: Secondary | ICD-10-CM | POA: Diagnosis not present

## 2023-06-17 DIAGNOSIS — G20A1 Parkinson's disease without dyskinesia, without mention of fluctuations: Secondary | ICD-10-CM | POA: Diagnosis not present

## 2023-06-17 DIAGNOSIS — D509 Iron deficiency anemia, unspecified: Secondary | ICD-10-CM | POA: Diagnosis not present

## 2023-06-17 DIAGNOSIS — I509 Heart failure, unspecified: Secondary | ICD-10-CM | POA: Diagnosis not present

## 2023-06-17 DIAGNOSIS — E785 Hyperlipidemia, unspecified: Secondary | ICD-10-CM | POA: Diagnosis not present

## 2023-06-17 DIAGNOSIS — N1832 Chronic kidney disease, stage 3b: Secondary | ICD-10-CM | POA: Diagnosis not present

## 2023-06-17 DIAGNOSIS — F32A Depression, unspecified: Secondary | ICD-10-CM | POA: Diagnosis not present

## 2023-06-19 DIAGNOSIS — G20A1 Parkinson's disease without dyskinesia, without mention of fluctuations: Secondary | ICD-10-CM | POA: Diagnosis not present

## 2023-06-19 DIAGNOSIS — F32A Depression, unspecified: Secondary | ICD-10-CM | POA: Diagnosis not present

## 2023-06-19 DIAGNOSIS — E785 Hyperlipidemia, unspecified: Secondary | ICD-10-CM | POA: Diagnosis not present

## 2023-06-19 DIAGNOSIS — D509 Iron deficiency anemia, unspecified: Secondary | ICD-10-CM | POA: Diagnosis not present

## 2023-06-19 DIAGNOSIS — N1832 Chronic kidney disease, stage 3b: Secondary | ICD-10-CM | POA: Diagnosis not present

## 2023-06-19 DIAGNOSIS — I509 Heart failure, unspecified: Secondary | ICD-10-CM | POA: Diagnosis not present

## 2023-06-20 DIAGNOSIS — N1832 Chronic kidney disease, stage 3b: Secondary | ICD-10-CM | POA: Diagnosis not present

## 2023-06-20 DIAGNOSIS — I509 Heart failure, unspecified: Secondary | ICD-10-CM | POA: Diagnosis not present

## 2023-06-20 DIAGNOSIS — G20A1 Parkinson's disease without dyskinesia, without mention of fluctuations: Secondary | ICD-10-CM | POA: Diagnosis not present

## 2023-06-20 DIAGNOSIS — D509 Iron deficiency anemia, unspecified: Secondary | ICD-10-CM | POA: Diagnosis not present

## 2023-06-20 DIAGNOSIS — F32A Depression, unspecified: Secondary | ICD-10-CM | POA: Diagnosis not present

## 2023-06-20 DIAGNOSIS — E785 Hyperlipidemia, unspecified: Secondary | ICD-10-CM | POA: Diagnosis not present

## 2023-06-24 DIAGNOSIS — G20A1 Parkinson's disease without dyskinesia, without mention of fluctuations: Secondary | ICD-10-CM | POA: Diagnosis not present

## 2023-06-24 DIAGNOSIS — F32A Depression, unspecified: Secondary | ICD-10-CM | POA: Diagnosis not present

## 2023-06-24 DIAGNOSIS — N1832 Chronic kidney disease, stage 3b: Secondary | ICD-10-CM | POA: Diagnosis not present

## 2023-06-24 DIAGNOSIS — I509 Heart failure, unspecified: Secondary | ICD-10-CM | POA: Diagnosis not present

## 2023-06-24 DIAGNOSIS — E785 Hyperlipidemia, unspecified: Secondary | ICD-10-CM | POA: Diagnosis not present

## 2023-06-24 DIAGNOSIS — D509 Iron deficiency anemia, unspecified: Secondary | ICD-10-CM | POA: Diagnosis not present

## 2023-06-26 DIAGNOSIS — G20A1 Parkinson's disease without dyskinesia, without mention of fluctuations: Secondary | ICD-10-CM | POA: Diagnosis not present

## 2023-06-26 DIAGNOSIS — F32A Depression, unspecified: Secondary | ICD-10-CM | POA: Diagnosis not present

## 2023-06-26 DIAGNOSIS — E785 Hyperlipidemia, unspecified: Secondary | ICD-10-CM | POA: Diagnosis not present

## 2023-06-26 DIAGNOSIS — N1832 Chronic kidney disease, stage 3b: Secondary | ICD-10-CM | POA: Diagnosis not present

## 2023-06-26 DIAGNOSIS — I509 Heart failure, unspecified: Secondary | ICD-10-CM | POA: Diagnosis not present

## 2023-06-26 DIAGNOSIS — D509 Iron deficiency anemia, unspecified: Secondary | ICD-10-CM | POA: Diagnosis not present

## 2023-06-27 DIAGNOSIS — N1832 Chronic kidney disease, stage 3b: Secondary | ICD-10-CM | POA: Diagnosis not present

## 2023-06-27 DIAGNOSIS — F32A Depression, unspecified: Secondary | ICD-10-CM | POA: Diagnosis not present

## 2023-06-27 DIAGNOSIS — G20A1 Parkinson's disease without dyskinesia, without mention of fluctuations: Secondary | ICD-10-CM | POA: Diagnosis not present

## 2023-06-27 DIAGNOSIS — I509 Heart failure, unspecified: Secondary | ICD-10-CM | POA: Diagnosis not present

## 2023-06-27 DIAGNOSIS — D509 Iron deficiency anemia, unspecified: Secondary | ICD-10-CM | POA: Diagnosis not present

## 2023-06-27 DIAGNOSIS — E785 Hyperlipidemia, unspecified: Secondary | ICD-10-CM | POA: Diagnosis not present

## 2023-06-28 DIAGNOSIS — E785 Hyperlipidemia, unspecified: Secondary | ICD-10-CM | POA: Diagnosis not present

## 2023-06-28 DIAGNOSIS — F32A Depression, unspecified: Secondary | ICD-10-CM | POA: Diagnosis not present

## 2023-06-28 DIAGNOSIS — K219 Gastro-esophageal reflux disease without esophagitis: Secondary | ICD-10-CM | POA: Diagnosis not present

## 2023-06-28 DIAGNOSIS — M810 Age-related osteoporosis without current pathological fracture: Secondary | ICD-10-CM | POA: Diagnosis not present

## 2023-06-28 DIAGNOSIS — L309 Dermatitis, unspecified: Secondary | ICD-10-CM | POA: Diagnosis not present

## 2023-06-28 DIAGNOSIS — G20A1 Parkinson's disease without dyskinesia, without mention of fluctuations: Secondary | ICD-10-CM | POA: Diagnosis not present

## 2023-06-28 DIAGNOSIS — N1832 Chronic kidney disease, stage 3b: Secondary | ICD-10-CM | POA: Diagnosis not present

## 2023-06-28 DIAGNOSIS — D509 Iron deficiency anemia, unspecified: Secondary | ICD-10-CM | POA: Diagnosis not present

## 2023-06-28 DIAGNOSIS — I509 Heart failure, unspecified: Secondary | ICD-10-CM | POA: Diagnosis not present

## 2023-07-01 DIAGNOSIS — I509 Heart failure, unspecified: Secondary | ICD-10-CM | POA: Diagnosis not present

## 2023-07-01 DIAGNOSIS — D509 Iron deficiency anemia, unspecified: Secondary | ICD-10-CM | POA: Diagnosis not present

## 2023-07-01 DIAGNOSIS — F32A Depression, unspecified: Secondary | ICD-10-CM | POA: Diagnosis not present

## 2023-07-01 DIAGNOSIS — G20A1 Parkinson's disease without dyskinesia, without mention of fluctuations: Secondary | ICD-10-CM | POA: Diagnosis not present

## 2023-07-01 DIAGNOSIS — N1832 Chronic kidney disease, stage 3b: Secondary | ICD-10-CM | POA: Diagnosis not present

## 2023-07-01 DIAGNOSIS — E785 Hyperlipidemia, unspecified: Secondary | ICD-10-CM | POA: Diagnosis not present

## 2023-07-03 DIAGNOSIS — G20A1 Parkinson's disease without dyskinesia, without mention of fluctuations: Secondary | ICD-10-CM | POA: Diagnosis not present

## 2023-07-03 DIAGNOSIS — E785 Hyperlipidemia, unspecified: Secondary | ICD-10-CM | POA: Diagnosis not present

## 2023-07-03 DIAGNOSIS — N1832 Chronic kidney disease, stage 3b: Secondary | ICD-10-CM | POA: Diagnosis not present

## 2023-07-03 DIAGNOSIS — F32A Depression, unspecified: Secondary | ICD-10-CM | POA: Diagnosis not present

## 2023-07-03 DIAGNOSIS — D509 Iron deficiency anemia, unspecified: Secondary | ICD-10-CM | POA: Diagnosis not present

## 2023-07-03 DIAGNOSIS — I509 Heart failure, unspecified: Secondary | ICD-10-CM | POA: Diagnosis not present

## 2023-07-04 DIAGNOSIS — N1832 Chronic kidney disease, stage 3b: Secondary | ICD-10-CM | POA: Diagnosis not present

## 2023-07-04 DIAGNOSIS — E785 Hyperlipidemia, unspecified: Secondary | ICD-10-CM | POA: Diagnosis not present

## 2023-07-04 DIAGNOSIS — F32A Depression, unspecified: Secondary | ICD-10-CM | POA: Diagnosis not present

## 2023-07-04 DIAGNOSIS — D509 Iron deficiency anemia, unspecified: Secondary | ICD-10-CM | POA: Diagnosis not present

## 2023-07-04 DIAGNOSIS — I509 Heart failure, unspecified: Secondary | ICD-10-CM | POA: Diagnosis not present

## 2023-07-04 DIAGNOSIS — G20A1 Parkinson's disease without dyskinesia, without mention of fluctuations: Secondary | ICD-10-CM | POA: Diagnosis not present

## 2023-07-08 DIAGNOSIS — I509 Heart failure, unspecified: Secondary | ICD-10-CM | POA: Diagnosis not present

## 2023-07-08 DIAGNOSIS — N1832 Chronic kidney disease, stage 3b: Secondary | ICD-10-CM | POA: Diagnosis not present

## 2023-07-08 DIAGNOSIS — E785 Hyperlipidemia, unspecified: Secondary | ICD-10-CM | POA: Diagnosis not present

## 2023-07-08 DIAGNOSIS — G20A1 Parkinson's disease without dyskinesia, without mention of fluctuations: Secondary | ICD-10-CM | POA: Diagnosis not present

## 2023-07-08 DIAGNOSIS — D509 Iron deficiency anemia, unspecified: Secondary | ICD-10-CM | POA: Diagnosis not present

## 2023-07-08 DIAGNOSIS — F32A Depression, unspecified: Secondary | ICD-10-CM | POA: Diagnosis not present

## 2023-07-09 DIAGNOSIS — N390 Urinary tract infection, site not specified: Secondary | ICD-10-CM | POA: Diagnosis not present

## 2023-07-11 DIAGNOSIS — F32A Depression, unspecified: Secondary | ICD-10-CM | POA: Diagnosis not present

## 2023-07-11 DIAGNOSIS — G20A1 Parkinson's disease without dyskinesia, without mention of fluctuations: Secondary | ICD-10-CM | POA: Diagnosis not present

## 2023-07-11 DIAGNOSIS — E785 Hyperlipidemia, unspecified: Secondary | ICD-10-CM | POA: Diagnosis not present

## 2023-07-11 DIAGNOSIS — N1832 Chronic kidney disease, stage 3b: Secondary | ICD-10-CM | POA: Diagnosis not present

## 2023-07-11 DIAGNOSIS — D509 Iron deficiency anemia, unspecified: Secondary | ICD-10-CM | POA: Diagnosis not present

## 2023-07-11 DIAGNOSIS — I509 Heart failure, unspecified: Secondary | ICD-10-CM | POA: Diagnosis not present

## 2023-07-14 DIAGNOSIS — E785 Hyperlipidemia, unspecified: Secondary | ICD-10-CM | POA: Diagnosis not present

## 2023-07-14 DIAGNOSIS — N1832 Chronic kidney disease, stage 3b: Secondary | ICD-10-CM | POA: Diagnosis not present

## 2023-07-14 DIAGNOSIS — I509 Heart failure, unspecified: Secondary | ICD-10-CM | POA: Diagnosis not present

## 2023-07-14 DIAGNOSIS — D509 Iron deficiency anemia, unspecified: Secondary | ICD-10-CM | POA: Diagnosis not present

## 2023-07-14 DIAGNOSIS — G20A1 Parkinson's disease without dyskinesia, without mention of fluctuations: Secondary | ICD-10-CM | POA: Diagnosis not present

## 2023-07-14 DIAGNOSIS — F32A Depression, unspecified: Secondary | ICD-10-CM | POA: Diagnosis not present

## 2023-07-15 DIAGNOSIS — F32A Depression, unspecified: Secondary | ICD-10-CM | POA: Diagnosis not present

## 2023-07-15 DIAGNOSIS — E785 Hyperlipidemia, unspecified: Secondary | ICD-10-CM | POA: Diagnosis not present

## 2023-07-15 DIAGNOSIS — I509 Heart failure, unspecified: Secondary | ICD-10-CM | POA: Diagnosis not present

## 2023-07-15 DIAGNOSIS — N1832 Chronic kidney disease, stage 3b: Secondary | ICD-10-CM | POA: Diagnosis not present

## 2023-07-15 DIAGNOSIS — D509 Iron deficiency anemia, unspecified: Secondary | ICD-10-CM | POA: Diagnosis not present

## 2023-07-15 DIAGNOSIS — G20A1 Parkinson's disease without dyskinesia, without mention of fluctuations: Secondary | ICD-10-CM | POA: Diagnosis not present

## 2023-07-18 DIAGNOSIS — F32A Depression, unspecified: Secondary | ICD-10-CM | POA: Diagnosis not present

## 2023-07-18 DIAGNOSIS — I509 Heart failure, unspecified: Secondary | ICD-10-CM | POA: Diagnosis not present

## 2023-07-18 DIAGNOSIS — G20A1 Parkinson's disease without dyskinesia, without mention of fluctuations: Secondary | ICD-10-CM | POA: Diagnosis not present

## 2023-07-18 DIAGNOSIS — E785 Hyperlipidemia, unspecified: Secondary | ICD-10-CM | POA: Diagnosis not present

## 2023-07-18 DIAGNOSIS — D509 Iron deficiency anemia, unspecified: Secondary | ICD-10-CM | POA: Diagnosis not present

## 2023-07-18 DIAGNOSIS — N1832 Chronic kidney disease, stage 3b: Secondary | ICD-10-CM | POA: Diagnosis not present

## 2023-07-20 DIAGNOSIS — I509 Heart failure, unspecified: Secondary | ICD-10-CM | POA: Diagnosis not present

## 2023-07-20 DIAGNOSIS — D509 Iron deficiency anemia, unspecified: Secondary | ICD-10-CM | POA: Diagnosis not present

## 2023-07-20 DIAGNOSIS — F32A Depression, unspecified: Secondary | ICD-10-CM | POA: Diagnosis not present

## 2023-07-20 DIAGNOSIS — N1832 Chronic kidney disease, stage 3b: Secondary | ICD-10-CM | POA: Diagnosis not present

## 2023-07-20 DIAGNOSIS — E785 Hyperlipidemia, unspecified: Secondary | ICD-10-CM | POA: Diagnosis not present

## 2023-07-20 DIAGNOSIS — G20A1 Parkinson's disease without dyskinesia, without mention of fluctuations: Secondary | ICD-10-CM | POA: Diagnosis not present

## 2023-07-22 DIAGNOSIS — I509 Heart failure, unspecified: Secondary | ICD-10-CM | POA: Diagnosis not present

## 2023-07-22 DIAGNOSIS — D509 Iron deficiency anemia, unspecified: Secondary | ICD-10-CM | POA: Diagnosis not present

## 2023-07-22 DIAGNOSIS — G20A1 Parkinson's disease without dyskinesia, without mention of fluctuations: Secondary | ICD-10-CM | POA: Diagnosis not present

## 2023-07-22 DIAGNOSIS — N1832 Chronic kidney disease, stage 3b: Secondary | ICD-10-CM | POA: Diagnosis not present

## 2023-07-22 DIAGNOSIS — E785 Hyperlipidemia, unspecified: Secondary | ICD-10-CM | POA: Diagnosis not present

## 2023-07-22 DIAGNOSIS — F32A Depression, unspecified: Secondary | ICD-10-CM | POA: Diagnosis not present

## 2023-07-24 DIAGNOSIS — I509 Heart failure, unspecified: Secondary | ICD-10-CM | POA: Diagnosis not present

## 2023-07-24 DIAGNOSIS — N1832 Chronic kidney disease, stage 3b: Secondary | ICD-10-CM | POA: Diagnosis not present

## 2023-07-24 DIAGNOSIS — E785 Hyperlipidemia, unspecified: Secondary | ICD-10-CM | POA: Diagnosis not present

## 2023-07-24 DIAGNOSIS — D509 Iron deficiency anemia, unspecified: Secondary | ICD-10-CM | POA: Diagnosis not present

## 2023-07-24 DIAGNOSIS — F32A Depression, unspecified: Secondary | ICD-10-CM | POA: Diagnosis not present

## 2023-07-24 DIAGNOSIS — G20A1 Parkinson's disease without dyskinesia, without mention of fluctuations: Secondary | ICD-10-CM | POA: Diagnosis not present

## 2023-07-25 DIAGNOSIS — G20A1 Parkinson's disease without dyskinesia, without mention of fluctuations: Secondary | ICD-10-CM | POA: Diagnosis not present

## 2023-07-25 DIAGNOSIS — D509 Iron deficiency anemia, unspecified: Secondary | ICD-10-CM | POA: Diagnosis not present

## 2023-07-25 DIAGNOSIS — I509 Heart failure, unspecified: Secondary | ICD-10-CM | POA: Diagnosis not present

## 2023-07-25 DIAGNOSIS — N1832 Chronic kidney disease, stage 3b: Secondary | ICD-10-CM | POA: Diagnosis not present

## 2023-07-25 DIAGNOSIS — F32A Depression, unspecified: Secondary | ICD-10-CM | POA: Diagnosis not present

## 2023-07-25 DIAGNOSIS — E785 Hyperlipidemia, unspecified: Secondary | ICD-10-CM | POA: Diagnosis not present

## 2023-07-31 ENCOUNTER — Ambulatory Visit (INDEPENDENT_AMBULATORY_CARE_PROVIDER_SITE_OTHER): Payer: Medicare Other

## 2023-07-31 DIAGNOSIS — I495 Sick sinus syndrome: Secondary | ICD-10-CM | POA: Diagnosis not present

## 2023-07-31 LAB — CUP PACEART REMOTE DEVICE CHECK
Battery Remaining Longevity: 90 mo
Battery Remaining Percentage: 87 %
Brady Statistic RA Percent Paced: 54 %
Brady Statistic RV Percent Paced: 1 %
Date Time Interrogation Session: 20250103030100
Lead Channel Impedance Value: 1772 Ohm
Lead Channel Impedance Value: 513 Ohm
Lead Channel Setting Pacing Amplitude: 2 V
Lead Channel Setting Pacing Amplitude: 2.6 V
Lead Channel Setting Pacing Pulse Width: 1 ms
Lead Channel Setting Sensing Sensitivity: 2.5 mV
Pulse Gen Serial Number: 766467
Zone Setting Status: 755011

## 2023-09-08 NOTE — Progress Notes (Signed)
Remote pacemaker transmission.

## 2023-10-30 ENCOUNTER — Ambulatory Visit (INDEPENDENT_AMBULATORY_CARE_PROVIDER_SITE_OTHER): Payer: Medicare Other

## 2023-10-30 DIAGNOSIS — I495 Sick sinus syndrome: Secondary | ICD-10-CM

## 2023-10-31 LAB — CUP PACEART REMOTE DEVICE CHECK
Battery Remaining Longevity: 90 mo
Battery Remaining Percentage: 87 %
Brady Statistic RA Percent Paced: 55 %
Brady Statistic RV Percent Paced: 1 %
Date Time Interrogation Session: 20250404030100
Lead Channel Impedance Value: 1875 Ohm
Lead Channel Impedance Value: 579 Ohm
Lead Channel Setting Pacing Amplitude: 2 V
Lead Channel Setting Pacing Amplitude: 2.6 V
Lead Channel Setting Pacing Pulse Width: 1 ms
Lead Channel Setting Sensing Sensitivity: 2.5 mV
Pulse Gen Serial Number: 766467
Zone Setting Status: 755011

## 2023-11-01 ENCOUNTER — Encounter: Payer: Self-pay | Admitting: Internal Medicine

## 2023-11-02 ENCOUNTER — Ambulatory Visit (INDEPENDENT_AMBULATORY_CARE_PROVIDER_SITE_OTHER): Payer: Medicare Other

## 2023-11-02 DIAGNOSIS — I495 Sick sinus syndrome: Secondary | ICD-10-CM

## 2023-11-13 ENCOUNTER — Telehealth: Payer: Self-pay | Admitting: Internal Medicine

## 2023-11-13 NOTE — Telephone Encounter (Signed)
 Daughter stated patient's device showed an error code called to check if transmission was received.  Daughter also given [employer] company number.

## 2023-11-13 NOTE — Telephone Encounter (Signed)
 Tried calling daughter back LVM for her to call DC letting her know we didn't get that transmission and to call tech support if after [phone]

## 2023-12-07 NOTE — Progress Notes (Signed)
 Remote pacemaker transmission.

## 2023-12-07 NOTE — Addendum Note (Signed)
 Addended by: Lott Rouleau A on: 12/07/2023 11:05 AM   Modules accepted: Orders

## 2023-12-16 NOTE — Progress Notes (Signed)
 Remote pacemaker transmission.

## 2024-01-18 ENCOUNTER — Telehealth: Payer: Self-pay | Admitting: Neurology

## 2024-01-18 NOTE — Telephone Encounter (Signed)
 Patient daughter states that the patient gets headaches and it hurts in the neck as well and wants to know if this is from the Parkinson

## 2024-01-18 NOTE — Telephone Encounter (Signed)
 Found in the last notes for this patient from 04/14/23 Suspect rebound headache             She is getting scheduled tramadol 3 times per day.  I think that is likely causing rebound headache.  I would recommend that we actually get rid of the tramadol and not use it more than 2-3 days/week for headache.  We discussed that headache will first increase before it decreases, but otherwise we are just going to be in a vicious cycle.

## 2024-01-20 NOTE — Telephone Encounter (Signed)
 Hospice nurse [facility]

## 2024-01-20 NOTE — Telephone Encounter (Signed)
Called patient's daughter and answered questions.

## 2024-02-01 ENCOUNTER — Ambulatory Visit (INDEPENDENT_AMBULATORY_CARE_PROVIDER_SITE_OTHER): Payer: Medicare Other

## 2024-02-01 DIAGNOSIS — I495 Sick sinus syndrome: Secondary | ICD-10-CM

## 2024-02-02 LAB — CUP PACEART REMOTE DEVICE CHECK
Battery Remaining Longevity: 84 mo
Battery Remaining Percentage: 80 %
Brady Statistic RA Percent Paced: 59 %
Brady Statistic RV Percent Paced: 0 %
Date Time Interrogation Session: 20250707031400
Lead Channel Impedance Value: 1899 Ohm
Lead Channel Impedance Value: 616 Ohm
Lead Channel Setting Pacing Amplitude: 2 V
Lead Channel Setting Pacing Amplitude: 2.6 V
Lead Channel Setting Pacing Pulse Width: 1 ms
Lead Channel Setting Sensing Sensitivity: 2.5 mV
Pulse Gen Serial Number: 766467
Zone Setting Status: 755011

## 2024-02-04 ENCOUNTER — Ambulatory Visit: Payer: Self-pay | Admitting: Internal Medicine

## 2024-05-02 ENCOUNTER — Ambulatory Visit (INDEPENDENT_AMBULATORY_CARE_PROVIDER_SITE_OTHER): Payer: Medicare Other

## 2024-05-02 DIAGNOSIS — I495 Sick sinus syndrome: Secondary | ICD-10-CM

## 2024-05-06 LAB — CUP PACEART REMOTE DEVICE CHECK
Battery Remaining Longevity: 84 mo
Battery Remaining Percentage: 82 %
Brady Statistic RA Percent Paced: 62 %
Brady Statistic RV Percent Paced: 0 %
Date Time Interrogation Session: 20251010030100
Lead Channel Impedance Value: 1805 Ohm
Lead Channel Impedance Value: 523 Ohm
Lead Channel Setting Pacing Amplitude: 2 V
Lead Channel Setting Pacing Amplitude: 2.6 V
Lead Channel Setting Pacing Pulse Width: 1 ms
Lead Channel Setting Sensing Sensitivity: 2.5 mV
Pulse Gen Serial Number: 766467
Zone Setting Status: 755011

## 2024-05-06 NOTE — Progress Notes (Signed)
 Remote PPM Transmission

## 2024-05-09 NOTE — Progress Notes (Signed)
 Remote PPM Transmission

## 2024-05-11 ENCOUNTER — Ambulatory Visit: Payer: Self-pay | Admitting: Internal Medicine

## 2024-06-15 ENCOUNTER — Ambulatory Visit: Payer: Medicare Other | Admitting: Family Medicine

## 2024-08-01 ENCOUNTER — Ambulatory Visit (INDEPENDENT_AMBULATORY_CARE_PROVIDER_SITE_OTHER): Payer: Medicare Other

## 2024-08-01 DIAGNOSIS — I495 Sick sinus syndrome: Secondary | ICD-10-CM | POA: Diagnosis not present

## 2024-08-02 LAB — CUP PACEART REMOTE DEVICE CHECK
Battery Remaining Longevity: 78 mo
Battery Remaining Percentage: 80 %
Brady Statistic RA Percent Paced: 52 %
Brady Statistic RV Percent Paced: 0 %
Date Time Interrogation Session: 20260105030000
Lead Channel Impedance Value: 1689 Ohm
Lead Channel Impedance Value: 499 Ohm
Lead Channel Setting Pacing Amplitude: 2 V
Lead Channel Setting Pacing Amplitude: 2.6 V
Lead Channel Setting Pacing Pulse Width: 1 ms
Lead Channel Setting Sensing Sensitivity: 2.5 mV
Pulse Gen Serial Number: 766467
Zone Setting Status: 755011

## 2024-08-04 NOTE — Progress Notes (Signed)
 Remote PPM Transmission

## 2024-08-06 ENCOUNTER — Ambulatory Visit: Payer: Self-pay | Admitting: Cardiovascular Disease

## 2024-08-31 ENCOUNTER — Ambulatory Visit: Admitting: Neurology
